# Patient Record
Sex: Male | Born: 1955 | Race: Black or African American | Hispanic: No | Smoking: Former smoker
Health system: Southern US, Community
[De-identification: ages and names within clinical notes are randomized; demographics above are authoritative.]

## PROBLEM LIST (undated history)

## (undated) DIAGNOSIS — I10 Essential (primary) hypertension: Secondary | ICD-10-CM

## (undated) DIAGNOSIS — C3490 Malignant neoplasm of unspecified part of unspecified bronchus or lung: Secondary | ICD-10-CM

## (undated) HISTORY — PX: KNEE SURGERY: SHX244

## (undated) HISTORY — PX: SHOULDER SURGERY: SHX246

## (undated) MED FILL — Acetaminophen Tab 325 MG: ORAL | Qty: 2 | Status: AC

## (undated) MED FILL — Diphenhydramine HCl Cap 25 MG: ORAL | Qty: 1 | Status: AC

## (undated) MED FILL — Dexamethasone Sodium Phosphate Inj 100 MG/10ML: INTRAMUSCULAR | Qty: 1 | Status: AC

---

## 2002-01-14 ENCOUNTER — Emergency Department (HOSPITAL_COMMUNITY): Admission: EM | Admit: 2002-01-14 | Discharge: 2002-01-14 | Payer: Self-pay | Admitting: Emergency Medicine

## 2002-01-14 ENCOUNTER — Encounter: Payer: Self-pay | Admitting: Emergency Medicine

## 2002-08-05 ENCOUNTER — Ambulatory Visit (HOSPITAL_COMMUNITY): Admission: RE | Admit: 2002-08-05 | Discharge: 2002-08-05 | Payer: Self-pay | Admitting: Gastroenterology

## 2002-08-17 ENCOUNTER — Encounter: Admission: RE | Admit: 2002-08-17 | Discharge: 2002-08-17 | Payer: Self-pay | Admitting: Family Medicine

## 2002-08-17 ENCOUNTER — Encounter: Payer: Self-pay | Admitting: Family Medicine

## 2013-06-24 ENCOUNTER — Ambulatory Visit (HOSPITAL_COMMUNITY): Payer: Self-pay | Admitting: Physical Therapy

## 2020-01-20 ENCOUNTER — Ambulatory Visit (HOSPITAL_COMMUNITY)
Admission: EM | Admit: 2020-01-20 | Discharge: 2020-01-20 | Disposition: A | Discharge status: Home / Self Care | Payer: 59 | Attending: Emergency Medicine | Admitting: Emergency Medicine

## 2020-01-20 ENCOUNTER — Other Ambulatory Visit: Payer: Self-pay

## 2020-01-20 ENCOUNTER — Encounter (HOSPITAL_COMMUNITY): Payer: Self-pay

## 2020-01-20 DIAGNOSIS — N390 Urinary tract infection, site not specified: Secondary | ICD-10-CM | POA: Insufficient documentation

## 2020-01-20 HISTORY — DX: Essential (primary) hypertension: I10

## 2020-01-20 LAB — POCT URINALYSIS DIPSTICK, ED / UC
Bilirubin Urine: NEGATIVE
Glucose, UA: NEGATIVE mg/dL
Ketones, ur: NEGATIVE mg/dL
Nitrite: POSITIVE — AB
Protein, ur: 100 mg/dL — AB
Specific Gravity, Urine: >=1.03 (ref 1.005–1.030)
Urobilinogen, UA: 1 mg/dL (ref 0.0–1.0)
pH: 5.5 (ref 5.0–8.0)

## 2020-01-20 MED ORDER — CEPHALEXIN 500 MG PO CAPS: 500.0000 mg | ORAL_CAPSULE | Freq: Three times a day (TID) | ORAL | 0 refills | Status: AC

## 2020-01-20 NOTE — Discharge Instructions (Addendum)
Your urine is consistent with UTI.  Complete course of antibiotics.  Drink plenty of water to empty bladder regularly. Avoid alcohol and caffeine as these may irritate the bladder.   Please follow up with a primary care provider as you may need recheck and further evaluation.  Return or go to the ER for any worsening of symptoms.

## 2020-01-20 NOTE — ED Provider Notes (Signed)
[facility]    CSN: [id number] Arrival date & time: 01/20/20  1155      History   Chief Complaint Chief Complaint  Patient presents with  . urinary issues  . Urinary Frequency    HPI Adam [name] is a 65 y.o. male.   Adam [name] presents with complaints of dysuria and polyuria which started 4 days ago. Has noted maybe sediment to the urine as well. No blood to the urine. No pelvic or back pain. Has feel low grade temps. No gi symptoms. Denies any previous similar. Has an appointment with his pcp in two weeks. Hasn't taken his blood pressure medication.    ROS per HPI, negative if not otherwise mentioned.      Past Medical History:  Diagnosis Date  . Hypertension     There are no problems to display for this patient.   History reviewed. No pertinent surgical history.     Home Medications    Prior to Admission medications   Medication Sig Start Date End Date Taking? Authorizing Provider  cephALEXin (KEFLEX) 500 MG capsule Take 1 capsule (500 mg total) by mouth 3 (three) times daily for 7 days. 01/20/20 01/27/20 Yes Nadalie Laughner, Lanelle Bal B, NP  amLODipine (NORVASC) 10 MG tablet Take 10 mg by mouth daily. 12/14/19   [provider]  lisinopril-hydrochlorothiazide (ZESTORETIC) 20-12.5 MG tablet Take 1 tablet by mouth daily. 12/14/19   [provider]    Family History History reviewed. No pertinent family history.  Social History Social History   Tobacco Use  . Smoking status: Never Smoker  . Smokeless tobacco: Never Used  Substance Use Topics  . Drug use: Never     Allergies   Patient has no known allergies.   Review of Systems Review of Systems   Physical Exam Triage Vital Signs ED Triage Vitals  Enc Vitals Group     BP 01/20/20 1324 (!) 192/112     Pulse Rate 01/20/20 1324 90     Resp 01/20/20 1324 19     Temp 01/20/20 1324 99.6 F (37.6 C)     Temp src --      SpO2 01/20/20 1324 100 %     Weight --      Height --       Head Circumference --      Peak Flow --      Pain Score 01/20/20 1321 0     Pain Loc --      Pain Edu? --      Excl. in [place]? --    No data found.  Updated Vital Signs BP (!) 192/112   Pulse 90   Temp 99.6 F (37.6 C)   Resp 19   SpO2 100%   Visual Acuity Right Eye Distance:   Left Eye Distance:   Bilateral Distance:    Right Eye Near:   Left Eye Near:    Bilateral Near:     Physical Exam Constitutional:      Appearance: He is well-developed.  Cardiovascular:     Rate and Rhythm: Normal rate.  Pulmonary:     Effort: Pulmonary effort is normal.  Abdominal:     Tenderness: There is no abdominal tenderness. There is no right CVA tenderness or left CVA tenderness.  Skin:    General: Skin is warm and dry.  Neurological:     Mental Status: He is alert and oriented to person, place, and time.      UC  Treatments / Results  Labs (all labs ordered are listed, but only abnormal results are displayed) Labs Reviewed  POCT URINALYSIS DIPSTICK, ED / UC - Abnormal; Notable for the following components:      Result Value   Hgb urine dipstick LARGE (*)    Protein, ur 100 (*)    Nitrite POSITIVE (*)    Leukocytes,Ua TRACE (*)    All other components within normal limits  URINE CULTURE    EKG   Radiology No results found.  Procedures Procedures (including critical care time)  Medications Ordered in UC Medications - No data to display  Initial Impression / Assessment and Plan / UC Course  I have reviewed the triage vital signs and the nursing notes.  Pertinent labs & imaging results that were available during my care of the patient were reviewed by me and considered in my medical decision making (see chart for details).     Urine with nitrite and leukocytes, keflex provided with culture obtained and pending. Return precautions provided. Patient verbalized understanding and agreeable to plan.   Final Clinical Impressions(s) / UC Diagnoses   Final  diagnoses:  Lower urinary tract infectious disease     Discharge Instructions     Your urine is consistent with UTI.  Complete course of antibiotics.  Drink plenty of water to empty bladder regularly. Avoid alcohol and caffeine as these may irritate the bladder.   Please follow up with a primary care provider as you may need recheck and further evaluation.  Return or go to the ER for any worsening of symptoms.    ED Prescriptions    Medication Sig Dispense Auth. Provider   cephALEXin (KEFLEX) 500 MG capsule Take 1 capsule (500 mg total) by mouth 3 (three) times daily for 7 days. 21 capsule Zigmund Gottron, NP     PDMP not reviewed this encounter.   Zigmund Gottron, NP 01/20/20 1351

## 2020-01-20 NOTE — ED Triage Notes (Addendum)
Pt in with c/o urinary frequency, urgency and what he thinks is "skin " coming out of his penis when he urinates. Also c/o burning sensation  Also c/o low grade fever of 99.8

## 2020-01-21 LAB — URINE CULTURE

## 2020-01-22 LAB — URINE CULTURE: Culture: >=100000 — AB

## 2020-02-06 ENCOUNTER — Ambulatory Visit (INDEPENDENT_AMBULATORY_CARE_PROVIDER_SITE_OTHER): Payer: 59

## 2020-02-06 ENCOUNTER — Emergency Department (HOSPITAL_COMMUNITY): Payer: 59

## 2020-02-06 ENCOUNTER — Inpatient Hospital Stay (HOSPITAL_COMMUNITY)
Admission: EM | Admit: 2020-02-06 | Discharge: 2020-02-08 | DRG: 181 | Disposition: A | Discharge status: Home / Self Care | Payer: 59 | Attending: Family Medicine | Admitting: Family Medicine

## 2020-02-06 ENCOUNTER — Encounter (HOSPITAL_COMMUNITY): Payer: Self-pay | Admitting: Emergency Medicine

## 2020-02-06 ENCOUNTER — Other Ambulatory Visit: Payer: Self-pay

## 2020-02-06 ENCOUNTER — Ambulatory Visit (HOSPITAL_COMMUNITY)
Admission: EM | Admit: 2020-02-06 | Discharge: 2020-02-06 | Disposition: A | Discharge status: Other Acute Inpatient Hospital | Payer: 59 | Attending: Emergency Medicine | Admitting: Emergency Medicine

## 2020-02-06 DIAGNOSIS — R042 Hemoptysis: Secondary | ICD-10-CM | POA: Diagnosis present

## 2020-02-06 DIAGNOSIS — C3411 Malignant neoplasm of upper lobe, right bronchus or lung: Principal | ICD-10-CM | POA: Diagnosis present

## 2020-02-06 DIAGNOSIS — Z79899 Other long term (current) drug therapy: Secondary | ICD-10-CM

## 2020-02-06 DIAGNOSIS — I10 Essential (primary) hypertension: Secondary | ICD-10-CM

## 2020-02-06 DIAGNOSIS — R9389 Abnormal findings on diagnostic imaging of other specified body structures: Secondary | ICD-10-CM | POA: Diagnosis not present

## 2020-02-06 DIAGNOSIS — Z8249 Family history of ischemic heart disease and other diseases of the circulatory system: Secondary | ICD-10-CM

## 2020-02-06 DIAGNOSIS — Z9889 Other specified postprocedural states: Secondary | ICD-10-CM

## 2020-02-06 DIAGNOSIS — R059 Cough, unspecified: Secondary | ICD-10-CM | POA: Diagnosis not present

## 2020-02-06 DIAGNOSIS — Z20822 Contact with and (suspected) exposure to covid-19: Secondary | ICD-10-CM | POA: Diagnosis present

## 2020-02-06 DIAGNOSIS — Z87891 Personal history of nicotine dependence: Secondary | ICD-10-CM

## 2020-02-06 DIAGNOSIS — R918 Other nonspecific abnormal finding of lung field: Secondary | ICD-10-CM

## 2020-02-06 DIAGNOSIS — R093 Abnormal sputum: Secondary | ICD-10-CM

## 2020-02-06 LAB — TROPONIN I (HIGH SENSITIVITY)
Troponin I (High Sensitivity): 3 ng/L (ref <18)
Troponin I (High Sensitivity): 4 ng/L (ref <18)

## 2020-02-06 LAB — BASIC METABOLIC PANEL
Anion gap: 11 (ref 5–15)
BUN: 12 mg/dL (ref 8–23)
CO2: 25 mmol/L (ref 22–32)
Calcium: 9.8 mg/dL (ref 8.9–10.3)
Chloride: 101 mmol/L (ref 98–111)
Creatinine, Ser: 1.01 mg/dL (ref 0.61–1.24)
GFR, Estimated: >60 mL/min (ref >60)
Glucose, Bld: 101 mg/dL — ABNORMAL HIGH (ref 70–99)
Potassium: 4 mmol/L (ref 3.5–5.1)
Sodium: 137 mmol/L (ref 135–145)

## 2020-02-06 LAB — CBC
HCT: 42.3 % (ref 39.0–52.0)
Hemoglobin: 13.8 g/dL (ref 13.0–17.0)
MCH: 26.8 pg (ref 26.0–34.0)
MCHC: 32.6 g/dL (ref 30.0–36.0)
MCV: 82.1 fL (ref 80.0–100.0)
Platelets: 354 10*3/uL (ref 150–400)
RBC: 5.15 MIL/uL (ref 4.22–5.81)
RDW: 14.2 % (ref 11.5–15.5)
WBC: 8.5 10*3/uL (ref 4.0–10.5)
nRBC: 0 % (ref 0.0–0.2)

## 2020-02-06 MED ORDER — ACETAMINOPHEN 650 MG RE SUPP: 650.0000 mg | Freq: Four times a day (QID) | RECTAL | Status: DC | PRN

## 2020-02-06 MED ORDER — POLYETHYLENE GLYCOL 3350 17 G PO PACK: 17.0000 g | PACK | Freq: Every day | ORAL | Status: DC | PRN

## 2020-02-06 MED ORDER — ACETAMINOPHEN 325 MG PO TABS: 650.0000 mg | ORAL_TABLET | Freq: Four times a day (QID) | ORAL | Status: DC | PRN

## 2020-02-06 MED ORDER — IOHEXOL 300 MG/ML  SOLN
75.0000 mL | Freq: Once | INTRAMUSCULAR | Status: AC | PRN
  Administered 2020-02-06: 75 mL via INTRAVENOUS

## 2020-02-06 MED ORDER — LISINOPRIL 20 MG PO TABS
20.0000 mg | ORAL_TABLET | Freq: Every day | ORAL | Status: DC
  Administered 2020-02-07: 20 mg via ORAL
  Filled 2020-02-06 (×2): qty 1

## 2020-02-06 MED ORDER — HYDROCHLOROTHIAZIDE 12.5 MG PO CAPS: 12.5000 mg | ORAL_CAPSULE | Freq: Every day | ORAL | Status: DC

## 2020-02-06 MED ORDER — AMLODIPINE BESYLATE 10 MG PO TABS
10.0000 mg | ORAL_TABLET | Freq: Every day | ORAL | Status: DC
  Administered 2020-02-07: 10 mg via ORAL
  Filled 2020-02-06 (×2): qty 1

## 2020-02-06 MED ORDER — LISINOPRIL-HYDROCHLOROTHIAZIDE 20-12.5 MG PO TABS: 1.0000 | ORAL_TABLET | Freq: Every day | ORAL | Status: DC

## 2020-02-06 NOTE — ED Notes (Signed)
Pt to CT scanner

## 2020-02-06 NOTE — ED Provider Notes (Signed)
[facility] EMERGENCY DEPARTMENT Provider Note   CSN: [id number] Arrival date & time: 02/06/20  1522     History Chief Complaint  Patient presents with  . Hemoptysis    Adam [name] is a 65 y.o. male.  The history is provided by the patient.  Cough Cough characteristics:  Productive Sputum characteristics:  Pink and bloody Severity:  Mild Onset quality:  Gradual Duration:  12 weeks Timing:  Intermittent Progression:  Waxing and waning Chronicity:  Recurrent Smoker: yes   Context: not sick contacts   Relieved by:  Nothing Worsened by:  Nothing Ineffective treatments:  None tried Associated symptoms: no chest pain, no chills, no ear pain, no fever, no rash, no shortness of breath and no sore throat        Past Medical History:  Diagnosis Date  . Hypertension     Patient Active Problem List   Diagnosis Date Noted  . Hemoptysis 02/06/2020  . Mass of right lung 02/06/2020  . HTN (hypertension) 02/06/2020    History reviewed. No pertinent surgical history.     Family History  Problem Relation Age of Onset  . Heart disease Mother     Social History   Tobacco Use  . Smoking status: Former Smoker    Packs/day: 1.00    Years: 20.00    Pack years: 20.00    Quit date: 06/09/2018    Years since quitting: 1.6  . Smokeless tobacco: Never Used  Substance Use Topics  . Drug use: Never    Home Medications Prior to Admission medications   Medication Sig Start Date End Date Taking? Authorizing Provider  amLODipine (NORVASC) 10 MG tablet Take 10 mg by mouth daily. 12/14/19  Yes [provider]  lisinopril-hydrochlorothiazide (ZESTORETIC) 20-12.5 MG tablet Take 1 tablet by mouth daily. 12/14/19  Yes [provider]    Allergies    Patient has no known allergies.  Review of Systems   Review of Systems  Constitutional: Negative for chills and fever.  HENT: Negative for ear pain and sore throat.   Eyes: Negative for pain and  visual disturbance.  Respiratory: Positive for cough. Negative for shortness of breath.   Cardiovascular: Negative for chest pain and palpitations.  Gastrointestinal: Negative for abdominal pain and vomiting.  Genitourinary: Negative for dysuria and hematuria.  Musculoskeletal: Negative for arthralgias and back pain.  Skin: Negative for color change and rash.  Neurological: Negative for seizures and syncope.  All other systems reviewed and are negative.   Physical Exam Updated Vital Signs BP 118/83 (BP Location: Right Arm)   Pulse 78   Temp 99 F (37.2 C) (Oral)   Resp (!) 22   SpO2 98%   Physical Exam Vitals and nursing note reviewed.  Constitutional:      Appearance: He is well-developed and well-nourished.  HENT:     Head: Normocephalic and atraumatic.  Eyes:     Conjunctiva/sclera: Conjunctivae normal.  Cardiovascular:     Rate and Rhythm: Normal rate and regular rhythm.     Heart sounds: No murmur heard.   Pulmonary:     Effort: Pulmonary effort is normal. No respiratory distress.     Breath sounds: Normal breath sounds.  Abdominal:     Palpations: Abdomen is soft.     Tenderness: There is no abdominal tenderness.  Musculoskeletal:        General: No edema.     Cervical back: Neck supple.  Skin:    General: Skin is  warm and dry.  Neurological:     Mental Status: He is alert.  Psychiatric:        Mood and Affect: Mood and affect normal.     ED Results / Procedures / Treatments   Labs (all labs ordered are listed, but only abnormal results are displayed) Labs Reviewed  BASIC METABOLIC PANEL - Abnormal; Notable for the following components:      Result Value   Glucose, Bld 101 (*)    All other components within normal limits  COMPREHENSIVE METABOLIC PANEL - Abnormal; Notable for the following components:   Glucose, Bld 112 (*)    Albumin 3.2 (*)    All other components within normal limits  SARS CORONAVIRUS 2 (TAT 6-24 HRS)  CBC  CBC  HIV ANTIBODY  (ROUTINE TESTING W REFLEX)  TROPONIN I (HIGH SENSITIVITY)  TROPONIN I (HIGH SENSITIVITY)    EKG None  Radiology DG Chest 2 View  Result Date: 02/06/2020 CLINICAL DATA:  Shortness of breath with exertion. Hemoptysis last night. Ex-smoker. EXAM: CHEST - 2 VIEW COMPARISON:  Earlier today. FINDINGS: Again demonstrated is a large suprahilar/mediastinal mass with enlargement of the adjacent superior right hilum. A small amount of patchy and linear density in the right upper lobe is unchanged. Clear left lung. Normal sized heart. Tortuous and partially calcified thoracic aorta. Thoracic spine degenerative changes. IMPRESSION: 1. Stable large right suprahilar/mediastinal mass with enlargement of the adjacent superior right hilum. This remains concerning for malignancy. A follow-up chest CT with contrast is recommended. 2. Stable mild right upper lobe scarring/bullous changes and possible pneumonia or postobstructive changes. Electronically Signed   By: Claudie Revering M.D.   On: 02/06/2020 16:16   DG Chest 2 View  Result Date: 02/06/2020 CLINICAL DATA:  65 year old male with a history of cough and bloody sputum EXAM: CHEST - 2 VIEW COMPARISON:  None available FINDINGS: Cardiac diameter within normal limits. Lobular density in the right suprahilar region, with no comparison. This corresponds to opacity in the hilar and suprahilar region on the lateral view. Estimated diameter 8.4 cm. Patchy opacity peripheral to the hilar density in the right upper lobe with thickening of the minor fissure. No pleural effusion. No pneumothorax. IMPRESSION: Right suprahilar/mediastinal mass, concerning for malignancy, or less likely vascular pathology. Contrast-enhanced chest CT is recommended Electronically Signed   By: Corrie Mckusick D.O.   On: 02/06/2020 14:57   CT Chest W Contrast  Result Date: 02/06/2020 CLINICAL DATA:  Hemoptysis with right-sided suprahilar/mediastinal mass EXAM: CT CHEST WITH CONTRAST TECHNIQUE:  Multidetector CT imaging of the chest was performed during intravenous contrast administration. CONTRAST:  74mL OMNIPAQUE IOHEXOL 300 MG/ML  SOLN COMPARISON:  Chest x-ray from earlier in the same day. FINDINGS: Cardiovascular: Thoracic aorta shows a normal branching pattern. Scattered atherosclerotic calcifications are noted. Coronary calcifications are seen. The heart is not significantly enlarged. Pulmonary artery as visualized is within normal limits. Mediastinum/Nodes: Thoracic inlet is unremarkable. There is a large centrally necrotic mass lesion arising in the right suprahilar region and extending into the mediastinum adjacent to the trachea consistent with lymphadenopathy. This measures approximately 8.9 x 7.4 cm in greatest transverse and AP dimensions. This extends for at least 7.8 cm in craniocaudad projection. Extension into the right hilum is seen. Additionally in the right upper lobe there is a associated mass lesion which measures 2.9 x 2.6 cm. No sizable left hilar adenopathy is noted. The esophagus as visualized is within normal limits. Lungs/Pleura: Lungs are well aerated bilaterally and demonstrate some  mild emphysematous changes. The left lung is clear. The right lung as previously described demonstrates a spiculated 2.9 x 2.6 cm mass lesion in the upper lobe consistent with a primary pulmonary neoplasm till proven otherwise. This radiates to the right suprahilar adenopathy mass. No sizable effusion is seen. No other parenchymal nodule is noted. Upper Abdomen: Visualized upper abdomen demonstrates a 9 mm non-obstructing left renal stone. Adrenal glands are within normal limits. Vague hypodensity is noted within the liver consistent with a small cyst. No definitive metastatic lesions are seen. Musculoskeletal: Degenerative changes of the thoracic spine are seen. IMPRESSION: Right upper lobe mass lesion with associated large nodal mass involving the right suprahilar region and mediastinum in the  paratracheal region consistent with primary pulmonary neoplasm and metastatic nodal involvement. Further workup to include tissue sampling is recommended. Nonobstructing left renal stone. Aortic Atherosclerosis (ICD10-I70.0) and Emphysema (ICD10-J43.9). Electronically Signed   By: Inez Catalina M.D.   On: 02/06/2020 20:58   MR Brain W and Wo Contrast  Result Date: 02/07/2020 CLINICAL DATA:  Initial evaluation for metastatic disease. EXAM: MRI HEAD WITHOUT AND WITH CONTRAST TECHNIQUE: Multiplanar, multiecho pulse sequences of the brain and surrounding structures were obtained without and with intravenous contrast. CONTRAST:  11mL GADAVIST GADOBUTROL 1 MMOL/ML IV SOLN COMPARISON:  None. FINDINGS: Brain: Mild age-related cerebral atrophy. Patchy and confluent T2/FLAIR hyperintensity within the periventricular and deep white matter both cerebral hemispheres, most consistent with chronic small vessel ischemic disease, mild in nature. Superimposed remote lacunar infarct present at the right basal ganglia. Associated chronic hemosiderin staining. No abnormal foci of restricted diffusion to suggest acute or subacute ischemia. Gray-white matter differentiation maintained. No encephalomalacia to suggest chronic cortical infarction elsewhere within the brain. No acute intracranial hemorrhage. Single punctate focus of susceptibility artifact noted within the central pons (series 14, image 20). Additional faint nodular enhancement seen at this level (series 18, image 20). Finding favored to reflect a small vascular structure. No other foci of susceptibility artifact to suggest chronic intracranial hemorrhage. No mass lesion, midline shift, or mass effect. No hydrocephalus or extra-axial fluid collection. Pituitary gland suprasellar region within normal limits. Midline structures intact and normal. No other abnormal enhancement or evidence for intracranial metastatic disease. Vascular: Major intracranial vascular flow voids are  maintained. Skull and upper cervical spine: Craniocervical junction within normal limits. Bone marrow signal intensity normal. No focal marrow replacing lesion. Scalp soft tissues within normal limits. Sinuses/Orbits: Globes and orbital soft tissues within normal limits. Paranasal sinuses are largely clear. No mastoid effusion. Inner ear structures grossly normal. Other: None. IMPRESSION: 1. Single punctate focus of susceptibility artifact and faint nodular enhancement within the central pons as above. Finding favored to reflect a small vascular structure. Short interval follow-up MRI in 3 months suggested to document stability. 2. No other MRI evidence for intracranial metastatic disease. 3. 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 Electronically Signed   By: Jeannine Boga M.D.   On: 02/07/2020 02:55    Procedures Procedures   Medications Ordered in ED Medications  amLODipine (NORVASC) tablet 10 mg (has no administration in time range)  acetaminophen (TYLENOL) tablet 650 mg (has no administration in time range)    Or  acetaminophen (TYLENOL) suppository 650 mg (has no administration in time range)  polyethylene glycol (MIRALAX / GLYCOLAX) packet 17 g (has no administration in time range)  lisinopril (ZESTRIL) tablet 20 mg (has no administration in time range)  iohexol (OMNIPAQUE) 300 MG/ML solution 75 mL (75 mLs Intravenous Contrast Given 02/06/20 2039)  gadobutrol (GADAVIST) 1 MMOL/ML injection 9 mL (9 mLs Intravenous Contrast Given 02/07/20 0134)    ED Course  I have reviewed the triage vital signs and the nursing notes.  Pertinent labs & imaging results that were available during my care of the patient were reviewed by me and considered in my medical decision making (see chart for details).    MDM Rules/Calculators/A&P                          This is a 65 year old male with past  medical history of hypertension and former smoker greater than 20 pack years who quit a few years ago, who presents emergency department for abnormal chest x-ray.  Patient reports about 3 months ago he had a rare episode where he coughed up a small dime sized piece of bloody/pink thick mucus, this resolved after the one time and then recurred a few weeks later, again resolved, and then recurred today.  Patient went to an urgent care, chest x-ray was obtained that showed a right apical mass concerning for malignancy and he was therefore sent to the ED.  On arrival he is afebrile, hemodynamically stable, no acute distress.  His lung sounds are actually clear bilaterally and he has no increased work of breathing.  His oxygen saturation is 100% on room air.  Further history obtained, he is not had any night sweats, but does endorse some unintentional weight loss that he thought was secondary to starting a blood pressure medication.  He has not had any fevers or other infectious type symptoms.  He has only had a total of 3 of the scant one-time episodes of dime sized bloody/pink thick sputum, none currently.  I did inform him of the concern that the abnormality on chest x-ray was likely cancer and we will be further evaluated for that.  Chest x-ray here in the emergency department again shows a large right suprahilar mediastinal mass with enlargement of the adjacent superior right hilum concerning for malignancy.  CT of the chest with contrast showed Right upper lobe mass lesion with associated large nodal mass involving the right suprahilar region and mediastinum in the paratracheal region consistent with primary pulmonary neoplasm and metastatic nodal involvement. Further workup to include tissue sampling is recommended.  I spoke wit Dr. Chryl Heck, Hematology-Oncology; although patient is stable and could likely benefit from outpatient work-up. My concern is that this episode of hemoptysis, albeit scant and rare,  could be sentinel event for disastrous pulmonary hemorrhage given the location of the mass on the trachea/R main stem bronchus. Also f/u is concerning as patient does not have a PCP and tissue biopsy/samploing could be delayed outpatient. We will plan to admit him for further work-up and malignancy evaluation.   Final Clinical Impression(s) / ED Diagnoses Final diagnoses:  Hemoptysis  Mass of right lung    Rx / DC Orders ED Discharge Orders    None       Camila Li, MD 02/07/20 [id number]    Carmin Muskrat, MD 02/07/20 1739

## 2020-02-06 NOTE — Discharge Instructions (Addendum)
Go to the Emergency Department for evaluation of your coughing up blood and abnormal chest xray.

## 2020-02-06 NOTE — ED Triage Notes (Signed)
Pt presents today with concern of coughing up a pea size of dark red mucous yesterday. He reports continuing to cough, no vomiting.

## 2020-02-06 NOTE — H&P (Signed)
History and Physical   Adam [name] QMV:[id number] DOB: June 23, 1955 DOA: 02/06/2020  PCP: Adam [name]   Patient coming from: Home  Chief Complaint: Hemoptysis  HPI: Adam [name] is a 65 y.o. male with medical history significant of hypertension and tobacco use who presents with 1 day of multiple episodes of cough with bloody sputum.  Adam [name] states that he first noticed some bloody sputum about 2 months ago or so.  He states this was 3 weeks after having some teeth pulled and he thought something had been dropped into his throat and that he coughed up.  He then had 2 more episodes the first 1 was a couple weeks after the first and then the most recent one was yesterday which caused him to present to urgent care today.  He describes the bloody sputum as being about pea-sized. He has a history of 20 pack years of smoking did quit 1-1/2 years ago. He denies fevers, chills, chest pain, shortness of breath, weight loss. He does not currently follow with PCP.  ED Course: Vital signs in the ED significant for blood pressure in the 284X to 324 systolic. Otherwise stable. Lab work-up showed normal BMP and CBC. Troponin normal x2. Respiratory panel for Covid pending. Chest x-ray showed right hilar/mediastinal mass concerning for malignancy. CT chest showed right upper lobe lesion with large nodal mass involving the right suprahilar and mediastinal area with concern for primary pulmonary neoplasm. Oncology was consulted in the ED who recommend admission for further evaluation and staging.  Review of Systems: As [name] HPI otherwise all other systems reviewed and are negative.  Past Medical History:  Diagnosis Date  . Hypertension     History reviewed. No pertinent surgical history.  Social History  reports that he quit smoking about 19 months ago. He has a 20.00 pack-year smoking history. He has never used smokeless tobacco. He reports that he does not use drugs. No history on file for alcohol  use.  No Known Allergies  Family History  Problem Relation Age of Onset  . Heart disease Mother    Reviewed on admission  Prior to Admission medications   Medication Sig Start Date End Date Taking? Authorizing Provider  amLODipine (NORVASC) 10 MG tablet Take 10 mg by mouth daily. 12/14/19  Yes [provider]  lisinopril-hydrochlorothiazide (ZESTORETIC) 20-12.5 MG tablet Take 1 tablet by mouth daily. 12/14/19  Yes [provider]    Physical Exam: Vitals:   02/06/20 2015 02/06/20 2045 02/06/20 2100 02/06/20 2115  BP: (!) 141/92 (!) 162/116 140/90 (!) 154/103  Pulse: 83 (!) 102 86 86  Resp: 18 (!) 21 15 (!) 21  Temp:      TempSrc:      SpO2: 100% 100% 99% 100%   Physical Exam Constitutional:      General: He is not in acute distress.    Appearance: Normal appearance.  HENT:     Head: Normocephalic and atraumatic.     Mouth/Throat:     Mouth: Mucous membranes are moist.     Pharynx: Oropharynx is clear.  Eyes:     Extraocular Movements: Extraocular movements intact.     Pupils: Pupils are equal, round, and reactive to light.  Cardiovascular:     Rate and Rhythm: Normal rate and regular rhythm.     Pulses: Normal pulses.     Heart sounds: Normal heart sounds.  Pulmonary:     Effort: Pulmonary effort is normal. No respiratory distress.  Breath sounds: Normal breath sounds.  Abdominal:     General: Bowel sounds are normal. There is no distension.     Palpations: Abdomen is soft.     Tenderness: There is no abdominal tenderness.  Musculoskeletal:        General: No swelling or deformity.  Skin:    General: Skin is warm and dry.  Neurological:     General: No focal deficit present.     Mental Status: Mental status is at baseline.    Labs on Admission: I have personally reviewed following labs and imaging studies  CBC: Recent Labs  Lab 02/06/20 1600  WBC 8.5  HGB 13.8  HCT 42.3  MCV 82.1  PLT 371    Basic Metabolic Panel: Recent Labs   Lab 02/06/20 1600  NA 137  K 4.0  CL 101  CO2 25  GLUCOSE 101*  BUN 12  CREATININE 1.01  CALCIUM 9.8    GFR: Estimated Creatinine Clearance: 75.8 mL/min (by C-G formula based on SCr of 1.01 mg/dL).  Liver Function Tests: No results for input(s): AST, ALT, ALKPHOS, BILITOT, PROT, ALBUMIN in the last 168 hours.  Urine analysis:    Component Value Date/Time   LABSPEC >=1.030 01/20/2020 1330   PHURINE 5.5 01/20/2020 1330   GLUCOSEU NEGATIVE 01/20/2020 1330   HGBUR LARGE (A) 01/20/2020 1330   BILIRUBINUR NEGATIVE 01/20/2020 1330   KETONESUR NEGATIVE 01/20/2020 1330   PROTEINUR 100 (A) 01/20/2020 1330   UROBILINOGEN 1.0 01/20/2020 1330   NITRITE POSITIVE (A) 01/20/2020 1330   LEUKOCYTESUR TRACE (A) 01/20/2020 1330    Radiological Exams on Admission: DG Chest 2 View  Result Date: 02/06/2020 CLINICAL DATA:  Shortness of breath with exertion. Hemoptysis last night. Ex-smoker. EXAM: CHEST - 2 VIEW COMPARISON:  Earlier today. FINDINGS: Again demonstrated is a large suprahilar/mediastinal mass with enlargement of the adjacent superior right hilum. A small amount of patchy and linear density in the right upper lobe is unchanged. Clear left lung. Normal sized heart. Tortuous and partially calcified thoracic aorta. Thoracic spine degenerative changes. IMPRESSION: 1. Stable large right suprahilar/mediastinal mass with enlargement of the adjacent superior right hilum. This remains concerning for malignancy. A follow-up chest CT with contrast is recommended. 2. Stable mild right upper lobe scarring/bullous changes and possible pneumonia or postobstructive changes. Electronically Signed   By: Adam Revering M.D.   On: 02/06/2020 16:16   DG Chest 2 View  Result Date: 02/06/2020 CLINICAL DATA:  65 year old male with a history of cough and bloody sputum EXAM: CHEST - 2 VIEW COMPARISON:  None available FINDINGS: Cardiac diameter within normal limits. Lobular density in the right suprahilar region,  with no comparison. This corresponds to opacity in the hilar and suprahilar region on the lateral view. Estimated diameter 8.4 cm. Patchy opacity peripheral to the hilar density in the right upper lobe with thickening of the minor fissure. No pleural effusion. No pneumothorax. IMPRESSION: Right suprahilar/mediastinal mass, concerning for malignancy, or less likely vascular pathology. Contrast-enhanced chest CT is recommended Electronically Signed   By: Corrie Mckusick D.O.   On: 02/06/2020 14:57   CT Chest W Contrast  Result Date: 02/06/2020 CLINICAL DATA:  Hemoptysis with right-sided suprahilar/mediastinal mass EXAM: CT CHEST WITH CONTRAST TECHNIQUE: Multidetector CT imaging of the chest was performed during intravenous contrast administration. CONTRAST:  39mL OMNIPAQUE IOHEXOL 300 MG/ML  SOLN COMPARISON:  Chest x-ray from earlier in the same day. FINDINGS: Cardiovascular: Thoracic aorta shows a normal branching pattern. Scattered atherosclerotic calcifications are noted. Coronary  calcifications are seen. The heart is not significantly enlarged. Pulmonary artery as visualized is within normal limits. Mediastinum/Nodes: Thoracic inlet is unremarkable. There is a large centrally necrotic mass lesion arising in the right suprahilar region and extending into the mediastinum adjacent to the trachea consistent with lymphadenopathy. This measures approximately 8.9 x 7.4 cm in greatest transverse and AP dimensions. This extends for at least 7.8 cm in craniocaudad projection. Extension into the right hilum is seen. Additionally in the right upper lobe there is a associated mass lesion which measures 2.9 x 2.6 cm. No sizable left hilar adenopathy is noted. The esophagus as visualized is within normal limits. Lungs/Pleura: Lungs are well aerated bilaterally and demonstrate some mild emphysematous changes. The left lung is clear. The right lung as previously described demonstrates a spiculated 2.9 x 2.6 cm mass lesion in the  upper lobe consistent with a primary pulmonary neoplasm till proven otherwise. This radiates to the right suprahilar adenopathy mass. No sizable effusion is seen. No other parenchymal nodule is noted. Upper Abdomen: Visualized upper abdomen demonstrates a 9 mm non-obstructing left renal stone. Adrenal glands are within normal limits. Vague hypodensity is noted within the liver consistent with a small cyst. No definitive metastatic lesions are seen. Musculoskeletal: Degenerative changes of the thoracic spine are seen. IMPRESSION: Right upper lobe mass lesion with associated large nodal mass involving the right suprahilar region and mediastinum in the paratracheal region consistent with primary pulmonary neoplasm and metastatic nodal involvement. Further workup to include tissue sampling is recommended. Nonobstructing left renal stone. Aortic Atherosclerosis (ICD10-I70.0) and Emphysema (ICD10-J43.9). Electronically Signed   By: Inez Catalina M.D.   On: 02/06/2020 20:58   EKG: Independently reviewed. Normal sinus rhythm at 97 bpm. Incomplete right bundle branch block with RSR prime and QRS duration of 100.  Assessment/Plan Principal Problem:   Hemoptysis Active Problems:   Mass of right lung   HTN (hypertension)  Hemoptysis Lung mass > Patient presents with 1 day of several episodes of cough with bloody sputum which were pea-sized > Chest x-ray and CT revealed right upper lobe lesion with large nodal mass involving the suprahilar and mediastinal region concerning for a primary pulmonary neoplasm > Case discussed with oncology by EDP who will consult on the patient tomorrow and have advised patient be admitted for work-up due to concern for continued bleeding and location [place] mainstem as well as patient not having a PCP for close follow-up. - Appreciate oncology's assistance with this patient - We will likely need consults for tissue sampling suspect pulmonary - Admit to MedSurg, with continuous  pulse ox - MRI brain and CT abdomen pelvis to evaluate for mets  Hypertension > BP in the 140s to 170s in the ED - Continue home amlodipine and lisinopril-hydrochlorothiazide combo  DVT prophylaxis: SCDs, concern for rebleeding into right mainstem bronchus.  Code Status:   Full Family Communication:  None on admission Disposition Plan:   Patient is from:  Home  Anticipated DC to:  Home  Anticipated DC date:  1 to 3 days  Anticipated DC barriers: None  Consults called:  Oncology consulted by ED Admission status:  Observation, MedSurg   Severity of Illness: The appropriate patient status for this patient is OBSERVATION. Observation status is judged to be reasonable and necessary in order to provide the required intensity of service to ensure the patient's safety. The patient's presenting symptoms, physical exam findings, and initial radiographic and laboratory data in the context of their medical condition is felt to  place them at decreased risk for further clinical deterioration. Furthermore, it is anticipated that the patient will be medically stable for discharge from the hospital within 2 midnights of admission. The following factors support the patient status of observation.   " The patient's presenting symptoms include hemoptysis. " The physical exam findings include stable findings. " The initial radiographic and laboratory data are concerning for right upper lobe mass suspected primary pulmonary neoplasm.   Marcelyn Bruins MD [facility]  How to contact the [facility] Attending or Consulting provider [place] or covering provider during after hours [place], for this patient?   1. Check the care team in [facility] and look for a) attending/consulting [facility] provider listed and b) the [facility] team listed 2. Log into [email or web address] and use [place]'s universal password to access. If you do not have the password, please contact the hospital operator. 3. Locate the [facility] provider you are looking  for under [facility] and page to a number that you can be directly reached. 4. If you still have difficulty reaching the provider, please page the [facility] (Director on Call) for the Hospitalists listed on amion for assistance.  02/06/2020, 10:51 PM

## 2020-02-06 NOTE — ED Triage Notes (Signed)
Pt states he coughed up a pea size amount of dark red blood yesterday.  Denies pain.  Reports SOB with exertion.

## 2020-02-06 NOTE — ED Provider Notes (Signed)
[place]    CSN: [id number] Arrival date & time: 02/06/20  1332      History   Chief Complaint Chief Complaint  Patient presents with  . Hemoptysis    HPI Adam [name] is a 65 y.o. male.   Patient presents with cough productive of bloody sputum yesterday.  He reports it was a "pea-sized" amount of blood one time yesterday.  He states this is happened 3 or 4 times in the past 4 months.  He is a former smoker; 20-year history of 1 ppd; quit 1.5 years ago.  He denies fever, chills, chest pain, shortness of breath, unexplained weight loss, or other symptoms.  His medical history includes HTN.  He reports he does not currently have a PCP.  The history is provided by the patient and medical records.    Past Medical History:  Diagnosis Date  . Hypertension     There are no problems to display for this patient.   History reviewed. No pertinent surgical history.     Home Medications    Prior to Admission medications   Medication Sig Start Date End Date Taking? Authorizing Provider  amLODipine (NORVASC) 10 MG tablet Take 10 mg by mouth daily. 12/14/19  Yes [provider]  lisinopril-hydrochlorothiazide (ZESTORETIC) 20-12.5 MG tablet Take 1 tablet by mouth daily. 12/14/19  Yes [provider]    Family History History reviewed. No pertinent family history.  Social History Social History   Tobacco Use  . Smoking status: Former Smoker    Packs/day: 1.00    Years: 20.00    Pack years: 20.00    Quit date: 06/09/2018    Years since quitting: 1.6  . Smokeless tobacco: Never Used  Substance Use Topics  . Drug use: Never     Allergies   Patient has no known allergies.   Review of Systems Review of Systems  Constitutional: Negative for chills, fever and unexpected weight change.  HENT: Negative for ear pain and sore throat.   Eyes: Negative for pain and visual disturbance.  Respiratory: Positive for cough. Negative for shortness of  breath.        Hemoptysis  Cardiovascular: Negative for chest pain and palpitations.  Gastrointestinal: Negative for abdominal pain and vomiting.  Genitourinary: Negative for dysuria and hematuria.  Musculoskeletal: Negative for arthralgias and back pain.  Skin: Negative for color change and rash.  Neurological: Negative for seizures and syncope.  All other systems reviewed and are negative.    Physical Exam Triage Vital Signs ED Triage Vitals  Enc Vitals Group     BP      Pulse      Resp      Temp      Temp src      SpO2      Weight      Height      Head Circumference      Peak Flow      Pain Score      Pain Loc      Pain Edu?      Excl. in [place]?    No data found.  Updated Vital Signs BP (!) 143/86 (BP Location: Left Arm)   Pulse (!) 101   Temp 98.2 F (36.8 C) (Oral)   Resp 20   Ht 5\' 9"  (1.753 m)   Wt 186 lb (84.4 kg)   SpO2 99%   BMI 27.47 kg/m   Visual Acuity Right Eye Distance:   Left  Eye Distance:   Bilateral Distance:    Right Eye Near:   Left Eye Near:    Bilateral Near:     Physical Exam Vitals and nursing note reviewed.  Constitutional:      General: He is not in acute distress.    Appearance: He is well-developed and well-nourished. He is not ill-appearing.  HENT:     Head: Normocephalic and atraumatic.     Right Ear: Tympanic membrane normal.     Left Ear: Tympanic membrane normal.     Nose: Nose normal.     Mouth/Throat:     Mouth: Mucous membranes are moist.     Pharynx: Oropharynx is clear.  Eyes:     Conjunctiva/sclera: Conjunctivae normal.  Cardiovascular:     Rate and Rhythm: Normal rate and regular rhythm.     Heart sounds: Normal heart sounds.  Pulmonary:     Effort: Pulmonary effort is normal. No respiratory distress.     Breath sounds: Normal breath sounds. No wheezing, rhonchi or rales.  Abdominal:     Palpations: Abdomen is soft.     Tenderness: There is no abdominal tenderness. There is no guarding or rebound.   Musculoskeletal:        General: No edema.     Cervical back: Neck supple.  Skin:    General: Skin is warm and dry.  Neurological:     General: No focal deficit present.     Mental Status: He is alert and oriented to person, place, and time.     Gait: Gait normal.  Psychiatric:        Mood and Affect: Mood and affect and mood normal.        Behavior: Behavior normal.      UC Treatments / Results  Labs (all labs ordered are listed, but only abnormal results are displayed) Labs Reviewed - No data to display  EKG   Radiology DG Chest 2 View  Result Date: 02/06/2020 CLINICAL DATA:  65 year old male with a history of cough and bloody sputum EXAM: CHEST - 2 VIEW COMPARISON:  None available FINDINGS: Cardiac diameter within normal limits. Lobular density in the right suprahilar region, with no comparison. This corresponds to opacity in the hilar and suprahilar region on the lateral view. Estimated diameter 8.4 cm. Patchy opacity peripheral to the hilar density in the right upper lobe with thickening of the minor fissure. No pleural effusion. No pneumothorax. IMPRESSION: Right suprahilar/mediastinal mass, concerning for malignancy, or less likely vascular pathology. Contrast-enhanced chest CT is recommended Electronically Signed   By: Corrie Mckusick D.O.   On: 02/06/2020 14:57    Procedures Procedures (including critical care time)  Medications Ordered in UC Medications - No data to display  Initial Impression / Assessment and Plan / UC Course  I have reviewed the triage vital signs and the nursing notes.  Pertinent labs & imaging results that were available during my care of the patient were reviewed by me and considered in my medical decision making (see chart for details).   Hemoptysis, abnormal chest x-ray.  Chest x-ray shows right lung mass concerning for malignancy; contrast-enhanced chest CT recommended.  Patient does not have a PCP.  Sending him to the ED for  evaluation.   Final Clinical Impressions(s) / UC Diagnoses   Final diagnoses:  Hemoptysis  Abnormal chest x-ray     Discharge Instructions     Go to the Emergency Department for evaluation of your coughing up blood and abnormal chest  xray.        ED Prescriptions    None     PDMP not reviewed this encounter.   Sharion Balloon, NP 02/06/20 [phone]

## 2020-02-07 ENCOUNTER — Observation Stay (HOSPITAL_COMMUNITY): Payer: 59

## 2020-02-07 ENCOUNTER — Encounter (HOSPITAL_COMMUNITY): Payer: Self-pay | Admitting: Internal Medicine

## 2020-02-07 DIAGNOSIS — Z20822 Contact with and (suspected) exposure to covid-19: Secondary | ICD-10-CM | POA: Diagnosis present

## 2020-02-07 DIAGNOSIS — Z8249 Family history of ischemic heart disease and other diseases of the circulatory system: Secondary | ICD-10-CM | POA: Diagnosis not present

## 2020-02-07 DIAGNOSIS — I10 Essential (primary) hypertension: Secondary | ICD-10-CM | POA: Diagnosis present

## 2020-02-07 DIAGNOSIS — Z87891 Personal history of nicotine dependence: Secondary | ICD-10-CM | POA: Diagnosis not present

## 2020-02-07 DIAGNOSIS — Z79899 Other long term (current) drug therapy: Secondary | ICD-10-CM | POA: Diagnosis not present

## 2020-02-07 DIAGNOSIS — R042 Hemoptysis: Secondary | ICD-10-CM | POA: Diagnosis present

## 2020-02-07 DIAGNOSIS — C3411 Malignant neoplasm of upper lobe, right bronchus or lung: Secondary | ICD-10-CM | POA: Diagnosis present

## 2020-02-07 LAB — CBC
HCT: 41.8 % (ref 39.0–52.0)
Hemoglobin: 13.1 g/dL (ref 13.0–17.0)
MCH: 26.4 pg (ref 26.0–34.0)
MCHC: 31.3 g/dL (ref 30.0–36.0)
MCV: 84.1 fL (ref 80.0–100.0)
Platelets: 346 10*3/uL (ref 150–400)
RBC: 4.97 MIL/uL (ref 4.22–5.81)
RDW: 14.3 % (ref 11.5–15.5)
WBC: 8.7 10*3/uL (ref 4.0–10.5)
nRBC: 0 % (ref 0.0–0.2)

## 2020-02-07 LAB — COMPREHENSIVE METABOLIC PANEL
ALT: 21 U/L (ref 0–44)
AST: 21 U/L (ref 15–41)
Albumin: 3.2 g/dL — ABNORMAL LOW (ref 3.5–5.0)
Alkaline Phosphatase: 62 U/L (ref 38–126)
Anion gap: 9 (ref 5–15)
BUN: 15 mg/dL (ref 8–23)
CO2: 26 mmol/L (ref 22–32)
Calcium: 9.5 mg/dL (ref 8.9–10.3)
Chloride: 102 mmol/L (ref 98–111)
Creatinine, Ser: 1.13 mg/dL (ref 0.61–1.24)
GFR, Estimated: >60 mL/min (ref >60)
Glucose, Bld: 112 mg/dL — ABNORMAL HIGH (ref 70–99)
Potassium: 4 mmol/L (ref 3.5–5.1)
Sodium: 137 mmol/L (ref 135–145)
Total Bilirubin: 0.7 mg/dL (ref 0.3–1.2)
Total Protein: 7 g/dL (ref 6.5–8.1)

## 2020-02-07 LAB — PROTIME-INR
INR: 1 (ref 0.8–1.2)
Prothrombin Time: 13.2 seconds (ref 11.4–15.2)

## 2020-02-07 LAB — HIV ANTIBODY (ROUTINE TESTING W REFLEX): HIV Screen 4th Generation wRfx: NONREACTIVE

## 2020-02-07 LAB — SARS CORONAVIRUS 2 (TAT 6-24 HRS): SARS Coronavirus 2: NEGATIVE

## 2020-02-07 MED ORDER — IOHEXOL 300 MG/ML  SOLN
100.0000 mL | Freq: Once | INTRAMUSCULAR | Status: AC | PRN
  Administered 2020-02-07: 100 mL via INTRAVENOUS

## 2020-02-07 MED ORDER — GADOBUTROL 1 MMOL/ML IV SOLN
9.0000 mL | Freq: Once | INTRAVENOUS | Status: AC | PRN
  Administered 2020-02-07: 9 mL via INTRAVENOUS

## 2020-02-07 NOTE — Progress Notes (Signed)
Patient seen, full note to follow. Agree with bronchoscopy to establish diagnosis Imaging without any clear evidence of metastatic disease Will benefit from inpatient biopsy to expedite treatment.

## 2020-02-07 NOTE — Plan of Care (Signed)

## 2020-02-07 NOTE — H&P (View-Only) (Signed)
NAME:  DAWAUN BRANCATO, MRN:  [id number], DOB:  September 21, 1955, LOS: 0 ADMISSION DATE:  02/06/2020, CONSULTATION DATE:  02/07/20 REFERRING MD:  Marzetta Board, MD CHIEF COMPLAINT:  Hemoptysis  History of Present Illness:  Hansel Devan is a 65 year old male, former smoker who presented with 2 months of intermittent hemoptysis.  CT chest has revealed a right upper lobe mass and a large right suprahilar and mediastinal nodal mass. Further imaging of the abdomen/pelvis and brain do not indicate further metastatic involvement.  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 He reports the room where he worked would be filled with smoke that it would be difficult to see. He worked in this environment for about 10 years.  No family history of lung cancer.  He denies any shortness of breath, chest pain or pleuritic pain. He denies fevers, chills or sweats. He denies weight loss. He is otherwise active and is independent.   Past Medical History:  Hypertension  Significant Hospital Events:    Consults:  Oncology PCCM  Procedures:    Significant Diagnostic Tests:  02/06/20 CT Chest w/ contrast Right upper lobe mass lesion with associated large nodal mass involving the right suprahilar region and mediastinum in the paratracheal region consistent with primary pulmonary neoplasm and metastatic nodal involvement. Further workup to include tissue sampling is recommended.  1/30 CT Abd/Pelvis 8 mm nonobstructing calculus inferior LEFT kidney. Small umbilical hernia containing fat Bubbly mixed lytic and sclerotic lesion within the RIGHT femoral head of uncertain acuity, could represent a small enchondroma, metastasis considered less likely but not entirely excluded. No other intra-abdominal or intrapelvic abnormalities.  1/30 MRI Brain 1. Single punctate  focus of susceptibility artifact and faint nodular enhancement within the central pons as above. Finding favored to reflect a small vascular structure. Short interval follow-up MRI in 3 months suggested to document stability. 2. No other MRI evidence for intracranial metastatic disease. 3. 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  Micro Data:    Antimicrobials:    Interim History / Subjective:   He has been admitted to [facility]  Objective   Blood pressure 118/83, pulse 78, temperature 99 F (37.2 C), temperature source Oral, resp. rate (!) 22, SpO2 98 %.       No intake or output data in the 24 hours ending 02/07/20 0934 There were no vitals filed for this visit.  Examination: General: no acute distress, healthy appearing male HENT: AC/NT, sclera anicteric, moist mucous membranes Lungs: clear to auscultation. No wheezing or rhonchi. Cardiovascular: RRR. No murmurs. s1s2. Abdomen: soft, non-tender, non-distended, BS+ Extremities: warm. No edema Neuro: intact, no focal deficits GU: no foley in place  Resolved Hospital Problem list     Assessment & Plan:   Hemoptysis Right Upper Lobe Lung Mass Right Suprahilar and mediastinal Mass  Shahab Polhamus is a 65 year old male, former smoker who presented with 2 months of intermittent hemoptysis.  The CT chest is concerning for primary malignancy of the lung. He has been scheduled for EBUS on 02/08/20 at 11:30am to obtain biopsy samples. I have reviewed the risks of the procedure which include bleeding, infection and pneumothorax which he understands. If there are no complications with the procedure he will be safe for discharge afterwards and  I informed him he would need someone to pick him up from the hospital. We will follow up the results and discuss with him over the phone. Oncology has also been consulted and will be following along.    Labs   CBC: Recent Labs  Lab 02/06/20 1600 02/07/20 0445  WBC 8.5 8.7  HGB 13.8 13.1  HCT 42.3 41.8  MCV 82.1 84.1  PLT 354 546    Basic Metabolic Panel: Recent Labs  Lab 02/06/20 1600 02/07/20 0445  NA 137 137  K 4.0 4.0  CL 101 102  CO2 25 26  GLUCOSE 101* 112*  BUN 12 15  CREATININE 1.01 1.13  CALCIUM 9.8 9.5   GFR: Estimated Creatinine Clearance: 67.8 mL/min (by C-G formula based on SCr of 1.13 mg/dL). Recent Labs  Lab 02/06/20 1600 02/07/20 0445  WBC 8.5 8.7    Liver Function Tests: Recent Labs  Lab 02/07/20 0445  AST 21  ALT 21  ALKPHOS 62  BILITOT 0.7  PROT 7.0  ALBUMIN 3.2*   No results for input(s): LIPASE, AMYLASE in the last 168 hours. No results for input(s): AMMONIA in the last 168 hours.  ABG No results found for: PHART, PCO2ART, PO2ART, HCO3, TCO2, ACIDBASEDEF, O2SAT   Coagulation Profile: No results for input(s): INR, PROTIME in the last 168 hours.  Cardiac Enzymes: No results for input(s): CKTOTAL, CKMB, CKMBINDEX, TROPONINI in the last 168 hours.  HbA1C: No results found for: HGBA1C  CBG: No results for input(s): GLUCAP in the last 168 hours.  Review of Systems:   A 10 point review of systems was performed and is otherwise negative unless stated above.  Past Medical History:  He,  has a past medical history of Hypertension.   Surgical History:  History reviewed. No pertinent surgical history.   Social History:   reports that he quit smoking about 20 months ago. He has a 20.00 pack-year smoking history. He has never used smokeless tobacco. He reports that he does not use drugs.   Family History:  His family history includes Heart disease in his mother.   Allergies No Known Allergies   Home Medications  Prior to Admission medications   Medication Sig Start Date End Date Taking? Authorizing Provider  amLODipine (NORVASC) 10 MG tablet Take 10 mg by mouth daily. 12/14/19  Yes [provider]   lisinopril-hydrochlorothiazide (ZESTORETIC) 20-12.5 MG tablet Take 1 tablet by mouth daily. 12/14/19  Yes [provider]     Freda Jackson, MD [facility] Office: [phone]   See Amion for Pager Details

## 2020-02-07 NOTE — Plan of Care (Signed)
  Problem: Education: Goal: Knowledge of General Education information will improve Description Including pain rating scale, medication(s)/side effects and non-pharmacologic comfort measures Outcome: Progressing   

## 2020-02-07 NOTE — ED Notes (Signed)
Pt provided with chicken salad snack and water. Urinal and call bell at bedside.

## 2020-02-07 NOTE — Progress Notes (Signed)
PROGRESS NOTE  Adam [name] JGG:[id number] DOB: [birth year] DOA: 02/06/2020 PCP: Patient, No Pcp Per   LOS: 0 days   Brief Narrative / Interim history: 65 year old male with HTN, tobacco use with a year ago came into the hospital with multiple episodes of hemoptysis.  He has had this for the past couple of months, he initially felt it was due to the fact that he had this teeth pulled out several weeks ago, however surgery has healed and he kept having bloody sputum and eventually decided to come to the hospital.  Work-up in the ED revealed a new left lung mass  Subjective / 24h Interval events: He is doing well this morning, no hemoptysis overnight  Assessment & Plan: Principal Problem Lung mass, concern for malignancy -CT scan on admission showed a right upper lobe lesion with large nodal mass involving the suprahilar and mediastinal region concerning for primary pulmonary neoplasm.  EDP consulted oncology and patient was admitted to the hospital.  He does not have a PCP.  MRI of the brain without metastatic disease.  CT scan abdomen pelvis pending.  I consulted pulmonology to evaluate for bronchoscopy/biopsy, d/w Dr Erin Fulling, bronch with biopsy tomorrow. 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 amLODipine  10 mg Oral Daily  . lisinopril  20 mg Oral Daily   Continuous Infusions: PRN Meds:.acetaminophen **OR** acetaminophen, polyethylene glycol  Diet Orders (From admission, onward)    Start     Ordered   02/07/20 0300  Diet NPO time specified  Diet effective ____        02/06/20 2211          DVT prophylaxis: SCDs Start: 02/06/20 2209     Code Status: Full Code  Family Communication: no family at bedside  Status is: Observation  The patient will require care spanning > 2 midnights and should be moved to inpatient because: Ongoing diagnostic  testing needed not appropriate for outpatient work up  Dispo: The patient is from: Home              Anticipated d/c is to: Home              Anticipated d/c date is: 1 day              Patient currently is not medically stable to d/c.   Difficult to place patient No       Level of care: Med-Surg  Consultants:  Pulmonary   Procedures:  none  Microbiology  none  Antimicrobials: none    Objective: Vitals:   02/07/20 0230 02/07/20 0300 02/07/20 0530 02/07/20 0853  BP: (!) 90/56 99/68 93/61  118/83  Pulse: 85 84 78 78  Resp: (!) 23 (!) 22 20 (!) 22  Temp:    99 F (37.2 C)  TempSrc:    Oral  SpO2: 95% 95% 97% 98%   No intake or output data in the 24 hours ending 02/07/20 0921 There were no vitals filed for this visit.  Examination:  Constitutional: NAD Eyes: no scleral icterus ENMT: Mucous membranes are moist.  Neck: normal, supple Respiratory: clear to auscultation bilaterally, no wheezing, no crackles. Normal respiratory effort. No accessory muscle use.  Cardiovascular: Regular rate and rhythm, no murmurs / rubs / gallops. No LE edema. Good peripheral pulses Abdomen: non distended, no tenderness. Bowel sounds positive.  Musculoskeletal: no clubbing / cyanosis.  Skin:  no rashes Neurologic: CN 2-12 grossly intact. Strength 5/5 in all 4.   Data Reviewed: I have independently reviewed following labs and imaging studies   CBC: Recent Labs  Lab 02/06/20 1600 02/07/20 0445  WBC 8.5 8.7  HGB 13.8 13.1  HCT 42.3 41.8  MCV 82.1 84.1  PLT 354 130   Basic Metabolic Panel: Recent Labs  Lab 02/06/20 1600 02/07/20 0445  NA 137 137  K 4.0 4.0  CL 101 102  CO2 25 26  GLUCOSE 101* 112*  BUN 12 15  CREATININE 1.01 1.13  CALCIUM 9.8 9.5   Liver Function Tests: Recent Labs  Lab 02/07/20 0445  AST 21  ALT 21  ALKPHOS 62  BILITOT 0.7  PROT 7.0  ALBUMIN 3.2*   Coagulation Profile: No results for input(s): INR, PROTIME in the last 168  hours. HbA1C: No results for input(s): HGBA1C in the last 72 hours. CBG: No results for input(s): GLUCAP in the last 168 hours.  Recent Results (from the past 240 hour(s))  SARS CORONAVIRUS 2 (TAT 6-24 HRS) Nasopharyngeal Nasopharyngeal Swab     Status: None   Collection Time: 02/06/20  9:24 PM   Specimen: Nasopharyngeal Swab  Result Value Ref Range Status   SARS Coronavirus 2 NEGATIVE NEGATIVE Final    Comment: (NOTE) SARS-CoV-2 target nucleic acids are NOT DETECTED.  The SARS-CoV-2 RNA is generally detectable in upper and lower respiratory specimens during the acute phase of infection. Negative results do not preclude SARS-CoV-2 infection, do not rule out co-infections with other pathogens, and should not be used as the sole basis for treatment or other patient management decisions. Negative results must be combined with clinical observations, patient history, and epidemiological information. The expected result is Negative.  Fact Sheet for Patients: SugarRoll.be  Fact Sheet for Healthcare Providers: https://www.woods-mathews.com/  This test is not yet approved or cleared by the [place] FDA and  has been authorized for detection and/or diagnosis of SARS-CoV-2 by FDA under an Emergency Use Authorization (EUA). This EUA will remain  in effect (meaning this test can be used) for the duration of the COVID-19 declaration under Se ction 564(b)(1) of the Act, 21 U.S.C. section 360bbb-3(b)(1), unless the authorization is terminated or revoked sooner.  Performed at [facility], [place] [address], [place], [place] [address]      Radiology Studies: DG Chest 2 View  Result Date: 02/06/2020 CLINICAL DATA:  Shortness of breath with exertion. Hemoptysis last night. Ex-smoker. EXAM: CHEST - 2 VIEW COMPARISON:  Earlier today. FINDINGS: Again demonstrated is a large suprahilar/mediastinal mass with enlargement of the adjacent superior  right hilum. A small amount of patchy and linear density in the right upper lobe is unchanged. Clear left lung. Normal sized heart. Tortuous and partially calcified thoracic aorta. Thoracic spine degenerative changes. IMPRESSION: 1. Stable large right suprahilar/mediastinal mass with enlargement of the adjacent superior right hilum. This remains concerning for malignancy. A follow-up chest CT with contrast is recommended. 2. Stable mild right upper lobe scarring/bullous changes and possible pneumonia or postobstructive changes. Electronically Signed   By: Claudie Revering M.D.   On: 02/06/2020 16:16   DG Chest 2 View  Result Date: 02/06/2020 CLINICAL DATA:  65 year old male with a history of cough and bloody sputum EXAM: CHEST - 2 VIEW COMPARISON:  None available FINDINGS: Cardiac diameter within normal limits. Lobular density in the right suprahilar region, with no comparison. This corresponds to opacity in the hilar and suprahilar region on the lateral view.  Estimated diameter 8.4 cm. Patchy opacity peripheral to the hilar density in the right upper lobe with thickening of the minor fissure. No pleural effusion. No pneumothorax. IMPRESSION: Right suprahilar/mediastinal mass, concerning for malignancy, or less likely vascular pathology. Contrast-enhanced chest CT is recommended Electronically Signed   By: Corrie Mckusick D.O.   On: 02/06/2020 14:57   CT Chest W Contrast  Result Date: 02/06/2020 CLINICAL DATA:  Hemoptysis with right-sided suprahilar/mediastinal mass EXAM: CT CHEST WITH CONTRAST TECHNIQUE: Multidetector CT imaging of the chest was performed during intravenous contrast administration. CONTRAST:  37mL OMNIPAQUE IOHEXOL 300 MG/ML  SOLN COMPARISON:  Chest x-ray from earlier in the same day. FINDINGS: Cardiovascular: Thoracic aorta shows a normal branching pattern. Scattered atherosclerotic calcifications are noted. Coronary calcifications are seen. The heart is not significantly enlarged. Pulmonary  artery as visualized is within normal limits. Mediastinum/Nodes: Thoracic inlet is unremarkable. There is a large centrally necrotic mass lesion arising in the right suprahilar region and extending into the mediastinum adjacent to the trachea consistent with lymphadenopathy. This measures approximately 8.9 x 7.4 cm in greatest transverse and AP dimensions. This extends for at least 7.8 cm in craniocaudad projection. Extension into the right hilum is seen. Additionally in the right upper lobe there is a associated mass lesion which measures 2.9 x 2.6 cm. No sizable left hilar adenopathy is noted. The esophagus as visualized is within normal limits. Lungs/Pleura: Lungs are well aerated bilaterally and demonstrate some mild emphysematous changes. The left lung is clear. The right lung as previously described demonstrates a spiculated 2.9 x 2.6 cm mass lesion in the upper lobe consistent with a primary pulmonary neoplasm till proven otherwise. This radiates to the right suprahilar adenopathy mass. No sizable effusion is seen. No other parenchymal nodule is noted. Upper Abdomen: Visualized upper abdomen demonstrates a 9 mm non-obstructing left renal stone. Adrenal glands are within normal limits. Vague hypodensity is noted within the liver consistent with a small cyst. No definitive metastatic lesions are seen. Musculoskeletal: Degenerative changes of the thoracic spine are seen. IMPRESSION: Right upper lobe mass lesion with associated large nodal mass involving the right suprahilar region and mediastinum in the paratracheal region consistent with primary pulmonary neoplasm and metastatic nodal involvement. Further workup to include tissue sampling is recommended. Nonobstructing left renal stone. Aortic Atherosclerosis (ICD10-I70.0) and Emphysema (ICD10-J43.9). Electronically Signed   By: Inez Catalina M.D.   On: 02/06/2020 20:58   MR Brain W and Wo Contrast  Result Date: 02/07/2020 CLINICAL DATA:  Initial evaluation  for metastatic disease. EXAM: MRI HEAD WITHOUT AND WITH CONTRAST TECHNIQUE: Multiplanar, multiecho pulse sequences of the brain and surrounding structures were obtained without and with intravenous contrast. CONTRAST:  79mL GADAVIST GADOBUTROL 1 MMOL/ML IV SOLN COMPARISON:  None. FINDINGS: Brain: Mild age-related cerebral atrophy. Patchy and confluent T2/FLAIR hyperintensity within the periventricular and deep white matter both cerebral hemispheres, most consistent with chronic small vessel ischemic disease, mild in nature. Superimposed remote lacunar infarct present at the right basal ganglia. Associated chronic hemosiderin staining. No abnormal foci of restricted diffusion to suggest acute or subacute ischemia. Gray-white matter differentiation maintained. No encephalomalacia to suggest chronic cortical infarction elsewhere within the brain. No acute intracranial hemorrhage. Single punctate focus of susceptibility artifact noted within the central pons (series 14, image 20). Additional faint nodular enhancement seen at this level (series 18, image 20). Finding favored to reflect a small vascular structure. No other foci of susceptibility artifact to suggest chronic intracranial hemorrhage. No mass lesion, midline shift, or  mass effect. No hydrocephalus or extra-axial fluid collection. Pituitary gland suprasellar region within normal limits. Midline structures intact and normal. No other abnormal enhancement or evidence for intracranial metastatic disease. Vascular: Major intracranial vascular flow voids are maintained. Skull and upper cervical spine: Craniocervical junction within normal limits. Bone marrow signal intensity normal. No focal marrow replacing lesion. Scalp soft tissues within normal limits. Sinuses/Orbits: Globes and orbital soft tissues within normal limits. Paranasal sinuses are largely clear. No mastoid effusion. Inner ear structures grossly normal. Other: None. IMPRESSION: 1. Single punctate  focus of susceptibility artifact and faint nodular enhancement within the central pons as above. Finding favored to reflect a small vascular structure. Short interval follow-up MRI in 3 months suggested to document stability. 2. No other MRI evidence for intracranial metastatic disease. 3. 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 Electronically Signed   By: Jeannine Boga M.D.   On: 02/07/2020 02:55    Marzetta Board, MD, PhD [facility]  Between 7 am - 7 pm I am available, please contact me via Amion or Securechat  Between 7 pm - 7 am I am not available, please contact night coverage MD/APP via Amion

## 2020-02-07 NOTE — Consult Note (Signed)
[facility] CONSULT NOTE  Patient Care Team: Patient, No Pcp Per as PCP - General (General Practice)  CHIEF COMPLAINTS/PURPOSE OF CONSULTATION:  Hemoptysis.  ASSESSMENT & PLAN:  No problem-specific Assessment & Plan notes found for this encounter.  This is a very pleasant 65 yr old male patient who presented with hemoptysis for the past 3 months. Imaging showed RUL lesion with associated large nodal mass involving right suprahilar region and mediastinum in the paratracheal region consistent with primary pulmonary neoplasm and metastatic nodal involvement. No clear evidence of metastatic disease. Once pathology is confirmed,we will discuss treatment recommendations If non small cell histology, then he can be a candidate for concurrent chemoradiation . Will benefit from inpatient rad onc referral as well if he is still admitted tomorrow. We will plan FU outpatient after biopsy unless small cell histology when immediate treatment may be needed. Thank you for consulting [place] in the care of the patient. Please do not hesitate to contact us with any additional questions or concerns.  HISTORY OF PRESENTING ILLNESS:  Adam [name] 65 y.o. male is here because of hemoptysis  This is a 64 yr old male patient with PMH significant for HTN presented with chief complaint of hemoptysis. He had a dental extraction 3 months ago and soon after noticed scant hemoptysis which he attributed to the dental extraction. He then had two more episodes of scant hemoptysis, he describes it as pea size in his sputum. He may have noticed worsening SOB in the past 3 months but not significant. He lost about 10 lbs of weight in the past several months. He denies any chest pain or worsening cough. No other complaints. No change in bowel or urinary habits. He smoked 20 PPY, quit about 2 yrs ago. He worked as a Probation officer, Theme park manager. Rest of the pertinent 10 point ROS reviewed and  negative.  REVIEW OF SYSTEMS:   Constitutional: Denies fevers, chills or abnormal night sweats Eyes: Denies blurriness of vision, double vision or watery eyes Ears, nose, mouth, throat, and face: Denies mucositis or sore throat Respiratory: Denies cough, dyspnea or wheezes Cardiovascular: Denies palpitation, chest discomfort or lower extremity swelling Gastrointestinal:  Denies nausea, heartburn or change in bowel habits Skin: Denies abnormal skin rashes Lymphatics: Denies new lymphadenopathy or easy bruising Neurological:Denies numbness, tingling or new weaknesses Behavioral/Psych: Mood is stable, no new changes  All other systems were reviewed with the patient and are negative.  MEDICAL HISTORY:  Past Medical History:  Diagnosis Date  . Hypertension     SURGICAL HISTORY: History reviewed. No pertinent surgical history.  SOCIAL HISTORY: Social History   Socioeconomic History  . Marital status: Single    Spouse name: Not on file  . Number of children: Not on file  . Years of education: Not on file  . Highest education level: Not on file  Occupational History  . Not on file  Tobacco Use  . Smoking status: Former Smoker    Packs/day: 1.00    Years: 20.00    Pack years: 20.00    Quit date: 06/09/2018    Years since quitting: 1.6  . Smokeless tobacco: Never Used  Substance and Sexual Activity  . Alcohol use: Not on file  . Drug use: Never  . Sexual activity: Not on file  Other Topics Concern  . Not on file  Social History Narrative  . Not on file   Social Determinants of Health   Financial Resource Strain: Not on file  Food Insecurity: Not on file  Transportation Needs: Not on file  Physical Activity: Not on file  Stress: Not on file  Social Connections: Not on file  Intimate Partner Violence: Not on file    FAMILY HISTORY: Family History  Problem Relation Age of Onset  . Heart disease Mother     ALLERGIES:  has No Known Allergies.  MEDICATIONS:   Current Facility-Administered Medications  Medication Dose Route Frequency Provider Last Rate Last Admin  . acetaminophen (TYLENOL) tablet 650 mg  650 mg Oral Q6H PRN Marcelyn Bruins, MD       Or  . acetaminophen (TYLENOL) suppository 650 mg  650 mg Rectal Q6H PRN Marcelyn Bruins, MD      . amLODipine (NORVASC) tablet 10 mg  10 mg Oral Daily Marcelyn Bruins, MD   10 mg at 02/07/20 1309  . lisinopril (ZESTRIL) tablet 20 mg  20 mg Oral Daily Marcelyn Bruins, MD   20 mg at 02/07/20 1309  . polyethylene glycol (MIRALAX / GLYCOLAX) packet 17 g  17 g Oral Daily PRN Marcelyn Bruins, MD         PHYSICAL EXAMINATION: ECOG PERFORMANCE STATUS: 1 - Symptomatic but completely ambulatory  Vitals:   02/07/20 1236 02/07/20 1302  BP: 126/79 128/78  Pulse: 87 85  Resp: 16 17  Temp: 99 F (37.2 C) 98.8 F (37.1 C)  SpO2: 99% 100%   Filed Weights   02/07/20 1302  Weight: 186 lb (84.4 kg)    GENERAL:alert, no distress and comfortable SKIN: skin color, texture, turgor are normal, no rashes or significant lesions EYES: normal, conjunctiva are pink and non-injected, sclera clear OROPHARYNX:no exudate, no erythema and lips, buccal mucosa, and tongue normal  NECK: supple, thyroid normal size, non-tender, without nodularity LYMPH:  no palpable lymphadenopathy in the cervical, axillary or inguinal LUNGS: clear to auscultation and percussion with normal breathing effort HEART: regular rate & rhythm and no murmurs and no lower extremity edema ABDOMEN:abdomen soft, non-tender and normal bowel sounds Musculoskeletal:no cyanosis of digits and no clubbing  PSYCH: alert & oriented x 3 with fluent speech NEURO: no focal motor/sensory deficits  LABORATORY DATA:  I have reviewed the data as listed Lab Results  Component Value Date   WBC 8.7 02/07/2020   HGB 13.1 02/07/2020   HCT 41.8 02/07/2020   MCV 84.1 02/07/2020   PLT 346 02/07/2020   @LASTCHEM @  RADIOGRAPHIC STUDIES: I  have personally reviewed the radiological images as listed and agreed with the findings in the report. DG Chest 2 View  Result Date: 02/06/2020 CLINICAL DATA:  Shortness of breath with exertion. Hemoptysis last night. Ex-smoker. EXAM: CHEST - 2 VIEW COMPARISON:  Earlier today. FINDINGS: Again demonstrated is a large suprahilar/mediastinal mass with enlargement of the adjacent superior right hilum. A small amount of patchy and linear density in the right upper lobe is unchanged. Clear left lung. Normal sized heart. Tortuous and partially calcified thoracic aorta. Thoracic spine degenerative changes. IMPRESSION: 1. Stable large right suprahilar/mediastinal mass with enlargement of the adjacent superior right hilum. This remains concerning for malignancy. A follow-up chest CT with contrast is recommended. 2. Stable mild right upper lobe scarring/bullous changes and possible pneumonia or postobstructive changes. Electronically Signed   By: Claudie Revering M.D.   On: 02/06/2020 16:16   DG Chest 2 View  Result Date: 02/06/2020 CLINICAL DATA:  65 year old male with a history of cough and bloody sputum EXAM: CHEST - 2 VIEW COMPARISON:  None available  FINDINGS: Cardiac diameter within normal limits. Lobular density in the right suprahilar region, with no comparison. This corresponds to opacity in the hilar and suprahilar region on the lateral view. Estimated diameter 8.4 cm. Patchy opacity peripheral to the hilar density in the right upper lobe with thickening of the minor fissure. No pleural effusion. No pneumothorax. IMPRESSION: Right suprahilar/mediastinal mass, concerning for malignancy, or less likely vascular pathology. Contrast-enhanced chest CT is recommended Electronically Signed   By: Corrie Mckusick D.O.   On: 02/06/2020 14:57   CT Chest W Contrast  Result Date: 02/06/2020 CLINICAL DATA:  Hemoptysis with right-sided suprahilar/mediastinal mass EXAM: CT CHEST WITH CONTRAST TECHNIQUE: Multidetector CT imaging  of the chest was performed during intravenous contrast administration. CONTRAST:  50mL OMNIPAQUE IOHEXOL 300 MG/ML  SOLN COMPARISON:  Chest x-ray from earlier in the same day. FINDINGS: Cardiovascular: Thoracic aorta shows a normal branching pattern. Scattered atherosclerotic calcifications are noted. Coronary calcifications are seen. The heart is not significantly enlarged. Pulmonary artery as visualized is within normal limits. Mediastinum/Nodes: Thoracic inlet is unremarkable. There is a large centrally necrotic mass lesion arising in the right suprahilar region and extending into the mediastinum adjacent to the trachea consistent with lymphadenopathy. This measures approximately 8.9 x 7.4 cm in greatest transverse and AP dimensions. This extends for at least 7.8 cm in craniocaudad projection. Extension into the right hilum is seen. Additionally in the right upper lobe there is a associated mass lesion which measures 2.9 x 2.6 cm. No sizable left hilar adenopathy is noted. The esophagus as visualized is within normal limits. Lungs/Pleura: Lungs are well aerated bilaterally and demonstrate some mild emphysematous changes. The left lung is clear. The right lung as previously described demonstrates a spiculated 2.9 x 2.6 cm mass lesion in the upper lobe consistent with a primary pulmonary neoplasm till proven otherwise. This radiates to the right suprahilar adenopathy mass. No sizable effusion is seen. No other parenchymal nodule is noted. Upper Abdomen: Visualized upper abdomen demonstrates a 9 mm non-obstructing left renal stone. Adrenal glands are within normal limits. Vague hypodensity is noted within the liver consistent with a small cyst. No definitive metastatic lesions are seen. Musculoskeletal: Degenerative changes of the thoracic spine are seen. IMPRESSION: Right upper lobe mass lesion with associated large nodal mass involving the right suprahilar region and mediastinum in the paratracheal region  consistent with primary pulmonary neoplasm and metastatic nodal involvement. Further workup to include tissue sampling is recommended. Nonobstructing left renal stone. Aortic Atherosclerosis (ICD10-I70.0) and Emphysema (ICD10-J43.9). Electronically Signed   By: Inez Catalina M.D.   On: 02/06/2020 20:58   MR Brain W and Wo Contrast  Result Date: 02/07/2020 CLINICAL DATA:  Initial evaluation for metastatic disease. EXAM: MRI HEAD WITHOUT AND WITH CONTRAST TECHNIQUE: Multiplanar, multiecho pulse sequences of the brain and surrounding structures were obtained without and with intravenous contrast. CONTRAST:  10mL GADAVIST GADOBUTROL 1 MMOL/ML IV SOLN COMPARISON:  None. FINDINGS: Brain: Mild age-related cerebral atrophy. Patchy and confluent T2/FLAIR hyperintensity within the periventricular and deep white matter both cerebral hemispheres, most consistent with chronic small vessel ischemic disease, mild in nature. Superimposed remote lacunar infarct present at the right basal ganglia. Associated chronic hemosiderin staining. No abnormal foci of restricted diffusion to suggest acute or subacute ischemia. Gray-white matter differentiation maintained. No encephalomalacia to suggest chronic cortical infarction elsewhere within the brain. No acute intracranial hemorrhage. Single punctate focus of susceptibility artifact noted within the central pons (series 14, image 20). Additional faint nodular enhancement seen at this  level (series 18, image 20). Finding favored to reflect a small vascular structure. No other foci of susceptibility artifact to suggest chronic intracranial hemorrhage. No mass lesion, midline shift, or mass effect. No hydrocephalus or extra-axial fluid collection. Pituitary gland suprasellar region within normal limits. Midline structures intact and normal. No other abnormal enhancement or evidence for intracranial metastatic disease. Vascular: Major intracranial vascular flow voids are maintained. Skull  and upper cervical spine: Craniocervical junction within normal limits. Bone marrow signal intensity normal. No focal marrow replacing lesion. Scalp soft tissues within normal limits. Sinuses/Orbits: Globes and orbital soft tissues within normal limits. Paranasal sinuses are largely clear. No mastoid effusion. Inner ear structures grossly normal. Other: None. IMPRESSION: 1. Single punctate focus of susceptibility artifact and faint nodular enhancement within the central pons as above. Finding favored to reflect a small vascular structure. Short interval follow-up MRI in 3 months suggested to document stability. 2. No other MRI evidence for intracranial metastatic disease. 3. 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 Electronically Signed   By: Jeannine Boga M.D.   On: 02/07/2020 02:55   CT ABDOMEN PELVIS W CONTRAST  Result Date: 02/07/2020 CLINICAL DATA:  Cancer of unknown primary question pulmonary malignancy by prior CT chest EXAM: CT ABDOMEN AND PELVIS WITH CONTRAST TECHNIQUE: Multidetector CT imaging of the abdomen and pelvis was performed using the standard protocol following bolus administration of intravenous contrast. Sagittal and coronal MPR images reconstructed from axial data set. CONTRAST:  156mL OMNIPAQUE IOHEXOL 300 MG/ML SOLN IV. No oral contrast. COMPARISON:  CT chest 02/06/2020 FINDINGS: Lower chest: Lung bases clear Hepatobiliary: Tiny cyst LEFT lobe liver 9 x 6 mm image 13. Gallbladder and liver otherwise normal appearance Pancreas: Normal appearance Spleen: Normal appearance. Two tiny splenule is noted inferior to spleen. Adrenals/Urinary Tract: 8 mm nonobstructing calculus inferior LEFT kidney image 30. Adrenal glands, kidneys, and ureters otherwise normal appearance. Bladder wall appears prominent but bladder is decompressed likely artifactual and contains excreted contrast from prior  CT. Stomach/Bowel: Normal appendix. Stomach and bowel loops normal appearance Vascular/Lymphatic: Atherosclerotic calcifications aorta and iliac arteries. Aorta normal caliber. No adenopathy. Reproductive: Unremarkable Other: No free air or free fluid. Small umbilical hernia containing fat. Musculoskeletal: Bubbly mixed lytic and sclerotic lesion within the RIGHT femoral head of uncertain acuity, could represent a small enchondroma, metastasis considered less likely but not entirely excluded. Degenerative disc disease changes of thoracolumbar spine. No additional focal bone lesions. IMPRESSION: 8 mm nonobstructing calculus inferior LEFT kidney. Small umbilical hernia containing fat. Bubbly mixed lytic and sclerotic lesion within the RIGHT femoral head of uncertain acuity, could represent a small enchondroma, metastasis considered less likely but not entirely excluded. No other intra-abdominal or intrapelvic abnormalities. Aortic Atherosclerosis (ICD10-I70.0). Electronically Signed   By: Lavonia Dana M.D.   On: 02/07/2020 10:21    All questions were answered. The patient knows to call the clinic with any problems, questions or concerns. I spent 50 minutes in the care of this patient including H and P, review of records, counseling and coordination of care.     Benay Pike, MD 02/07/2020 2:37 PM

## 2020-02-07 NOTE — ED Notes (Signed)
Pt to MRI

## 2020-02-07 NOTE — Consult Note (Signed)
NAME:  STEVENSON WINDMILLER, MRN:  [id number], DOB:  1955/02/25, LOS: 0 ADMISSION DATE:  02/06/2020, CONSULTATION DATE:  02/07/20 REFERRING MD:  Marzetta Board, MD CHIEF COMPLAINT:  Hemoptysis  History of Present Illness:  Adam [name] is a 65 year old male, former smoker who presented with 2 months of intermittent hemoptysis.  CT chest has revealed a right upper lobe mass and a large right suprahilar and mediastinal nodal mass. Further imaging of the abdomen/pelvis and brain do not indicate further metastatic involvement.  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 He reports the room where he worked would be filled with smoke that it would be difficult to see. He worked in this environment for about 10 years.  No family history of lung cancer.  He denies any shortness of breath, chest pain or pleuritic pain. He denies fevers, chills or sweats. He denies weight loss. He is otherwise active and is independent.   Past Medical History:  Hypertension  Significant Hospital Events:    Consults:  Oncology PCCM  Procedures:    Significant Diagnostic Tests:  02/06/20 CT Chest w/ contrast Right upper lobe mass lesion with associated large nodal mass involving the right suprahilar region and mediastinum in the paratracheal region consistent with primary pulmonary neoplasm and metastatic nodal involvement. Further workup to include tissue sampling is recommended.  1/30 CT Abd/Pelvis 8 mm nonobstructing calculus inferior LEFT kidney. Small umbilical hernia containing fat Bubbly mixed lytic and sclerotic lesion within the RIGHT femoral head of uncertain acuity, could represent a small enchondroma, metastasis considered less likely but not entirely excluded. No other intra-abdominal or intrapelvic abnormalities.  1/30 MRI Brain 1. Single punctate  focus of susceptibility artifact and faint nodular enhancement within the central pons as above. Finding favored to reflect a small vascular structure. Short interval follow-up MRI in 3 months suggested to document stability. 2. No other MRI evidence for intracranial metastatic disease. 3. 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  Micro Data:    Antimicrobials:    Interim History / Subjective:   He has been admitted to [facility]  Objective   Blood pressure 118/83, pulse 78, temperature 99 F (37.2 C), temperature source Oral, resp. rate (!) 22, SpO2 98 %.       No intake or output data in the 24 hours ending 02/07/20 0934 There were no vitals filed for this visit.  Examination: General: no acute distress, healthy appearing male HENT: AC/NT, sclera anicteric, moist mucous membranes Lungs: clear to auscultation. No wheezing or rhonchi. Cardiovascular: RRR. No murmurs. s1s2. Abdomen: soft, non-tender, non-distended, BS+ Extremities: warm. No edema Neuro: intact, no focal deficits GU: no foley in place  Resolved Hospital Problem list     Assessment & Plan:   Hemoptysis Right Upper Lobe Lung Mass Right Suprahilar and mediastinal Mass  Adam [name] is a 65 year old male, former smoker who presented with 2 months of intermittent hemoptysis.  The CT chest is concerning for primary malignancy of the lung. He has been scheduled for EBUS on 02/08/20 at 11:30am to obtain biopsy samples. I have reviewed the risks of the procedure which include bleeding, infection and pneumothorax which he understands. If there are no complications with the procedure he will be safe for discharge afterwards and  I informed him he would need someone to pick him up from the hospital. We will follow up the results and discuss with him over the phone. Oncology has also been consulted and will be following along.    Labs   CBC: Recent Labs  Lab 02/06/20 1600 02/07/20 0445  WBC 8.5 8.7  HGB 13.8 13.1  HCT 42.3 41.8  MCV 82.1 84.1  PLT 354 371    Basic Metabolic Panel: Recent Labs  Lab 02/06/20 1600 02/07/20 0445  NA 137 137  K 4.0 4.0  CL 101 102  CO2 25 26  GLUCOSE 101* 112*  BUN 12 15  CREATININE 1.01 1.13  CALCIUM 9.8 9.5   GFR: Estimated Creatinine Clearance: 67.8 mL/min (by C-G formula based on SCr of 1.13 mg/dL). Recent Labs  Lab 02/06/20 1600 02/07/20 0445  WBC 8.5 8.7    Liver Function Tests: Recent Labs  Lab 02/07/20 0445  AST 21  ALT 21  ALKPHOS 62  BILITOT 0.7  PROT 7.0  ALBUMIN 3.2*   No results for input(s): LIPASE, AMYLASE in the last 168 hours. No results for input(s): AMMONIA in the last 168 hours.  ABG No results found for: PHART, PCO2ART, PO2ART, HCO3, TCO2, ACIDBASEDEF, O2SAT   Coagulation Profile: No results for input(s): INR, PROTIME in the last 168 hours.  Cardiac Enzymes: No results for input(s): CKTOTAL, CKMB, CKMBINDEX, TROPONINI in the last 168 hours.  HbA1C: No results found for: HGBA1C  CBG: No results for input(s): GLUCAP in the last 168 hours.  Review of Systems:   A 10 point review of systems was performed and is otherwise negative unless stated above.  Past Medical History:  He,  has a past medical history of Hypertension.   Surgical History:  History reviewed. No pertinent surgical history.   Social History:   reports that he quit smoking about 20 months ago. He has a 20.00 pack-year smoking history. He has never used smokeless tobacco. He reports that he does not use drugs.   Family History:  His family history includes Heart disease in his mother.   Allergies No Known Allergies   Home Medications  Prior to Admission medications   Medication Sig Start Date End Date Taking? Authorizing Provider  amLODipine (NORVASC) 10 MG tablet Take 10 mg by mouth daily. 12/14/19  Yes [provider]   lisinopril-hydrochlorothiazide (ZESTORETIC) 20-12.5 MG tablet Take 1 tablet by mouth daily. 12/14/19  Yes [provider]     Freda Jackson, MD [facility] Office: [phone]   See Amion for Pager Details

## 2020-02-08 ENCOUNTER — Inpatient Hospital Stay (HOSPITAL_COMMUNITY): Payer: 59 | Admitting: Anesthesiology

## 2020-02-08 ENCOUNTER — Encounter (HOSPITAL_COMMUNITY)
Admission: EM | Disposition: A | Discharge status: Home / Self Care | Payer: Self-pay | Source: Home / Self Care | Attending: Internal Medicine

## 2020-02-08 ENCOUNTER — Encounter (HOSPITAL_COMMUNITY): Payer: Self-pay | Admitting: Internal Medicine

## 2020-02-08 ENCOUNTER — Inpatient Hospital Stay (HOSPITAL_COMMUNITY): Payer: 59

## 2020-02-08 DIAGNOSIS — R042 Hemoptysis: Secondary | ICD-10-CM | POA: Diagnosis not present

## 2020-02-08 HISTORY — PX: VIDEO BRONCHOSCOPY WITH ENDOBRONCHIAL ULTRASOUND: SHX6177

## 2020-02-08 HISTORY — PX: BRONCHIAL BRUSHINGS: SHX5108

## 2020-02-08 HISTORY — PX: BRONCHIAL NEEDLE ASPIRATION BIOPSY: SHX5106

## 2020-02-08 SURGERY — BRONCHOSCOPY, WITH EBUS
Anesthesia: General

## 2020-02-08 MED ORDER — MIDAZOLAM HCL 2 MG/2ML IJ SOLN
INTRAMUSCULAR | Status: DC | PRN
  Administered 2020-02-08: 2 mg via INTRAVENOUS

## 2020-02-08 MED ORDER — ROCURONIUM BROMIDE 10 MG/ML (PF) SYRINGE
PREFILLED_SYRINGE | INTRAVENOUS | Status: DC | PRN
  Administered 2020-02-08: 50 mg via INTRAVENOUS

## 2020-02-08 MED ORDER — FENTANYL CITRATE (PF) 250 MCG/5ML IJ SOLN
INTRAMUSCULAR | Status: DC | PRN
  Administered 2020-02-08: 50 ug via INTRAVENOUS

## 2020-02-08 MED ORDER — PHENYLEPHRINE HCL-NACL 10-0.9 MG/250ML-% IV SOLN
INTRAVENOUS | Status: DC | PRN
  Administered 2020-02-08: 50 ug/min via INTRAVENOUS

## 2020-02-08 MED ORDER — PHENYLEPHRINE 40 MCG/ML (10ML) SYRINGE FOR IV PUSH (FOR BLOOD PRESSURE SUPPORT)
PREFILLED_SYRINGE | INTRAVENOUS | Status: DC | PRN
  Administered 2020-02-08 (×3): 120 ug via INTRAVENOUS

## 2020-02-08 MED ORDER — DEXAMETHASONE SODIUM PHOSPHATE 10 MG/ML IJ SOLN
INTRAMUSCULAR | Status: DC | PRN
  Administered 2020-02-08: 5 mg via INTRAVENOUS

## 2020-02-08 MED ORDER — LIDOCAINE 2% (20 MG/ML) 5 ML SYRINGE
INTRAMUSCULAR | Status: DC | PRN
  Administered 2020-02-08: 60 mg via INTRAVENOUS

## 2020-02-08 MED ORDER — PROPOFOL 10 MG/ML IV BOLUS
INTRAVENOUS | Status: DC | PRN
  Administered 2020-02-08: 180 mg via INTRAVENOUS

## 2020-02-08 MED ORDER — SUGAMMADEX SODIUM 200 MG/2ML IV SOLN
INTRAVENOUS | Status: DC | PRN
  Administered 2020-02-08: 50 mg via INTRAVENOUS
  Administered 2020-02-08 (×3): 100 mg via INTRAVENOUS

## 2020-02-08 MED ORDER — ONDANSETRON HCL 4 MG/2ML IJ SOLN
INTRAMUSCULAR | Status: DC | PRN
  Administered 2020-02-08: 4 mg via INTRAVENOUS

## 2020-02-08 MED ORDER — LACTATED RINGERS IV SOLN: INTRAVENOUS | Status: DC | PRN

## 2020-02-08 NOTE — Progress Notes (Incomplete)
[facility]         [phone] ________________________________  Initial inpatient Consultation  Name: Adam [name] MRN: [id number]  Date: 02/06/2020  DOB: 1955/12/21  REFERRING PHYSICIAN: No ref. 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 Hemoptysis  R04.2   2. Mass of right lung  R91.8     HISTORY OF PRESENT ILLNESS::Adam [name] is a 65 y.o. male who presented with hemoptysis for the past 3 months. Imaging showed RUL lesion with associated large nodal mass involving right suprahilar region and mediastinum in the paratracheal region consistent with primary pulmonary neoplasm and metastatic nodal involvement.    No clear evidence of metastatic disease on brain MRI, CT Chest/Abd/Pelvis.  He underwent bronchoscopy with biopsy earlier today.  PREVIOUS RADIATION THERAPY: No  Past Medical History:  Diagnosis Date  . Hypertension   :  History reviewed. No pertinent surgical history.:   Current Facility-Administered Medications:  .  [MAR Hold] acetaminophen (TYLENOL) tablet 650 mg, 650 mg, Oral, Q6H PRN **OR** [MAR Hold] acetaminophen (TYLENOL) suppository 650 mg, 650 mg, Rectal, Q6H PRN, Marcelyn Bruins, MD .  Doug Sou Hold] amLODipine (NORVASC) tablet 10 mg, 10 mg, Oral, Daily, Marcelyn Bruins, MD, 10 mg at 02/07/20 1309 .  [MAR Hold] lisinopril (ZESTRIL) tablet 20 mg, 20 mg, Oral, Daily, Marcelyn Bruins, MD, 20 mg at 02/07/20 1309 .  [MAR Hold] polyethylene glycol (MIRALAX / GLYCOLAX) packet 17 g, 17 g, Oral, Daily PRN, Marcelyn Bruins, MD  Facility-Administered Medications Ordered in Other Encounters:  .  dexamethasone (DECADRON) injection, , Intravenous, Anesthesia Intra-op, Rande Brunt, CRNA, 5 mg at 02/08/20 1310 .  fentaNYL citrate (PF) (SUBLIMAZE) injection, , Intravenous, Anesthesia Intra-op, Rande Brunt, CRNA, 50 mcg at 02/08/20  1301 .  lactated ringers infusion, , Intravenous, Continuous PRN, Rande Brunt, CRNA, New Bag at 02/08/20 1256 .  lidocaine 2% (20 mg/mL) 5 mL syringe, , Intravenous, Anesthesia Intra-op, Rande Brunt, CRNA, 60 mg at 02/08/20 1301 .  midazolam (VERSED) injection, , Intravenous, Anesthesia Intra-op, Rande Brunt, CRNA, 2 mg at 02/08/20 1256 .  ondansetron (ZOFRAN) injection, , Intravenous, Anesthesia Intra-op, Rande Brunt, CRNA, 4 mg at 02/08/20 1310 .  phenylephrine (NEOSYNEPHRINE) 10-0.9 MG/250ML-% infusion, , Intravenous, Continuous PRN, Rande Brunt, CRNA, Last Rate: 150 mL/hr at 02/08/20 1335, 100 mcg/min at 02/08/20 1335 .  PHENYLephrine 40 mcg/ml in normal saline Adult IV Push Syringe (For Blood Pressure Support), , Intravenous, Anesthesia Intra-op, Rande Brunt, CRNA, 120 mcg at 02/08/20 1312 .  propofol (DIPRIVAN) 10 mg/mL bolus/IV push, , Intravenous, Anesthesia Intra-op, Rande Brunt, CRNA, 180 mg at 02/08/20 1302 .  rocuronium bromide 10 mg/mL (PF) syringe, , Intravenous, Anesthesia Intra-op, Rande Brunt, CRNA, 50 mg at 02/08/20 1302:  No Known Allergies:  Family History  Problem Relation Age of Onset  . Heart disease Mother   :  Social History   Socioeconomic History  . Marital status: Single    Spouse name: Not on file  . Number of children: Not on file  . Years of education: Not on file  . Highest education level: Not on file  Occupational History  . Not on file  Tobacco Use  . Smoking status: Former Smoker    Packs/day: 1.00    Years: 20.00    Pack years: 20.00    Quit date: 06/09/2018  Years since quitting: 1.6  . Smokeless tobacco: Never Used  Substance and Sexual Activity  . Alcohol use: Not on file  . Drug use: Never  . Sexual activity: Not on file  Other Topics Concern  . Not on file  Social History Narrative  . Not on file   Social Determinants of Health   Financial Resource Strain: Not on file  Food Insecurity: Not on file   Transportation Needs: Not on file  Physical Activity: Not on file  Stress: Not on file  Social Connections: Not on file  Intimate Partner Violence: Not on file  :  REVIEW OF SYSTEMS:  A 15 point review of systems is documented in the electronic medical record. This was obtained by the nursing staff. However, I reviewed this with the patient to discuss relevant findings and make appropriate changes.  He had a dental extraction 3 months ago and soon after noticed scant hemoptysis which he attributed to the dental extraction.   PHYSICAL EXAM:  Blood pressure 139/84, pulse 79, temperature 97.9 F (36.6 C), temperature source Axillary, resp. rate 14, height 5\' 9"  (1.753 m), weight 185 lb 13.6 oz (84.3 kg), SpO2 100 %. {physical exam:3041130}  KPS = ***  100 - Normal; no complaints; no evidence of disease. 90   - Able to carry on normal activity; minor signs or symptoms of disease. 80   - Normal activity with effort; some signs or symptoms of disease. 65   - Cares for self; unable to carry on normal activity or to do active work. 60   - Requires occasional assistance, but is able to care for most of his personal needs. 50   - Requires considerable assistance and frequent medical care. 39   - Disabled; requires special care and assistance. 50   - Severely disabled; hospital admission is indicated although death not imminent. 69   - Very sick; hospital admission necessary; active supportive treatment necessary. 10   - Moribund; fatal processes progressing rapidly. 0     - Dead  Karnofsky DA, Abelmann Huslia, Craver LS and Burchenal [name] [phone]) The use of the nitrogen mustards in the palliative treatment of carcinoma: with particular reference to bronchogenic carcinoma Cancer 1 634-56  LABORATORY DATA:  Lab Results  Component Value Date   WBC 8.7 02/07/2020   HGB 13.1 02/07/2020   HCT 41.8 02/07/2020   MCV 84.1 02/07/2020   PLT 346 02/07/2020   Lab Results  Component Value Date   NA 137  02/07/2020   K 4.0 02/07/2020   CL 102 02/07/2020   CO2 26 02/07/2020   Lab Results  Component Value Date   ALT 21 02/07/2020   AST 21 02/07/2020   ALKPHOS 62 02/07/2020   BILITOT 0.7 02/07/2020     RADIOGRAPHY: DG Chest 2 View  Result Date: 02/06/2020 CLINICAL DATA:  Shortness of breath with exertion. Hemoptysis last night. Ex-smoker. EXAM: CHEST - 2 VIEW COMPARISON:  Earlier today. FINDINGS: Again demonstrated is a large suprahilar/mediastinal mass with enlargement of the adjacent superior right hilum. A small amount of patchy and linear density in the right upper lobe is unchanged. Clear left lung. Normal sized heart. Tortuous and partially calcified thoracic aorta. Thoracic spine degenerative changes. IMPRESSION: 1. Stable large right suprahilar/mediastinal mass with enlargement of the adjacent superior right hilum. This remains concerning for malignancy. A follow-up chest CT with contrast is recommended. 2. Stable mild right upper lobe scarring/bullous changes and possible pneumonia or postobstructive changes. Electronically Signed  By: Claudie Revering M.D.   On: 02/06/2020 16:16   DG Chest 2 View  Result Date: 02/06/2020 CLINICAL DATA:  65 year old male with a history of cough and bloody sputum EXAM: CHEST - 2 VIEW COMPARISON:  None available FINDINGS: Cardiac diameter within normal limits. Lobular density in the right suprahilar region, with no comparison. This corresponds to opacity in the hilar and suprahilar region on the lateral view. Estimated diameter 8.4 cm. Patchy opacity peripheral to the hilar density in the right upper lobe with thickening of the minor fissure. No pleural effusion. No pneumothorax. IMPRESSION: Right suprahilar/mediastinal mass, concerning for malignancy, or less likely vascular pathology. Contrast-enhanced chest CT is recommended Electronically Signed   By: Corrie Mckusick D.O.   On: 02/06/2020 14:57   CT Chest W Contrast  Result Date: 02/06/2020 CLINICAL DATA:   Hemoptysis with right-sided suprahilar/mediastinal mass EXAM: CT CHEST WITH CONTRAST TECHNIQUE: Multidetector CT imaging of the chest was performed during intravenous contrast administration. CONTRAST:  80mL OMNIPAQUE IOHEXOL 300 MG/ML  SOLN COMPARISON:  Chest x-ray from earlier in the same day. FINDINGS: Cardiovascular: Thoracic aorta shows a normal branching pattern. Scattered atherosclerotic calcifications are noted. Coronary calcifications are seen. The heart is not significantly enlarged. Pulmonary artery as visualized is within normal limits. Mediastinum/Nodes: Thoracic inlet is unremarkable. There is a large centrally necrotic mass lesion arising in the right suprahilar region and extending into the mediastinum adjacent to the trachea consistent with lymphadenopathy. This measures approximately 8.9 x 7.4 cm in greatest transverse and AP dimensions. This extends for at least 7.8 cm in craniocaudad projection. Extension into the right hilum is seen. Additionally in the right upper lobe there is a associated mass lesion which measures 2.9 x 2.6 cm. No sizable left hilar adenopathy is noted. The esophagus as visualized is within normal limits. Lungs/Pleura: Lungs are well aerated bilaterally and demonstrate some mild emphysematous changes. The left lung is clear. The right lung as previously described demonstrates a spiculated 2.9 x 2.6 cm mass lesion in the upper lobe consistent with a primary pulmonary neoplasm till proven otherwise. This radiates to the right suprahilar adenopathy mass. No sizable effusion is seen. No other parenchymal nodule is noted. Upper Abdomen: Visualized upper abdomen demonstrates a 9 mm non-obstructing left renal stone. Adrenal glands are within normal limits. Vague hypodensity is noted within the liver consistent with a small cyst. No definitive metastatic lesions are seen. Musculoskeletal: Degenerative changes of the thoracic spine are seen. IMPRESSION: Right upper lobe mass lesion  with associated large nodal mass involving the right suprahilar region and mediastinum in the paratracheal region consistent with primary pulmonary neoplasm and metastatic nodal involvement. Further workup to include tissue sampling is recommended. Nonobstructing left renal stone. Aortic Atherosclerosis (ICD10-I70.0) and Emphysema (ICD10-J43.9). Electronically Signed   By: Inez Catalina M.D.   On: 02/06/2020 20:58   MR Brain W and Wo Contrast  Result Date: 02/07/2020 CLINICAL DATA:  Initial evaluation for metastatic disease. EXAM: MRI HEAD WITHOUT AND WITH CONTRAST TECHNIQUE: Multiplanar, multiecho pulse sequences of the brain and surrounding structures were obtained without and with intravenous contrast. CONTRAST:  43mL GADAVIST GADOBUTROL 1 MMOL/ML IV SOLN COMPARISON:  None. FINDINGS: Brain: Mild age-related cerebral atrophy. Patchy and confluent T2/FLAIR hyperintensity within the periventricular and deep white matter both cerebral hemispheres, most consistent with chronic small vessel ischemic disease, mild in nature. Superimposed remote lacunar infarct present at the right basal ganglia. Associated chronic hemosiderin staining. No abnormal foci of restricted diffusion to suggest acute or subacute  ischemia. Gray-white matter differentiation maintained. No encephalomalacia to suggest chronic cortical infarction elsewhere within the brain. No acute intracranial hemorrhage. Single punctate focus of susceptibility artifact noted within the central pons (series 14, image 20). Additional faint nodular enhancement seen at this level (series 18, image 20). Finding favored to reflect a small vascular structure. No other foci of susceptibility artifact to suggest chronic intracranial hemorrhage. No mass lesion, midline shift, or mass effect. No hydrocephalus or extra-axial fluid collection. Pituitary gland suprasellar region within normal limits. Midline structures intact and normal. No other abnormal enhancement or  evidence for intracranial metastatic disease. Vascular: Major intracranial vascular flow voids are maintained. Skull and upper cervical spine: Craniocervical junction within normal limits. Bone marrow signal intensity normal. No focal marrow replacing lesion. Scalp soft tissues within normal limits. Sinuses/Orbits: Globes and orbital soft tissues within normal limits. Paranasal sinuses are largely clear. No mastoid effusion. Inner ear structures grossly normal. Other: None. IMPRESSION: 1. Single punctate focus of susceptibility artifact and faint nodular enhancement within the central pons as above. Finding favored to reflect a small vascular structure. Short interval follow-up MRI in 3 months suggested to document stability. 2. No other MRI evidence for intracranial metastatic disease. 3. 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 Electronically Signed   By: Jeannine Boga M.D.   On: 02/07/2020 02:55   CT ABDOMEN PELVIS W CONTRAST  Result Date: 02/07/2020 CLINICAL DATA:  Cancer of unknown primary question pulmonary malignancy by prior CT chest EXAM: CT ABDOMEN AND PELVIS WITH CONTRAST TECHNIQUE: Multidetector CT imaging of the abdomen and pelvis was performed using the standard protocol following bolus administration of intravenous contrast. Sagittal and coronal MPR images reconstructed from axial data set. CONTRAST:  188mL OMNIPAQUE IOHEXOL 300 MG/ML SOLN IV. No oral contrast. COMPARISON:  CT chest 02/06/2020 FINDINGS: Lower chest: Lung bases clear Hepatobiliary: Tiny cyst LEFT lobe liver 9 x 6 mm image 13. Gallbladder and liver otherwise normal appearance Pancreas: Normal appearance Spleen: Normal appearance. Two tiny splenule is noted inferior to spleen. Adrenals/Urinary Tract: 8 mm nonobstructing calculus inferior LEFT kidney image 30. Adrenal glands, kidneys, and ureters otherwise normal appearance.  Bladder wall appears prominent but bladder is decompressed likely artifactual and contains excreted contrast from prior CT. Stomach/Bowel: Normal appendix. Stomach and bowel loops normal appearance Vascular/Lymphatic: Atherosclerotic calcifications aorta and iliac arteries. Aorta normal caliber. No adenopathy. Reproductive: Unremarkable Other: No free air or free fluid. Small umbilical hernia containing fat. Musculoskeletal: Bubbly mixed lytic and sclerotic lesion within the RIGHT femoral head of uncertain acuity, could represent a small enchondroma, metastasis considered less likely but not entirely excluded. Degenerative disc disease changes of thoracolumbar spine. No additional focal bone lesions. IMPRESSION: 8 mm nonobstructing calculus inferior LEFT kidney. Small umbilical hernia containing fat. Bubbly mixed lytic and sclerotic lesion within the RIGHT femoral head of uncertain acuity, could represent a small enchondroma, metastasis considered less likely but not entirely excluded. No other intra-abdominal or intrapelvic abnormalities. Aortic Atherosclerosis (ICD10-I70.0). Electronically Signed   By: Lavonia Dana M.D.   On: 02/07/2020 10:21      IMPRESSION: ***  PLAN:***  I spent {CHL ONC TIME VISIT - JOINO:[id number]} minutes face to face with the patient and more than 50% of that time was spent in counseling and/or coordination of care.   ------------------------------------------------   Tyler Pita, MD [place] Director and Director of Stereotactic Radiosurgery Direct Dial: [phone]  Fax: [phone]  [email or web address]  Skype  [email or web address]

## 2020-02-08 NOTE — Interval H&P Note (Signed)
History and Physical Interval Note:  02/08/2020 11:45 AM  Adam [name]  has presented today for surgery, with the diagnosis of lung mass.  The various methods of treatment have been discussed with the patient and family. After consideration of risks, benefits and other options for treatment, the patient has consented to  Procedure(s): [place] (N/A) as a surgical intervention.  The patient's history has been reviewed, patient examined, no change in status, stable for surgery.  I have reviewed the patient's chart and labs.  Questions were answered to the patient's satisfaction.     Freddi Starr

## 2020-02-08 NOTE — Progress Notes (Signed)
Discussed with Dr Tammi Klippel from radiation oncology for inpatient consultation given concern for locally advanced disease Cytology pending. Again as discussed if NSCLC, he will be a good candidate for chemoradiation. I will see him outpatient following discharge, but establishing with rad onc inpatient will expedite outpatient treatment. Dr Tammi Klippel has kindly agreed to see the patient .

## 2020-02-08 NOTE — Op Note (Signed)
Flexible and EBUS Bronchoscopy Procedure Note  Adam [name]  [id number]  1955/01/10  Date:02/08/20  Time:2:06 PM   Provider Performing:Caileigh Canche B Kateria Cutrona   Procedure: Flexible bronchoscopy and EBUS Bronchoscopy  Indication(s) Hilar Mass, RUL Lung Mass  Consent Risks of the procedure as well as the alternatives and risks of each were explained to the patient and/or caregiver.  Consent for the procedure was obtained.  Anesthesia General Anesthesia   Time Out Verified patient identification, verified procedure, site/side was marked, verified correct patient position, special equipment/implants available, medications/allergies/relevant history reviewed, required imaging and test results available.   Sterile Technique Usual hand hygiene, masks, gowns, and gloves were used   Procedure Description Diagnostic bronchoscope advanced through endotracheal tube and into airway.  Airways were examined down to subsegmental level with findings noted below.  The diagnostic bronchoscope was then removed and the EBUS bronchoscope was advanced into airway with stations 4R biopsied and sent for slide, cell block, and/or culture.  The EBUS bronchoscope was removed after assuring no active bleeding from biopsy site.  Findings:  - Tumor invasion of the right upper lobe entrance - Large 4R tumor involvement, 7 TBNA passes made at this location   Complications/Tolerance None; patient tolerated the procedure well. Chest X-ray is needed post procedure.   EBL Minimal   Specimen(s) TBNA of station 4R/Mass sent for cytology and cell block Brushing of RUL entrance send for cytology

## 2020-02-08 NOTE — Anesthesia Postprocedure Evaluation (Signed)
Anesthesia Post Note  Patient: Adam [name]  Procedure(s) Performed: VIDEO BRONCHOSCOPY WITH ENDOBRONCHIAL ULTRASOUND (N/A ) BRONCHIAL NEEDLE ASPIRATION BIOPSIES BRONCHIAL BRUSHINGS     Patient location during evaluation: PACU Anesthesia Type: General Level of consciousness: awake and alert Pain management: pain level controlled Vital Signs Assessment: post-procedure vital signs reviewed and stable Respiratory status: spontaneous breathing, nonlabored ventilation and respiratory function stable Cardiovascular status: blood pressure returned to baseline and stable Postop Assessment: no apparent nausea or vomiting Anesthetic complications: no   No complications documented.  Last Vitals:  Vitals:   02/08/20 1432 02/08/20 1445  BP: 118/75 110/66  Pulse: 77 78  Resp: 13 17  Temp:  36.6 C  SpO2: 100% 100%    Last Pain:  Vitals:   02/08/20 1432  TempSrc:   PainSc: 0-No pain                 Audry Pili

## 2020-02-08 NOTE — Transfer of Care (Signed)
Immediate Anesthesia Transfer of Care Note  Patient: Adam [name]  Procedure(s) Performed: VIDEO BRONCHOSCOPY WITH ENDOBRONCHIAL ULTRASOUND (N/A ) BRONCHIAL NEEDLE ASPIRATION BIOPSIES BRONCHIAL BRUSHINGS  Patient Location: Endoscopy Unit  Anesthesia Type:General  Level of Consciousness: drowsy, patient cooperative and responds to stimulation  Airway & Oxygen Therapy: Patient Spontanous Breathing  Post-op Assessment: Report given to RN, Post -op Vital signs reviewed and stable and Patient moving all extremities  Post vital signs: Reviewed and stable  Last Vitals:  Vitals Value Taken Time  BP 136/82 02/08/20 1412  Temp 36.9 C 02/08/20 1412  Pulse 81 02/08/20 1413  Resp 17 02/08/20 1413  SpO2 100 % 02/08/20 1413  Vitals shown include unvalidated device data.  Last Pain:  Vitals:   02/08/20 1412  TempSrc: Axillary  PainSc: 0-No pain         Complications: No complications documented.

## 2020-02-08 NOTE — Anesthesia Preprocedure Evaluation (Addendum)
Anesthesia Evaluation  Patient identified by MRN, date of birth, ID band Patient awake    Reviewed: Allergy & Precautions, NPO status , Patient's Chart, lab work & pertinent test results  History of Anesthesia Complications Negative for: history of anesthetic complications  Airway Mallampati: II  TM Distance: >3 FB Neck ROM: Full    Dental  (+) Dental Advisory Given, Edentulous Upper   Pulmonary former smoker,   Lung mass    Pulmonary exam normal        Cardiovascular hypertension, Pt. on medications Normal cardiovascular exam     Neuro/Psych negative neurological ROS  negative psych ROS   GI/Hepatic negative GI ROS, Neg liver ROS,   Endo/Other  negative endocrine ROS  Renal/GU negative Renal ROS     Musculoskeletal negative musculoskeletal ROS (+)   Abdominal   Peds  Hematology negative hematology ROS (+)   Anesthesia Other Findings Covid test negative   Reproductive/Obstetrics                            Anesthesia Physical Anesthesia Plan  ASA: II  Anesthesia Plan: General   Post-op Pain Management:    Induction: Intravenous  PONV Risk Score and Plan: 2 and Treatment may vary due to age or medical condition, Ondansetron, Dexamethasone and Midazolam  Airway Management Planned: Oral ETT  Additional Equipment: None  Intra-op Plan:   Post-operative Plan: Extubation in OR  Informed Consent: I have reviewed the patients History and Physical, chart, labs and discussed the procedure including the risks, benefits and alternatives for the proposed anesthesia with the patient or authorized representative who has indicated his/her understanding and acceptance.     Dental advisory given  Plan Discussed with: CRNA and Anesthesiologist  Anesthesia Plan Comments:        Anesthesia Quick Evaluation

## 2020-02-08 NOTE — Anesthesia Procedure Notes (Signed)
Procedure Name: Intubation Date/Time: 02/08/2020 1:04 PM Performed by: Rande Brunt, CRNA Pre-anesthesia Checklist: Patient identified, Emergency Drugs available, Suction available and Patient being monitored Patient Re-evaluated:Patient Re-evaluated prior to induction Oxygen Delivery Method: Circle System Utilized Preoxygenation: Pre-oxygenation with 100% oxygen Induction Type: IV induction Ventilation: Mask ventilation without difficulty Laryngoscope Size: Mac and 4 Grade View: Grade II Tube type: Oral Tube size: 8.5 mm Number of attempts: 1 Airway Equipment and Method: Stylet and Oral airway Placement Confirmation: ETT inserted through vocal cords under direct vision,  positive ETCO2 and breath sounds checked- equal and bilateral Secured at: 24 cm Tube secured with: Tape Dental Injury: Teeth and Oropharynx as per pre-operative assessment

## 2020-02-08 NOTE — Plan of Care (Signed)

## 2020-02-08 NOTE — Discharge Summary (Signed)
Physician Discharge Summary  Adam [name] [name] DOB: 1955/12/06 DOA: 02/06/2020  PCP: Patient, No Pcp Per  Admit date: 02/06/2020 Discharge date: 02/08/2020 30 Day Unplanned Readmission Risk Score   Flowsheet Row ED to Hosp-Admission (Current) from 02/06/2020 in [facility]  30 Day Unplanned Readmission Risk Score (%) 8.12 Filed at 02/08/2020 1200     This score is the patient's risk of an unplanned readmission within 30 days of being discharged (0 -100%). The score is based on dignosis, age, lab data, medications, orders, and past utilization.   Low:  0-14.9   Medium: 15-21.9   High: 22-29.9   Extreme: 30 and above         Admitted From: Home Disposition: Home  Recommendations for Outpatient Follow-up:  1. Follow up with PCP in 1-2 weeks 2. Follow-up with oncology/they will follow with you, please call oncology if you do not hear from them in the next 2 to 3 days. 3. Please obtain BMP/CBC in one week 4. Please follow up with your PCP on the following pending results: Unresulted Labs (From admission, onward)         None        Home Health: None Equipment/Devices: None  Discharge Condition: Stable CODE STATUS: Full code Diet recommendation: Cardiac  Subjective: Seen and examined earlier today.  He had no complaint.  He was excited to go home after the procedure.  Brief/Interim Summary: 65 year old male with HTN, tobacco use came into the hospital with multiple episodes of hemoptysis.  He has had this for the past couple of months, he initially felt it was due to the fact that he had this teeth pulled out several weeks ago, however surgery has healed and he kept having bloody sputum and eventually decided to come to the hospital.  Work-up in the ED revealed a new left lung mass.  He was admitted to hospital service.  He was seen by oncology who recommended biopsy of the mass.  Pulmonology was consulted and patient underwent flexible bronchoscopy  and EBUS bronchoscopy today by Dr. Erin Fulling.  Oncology has documented in their notes that they will follow-up with patient after biopsy results.  Pulmonology cleared the patient for discharge after few hours of rest today.  He will be discharged in stable condition.  No changes in medications.  Discharge Diagnoses:  Principal Problem:   Hemoptysis Active Problems:   Primary cancer of right upper lobe of lung ([place])   HTN (hypertension)    Discharge Instructions   Allergies as of 02/08/2020   No Known Allergies     Medication List    TAKE these medications   amLODipine 10 MG tablet Commonly known as: NORVASC Take 10 mg by mouth daily.   lisinopril-hydrochlorothiazide 20-12.5 MG tablet Commonly known as: ZESTORETIC Take 1 tablet by mouth daily.       No Known Allergies  Consultations: Oncology and pulmonology   Procedures/Studies: DG Chest 2 View  Result Date: 02/06/2020 CLINICAL DATA:  Shortness of breath with exertion. Hemoptysis last night. Ex-smoker. EXAM: CHEST - 2 VIEW COMPARISON:  Earlier today. FINDINGS: Again demonstrated is a large suprahilar/mediastinal mass with enlargement of the adjacent superior right hilum. A small amount of patchy and linear density in the right upper lobe is unchanged. Clear left lung. Normal sized heart. Tortuous and partially calcified thoracic aorta. Thoracic spine degenerative changes. IMPRESSION: 1. Stable large right suprahilar/mediastinal mass with enlargement of the adjacent superior right hilum. This remains concerning for malignancy. A  follow-up chest CT with contrast is recommended. 2. Stable mild right upper lobe scarring/bullous changes and possible pneumonia or postobstructive changes. Electronically Signed   By: Claudie Revering M.D.   On: 02/06/2020 16:16   DG Chest 2 View  Result Date: 02/06/2020 CLINICAL DATA:  65 year old male with a history of cough and bloody sputum EXAM: CHEST - 2 VIEW COMPARISON:  None available FINDINGS:  Cardiac diameter within normal limits. Lobular density in the right suprahilar region, with no comparison. This corresponds to opacity in the hilar and suprahilar region on the lateral view. Estimated diameter 8.4 cm. Patchy opacity peripheral to the hilar density in the right upper lobe with thickening of the minor fissure. No pleural effusion. No pneumothorax. IMPRESSION: Right suprahilar/mediastinal mass, concerning for malignancy, or less likely vascular pathology. Contrast-enhanced chest CT is recommended Electronically Signed   By: Corrie Mckusick D.O.   On: 02/06/2020 14:57   CT Chest W Contrast  Result Date: 02/06/2020 CLINICAL DATA:  Hemoptysis with right-sided suprahilar/mediastinal mass EXAM: CT CHEST WITH CONTRAST TECHNIQUE: Multidetector CT imaging of the chest was performed during intravenous contrast administration. CONTRAST:  46m OMNIPAQUE IOHEXOL 300 MG/ML  SOLN COMPARISON:  Chest x-ray from earlier in the same day. FINDINGS: Cardiovascular: Thoracic aorta shows a normal branching pattern. Scattered atherosclerotic calcifications are noted. Coronary calcifications are seen. The heart is not significantly enlarged. Pulmonary artery as visualized is within normal limits. Mediastinum/Nodes: Thoracic inlet is unremarkable. There is a large centrally necrotic mass lesion arising in the right suprahilar region and extending into the mediastinum adjacent to the trachea consistent with lymphadenopathy. This measures approximately 8.9 x 7.4 cm in greatest transverse and AP dimensions. This extends for at least 7.8 cm in craniocaudad projection. Extension into the right hilum is seen. Additionally in the right upper lobe there is a associated mass lesion which measures 2.9 x 2.6 cm. No sizable left hilar adenopathy is noted. The esophagus as visualized is within normal limits. Lungs/Pleura: Lungs are well aerated bilaterally and demonstrate some mild emphysematous changes. The left lung is clear. The right  lung as previously described demonstrates a spiculated 2.9 x 2.6 cm mass lesion in the upper lobe consistent with a primary pulmonary neoplasm till proven otherwise. This radiates to the right suprahilar adenopathy mass. No sizable effusion is seen. No other parenchymal nodule is noted. Upper Abdomen: Visualized upper abdomen demonstrates a 9 mm non-obstructing left renal stone. Adrenal glands are within normal limits. Vague hypodensity is noted within the liver consistent with a small cyst. No definitive metastatic lesions are seen. Musculoskeletal: Degenerative changes of the thoracic spine are seen. IMPRESSION: Right upper lobe mass lesion with associated large nodal mass involving the right suprahilar region and mediastinum in the paratracheal region consistent with primary pulmonary neoplasm and metastatic nodal involvement. Further workup to include tissue sampling is recommended. Nonobstructing left renal stone. Aortic Atherosclerosis (ICD10-I70.0) and Emphysema (ICD10-J43.9). Electronically Signed   By: MInez CatalinaM.D.   On: 02/06/2020 20:58   MR Brain W and Wo Contrast  Result Date: 02/07/2020 CLINICAL DATA:  Initial evaluation for metastatic disease. EXAM: MRI HEAD WITHOUT AND WITH CONTRAST TECHNIQUE: Multiplanar, multiecho pulse sequences of the brain and surrounding structures were obtained without and with intravenous contrast. CONTRAST:  982mGADAVIST GADOBUTROL 1 MMOL/ML IV SOLN COMPARISON:  None. FINDINGS: Brain: Mild age-related cerebral atrophy. Patchy and confluent T2/FLAIR hyperintensity within the periventricular and deep white matter both cerebral hemispheres, most consistent with chronic small vessel ischemic disease, mild in nature.  Superimposed remote lacunar infarct present at the right basal ganglia. Associated chronic hemosiderin staining. No abnormal foci of restricted diffusion to suggest acute or subacute ischemia. Gray-white matter differentiation maintained. No encephalomalacia  to suggest chronic cortical infarction elsewhere within the brain. No acute intracranial hemorrhage. Single punctate focus of susceptibility artifact noted within the central pons (series 14, image 20). Additional faint nodular enhancement seen at this level (series 18, image 20). Finding favored to reflect a small vascular structure. No other foci of susceptibility artifact to suggest chronic intracranial hemorrhage. No mass lesion, midline shift, or mass effect. No hydrocephalus or extra-axial fluid collection. Pituitary gland suprasellar region within normal limits. Midline structures intact and normal. No other abnormal enhancement or evidence for intracranial metastatic disease. Vascular: Major intracranial vascular flow voids are maintained. Skull and upper cervical spine: Craniocervical junction within normal limits. Bone marrow signal intensity normal. No focal marrow replacing lesion. Scalp soft tissues within normal limits. Sinuses/Orbits: Globes and orbital soft tissues within normal limits. Paranasal sinuses are largely clear. No mastoid effusion. Inner ear structures grossly normal. Other: None. IMPRESSION: 1. Single punctate focus of susceptibility artifact and faint nodular enhancement within the central pons as above. Finding favored to reflect a small vascular structure. Short interval follow-up MRI in 3 months suggested to document stability. 2. No other MRI evidence for intracranial metastatic disease. 3. 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 Electronically Signed   By: Jeannine Boga M.D.   On: 02/07/2020 02:55   CT ABDOMEN PELVIS W CONTRAST  Result Date: 02/07/2020 CLINICAL DATA:  Cancer of unknown primary question pulmonary malignancy by prior CT chest EXAM: CT ABDOMEN AND PELVIS WITH CONTRAST TECHNIQUE: Multidetector CT imaging of the abdomen and pelvis was performed using the  standard protocol following bolus administration of intravenous contrast. Sagittal and coronal MPR images reconstructed from axial data set. CONTRAST:  158m OMNIPAQUE IOHEXOL 300 MG/ML SOLN IV. No oral contrast. COMPARISON:  CT chest 02/06/2020 FINDINGS: Lower chest: Lung bases clear Hepatobiliary: Tiny cyst LEFT lobe liver 9 x 6 mm image 13. Gallbladder and liver otherwise normal appearance Pancreas: Normal appearance Spleen: Normal appearance. Two tiny splenule is noted inferior to spleen. Adrenals/Urinary Tract: 8 mm nonobstructing calculus inferior LEFT kidney image 30. Adrenal glands, kidneys, and ureters otherwise normal appearance. Bladder wall appears prominent but bladder is decompressed likely artifactual and contains excreted contrast from prior CT. Stomach/Bowel: Normal appendix. Stomach and bowel loops normal appearance Vascular/Lymphatic: Atherosclerotic calcifications aorta and iliac arteries. Aorta normal caliber. No adenopathy. Reproductive: Unremarkable Other: No free air or free fluid. Small umbilical hernia containing fat. Musculoskeletal: Bubbly mixed lytic and sclerotic lesion within the RIGHT femoral head of uncertain acuity, could represent a small enchondroma, metastasis considered less likely but not entirely excluded. Degenerative disc disease changes of thoracolumbar spine. No additional focal bone lesions. IMPRESSION: 8 mm nonobstructing calculus inferior LEFT kidney. Small umbilical hernia containing fat. Bubbly mixed lytic and sclerotic lesion within the RIGHT femoral head of uncertain acuity, could represent a small enchondroma, metastasis considered less likely but not entirely excluded. No other intra-abdominal or intrapelvic abnormalities. Aortic Atherosclerosis (ICD10-I70.0). Electronically Signed   By: MLavonia DanaM.D.   On: 02/07/2020 10:21      Discharge Exam: Vitals:   02/08/20 1200 02/08/20 1412  BP: 139/84 136/82  Pulse: 79 82  Resp: 14 19  Temp: 97.9 F (36.6  C) 98.5 F (36.9 C)  SpO2: 100% 100%  Vitals:   02/08/20 0206 02/08/20 1028 02/08/20 1200 02/08/20 1412  BP: 101/66 (!) 132/91 139/84 136/82  Pulse: 79 79 79 82  Resp: _0 Temp: 99.6 F (37.6 C) 98.6 F (37 C) 97.9 F (36.6 C) 98.5 F (36.9 C)  TempSrc: Oral Oral Axillary Axillary  SpO2: 99% 100% 100% 100%  Weight:   84.3 kg   Height:   _1  (1.753 m)     General: Pt is alert, awake, not in acute distress Cardiovascular: RRR, S1/S2 +, no rubs, no gallops Respiratory: CTA bilaterally, no wheezing, no rhonchi Abdominal: Soft, NT, ND, bowel sounds + Extremities: no edema, no cyanosis    The results of significant diagnostics from this hospitalization (including imaging, microbiology, ancillary and laboratory) are listed below for reference.     Microbiology: Recent Results (from the past 240 hour(s))  SARS CORONAVIRUS 2 (TAT 6-24 HRS) Nasopharyngeal Nasopharyngeal Swab     Status: None   Collection Time: 02/06/20  9:24 PM   Specimen: Nasopharyngeal Swab  Result Value Ref Range Status   SARS Coronavirus 2 NEGATIVE NEGATIVE Final    Comment: (NOTE) SARS-CoV-2 target nucleic acids are NOT DETECTED.  The SARS-CoV-2 RNA is generally detectable in upper and lower respiratory specimens during the acute phase of infection. Negative results do not preclude SARS-CoV-2 infection, do not rule out co-infections with other pathogens, and should not be used as the sole basis for treatment or other patient management decisions. Negative results must be combined with clinical observations, patient history, and epidemiological information. The expected result is Negative.  Fact Sheet for Patients: SugarRoll.be  Fact Sheet for Healthcare Providers: https://www.woods-mathews.com/  This test is not yet approved or cleared by the [place] FDA and  has been authorized for detection and/or diagnosis of SARS-CoV-2 by FDA under an  Emergency Use Authorization (EUA). This EUA will remain  in effect (meaning this test can be used) for the duration of the COVID-19 declaration under Se ction 564(b)(1) of the Act, 21 U.S.C. section 360bbb-3(b)(1), unless the authorization is terminated or revoked sooner.  Performed at [facility], [place] [address], [place], [place] [address]      Labs: BNP (last 3 results) No results for input(s): BNP in the last 8760 hours. Basic Metabolic Panel: Recent Labs  Lab 02/06/20 1600 02/07/20 0445  NA 137 137  K 4.0 4.0  CL 101 102  CO2 25 26  GLUCOSE 101* 112*  BUN 12 15  CREATININE 1.01 1.13  CALCIUM 9.8 9.5   Liver Function Tests: Recent Labs  Lab 02/07/20 0445  AST 21  ALT 21  ALKPHOS 62  BILITOT 0.7  PROT 7.0  ALBUMIN 3.2*   No results for input(s): LIPASE, AMYLASE in the last 168 hours. No results for input(s): AMMONIA in the last 168 hours. CBC: Recent Labs  Lab 02/06/20 1600 02/07/20 0445  WBC 8.5 8.7  HGB 13.8 13.1  HCT 42.3 41.8  MCV 82.1 84.1  PLT 354 346   Cardiac Enzymes: No results for input(s): CKTOTAL, CKMB, CKMBINDEX, TROPONINI in the last 168 hours. BNP: Invalid input(s): POCBNP CBG: No results for input(s): GLUCAP in the last 168 hours. D-Dimer No results for input(s): DDIMER in the last 72 hours. Hgb A1c No results for input(s): HGBA1C in the last 72 hours. Lipid Profile No results for input(s): CHOL, HDL, LDLCALC, TRIG, CHOLHDL, LDLDIRECT in the last 72 hours. Thyroid function studies No results for input(s): TSH, T4TOTAL, T3FREE, THYROIDAB in  the last 72 hours.  Invalid input(s): FREET3 Anemia work up No results for input(s): VITAMINB12, FOLATE, FERRITIN, TIBC, IRON, RETICCTPCT in the last 72 hours. Urinalysis    Component Value Date/Time   LABSPEC >=1.030 01/20/2020 1330   PHURINE 5.5 01/20/2020 1330   GLUCOSEU NEGATIVE 01/20/2020 1330   HGBUR LARGE (A) 01/20/2020 1330   BILIRUBINUR NEGATIVE 01/20/2020 1330    KETONESUR NEGATIVE 01/20/2020 1330   PROTEINUR 100 (A) 01/20/2020 1330   UROBILINOGEN 1.0 01/20/2020 1330   NITRITE POSITIVE (A) 01/20/2020 1330   LEUKOCYTESUR TRACE (A) 01/20/2020 1330   Sepsis Labs Invalid input(s): PROCALCITONIN,  WBC,  LACTICIDVEN Microbiology Recent Results (from the past 240 hour(s))  SARS CORONAVIRUS 2 (TAT 6-24 HRS) Nasopharyngeal Nasopharyngeal Swab     Status: None   Collection Time: 02/06/20  9:24 PM   Specimen: Nasopharyngeal Swab  Result Value Ref Range Status   SARS Coronavirus 2 NEGATIVE NEGATIVE Final    Comment: (NOTE) SARS-CoV-2 target nucleic acids are NOT DETECTED.  The SARS-CoV-2 RNA is generally detectable in upper and lower respiratory specimens during the acute phase of infection. Negative results do not preclude SARS-CoV-2 infection, do not rule out co-infections with other pathogens, and should not be used as the sole basis for treatment or other patient management decisions. Negative results must be combined with clinical observations, patient history, and epidemiological information. The expected result is Negative.  Fact Sheet for Patients: SugarRoll.be  Fact Sheet for Healthcare Providers: https://www.woods-mathews.com/  This test is not yet approved or cleared by the [place] FDA and  has been authorized for detection and/or diagnosis of SARS-CoV-2 by FDA under an Emergency Use Authorization (EUA). This EUA will remain  in effect (meaning this test can be used) for the duration of the COVID-19 declaration under Se ction 564(b)(1) of the Act, 21 U.S.C. section 360bbb-3(b)(1), unless the authorization is terminated or revoked sooner.  Performed at [facility], [place] [address] [place], [place] [address]      Time coordinating discharge: Over 30 minutes  SIGNED:   Darliss Cheney, MD  [facility] 02/08/2020, 2:18 PM  If 7PM-7AM, please contact  night-coverage [email or web address]

## 2020-02-08 NOTE — Discharge Instructions (Signed)
Lung Mass  A lung mass is a growth in the lung that is larger than 1.2 inches (3 cm). Smaller growths are called nodules. Most lung, or pulmonary, nodules are not cancer. Lung masses have a higher risk of being cancer than nodules do. A lung mass is sometimes found during a routine chest X-ray or while other imaging tests are done to check for other problems. Lung masses include:  Tumors. These may be cancerous (malignant) or noncancerous (benign).  Granulomas. These are infectious masses. They are caused by inflammation from bacterial infections, such as tuberculosis, or from fungal infections.  Noninfectious masses. Some diseases that cause lung inflammation may also cause lung masses to form.  Blood vessel malformations. What are the causes? This condition may be caused by:  A bacterial, fungal, or viral infection, such as tuberculosis. The infection is usually an old and inactive one.  Cancerous tissue, such as lung cancer or a cancer in another part of the body that has spread to the lung.  A noncancerous mass of tissue.  Inflammation from conditions such as rheumatoid arthritis.  Abnormal blood vessels in the lungs. What are the signs or symptoms? Usually, there are no symptoms of this condition. If symptoms appear, they are usually related to the underlying cause or due to pressure on surrounding tissue. For example, if the condition is caused by an infection, you may have a cough or a fever. How is this diagnosed? This condition may be diagnosed based on testing to find the cause of the mass. If a lung mass is found, you may have:  A physical exam.  Blood tests.  Imaging tests. These may include: ? Chest X-rays. ? CT scan. This test shows smaller pulmonary nodules more clearly and with more detail than an X-ray. ? Positron emission tomography (PET) scan. This test is done to check if a nodule or mass is cancerous. During the test, a small amount of a radioactive substance  is injected into the bloodstream. Then a picture is taken.  Biopsy to evaluate for cancer or confirm a diagnosis. This procedure involves removing a tissue sample from the mass by: ? Inserting a needle through the chest. ? Using a scope placed down into the lung. ? Doing open surgery. Tests and physical exams may be done once, or they may be done regularly for a period of time. Tests and exams that are done regularly will help monitor whether the mass is growing and becoming a concern. How is this treated? Treatment for a lung mass depends on the cause and whether the mass is cancerous. Treatment options for a cancerous lung mass may include:  Surgical removal.  Radiation therapy. This therapy uses high-energy X-rays to kill cancer cells.  Chemotherapy. This therapy uses medicines to slow down or stop the growth of cancer.  Targeted therapy, if the mass is cancerous. In this therapy, medicines are used that directly fight cancer cells and do not harm normal cells.  Immunotherapy, if the mass is cancerous. This therapy uses medicines that help your disease-fighting system (immune system) fight cancer cells. Noncancerous masses may require treatment specific to the cause. Follow these instructions at home: If you have had surgery or a biopsy, your health care provider will give you specific instructions for taking care of yourself at home after your procedure. Follow these instructions carefully. General home care instructions include:  Take over-the-counter and prescription medicines only as told by your health care provider.  Return to your normal activities as  told by your health care provider. Ask your health care provider what activities are safe for you.  Do not use any products that contain nicotine or tobacco. These products include cigarettes, chewing tobacco, and vaping devices, such as e-cigarettes. If you need help quitting, ask your health care provider.  Keep all follow-up  visits. This is important. Contact a health care provider if:  You have pain in your chest, back, or shoulder.  You are short of breath or have trouble breathing when you are active.  You develop a cough, or you develop hoarseness for an unexplained reason.  You feel sick or unusually tired.  You do not feel like eating, or you lose weight without trying.  You get chills or night sweats.  You need two or more pillows to sleep on at night.  You have any of these problems: ? A fever and symptoms that suddenly get worse. ? A fever or persistent symptoms for more than 2-3 days. Get help right away if:  You cannot catch your breath.  You have sudden chest pain.  You start making high-pitched whistling sounds when you breathe, most often when you breathe out (you wheeze).  You cannot stop coughing, or you cough up blood or bloody mucus from your lungs (sputum).  You become dizzy or feel like you may faint. These symptoms may represent a serious problem that is an emergency. Do not wait to see if the symptoms will go away. Get medical help right away. Call your local emergency services (911 in the [place] Do not drive yourself to the hospital. Summary  A lung mass is a growth in the lung that is larger than 1.2 inches (3 cm).  Lung masses have a higher risk of being cancer than do smaller growths.  Sometimes a lung mass is found during a routine chest X-ray or other imaging test.  Your health care provider may remove a tissue sample of the lung mass for testing (do a biopsy) to rule out or confirm cancer.  Treatment for this condition depends on the cause. This information is not intended to replace advice given to you by your health care provider. Make sure you discuss any questions you have with your health care provider. Document Revised: 07/15/2019 Document Reviewed: 07/15/2019 Elsevier Patient Education  [place].

## 2020-02-09 ENCOUNTER — Telehealth: Payer: Self-pay | Admitting: Hematology and Oncology

## 2020-02-09 ENCOUNTER — Encounter (HOSPITAL_COMMUNITY): Payer: Self-pay | Admitting: Pulmonary Disease

## 2020-02-09 NOTE — Telephone Encounter (Signed)
I received a sch msg to make an appt for Mr. Adam [name] an appt for a hospital f/u. He has been scheduled to see Dr. Chryl Heck on 2/3 at 12:30pm w/labs at 1:15pm. I cld and left the appt date and time on his vm.

## 2020-02-10 ENCOUNTER — Other Ambulatory Visit: Payer: Self-pay | Admitting: Hematology and Oncology

## 2020-02-10 ENCOUNTER — Telehealth: Payer: Self-pay | Admitting: Pulmonary Disease

## 2020-02-10 DIAGNOSIS — C3490 Malignant neoplasm of unspecified part of unspecified bronchus or lung: Secondary | ICD-10-CM

## 2020-02-10 DIAGNOSIS — C3411 Malignant neoplasm of upper lobe, right bronchus or lung: Secondary | ICD-10-CM | POA: Insufficient documentation

## 2020-02-10 LAB — CYTOLOGY - NON PAP

## 2020-02-10 MED ORDER — LORAZEPAM 0.5 MG PO TABS: 0.5000 mg | ORAL_TABLET | Freq: Four times a day (QID) | ORAL | 0 refills | Status: DC | PRN

## 2020-02-10 MED ORDER — PROCHLORPERAZINE MALEATE 10 MG PO TABS: 10.0000 mg | ORAL_TABLET | Freq: Four times a day (QID) | ORAL | 1 refills | Status: DC | PRN

## 2020-02-10 MED ORDER — ONDANSETRON HCL 8 MG PO TABS: 8.0000 mg | ORAL_TABLET | Freq: Two times a day (BID) | ORAL | 1 refills | Status: DC | PRN

## 2020-02-10 MED ORDER — DEXAMETHASONE 4 MG PO TABS: 8.0000 mg | ORAL_TABLET | Freq: Every day | ORAL | 1 refills | Status: DC

## 2020-02-10 MED ORDER — LIDOCAINE-PRILOCAINE 2.5-2.5 % EX CREA: TOPICAL_CREAM | CUTANEOUS | 3 refills | Status: DC

## 2020-02-10 NOTE — Telephone Encounter (Signed)
Called [facility] and spoke with Dr. Vic Ripper. Dr. Vic Ripper reported lymph node, malignant small cell carcinoma.  02/08/20-Cytology  CYTOLOGY - NON PAP  CASE: [id number]  PATIENT: Adam [name]  Non-Gynecological Cytology Report    Clinical History: Lung Mass   FINAL MICROSCOPIC DIAGNOSIS:   A. LYMPH NODE, 4R MASS, FINE NEEDLE ASPIRATION:  - Malignant cells consistent with small cell carcinoma, see comment   B. LUNG, RUL, BRUSHING:  - No malignant cells identified     COMMENT:   A. Dr. Saralyn Pilar reviewed the case and concurs with the diagnosis. Dr.  Erin Fulling was paged on 02/10/2020.   GROSS:  Received is/are (A) (1) 2 Slides (1 Quick Stain). (2) 2 Slides (1 Quick  Stain). Also received are 16cc's of cloudy, red saline solution from  needle rinses. (B) Received 35cc's of clear, colorless saline solution  with a brush. (MA:MW:mw)  Smears: (A) 4 (B) 0  Concentration Method (Thin Prep): (A) 1 (B) 0  Cell Block: (A) 1 Conventional (B) 1 Conventional (Requested all for  cell block)  Additional Studies: N/A   Final Diagnosis performed by Jaquita Folds, MD.  Electronically  signed 02/10/2020   Report routed to Dr. Erin Fulling

## 2020-02-10 NOTE — Progress Notes (Unsigned)
Via treatment pathway in place.

## 2020-02-10 NOTE — Progress Notes (Signed)
START ON PATHWAY REGIMEN - Small Cell Lung     A cycle is every 21 days:     Etoposide      Cisplatin   **Always confirm dose/schedule in your pharmacy ordering system**  Patient Characteristics: Newly Diagnosed, Preoperative or Nonsurgical Candidate (Clinical Staging), First Line, Limited Stage, Nonsurgical Candidate Therapeutic Status: Newly Diagnosed, Preoperative or Nonsurgical Candidate (Clinical Staging) AJCC T Category: cT1c AJCC N Category: cN2 AJCC M Category: cM0 AJCC 8 Stage Grouping: IIIA Stage Classification: Limited Surgical Candidacy: Nonsurgical Candidate Intent of Therapy: Curative Intent, Not Discussed with Patient

## 2020-02-11 ENCOUNTER — Ambulatory Visit
Admission: RE | Admit: 2020-02-11 | Discharge: 2020-02-11 | Disposition: A | Discharge status: Home / Self Care | Payer: 59 | Source: Ambulatory Visit | Attending: Radiation Oncology | Admitting: Radiation Oncology

## 2020-02-11 ENCOUNTER — Other Ambulatory Visit: Payer: Self-pay | Admitting: *Deleted

## 2020-02-11 ENCOUNTER — Encounter: Payer: Self-pay | Admitting: *Deleted

## 2020-02-11 ENCOUNTER — Encounter: Payer: Self-pay | Admitting: Hematology and Oncology

## 2020-02-11 ENCOUNTER — Other Ambulatory Visit: Payer: Self-pay

## 2020-02-11 ENCOUNTER — Inpatient Hospital Stay: Payer: 59 | Attending: Hematology and Oncology | Admitting: Hematology and Oncology

## 2020-02-11 ENCOUNTER — Ambulatory Visit: Payer: 59 | Admitting: Radiation Oncology

## 2020-02-11 ENCOUNTER — Inpatient Hospital Stay: Payer: 59

## 2020-02-11 ENCOUNTER — Encounter: Payer: Self-pay | Admitting: Radiation Oncology

## 2020-02-11 VITALS — BP 135/87 | HR 86 | Temp 97.8°F | Resp 20 | Ht 69.0 in | Wt 177.8 lb

## 2020-02-11 DIAGNOSIS — J439 Emphysema, unspecified: Secondary | ICD-10-CM | POA: Insufficient documentation

## 2020-02-11 DIAGNOSIS — Z5111 Encounter for antineoplastic chemotherapy: Secondary | ICD-10-CM | POA: Diagnosis present

## 2020-02-11 DIAGNOSIS — M47814 Spondylosis without myelopathy or radiculopathy, thoracic region: Secondary | ICD-10-CM | POA: Diagnosis not present

## 2020-02-11 DIAGNOSIS — C3411 Malignant neoplasm of upper lobe, right bronchus or lung: Secondary | ICD-10-CM

## 2020-02-11 DIAGNOSIS — Z79899 Other long term (current) drug therapy: Secondary | ICD-10-CM | POA: Insufficient documentation

## 2020-02-11 DIAGNOSIS — R634 Abnormal weight loss: Secondary | ICD-10-CM | POA: Insufficient documentation

## 2020-02-11 DIAGNOSIS — I10 Essential (primary) hypertension: Secondary | ICD-10-CM | POA: Insufficient documentation

## 2020-02-11 DIAGNOSIS — C3490 Malignant neoplasm of unspecified part of unspecified bronchus or lung: Secondary | ICD-10-CM

## 2020-02-11 DIAGNOSIS — N200 Calculus of kidney: Secondary | ICD-10-CM | POA: Diagnosis not present

## 2020-02-11 DIAGNOSIS — R0602 Shortness of breath: Secondary | ICD-10-CM | POA: Insufficient documentation

## 2020-02-11 DIAGNOSIS — I700 Atherosclerosis of aorta: Secondary | ICD-10-CM | POA: Insufficient documentation

## 2020-02-11 DIAGNOSIS — Z809 Family history of malignant neoplasm, unspecified: Secondary | ICD-10-CM | POA: Insufficient documentation

## 2020-02-11 DIAGNOSIS — Z87891 Personal history of nicotine dependence: Secondary | ICD-10-CM | POA: Insufficient documentation

## 2020-02-11 DIAGNOSIS — K429 Umbilical hernia without obstruction or gangrene: Secondary | ICD-10-CM | POA: Insufficient documentation

## 2020-02-11 DIAGNOSIS — I2510 Atherosclerotic heart disease of native coronary artery without angina pectoris: Secondary | ICD-10-CM | POA: Insufficient documentation

## 2020-02-11 HISTORY — DX: Malignant neoplasm of unspecified part of unspecified bronchus or lung: C34.90

## 2020-02-11 LAB — CBC WITH DIFFERENTIAL/PLATELET
Abs Immature Granulocytes: 0.01 10*3/uL (ref 0.00–0.07)
Basophils Absolute: 0.1 10*3/uL (ref 0.0–0.1)
Basophils Relative: 1 %
Eosinophils Absolute: 0.2 10*3/uL (ref 0.0–0.5)
Eosinophils Relative: 2 %
HCT: 38.1 % — ABNORMAL LOW (ref 39.0–52.0)
Hemoglobin: 12.3 g/dL — ABNORMAL LOW (ref 13.0–17.0)
Immature Granulocytes: 0 %
Lymphocytes Relative: 32 %
Lymphs Abs: 2.6 10*3/uL (ref 0.7–4.0)
MCH: 26.5 pg (ref 26.0–34.0)
MCHC: 32.3 g/dL (ref 30.0–36.0)
MCV: 82.1 fL (ref 80.0–100.0)
Monocytes Absolute: 0.8 10*3/uL (ref 0.1–1.0)
Monocytes Relative: 10 %
Neutro Abs: 4.4 10*3/uL (ref 1.7–7.7)
Neutrophils Relative %: 55 %
Platelets: 309 10*3/uL (ref 150–400)
RBC: 4.64 MIL/uL (ref 4.22–5.81)
RDW: 14.6 % (ref 11.5–15.5)
WBC: 8.1 10*3/uL (ref 4.0–10.5)
nRBC: 0 % (ref 0.0–0.2)

## 2020-02-11 LAB — COMPREHENSIVE METABOLIC PANEL
ALT: 28 U/L (ref 0–44)
AST: 18 U/L (ref 15–41)
Albumin: 3.4 g/dL — ABNORMAL LOW (ref 3.5–5.0)
Alkaline Phosphatase: 69 U/L (ref 38–126)
Anion gap: 4 — ABNORMAL LOW (ref 5–15)
BUN: 13 mg/dL (ref 8–23)
CO2: 31 mmol/L (ref 22–32)
Calcium: 9.8 mg/dL (ref 8.9–10.3)
Chloride: 106 mmol/L (ref 98–111)
Creatinine, Ser: 1.19 mg/dL (ref 0.61–1.24)
GFR, Estimated: >60 mL/min (ref >60)
Glucose, Bld: 113 mg/dL — ABNORMAL HIGH (ref 70–99)
Potassium: 4.4 mmol/L (ref 3.5–5.1)
Sodium: 141 mmol/L (ref 135–145)
Total Bilirubin: 0.3 mg/dL (ref 0.3–1.2)
Total Protein: 7.5 g/dL (ref 6.5–8.1)

## 2020-02-11 LAB — MAGNESIUM: Magnesium: 2 mg/dL (ref 1.7–2.4)

## 2020-02-11 MED ORDER — DEXAMETHASONE 4 MG PO TABS: 8.0000 mg | ORAL_TABLET | Freq: Every day | ORAL | 1 refills | Status: DC

## 2020-02-11 NOTE — Progress Notes (Signed)
[place]         [phone] ________________________________  Initial Outpatient Consultation  Name: Adam [name] MRN: [id number]  Date: 02/11/2020  DOB: Jul 09, 1955  REFERRING PHYSICIAN: Benay Pike, MD  DIAGNOSIS: 65 yo gentleman with hemoptysis from T1 N2 M0 small cell carcinoma, Limited Stage, Stage IIIA    ICD-10-CM   1. Primary cancer of right upper lobe of lung (HCC)  C34.11   2. Small cell lung cancer in adult [facility])  C34.90     HISTORY OF PRESENT ILLNESS::Adam [name] is a 65 y.o. male who presented with hemoptysis for the past 3 months. Imaging showed RUL lesion with associated large nodal mass involving right suprahilar region and mediastinum in the paratracheal region consistent with primary pulmonary neoplasm and metastatic nodal involvement.    No clear evidence of metastatic disease on brain MRI, CT Chest/Abd/Pelvis.  He underwent bronchoscopy with biopsy on 02/08/20 showing small cell lung cancer.  He has kindly been referred today to discuss radiation treatment options.  PREVIOUS RADIATION THERAPY: No  Past Medical History:  Diagnosis Date  . Hypertension   . Lung cancer ([place])    small cell RUL  :   Past Surgical History:  Procedure Laterality Date  . BRONCHIAL BRUSHINGS  02/08/2020   Procedure: BRONCHIAL BRUSHINGS;  Surgeon: Freddi Starr, MD;  Location: [facility];  Service: Pulmonary;;  . BRONCHIAL NEEDLE ASPIRATION BIOPSY  02/08/2020   Procedure: BRONCHIAL NEEDLE ASPIRATION BIOPSIES;  Surgeon: Freddi Starr, MD;  Location[facility] ENDOSCOPY;  Service: Pulmonary;;  . KNEE SURGERY Right   . SHOULDER SURGERY Left   . VIDEO BRONCHOSCOPY WITH ENDOBRONCHIAL ULTRASOUND N/A 02/08/2020   Procedure: VIDEO BRONCHOSCOPY WITH ENDOBRONCHIAL ULTRASOUND;  Surgeon: Freddi Starr, MD;  Location: [facility];  Service: Pulmonary;  Laterality: N/A;  Fa Current Outpatient Medications:  .  amLODipine (NORVASC) 10 MG tablet, Take 10 mg  by mouth daily., Disp: , Rfl:  .  lisinopril-hydrochlorothiazide (ZESTORETIC) 20-12.5 MG tablet, Take 1 tablet by mouth daily., Disp: , Rfl:  .  dexamethasone (DECADRON) 4 MG tablet, Take 2 tablets (8 mg total) by mouth daily. Take for 1 day starting the day after chemotherapy on day 4., Disp: 30 tablet, Rfl: 1 .  ibuprofen (ADVIL) 600 MG tablet, Take 600 mg by mouth every 6 (six) hours as needed. (Patient not taking: No sig reported), Disp: , Rfl:  .  lidocaine-prilocaine (EMLA) cream, Apply to affected area once (Patient not taking: No sig reported), Disp: 30 g, Rfl: 3 .  LORazepam (ATIVAN) 0.5 MG tablet, Take 1 tablet (0.5 mg total) by mouth every 6 (six) hours as needed (Nausea or vomiting). (Patient not taking: No sig reported), Disp: 30 tablet, Rfl: 0 .  ondansetron (ZOFRAN) 8 MG tablet, Take 1 tablet (8 mg total) by mouth 2 (two) times daily as needed. Start on the third day after cisplatin chemotherapy. (Patient not taking: No sig reported), Disp: 30 tablet, Rfl: 1 .  prochlorperazine (COMPAZINE) 10 MG tablet, Take 1 tablet (10 mg total) by mouth every 6 (six) hours as needed (Nausea or vomiting). (Patient not taking: No sig reported), Disp: 30 tablet, Rfl: 1 No current facility-administered medications for this encounter.  Facility-Administered Medications Ordered in Other Encounters:  .  CISplatin (PLATINOL) 162 mg in sodium chloride 0.9 % 500 mL chemo infusion, 80 mg/m2 (Treatment Plan Recorded), Intravenous, Once, Iruku, Praveena, MD .  etoposide (VEPESID) 200 mg in sodium chloride 0.9 % 500 mL chemo infusion,  100 mg/m2 (Treatment Plan Recorded), Intravenous, Once, Iruku, Praveena, MDmakNo Known Allergiess.   Family History  Problem Relation Age of Onset  . Heart disease Mother   . Cancer Cousin   . Breast cancer Neg Hx   . Colon cancer Neg Hx   . Pancreatic cancer Neg Hx   . Prostate cancer Neg Hx   He had a dental extraction 3 months ago and soon after noticed scant hemoptysis  which he attributed to the dental extraction.   PHYSICAL EXAM:  Blood pressure 135/87, pulse 86, temperature 97.8 F (36.6 C), resp. rate 20, height '5\' 9"'  (1.753 m), weight 177 lb 12.8 oz (80.6 kg), SpO2 100 %.  NAD, comfortably breathing and neuro intact.   KPS = 80  100 - Normal; no complaints; no evidence of disease. 90   - Able to carry on normal activity; minor signs or symptoms of disease. 80   - Normal activity with effort; some signs or symptoms of disease. 82   - Cares for self; unable to carry on normal activity or to do active work. 60   - Requires occasional assistance, but is able to care for most of his personal needs. 50   - Requires considerable assistance and frequent medical care. 43   - Disabled; requires special care and assistance. 39   - Severely disabled; hospital admission is indicated although death not imminent. 40   - Very sick; hospital admission necessary; active supportive treatment necessary. 10   - Moribund; fatal processes progressing rapidly. 0     - Dead  Karnofsky DA, Abelmann [place], Craver LS and Burchenal JH [phone]) The use of the nitrogen mustards in the palliative treatment of carcinoma: with particular reference to bronchogenic carcinoma Cancer 1 634-56  LABORATORY DATA:  Lab Results  Component Value Date   WBC 7.8 02/16/2020   HGB 12.5 (L) 02/16/2020   HCT 38.9 (L) 02/16/2020   MCV 81.4 02/16/2020   PLT 283 02/16/2020   Lab Results  Component Value Date   NA 138 02/16/2020   K 3.6 02/16/2020   CL 108 02/16/2020   CO2 23 02/16/2020   Lab Results  Component Value Date   ALT 20 02/16/2020   AST 17 02/16/2020   ALKPHOS 68 02/16/2020   BILITOT 0.4 02/16/2020     RADIOGRAPHY: DG Chest 2 View  Result Date: 02/06/2020 CLINICAL DATA:  Shortness of breath with exertion. Hemoptysis last night. Ex-smoker. EXAM: CHEST - 2 VIEW COMPARISON:  Earlier today. FINDINGS: Again demonstrated is a large suprahilar/mediastinal mass with enlargement of  the adjacent superior right hilum. A small amount of patchy and linear density in the right upper lobe is unchanged. Clear left lung. Normal sized heart. Tortuous and partially calcified thoracic aorta. Thoracic spine degenerative changes. IMPRESSION: 1. Stable large right suprahilar/mediastinal mass with enlargement of the adjacent superior right hilum. This remains concerning for malignancy. A follow-up chest CT with contrast is recommended. 2. Stable mild right upper lobe scarring/bullous changes and possible pneumonia or postobstructive changes. Electronically Signed   By: Claudie Revering M.D.   On: 02/06/2020 16:16   DG Chest 2 View  Result Date: 02/06/2020 CLINICAL DATA:  65 year old male with a history of cough and bloody sputum EXAM: CHEST - 2 VIEW COMPARISON:  None available FINDINGS: Cardiac diameter within normal limits. Lobular density in the right suprahilar region, with no comparison. This corresponds to opacity in the hilar and suprahilar region on the lateral view. Estimated diameter 8.4 cm. Patchy opacity  peripheral to the hilar density in the right upper lobe with thickening of the minor fissure. No pleural effusion. No pneumothorax. IMPRESSION: Right suprahilar/mediastinal mass, concerning for malignancy, or less likely vascular pathology. Contrast-enhanced chest CT is recommended Electronically Signed   By: Corrie Mckusick D.O.   On: 02/06/2020 14:57   CT Chest W Contrast  Result Date: 02/06/2020 CLINICAL DATA:  Hemoptysis with right-sided suprahilar/mediastinal mass EXAM: CT CHEST WITH CONTRAST TECHNIQUE: Multidetector CT imaging of the chest was performed during intravenous contrast administration. CONTRAST:  25m OMNIPAQUE IOHEXOL 300 MG/ML  SOLN COMPARISON:  Chest x-ray from earlier in the same day. FINDINGS: Cardiovascular: Thoracic aorta shows a normal branching pattern. Scattered atherosclerotic calcifications are noted. Coronary calcifications are seen. The heart is not significantly  enlarged. Pulmonary artery as visualized is within normal limits. Mediastinum/Nodes: Thoracic inlet is unremarkable. There is a large centrally necrotic mass lesion arising in the right suprahilar region and extending into the mediastinum adjacent to the trachea consistent with lymphadenopathy. This measures approximately 8.9 x 7.4 cm in greatest transverse and AP dimensions. This extends for at least 7.8 cm in craniocaudad projection. Extension into the right hilum is seen. Additionally in the right upper lobe there is a associated mass lesion which measures 2.9 x 2.6 cm. No sizable left hilar adenopathy is noted. The esophagus as visualized is within normal limits. Lungs/Pleura: Lungs are well aerated bilaterally and demonstrate some mild emphysematous changes. The left lung is clear. The right lung as previously described demonstrates a spiculated 2.9 x 2.6 cm mass lesion in the upper lobe consistent with a primary pulmonary neoplasm till proven otherwise. This radiates to the right suprahilar adenopathy mass. No sizable effusion is seen. No other parenchymal nodule is noted. Upper Abdomen: Visualized upper abdomen demonstrates a 9 mm non-obstructing left renal stone. Adrenal glands are within normal limits. Vague hypodensity is noted within the liver consistent with a small cyst. No definitive metastatic lesions are seen. Musculoskeletal: Degenerative changes of the thoracic spine are seen. IMPRESSION: Right upper lobe mass lesion with associated large nodal mass involving the right suprahilar region and mediastinum in the paratracheal region consistent with primary pulmonary neoplasm and metastatic nodal involvement. Further workup to include tissue sampling is recommended. Nonobstructing left renal stone. Aortic Atherosclerosis (ICD10-I70.0) and Emphysema (ICD10-J43.9). Electronically Signed   By: MInez CatalinaM.D.   On: 02/06/2020 20:58   MR Brain W and Wo Contrast  Result Date: 02/07/2020 CLINICAL DATA:   Initial evaluation for metastatic disease. EXAM: MRI HEAD WITHOUT AND WITH CONTRAST TECHNIQUE: Multiplanar, multiecho pulse sequences of the brain and surrounding structures were obtained without and with intravenous contrast. CONTRAST:  939mGADAVIST GADOBUTROL 1 MMOL/ML IV SOLN COMPARISON:  None. FINDINGS: Brain: Mild age-related cerebral atrophy. Patchy and confluent T2/FLAIR hyperintensity within the periventricular and deep white matter both cerebral hemispheres, most consistent with chronic small vessel ischemic disease, mild in nature. Superimposed remote lacunar infarct present at the right basal ganglia. Associated chronic hemosiderin staining. No abnormal foci of restricted diffusion to suggest acute or subacute ischemia. Gray-white matter differentiation maintained. No encephalomalacia to suggest chronic cortical infarction elsewhere within the brain. No acute intracranial hemorrhage. Single punctate focus of susceptibility artifact noted within the central pons (series 14, image 20). Additional faint nodular enhancement seen at this level (series 18, image 20). Finding favored to reflect a small vascular structure. No other foci of susceptibility artifact to suggest chronic intracranial hemorrhage. No mass lesion, midline shift, or mass effect. No hydrocephalus or extra-axial  fluid collection. Pituitary gland suprasellar region within normal limits. Midline structures intact and normal. No other abnormal enhancement or evidence for intracranial metastatic disease. Vascular: Major intracranial vascular flow voids are maintained. Skull and upper cervical spine: Craniocervical junction within normal limits. Bone marrow signal intensity normal. No focal marrow replacing lesion. Scalp soft tissues within normal limits. Sinuses/Orbits: Globes and orbital soft tissues within normal limits. Paranasal sinuses are largely clear. No mastoid effusion. Inner ear structures grossly normal. Other: None. IMPRESSION: 1.  Single punctate focus of susceptibility artifact and faint nodular enhancement within the central pons as above. Finding favored to reflect a small vascular structure. Short interval follow-up MRI in 3 months suggested to document stability. 2. No other MRI evidence for intracranial metastatic disease. 3. 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 Electronically Signed   By: Jeannine Boga M.D.   On: 02/07/2020 02:55   CT ABDOMEN PELVIS W CONTRAST  Result Date: 02/07/2020 CLINICAL DATA:  Cancer of unknown primary question pulmonary malignancy by prior CT chest EXAM: CT ABDOMEN AND PELVIS WITH CONTRAST TECHNIQUE: Multidetector CT imaging of the abdomen and pelvis was performed using the standard protocol following bolus administration of intravenous contrast. Sagittal and coronal MPR images reconstructed from axial data set. CONTRAST:  159m OMNIPAQUE IOHEXOL 300 MG/ML SOLN IV. No oral contrast. COMPARISON:  CT chest 02/06/2020 FINDINGS: Lower chest: Lung bases clear Hepatobiliary: Tiny cyst LEFT lobe liver 9 x 6 mm image 13. Gallbladder and liver otherwise normal appearance Pancreas: Normal appearance Spleen: Normal appearance. Two tiny splenule is noted inferior to spleen. Adrenals/Urinary Tract: 8 mm nonobstructing calculus inferior LEFT kidney image 30. Adrenal glands, kidneys, and ureters otherwise normal appearance. Bladder wall appears prominent but bladder is decompressed likely artifactual and contains excreted contrast from prior CT. Stomach/Bowel: Normal appendix. Stomach and bowel loops normal appearance Vascular/Lymphatic: Atherosclerotic calcifications aorta and iliac arteries. Aorta normal caliber. No adenopathy. Reproductive: Unremarkable Other: No free air or free fluid. Small umbilical hernia containing fat. Musculoskeletal: [place] mixed lytic and sclerotic lesion within the RIGHT femoral head  of uncertain acuity, could represent a small enchondroma, metastasis considered less likely but not entirely excluded. Degenerative disc disease changes of thoracolumbar spine. No additional focal bone lesions. IMPRESSION: 8 mm nonobstructing calculus inferior LEFT kidney. Small umbilical hernia containing fat. [place]bly mixed lytic and sclerotic lesion within the RIGHT femoral head of uncertain acuity, could represent a small enchondroma, metastasis considered less likely but not entirely excluded. No other intra-abdominal or intrapelvic abnormalities. Aortic Atherosclerosis (ICD10-I70.0). Electronically Signed   By: MLavonia DanaM.D.   On: 02/07/2020 10:21   DG CHEST PORT 1 VIEW  Result Date: 02/08/2020 CLINICAL DATA:  Status post bronchoscopy and biopsy EXAM: PORTABLE CHEST 1 VIEW COMPARISON:  02/06/2020 FINDINGS: Stable heart size. Large right suprahilar/mediastinal mass and irregular right apical nodule/mass are grossly unchanged. Left lung is clear. No pneumothorax. IMPRESSION: 1. No pneumothorax status post bronchoscopy. 2. Large right suprahilar/mediastinal mass and irregular right apical nodule/mass. Electronically Signed   By: NDavina PokeD.O.   On: 02/08/2020 14:43      IMPRESSION: 65yo gentleman with hemoptysis from T1 N2 M0 small cell carcinoma, Limited Stage, Stage IIIA.  He would be eligible for a combination of chemotherapy and radiation treatment to the chest, and likely prophylactic cranial irradiation (PCI) later.  He appears to be eligible for a clinical research protocol NRG-LU005 A Phase III Randomized Study of Chemoradiation  Versus Chemoradiation Plus Atezolizumab (an Anti-PD-L1 monoclonal antibody)  PLAN:Today, I talked to the patient and family about the findings and work-up thus far.  We discussed the natural history of limited stage small cell lung cancer and general treatment, highlighting the role of radiotherapy in the management.  We discussed the available radiation  techniques, and focused on the details of logistics and delivery.  We reviewed the anticipated acute and late sequelae associated with radiation in this setting.  The patient was encouraged to ask questions that I answered to the best of my ability.  The patient would like to proceed with radiation and will be scheduled for CT simulation, following consideration of enrollment in the study.  I spent 60 minutes total in this encounter.     ------------------------------------------------   Tyler Pita, MD [place] Director and Director of Stereotactic Radiosurgery Direct Dial: [phone]  Fax: [phone] [email or web address]  Skype  [email or web address]

## 2020-02-11 NOTE — Progress Notes (Signed)
I followed up on Adam [name] schedule. He needs chemo education.  I completed scheduling message to schedule.

## 2020-02-11 NOTE — Research (Signed)
Trial:  [id number] TESTING THE ADDITION OF ATEZOLIZUMAB TO THE USUAL CHEMORADIATION TREATMENT FOR LIMITED STAGE SMALL CELL LUNG CANCER  02/11/2020 1335PM  Patient Adam [name] was identified by Dr. Chryl Heck as a potential candidate for the above listed study.  This Clinical Research Nurse met with [name], [id number], on 02/11/20 along with Dr. Chryl Heck in a manner and location that ensures patient privacy to discuss participation in the above listed research study.  Patient is Unaccompanied.  A copy of the informed consent document and separate HIPAA Authorization was provided to the patient.  Patient reads, speaks, and understands [place].   Patient was provided with the business card of this Nurse and encouraged to contact the research team with any questions.  Approximately 20 minutes were spent with the patient reviewing the informed consent documents.  Patient was provided the option of taking informed consent documents home to review and was encouraged to review at their convenience with their support network, including other care providers. Patient took the consent documents home to review. Current plan is for Yoniel to review the provided consent forms and [facility] research brochure and notify me directly with any questions. I asked if I have not heard from him by next week if it is ok to call and he verbalizes it is ok to call him. Ray understands that there is eligibility criteria that must be met for participation.  [name]. Sharlett Iles, BSN, RN, CIC 02/11/2020 2:18 PM

## 2020-02-11 NOTE — Progress Notes (Signed)
I received a message from Dr. Chryl Heck.  She would like research to see if patient qualifies for study. I contacted Erline Levine with research with an update.

## 2020-02-11 NOTE — Progress Notes (Signed)
[facility] CONSULT NOTE  Patient Care Team: Patient, No Pcp Per as PCP - General (General Practice) [name], RN as Registered Nurse  CHIEF COMPLAINTS/PURPOSE OF CONSULTATION:  Limited stage small cell lung cancer.  ASSESSMENT & PLAN:  No problem-specific Assessment & Plan notes found for this encounter.  No orders of the defined types were placed in this encounter.  This is a very pleasant 65 yr old male patient who presented with hemoptysis for the past 3 months. Imaging showed RUL lesion with associated large nodal mass involving right suprahilar region and mediastinum in the paratracheal region consistent with primary pulmonary neoplasm and metastatic nodal involvement. No clear evidence of metastatic disease. Pathology from bronchoscopy showed evidence of small cell lung cancer.  He is still considered to be a limited stage given no clear evidence of distant metastatic disease.  I think he is an excellent candidate for the following study which is a randomized trial comparing chemoradiation but chemoradiation plus atezolizumab in limited stage small cell lung cancer.  LIMITED STAGE SMALL CELL LUNG CANCER (LS-SCLC): A PHASE III  RANDOMIZED STUDY OF CHEMORADIATION VERSUS CHEMORADIATION PLUS ATEZOLIZUMAB (10-June-2019)  I have gone over the consent today with the patient, discussed side effects of chemotherapy and immunotherapy.  Discussed adverse effects including but not limited to fatigue, nausea, vomiting, diarrhea, increased risk of infections, ototoxicity, nephropathy with chemotherapy.  Discussed adverse effects of chemotherapy including but not limited to fatigue, dry skin, hypo-/hyperthyroidism, nephritis, hepatitis, myocarditis, hypophysitis etc.  Discussed that immunotherapy can eventually affect any organ but most of the side effects are mild to moderate and treatable.  Unfortunately side effects from treatment can very rarely be life-threatening. He  is agreeable to reviewing the consent and will let us know about his decision next week. In the interim, we will proceed with C1 of cisplatin/etoposide which is allowed in the protocol As mentioned above, I have discussed about the side effects of the treatment. We dont need a central line for this chemotherapy, so will plan to proceed with labs prior to treatment followed by first cycle of chemotherapy on 02/16/2020 RTC in a week to 10 days for FU. We will contact him in the interim to go over his interest in the clinical trial.  Thank you for consulting [place] in the care of this patient.  Please not hesitate to contact us with any additional questions or concerns.  HISTORY OF PRESENTING ILLNESS:  Adam [name] 65 y.o. male is here because of new diagnosis of limited stage small cell lung cancer.  Adam [name] 65 y.o. male is here because of hemoptysis  This is a 65 yr old male patient with PMH significant for HTN presented with chief complaint of hemoptysis. He had a dental extraction 3 months ago and soon after noticed scant hemoptysis which he attributed to the dental extraction. He then had two more episodes of scant hemoptysis, he describes it as pea size in his sputum. He may have noticed worsening SOB in the past 3 months but not significant. He lost about 10 lbs of weight in the past several months. He denies any chest pain or worsening cough. No other complaints. No change in bowel or urinary habits. He smoked 20 PPY, quit about 2 yrs ago. He worked as a Probation officer, Theme park manager.  CT chest 02/06/2020 showed right upper lobe mass lesion with associated large nodal mass involving the right suprahilar region and mediastinum in the paratracheal region consistent with  primary pulmonary neoplasm and metastatic nodal involvement. CT abdomen pelvis from 02/07/2020 showed an 8 mm nonobstructing calculus in the left kidney, small umbilical hernia, bubbly mixed lytic and sclerotic lesion  within the right femoral head of uncertain acuity could represent a small enchondroma, metastatic is considered less likely but not entirely excluded. MRI brain from 02/07/2020 showed single polypoid focus of susceptibility artifact and faint nodular enhancement within the central pons.  Findings favored to reflect a small vascular structure short interval follow-up in 51-month suggested to document stability.  No other MRI evidence of intracranial metastatic disease. Cytology from bronchoscopy showed malignant cells consistent with small cell carcinoma in the 4R lymph node.  He is here to discuss initial treatment.  He is doing quite well since discharge, no new complaints.  No more headaches. No hemoptysis.  Rest of the pertinent 10 point ROS reviewed and negative.  REVIEW OF SYSTEMS:    Constitutional: Denies fevers, chills or abnormal night sweats Eyes: Denies blurriness of vision, double vision or watery eyes Ears, nose, mouth, throat, and face: Denies mucositis or sore throat Respiratory: Denies cough, dyspnea or wheezes Cardiovascular: Denies palpitation, chest discomfort or lower extremity swelling Gastrointestinal:  Denies nausea, heartburn or change in bowel habits Skin: Denies abnormal skin rashes Lymphatics: Denies new lymphadenopathy or easy bruising Neurological:Denies numbness, tingling or new weaknesses Behavioral/Psych: Mood is stable, no new changes  All other systems were reviewed with the patient and are negative.  MEDICAL HISTORY:  Past Medical History:  Diagnosis Date  . Hypertension   . Lung cancer ([place])    small cell RUL    SURGICAL HISTORY: Past Surgical History:  Procedure Laterality Date  . BRONCHIAL BRUSHINGS  02/08/2020   Procedure: BRONCHIAL BRUSHINGS;  Surgeon: Freddi Starr, MD;  Location: [facility];  Service: Pulmonary;;  . BRONCHIAL NEEDLE ASPIRATION BIOPSY  02/08/2020   Procedure: BRONCHIAL NEEDLE ASPIRATION BIOPSIES;  Surgeon: Freddi Starr, MD;  Location[facility] END[facility]COPY;  Service: Pulmonary;;  . KNEE SURGERY Right   . SHOULDER SURGERY Left   . VIDEO BRONCHOSCOPY WITH ENDOBRONCHIAL ULTRASOUND N/A 02/08/2020   Procedure: VIDEO BRONCHOSCOPY WITH ENDOBRONCHIAL ULTRASOUND;  Surgeon: Freddi Starr, MD;  Location: [facility];  Service: Pulmonary;  Laterality: N/A;    SOCIAL HISTORY: Social History   Socioeconomic History  . Marital status: Significant Other    Spouse name: [name]  . Number of children: Not on file  . Years of education: Not on file  . Highest education level: Not on file  Occupational History  . Not on file  Tobacco Use  . Smoking status: Former Smoker    Packs/day: 1.00    Years: 20.00    Pack years: 20.00    Types: Cigarettes    Quit date: 06/09/2018    Years since quitting: 1.6  . Smokeless tobacco: Never Used  Vaping Use  . Vaping Use: Never used  Substance and Sexual Activity  . Alcohol use: Not Currently  . Drug use: Never  . Sexual activity: Yes  Other Topics Concern  . Not on file  Social History Narrative  . Not on file   Social Determinants of Health   Financial Resource Strain: Not on file  Food Insecurity: Not on file  Transportation Needs: Not on file  Physical Activity: Not on file  Stress: Not on file  Social Connections: Not on file  Intimate Partner Violence: Not on file    FAMILY HISTORY: Family History  Problem Relation Age of Onset  .  Heart disease Mother   . Cancer Cousin   . Breast cancer Neg Hx   . Colon cancer Neg Hx   . Pancreatic cancer Neg Hx   . Prostate cancer Neg Hx     ALLERGIES:  has No Known Allergies.  MEDICATIONS:  Current Outpatient Medications  Medication Sig Dispense Refill  . amLODipine (NORVASC) 10 MG tablet Take 10 mg by mouth daily.    Marland Kitchen lisinopril-hydrochlorothiazide (ZESTORETIC) 20-12.5 MG tablet Take 1 tablet by mouth daily.    Marland Kitchen dexamethasone (DECADRON) 4 MG tablet Take 2 tablets (8 mg total) by mouth daily. Take for  1 day starting the dayafterchemotherapyon day 4. 30 tablet 1  . ibuprofen (ADVIL) 600 MG tablet Take 600 mg by mouth every 6 (six) hours as needed. (Patient not taking: No sig reported)    . lidocaine-prilocaine (EMLA) cream Apply to affected area once (Patient not taking: No sig reported) 30 g 3  . LORazepam (ATIVAN) 0.5 MG tablet Take 1 tablet (0.5 mg total) by mouth every 6 (six) hours as needed (Nausea or vomiting). (Patient not taking: No sig reported) 30 tablet 0  . ondansetron (ZOFRAN) 8 MG tablet Take 1 tablet (8 mg total) by mouth 2 (two) times daily as needed. Start on the third day after cisplatin chemotherapy. (Patient not taking: No sig reported) 30 tablet 1  . prochlorperazine (COMPAZINE) 10 MG tablet Take 1 tablet (10 mg total) by mouth every 6 (six) hours as needed (Nausea or vomiting). (Patient not taking: No sig reported) 30 tablet 1   No current facility-administered medications for this visit.    PHYSICAL EXAMINATION:  ECOG PERFORMANCE STATUS: 0 - Asymptomatic  Vitals:   02/11/20 1316  BP: (!) 147/94  Pulse: 83  Resp: 18  Temp: 97.8 F (36.6 C)  SpO2: 100%   Filed Weights   02/11/20 1316  Weight: 178 lb 12.8 oz (81.1 kg)    GENERAL:alert, no distress and comfortable SKIN: skin color, texture, turgor are normal, no rashes or significant lesions EYES: normal, conjunctiva are pink and non-injected, sclera clear OROPHARYNX:no exudate, no erythema and lips, buccal mucosa, and tongue normal  NECK: supple, thyroid normal size, non-tender, without nodularity LYMPH:  no palpable lymphadenopathy in the cervical, axillary or inguinal LUNGS: clear to auscultation and percussion with normal breathing effort HEART: regular rate & rhythm and no murmurs and no lower extremity edema ABDOMEN:abdomen soft, non-tender and normal bowel sounds Musculoskeletal:no cyanosis of digits and no clubbing  PSYCH: alert & oriented x 3 with fluent speech NEURO: no focal motor/sensory  deficits  LABORATORY DATA:  I have reviewed the data as listed Lab Results  Component Value Date   WBC 8.1 02/11/2020   HGB 12.3 (L) 02/11/2020   HCT 38.1 (L) 02/11/2020   MCV 82.1 02/11/2020   PLT 309 02/11/2020     Chemistry      Component Value Date/Time   NA 141 02/11/2020 1231   K 4.4 02/11/2020 1231   CL 106 02/11/2020 1231   CO2 31 02/11/2020 1231   BUN 13 02/11/2020 1231   CREATININE 1.19 02/11/2020 1231      Component Value Date/Time   CALCIUM 9.8 02/11/2020 1231   ALKPHOS 69 02/11/2020 1231   AST 18 02/11/2020 1231   ALT 28 02/11/2020 1231   BILITOT 0.3 02/11/2020 1231       RADIOGRAPHIC STUDIES: I have personally reviewed the radiological images as listed and agreed with the findings in the report. DG Chest 2 View  Result Date: 02/06/2020 CLINICAL DATA:  Shortness of breath with exertion. Hemoptysis last night. Ex-smoker. EXAM: CHEST - 2 VIEW COMPARISON:  Earlier today. FINDINGS: Again demonstrated is a large suprahilar/mediastinal mass with enlargement of the adjacent superior right hilum. A small amount of patchy and linear density in the right upper lobe is unchanged. Clear left lung. Normal sized heart. Tortuous and partially calcified thoracic aorta. Thoracic spine degenerative changes. IMPRESSION: 1. Stable large right suprahilar/mediastinal mass with enlargement of the adjacent superior right hilum. This remains concerning for malignancy. A follow-up chest CT with contrast is recommended. 2. Stable mild right upper lobe scarring/bullous changes and possible pneumonia or postobstructive changes. Electronically Signed   By: Claudie Revering M.D.   On: 02/06/2020 16:16   DG Chest 2 View  Result Date: 02/06/2020 CLINICAL DATA:  65 year old male with a history of cough and bloody sputum EXAM: CHEST - 2 VIEW COMPARISON:  None available FINDINGS: Cardiac diameter within normal limits. Lobular density in the right suprahilar region, with no comparison. This corresponds  to opacity in the hilar and suprahilar region on the lateral view. Estimated diameter 8.4 cm. Patchy opacity peripheral to the hilar density in the right upper lobe with thickening of the minor fissure. No pleural effusion. No pneumothorax. IMPRESSION: Right suprahilar/mediastinal mass, concerning for malignancy, or less likely vascular pathology. Contrast-enhanced chest CT is recommended Electronically Signed   By: Corrie Mckusick D.O.   On: 02/06/2020 14:57   CT Chest W Contrast  Result Date: 02/06/2020 CLINICAL DATA:  Hemoptysis with right-sided suprahilar/mediastinal mass EXAM: CT CHEST WITH CONTRAST TECHNIQUE: Multidetector CT imaging of the chest was performed during intravenous contrast administration. CONTRAST:  29mL OMNIPAQUE IOHEXOL 300 MG/ML  SOLN COMPARISON:  Chest x-ray from earlier in the same day. FINDINGS: Cardiovascular: Thoracic aorta shows a normal branching pattern. Scattered atherosclerotic calcifications are noted. Coronary calcifications are seen. The heart is not significantly enlarged. Pulmonary artery as visualized is within normal limits. Mediastinum/Nodes: Thoracic inlet is unremarkable. There is a large centrally necrotic mass lesion arising in the right suprahilar region and extending into the mediastinum adjacent to the trachea consistent with lymphadenopathy. This measures approximately 8.9 x 7.4 cm in greatest transverse and AP dimensions. This extends for at least 7.8 cm in craniocaudad projection. Extension into the right hilum is seen. Additionally in the right upper lobe there is a associated mass lesion which measures 2.9 x 2.6 cm. No sizable left hilar adenopathy is noted. The esophagus as visualized is within normal limits. Lungs/Pleura: Lungs are well aerated bilaterally and demonstrate some mild emphysematous changes. The left lung is clear. The right lung as previously described demonstrates a spiculated 2.9 x 2.6 cm mass lesion in the upper lobe consistent with a primary  pulmonary neoplasm till proven otherwise. This radiates to the right suprahilar adenopathy mass. No sizable effusion is seen. No other parenchymal nodule is noted. Upper Abdomen: Visualized upper abdomen demonstrates a 9 mm non-obstructing left renal stone. Adrenal glands are within normal limits. Vague hypodensity is noted within the liver consistent with a small cyst. No definitive metastatic lesions are seen. Musculoskeletal: Degenerative changes of the thoracic spine are seen. IMPRESSION: Right upper lobe mass lesion with associated large nodal mass involving the right suprahilar region and mediastinum in the paratracheal region consistent with primary pulmonary neoplasm and metastatic nodal involvement. Further workup to include tissue sampling is recommended. Nonobstructing left renal stone. Aortic Atherosclerosis (ICD10-I70.0) and Emphysema (ICD10-J43.9). Electronically Signed   By: Linus Mako.D.  On: 02/06/2020 20:58   MR Brain W and Wo Contrast  Result Date: 02/07/2020 CLINICAL DATA:  Initial evaluation for metastatic disease. EXAM: MRI HEAD WITHOUT AND WITH CONTRAST TECHNIQUE: Multiplanar, multiecho pulse sequences of the brain and surrounding structures were obtained without and with intravenous contrast. CONTRAST:  58mL GADAVIST GADOBUTROL 1 MMOL/ML IV SOLN COMPARISON:  None. FINDINGS: Brain: Mild age-related cerebral atrophy. Patchy and confluent T2/FLAIR hyperintensity within the periventricular and deep white matter both cerebral hemispheres, most consistent with chronic small vessel ischemic disease, mild in nature. Superimposed remote lacunar infarct present at the right basal ganglia. Associated chronic hemosiderin staining. No abnormal foci of restricted diffusion to suggest acute or subacute ischemia. Gray-white matter differentiation maintained. No encephalomalacia to suggest chronic cortical infarction elsewhere within the brain. No acute intracranial hemorrhage. Single punctate focus  of susceptibility artifact noted within the central pons (series 14, image 20). Additional faint nodular enhancement seen at this level (series 18, image 20). Finding favored to reflect a small vascular structure. No other foci of susceptibility artifact to suggest chronic intracranial hemorrhage. No mass lesion, midline shift, or mass effect. No hydrocephalus or extra-axial fluid collection. Pituitary gland suprasellar region within normal limits. Midline structures intact and normal. No other abnormal enhancement or evidence for intracranial metastatic disease. Vascular: Major intracranial vascular flow voids are maintained. Skull and upper cervical spine: Craniocervical junction within normal limits. Bone marrow signal intensity normal. No focal marrow replacing lesion. Scalp soft tissues within normal limits. Sinuses/Orbits: Globes and orbital soft tissues within normal limits. Paranasal sinuses are largely clear. No mastoid effusion. Inner ear structures grossly normal. Other: None. IMPRESSION: 1. Single punctate focus of susceptibility artifact and faint nodular enhancement within the central pons as above. Finding favored to reflect a small vascular structure. Short interval follow-up MRI in 3 months suggested to document stability. 2. No other MRI evidence for intracranial metastatic disease. 3. 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 Electronically Signed   By: Jeannine Boga M.D.   On: 02/07/2020 02:55   CT ABDOMEN PELVIS W CONTRAST  Result Date: 02/07/2020 CLINICAL DATA:  Cancer of unknown primary question pulmonary malignancy by prior CT chest EXAM: CT ABDOMEN AND PELVIS WITH CONTRAST TECHNIQUE: Multidetector CT imaging of the abdomen and pelvis was performed using the standard protocol following bolus administration of intravenous contrast. Sagittal and coronal MPR images reconstructed from  axial data set. CONTRAST:  179mL OMNIPAQUE IOHEXOL 300 MG/ML SOLN IV. No oral contrast. COMPARISON:  CT chest 02/06/2020 FINDINGS: Lower chest: Lung bases clear Hepatobiliary: Tiny cyst LEFT lobe liver 9 x 6 mm image 13. Gallbladder and liver otherwise normal appearance Pancreas: Normal appearance Spleen: Normal appearance. Two tiny splenule is noted inferior to spleen. Adrenals/Urinary Tract: 8 mm nonobstructing calculus inferior LEFT kidney image 30. Adrenal glands, kidneys, and ureters otherwise normal appearance. Bladder wall appears prominent but bladder is decompressed likely artifactual and contains excreted contrast from prior CT. Stomach/Bowel: Normal appendix. Stomach and bowel loops normal appearance Vascular/Lymphatic: Atherosclerotic calcifications aorta and iliac arteries. Aorta normal caliber. No adenopathy. Reproductive: Unremarkable Other: No free air or free fluid. Small umbilical hernia containing fat. Musculoskeletal: Bubbly mixed lytic and sclerotic lesion within the RIGHT femoral head of uncertain acuity, could represent a small enchondroma, metastasis considered less likely but not entirely excluded. Degenerative disc disease changes of thoracolumbar spine. No additional focal bone lesions. IMPRESSION: 8 mm nonobstructing calculus inferior LEFT kidney. Small umbilical hernia containing  fat. Bubbly mixed lytic and sclerotic lesion within the RIGHT femoral head of uncertain acuity, could represent a small enchondroma, metastasis considered less likely but not entirely excluded. No other intra-abdominal or intrapelvic abnormalities. Aortic Atherosclerosis (ICD10-I70.0). Electronically Signed   By: Lavonia Dana M.D.   On: 02/07/2020 10:21   DG CHEST PORT 1 VIEW  Result Date: 02/08/2020 CLINICAL DATA:  Status post bronchoscopy and biopsy EXAM: PORTABLE CHEST 1 VIEW COMPARISON:  02/06/2020 FINDINGS: Stable heart size. Large right suprahilar/mediastinal mass and irregular right apical nodule/mass  are grossly unchanged. Left lung is clear. No pneumothorax. IMPRESSION: 1. No pneumothorax status post bronchoscopy. 2. Large right suprahilar/mediastinal mass and irregular right apical nodule/mass. Electronically Signed   By: Davina Poke D.O.   On: 02/08/2020 14:43    All questions were answered. The patient knows to call the clinic with any problems, questions or concerns. I spent 45 minutes in the care of this patient including H and P, review of records, counseling and coordination of care.     Benay Pike, MD 02/11/2020 4:43 PM

## 2020-02-11 NOTE — Telephone Encounter (Signed)
Thank you for forwarding the results. He has an appointment setup today 2/3 with Oncology.   Adam [name]

## 2020-02-11 NOTE — Progress Notes (Addendum)
Thoracic Location of Tumor / Histology: small cell lung cancer RUL lung lesion  Patient presented with a 3 month history of hemoptysis. Patient delayed evaluation because hemoptysis began shortly after tooth extractions and he related it to that.  Biopsies of RUL lung lesion (if applicable) revealed:  FINAL MICROSCOPIC DIAGNOSIS:   A. LYMPH NODE, 4R MASS, FINE NEEDLE ASPIRATION:  - Malignant cells consistent with small cell carcinoma, see comment   B. LUNG, RUL, BRUSHING:  - No malignant cells identified   Tobacco/Marijuana/Snuff/ETOH use: yes, former smoker quit 2 years ago 1 ppd for 20 years  Past/Anticipated interventions by cardiothoracic surgery, if any: no  Past/Anticipated interventions by medical oncology, if any: Cycle of etoposide and cisplatin  Signs/Symptoms  Weight changes, if any: denies  Respiratory complaints, if any: Denies a persistent cough, difficulty swallowing or shortness of breath with or without exertion.  Hemoptysis, if any: Denies any hemoptysis in 1 week.  Pain issues, if any:  Denies any chest pain. Denies any new pain.  Patient denies any neurologic symptoms such as headache, dizziness, nausea, vomiting, diplopia or tinnitus.  SAFETY ISSUES:  Prior radiation? denies  Pacemaker/ICD? denies   Possible current pregnancy?no, male patient  Is the patient on methotrexate? denies  Current Complaints / other details:  65 year old male. Significant other, [name]

## 2020-02-12 ENCOUNTER — Telehealth: Payer: Self-pay | Admitting: Hematology and Oncology

## 2020-02-12 NOTE — Telephone Encounter (Signed)
Scheduled appointment per 2/3 los. Spoke to patient who is aware of appointment date and time.

## 2020-02-15 ENCOUNTER — Telehealth: Payer: Self-pay

## 2020-02-15 ENCOUNTER — Inpatient Hospital Stay: Payer: 59

## 2020-02-15 NOTE — Progress Notes (Signed)
OK to proceed with treatment with pending prior authorization.  Based on diagnosis this is NCCN approved treatment for patient.  Reviewed with prior authorization team and RN.  Henreitta Leber, Jacqulyn Bath, PharmD 02/15/2020 @ [phone]

## 2020-02-15 NOTE — Telephone Encounter (Signed)
Spoke with Museum/gallery conservator at [facility] Pathology department: she states patient has adequate tissue for enrollment available.  [name]. Sharlett Iles, BSN, RN, CIC 02/15/2020 4:49 PM

## 2020-02-16 ENCOUNTER — Inpatient Hospital Stay: Payer: 59

## 2020-02-16 ENCOUNTER — Other Ambulatory Visit: Payer: Self-pay

## 2020-02-16 VITALS — BP 158/94 | HR 74 | Temp 98.5°F | Resp 18 | Ht 69.0 in | Wt 180.5 lb

## 2020-02-16 DIAGNOSIS — C3490 Malignant neoplasm of unspecified part of unspecified bronchus or lung: Secondary | ICD-10-CM

## 2020-02-16 DIAGNOSIS — Z5111 Encounter for antineoplastic chemotherapy: Secondary | ICD-10-CM | POA: Diagnosis not present

## 2020-02-16 LAB — CBC WITH DIFFERENTIAL/PLATELET
Abs Immature Granulocytes: 0.01 10*3/uL (ref 0.00–0.07)
Basophils Absolute: 0.1 10*3/uL (ref 0.0–0.1)
Basophils Relative: 1 %
Eosinophils Absolute: 0.4 10*3/uL (ref 0.0–0.5)
Eosinophils Relative: 6 %
HCT: 38.9 % — ABNORMAL LOW (ref 39.0–52.0)
Hemoglobin: 12.5 g/dL — ABNORMAL LOW (ref 13.0–17.0)
Immature Granulocytes: 0 %
Lymphocytes Relative: 37 %
Lymphs Abs: 2.9 10*3/uL (ref 0.7–4.0)
MCH: 26.2 pg (ref 26.0–34.0)
MCHC: 32.1 g/dL (ref 30.0–36.0)
MCV: 81.4 fL (ref 80.0–100.0)
Monocytes Absolute: 0.7 10*3/uL (ref 0.1–1.0)
Monocytes Relative: 9 %
Neutro Abs: 3.7 10*3/uL (ref 1.7–7.7)
Neutrophils Relative %: 47 %
Platelets: 283 10*3/uL (ref 150–400)
RBC: 4.78 MIL/uL (ref 4.22–5.81)
RDW: 14.3 % (ref 11.5–15.5)
WBC: 7.8 10*3/uL (ref 4.0–10.5)
nRBC: 0 % (ref 0.0–0.2)

## 2020-02-16 LAB — COMPREHENSIVE METABOLIC PANEL
ALT: 20 U/L (ref 0–44)
AST: 17 U/L (ref 15–41)
Albumin: 3.3 g/dL — ABNORMAL LOW (ref 3.5–5.0)
Alkaline Phosphatase: 68 U/L (ref 38–126)
Anion gap: 7 (ref 5–15)
BUN: 10 mg/dL (ref 8–23)
CO2: 23 mmol/L (ref 22–32)
Calcium: 9.4 mg/dL (ref 8.9–10.3)
Chloride: 108 mmol/L (ref 98–111)
Creatinine, Ser: 0.99 mg/dL (ref 0.61–1.24)
GFR, Estimated: >60 mL/min (ref >60)
Glucose, Bld: 123 mg/dL — ABNORMAL HIGH (ref 70–99)
Potassium: 3.6 mmol/L (ref 3.5–5.1)
Sodium: 138 mmol/L (ref 135–145)
Total Bilirubin: 0.4 mg/dL (ref 0.3–1.2)
Total Protein: 7.5 g/dL (ref 6.5–8.1)

## 2020-02-16 LAB — MAGNESIUM: Magnesium: 2 mg/dL (ref 1.7–2.4)

## 2020-02-16 MED ORDER — SODIUM CHLORIDE 0.9 % IV SOLN
10.0000 mg | Freq: Once | INTRAVENOUS | Status: AC
  Administered 2020-02-16: 10 mg via INTRAVENOUS
  Filled 2020-02-16: qty 10

## 2020-02-16 MED ORDER — SODIUM CHLORIDE 0.9 % IV SOLN
100.0000 mg/m2 | Freq: Once | INTRAVENOUS | Status: AC
  Administered 2020-02-16: 200 mg via INTRAVENOUS
  Filled 2020-02-16: qty 10

## 2020-02-16 MED ORDER — SODIUM CHLORIDE 0.9 % IV SOLN
150.0000 mg | Freq: Once | INTRAVENOUS | Status: AC
  Administered 2020-02-16: 150 mg via INTRAVENOUS
  Filled 2020-02-16: qty 150

## 2020-02-16 MED ORDER — PALONOSETRON HCL INJECTION 0.25 MG/5ML
0.2500 mg | Freq: Once | INTRAVENOUS | Status: AC
  Administered 2020-02-16: 0.25 mg via INTRAVENOUS

## 2020-02-16 MED ORDER — SODIUM CHLORIDE 0.9 % IV SOLN
Freq: Once | INTRAVENOUS | Status: AC
  Filled 2020-02-16: qty 250

## 2020-02-16 MED ORDER — MAGNESIUM SULFATE 2 GM/50ML IV SOLN
2.0000 g | Freq: Once | INTRAVENOUS | Status: AC
  Administered 2020-02-16: 2 g via INTRAVENOUS

## 2020-02-16 MED ORDER — POTASSIUM CHLORIDE IN NACL 20-0.9 MEQ/L-% IV SOLN
Freq: Once | INTRAVENOUS | Status: AC
  Filled 2020-02-16: qty 1000

## 2020-02-16 MED ORDER — SODIUM CHLORIDE 0.9 % IV SOLN
80.0000 mg/m2 | Freq: Once | INTRAVENOUS | Status: AC
  Administered 2020-02-16: 162 mg via INTRAVENOUS
  Filled 2020-02-16: qty 162

## 2020-02-16 MED ORDER — MAGNESIUM SULFATE 2 GM/50ML IV SOLN
INTRAVENOUS | Status: AC
  Filled 2020-02-16: qty 50

## 2020-02-16 NOTE — Patient Instructions (Signed)
[facility] Discharge Instructions for Patients Receiving Chemotherapy  Today you received the following chemotherapy agents: Cisplatin and Etoposide   To help prevent nausea and vomiting after your treatment, we encourage you to take your nausea medication  as prescribed.    If you develop nausea and vomiting that is not controlled by your nausea medication, call the clinic.   BELOW ARE SYMPTOMS THAT SHOULD BE REPORTED IMMEDIATELY:  *FEVER GREATER THAN 100.5 F  *CHILLS WITH OR WITHOUT FEVER  NAUSEA AND VOMITING THAT IS NOT CONTROLLED WITH YOUR NAUSEA MEDICATION  *UNUSUAL SHORTNESS OF BREATH  *UNUSUAL BRUISING OR BLEEDING  TENDERNESS IN MOUTH AND THROAT WITH OR WITHOUT PRESENCE OF ULCERS  *URINARY PROBLEMS  *BOWEL PROBLEMS  UNUSUAL RASH Items with * indicate a potential emergency and should be followed up as soon as possible.  Feel free to call the clinic should you have any questions or concerns. The clinic phone number is [phone].  Please show the [facility] at check-in to the Emergency Department and triage nurse.  Cisplatin injection What is this medicine? CISPLATIN (SIS pla tin) is a chemotherapy drug. It targets fast dividing cells, like cancer cells, and causes these cells to die. This medicine is used to treat many types of cancer like bladder, ovarian, and testicular cancers. This medicine may be used for other purposes; ask your health care provider or pharmacist if you have questions. COMMON BRAND NAME(S): Platinol, Platinol -AQ What should I tell my health care provider before I take this medicine? They need to know if you have any of these conditions:  eye disease, vision problems  hearing problems  kidney disease  low blood counts, like white cells, platelets, or red blood cells  tingling of the fingers or toes, or other nerve disorder  an unusual or allergic reaction to cisplatin, carboplatin, oxaliplatin, other medicines,  foods, dyes, or preservatives  pregnant or trying to get pregnant  breast-feeding How should I use this medicine? This drug is given as an infusion into a vein. It is administered in a hospital or clinic by a specially trained health care professional. Talk to your pediatrician regarding the use of this medicine in children. Special care may be needed. Overdosage: If you think you have taken too much of this medicine contact a poison control center or emergency room at once. NOTE: This medicine is only for you. Do not share this medicine with others. What if I miss a dose? It is important not to miss a dose. Call your doctor or health care professional if you are unable to keep an appointment. What may interact with this medicine? This medicine may interact with the following medications:  foscarnet  certain antibiotics like amikacin, gentamicin, neomycin, polymyxin B, streptomycin, tobramycin, vancomycin This list may not describe all possible interactions. Give your health care provider a list of all the medicines, herbs, non-prescription drugs, or dietary supplements you use. Also tell them if you smoke, drink alcohol, or use illegal drugs. Some items may interact with your medicine. What should I watch for while using this medicine? Your condition will be monitored carefully while you are receiving this medicine. You will need important blood work done while you are taking this medicine. This drug may make you feel generally unwell. This is not uncommon, as chemotherapy can affect healthy cells as well as cancer cells. Report any side effects. Continue your course of treatment even though you feel ill unless your doctor tells you to stop.  This medicine may increase your risk of getting an infection. Call your healthcare professional for advice if you get a fever, chills, or sore throat, or other symptoms of a cold or flu. Do not treat yourself. Try to avoid being around people who are  sick. Avoid taking medicines that contain aspirin, acetaminophen, ibuprofen, naproxen, or ketoprofen unless instructed by your healthcare professional. These medicines may hide a fever. This medicine may increase your risk to bruise or bleed. Call your doctor or health care professional if you notice any unusual bleeding. Be careful brushing and flossing your teeth or using a toothpick because you may get an infection or bleed more easily. If you have any dental work done, tell your dentist you are receiving this medicine. Do not become pregnant while taking this medicine or for 14 months after stopping it. Women should inform their healthcare professional if they wish to become pregnant or think they might be pregnant. Men should not father a child while taking this medicine and for 11 months after stopping it. There is potential for serious side effects to an unborn child. Talk to your healthcare professional for more information. Do not breast-feed an infant while taking this medicine. This medicine has caused ovarian failure in some women. This medicine may make it more difficult to get pregnant. Talk to your healthcare professional if you are concerned about your fertility. This medicine has caused decreased sperm counts in some men. This may make it more difficult to father a child. Talk to your healthcare professional if you are concerned about your fertility. Drink fluids as directed while you are taking this medicine. This will help protect your kidneys. Call your doctor or health care professional if you get diarrhea. Do not treat yourself. What side effects may I notice from receiving this medicine? Side effects that you should report to your doctor or health care professional as soon as possible:  allergic reactions like skin rash, itching or hives, swelling of the face, lips, or tongue  blurred vision  changes in vision  decreased hearing or ringing of the ears  nausea,  vomiting  pain, redness, or irritation at site where injected  pain, tingling, numbness in the hands or feet  signs and symptoms of bleeding such as bloody or black, tarry stools; red or dark brown urine; spitting up blood or brown material that looks like coffee grounds; red spots on the skin; unusual bruising or bleeding from the eyes, gums, or nose  signs and symptoms of infection like fever; chills; cough; sore throat; pain or trouble passing urine  signs and symptoms of kidney injury like trouble passing urine or change in the amount of urine  signs and symptoms of low red blood cells or anemia such as unusually weak or tired; feeling faint or lightheaded; falls; breathing problems Side effects that usually do not require medical attention (report to your doctor or health care professional if they continue or are bothersome):  loss of appetite  mouth sores  muscle cramps This list may not describe all possible side effects. Call your doctor for medical advice about side effects. You may report side effects to FDA at 1-800-FDA-1088. Where should I keep my medicine? This drug is given in a hospital or clinic and will not be stored at home. NOTE: This sheet is a summary. It may not cover all possible information. If you have questions about this medicine, talk to your doctor, pharmacist, or health care provider.  2021 Elsevier/Gold Standard (2017-12-20  15:59:17)  Etoposide, VP-16 injection What is this medicine? ETOPOSIDE, VP-16 (e toe POE side) is a chemotherapy drug. It is used to treat testicular cancer, lung cancer, and other cancers. This medicine may be used for other purposes; ask your health care provider or pharmacist if you have questions. COMMON BRAND NAME(S): Etopophos, Toposar, VePesid What should I tell my health care provider before I take this medicine? They need to know if you have any of these conditions:  infection  kidney disease  liver disease  low  blood counts, like low white cell, platelet, or red cell counts  an unusual or allergic reaction to etoposide, other medicines, foods, dyes, or preservatives  pregnant or trying to get pregnant  breast-feeding How should I use this medicine? This medicine is for infusion into a vein. It is administered in a hospital or clinic by a specially trained health care professional. Talk to your pediatrician regarding the use of this medicine in children. Special care may be needed. Overdosage: If you think you have taken too much of this medicine contact a poison control center or emergency room at once. NOTE: This medicine is only for you. Do not share this medicine with others. What if I miss a dose? It is important not to miss your dose. Call your doctor or health care professional if you are unable to keep an appointment. What may interact with this medicine? This medicine may interact with the following medications:  warfarin This list may not describe all possible interactions. Give your health care provider a list of all the medicines, herbs, non-prescription drugs, or dietary supplements you use. Also tell them if you smoke, drink alcohol, or use illegal drugs. Some items may interact with your medicine. What should I watch for while using this medicine? Visit your doctor for checks on your progress. This drug may make you feel generally unwell. This is not uncommon, as chemotherapy can affect healthy cells as well as cancer cells. Report any side effects. Continue your course of treatment even though you feel ill unless your doctor tells you to stop. In some cases, you may be given additional medicines to help with side effects. Follow all directions for their use. Call your doctor or health care professional for advice if you get a fever, chills or sore throat, or other symptoms of a cold or flu. Do not treat yourself. This drug decreases your body's ability to fight infections. Try to avoid  being around people who are sick. This medicine may increase your risk to bruise or bleed. Call your doctor or health care professional if you notice any unusual bleeding. Talk to your doctor about your risk of cancer. You may be more at risk for certain types of cancers if you take this medicine. Do not become pregnant while taking this medicine or for at least 6 months after stopping it. Women should inform their doctor if they wish to become pregnant or think they might be pregnant. Women of child-bearing potential will need to have a negative pregnancy test before starting this medicine. There is a potential for serious side effects to an unborn child. Talk to your health care professional or pharmacist for more information. Do not breast-feed an infant while taking this medicine. Men must use a latex condom during sexual contact with a woman while taking this medicine and for at least 4 months after stopping it. A latex condom is needed even if you have had a vasectomy. Contact your doctor right away  if your partner becomes pregnant. Do not donate sperm while taking this medicine and for at least 4 months after you stop taking this medicine. Men should inform their doctors if they wish to father a child. This medicine may lower sperm counts. What side effects may I notice from receiving this medicine? Side effects that you should report to your doctor or health care professional as soon as possible:  allergic reactions like skin rash, itching or hives, swelling of the face, lips, or tongue  low blood counts - this medicine may decrease the number of white blood cells, red blood cells, and platelets. You may be at increased risk for infections and bleeding  nausea, vomiting  redness, blistering, peeling or loosening of the skin, including inside the mouth  signs and symptoms of infection like fever; chills; cough; sore throat; pain or trouble passing urine  signs and symptoms of low red blood  cells or anemia such as unusually weak or tired; feeling faint or lightheaded; falls; breathing problems  unusual bruising or bleeding Side effects that usually do not require medical attention (report to your doctor or health care professional if they continue or are bothersome):  changes in taste  diarrhea  hair loss  loss of appetite  mouth sores This list may not describe all possible side effects. Call your doctor for medical advice about side effects. You may report side effects to FDA at 1-800-FDA-1088. Where should I keep my medicine? This drug is given in a hospital or clinic and will not be stored at home. NOTE: This sheet is a summary. It may not cover all possible information. If you have questions about this medicine, talk to your doctor, pharmacist, or health care provider.  2021 Elsevier/Gold Standard (2018-02-19 16:57:15)

## 2020-02-16 NOTE — Research (Addendum)
Trial:  [id number]-LU005 TESTING THE ADDITION OF ATEZOLIZUMAB TO THE USUAL CHEMORADIATION TREATMENT FOR LIMITED STAGE SMALL CELL LUNG CANCER  02/16/2020   Spoke with patient Adam [name] while patient is at the center to receive cycle one of treatment. Patient was advised that pathology confirms the presence of an available tissue block; however, they are verifying whether the block is lymph node tissue or primary tumor tissue which will be a deciding factor for eligibility: Adam [name] verbalizes understanding. Adam [name] has reviewed the provided consent(s) from our initial visit and has questions: all questions were answered to his satisfaction. Adam [name] verbalizes continued interest in participating and understands primary tumor tissue is necessary for enrollment.   Current plan is to follow-up with pathology today (already called, pending call back) and if primary block is available, follow-up with Adam [name] during his next provided visit (scheduled for Tuesday, 02/23/20) to sign consent. Adam [name] agrees with this plan and verbalizes appreciation for the information. Adam [name] is thanked for his time and interest in participating in the NRG-LU--5 study. Will update Drs. Iruku and Manning once I receive confirmation from pathology.  [name]. Sharlett Iles, BSN, RN, CIC 02/16/2020 11:00 AM    02/16/2020     11:45AM  2ND ELIGIBILITY: This Coordinator has reviewed this patient's inclusion and exclusion criteria as a second review and confirms Adam [name] is eligible for study participation.  Patient may continue with enrollment.  Carol Ada, RT(R)(T) Clinical Research Coordinator   02/16/2020 16:00PM  PATHOLOGY CALL: Outgoing call to pathology: state the original request was discarded once a block was confirmed, unsure if block is tumor tissue or lymph node. Request original pathology request be resent. Original request refaxed as requested (transmission verified). Will follow up tomorrow for results.  [name]. Sharlett Iles, BSN,  RN, CIC 02/16/2020 4:10 PM

## 2020-02-16 NOTE — Progress Notes (Unsigned)
Per Teldrin RPh - cisplatin and etoposide order are interchangeable. Okay to proceed with cisplatin then etoposide.

## 2020-02-17 ENCOUNTER — Encounter: Payer: Self-pay | Admitting: Hematology and Oncology

## 2020-02-17 ENCOUNTER — Inpatient Hospital Stay: Payer: 59

## 2020-02-17 ENCOUNTER — Other Ambulatory Visit: Payer: Self-pay | Admitting: Hematology and Oncology

## 2020-02-17 ENCOUNTER — Other Ambulatory Visit: Payer: Self-pay

## 2020-02-17 VITALS — BP 162/99 | HR 87 | Temp 98.2°F | Resp 18

## 2020-02-17 DIAGNOSIS — Z5111 Encounter for antineoplastic chemotherapy: Secondary | ICD-10-CM | POA: Diagnosis not present

## 2020-02-17 DIAGNOSIS — C3490 Malignant neoplasm of unspecified part of unspecified bronchus or lung: Secondary | ICD-10-CM

## 2020-02-17 MED ORDER — SODIUM CHLORIDE 0.9 % IV SOLN
Freq: Once | INTRAVENOUS | Status: AC
  Filled 2020-02-17: qty 250

## 2020-02-17 MED ORDER — SODIUM CHLORIDE 0.9 % IV SOLN
100.0000 mg/m2 | Freq: Once | INTRAVENOUS | Status: AC
  Administered 2020-02-17: 200 mg via INTRAVENOUS
  Filled 2020-02-17: qty 10

## 2020-02-17 MED ORDER — SODIUM CHLORIDE 0.9 % IV SOLN
10.0000 mg | Freq: Once | INTRAVENOUS | Status: AC
  Administered 2020-02-17: 10 mg via INTRAVENOUS
  Filled 2020-02-17: qty 10

## 2020-02-17 NOTE — Patient Instructions (Signed)
[name] Discharge Instructions for Patients Receiving Chemotherapy  Today you received the following chemotherapy agents:  Etoposide   To help prevent nausea and vomiting after your treatment, we encourage you to take your nausea medication  as prescribed.    If you develop nausea and vomiting that is not controlled by your nausea medication, call the clinic.   BELOW ARE SYMPTOMS THAT SHOULD BE REPORTED IMMEDIATELY:  *FEVER GREATER THAN 100.5 F  *CHILLS WITH OR WITHOUT FEVER  NAUSEA AND VOMITING THAT IS NOT CONTROLLED WITH YOUR NAUSEA MEDICATION  *UNUSUAL SHORTNESS OF BREATH  *UNUSUAL BRUISING OR BLEEDING  TENDERNESS IN MOUTH AND THROAT WITH OR WITHOUT PRESENCE OF ULCERS  *URINARY PROBLEMS  *BOWEL PROBLEMS  UNUSUAL RASH Items with * indicate a potential emergency and should be followed up as soon as possible.  Feel free to call the clinic should you have any questions or concerns. The clinic phone number is [phone].  Please show the [facility] at check-in to the Emergency Department and triage nurse.

## 2020-02-17 NOTE — Progress Notes (Signed)
Called patient and explained the push back from insurance about etoposide despite the diagnosis of small cell lung cancer. I also emailed the PI for the study about some questions regarding the pathology. Awaiting to hear back from them. Patient is willing to proceed with treatment, he understands that we will continue to try and discuss with insurance or will engage [employer] for some funding assistance. He is thankful for this.

## 2020-02-17 NOTE — Progress Notes (Signed)
I called the insurance number provided to me for Peer to Peer and I was told that there is no way for them to contact an MD and they need to schedule peer to peer. I expressed that this is not out of standard of care and small cell lung cancer histology needs to be treated in a timely manner and their doctor can call me anytime today and ASAP. He took the message and mentioned that he will do his best.

## 2020-02-17 NOTE — Research (Signed)
Trial:  NRG-LU005 TESTING THE ADDITION OF ATEZOLIZUMAB TO THE USUAL CHEMORADIATION TREATMENT FOR LIMITED STAGE SMALL CELL LUNG CANCER  02/17/2020 13:23PM  PATHOLOGY: Incoming call from Dr. Vicente Males in Pathology stating the available block was made from the fine needle aspirate and is not primary tumor tissue. Per [facility], section 3.1.3 of the protocol requires submission of a slide from the primary tumor. Information shared with Dr. Chryl Heck who is reaching out directly to the the principal investigator for clarification. I will update Adam [name] when he returns this afternoon. Current plan is to await a response from the principal investigator so enrollment can proceed (if applicable).   [name]. Sharlett Iles, BSN, RN, CIC 02/17/2020 2:03 PM  PATIENT UPDATED: Adam [name] arrives unaccompanied this afternoon for his second treatment and I have an opportunity to meet with him privately to advise him of where we currently stand with enrollment. He verbalizes understanding of eligibility criteria and verbalizes appreciation for the efforts of myself and Dr. Chryl Heck. I advise him that I will continue to keep him updated as information becomes available. All questions are answered to his satisfaction. Adam [name] is thanked for his time and continued interest in the [facility] study.  [name]. Sharlett Iles, BSN, RN, CIC 02/17/2020 4:10 PM

## 2020-02-17 NOTE — Progress Notes (Signed)
Called pt to introduce myself as his Arboriculturist.  Unfortunately there aren't any foundations offering copay assistance for the type of ins he has.  I offered the [employer] and went over what it covers.  Pt declined the grant at this time so I will request the registration staff give him my card in case he changes his mind and for any questions or concerns he may have in the future.

## 2020-02-18 ENCOUNTER — Other Ambulatory Visit: Payer: Self-pay | Admitting: *Deleted

## 2020-02-18 ENCOUNTER — Other Ambulatory Visit: Payer: Self-pay

## 2020-02-18 ENCOUNTER — Encounter: Payer: Self-pay | Admitting: *Deleted

## 2020-02-18 ENCOUNTER — Inpatient Hospital Stay: Payer: 59

## 2020-02-18 VITALS — BP 141/88 | HR 87 | Temp 98.0°F | Resp 18 | Wt 183.0 lb

## 2020-02-18 DIAGNOSIS — Z5111 Encounter for antineoplastic chemotherapy: Secondary | ICD-10-CM | POA: Diagnosis not present

## 2020-02-18 DIAGNOSIS — C3411 Malignant neoplasm of upper lobe, right bronchus or lung: Secondary | ICD-10-CM

## 2020-02-18 MED ORDER — DEXAMETHASONE SODIUM PHOSPHATE 100 MG/10ML IJ SOLN
10.0000 mg | Freq: Once | INTRAMUSCULAR | Status: AC
  Administered 2020-02-18: 10 mg via INTRAVENOUS
  Filled 2020-02-18: qty 10

## 2020-02-18 MED ORDER — SODIUM CHLORIDE 0.9 % IV SOLN
100.0000 mg/m2 | Freq: Once | INTRAVENOUS | Status: AC
  Administered 2020-02-18: 200 mg via INTRAVENOUS
  Filled 2020-02-18: qty 10

## 2020-02-18 MED ORDER — SODIUM CHLORIDE 0.9% FLUSH
10.0000 mL | INTRAVENOUS | Status: DC | PRN
  Filled 2020-02-18: qty 10

## 2020-02-18 MED ORDER — SODIUM CHLORIDE 0.9 % IV SOLN
Freq: Once | INTRAVENOUS | Status: AC
  Filled 2020-02-18: qty 250

## 2020-02-18 NOTE — Progress Notes (Signed)
I called insurance to update on change in ICD but was unable to reach.  I will call back.

## 2020-02-18 NOTE — Patient Instructions (Signed)
[facility] Discharge Instructions for Patients Receiving Chemotherapy  Today you received the following chemotherapy agents Etoposide  To help prevent nausea and vomiting after your treatment, we encourage you to take your nausea medication as directed.   If you develop nausea and vomiting that is not controlled by your nausea medication, call the clinic.   BELOW ARE SYMPTOMS THAT SHOULD BE REPORTED IMMEDIATELY:  *FEVER GREATER THAN 100.5 F  *CHILLS WITH OR WITHOUT FEVER  NAUSEA AND VOMITING THAT IS NOT CONTROLLED WITH YOUR NAUSEA MEDICATION  *UNUSUAL SHORTNESS OF BREATH  *UNUSUAL BRUISING OR BLEEDING  TENDERNESS IN MOUTH AND THROAT WITH OR WITHOUT PRESENCE OF ULCERS  *URINARY PROBLEMS  *BOWEL PROBLEMS  UNUSUAL RASH Items with * indicate a potential emergency and should be followed up as soon as possible.  Feel free to call the clinic should you have any questions or concerns. The clinic phone number is [phone].  Please show the [facility] at check-in to the Emergency Department and triage nurse.

## 2020-02-18 NOTE — Progress Notes (Signed)
Per Dr. Chryl Heck, updated problem list for ICD code.

## 2020-02-18 NOTE — Progress Notes (Signed)
The proposed treatment discussed in cancer conference 02/18/20 is for discussion purpose only and is not a binding recommendation.  The patient was physically examined nor present for their treatment options. Therefore, final treatment plan cannot be decided.

## 2020-02-19 ENCOUNTER — Other Ambulatory Visit: Payer: Self-pay

## 2020-02-19 ENCOUNTER — Other Ambulatory Visit: Payer: Self-pay | Admitting: Hematology and Oncology

## 2020-02-19 ENCOUNTER — Telehealth: Payer: Self-pay | Admitting: *Deleted

## 2020-02-19 ENCOUNTER — Telehealth: Payer: Self-pay

## 2020-02-19 DIAGNOSIS — C3411 Malignant neoplasm of upper lobe, right bronchus or lung: Secondary | ICD-10-CM

## 2020-02-19 NOTE — Telephone Encounter (Signed)
  Trial:  NRG-LU005 TESTING THE ADDITION OF ATEZOLIZUMAB TO THE USUAL CHEMORADIATION TREATMENT FOR LIMITED STAGE SMALL CELL LUNG CANCER  02/19/2020 1255PM  INCOMING CALL: Incoming call received from Dr. Chryl Heck stating she was able to speak directly with NRG-LU005 Study PI, Dr. Erasmo Downer A. Frutoso Schatz, who verbalizes that Adam [name] is eligible for enrollment with the currently available block (pathology). Dr. Chryl Heck asks that I notify the patient and proceed with consenting the patient during his next visit (scheduled for Tuesday, 02/23/20). Dr. Chryl Heck also asked Dr. Frutoso Schatz to respond in writing to verify eligibility: awaiting email from Dr. Frutoso Schatz.  OUTGOING CALL: Outgoing call to Adam [name]: I verified that I was speaking with the correct patient. Concepcion is notified of study eligibility and that study enrollment will proceed if he desires to voluntarily participate and he states, "That is good news!" Current plan is to see Adam [name] during his follow-up with Dr. Chryl Heck visit on 02/23/20 and Adam [name] agrees with this plan.  [name]. Sharlett Iles, BSN, RN, CIC 02/19/2020 1:15 PM

## 2020-02-23 ENCOUNTER — Inpatient Hospital Stay: Payer: 59

## 2020-02-23 ENCOUNTER — Other Ambulatory Visit: Payer: Self-pay | Admitting: Pharmacist

## 2020-02-23 ENCOUNTER — Other Ambulatory Visit: Payer: Self-pay | Admitting: Hematology and Oncology

## 2020-02-23 ENCOUNTER — Inpatient Hospital Stay (HOSPITAL_BASED_OUTPATIENT_CLINIC_OR_DEPARTMENT_OTHER): Payer: 59 | Admitting: Hematology and Oncology

## 2020-02-23 ENCOUNTER — Other Ambulatory Visit: Payer: Self-pay

## 2020-02-23 ENCOUNTER — Encounter: Payer: Self-pay | Admitting: Hematology and Oncology

## 2020-02-23 VITALS — BP 140/86 | HR 88 | Temp 97.7°F | Resp 18 | Ht 69.0 in | Wt 179.0 lb

## 2020-02-23 DIAGNOSIS — Z5111 Encounter for antineoplastic chemotherapy: Secondary | ICD-10-CM | POA: Diagnosis not present

## 2020-02-23 DIAGNOSIS — C3411 Malignant neoplasm of upper lobe, right bronchus or lung: Secondary | ICD-10-CM | POA: Diagnosis not present

## 2020-02-23 DIAGNOSIS — C3490 Malignant neoplasm of unspecified part of unspecified bronchus or lung: Secondary | ICD-10-CM

## 2020-02-23 LAB — COMPREHENSIVE METABOLIC PANEL
ALT: 23 U/L (ref 0–44)
AST: 14 U/L — ABNORMAL LOW (ref 15–41)
Albumin: 3.5 g/dL (ref 3.5–5.0)
Alkaline Phosphatase: 76 U/L (ref 38–126)
Anion gap: 11 (ref 5–15)
BUN: 12 mg/dL (ref 8–23)
CO2: 30 mmol/L (ref 22–32)
Calcium: 9.7 mg/dL (ref 8.9–10.3)
Chloride: 100 mmol/L (ref 98–111)
Creatinine, Ser: 1.11 mg/dL (ref 0.61–1.24)
GFR, Estimated: >60 mL/min (ref >60)
Glucose, Bld: 95 mg/dL (ref 70–99)
Potassium: 3.5 mmol/L (ref 3.5–5.1)
Sodium: 141 mmol/L (ref 135–145)
Total Bilirubin: 0.4 mg/dL (ref 0.3–1.2)
Total Protein: 7.7 g/dL (ref 6.5–8.1)

## 2020-02-23 LAB — CBC WITH DIFFERENTIAL/PLATELET
Abs Immature Granulocytes: 0.07 10*3/uL (ref 0.00–0.07)
Basophils Absolute: 0 10*3/uL (ref 0.0–0.1)
Basophils Relative: 1 %
Eosinophils Absolute: 0.2 10*3/uL (ref 0.0–0.5)
Eosinophils Relative: 3 %
HCT: 39.5 % (ref 39.0–52.0)
Hemoglobin: 12.6 g/dL — ABNORMAL LOW (ref 13.0–17.0)
Immature Granulocytes: 1 %
Lymphocytes Relative: 32 %
Lymphs Abs: 1.9 10*3/uL (ref 0.7–4.0)
MCH: 26.2 pg (ref 26.0–34.0)
MCHC: 31.9 g/dL (ref 30.0–36.0)
MCV: 82.1 fL (ref 80.0–100.0)
Monocytes Absolute: 0.1 10*3/uL (ref 0.1–1.0)
Monocytes Relative: 1 %
Neutro Abs: 3.8 10*3/uL (ref 1.7–7.7)
Neutrophils Relative %: 62 %
Platelets: 237 10*3/uL (ref 150–400)
RBC: 4.81 MIL/uL (ref 4.22–5.81)
RDW: 14.1 % (ref 11.5–15.5)
WBC: 6.1 10*3/uL (ref 4.0–10.5)
nRBC: 0 % (ref 0.0–0.2)

## 2020-02-23 LAB — MAGNESIUM: Magnesium: 2.1 mg/dL (ref 1.7–2.4)

## 2020-02-23 NOTE — Progress Notes (Signed)
Oncology Clinical Pharmacist Note   Adam [name] is a 65 y.o. male with a diagnosis of small cell lung cancer currently on cisplatin/etoposide under the care of Dr. Benay Pike.  Patient received his first cycle which ended on 02/18/20. Pegfilgrastim Ellen Henri) had been ordered for administration today.  Insurance was still pending as of this morning.  Clinical pharmacy reviewed patient's history and treatment parameters for current treatment plan.  It was recommended since [facility] considers this treatment regimen as intermediate risk for febrile neutropenia (10%-20%) to check a CBC with diff today and consider adding filgrastim if patient was neutropenic.  Patient's labs were reviewed today with Dr. Chryl Heck.  Since patient is not neutropenic ([place] ~3800), Dr. Chryl Heck gave orders to discontinue Udenyca. This has been completed.  Adam [name] will continue to monitor for adverse effects and contact clinic if needed prior to their next scheduled infusion on 03/08/20.   Clinical pharmacy will continue to support Adam [name] and Dr. Chryl Heck as needed.  Raina Mina, [place],  02/23/2020  10:31 AM

## 2020-02-23 NOTE — Progress Notes (Signed)
[facility] CONSULT NOTE  Patient Care Team: Patient, No Pcp Per as PCP - General (General Practice) [name], RN as Registered Nurse  CHIEF COMPLAINTS/PURPOSE OF CONSULTATION:  Limited stage small cell lung cancer.  ASSESSMENT & PLAN:  No problem-specific Assessment & Plan notes found for this encounter.  Orders Placed This Encounter  Procedures  . CMP ([facility] only)    Standing Status:   Future    Standing Expiration Date:   02/22/2021   1. Limited stage small cell lung cancer Excellent candidate for the following study.  LIMITED STAGE SMALL CELL LUNG CANCER (LS-SCLC): A PHASE III  RANDOMIZED STUDY OF CHEMORADIATION VERSUS CHEMORADIATION PLUS ATEZOLIZUMAB (10-June-2019)  I have gone over the consent with the patient, discussed side effects of chemotherapy and immunotherapy.  Discussed adverse effects including but not limited to fatigue, nausea, vomiting, diarrhea, increased risk of infections, ototoxicity, nephropathy with chemotherapy.  Discussed adverse effects of chemotherapy including but not limited to fatigue, dry skin, hypo-/hyperthyroidism, nephritis, hepatitis, myocarditis, hypophysitis etc.  Discussed that immunotherapy can eventually affect any organ but most of the side effects are mild to moderate and treatable.  Given the histology and per trial protocol, since we were allowed to use chemotherapy for C1, we proceeded with cisplatin and etoposide. He completed this on 2.10.22. He is here for FU, complains of fatigue secondary to chemotherapy. Rest of the ROS reviewed, no more hemoptysis. PE no concerns, unremarkable. Anticipate consent for the trial and enrolling prior to C2. Concomitant radiation to start from C2.  2. No evidence of chemotherapy induced neutropenia Not a candidate for udencya at this time.  He is willing to proceed with trial as recommended.  Thank you for consulting [place] in the care of this patient.  Please not  hesitate to contact us with any additional questions or concerns.  HISTORY OF PRESENTING ILLNESS:  JENNY OMDAHL 65 y.o. male is here because of new diagnosis of limited stage small cell lung cancer.  [name] 65 y.o. male is here because of hemoptysis  This is a 65 yr old male patient with PMH significant for HTN presented with chief complaint of hemoptysis. He had a dental extraction 3 months ago and soon after noticed scant hemoptysis which he attributed to the dental extraction. He then had two more episodes of scant hemoptysis, he describes it as pea size in his sputum. He may have noticed worsening SOB in the past 3 months but not significant. He lost about 10 lbs of weight in the past several months. He denies any chest pain or worsening cough. No other complaints. No change in bowel or urinary habits. He smoked 20 PPY, quit about 2 yrs ago. He worked as a Probation officer, Theme park manager.  CT chest 02/06/2020 showed right upper lobe mass lesion with associated large nodal mass involving the right suprahilar region and mediastinum in the paratracheal region consistent with primary pulmonary neoplasm and metastatic nodal involvement. CT abdomen pelvis from 02/07/2020 showed an 8 mm nonobstructing calculus in the left kidney, small umbilical hernia, bubbly mixed lytic and sclerotic lesion within the right femoral head of uncertain acuity could represent a small enchondroma, metastatic is considered less likely but not entirely excluded. MRI brain from 02/07/2020 showed single polypoid focus of susceptibility artifact and faint nodular enhancement within the central pons.  Findings favored to reflect a small vascular structure short interval follow-up in 17-month suggested to document stability.  No other MRI evidence of  intracranial metastatic disease. Cytology from bronchoscopy showed malignant cells consistent with small cell carcinoma in the 4R lymph node.  He is an excellent candidate  for the following trial.  LIMITED STAGE SMALL CELL LUNG CANCER (LS-SCLC): A PHASE III  RANDOMIZED STUDY OF CHEMORADIATION VERSUS CHEMORADIATION PLUS ATEZOLIZUMAB (10-June-2019)  He is here for follow up after first cycle of cis/etoposide. C1 D1 02/16/2020-02/18/2020 He is doing really well after the chemo except for fatigue.  No nausea or vomiting today.  No neuropathy or hearing changes.  He had one episode of tinnitus which has resolved spontaneously. He has not had any more episodes of hemoptysis since his hospital visit.  No worsening cough, shortness of breath with exertion. No change in bowel habits. No change in urinary habits. No new neurological complaints.  Rest of the pertinent 10 point ROS reviewed and negative.  REVIEW OF SYSTEMS:    Constitutional: Denies fevers, chills or abnormal night sweats Eyes: Denies blurriness of vision, double vision or watery eyes Ears, nose, mouth, throat, and face: Denies mucositis or sore throat Respiratory: Denies cough, dyspnea or wheezes Cardiovascular: Denies palpitation, chest discomfort or lower extremity swelling Gastrointestinal:  Denies nausea, heartburn or change in bowel habits Skin: Denies abnormal skin rashes Lymphatics: Denies new lymphadenopathy or easy bruising Neurological:Denies numbness, tingling or new weaknesses Behavioral/Psych: Mood is stable, no new changes  All other systems were reviewed with the patient and are negative.  MEDICAL HISTORY:  Past Medical History:  Diagnosis Date  . Hypertension   . Lung cancer ([place])    small cell RUL    SURGICAL HISTORY: Past Surgical History:  Procedure Laterality Date  . BRONCHIAL BRUSHINGS  02/08/2020   Procedure: BRONCHIAL BRUSHINGS;  Surgeon: Freddi Starr, MD;  Location: [facility];  Service: Pulmonary;;  . BRONCHIAL NEEDLE ASPIRATION BIOPSY  02/08/2020   Procedure: BRONCHIAL NEEDLE ASPIRATION BIOPSIES;  Surgeon: Freddi Starr, MD;  Location[facility] ENDOSCOPY;   Service: Pulmonary;;  . KNEE SURGERY Right   . SHOULDER SURGERY Left   . VIDEO BRONCHOSCOPY WITH ENDOBRONCHIAL ULTRASOUND N/A 02/08/2020   Procedure: VIDEO BRONCHOSCOPY WITH ENDOBRONCHIAL ULTRASOUND;  Surgeon: Freddi Starr, MD;  Location: [facility];  Service: Pulmonary;  Laterality: N/A;    SOCIAL HISTORY: Social History   Socioeconomic History  . Marital status: Single    Spouse name: [name]  . Number of children: Not on file  . Years of education: Not on file  . Highest education level: Not on file  Occupational History  . Not on file  Tobacco Use  . Smoking status: Former Smoker    Packs/day: 1.00    Years: 20.00    Pack years: 20.00    Types: Cigarettes    Quit date: 06/09/2018    Years since quitting: 1.7  . Smokeless tobacco: Never Used  Vaping Use  . Vaping Use: Never used  Substance and Sexual Activity  . Alcohol use: Not Currently  . Drug use: Never  . Sexual activity: Yes  Other Topics Concern  . Not on file  Social History Narrative  . Not on file   Social Determinants of Health   Financial Resource Strain: Not on file  Food Insecurity: Not on file  Transportation Needs: Not on file  Physical Activity: Not on file  Stress: Not on file  Social Connections: Not on file  Intimate Partner Violence: Not on file    FAMILY HISTORY: Family History  Problem Relation Age of Onset  . Heart disease Mother   .  Cancer Cousin   . Breast cancer Neg Hx   . Colon cancer Neg Hx   . Pancreatic cancer Neg Hx   . Prostate cancer Neg Hx     ALLERGIES:  has No Known Allergies.  MEDICATIONS:  Current Outpatient Medications  Medication Sig Dispense Refill  . amLODipine (NORVASC) 10 MG tablet Take 10 mg by mouth daily.    Marland Kitchen dexamethasone (DECADRON) 4 MG tablet Take 2 tablets (8 mg total) by mouth daily. Take for 1 day starting the day after chemotherapy on day 4. 30 tablet 1  . ibuprofen (ADVIL) 600 MG tablet Take 600 mg by mouth every 6 (six) hours as  needed. (Patient not taking: No sig reported)    . lidocaine-prilocaine (EMLA) cream Apply to affected area once (Patient not taking: No sig reported) 30 g 3  . lisinopril-hydrochlorothiazide (ZESTORETIC) 20-12.5 MG tablet Take 1 tablet by mouth daily.    Marland Kitchen LORazepam (ATIVAN) 0.5 MG tablet Take 1 tablet (0.5 mg total) by mouth every 6 (six) hours as needed (Nausea or vomiting). (Patient not taking: No sig reported) 30 tablet 0  . ondansetron (ZOFRAN) 8 MG tablet Take 1 tablet (8 mg total) by mouth 2 (two) times daily as needed. Start on the third day after cisplatin chemotherapy. (Patient not taking: No sig reported) 30 tablet 1  . prochlorperazine (COMPAZINE) 10 MG tablet Take 1 tablet (10 mg total) by mouth every 6 (six) hours as needed (Nausea or vomiting). (Patient not taking: No sig reported) 30 tablet 1   No current facility-administered medications for this visit.    PHYSICAL EXAMINATION:  ECOG PERFORMANCE STATUS: 0 - Asymptomatic  Vitals:   02/23/20 0923  BP: 140/86  Pulse: 88  Resp: 18  Temp: 97.7 F (36.5 C)  SpO2: 100%   Filed Weights   02/23/20 0923  Weight: 179 lb (81.2 kg)    GENERAL:alert, no distress and comfortable SKIN: skin color, texture, turgor are normal, no rashes or significant lesions EYES: normal, conjunctiva are pink and non-injected, sclera clear OROPHARYNX:no exudate, no erythema and lips, buccal mucosa, and tongue normal  NECK: supple, thyroid normal size, non-tender, without nodularity LYMPH:  no palpable lymphadenopathy in the cervical, axillary or inguinal LUNGS: clear to auscultation and percussion with normal breathing effort HEART: regular rate & rhythm and no murmurs and no lower extremity edema ABDOMEN:abdomen soft, non-tender and normal bowel sounds Musculoskeletal:no cyanosis of digits and no clubbing  PSYCH: alert & oriented x 3 with fluent speech NEURO: no focal motor/sensory deficits  LABORATORY DATA:  I have reviewed the data as  listed Lab Results  Component Value Date   WBC 6.1 02/23/2020   HGB 12.6 (L) 02/23/2020   HCT 39.5 02/23/2020   MCV 82.1 02/23/2020   PLT 237 02/23/2020     Chemistry      Component Value Date/Time   NA 141 02/23/2020 0936   K 3.5 02/23/2020 0936   CL 100 02/23/2020 0936   CO2 30 02/23/2020 0936   BUN 12 02/23/2020 0936   CREATININE 1.11 02/23/2020 0936      Component Value Date/Time   CALCIUM 9.7 02/23/2020 0936   ALKPHOS 76 02/23/2020 0936   AST 14 (L) 02/23/2020 0936   ALT 23 02/23/2020 0936   BILITOT 0.4 02/23/2020 0936       RADIOGRAPHIC STUDIES: I have personally reviewed the radiological images as listed and agreed with the findings in the report. DG Chest 2 View  Result Date: 02/06/2020 CLINICAL DATA:  Shortness of breath with exertion. Hemoptysis last night. Ex-smoker. EXAM: CHEST - 2 VIEW COMPARISON:  Earlier today. FINDINGS: Again demonstrated is a large suprahilar/mediastinal mass with enlargement of the adjacent superior right hilum. A small amount of patchy and linear density in the right upper lobe is unchanged. Clear left lung. Normal sized heart. Tortuous and partially calcified thoracic aorta. Thoracic spine degenerative changes. IMPRESSION: 1. Stable large right suprahilar/mediastinal mass with enlargement of the adjacent superior right hilum. This remains concerning for malignancy. A follow-up chest CT with contrast is recommended. 2. Stable mild right upper lobe scarring/bullous changes and possible pneumonia or postobstructive changes. Electronically Signed   By: Claudie Revering M.D.   On: 02/06/2020 16:16   DG Chest 2 View  Result Date: 02/06/2020 CLINICAL DATA:  65 year old male with a history of cough and bloody sputum EXAM: CHEST - 2 VIEW COMPARISON:  None available FINDINGS: Cardiac diameter within normal limits. Lobular density in the right suprahilar region, with no comparison. This corresponds to opacity in the hilar and suprahilar region on the lateral  view. Estimated diameter 8.4 cm. Patchy opacity peripheral to the hilar density in the right upper lobe with thickening of the minor fissure. No pleural effusion. No pneumothorax. IMPRESSION: Right suprahilar/mediastinal mass, concerning for malignancy, or less likely vascular pathology. Contrast-enhanced chest CT is recommended Electronically Signed   By: Corrie Mckusick D.O.   On: 02/06/2020 14:57   CT Chest W Contrast  Result Date: 02/06/2020 CLINICAL DATA:  Hemoptysis with right-sided suprahilar/mediastinal mass EXAM: CT CHEST WITH CONTRAST TECHNIQUE: Multidetector CT imaging of the chest was performed during intravenous contrast administration. CONTRAST:  56mL OMNIPAQUE IOHEXOL 300 MG/ML  SOLN COMPARISON:  Chest x-ray from earlier in the same day. FINDINGS: Cardiovascular: Thoracic aorta shows a normal branching pattern. Scattered atherosclerotic calcifications are noted. Coronary calcifications are seen. The heart is not significantly enlarged. Pulmonary artery as visualized is within normal limits. Mediastinum/Nodes: Thoracic inlet is unremarkable. There is a large centrally necrotic mass lesion arising in the right suprahilar region and extending into the mediastinum adjacent to the trachea consistent with lymphadenopathy. This measures approximately 8.9 x 7.4 cm in greatest transverse and AP dimensions. This extends for at least 7.8 cm in craniocaudad projection. Extension into the right hilum is seen. Additionally in the right upper lobe there is a associated mass lesion which measures 2.9 x 2.6 cm. No sizable left hilar adenopathy is noted. The esophagus as visualized is within normal limits. Lungs/Pleura: Lungs are well aerated bilaterally and demonstrate some mild emphysematous changes. The left lung is clear. The right lung as previously described demonstrates a spiculated 2.9 x 2.6 cm mass lesion in the upper lobe consistent with a primary pulmonary neoplasm till proven otherwise. This radiates to  the right suprahilar adenopathy mass. No sizable effusion is seen. No other parenchymal nodule is noted. Upper Abdomen: Visualized upper abdomen demonstrates a 9 mm non-obstructing left renal stone. Adrenal glands are within normal limits. Vague hypodensity is noted within the liver consistent with a small cyst. No definitive metastatic lesions are seen. Musculoskeletal: Degenerative changes of the thoracic spine are seen. IMPRESSION: Right upper lobe mass lesion with associated large nodal mass involving the right suprahilar region and mediastinum in the paratracheal region consistent with primary pulmonary neoplasm and metastatic nodal involvement. Further workup to include tissue sampling is recommended. Nonobstructing left renal stone. Aortic Atherosclerosis (ICD10-I70.0) and Emphysema (ICD10-J43.9). Electronically Signed   By: Inez Catalina M.D.   On: 02/06/2020 20:58   MR  Brain W and Wo Contrast  Result Date: 02/07/2020 CLINICAL DATA:  Initial evaluation for metastatic disease. EXAM: MRI HEAD WITHOUT AND WITH CONTRAST TECHNIQUE: Multiplanar, multiecho pulse sequences of the brain and surrounding structures were obtained without and with intravenous contrast. CONTRAST:  33mL GADAVIST GADOBUTROL 1 MMOL/ML IV SOLN COMPARISON:  None. FINDINGS: Brain: Mild age-related cerebral atrophy. Patchy and confluent T2/FLAIR hyperintensity within the periventricular and deep white matter both cerebral hemispheres, most consistent with chronic small vessel ischemic disease, mild in nature. Superimposed remote lacunar infarct present at the right basal ganglia. Associated chronic hemosiderin staining. No abnormal foci of restricted diffusion to suggest acute or subacute ischemia. Gray-white matter differentiation maintained. No encephalomalacia to suggest chronic cortical infarction elsewhere within the brain. No acute intracranial hemorrhage. Single punctate focus of susceptibility artifact noted within the central pons  (series 14, image 20). Additional faint nodular enhancement seen at this level (series 18, image 20). Finding favored to reflect a small vascular structure. No other foci of susceptibility artifact to suggest chronic intracranial hemorrhage. No mass lesion, midline shift, or mass effect. No hydrocephalus or extra-axial fluid collection. Pituitary gland suprasellar region within normal limits. Midline structures intact and normal. No other abnormal enhancement or evidence for intracranial metastatic disease. Vascular: Major intracranial vascular flow voids are maintained. Skull and upper cervical spine: Craniocervical junction within normal limits. Bone marrow signal intensity normal. No focal marrow replacing lesion. Scalp soft tissues within normal limits. Sinuses/Orbits: Globes and orbital soft tissues within normal limits. Paranasal sinuses are largely clear. No mastoid effusion. Inner ear structures grossly normal. Other: None. IMPRESSION: 1. Single punctate focus of susceptibility artifact and faint nodular enhancement within the central pons as above. Finding favored to reflect a small vascular structure. Short interval follow-up MRI in 3 months suggested to document stability. 2. No other MRI evidence for intracranial metastatic disease. 3. 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 Electronically Signed   By: Jeannine Boga M.D.   On: 02/07/2020 02:55   CT ABDOMEN PELVIS W CONTRAST  Result Date: 02/07/2020 CLINICAL DATA:  Cancer of unknown primary question pulmonary malignancy by prior CT chest EXAM: CT ABDOMEN AND PELVIS WITH CONTRAST TECHNIQUE: Multidetector CT imaging of the abdomen and pelvis was performed using the standard protocol following bolus administration of intravenous contrast. Sagittal and coronal MPR images reconstructed from axial data set. CONTRAST:  143mL OMNIPAQUE IOHEXOL 300 MG/ML  SOLN IV. No oral contrast. COMPARISON:  CT chest 02/06/2020 FINDINGS: Lower chest: Lung bases clear Hepatobiliary: Tiny cyst LEFT lobe liver 9 x 6 mm image 13. Gallbladder and liver otherwise normal appearance Pancreas: Normal appearance Spleen: Normal appearance. Two tiny splenule is noted inferior to spleen. Adrenals/Urinary Tract: 8 mm nonobstructing calculus inferior LEFT kidney image 30. Adrenal glands, kidneys, and ureters otherwise normal appearance. Bladder wall appears prominent but bladder is decompressed likely artifactual and contains excreted contrast from prior CT. Stomach/Bowel: Normal appendix. Stomach and bowel loops normal appearance Vascular/Lymphatic: Atherosclerotic calcifications aorta and iliac arteries. Aorta normal caliber. No adenopathy. Reproductive: Unremarkable Other: No free air or free fluid. Small umbilical hernia containing fat. Musculoskeletal: Bubbly mixed lytic and sclerotic lesion within the RIGHT femoral head of uncertain acuity, could represent a small enchondroma, metastasis considered less likely but not entirely excluded. Degenerative disc disease changes of thoracolumbar spine. No additional focal bone lesions. IMPRESSION: 8 mm nonobstructing calculus inferior LEFT kidney. Small umbilical hernia containing fat. Bubbly mixed lytic and sclerotic  lesion within the RIGHT femoral head of uncertain acuity, could represent a small enchondroma, metastasis considered less likely but not entirely excluded. No other intra-abdominal or intrapelvic abnormalities. Aortic Atherosclerosis (ICD10-I70.0). Electronically Signed   By: Lavonia Dana M.D.   On: 02/07/2020 10:21   DG CHEST PORT 1 VIEW  Result Date: 02/08/2020 CLINICAL DATA:  Status post bronchoscopy and biopsy EXAM: PORTABLE CHEST 1 VIEW COMPARISON:  02/06/2020 FINDINGS: Stable heart size. Large right suprahilar/mediastinal mass and irregular right apical nodule/mass are grossly unchanged. Left lung is clear. No pneumothorax.  IMPRESSION: 1. No pneumothorax status post bronchoscopy. 2. Large right suprahilar/mediastinal mass and irregular right apical nodule/mass. Electronically Signed   By: Davina Poke D.O.   On: 02/08/2020 14:43    All questions were answered. The patient knows to call the clinic with any problems, questions or concerns. I spent 40 minutes in the care of this patient including H and P, review of records, counseling and coordination of care. We have spent a lot of time coordinating for the trial for this patient including discussion with trial team, discussing with our research team here to confirm eligibility, pathology department to make sure slides are available per protocol Coordinated with Dr Tammi Klippel for concurrent radiation.     Benay Pike, MD 02/23/2020 10:58 AM

## 2020-02-23 NOTE — Progress Notes (Signed)
BC 

## 2020-02-25 ENCOUNTER — Telehealth: Payer: Self-pay | Admitting: Radiation Oncology

## 2020-02-25 NOTE — Telephone Encounter (Signed)
Confirmed w/patient appt for tomorrow to arrive at 9am for CT [facility] w/IV start.

## 2020-02-25 NOTE — Progress Notes (Signed)
  Radiation Oncology         [phone] ________________________________  Name: Adam [name] MRN: [id number]  Date: 02/26/2020  DOB: 1955-12-28  SIMULATION AND TREATMENT PLANNING NOTE    ICD-10-CM   1. Primary cancer of right upper lobe of lung (HCC)  C34.11    DIAGNOSIS: 65 yo gentleman with hemoptysis from T1 N2 M0 small cell carcinoma of the right upper lung, Limited Stage, Stage IIIA  NARRATIVE:  The patient was brought to the [facility].  Identity was confirmed.  All relevant records and images related to the planned course of therapy were reviewed.  The patient freely provided informed written consent to proceed with treatment after reviewing the details related to the planned course of therapy. The consent form was witnessed and verified by the simulation staff.  Then, the patient was set-up in a stable reproducible  supine position for radiation therapy.  CT images were obtained.  4D CT imaging was obtained to account for respiratory excursion.  CT slice thickness was < 3 mm.  Surface markings were placed.  The CT images were loaded into the planning software.  Then the target and avoidance structures were contoured.  Treatment planning then occurred.  The radiation prescription was entered and confirmed.  Then, I designed and supervised the construction of a total of 6 medically necessary complex treatment devices, including a BodyFix immobilization mold custom fitted to the patient along with 5 multileaf collimators conformally shaped radiation around the treatment target while shielding critical structures such as the heart and spinal cord maximally.  I have requested : 3D Simulation  I have requested a DVH of the following structures: Left lung, right lung, spinal cord, heart, esophagus, and target.  I have ordered:Nutrition Consult  SPECIAL TREATMENT PROCEDURE:  The planned course of therapy using radiation constitutes a special treatment procedure. Special care is  required in the management of this patient for the following reasons.  The patient will be receiving concurrent chemotherapy requiring careful monitoring for increased toxicities of treatment including periodic laboratory values.  The special nature of the planned course of radiotherapy will require increased physician supervision and oversight to ensure patient's safety with optimal treatment outcomes.  PLAN:  The patient will receive 66 Gy in 33 fractions.  ________________________________  Sheral Apley Tammi Klippel, M.D.

## 2020-02-26 ENCOUNTER — Other Ambulatory Visit: Payer: Self-pay

## 2020-02-26 ENCOUNTER — Ambulatory Visit
Admission: RE | Admit: 2020-02-26 | Discharge: 2020-02-26 | Disposition: A | Discharge status: Home / Self Care | Payer: 59 | Source: Ambulatory Visit | Attending: Radiation Oncology | Admitting: Radiation Oncology

## 2020-02-26 VITALS — BP 153/84 | HR 88 | Temp 97.4°F | Resp 18 | Ht 69.0 in | Wt 179.4 lb

## 2020-02-26 DIAGNOSIS — Z510 Encounter for antineoplastic radiation therapy: Secondary | ICD-10-CM | POA: Diagnosis not present

## 2020-02-26 DIAGNOSIS — C3411 Malignant neoplasm of upper lobe, right bronchus or lung: Secondary | ICD-10-CM

## 2020-02-26 MED ORDER — SODIUM CHLORIDE 0.9% FLUSH
10.0000 mL | Freq: Once | INTRAVENOUS | Status: AC
  Administered 2020-02-26: 10 mL via INTRAVENOUS

## 2020-02-26 NOTE — Progress Notes (Signed)
Has armband been applied?  Yes.    Does patient have an allergy to IV contrast dye?: No.   Has patient ever received premedication for IV contrast dye?: No.   Does patient take metformin?: No.  If patient does take metformin when was the last dose: n/a  Date of lab work: 02/23/2020 BUN: 12 CR: 1.11  IV site: antecubital right, condition patent and no redness  Has IV site been added to flowsheet?  Yes.    BP (!) 153/84   Pulse 88   Temp (!) 97.4 F (36.3 C)   Resp 18   Ht 5\' 9"  (1.753 m)   Wt 179 lb 6.4 oz (81.4 kg)   SpO2 100%   BMI 26.49 kg/m

## 2020-03-01 ENCOUNTER — Inpatient Hospital Stay (HOSPITAL_BASED_OUTPATIENT_CLINIC_OR_DEPARTMENT_OTHER): Payer: 59 | Admitting: Medical

## 2020-03-01 ENCOUNTER — Other Ambulatory Visit: Payer: Self-pay

## 2020-03-01 VITALS — BP 136/85 | HR 84 | Temp 98.4°F | Resp 17 | Ht 69.0 in | Wt 181.0 lb

## 2020-03-01 DIAGNOSIS — Z5111 Encounter for antineoplastic chemotherapy: Secondary | ICD-10-CM | POA: Diagnosis not present

## 2020-03-01 DIAGNOSIS — C3411 Malignant neoplasm of upper lobe, right bronchus or lung: Secondary | ICD-10-CM

## 2020-03-01 DIAGNOSIS — Z510 Encounter for antineoplastic radiation therapy: Secondary | ICD-10-CM | POA: Diagnosis not present

## 2020-03-01 NOTE — Research (Signed)
Trial:  [id number] TESTING THE ADDITION OF ATEZOLIZUMAB TO THE USUAL CHEMORADIATION TREATMENT FOR LIMITED STAGE SMALL CELL LUNG CANCER  03/01/2020 10:15AM  PATIENT UPDATED: Adam [name] arrives unaccompanied this morning for a scheduled visit. I have an opportunity to meet with him privately to update him that we are awaiting SCOR approval for Dr. Chryl Heck so enrollment can proceed: he verbalizes understanding and verbalizes appreciation for the efforts to get him enrolled. I advise him that I will continue to keep him updated as information becomes available. All questions are answered to his satisfaction. Rahm is thanked for his time and continued interest in the [place] study.  [name]. Sharlett Iles, BSN, RN, CIC 03/01/2020 10:29 AM

## 2020-03-02 ENCOUNTER — Other Ambulatory Visit: Payer: Self-pay | Admitting: Hematology and Oncology

## 2020-03-02 ENCOUNTER — Other Ambulatory Visit: Payer: Self-pay

## 2020-03-02 DIAGNOSIS — C3411 Malignant neoplasm of upper lobe, right bronchus or lung: Secondary | ICD-10-CM

## 2020-03-02 NOTE — Progress Notes (Signed)
Hepatitis panel and PFT ordered for protocol

## 2020-03-03 ENCOUNTER — Other Ambulatory Visit: Payer: Self-pay

## 2020-03-03 ENCOUNTER — Inpatient Hospital Stay: Payer: 59

## 2020-03-03 DIAGNOSIS — Z5111 Encounter for antineoplastic chemotherapy: Secondary | ICD-10-CM | POA: Diagnosis not present

## 2020-03-03 DIAGNOSIS — C3411 Malignant neoplasm of upper lobe, right bronchus or lung: Secondary | ICD-10-CM

## 2020-03-03 LAB — HEPATITIS C ANTIBODY: HCV Ab: NONREACTIVE

## 2020-03-03 LAB — HEPATITIS B SURFACE ANTIGEN: Hepatitis B Surface Ag: NONREACTIVE

## 2020-03-03 NOTE — Research (Addendum)
[id number] TESTING THE ADDITION OF ATEZOLIZUMAB TO THE USUAL CHEMORADIATION TREATMENT FOR LIMITED STAGE SMALL CELL LUNG CANCER  03/03/2020 0830AM  CONSENT: Patient GIAN YBARRA was identified by Dr. Chryl Heck as a potential candidate for the above listed study.  This Clinical Research Nurse met with [name], [id number] on 03/03/20 in a manner and location that ensures patient privacy to discuss participation in the above listed research study.  Patient is Unaccompanied.  Patient was previously provided with informed consent documents.  Patient confirmed they have read the informed consent documents. To ensure understanding, each page of the consent form was reviewed again and all questions addressed.  As outlined in the informed consent form, this Nurse and [name] discussed the purpose of the research study, the investigational nature of the study, study procedures and requirements for study participation, potential risks and benefits of study participation, as well as alternatives to participation.  The patient understands participation is voluntary and they may withdraw from study participation at any time.  Each study arm was reviewed, and randomization discussed.  Potential side effects were reviewed with patient as outlined in the consent form, and patient made aware there may be side effects not yet known. This study does not involve a placebo. Patient confirms that he is currently not sexually active and is aware that an effective means of contraception is required if necessary: he verbalizes understanding. Eligibility for enrollment has been confirmed by myself and clinical research coordinator [employer].   Confidentiality and how the patient's information will be used as part of study participation were discussed.  Patient was informed there is not reimbursement provided for their time and effort spent on trial participation.  The patient is encouraged to discuss research study  participation with their insurance provider to determine what costs they may incur as part of study participation, including research related injury.    All questions were answered to patient's satisfaction. The informed consent and separate HIPAA Authorization was reviewed page by page.  The patient's mental and emotional status is appropriate to provide informed consent, and the patient verbalizes an understanding of study participation.  Patient has agreed to participate in the above listed research study and has voluntarily signed the informed consent version 11/03/19 ([place] active date 12/15/19) and separate HIPAA Authorization, version 06/05/17 on 03/03/20 at 0853 AM.  The patient was provided with a copy of the signed informed consent form and separate HIPAA Authorization for their reference.  No study specific procedures were obtained prior to the signing of the informed consent document.  Approximately 30 minutes were spent with the patient reviewing the informed consent documents.  After obtaining informed consent patient, voluntarily signed the optional Release of Information form for use throughout trial participation. I also verified the patient has not received prior treatment through the [place] (information needed for enrollment).   LAB: Following informed consent, Larin was escorted to the lab for hepatitis B/C testing since he has never been tested. Once hepatitis status is verified, Nycere will be registered and enrolled to the [place] study.  PFTs: While Kayvon is here, clinical research nurse Doristine Johns calls respiratory therapy to confirm receipt of PFT orders (required prior to protocol treatment). Per Judeen Hammans in respiratory, the PFTs are scheduled for Wednesday, 03/09/20, at [place] (first available appointment). She states Dhairya must have a mandatory Covid screening prior to PFTs and schedules an appointment for Covid testing on Monday, 03/07/20, following his provider appointment. Lucca is advised  of the  appointment times and is given a printed copy of his schedule. He is aware there may be a delay in treatment pending arm assignment (determined at registration).   PATHOLOGY REQUEST: Request for tissue block was faxed to [name] at [phone].  Current plan is to update the care team of the above activities, enroll once hepatitis status is determined and verify treatment assignment for planning.  [name]. Sharlett Iles, BSN, RN, CIC 03/03/2020 1:22 PM

## 2020-03-04 ENCOUNTER — Telehealth: Payer: Self-pay

## 2020-03-04 DIAGNOSIS — C3411 Malignant neoplasm of upper lobe, right bronchus or lung: Secondary | ICD-10-CM

## 2020-03-04 NOTE — Telephone Encounter (Signed)
[id number] TESTING THE ADDITION OF ATEZOLIZUMAB TO THE USUAL CHEMORADIATION TREATMENT FOR LIMITED STAGE SMALL CELL LUNG CANCER  03/04/2020 1300PM  OUTGOING CALL: Outgoing call to Jeneen Rinks: I verified that I was speaking with the correct patient. Adam [name] is notified of study enrollment and randomization assignment. He is advised that his appointment with Dr. Chryl Heck on Monday, 03/07/20 will be rescheduled for the following Monday, 03/14/20 and that his next treatment will occur on Tuesday, 03/15/20; Adam [name] verbalizes understanding and is in agreement with the current plan.   Adam [name] is reminded to present to the Covid testing site on Monday, 03/14/20 at 10:45am: he has the address for the testing site. He is also reminded to present to [facility] on Wednesday, 03/09/20 at 9:00am for PFTs and he verbalizes understanding.   Adam [name] is thanked for his time and participation in the [place] study. He is encouraged to call me directly with any questions or concerns and my direct call back number is provided.  Current plan is to proceed with study participation in the standard treatment group with required PFTs being completed on Wednesday, 03/09/20. All RT plans have been submitted for review by Dr. Iran Planas and block has been requested. Will plan next treatment for Tuesday, 03/15/20.  [name]. Sharlett Iles, BSN, RN, CIC 03/04/2020 4:02 PM

## 2020-03-04 NOTE — Research (Signed)
[id number] TESTING THE ADDITION OF ATEZOLIZUMAB TO THE USUAL CHEMORADIATION TREATMENT FOR LIMITED STAGE SMALL CELL LUNG CANCER  03/04/2020 1100AM  Patient Adam [name], was registered to the above listed study, assigned study# [id number]. Randomization is required for this study. Randomization information to follow.   Patient Adam [name] is randomized to Arm 1: chemotherapy + radiation (usual approach). Stratification criteria were confirmed by this clinical research Nurse and clinical research Nurse Doristine Johns verified the stratification factors used for randomization.   As part of the assigned treatment arm, patient Adam [name] will receive the usual treatment. MD will be notified of patient assignment; treatment will begin 03/15/20.  Per study protocol, patient Adam [name] will be notified of the treatment assignment. Patient Adam [name] is successfully enrolled in the above study.  [name]. Sharlett Iles, BSN, RN, CIC 03/04/2020 4:01 PM

## 2020-03-04 NOTE — Progress Notes (Signed)
[facility] Progress Note   [name] [id number] [birth year] 65 y.o.  [name] is managed by Dr. 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 Sprinkle presents to the clinic today status post cycle one, day one of cisplatin and etoposide which was dosed on 02/16/2020. He is pending start of radiation therapy. He is scheduled to return for chemotherapy only on 03/08/2020 and will be seen in follow-up on 03/15/2020.  Please see After Visit Summary for patient specific instructions.  Future Appointments  Date Time Provider [place]  03/07/2020  8:00 AM [facility] LAB [facility] None  03/07/2020  8:40 AM Iruku, Arletha Pili, MD [facility] None  03/07/2020 10:45 AM [facility]-SCREENING [facility]-SDSC None  03/08/2020  8:30 AM Tyler Pita, MD [facility] None  03/08/2020  9:00 AM [facility] INFUSION [facility] None  03/09/2020  8:45 AM [facility] INFUSION [facility] None  03/09/2020  9:00 AM [facility]-RESPTX TECH [facility]-RESPTX None  03/09/2020  1:00 PM [facility] [id number] [facility] None  03/10/2020  8:45 AM [facility] INFUSION [facility] None  03/10/2020  1:15 PM [facility] LINAC 4 [facility] None  03/11/2020  9:15 AM [facility] LINAC 4 [facility] None  03/14/2020 11:15 AM [facility] LINAC 4 [facility] None  03/15/2020  9:15 AM [facility] LINAC 4 [facility] None  03/15/2020  9:40 AM Iruku, Praveena, MD [facility] None  03/16/2020  9:15 AM [facility] LINAC 4 [facility] None  03/17/2020  9:15 AM [facility] LINAC 4 [facility] None  03/18/2020  9:15 AM [facility] LINAC 4 [facility] None   03/21/2020  8:45 AM [facility] LINAC 4 [facility] None  03/22/2020  8:45 AM [facility] LINAC 4 [facility] None  03/22/2020  9:40 AM Iruku, Arletha Pili, MD [facility] None  03/23/2020  8:45 AM [facility] LINAC 4 [facility] None  03/24/2020  8:45 AM [facility] LINAC 4 [facility] None  03/25/2020  8:45 AM [facility] LINAC 4 [facility] None  03/28/2020  8:45 AM [facility] LINAC 4 [facility] None  03/29/2020  7:45 AM [facility] [id number] [facility] None  03/29/2020  8:15 AM [facility] LAB [facility] None  03/29/2020  8:40 AM Iruku, Praveena, MD [facility] None  03/29/2020  9:00 AM [facility] INFUSION [facility] None  03/30/2020  8:45 AM [facility] LINAC 4 [facility] None  03/30/2020  9:30 AM [facility] INFUSION [facility] None  03/31/2020  8:45 AM [facility] LINAC 4 [facility] None  03/31/2020  9:30 AM [facility] INFUSION [facility] None  04/01/2020  8:45 AM [facility] LINAC 4 [facility] None  04/04/2020  8:30 AM [facility] LINAC 4 [facility] None  04/04/2020 10:45 AM [facility]-SCREENING [facility]-SDSC None  04/05/2020  8:00 AM [facility] LINAC 4 [facility] None  04/06/2020  8:00 AM [facility] LINAC 4 [facility] None  04/07/2020  8:00 AM [facility] LINAC 4 [facility] None  04/08/2020  8:00 AM [facility] LINAC 4 [facility] None  04/11/2020  8:00 AM [facility] LINAC 4 [facility] None  04/12/2020  8:05 AM [facility] LINAC 4 [facility] None  04/13/2020  8:00 AM [facility] LINAC 4 [facility] None  04/14/2020  8:00 AM [facility] LINAC 4 [facility] None  04/15/2020  8:00 AM [facility] LINAC 4 [facility] None  04/18/2020  8:00 AM [facility] LINAC 4 [facility] None  04/19/2020  8:00 AM [facility] LINAC 4 [facility]  None  04/20/2020  8:00 AM [facility] LINAC 4 [facility] None  04/21/2020  8:00 AM [facility] LINAC 4 [facility] None    No orders of the defined types were placed in this encounter.      Subjective:   Patient ID:  BRENN [name] is a 65 y.o. (DOB 1955-03-16) male.  Chief  Complaint: No chief complaint on file.   HPI Adam [name]  is a 65 y.o. male with a diagnosis of a limited stage small cell carcinoma of the right lung. He presents to the clinic today status post cycle one, day one of cisplatin and etoposide which was dosed on 02/16/2020. He is pending start of radiation therapy. He is scheduled to return for chemotherapy only on 03/08/2020 and will be seen in follow-up by Dr. Chryl Heck on 03/15/2020. He denies any issues of concern today. He denies fevers, chills, sweats, nausea, vomiting, constipation, or diarrhea. He has no pain.    Medications: I have reviewed the patient's current medications.  Allergies: No Known Allergies  Past Medical History:  Diagnosis Date  . Hypertension   . Lung cancer ([place])    small cell RUL    Past Surgical History:  Procedure Laterality Date  . BRONCHIAL BRUSHINGS  02/08/2020   Procedure: BRONCHIAL BRUSHINGS;  Surgeon: Freddi Starr, MD;  Location: [facility];  Service: Pulmonary;;  . BRONCHIAL NEEDLE ASPIRATION BIOPSY  02/08/2020   Procedure: BRONCHIAL NEEDLE ASPIRATION BIOPSIES;  Surgeon: Freddi Starr, MD;  Location: [facility] ENDOSCOPY;  Service: Pulmonary;;  . KNEE SURGERY Right   . SHOULDER SURGERY Left   . VIDEO BRONCHOSCOPY WITH ENDOBRONCHIAL ULTRASOUND N/A 02/08/2020   Procedure: VIDEO BRONCHOSCOPY WITH ENDOBRONCHIAL ULTRASOUND;  Surgeon: Freddi Starr, MD;  Location: [facility];  Service: Pulmonary;  Laterality: N/A;    Family History  Problem Relation Age of Onset  . Heart disease Mother   . Cancer Cousin   . Breast cancer Neg Hx   . Colon cancer Neg Hx   . Pancreatic cancer Neg Hx   . Prostate cancer Neg Hx     Social History   Socioeconomic History  . Marital status: Single    Spouse name: [name]  . Number of children: Not on file  . Years of education: Not on file  . Highest education level: Not on file  Occupational History  . Not on file  Tobacco Use  . Smoking status: Former  Smoker    Packs/day: 1.00    Years: 20.00    Pack years: 20.00    Types: Cigarettes    Quit date: 06/09/2018    Years since quitting: 1.7  . Smokeless tobacco: Never Used  Vaping Use  . Vaping Use: Never used  Substance and Sexual Activity  . Alcohol use: Not Currently  . Drug use: Never  . Sexual activity: Yes  Other Topics Concern  . Not on file  Social History Narrative  . Not on file   Social Determinants of Health   Financial Resource Strain: Not on file  Food Insecurity: Not on file  Transportation Needs: Not on file  Physical Activity: Not on file  Stress: Not on file  Social Connections: Not on file  Intimate Partner Violence: Not on file    Past Medical History, Surgical history, Social history, and Family history were reviewed and updated as appropriate.   Please see review of systems for further details on the patient's review from today.   Review of Systems:  Review of Systems  Constitutional: Negative for chills, diaphoresis and fever.  HENT: Negative for trouble swallowing and voice change.   Respiratory: Negative for cough, chest tightness, shortness of breath and wheezing.   Cardiovascular: Negative for chest pain and palpitations.  Gastrointestinal: Negative for abdominal pain, constipation, diarrhea, nausea and vomiting.  Musculoskeletal: Negative for back pain and myalgias.  Neurological: Negative for dizziness, light-headedness and headaches.    Objective:   Physical Exam:  BP 136/85 (BP Location: Left Arm, Patient Position: Sitting)   Pulse 84   Temp 98.4 F (36.9 C) (Tympanic)   Resp 17   Ht 5\' 9"  (1.753 m)   Wt 181 lb (82.1 kg)   SpO2 100%   BMI 26.73 kg/m  ECOG: 0  Physical Exam Constitutional:      General: He is not in acute distress.    Appearance: He is not diaphoretic.  HENT:     Head: Normocephalic and atraumatic.  Eyes:     General: No scleral icterus.       Right eye: No discharge.        Left eye: No discharge.      Conjunctiva/sclera: Conjunctivae normal.  Cardiovascular:     Rate and Rhythm: Normal rate and regular rhythm.     Heart sounds: Normal heart sounds. No murmur heard. No friction rub. No gallop.   Pulmonary:     Effort: Pulmonary effort is normal. No respiratory distress.     Breath sounds: Normal breath sounds. No wheezing or rales.  Skin:    General: Skin is warm and dry.     Findings: No erythema or rash.  Neurological:     Mental Status: He is alert.     Coordination: Coordination normal.     Gait: Gait normal.  Psychiatric:        Mood and Affect: Mood normal.        Behavior: Behavior normal.        Thought Content: Thought content normal.        Judgment: Judgment normal.     Lab Review:     Component Value Date/Time   NA 141 02/23/2020 0936   K 3.5 02/23/2020 0936   CL 100 02/23/2020 0936   CO2 30 02/23/2020 0936   GLUCOSE 95 02/23/2020 0936   BUN 12 02/23/2020 0936   CREATININE 1.11 02/23/2020 0936   CALCIUM 9.7 02/23/2020 0936   PROT 7.7 02/23/2020 0936   ALBUMIN 3.5 02/23/2020 0936   AST 14 (L) 02/23/2020 0936   ALT 23 02/23/2020 0936   ALKPHOS 76 02/23/2020 0936   BILITOT 0.4 02/23/2020 0936   GFRNONAA >60 02/23/2020 0936       Component Value Date/Time   WBC 6.1 02/23/2020 0936   RBC 4.81 02/23/2020 0936   HGB 12.6 (L) 02/23/2020 0936   HCT 39.5 02/23/2020 0936   PLT 237 02/23/2020 0936   MCV 82.1 02/23/2020 0936   MCH 26.2 02/23/2020 0936   MCHC 31.9 02/23/2020 0936   RDW 14.1 02/23/2020 0936   LYMPHSABS 1.9 02/23/2020 0936   MONOABS 0.1 02/23/2020 0936   EOSABS 0.2 02/23/2020 0936   BASOSABS 0.0 02/23/2020 0936   -------------------------------  Imaging from last 24 hours (if applicable):  Radiology interpretation: DG Chest 2 View  Result Date: 02/06/2020 CLINICAL DATA:  Shortness of breath with exertion. Hemoptysis last night. Ex-smoker. EXAM: CHEST - 2 VIEW COMPARISON:  Earlier today. FINDINGS: Again demonstrated is a large  suprahilar/mediastinal mass with enlargement of the adjacent superior right hilum.  A small amount of patchy and linear density in the right upper lobe is unchanged. Clear left lung. Normal sized heart. Tortuous and partially calcified thoracic aorta. Thoracic spine degenerative changes. IMPRESSION: 1. Stable large right suprahilar/mediastinal mass with enlargement of the adjacent superior right hilum. This remains concerning for malignancy. A follow-up chest CT with contrast is recommended. 2. Stable mild right upper lobe scarring/bullous changes and possible pneumonia or postobstructive changes. Electronically Signed   By: Claudie Revering M.D.   On: 02/06/2020 16:16   DG Chest 2 View  Result Date: 02/06/2020 CLINICAL DATA:  65 year old male with a history of cough and bloody sputum EXAM: CHEST - 2 VIEW COMPARISON:  None available FINDINGS: Cardiac diameter within normal limits. Lobular density in the right suprahilar region, with no comparison. This corresponds to opacity in the hilar and suprahilar region on the lateral view. Estimated diameter 8.4 cm. Patchy opacity peripheral to the hilar density in the right upper lobe with thickening of the minor fissure. No pleural effusion. No pneumothorax. IMPRESSION: Right suprahilar/mediastinal mass, concerning for malignancy, or less likely vascular pathology. Contrast-enhanced chest CT is recommended Electronically Signed   By: Corrie Mckusick D.O.   On: 02/06/2020 14:57   CT Chest W Contrast  Result Date: 02/06/2020 CLINICAL DATA:  Hemoptysis with right-sided suprahilar/mediastinal mass EXAM: CT CHEST WITH CONTRAST TECHNIQUE: Multidetector CT imaging of the chest was performed during intravenous contrast administration. CONTRAST:  66mL OMNIPAQUE IOHEXOL 300 MG/ML  SOLN COMPARISON:  Chest x-ray from earlier in the same day. FINDINGS: Cardiovascular: Thoracic aorta shows a normal branching pattern. Scattered atherosclerotic calcifications are noted. Coronary  calcifications are seen. The heart is not significantly enlarged. Pulmonary artery as visualized is within normal limits. Mediastinum/Nodes: Thoracic inlet is unremarkable. There is a large centrally necrotic mass lesion arising in the right suprahilar region and extending into the mediastinum adjacent to the trachea consistent with lymphadenopathy. This measures approximately 8.9 x 7.4 cm in greatest transverse and AP dimensions. This extends for at least 7.8 cm in craniocaudad projection. Extension into the right hilum is seen. Additionally in the right upper lobe there is a associated mass lesion which measures 2.9 x 2.6 cm. No sizable left hilar adenopathy is noted. The esophagus as visualized is within normal limits. Lungs/Pleura: Lungs are well aerated bilaterally and demonstrate some mild emphysematous changes. The left lung is clear. The right lung as previously described demonstrates a spiculated 2.9 x 2.6 cm mass lesion in the upper lobe consistent with a primary pulmonary neoplasm till proven otherwise. This radiates to the right suprahilar adenopathy mass. No sizable effusion is seen. No other parenchymal nodule is noted. Upper Abdomen: Visualized upper abdomen demonstrates a 9 mm non-obstructing left renal stone. Adrenal glands are within normal limits. Vague hypodensity is noted within the liver consistent with a small cyst. No definitive metastatic lesions are seen. Musculoskeletal: Degenerative changes of the thoracic spine are seen. IMPRESSION: Right upper lobe mass lesion with associated large nodal mass involving the right suprahilar region and mediastinum in the paratracheal region consistent with primary pulmonary neoplasm and metastatic nodal involvement. Further workup to include tissue sampling is recommended. Nonobstructing left renal stone. Aortic Atherosclerosis (ICD10-I70.0) and Emphysema (ICD10-J43.9). Electronically Signed   By: Inez Catalina M.D.   On: 02/06/2020 20:58   MR Brain W  and Wo Contrast  Result Date: 02/07/2020 CLINICAL DATA:  Initial evaluation for metastatic disease. EXAM: MRI HEAD WITHOUT AND WITH CONTRAST TECHNIQUE: Multiplanar, multiecho pulse sequences of the brain and surrounding  structures were obtained without and with intravenous contrast. CONTRAST:  43mL GADAVIST GADOBUTROL 1 MMOL/ML IV SOLN COMPARISON:  None. FINDINGS: Brain: Mild age-related cerebral atrophy. Patchy and confluent T2/FLAIR hyperintensity within the periventricular and deep white matter both cerebral hemispheres, most consistent with chronic small vessel ischemic disease, mild in nature. Superimposed remote lacunar infarct present at the right basal ganglia. Associated chronic hemosiderin staining. No abnormal foci of restricted diffusion to suggest acute or subacute ischemia. Gray-white matter differentiation maintained. No encephalomalacia to suggest chronic cortical infarction elsewhere within the brain. No acute intracranial hemorrhage. Single punctate focus of susceptibility artifact noted within the central pons (series 14, image 20). Additional faint nodular enhancement seen at this level (series 18, image 20). Finding favored to reflect a small vascular structure. No other foci of susceptibility artifact to suggest chronic intracranial hemorrhage. No mass lesion, midline shift, or mass effect. No hydrocephalus or extra-axial fluid collection. Pituitary gland suprasellar region within normal limits. Midline structures intact and normal. No other abnormal enhancement or evidence for intracranial metastatic disease. Vascular: Major intracranial vascular flow voids are maintained. Skull and upper cervical spine: Craniocervical junction within normal limits. Bone marrow signal intensity normal. No focal marrow replacing lesion. Scalp soft tissues within normal limits. Sinuses/Orbits: Globes and orbital soft tissues within normal limits. Paranasal sinuses are largely clear. No mastoid effusion. Inner  ear structures grossly normal. Other: None. IMPRESSION: 1. Single punctate focus of susceptibility artifact and faint nodular enhancement within the central pons as above. Finding favored to reflect a small vascular structure. Short interval follow-up MRI in 3 months suggested to document stability. 2. No other MRI evidence for intracranial metastatic disease. 3. 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 Electronically Signed   By: Jeannine Boga M.D.   On: 02/07/2020 02:55   CT ABDOMEN PELVIS W CONTRAST  Result Date: 02/07/2020 CLINICAL DATA:  Cancer of unknown primary question pulmonary malignancy by prior CT chest EXAM: CT ABDOMEN AND PELVIS WITH CONTRAST TECHNIQUE: Multidetector CT imaging of the abdomen and pelvis was performed using the standard protocol following bolus administration of intravenous contrast. Sagittal and coronal MPR images reconstructed from axial data set. CONTRAST:  158mL OMNIPAQUE IOHEXOL 300 MG/ML SOLN IV. No oral contrast. COMPARISON:  CT chest 02/06/2020 FINDINGS: Lower chest: Lung bases clear Hepatobiliary: Tiny cyst LEFT lobe liver 9 x 6 mm image 13. Gallbladder and liver otherwise normal appearance Pancreas: Normal appearance Spleen: Normal appearance. Two tiny splenule is noted inferior to spleen. Adrenals/Urinary Tract: 8 mm nonobstructing calculus inferior LEFT kidney image 30. Adrenal glands, kidneys, and ureters otherwise normal appearance. Bladder wall appears prominent but bladder is decompressed likely artifactual and contains excreted contrast from prior CT. Stomach/Bowel: Normal appendix. Stomach and bowel loops normal appearance Vascular/Lymphatic: Atherosclerotic calcifications aorta and iliac arteries. Aorta normal caliber. No adenopathy. Reproductive: Unremarkable Other: No free air or free fluid. Small umbilical hernia containing fat. Musculoskeletal: Bubbly  mixed lytic and sclerotic lesion within the RIGHT femoral head of uncertain acuity, could represent a small enchondroma, metastasis considered less likely but not entirely excluded. Degenerative disc disease changes of thoracolumbar spine. No additional focal bone lesions. IMPRESSION: 8 mm nonobstructing calculus inferior LEFT kidney. Small umbilical hernia containing fat. Bubbly mixed lytic and sclerotic lesion within the RIGHT femoral head of uncertain acuity, could represent a small enchondroma, metastasis considered less likely but not entirely excluded. No other intra-abdominal or intrapelvic abnormalities. Aortic Atherosclerosis (ICD10-I70.0). Electronically Signed  By: Lavonia Dana M.D.   On: 02/07/2020 10:21   DG CHEST PORT 1 VIEW  Result Date: 02/08/2020 CLINICAL DATA:  Status post bronchoscopy and biopsy EXAM: PORTABLE CHEST 1 VIEW COMPARISON:  02/06/2020 FINDINGS: Stable heart size. Large right suprahilar/mediastinal mass and irregular right apical nodule/mass are grossly unchanged. Left lung is clear. No pneumothorax. IMPRESSION: 1. No pneumothorax status post bronchoscopy. 2. Large right suprahilar/mediastinal mass and irregular right apical nodule/mass. Electronically Signed   By: Davina Poke D.O.   On: 02/08/2020 14:43

## 2020-03-07 ENCOUNTER — Other Ambulatory Visit (HOSPITAL_COMMUNITY)
Admission: RE | Admit: 2020-03-07 | Discharge: 2020-03-07 | Disposition: A | Discharge status: Home / Self Care | Payer: 59 | Source: Ambulatory Visit | Attending: Pulmonary Disease | Admitting: Pulmonary Disease

## 2020-03-07 ENCOUNTER — Ambulatory Visit: Payer: 59 | Admitting: Hematology and Oncology

## 2020-03-07 ENCOUNTER — Other Ambulatory Visit: Payer: 59

## 2020-03-07 DIAGNOSIS — Z20822 Contact with and (suspected) exposure to covid-19: Secondary | ICD-10-CM | POA: Diagnosis not present

## 2020-03-07 DIAGNOSIS — Z01812 Encounter for preprocedural laboratory examination: Secondary | ICD-10-CM | POA: Diagnosis present

## 2020-03-08 ENCOUNTER — Other Ambulatory Visit: Payer: Self-pay | Admitting: Hematology and Oncology

## 2020-03-08 ENCOUNTER — Other Ambulatory Visit: Payer: 59

## 2020-03-08 ENCOUNTER — Ambulatory Visit: Payer: 59 | Admitting: Radiation Oncology

## 2020-03-08 ENCOUNTER — Ambulatory Visit: Payer: 59

## 2020-03-08 ENCOUNTER — Telehealth: Payer: Self-pay

## 2020-03-08 DIAGNOSIS — C3411 Malignant neoplasm of upper lobe, right bronchus or lung: Secondary | ICD-10-CM

## 2020-03-08 LAB — SARS CORONAVIRUS 2 (TAT 6-24 HRS): SARS Coronavirus 2: NEGATIVE

## 2020-03-08 NOTE — Telephone Encounter (Signed)
[id number] TESTING THE ADDITION OF ATEZOLIZUMAB TO THE USUAL CHEMORADIATION TREATMENT FOR LIMITED STAGE SMALL CELL LUNG CANCER  03/08/2020 1030AM  OUTGOING CALL: Outgoing call to Jeneen Rinks: I verified that I was speaking with the correct patient. [place] is advised to arrive to the main [place] tomorrow (03/09/20) by 9am for PFTs. He is to present to the main entrance (entrance A) off of [address]. Where the [place] parking is located. Mikle states he knows exactly where that entrance is and verbalizes understanding. I remind him of his appointment with Dr. Chryl Heck on Monday (and labs) and he is aware of those appointments as well.  Jesua is thanked for his time and participation in the [place] study. He is encouraged to call me directly with any questions or concerns and my direct call back number is provided.  [name]. Sharlett Iles, BSN, RN, CIC 03/08/2020 10:37 AM

## 2020-03-09 ENCOUNTER — Ambulatory Visit (HOSPITAL_COMMUNITY)
Admission: RE | Admit: 2020-03-09 | Discharge: 2020-03-09 | Disposition: A | Discharge status: Home / Self Care | Payer: 59 | Source: Ambulatory Visit | Attending: Hematology and Oncology | Admitting: Hematology and Oncology

## 2020-03-09 ENCOUNTER — Ambulatory Visit: Payer: 59

## 2020-03-09 ENCOUNTER — Other Ambulatory Visit: Payer: Self-pay

## 2020-03-09 DIAGNOSIS — C3411 Malignant neoplasm of upper lobe, right bronchus or lung: Secondary | ICD-10-CM | POA: Diagnosis not present

## 2020-03-09 LAB — PULMONARY FUNCTION TEST
DL/VA % pred: 84 %
DL/VA: 3.55 ml/min/mmHg/L
DLCO cor % pred: 60 %
DLCO cor: 15.96 ml/min/mmHg
DLCO unc % pred: 56 %
DLCO unc: 14.98 ml/min/mmHg
FEF 25-75 Post: 2.29 L/sec
FEF 25-75 Pre: 2.54 L/sec
FEF2575-%Change-Post: -10 %
FEF2575-%Pred-Post: 84 %
FEF2575-%Pred-Pre: 94 %
FEV1-%Change-Post: -1 %
FEV1-%Pred-Post: 90 %
FEV1-%Pred-Pre: 91 %
FEV1-Post: 2.65 L
FEV1-Pre: 2.69 L
FEV1FVC-%Change-Post: 0 %
FEV1FVC-%Pred-Pre: 99 %
FEV6-%Change-Post: 0 %
FEV6-%Pred-Post: 92 %
FEV6-%Pred-Pre: 93 %
FEV6-Post: 3.39 L
FEV6-Pre: 3.42 L
FEV6FVC-%Change-Post: 0 %
FEV6FVC-%Pred-Post: 103 %
FEV6FVC-%Pred-Pre: 104 %
FVC-%Change-Post: -2 %
FVC-%Pred-Post: 89 %
FVC-%Pred-Pre: 91 %
FVC-Post: 3.4 L
FVC-Pre: 3.47 L
Post FEV1/FVC ratio: 78 %
Post FEV6/FVC ratio: 100 %
Pre FEV1/FVC ratio: 78 %
Pre FEV6/FVC Ratio: 100 %
RV % pred: 109 %
RV: 2.47 L
TLC % pred: 87 %
TLC: 5.98 L

## 2020-03-09 MED ORDER — ALBUTEROL SULFATE (2.5 MG/3ML) 0.083% IN NEBU
2.5000 mg | INHALATION_SOLUTION | Freq: Once | RESPIRATORY_TRACT | Status: AC
  Administered 2020-03-09: 2.5 mg via RESPIRATORY_TRACT

## 2020-03-10 ENCOUNTER — Other Ambulatory Visit: Payer: Self-pay

## 2020-03-10 ENCOUNTER — Ambulatory Visit: Payer: 59

## 2020-03-10 DIAGNOSIS — C3411 Malignant neoplasm of upper lobe, right bronchus or lung: Secondary | ICD-10-CM

## 2020-03-11 ENCOUNTER — Telehealth: Payer: Self-pay | Admitting: Hematology and Oncology

## 2020-03-11 ENCOUNTER — Ambulatory Visit: Payer: 59

## 2020-03-11 NOTE — Telephone Encounter (Signed)
Scheduled appointment per 03/03 schedule message. Contacted patient, patient is aware.

## 2020-03-14 ENCOUNTER — Ambulatory Visit: Payer: 59

## 2020-03-14 ENCOUNTER — Inpatient Hospital Stay: Payer: 59

## 2020-03-14 ENCOUNTER — Encounter: Payer: Self-pay | Admitting: Hematology and Oncology

## 2020-03-14 ENCOUNTER — Other Ambulatory Visit: Payer: Self-pay

## 2020-03-14 ENCOUNTER — Inpatient Hospital Stay (HOSPITAL_BASED_OUTPATIENT_CLINIC_OR_DEPARTMENT_OTHER): Payer: 59 | Admitting: Hematology and Oncology

## 2020-03-14 VITALS — BP 132/87 | HR 80 | Temp 97.9°F | Resp 17 | Ht 69.0 in | Wt 183.2 lb

## 2020-03-14 DIAGNOSIS — Z510 Encounter for antineoplastic radiation therapy: Secondary | ICD-10-CM | POA: Diagnosis present

## 2020-03-14 DIAGNOSIS — Z5111 Encounter for antineoplastic chemotherapy: Secondary | ICD-10-CM | POA: Insufficient documentation

## 2020-03-14 DIAGNOSIS — C3411 Malignant neoplasm of upper lobe, right bronchus or lung: Secondary | ICD-10-CM

## 2020-03-14 DIAGNOSIS — Z006 Encounter for examination for normal comparison and control in clinical research program: Secondary | ICD-10-CM | POA: Insufficient documentation

## 2020-03-14 LAB — CMP (CANCER CENTER ONLY)
ALT: 13 U/L (ref 0–44)
AST: 16 U/L (ref 15–41)
Albumin: 3.3 g/dL — ABNORMAL LOW (ref 3.5–5.0)
Alkaline Phosphatase: 64 U/L (ref 38–126)
Anion gap: 6 (ref 5–15)
BUN: 11 mg/dL (ref 8–23)
CO2: 27 mmol/L (ref 22–32)
Calcium: 9.9 mg/dL (ref 8.9–10.3)
Chloride: 106 mmol/L (ref 98–111)
Creatinine: 1.09 mg/dL (ref 0.61–1.24)
GFR, Estimated: >60 mL/min (ref >60)
Glucose, Bld: 102 mg/dL — ABNORMAL HIGH (ref 70–99)
Potassium: 4.7 mmol/L (ref 3.5–5.1)
Sodium: 139 mmol/L (ref 135–145)
Total Bilirubin: 0.3 mg/dL (ref 0.3–1.2)
Total Protein: 7.4 g/dL (ref 6.5–8.1)

## 2020-03-14 LAB — CBC WITH DIFFERENTIAL/PLATELET
Abs Immature Granulocytes: 0.14 10*3/uL — ABNORMAL HIGH (ref 0.00–0.07)
Basophils Absolute: 0.1 10*3/uL (ref 0.0–0.1)
Basophils Relative: 1 %
Eosinophils Absolute: 0.1 10*3/uL (ref 0.0–0.5)
Eosinophils Relative: 1 %
HCT: 37 % — ABNORMAL LOW (ref 39.0–52.0)
Hemoglobin: 11.9 g/dL — ABNORMAL LOW (ref 13.0–17.0)
Immature Granulocytes: 1 %
Lymphocytes Relative: 30 %
Lymphs Abs: 3.4 10*3/uL (ref 0.7–4.0)
MCH: 26.6 pg (ref 26.0–34.0)
MCHC: 32.2 g/dL (ref 30.0–36.0)
MCV: 82.8 fL (ref 80.0–100.0)
Monocytes Absolute: 1.4 10*3/uL — ABNORMAL HIGH (ref 0.1–1.0)
Monocytes Relative: 13 %
Neutro Abs: 6 10*3/uL (ref 1.7–7.7)
Neutrophils Relative %: 54 %
Platelets: 415 10*3/uL — ABNORMAL HIGH (ref 150–400)
RBC: 4.47 MIL/uL (ref 4.22–5.81)
RDW: 15.9 % — ABNORMAL HIGH (ref 11.5–15.5)
WBC: 11 10*3/uL — ABNORMAL HIGH (ref 4.0–10.5)
nRBC: 0 % (ref 0.0–0.2)

## 2020-03-14 LAB — RESEARCH LABS

## 2020-03-14 NOTE — Research (Addendum)
[place] TESTING THE ADDITION OF ATEZOLIZUMAB TO THE USUAL CHEMORADIATION TREATMENT FOR LIMITED STAGE SMALL CELL LUNG CANCER  Ritchard arrives [place] for pretreatment labs and MD visit today.   LABS: Mandatory and optional labs are collected per consent and study protocol: Patient Adam [name] tolerates well without complaint.  VITAL SIGNS: Vital signs are collected per protocol.   MEDICATION REVIEW: Newel reviews the current med list and verifies the list as correct.  MD VISIT: Jams sees Dr. Chryl Heck today: he denies any complaints other than a minor cough since his first cycle (pre-registration cycle).   ADVERSE EVENTS: Adam [name] has a baseline history of hypertension, anemia and a minor cough, all present prior to the pre-registration cycle of treatment and none of which have increased in grade (all grade 1, CTCAE 5.0). Today's lab results reveal grade 1 hyperglycemia and grade 1 hypoalbuminemia, neither of which will interrupt planned treatment or are considered clinically significant. Per MD, treatment will continue as planned.  PROs: Following the provider visit, [name] is escorted to the research audit room to complete the required PROs. Questionnaires are reviewed for completeness and all questions have been answered.  SCHEDULE: Adam [name] is provided a copy of his current schedule and is thanked for his time and voluntary participation in this study.   Current plan is for Adam [name] to return to the cancer center tomorrow morning for on-study radation and cycle 1 infusion.  Adam [name]. Sharlett Iles, BSN, RN, CIC 03/14/2020 11:16 AM

## 2020-03-14 NOTE — Research (Incomplete Revision)
[place] TESTING THE ADDITION OF ATEZOLIZUMAB TO THE USUAL CHEMORADIATION TREATMENT FOR LIMITED STAGE SMALL CELL LUNG CANCER  Adam [name] arrives [name] for pretreatment labs and MD visit today.   LABS: Mandatory and optional labs are collected per consent and study protocol: Patient Adam [name] tolerates well without complaint.  VITAL SIGNS: Vital signs are collected per protocol.   MEDICATION REVIEW: Hilery reviews the current med list and verifies the list as correct.  MD VISIT: Adam [name] sees Dr. Chryl Heck today: he denies any complaints other than a minor cough since his first cycle (pre-registration cycle).   ADVERSE EVENT LOG:   Event Grade Onset Date End Date Status Comments Attribution to Cisplatin Attribution to Etoposide  Hypertension 1 Baseline   Ongoing Hx of HTN o Unrelated o Unlikely o Possible o Probable o Definite  o Unrelated o Unlikely o Possible o Probable o Definite   Anemia 1 Baseline   Ongoing Present prior to C1 - No intervention required o Unrelated o Unlikely o Possible o Probable o Definite  o Unrelated o Unlikely o Possible o Probable o Definite   Hyperglycemia 1 03/14/2020  Ongoing No intervention required o Unrelated o Unlikely o Possible o Probable o Definite  o Unrelated o Unlikely o Possible o Probable o Definite   Hypoalbuminemia 1 03/14/2020  Ongoing No intervention required o Unrelated o Unlikely o Possible o Probable o Definite  o Unrelated o Unlikely o Possible o Probable o Definite   Cough 1 Baseline  Ongoing No intervention required o Unrelated o Unlikely o Possible o Probable o Definite o  o Unrelated o Unlikely o Possible o Probable o Definite o     PROs: Following the provider visit, Adam [name] is escorted to the research audit room to complete the required PROs. Questionnaires are reviewed for completeness and all questions have been answered.  SCHEDULE: Adam [name] is provided a copy of his current schedule and is  thanked for his time and voluntary participation in this study.   Current plan is for Adam [name] to return to the cancer center tomorrow morning for on-study radation and cycle 1 infusion.  Adam [name]. Adam [name], BSN, RN, CIC 03/14/2020 11:16 AM

## 2020-03-14 NOTE — Progress Notes (Signed)
Adam [name] CONSULT NOTE  Patient Care Team: Patient, No Pcp Per as PCP - General (General Practice) [name], RN as Registered Nurse  CHIEF COMPLAINTS/PURPOSE OF CONSULTATION:  Limited stage small cell lung cancer.  ASSESSMENT & PLAN:  No problem-specific Assessment & Plan notes found for this encounter.  No orders of the defined types were placed in this encounter.  1. Limited stage small cell lung cancer Enrolled to the following study.  LIMITED STAGE SMALL CELL LUNG CANCER (LS-SCLC): A PHASE III  RANDOMIZED STUDY OF CHEMORADIATION VERSUS CHEMORADIATION PLUS ATEZOLIZUMAB (10-June-2019)  I have gone over the consent with the patient, discussed side effects of chemotherapy and immunotherapy.  Discussed adverse effects including but not limited to fatigue, nausea, vomiting, diarrhea, increased risk of infections, ototoxicity, nephropathy with chemotherapy.  Discussed adverse effects of chemotherapy including but not limited to fatigue, dry skin, hypo-/hyperthyroidism, nephritis, hepatitis, myocarditis, hypophysitis etc.  Discussed that immunotherapy can eventually affect any organ but most of the side effects are mild to moderate and treatable.  Given the histology and per trial protocol, since we were allowed to use chemotherapy for C1, we proceeded with cisplatin and etoposide. He completed this on 2.10.22. C2D1 to start tomorrow as planned per protocol.  He is here for FU, to start concurrent chemo radiation per protocol. PFT's are without any major concerns. He feels well, no hemoptysis. No chest pain, SOB Cough minimal. Appetite is good, no weight loss No change in bowel habits, urinary habits or neurological complaints Rest of the pertinent 10 point ROS reviewed and negative. PE unremarkable, no concerns Labs reviewed and ok to proceed.  Thank you for consulting [place] in the care of this patient.  Please not hesitate to contact us with any additional  questions or concerns.  HISTORY OF PRESENTING ILLNESS:  Adam [name] 65 y.o. male is here because of new diagnosis of limited stage small cell lung cancer.  Adam [name] 65 y.o. male is here because of hemoptysis  This is a 65 yr old male patient with PMH significant for HTN presented with chief complaint of hemoptysis. He had a dental extraction 3 months ago and soon after noticed scant hemoptysis which he attributed to the dental extraction. He then had two more episodes of scant hemoptysis, he describes it as pea size in his sputum. He may have noticed worsening SOB in the past 3 months but not significant. He lost about 10 lbs of weight in the past several months. He denies any chest pain or worsening cough. No other complaints. No change in bowel or urinary habits. He smoked 20 PPY, quit about 2 yrs ago. He worked as a Probation officer, [employer] manager.  CT chest 02/06/2020 showed right upper lobe mass lesion with associated large nodal mass involving the right suprahilar region and mediastinum in the paratracheal region consistent with primary pulmonary neoplasm and metastatic nodal involvement. CT abdomen pelvis from 02/07/2020 showed an 8 mm nonobstructing calculus in the left kidney, small umbilical hernia, bubbly mixed lytic and sclerotic lesion within the right femoral head of uncertain acuity could represent a small enchondroma, metastatic is considered less likely but not entirely excluded. MRI brain from 02/07/2020 showed single polypoid focus of susceptibility artifact and faint nodular enhancement within the central pons.  Findings favored to reflect a small vascular structure short interval follow-up in 25-month suggested to document stability.  No other MRI evidence of intracranial metastatic disease. Cytology from bronchoscopy showed malignant cells consistent with  small cell carcinoma in the 4R lymph node.  He is an excellent candidate for the following trial.  LIMITED STAGE  SMALL CELL LUNG CANCER (LS-SCLC): A PHASE III  RANDOMIZED STUDY OF CHEMORADIATION VERSUS CHEMORADIATION PLUS ATEZOLIZUMAB (10-June-2019)  C1 D1 02/16/2020-02/18/2020 He has consented for the trial and is enrolled to the arm without immunotherapy.  He is doing really well and is here for pre chemo evaluation. He has not had any more episodes of hemoptysis since his hospital visit.   No worsening cough, shortness of breath with exertion. No change in bowel habits. No change in urinary habits. No new neurological complaints.  Rest of the pertinent 10 point ROS reviewed and negative.  REVIEW OF SYSTEMS:    Constitutional: Denies fevers, chills or abnormal night sweats Eyes: Denies blurriness of vision, double vision or watery eyes Ears, nose, mouth, throat, and face: Denies mucositis or sore throat Respiratory: Denies cough, dyspnea or wheezes Cardiovascular: Denies palpitation, chest discomfort or lower extremity swelling Gastrointestinal:  Denies nausea, heartburn or change in bowel habits Skin: Denies abnormal skin rashes Lymphatics: Denies new lymphadenopathy or easy bruising Neurological:Denies numbness, tingling or new weaknesses Behavioral/Psych: Mood is stable, no new changes  All other systems were reviewed with the patient and are negative.  MEDICAL HISTORY:  Past Medical History:  Diagnosis Date  . Hypertension   . Lung cancer ([place])    small cell RUL    SURGICAL HISTORY: Past Surgical History:  Procedure Laterality Date  . BRONCHIAL BRUSHINGS  02/08/2020   Procedure: BRONCHIAL BRUSHINGS;  Surgeon: Freddi Starr, MD;  Location: [facility];  Service: Pulmonary;;  . BRONCHIAL NEEDLE ASPIRATION BIOPSY  02/08/2020   Procedure: BRONCHIAL NEEDLE ASPIRATION BIOPSIES;  Surgeon: Freddi Starr, MD;  Location: [facility];  Service: Pulmonary;;  . KNEE SURGERY Right   . SHOULDER SURGERY Left   . VIDEO BRONCHOSCOPY WITH ENDOBRONCHIAL ULTRASOUND N/A 02/08/2020    Procedure: VIDEO BRONCHOSCOPY WITH ENDOBRONCHIAL ULTRASOUND;  Surgeon: Freddi Starr, MD;  Location: [facility];  Service: Pulmonary;  Laterality: N/A;    SOCIAL HISTORY: Social History   Socioeconomic History  . Marital status: Single    Spouse name: [name]  . Number of children: Not on file  . Years of education: Not on file  . Highest education level: Not on file  Occupational History  . Not on file  Tobacco Use  . Smoking status: Former Smoker    Packs/day: 1.00    Years: 20.00    Pack years: 20.00    Types: Cigarettes    Quit date: 06/09/2018    Years since quitting: 1.7  . Smokeless tobacco: Never Used  Vaping Use  . Vaping Use: Never used  Substance and Sexual Activity  . Alcohol use: Not Currently  . Drug use: Never  . Sexual activity: Yes  Other Topics Concern  . Not on file  Social History Narrative  . Not on file   Social Determinants of Health   Financial Resource Strain: Not on file  Food Insecurity: Not on file  Transportation Needs: Not on file  Physical Activity: Not on file  Stress: Not on file  Social Connections: Not on file  Intimate Partner Violence: Not on file    FAMILY HISTORY: Family History  Problem Relation Age of Onset  . Heart disease Mother   . Cancer Cousin   . Breast cancer Neg Hx   . Colon cancer Neg Hx   . Pancreatic cancer Neg Hx   .  Prostate cancer Neg Hx     ALLERGIES:  has No Known Allergies.  MEDICATIONS:  Current Outpatient Medications  Medication Sig Dispense Refill  . amLODipine (NORVASC) 10 MG tablet Take 10 mg by mouth daily.    Marland Kitchen ibuprofen (ADVIL) 600 MG tablet Take 600 mg by mouth every 6 (six) hours as needed. (Patient not taking: No sig reported)    . lisinopril-hydrochlorothiazide (ZESTORETIC) 20-12.5 MG tablet Take 1 tablet by mouth daily.     No current facility-administered medications for this visit.    PHYSICAL EXAMINATION:  ECOG PERFORMANCE STATUS: 0 - Asymptomatic  Vitals:   03/14/20  0954  BP: 132/87  Pulse: 80  Resp: 17  Temp: 97.9 F (36.6 C)  SpO2: 100%   Filed Weights   03/14/20 0954  Weight: 183 lb 3.2 oz (83.1 kg)    GENERAL:alert, no distress and comfortable SKIN: skin color, texture, turgor are normal, no rashes or significant lesions EYES: normal, conjunctiva are pink and non-injected, sclera clear OROPHARYNX:no exudate, no erythema and lips, buccal mucosa, and tongue normal  NECK: supple, thyroid normal size, non-tender, without nodularity LYMPH:  no palpable lymphadenopathy in the cervical, axillary or inguinal LUNGS: clear to auscultation and percussion with normal breathing effort HEART: regular rate & rhythm and no murmurs and no lower extremity edema ABDOMEN:abdomen soft, non-tender and normal bowel sounds Musculoskeletal:no cyanosis of digits and no clubbing  PSYCH: alert & oriented x 3 with fluent speech NEURO: no focal motor/sensory deficits  LABORATORY DATA:  I have reviewed the data as listed Lab Results  Component Value Date   WBC 11.0 (H) 03/14/2020   HGB 11.9 (L) 03/14/2020   HCT 37.0 (L) 03/14/2020   MCV 82.8 03/14/2020   PLT 415 (H) 03/14/2020     Chemistry      Component Value Date/Time   NA 139 03/14/2020 0923   K 4.7 03/14/2020 0923   CL 106 03/14/2020 0923   CO2 27 03/14/2020 0923   BUN 11 03/14/2020 0923   CREATININE 1.09 03/14/2020 0923      Component Value Date/Time   CALCIUM 9.9 03/14/2020 0923   ALKPHOS 64 03/14/2020 0923   AST 16 03/14/2020 0923   ALT 13 03/14/2020 0923   BILITOT 0.3 03/14/2020 0923       RADIOGRAPHIC STUDIES: I have personally reviewed the radiological images as listed and agreed with the findings in the report. No results found.  All questions were answered. The patient knows to call the clinic with any problems, questions or concerns. I spent 30 minutes in the care of this patient including H and P, review of records, counseling and coordination of care.     Benay Pike,  MD 03/14/2020 10:24 AM

## 2020-03-14 NOTE — Research (Signed)
DCP-001 USE OF A CLINICAL TRIAL SCREENING TOOL TO ADDRESS CANCER HEALTH DISPARITIES IN THE [employer] COMMUNITY ONCOLOGY RESEARCH PROGRAM (NCORP)   Following completion of [id number]-LU005 requirements, voluntary participation in DCP-001 is presented.  Patient Adam [name] was identified as a potential candidate for the DCP-001 study since he is eligible for participation in [place].  This Clinical Research Nurse along with clinical research nurse Ruben Im met with Adam [name], [id number], on 03/14/20 in a manner and location that ensures patient privacy to discuss participation in the above listed research study.  Patient is Unaccompanied.  A copy of the informed consent document and separate HIPAA Authorization was provided to the patient.  Patient reads, speaks, and understands [place].    Patient was provided with the business card of this Nurse and encouraged to contact the research team with any questions.  Patient was provided the option of taking informed consent documents home to review and was encouraged to review at their convenience with their support network, including other care providers. Patient is comfortable with making a decision regarding study participation today.  As outlined in the informed consent form, this Nurse and Adam [name] discussed the purpose of the research study, the investigational nature of the study, study procedures and requirements for study participation, potential risks and benefits of study participation, as well as alternatives to participation. This study is not blinded or double-blinded. The patient understands participation is voluntary and they may withdraw from study participation at any time.  This study does not involve randomization.  This study does not involve an investigational drug or device. This study does not involve a placebo. Patient is eligible for participation since he is eligible for participation in [place]. Adam [name] verbalizes understanding that  this is a one-time data collection study.   Confidentiality and how the patient's information will be used as part of study participation were discussed.  Patient was informed there is not reimbursement provided for their time and effort spent on trial participation.  The patient is encouraged to discuss research study participation with their insurance provider to determine what costs they may incur as part of study participation, including research related injury.    All questions were answered to patient's satisfaction.  The informed consent and separate HIPAA Authorization was reviewed page by page.  The patient's mental and emotional status is appropriate to provide informed consent, and the patient verbalizes an understanding of study participation.  Patient has agreed to participate in the above listed research study and has voluntarily signed the informed consent version and separate HIPAA Authorization, version 02/01/2020 on 03/14/20 at 10:47 AM.  The patient was provided with a copy of the signed informed consent form and separate HIPAA Authorization for their reference.  No study specific procedures were obtained prior to the signing of the informed consent document.  Approximately 30 minutes were spent with the patient reviewing the informed consent documents.  Patient was not requested to complete a Release of Information form.  DCP-001 WORKSHEET: After informed consent was obtained, the DCP-001 Worksheet was completed to collect ethnicity, language, literacy, education, employment, income and smoking history not available in the electronic medical record.     The patient was thanked again for his time and willingness to participate in these studyies. A business card with my direct contact information was provided should any questions or concerns arise. Current plan is to see Adam [name] tomorrow for radiation and infusion.  [name]. Sharlett Iles, BSN, RN, CIC 03/14/2020 11:20 AM

## 2020-03-15 ENCOUNTER — Ambulatory Visit: Payer: 59 | Admitting: Hematology and Oncology

## 2020-03-15 ENCOUNTER — Ambulatory Visit
Admission: RE | Admit: 2020-03-15 | Discharge: 2020-03-15 | Disposition: A | Discharge status: Home / Self Care | Payer: 59 | Source: Ambulatory Visit | Attending: Radiation Oncology | Admitting: Radiation Oncology

## 2020-03-15 ENCOUNTER — Other Ambulatory Visit: Payer: Self-pay

## 2020-03-15 ENCOUNTER — Inpatient Hospital Stay: Payer: 59

## 2020-03-15 DIAGNOSIS — Z510 Encounter for antineoplastic radiation therapy: Secondary | ICD-10-CM | POA: Diagnosis not present

## 2020-03-15 DIAGNOSIS — C3411 Malignant neoplasm of upper lobe, right bronchus or lung: Secondary | ICD-10-CM

## 2020-03-15 DIAGNOSIS — Z5111 Encounter for antineoplastic chemotherapy: Secondary | ICD-10-CM | POA: Insufficient documentation

## 2020-03-15 MED ORDER — SODIUM CHLORIDE 0.9 % IV SOLN
60.0000 mg/m2 | Freq: Once | INTRAVENOUS | Status: AC
  Administered 2020-03-15: 120 mg via INTRAVENOUS
  Filled 2020-03-15: qty 120

## 2020-03-15 MED ORDER — HEPARIN SOD (PORK) LOCK FLUSH 100 UNIT/ML IV SOLN
500.0000 [IU] | Freq: Once | INTRAVENOUS | Status: DC | PRN
  Filled 2020-03-15: qty 5

## 2020-03-15 MED ORDER — MAGNESIUM SULFATE 2 GM/50ML IV SOLN
2.0000 g | Freq: Once | INTRAVENOUS | Status: AC
  Administered 2020-03-15: 2 g via INTRAVENOUS

## 2020-03-15 MED ORDER — SODIUM CHLORIDE 0.9 % IV SOLN
Freq: Once | INTRAVENOUS | Status: AC
  Filled 2020-03-15: qty 250

## 2020-03-15 MED ORDER — MAGNESIUM SULFATE 2 GM/50ML IV SOLN
INTRAVENOUS | Status: AC
  Filled 2020-03-15: qty 50

## 2020-03-15 MED ORDER — SODIUM CHLORIDE 0.9 % IV SOLN
Freq: Once | INTRAVENOUS | Status: DC
  Filled 2020-03-15: qty 10

## 2020-03-15 MED ORDER — SODIUM CHLORIDE 0.9% FLUSH
3.0000 mL | INTRAVENOUS | Status: DC | PRN
  Filled 2020-03-15: qty 10

## 2020-03-15 MED ORDER — HOT PACK MISC ONCOLOGY
1.0000 | Freq: Once | Status: AC | PRN
  Filled 2020-03-15: qty 1

## 2020-03-15 MED ORDER — PALONOSETRON HCL INJECTION 0.25 MG/5ML
0.2500 mg | Freq: Once | INTRAVENOUS | Status: AC
  Administered 2020-03-15: 0.25 mg via INTRAVENOUS

## 2020-03-15 MED ORDER — ALTEPLASE 2 MG IJ SOLR
2.0000 mg | Freq: Once | INTRAMUSCULAR | Status: DC | PRN
  Filled 2020-03-15: qty 2

## 2020-03-15 MED ORDER — SODIUM CHLORIDE 0.9 % IV SOLN
150.0000 mg | Freq: Once | INTRAVENOUS | Status: AC
  Administered 2020-03-15: 150 mg via INTRAVENOUS
  Filled 2020-03-15: qty 150

## 2020-03-15 MED ORDER — HEPARIN SOD (PORK) LOCK FLUSH 100 UNIT/ML IV SOLN
250.0000 [IU] | Freq: Once | INTRAVENOUS | Status: DC | PRN
Start: 2020-03-15 — End: 2020-03-16
  Filled 2020-03-15: qty 5

## 2020-03-15 MED ORDER — PALONOSETRON HCL INJECTION 0.25 MG/5ML
INTRAVENOUS | Status: AC
  Filled 2020-03-15: qty 5

## 2020-03-15 MED ORDER — SODIUM CHLORIDE 0.9 % IV SOLN
10.0000 mg | Freq: Once | INTRAVENOUS | Status: AC
  Administered 2020-03-15: 10 mg via INTRAVENOUS
  Filled 2020-03-15: qty 10

## 2020-03-15 MED ORDER — SODIUM CHLORIDE 0.9 % IV SOLN
120.0000 mg/m2 | Freq: Once | INTRAVENOUS | Status: AC
  Administered 2020-03-15: 240 mg via INTRAVENOUS
  Filled 2020-03-15: qty 12

## 2020-03-15 MED ORDER — SODIUM CHLORIDE 0.9% FLUSH
10.0000 mL | INTRAVENOUS | Status: DC | PRN
  Filled 2020-03-15: qty 10

## 2020-03-15 MED ORDER — POTASSIUM CHLORIDE IN NACL 20-0.9 MEQ/L-% IV SOLN
Freq: Once | INTRAVENOUS | Status: AC
  Filled 2020-03-15: qty 1000

## 2020-03-15 NOTE — Patient Instructions (Signed)
[facility] Discharge Instructions for Patients Receiving Chemotherapy  Today you received the following chemotherapy agents Etoposide, Cisplatin.  To help prevent nausea and vomiting after your treatment, we encourage you to take your nausea medication as directed.   If you develop nausea and vomiting that is not controlled by your nausea medication, call the clinic.   BELOW ARE SYMPTOMS THAT SHOULD BE REPORTED IMMEDIATELY:  *FEVER GREATER THAN 100.5 F  *CHILLS WITH OR WITHOUT FEVER  NAUSEA AND VOMITING THAT IS NOT CONTROLLED WITH YOUR NAUSEA MEDICATION  *UNUSUAL SHORTNESS OF BREATH  *UNUSUAL BRUISING OR BLEEDING  TENDERNESS IN MOUTH AND THROAT WITH OR WITHOUT PRESENCE OF ULCERS  *URINARY PROBLEMS  *BOWEL PROBLEMS  UNUSUAL RASH Items with * indicate a potential emergency and should be followed up as soon as possible.  Feel free to call the clinic should you have any questions or concerns. The clinic phone number is [phone].  Please show the [place] at check-in to the Emergency Department and triage nurse.

## 2020-03-16 ENCOUNTER — Inpatient Hospital Stay (HOSPITAL_BASED_OUTPATIENT_CLINIC_OR_DEPARTMENT_OTHER): Payer: 59

## 2020-03-16 ENCOUNTER — Telehealth: Payer: Self-pay | Admitting: Hematology and Oncology

## 2020-03-16 ENCOUNTER — Other Ambulatory Visit: Payer: Self-pay

## 2020-03-16 ENCOUNTER — Ambulatory Visit
Admission: RE | Admit: 2020-03-16 | Discharge: 2020-03-16 | Disposition: A | Discharge status: Home / Self Care | Payer: 59 | Source: Ambulatory Visit | Attending: Radiation Oncology | Admitting: Radiation Oncology

## 2020-03-16 VITALS — BP 142/81 | HR 92 | Temp 98.0°F | Resp 18

## 2020-03-16 DIAGNOSIS — C3411 Malignant neoplasm of upper lobe, right bronchus or lung: Secondary | ICD-10-CM

## 2020-03-16 DIAGNOSIS — Z510 Encounter for antineoplastic radiation therapy: Secondary | ICD-10-CM | POA: Diagnosis not present

## 2020-03-16 MED ORDER — SODIUM CHLORIDE 0.9 % IV SOLN
10.0000 mg | Freq: Once | INTRAVENOUS | Status: DC
  Filled 2020-03-16: qty 1

## 2020-03-16 MED ORDER — SODIUM CHLORIDE 0.9% FLUSH
3.0000 mL | INTRAVENOUS | Status: DC | PRN
  Filled 2020-03-16: qty 10

## 2020-03-16 MED ORDER — HOT PACK MISC ONCOLOGY
1.0000 | Freq: Once | Status: AC | PRN
  Filled 2020-03-16: qty 1

## 2020-03-16 MED ORDER — ALTEPLASE 2 MG IJ SOLR
2.0000 mg | Freq: Once | INTRAMUSCULAR | Status: DC | PRN
  Filled 2020-03-16: qty 2

## 2020-03-16 MED ORDER — HEPARIN SOD (PORK) LOCK FLUSH 100 UNIT/ML IV SOLN
500.0000 [IU] | Freq: Once | INTRAVENOUS | Status: DC | PRN
  Filled 2020-03-16: qty 5

## 2020-03-16 MED ORDER — SODIUM CHLORIDE 0.9 % IV SOLN
Freq: Once | INTRAVENOUS | Status: DC
  Filled 2020-03-16: qty 250

## 2020-03-16 MED ORDER — SODIUM CHLORIDE 0.9 % IV SOLN
Freq: Once | INTRAVENOUS | Status: AC
  Filled 2020-03-16: qty 250

## 2020-03-16 MED ORDER — SODIUM CHLORIDE 0.9% FLUSH
10.0000 mL | INTRAVENOUS | Status: DC | PRN
  Filled 2020-03-16: qty 10

## 2020-03-16 MED ORDER — HEPARIN SOD (PORK) LOCK FLUSH 100 UNIT/ML IV SOLN
250.0000 [IU] | Freq: Once | INTRAVENOUS | Status: DC | PRN
  Filled 2020-03-16: qty 5

## 2020-03-16 MED ORDER — SODIUM CHLORIDE 0.9% FLUSH
10.0000 mL | INTRAVENOUS | Status: DC | PRN
Start: 2020-03-16 — End: 2020-03-16
  Filled 2020-03-16: qty 10

## 2020-03-16 MED ORDER — SODIUM CHLORIDE 0.9 % IV SOLN
10.0000 mg | Freq: Once | INTRAVENOUS | Status: AC
  Administered 2020-03-16: 10 mg via INTRAVENOUS
  Filled 2020-03-16: qty 10

## 2020-03-16 MED ORDER — SODIUM CHLORIDE 0.9 % IV SOLN
120.0000 mg/m2 | Freq: Once | INTRAVENOUS | Status: AC
  Administered 2020-03-16: 240 mg via INTRAVENOUS
  Filled 2020-03-16: qty 12

## 2020-03-16 MED ORDER — SODIUM CHLORIDE 0.9 % IV SOLN
120.0000 mg/m2 | Freq: Once | INTRAVENOUS | Status: DC
  Filled 2020-03-16 (×2): qty 12

## 2020-03-16 NOTE — Patient Instructions (Signed)
[facility] Discharge Instructions for Patients Receiving Chemotherapy  Today you received the following chemotherapy agents: etoposide  To help prevent nausea and vomiting after your treatment, we encourage you to take your nausea medication as directed.   If you develop nausea and vomiting that is not controlled by your nausea medication, call the clinic.   BELOW ARE SYMPTOMS THAT SHOULD BE REPORTED IMMEDIATELY:  *FEVER GREATER THAN 100.5 F  *CHILLS WITH OR WITHOUT FEVER  NAUSEA AND VOMITING THAT IS NOT CONTROLLED WITH YOUR NAUSEA MEDICATION  *UNUSUAL SHORTNESS OF BREATH  *UNUSUAL BRUISING OR BLEEDING  TENDERNESS IN MOUTH AND THROAT WITH OR WITHOUT PRESENCE OF ULCERS  *URINARY PROBLEMS  *BOWEL PROBLEMS  UNUSUAL RASH Items with * indicate a potential emergency and should be followed up as soon as possible.  Feel free to call the clinic should you have any questions or concerns. The clinic phone number is [phone].  Please show the CHEMO ALERT CARD at check-in to the Emergency Department and triage nurse.   

## 2020-03-16 NOTE — Telephone Encounter (Signed)
Scheduled appointments per 03/09 schedule message. Gave patient updated calender.

## 2020-03-17 ENCOUNTER — Inpatient Hospital Stay: Payer: 59

## 2020-03-17 ENCOUNTER — Ambulatory Visit
Admission: RE | Admit: 2020-03-17 | Discharge: 2020-03-17 | Disposition: A | Discharge status: Home / Self Care | Payer: 59 | Source: Ambulatory Visit | Attending: Radiation Oncology | Admitting: Radiation Oncology

## 2020-03-17 ENCOUNTER — Telehealth: Payer: Self-pay | Admitting: Hematology and Oncology

## 2020-03-17 ENCOUNTER — Other Ambulatory Visit: Payer: Self-pay

## 2020-03-17 VITALS — BP 143/86 | HR 91 | Temp 97.8°F | Resp 20 | Wt 182.5 lb

## 2020-03-17 DIAGNOSIS — Z510 Encounter for antineoplastic radiation therapy: Secondary | ICD-10-CM | POA: Diagnosis not present

## 2020-03-17 DIAGNOSIS — C3411 Malignant neoplasm of upper lobe, right bronchus or lung: Secondary | ICD-10-CM

## 2020-03-17 MED ORDER — HEPARIN SOD (PORK) LOCK FLUSH 100 UNIT/ML IV SOLN
500.0000 [IU] | Freq: Once | INTRAVENOUS | Status: AC | PRN
  Administered 2020-03-17: 500 [IU]
  Filled 2020-03-17: qty 5

## 2020-03-17 MED ORDER — ETOPOSIDE CHEMO INJECTION 1 GM/50ML
120.0000 mg/m2 | Freq: Once | INTRAVENOUS | Status: AC
  Administered 2020-03-17: 240 mg via INTRAVENOUS
  Filled 2020-03-17: qty 12

## 2020-03-17 MED ORDER — ALTEPLASE 2 MG IJ SOLR
2.0000 mg | Freq: Once | INTRAMUSCULAR | Status: DC | PRN
  Filled 2020-03-17: qty 2

## 2020-03-17 MED ORDER — HEPARIN SOD (PORK) LOCK FLUSH 100 UNIT/ML IV SOLN
250.0000 [IU] | Freq: Once | INTRAVENOUS | Status: DC | PRN
  Filled 2020-03-17: qty 5

## 2020-03-17 MED ORDER — SODIUM CHLORIDE 0.9 % IV SOLN
10.0000 mg | Freq: Once | INTRAVENOUS | Status: AC
  Administered 2020-03-17: 10 mg via INTRAVENOUS
  Filled 2020-03-17: qty 10

## 2020-03-17 MED ORDER — SODIUM CHLORIDE 0.9 % IV SOLN
Freq: Once | INTRAVENOUS | Status: AC
  Filled 2020-03-17: qty 250

## 2020-03-17 MED ORDER — HOT PACK MISC ONCOLOGY
1.0000 | Freq: Once | Status: DC | PRN
  Filled 2020-03-17: qty 1

## 2020-03-17 MED ORDER — SODIUM CHLORIDE 0.9% FLUSH
3.0000 mL | INTRAVENOUS | Status: DC | PRN
  Filled 2020-03-17: qty 10

## 2020-03-17 MED ORDER — SODIUM CHLORIDE 0.9% FLUSH
10.0000 mL | INTRAVENOUS | Status: DC | PRN
  Administered 2020-03-17: 10 mL
  Filled 2020-03-17: qty 10

## 2020-03-17 NOTE — Telephone Encounter (Signed)
Scheduled appt per 3/10 sch msg. Pt aware.

## 2020-03-17 NOTE — Patient Instructions (Signed)
[facility] Discharge Instructions for Patients Receiving Chemotherapy  Today you received the following chemotherapy agent: Etoposide  To help prevent nausea and vomiting after your treatment, we encourage you to take your nausea medication as directed by your MD.   If you develop nausea and vomiting that is not controlled by your nausea medication, call the clinic.   BELOW ARE SYMPTOMS THAT SHOULD BE REPORTED IMMEDIATELY:  *FEVER GREATER THAN 100.5 F  *CHILLS WITH OR WITHOUT FEVER  NAUSEA AND VOMITING THAT IS NOT CONTROLLED WITH YOUR NAUSEA MEDICATION  *UNUSUAL SHORTNESS OF BREATH  *UNUSUAL BRUISING OR BLEEDING  TENDERNESS IN MOUTH AND THROAT WITH OR WITHOUT PRESENCE OF ULCERS  *URINARY PROBLEMS  *BOWEL PROBLEMS  UNUSUAL RASH Items with * indicate a potential emergency and should be followed up as soon as possible.  Feel free to call the clinic should you have any questions or concerns. The clinic phone number is [phone].  Please show the CHEMO ALERT CARD at check-in to the Emergency Department and triage nurse.   

## 2020-03-18 ENCOUNTER — Ambulatory Visit
Admission: RE | Admit: 2020-03-18 | Discharge: 2020-03-18 | Disposition: A | Discharge status: Home / Self Care | Payer: 59 | Source: Ambulatory Visit | Attending: Radiation Oncology | Admitting: Radiation Oncology

## 2020-03-18 ENCOUNTER — Other Ambulatory Visit: Payer: Self-pay

## 2020-03-18 DIAGNOSIS — C3411 Malignant neoplasm of upper lobe, right bronchus or lung: Secondary | ICD-10-CM

## 2020-03-18 DIAGNOSIS — Z510 Encounter for antineoplastic radiation therapy: Secondary | ICD-10-CM | POA: Diagnosis not present

## 2020-03-18 MED ORDER — SONAFINE EX EMUL
1.0000 "application " | Freq: Two times a day (BID) | CUTANEOUS | Status: DC
  Administered 2020-03-18: 1 via TOPICAL

## 2020-03-18 NOTE — Progress Notes (Signed)
Pt here for patient teaching.  Pt given Radiation and You booklet, skin care instructions and Sonafine.  Reviewed areas of pertinence such as fatigue, hair loss, skin changes, throat changes, cough and shortness of breath . Pt able to give teach back of to pat skin and use unscented/gentle soap,apply Sonafine bid, avoid applying anything to skin within 4 hours of treatment and to use an electric razor if they must shave. Pt demonstrated understanding of information given and will contact nursing with any questions or concerns.     [email or web address]

## 2020-03-21 ENCOUNTER — Other Ambulatory Visit: Payer: Self-pay

## 2020-03-21 ENCOUNTER — Ambulatory Visit
Admission: RE | Admit: 2020-03-21 | Discharge: 2020-03-21 | Disposition: A | Discharge status: Home / Self Care | Payer: 59 | Source: Ambulatory Visit | Attending: Radiation Oncology | Admitting: Radiation Oncology

## 2020-03-21 DIAGNOSIS — Z510 Encounter for antineoplastic radiation therapy: Secondary | ICD-10-CM | POA: Diagnosis not present

## 2020-03-22 ENCOUNTER — Inpatient Hospital Stay (HOSPITAL_BASED_OUTPATIENT_CLINIC_OR_DEPARTMENT_OTHER): Payer: 59 | Admitting: Hematology and Oncology

## 2020-03-22 ENCOUNTER — Other Ambulatory Visit: Payer: Self-pay

## 2020-03-22 ENCOUNTER — Encounter: Payer: Self-pay | Admitting: Hematology and Oncology

## 2020-03-22 ENCOUNTER — Inpatient Hospital Stay: Payer: 59

## 2020-03-22 ENCOUNTER — Ambulatory Visit
Admission: RE | Admit: 2020-03-22 | Discharge: 2020-03-22 | Disposition: A | Discharge status: Home / Self Care | Payer: 59 | Source: Ambulatory Visit | Attending: Radiation Oncology | Admitting: Radiation Oncology

## 2020-03-22 VITALS — BP 135/84 | HR 87 | Temp 97.6°F | Resp 14 | Ht 69.0 in | Wt 181.6 lb

## 2020-03-22 DIAGNOSIS — Z510 Encounter for antineoplastic radiation therapy: Secondary | ICD-10-CM | POA: Diagnosis not present

## 2020-03-22 DIAGNOSIS — Z006 Encounter for examination for normal comparison and control in clinical research program: Secondary | ICD-10-CM

## 2020-03-22 DIAGNOSIS — C3411 Malignant neoplasm of upper lobe, right bronchus or lung: Secondary | ICD-10-CM

## 2020-03-22 LAB — CBC WITH DIFFERENTIAL/PLATELET
Abs Immature Granulocytes: 0.06 10*3/uL (ref 0.00–0.07)
Basophils Absolute: 0.1 10*3/uL (ref 0.0–0.1)
Basophils Relative: 1 %
Eosinophils Absolute: 0.2 10*3/uL (ref 0.0–0.5)
Eosinophils Relative: 3 %
HCT: 33.8 % — ABNORMAL LOW (ref 39.0–52.0)
Hemoglobin: 11 g/dL — ABNORMAL LOW (ref 13.0–17.0)
Immature Granulocytes: 1 %
Lymphocytes Relative: 21 %
Lymphs Abs: 1 10*3/uL (ref 0.7–4.0)
MCH: 26.6 pg (ref 26.0–34.0)
MCHC: 32.5 g/dL (ref 30.0–36.0)
MCV: 81.8 fL (ref 80.0–100.0)
Monocytes Absolute: 0.1 10*3/uL (ref 0.1–1.0)
Monocytes Relative: 1 %
Neutro Abs: 3.6 10*3/uL (ref 1.7–7.7)
Neutrophils Relative %: 73 %
Platelets: 246 10*3/uL (ref 150–400)
RBC: 4.13 MIL/uL — ABNORMAL LOW (ref 4.22–5.81)
RDW: 15.7 % — ABNORMAL HIGH (ref 11.5–15.5)
WBC: 5 10*3/uL (ref 4.0–10.5)
nRBC: 0 % (ref 0.0–0.2)

## 2020-03-22 LAB — CMP (CANCER CENTER ONLY)
ALT: 13 U/L (ref 0–44)
AST: 11 U/L — ABNORMAL LOW (ref 15–41)
Albumin: 3.1 g/dL — ABNORMAL LOW (ref 3.5–5.0)
Alkaline Phosphatase: 65 U/L (ref 38–126)
Anion gap: 8 (ref 5–15)
BUN: 14 mg/dL (ref 8–23)
CO2: 28 mmol/L (ref 22–32)
Calcium: 9.2 mg/dL (ref 8.9–10.3)
Chloride: 102 mmol/L (ref 98–111)
Creatinine: 0.83 mg/dL (ref 0.61–1.24)
GFR, Estimated: >60 mL/min (ref >60)
Glucose, Bld: 96 mg/dL (ref 70–99)
Potassium: 3.5 mmol/L (ref 3.5–5.1)
Sodium: 138 mmol/L (ref 135–145)
Total Bilirubin: 0.7 mg/dL (ref 0.3–1.2)
Total Protein: 7.3 g/dL (ref 6.5–8.1)

## 2020-03-22 NOTE — Progress Notes (Signed)
[place] CONSULT NOTE  Patient Care Team: Patient, No Pcp Per as PCP - General (General Practice) Adam Kaufman, RN as Registered Nurse  CHIEF COMPLAINTS/PURPOSE OF CONSULTATION:  Limited stage small cell lung cancer.  ASSESSMENT & PLAN:  No problem-specific Assessment & Plan notes found for this encounter.  No orders of the defined types were placed in this encounter.  1. Limited stage small cell lung cancer Enrolled to the following study.  LIMITED STAGE SMALL CELL LUNG CANCER (LS-SCLC): A PHASE III  RANDOMIZED STUDY OF CHEMORADIATION VERSUS CHEMORADIATION PLUS ATEZOLIZUMAB (10-June-2019)  C1D1 cis/etoposide completed on [place] 2.10.22. C2D1 completed on 03/15/2020 through 03/17/2020  He is here for follow-up per research protocol.  Since his last visit, he may have been feeling slightly tired otherwise denies any complaints.  No changes in breathing, no more episodes of hemoptysis. No change in bowel habits or diarrhea. He denies any worsening shortness of breath, chest pain, chest pressure, or difficulty swallowing.  He is able to eat and drink well. Physical examination today unremarkable, no concerns. His labs have been reviewed, concerns. At this time we will continue to follow him weekly per clinical trial protocol.  He does not appear to have any toxicity from the concurrent chemoradiation at this time. No concern for pneumonitis, pericarditis, esophagitis, encephalitis.  Thank you for consulting [place] in the care of this patient.  Please not hesitate to contact us with any additional questions or concerns.  HISTORY OF PRESENTING ILLNESS:  Adam [name] 65 y.o. male is here because of new diagnosis of limited stage small cell lung cancer.  Adam [name] 65 y.o. male is here because of hemoptysis  This is a 65 yr old male patient with PMH significant for HTN presented with chief complaint of hemoptysis. He had a dental extraction 3 months ago and soon  after noticed scant hemoptysis which he attributed to the dental extraction. He then had two more episodes of scant hemoptysis, he describes it as pea size in his sputum. He may have noticed worsening SOB in the past 3 months but not significant. He lost about 10 lbs of weight in the past several months. He denies any chest pain or worsening cough. No other complaints. No change in bowel or urinary habits. He smoked 20 PPY, quit about 2 yrs ago. He worked as a Probation officer, Theme park manager.  CT chest 02/06/2020 showed right upper lobe mass lesion with associated large nodal mass involving the right suprahilar region and mediastinum in the paratracheal region consistent with primary pulmonary neoplasm and metastatic nodal involvement. CT abdomen pelvis from 02/07/2020 showed an 8 mm nonobstructing calculus in the left kidney, small umbilical hernia, bubbly mixed lytic and sclerotic lesion within the right femoral head of uncertain acuity could represent a small enchondroma, metastatic is considered less likely but not entirely excluded. MRI brain from 02/07/2020 showed single polypoid focus of susceptibility artifact and faint nodular enhancement within the central pons.  Findings favored to reflect a small vascular structure short interval follow-up in 57-month suggested to document stability.  No other MRI evidence of intracranial metastatic disease. Cytology from bronchoscopy showed malignant cells consistent with small cell carcinoma in the 4R lymph node.  He is an excellent candidate for the following trial.  LIMITED STAGE SMALL CELL LUNG CANCER (LS-SCLC): A PHASE III  RANDOMIZED STUDY OF CHEMORADIATION VERSUS CHEMORADIATION PLUS ATEZOLIZUMAB (10-June-2019)  C1 D1 02/16/2020-02/18/2020 He has consented for the trial and is enrolled to the  arm without immunotherapy.  Interim History  [name] is here for follow-up himself. Since his last visit, he may have felt slightly tired but no major  concerns.  No cough, worsening shortness of breath, hemoptysis, chest pain or chest pressure. No nausea, vomiting, diarrhea. No new headaches, change in balance, strength, sensation. He is able to swallow food just fine and has been maintaining his weight. No tingling or numbness from cisplatin.  He may have had a couple episodes of tinnitus which resolved spontaneously but no hearing deficit.  Urine color has been okay, pale and straw-colored.  He continues to work drinking adequate amount of water every day.  Rest of the pertinent 10 point ROS reviewed and negative.  REVIEW OF SYSTEMS:    Constitutional: Denies fevers, chills or abnormal night sweats Eyes: Denies blurriness of vision, double vision or watery eyes Ears, nose, mouth, throat, and face: Denies mucositis or sore throat Respiratory: Denies cough, dyspnea or wheezes Cardiovascular: Denies palpitation, chest discomfort or lower extremity swelling Gastrointestinal:  Denies nausea, heartburn or change in bowel habits Skin: Denies abnormal skin rashes Lymphatics: Denies new lymphadenopathy or easy bruising Neurological:Denies numbness, tingling or new weaknesses Behavioral/Psych: Mood is stable, no new changes  All other systems were reviewed with the patient and are negative.  MEDICAL HISTORY:  Past Medical History:  Diagnosis Date  . Hypertension   . Lung cancer ([place])    small cell RUL    SURGICAL HISTORY: Past Surgical History:  Procedure Laterality Date  . BRONCHIAL BRUSHINGS  02/08/2020   Procedure: BRONCHIAL BRUSHINGS;  Surgeon: Freddi Starr, MD;  Location: [employer] [facility];  Service: Pulmonary;;  . BRONCHIAL NEEDLE ASPIRATION BIOPSY  02/08/2020   Procedure: BRONCHIAL NEEDLE ASPIRATION BIOPSIES;  Surgeon: Freddi Starr, MD;  Location: [facility] ENDOSCOPY;  Service: Pulmonary;;  . KNEE SURGERY Right   . SHOULDER SURGERY Left   . VIDEO BRONCHOSCOPY WITH ENDOBRONCHIAL ULTRASOUND N/A 02/08/2020   Procedure: VIDEO  BRONCHOSCOPY WITH ENDOBRONCHIAL ULTRASOUND;  Surgeon: Freddi Starr, MD;  Location: [facility];  Service: Pulmonary;  Laterality: N/A;    SOCIAL HISTORY: Social History   Socioeconomic History  . Marital status: Single    Spouse name: [name]  . Number of children: Not on file  . Years of education: Not on file  . Highest education level: Not on file  Occupational History  . Not on file  Tobacco Use  . Smoking status: Former Smoker    Packs/day: 1.00    Years: 20.00    Pack years: 20.00    Types: Cigarettes    Quit date: 06/09/2018    Years since quitting: 1.7  . Smokeless tobacco: Never Used  Vaping Use  . Vaping Use: Never used  Substance and Sexual Activity  . Alcohol use: Not Currently  . Drug use: Never  . Sexual activity: Yes  Other Topics Concern  . Not on file  Social History Narrative  . Not on file   Social Determinants of Health   Financial Resource Strain: Not on file  Food Insecurity: Not on file  Transportation Needs: Not on file  Physical Activity: Not on file  Stress: Not on file  Social Connections: Not on file  Intimate Partner Violence: Not on file    FAMILY HISTORY: Family History  Problem Relation Age of Onset  . Heart disease Mother   . Cancer Cousin   . Breast cancer Neg Hx   . Colon cancer Neg Hx   . Pancreatic cancer Neg  Hx   . Prostate cancer Neg Hx     ALLERGIES:  has No Known Allergies.  MEDICATIONS:  Current Outpatient Medications  Medication Sig Dispense Refill  . amLODipine (NORVASC) 10 MG tablet Take 10 mg by mouth daily.    Marland Kitchen ibuprofen (ADVIL) 600 MG tablet Take 600 mg by mouth every 6 (six) hours as needed. (Patient not taking: No sig reported)    . lisinopril-hydrochlorothiazide (ZESTORETIC) 20-12.5 MG tablet Take 1 tablet by mouth daily.     No current facility-administered medications for this visit.   Facility-Administered Medications Ordered in Other Visits  Medication Dose Route Frequency Provider Last  Rate Last Admin  . alteplase (CATHFLO ACTIVASE) injection 2 mg  2 mg Intracatheter Once PRN Iruku, Praveena, MD      . sodium chloride flush (NS) 0.9 % injection 10 mL  10 mL Intracatheter PRN Iruku, Praveena, MD      . sodium chloride flush (NS) 0.9 % injection 3 mL  3 mL Intracatheter PRN Iruku, Praveena, MD      . sodium chloride flush (NS) 0.9 % injection 3 mL  3 mL Intracatheter PRN Iruku, Praveena, MD        PHYSICAL EXAMINATION:  ECOG PERFORMANCE STATUS: 0 - Asymptomatic  Vitals:   03/22/20 1021  BP: 135/84  Pulse: 87  Resp: 14  Temp: 97.6 F (36.4 C)  SpO2: 100%   Filed Weights   03/22/20 1021  Weight: 181 lb 9.6 oz (82.4 kg)    GENERAL:alert, no distress and comfortable SKIN: skin color, texture, turgor are normal, no rashes or significant lesions EYES: normal, conjunctiva are pink and non-injected, sclera clear OROPHARYNX:no exudate, no erythema and lips, buccal mucosa, and tongue normal  NECK: supple, thyroid normal size, non-tender, without nodularity LYMPH:  no palpable lymphadenopathy in the cervical, axillary or inguinal LUNGS: clear to auscultation and percussion with normal breathing effort HEART: regular rate & rhythm and no murmurs and no lower extremity edema ABDOMEN:abdomen soft, non-tender and normal bowel sounds Musculoskeletal:no cyanosis of digits and no clubbing  PSYCH: alert & oriented x 3 with fluent speech NEURO: no focal motor/sensory deficits  LABORATORY DATA:  I have reviewed the data as listed Lab Results  Component Value Date   WBC 5.0 03/22/2020   HGB 11.0 (L) 03/22/2020   HCT 33.8 (L) 03/22/2020   MCV 81.8 03/22/2020   PLT 246 03/22/2020     Chemistry      Component Value Date/Time   NA 138 03/22/2020 0927   K 3.5 03/22/2020 0927   CL 102 03/22/2020 0927   CO2 28 03/22/2020 0927   BUN 14 03/22/2020 0927   CREATININE 0.83 03/22/2020 0927      Component Value Date/Time   CALCIUM 9.2 03/22/2020 0927   ALKPHOS 65 03/22/2020  0927   AST 11 (L) 03/22/2020 0927   ALT 13 03/22/2020 0927   BILITOT 0.7 03/22/2020 0927     Labs reviewed, no major concerns.  RADIOGRAPHIC STUDIES: I have personally reviewed the radiological images as listed and agreed with the findings in the report. No results found.  All questions were answered. The patient knows to call the clinic with any problems, questions or concerns. I spent 30 minutes in the care of this patient including H and P, review of records, counseling and coordination of care.     Benay Pike, MD 03/22/2020 10:57 AM

## 2020-03-22 NOTE — Research (Signed)
[id number] TESTING THE ADDITION OF ATEZOLIZUMAB TO THE USUAL CHEMORADIATION TREATMENT FOR LIMITED STAGE SMALL CELL LUNG CANCER  Adam [name] arrives [place] today for on-study RT, C1D8 labs and MD visit.  PROs: Required PRO-CTCAE is provided for completion upon arrival. Questionnaire is reviewed for completeness and all questions have been answered.  RT: Adam [name] completes his daily RT without complaint.   LABS: Mandatory study labs are collected per study protocol and Adam [name] tolerates the procedure well without complaint.  VITAL SIGNS: Vital signs are collected per protocol.   MEDICATION REVIEW: Adam [name] reviews the current med list and verifies the list as correct.  MD VISIT: Adam [name] sees Dr. Chryl [name] today: he denies any complaints other than the same minor cough (has not worsened or changed since baseline) and mild fatigue.  ADVERSE EVENTS: Adam [name] has a baseline history of hypertension, anemia and a minor cough, all present prior to the pre-registration cycle of treatment and none of which have increased in grade (all grade 1, CTCAE 5.0). Today's lab results reveal grade 1 hypoalbuminemia and grade 1 anemia, neither of which are considered clinically significant.   Per PRO-CTCAE questionnaire completed by Adam [name], he reports mild anorexia (grade 1) with a weight loss just under one pound since his last visit (182lb 8oz (03/17/20) to 181lb 9.6oz (03/22/20)). He reports the same mild cough (noted above, present at baseline), mild insomnia (grade 1) and mild fatigue relieved by rest (grade 1).   Event Grade Onset Date End Date Status Comments Attribution to Cisplatin Attribution to Etoposide  Cough  1 Baseline  Ongoing No intervention required X   Unrelated o Unlikely o Possible o Probable o Definite  X   Unrelated o Unlikely o Possible o Probable o Definite   Anorexia 1 03/22/2020   Ongoing Weight loss <1lb X   Unrelated o Unlikely o Possible o Probable o Definite  X    Unrelated o Unlikely o Possible o Probable o Definite   Palpitations 1 03/22/2020  Ongoing No intervention required X   Unrelated o Unlikely o Possible o Probable o Definite  X   Unrelated o Unlikely o Possible o Probable o Definite   Insomnia 1 03/22/2020  Ongoing No intervention required X   Unrelated o Unlikely o Possible o Probable o Definite  X   Unrelated o Unlikely o Possible o Probable o Definite   Fatigue 1 03/22/2020  Ongoing Relieved by rest X   Unrelated o Unlikely o Possible o Probable o Definite  X   Unrelated o Unlikely o Possible o Probable o Definite    SCHEDULE: Adam [name] has a copy of his current schedule and is thanked for his time and voluntary participation in this study.   Current plan is for Adam [name] to return to the cancer center tomorrow to continue daily RT. Next MD/lab visit is scheduled for 03/29/2020.   [name]. Sharlett Iles, BSN, RN, CIC 03/22/2020 12:37 PM

## 2020-03-23 ENCOUNTER — Ambulatory Visit
Admission: RE | Admit: 2020-03-23 | Discharge: 2020-03-23 | Disposition: A | Discharge status: Home / Self Care | Payer: 59 | Source: Ambulatory Visit | Attending: Radiation Oncology | Admitting: Radiation Oncology

## 2020-03-23 DIAGNOSIS — Z510 Encounter for antineoplastic radiation therapy: Secondary | ICD-10-CM | POA: Diagnosis not present

## 2020-03-24 ENCOUNTER — Ambulatory Visit
Admission: RE | Admit: 2020-03-24 | Discharge: 2020-03-24 | Disposition: A | Discharge status: Home / Self Care | Payer: 59 | Source: Ambulatory Visit | Attending: Radiation Oncology | Admitting: Radiation Oncology

## 2020-03-24 ENCOUNTER — Other Ambulatory Visit: Payer: Self-pay

## 2020-03-24 DIAGNOSIS — Z510 Encounter for antineoplastic radiation therapy: Secondary | ICD-10-CM | POA: Diagnosis not present

## 2020-03-25 ENCOUNTER — Ambulatory Visit
Admission: RE | Admit: 2020-03-25 | Discharge: 2020-03-25 | Disposition: A | Discharge status: Home / Self Care | Payer: 59 | Source: Ambulatory Visit | Attending: Radiation Oncology | Admitting: Radiation Oncology

## 2020-03-25 DIAGNOSIS — Z510 Encounter for antineoplastic radiation therapy: Secondary | ICD-10-CM | POA: Diagnosis not present

## 2020-03-28 ENCOUNTER — Other Ambulatory Visit: Payer: Self-pay

## 2020-03-28 ENCOUNTER — Ambulatory Visit
Admission: RE | Admit: 2020-03-28 | Discharge: 2020-03-28 | Disposition: A | Discharge status: Home / Self Care | Payer: 59 | Source: Ambulatory Visit | Attending: Radiation Oncology | Admitting: Radiation Oncology

## 2020-03-28 DIAGNOSIS — Z510 Encounter for antineoplastic radiation therapy: Secondary | ICD-10-CM | POA: Diagnosis not present

## 2020-03-28 NOTE — Progress Notes (Unsigned)
[place] CONSULT NOTE  Patient Care Team: Patient, No Pcp Per as PCP - General (General Practice) Adam Kaufman, RN as Registered Nurse  CHIEF COMPLAINTS/PURPOSE OF CONSULTATION:  Limited stage small cell lung cancer.  ASSESSMENT & PLAN:  Small cell lung cancer, 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HCC) Enrolled to the following study.  LIMITED STAGE SMALL CELL LUNG CANCER (LS-SCLC): A PHASE III  RANDOMIZED STUDY OF CHEMORADIATION VERSUS CHEMORADIATION PLUS ATEZOLIZUMAB (10-June-2019)  C1D1 cis/etoposide completed on [place] 2.10.22. C2D1 completed on 03/15/2020 through 03/17/2020  He is here for follow-up per research protocol. He is doing remarkably well. No complaints at all. PE today unremarkable, no concern for adverse effects He doesn't appear to have any toxicity from concurrent chemoradiation at this time.  No concern for pneumonitis, pericarditis, esophagitis, encephalitis. Anticipate repeat labs next week and if satisfactory, we will proceed with C3 chemotherapy as planned.  Chemotherapy induced neutropenia (HCC) Chemotherapy induced neutropenia, ANC of 0.4 Advised neutropenic precautions.  No orders of the defined types were placed in this encounter.  Thank you for consulting [place] in the care of this patient.  Please not hesitate to contact us with any additional questions or concerns.  HISTORY OF PRESENTING ILLNESS:  Adam [name] 65 y.o. male is here because of new diagnosis of limited stage small cell lung cancer.  Adam [name] 65 y.o. male is here because of hemoptysis   This is a 65 yr old male patient with PMH significant for HTN presented with chief complaint of hemoptysis. He had a dental extraction 3 months ago and soon after noticed scant hemoptysis which he attributed to the dental extraction. He then had two more episodes of scant hemoptysis, he describes it as pea size in his sputum. He may have noticed worsening SOB in the past 3 months but not  significant. He lost about 10 lbs of weight in the past several months. He denies any chest pain or worsening cough. No other complaints. No change in bowel or urinary habits. He smoked 20 PPY, quit about 2 yrs ago. He worked as a Probation officer, Theme park manager.  CT chest 02/06/2020 showed right upper lobe mass lesion with associated large nodal mass involving the right suprahilar region and mediastinum in the paratracheal region consistent with primary pulmonary neoplasm and metastatic nodal involvement. CT abdomen pelvis from 02/07/2020 showed an 8 mm nonobstructing calculus in the left kidney, small umbilical hernia, bubbly mixed lytic and sclerotic lesion within the right femoral head of uncertain acuity could represent a small enchondroma, metastatic is considered less likely but not entirely excluded. MRI brain from 02/07/2020 showed single polypoid focus of susceptibility artifact and faint nodular enhancement within the central pons.  Findings favored to reflect a small vascular structure short interval follow-up in 60-month suggested to document stability.  No other MRI evidence of intracranial metastatic disease. Cytology from bronchoscopy showed malignant cells consistent with small cell carcinoma in the 4R lymph node.  He is an excellent candidate for the following trial.  LIMITED STAGE SMALL CELL LUNG CANCER (LS-SCLC): A PHASE III  RANDOMIZED STUDY OF CHEMORADIATION VERSUS CHEMORADIATION PLUS ATEZOLIZUMAB (10-June-2019)  C1 D1 02/16/2020-02/18/2020 He has consented for the trial and is enrolled to the arm without immunotherapy. C2 D1-D3 03/15/2020-03/17/2020  Interim History  He is here for a FU. He is doing well overall. No concerns. No fatigue. He had couple episodes of back pain when he took deep breaths, this has resolved No chest pain, chest pressure, hemoptysis  No diarrhea, change in urinary habits. No new neurological complaints. Overall everything is going very  well.  Rest of the pertinent 10 point ROS reviewed and negative.  REVIEW OF SYSTEMS:    Constitutional: Denies fevers, chills or abnormal night sweats Eyes: Denies blurriness of vision, double vision or watery eyes Ears, nose, mouth, throat, and face: Denies mucositis or sore throat Respiratory: Denies cough, dyspnea or wheezes Cardiovascular: Denies palpitation, chest discomfort or lower extremity swelling Gastrointestinal:  Denies nausea, heartburn or change in bowel habits Skin: Denies abnormal skin rashes Lymphatics: Denies new lymphadenopathy or easy bruising Neurological:Denies numbness, tingling or new weaknesses Behavioral/Psych: Mood is stable, no new changes  All other systems were reviewed with the patient and are negative.  MEDICAL HISTORY:  Past Medical History:  Diagnosis Date  . Hypertension   . Lung cancer ([place])    small cell RUL    SURGICAL HISTORY: Past Surgical History:  Procedure Laterality Date  . BRONCHIAL BRUSHINGS  02/08/2020   Procedure: BRONCHIAL BRUSHINGS;  Surgeon: Freddi Starr, MD;  Location: [place];  Service: Pulmonary;;  . BRONCHIAL NEEDLE ASPIRATION BIOPSY  02/08/2020   Procedure: BRONCHIAL NEEDLE ASPIRATION BIOPSIES;  Surgeon: Freddi Starr, MD;  Location: [facility] ENDOSCOPY;  Service: Pulmonary;;  . KNEE SURGERY Right   . SHOULDER SURGERY Left   . VIDEO BRONCHOSCOPY WITH ENDOBRONCHIAL ULTRASOUND N/A 02/08/2020   Procedure: VIDEO BRONCHOSCOPY WITH ENDOBRONCHIAL ULTRASOUND;  Surgeon: Freddi Starr, MD;  Location: [place];  Service: Pulmonary;  Laterality: N/A;    SOCIAL HISTORY: Social History   Socioeconomic History  . Marital status: Single    Spouse name: [name]  . Number of children: Not on file  . Years of education: Not on file  . Highest education level: Not on file  Occupational History  . Not on file  Tobacco Use  . Smoking status: Former Smoker    Packs/day: 1.00    Years: 20.00    Pack years: 20.00     Types: Cigarettes    Quit date: 06/09/2018    Years since quitting: 1.8  . Smokeless tobacco: Never Used  Vaping Use  . Vaping Use: Never used  Substance and Sexual Activity  . Alcohol use: Not Currently  . Drug use: Never  . Sexual activity: Yes  Other Topics Concern  . Not on file  Social History Narrative  . Not on file   Social Determinants of Health   Financial Resource Strain: Not on file  Food Insecurity: Not on file  Transportation Needs: Not on file  Physical Activity: Not on file  Stress: Not on file  Social Connections: Not on file  Intimate Partner Violence: Not on file    FAMILY HISTORY: Family History  Problem Relation Age of Onset  . Heart disease Mother   . Cancer Cousin   . Breast cancer Neg Hx   . Colon cancer Neg Hx   . Pancreatic cancer Neg Hx   . Prostate cancer Neg Hx     ALLERGIES:  has No Known Allergies.  MEDICATIONS:  Current Outpatient Medications  Medication Sig Dispense Refill  . amLODipine (NORVASC) 10 MG tablet Take 10 mg by mouth daily.    Marland Kitchen ibuprofen (ADVIL) 600 MG tablet Take 600 mg by mouth every 6 (six) hours as needed. (Patient not taking: No sig reported)    . lisinopril-hydrochlorothiazide (ZESTORETIC) 20-12.5 MG tablet Take 1 tablet by mouth daily.     No current facility-administered medications for this visit.  PHYSICAL EXAMINATION:  ECOG PERFORMANCE STATUS: 0 - Asymptomatic  Vitals:   03/29/20 0858  BP: (!) 138/95  Pulse: 97  Resp: 18  Temp: (!) 91 F (32.8 C)  SpO2: 100%   Filed Weights   03/29/20 0858  Weight: 181 lb 11.2 oz (82.4 kg)    GENERAL:alert, no distress and comfortable SKIN: skin color, texture, turgor are normal, no rashes or significant lesions EYES: normal, conjunctiva are pink and non-injected, sclera clear OROPHARYNX:no exudate, no erythema and lips, buccal mucosa, and tongue normal  NECK: supple, thyroid normal size, non-tender, without nodularity LYMPH:  no palpable lymphadenopathy  in the cervical, axillary or inguinal LUNGS: clear to auscultation and percussion with normal breathing effort HEART: regular rate & rhythm and no murmurs and no lower extremity edema ABDOMEN:abdomen soft, non-tender and normal bowel sounds Musculoskeletal:no cyanosis of digits and no clubbing  PSYCH: alert & oriented x 3 with fluent speech NEURO: no focal motor/sensory deficits  LABORATORY DATA:  I have reviewed the data as listed Lab Results  Component Value Date   WBC 1.8 (L) 03/29/2020   HGB 11.4 (L) 03/29/2020   HCT 35.1 (L) 03/29/2020   MCV 81.3 03/29/2020   PLT 163 03/29/2020     Chemistry      Component Value Date/Time   NA 138 03/22/2020 0927   K 3.5 03/22/2020 0927   CL 102 03/22/2020 0927   CO2 28 03/22/2020 0927   BUN 14 03/22/2020 0927   CREATININE 0.83 03/22/2020 0927      Component Value Date/Time   CALCIUM 9.2 03/22/2020 0927   ALKPHOS 65 03/22/2020 0927   AST 11 (L) 03/22/2020 0927   ALT 13 03/22/2020 0927   BILITOT 0.7 03/22/2020 0927     Labs reviewed, no major concerns.  RADIOGRAPHIC STUDIES: I have personally reviewed the radiological images as listed and agreed with the findings in the report. No results found.  All questions were answered. The patient knows to call the clinic with any problems, questions or concerns. I spent 30 minutes in the care of this patient including H and P, review of records, counseling and coordination of care.     Benay Pike, MD 03/29/2020 9:20 AM

## 2020-03-28 NOTE — Assessment & Plan Note (Signed)
Enrolled to the following study.  LIMITED STAGE SMALL CELL LUNG CANCER (LS-SCLC): A PHASE III  RANDOMIZED STUDY OF CHEMORADIATION VERSUS CHEMORADIATION PLUS ATEZOLIZUMAB (10-June-2019)  C1D1 cis/etoposide completed on [place] 2.10.22. C2D1 completed on 03/15/2020 through 03/17/2020  He is here for follow-up per research protocol. He is doing remarkably well. No complaints at all. PE today unremarkable, no concern for adverse effects He doesn't appear to have any toxicity from concurrent chemoradiation at this time.  No concern for pneumonitis, pericarditis, esophagitis, encephalitis. Anticipate repeat labs next week and if satisfactory, we will proceed with C3 chemotherapy as planned.

## 2020-03-29 ENCOUNTER — Other Ambulatory Visit: Payer: 59

## 2020-03-29 ENCOUNTER — Ambulatory Visit
Admission: RE | Admit: 2020-03-29 | Discharge: 2020-03-29 | Disposition: A | Discharge status: Home / Self Care | Payer: 59 | Source: Ambulatory Visit | Attending: Radiation Oncology | Admitting: Radiation Oncology

## 2020-03-29 ENCOUNTER — Telehealth: Payer: Self-pay

## 2020-03-29 ENCOUNTER — Encounter: Payer: Self-pay | Admitting: Hematology and Oncology

## 2020-03-29 ENCOUNTER — Ambulatory Visit: Payer: 59

## 2020-03-29 ENCOUNTER — Inpatient Hospital Stay (HOSPITAL_BASED_OUTPATIENT_CLINIC_OR_DEPARTMENT_OTHER): Payer: 59 | Admitting: Hematology and Oncology

## 2020-03-29 ENCOUNTER — Ambulatory Visit: Payer: 59 | Admitting: Hematology and Oncology

## 2020-03-29 ENCOUNTER — Inpatient Hospital Stay: Payer: 59

## 2020-03-29 DIAGNOSIS — T451X5A Adverse effect of antineoplastic and immunosuppressive drugs, initial encounter: Secondary | ICD-10-CM | POA: Diagnosis not present

## 2020-03-29 DIAGNOSIS — D701 Agranulocytosis secondary to cancer chemotherapy: Secondary | ICD-10-CM

## 2020-03-29 DIAGNOSIS — C3411 Malignant neoplasm of upper lobe, right bronchus or lung: Secondary | ICD-10-CM

## 2020-03-29 DIAGNOSIS — Z510 Encounter for antineoplastic radiation therapy: Secondary | ICD-10-CM | POA: Diagnosis not present

## 2020-03-29 LAB — CBC WITH DIFFERENTIAL (CANCER CENTER ONLY)
Abs Immature Granulocytes: 0 10*3/uL (ref 0.00–0.07)
Basophils Absolute: 0 10*3/uL (ref 0.0–0.1)
Basophils Relative: 1 %
Eosinophils Absolute: 0.2 10*3/uL (ref 0.0–0.5)
Eosinophils Relative: 9 %
HCT: 35.1 % — ABNORMAL LOW (ref 39.0–52.0)
Hemoglobin: 11.4 g/dL — ABNORMAL LOW (ref 13.0–17.0)
Immature Granulocytes: 0 %
Lymphocytes Relative: 52 %
Lymphs Abs: 0.9 10*3/uL (ref 0.7–4.0)
MCH: 26.4 pg (ref 26.0–34.0)
MCHC: 32.5 g/dL (ref 30.0–36.0)
MCV: 81.3 fL (ref 80.0–100.0)
Monocytes Absolute: 0.3 10*3/uL (ref 0.1–1.0)
Monocytes Relative: 16 %
Neutro Abs: 0.4 10*3/uL — CL (ref 1.7–7.7)
Neutrophils Relative %: 22 %
Platelet Count: 163 10*3/uL (ref 150–400)
RBC: 4.32 MIL/uL (ref 4.22–5.81)
RDW: 15.9 % — ABNORMAL HIGH (ref 11.5–15.5)
WBC Count: 1.8 10*3/uL — ABNORMAL LOW (ref 4.0–10.5)
nRBC: 0 % (ref 0.0–0.2)

## 2020-03-29 LAB — CMP (CANCER CENTER ONLY)
ALT: 14 U/L (ref 0–44)
AST: 15 U/L (ref 15–41)
Albumin: 3.4 g/dL — ABNORMAL LOW (ref 3.5–5.0)
Alkaline Phosphatase: 71 U/L (ref 38–126)
Anion gap: 10 (ref 5–15)
BUN: 15 mg/dL (ref 8–23)
CO2: 27 mmol/L (ref 22–32)
Calcium: 9.7 mg/dL (ref 8.9–10.3)
Chloride: 100 mmol/L (ref 98–111)
Creatinine: 1 mg/dL (ref 0.61–1.24)
GFR, Estimated: >60 mL/min (ref >60)
Glucose, Bld: 91 mg/dL (ref 70–99)
Potassium: 4.3 mmol/L (ref 3.5–5.1)
Sodium: 137 mmol/L (ref 135–145)
Total Bilirubin: 0.3 mg/dL (ref 0.3–1.2)
Total Protein: 7.6 g/dL (ref 6.5–8.1)

## 2020-03-29 NOTE — Telephone Encounter (Signed)
CRITICAL VALUE STICKER  CRITICAL VALUE: ANC = 0.4  RECEIVER (on-site recipient of call): Yetta Glassman, [place] NOTIFIED: 03/29/20 at [place] (representative from lab): Pam  MD NOTIFIED: Dr. Chryl Heck  TIME OF NOTIFICATION: 03/29/20 at 9:02am  RESPONSE: Notification given directly to Dr. Chryl Heck for follow-up with pt.

## 2020-03-29 NOTE — Assessment & Plan Note (Signed)
Chemotherapy induced neutropenia, ANC of 0.4 Advised neutropenic precautions.

## 2020-03-30 ENCOUNTER — Ambulatory Visit
Admission: RE | Admit: 2020-03-30 | Discharge: 2020-03-30 | Disposition: A | Discharge status: Home / Self Care | Payer: 59 | Source: Ambulatory Visit | Attending: Radiation Oncology | Admitting: Radiation Oncology

## 2020-03-30 ENCOUNTER — Inpatient Hospital Stay (HOSPITAL_BASED_OUTPATIENT_CLINIC_OR_DEPARTMENT_OTHER): Payer: 59 | Admitting: Medical

## 2020-03-30 ENCOUNTER — Ambulatory Visit: Payer: 59

## 2020-03-30 ENCOUNTER — Other Ambulatory Visit: Payer: Self-pay

## 2020-03-30 VITALS — BP 133/98 | HR 104 | Temp 98.5°F | Resp 18 | Wt 181.3 lb

## 2020-03-30 DIAGNOSIS — R601 Generalized edema: Secondary | ICD-10-CM

## 2020-03-30 DIAGNOSIS — C3411 Malignant neoplasm of upper lobe, right bronchus or lung: Secondary | ICD-10-CM

## 2020-03-30 DIAGNOSIS — R600 Localized edema: Secondary | ICD-10-CM

## 2020-03-30 DIAGNOSIS — Z510 Encounter for antineoplastic radiation therapy: Secondary | ICD-10-CM | POA: Diagnosis not present

## 2020-03-30 MED ORDER — PREDNISONE 5 MG PO TABS
ORAL_TABLET | ORAL | 0 refills | Status: DC
Start: 2020-03-30 — End: 2020-05-03

## 2020-03-30 MED ORDER — SODIUM CHLORIDE 0.9 % IV SOLN
10.0000 mg | Freq: Once | INTRAVENOUS | Status: AC
  Administered 2020-03-30: 10 mg via INTRAVENOUS
  Filled 2020-03-30: qty 10

## 2020-03-30 MED ORDER — SODIUM CHLORIDE 0.9 % IV SOLN
INTRAVENOUS | Status: DC
  Filled 2020-03-30: qty 250

## 2020-03-30 NOTE — Research (Cosign Needed)
[id number] TESTING THE ADDITION OF ATEZOLIZUMAB TO THE USUAL CHEMORADIATION TREATMENT FOR LIMITED STAGE SMALL CELL LUNG CANCER  Quinn arrives [facility] today for on-study RT, C1D15 labs and MD visit.  PROs: Required PRO-CTCAE is provided for completion upon arrival. Questionnaire is reviewed for completeness and all questions have been answered.  RT: Keymarion completes his daily RT without complaint.   LABS: Mandatory study labs are collected per study protocol and Antawan tolerates the procedure well without complaint.  VITAL SIGNS: Vital signs are collected per protocol.   MEDICATION REVIEW: Navon reviews the current med list and verifies the list as correct.  MD VISIT: Ariyan sees Dr. Chryl Heck today: he denies any complaints. He states his appetite is good and he no longer has a cough. He is sleeping better.  ADVERSE EVENTS: Andrik has a baseline history of hypertension, anemia and a minor cough, all present prior to the pre-registration cycle of treatment and none of which have increased in grade (all grade 1, CTCAE 5.0). In fact, his cough has resolved.   Today's lab results reveal grade 1 hypoalbuminemia and grade 1 anemia, neither of which are considered clinically significant. Bodee also has a grade 3 decreased WBC count and a grade 4 decreased neutrophil count, neither which require intervention at this time. Labs will be checked again prior to his next treatment to determine treatment modifications (if necessary).  Per PRO-CTCAE questionnaire completed by Jeneen Rinks, he reports mild ringing in his ears (tinnitus grade 1) this past week but states he has had that on-and-off for years.  He continues to experience mild fatigue relieved by rest (grade 1).   Event Grade Onset Date End Date Status Comments Attribution to Cisplatin Attribution to Etoposide  Cough  1 Baseline 03/29/2020 Resolved No intervention required X   Unrelated o Unlikely o Possible o Probable o Definite  X    Unrelated o Unlikely o Possible o Probable o Definite   Anorexia 1 03/22/2020 03/29/2020 Resolved Weight loss <1lb X   Unrelated o Unlikely o Possible o Probable o Definite  X   Unrelated o Unlikely o Possible o Probable o Definite   Palpitations 1 03/22/2020 03/29/2020 Resolved No intervention required X   Unrelated o Unlikely o Possible o Probable o Definite  X   Unrelated o Unlikely o Possible o Probable o Definite   Insomnia 1 03/22/2020 03/29/2020 Resolved No intervention required X   Unrelated o Unlikely o Possible o Probable o Definite  X   Unrelated o Unlikely o Possible o Probable o Definite   Fatigue 1 03/22/2020  Ongoing Relieved by rest X   Unrelated o Unlikely o Possible o Probable o Definite  X   Unrelated o Unlikely o Possible o Probable o Definite   Tinnitus 1 03/29/2020  Ongoing No intervention required X   Unrelated o Unlikely o Possible o Probable o Definite  X   Unrelated o Unlikely o Possible o Probable o Definite   Decreased white blood cell (WBC) count 3 03/29/2020  Ongoing No intervention at this time; will recheck prior to next treatment o Unrelated o Unlikely o Possible o Probable X   Definite  o Unrelated o Unlikely o Possible o Probable X   Definite   Decreased neutrophil count 4 03/29/2020  Ongoing No intervention at this time; will recheck prior to next treatment o Unrelated o Unlikely o Possible o Probable X   Definite  o Unrelated o Unlikely o Possible o Probable X   Definite  SCHEDULE: Izaya has a copy of his current schedule and is thanked for his time and voluntary participation in this study.   Current plan is for Kshawn to return to the cancer center tomorrow to continue daily RT. Next MD/lab visit is scheduled for 04/04/2020.   [name]. Sharlett Iles, BSN, RN, CIC 03/30/2020 10:58 AM

## 2020-03-30 NOTE — Patient Instructions (Signed)
Dexamethasone injection What is this medicine? DEXAMETHASONE (dex a METH a sone) is a corticosteroid. It is used to treat inflammation of the skin, joints, lungs, and other organs. Common conditions treated include asthma, allergies, and arthritis. It is also used for other conditions, like blood disorders and diseases of the adrenal glands. This medicine may be used for other purposes; ask your health care provider or pharmacist if you have questions. COMMON BRAND NAME(S): Decadron, DoubleDex, ReadySharp Dexamethasone, Simplist Dexamethasone, Solurex What should I tell my health care provider before I take this medicine? They need to know if you have any of these conditions:  Cushing's syndrome  diabetes  glaucoma  heart disease  high blood pressure  infection like herpes, measles, tuberculosis, or chickenpox  kidney disease  liver disease  mental illness  myasthenia gravis  osteoporosis  previous heart attack  seizures  stomach or intestine problems  thyroid disease  an unusual or allergic reaction to dexamethasone, corticosteroids, other medicines, lactose, foods, dyes, or preservatives  pregnant or trying to get pregnant  breast-feeding How should I use this medicine? This medicine is for injection into a muscle, joint, lesion, soft tissue, or vein. It is given by a health care professional in a hospital or clinic setting. Talk to your pediatrician regarding the use of this medicine in children. Special care may be needed. Overdosage: If you think you have taken too much of this medicine contact a poison control center or emergency room at once. NOTE: This medicine is only for you. Do not share this medicine with others. What if I miss a dose? This may not apply. If you are having a series of injections over a prolonged period, try not to miss an appointment. Call your doctor or health care professional to reschedule if you are unable to keep an appointment. What  may interact with this medicine? Do not take this medicine with any of the following medications:  live virus vaccines This medicine may also interact with the following medications:  aminoglutethimide  amphotericin B  aspirin and aspirin-like medicines  certain antibiotics like erythromycin, clarithromycin, and troleandomycin  certain antivirals for HIV or hepatitis  certain medicines for seizures like carbamazepine, phenobarbital, phenytoin  certain medicines to treat myasthenia gravis  cholestyramine  cyclosporine  digoxin  diuretics  ephedrine  male hormones, like estrogen or progestins and birth control pills  insulin or other medicines for diabetes  isoniazid  ketoconazole  medicines that relax muscles for surgery  mifepristone  NSAIDs, medicines for pain and inflammation, like ibuprofen or naproxen  rifampin  skin tests for allergies  thalidomide  vaccines  warfarin This list may not describe all possible interactions. Give your health care provider a list of all the medicines, herbs, non-prescription drugs, or dietary supplements you use. Also tell them if you smoke, drink alcohol, or use illegal drugs. Some items may interact with your medicine. What should I watch for while using this medicine? Visit your health care professional for regular checks on your progress. Tell your health care professional if your symptoms do not start to get better or if they get worse. Your condition will be monitored carefully while you are receiving this medicine. Wear a medical ID bracelet or chain. Carry a card that describes your disease and details of your medicine and dosage times. This medicine may increase your risk of getting an infection. Call your health care professional for advice if you get a fever, chills, or sore throat, or other symptoms of a  cold or flu. Do not treat yourself. Try to avoid being around people who are sick. Call your health care  professional if you are around anyone with measles, chickenpox, or if you develop sores or blisters that do not heal properly. If you are going to need surgery or other procedures, tell your doctor or health care professional that you have taken this medicine within the last 12 months. Ask your doctor or health care professional about your diet. You may need to lower the amount of salt you eat. This medicine may increase blood sugar. Ask your healthcare provider if changes in diet or medicines are needed if you have diabetes. What side effects may I notice from receiving this medicine? Side effects that you should report to your doctor or health care professional as soon as possible:  allergic reactions like skin rash, itching or hives, swelling of the face, lips, or tongue  bloody or black, tarry stools  changes in emotions or moods  changes in vision  confusion, excitement, restlessness  depressed mood  eye pain  hallucinations  muscle weakness  severe or sudden stomach or belly pain  signs and symptoms of high blood sugar such as being more thirsty or hungry or having to urinate more than normal. You may also feel very tired or have blurry vision.  signs and symptoms of infection like fever; chills; cough; sore throat; pain or trouble passing urine  swelling of ankles, feet  unusual bruising or bleeding  wounds that do not heal Side effects that usually do not require medical attention (report to your doctor or health care professional if they continue or are bothersome):  increased appetite  increased growth of face or body hair  headache  nausea, vomiting  pain, redness, or irritation at site where injected  skin problems, acne, thin and shiny skin  trouble sleeping  weight gain This list may not describe all possible side effects. Call your doctor for medical advice about side effects. You may report side effects to FDA at 1-800-FDA-1088. Where should I  keep my medicine? This medicine is given in a hospital or clinic and will not be stored at home. NOTE: This sheet is a summary. It may not cover all possible information. If you have questions about this medicine, talk to your doctor, pharmacist, or health care provider.  2021 Elsevier/Gold Standard (2018-07-08 13:51:58)

## 2020-03-31 ENCOUNTER — Ambulatory Visit
Admission: RE | Admit: 2020-03-31 | Discharge: 2020-03-31 | Disposition: A | Discharge status: Home / Self Care | Payer: 59 | Source: Ambulatory Visit | Attending: Radiation Oncology | Admitting: Radiation Oncology

## 2020-03-31 ENCOUNTER — Ambulatory Visit: Payer: 59

## 2020-03-31 DIAGNOSIS — Z510 Encounter for antineoplastic radiation therapy: Secondary | ICD-10-CM | POA: Diagnosis not present

## 2020-03-31 NOTE — Progress Notes (Signed)
[facility] Progress Note   Adam [name] [id number] October 12, 1955 65 y.o.  Adam [name] is managed by Dr. Benay Pike  Actively treated with chemotherapy/immunotherapy/hormonal therapy: yes  Current therapy: Cisplatin and etoposide with concurrent radiation therapy  Last treated: 03/17/2020 (cycle 1, day 3)  Next scheduled appointment with provider: 04/04/2020  Assessment: Plan:    Localized edema - Plan: dexamethasone (DECADRON) 10 mg in sodium chloride 0.9 % 50 mL IVPB, predniSONE (DELTASONE) 5 MG tablet, 0.9 %  sodium chloride infusion  Small cell lung cancer, 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HCC)   Localized edema of the lower lip: The patient was given Decadron 10 mg IV x1 and was given a prescription for a prednisone taper that he will begin tomorrow.  He was told to call 911 should he have progression of facial swelling, difficulty swallowing, and/or difficulty breathing.  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 Chryl Heck and is status post cycle 1, day 3 of cisplatin and etoposide given with concurrent radiation with chemotherapy dosed on 03/17/2020.  He continues on daily radiation therapy.  Please see After Visit Summary for patient specific instructions.  Future Appointments  Date Time Provider [place]  04/01/2020  8:45 AM [facility] LINAC 4 [facility] None  04/04/2020  8:00 AM [facility] LAB [facility] None  04/04/2020  8:30 AM [facility] LINAC 4 [facility] None  04/04/2020  9:00 AM [place], Arletha Pili, MD [facility] None  04/04/2020 10:45 AM [facility] None  04/05/2020  8:00 AM [facility] LINAC 4 [facility] None  04/05/2020  8:30 AM [facility] INFUSION [facility] None  04/06/2020  8:00 AM [facility] LINAC 4 [facility] None  04/06/2020  8:45 AM [facility] INFUSION [facility] None  04/07/2020  8:00 AM [facility] LINAC 4 [facility] None  04/07/2020  8:45 AM [facility] INFUSION [facility]  None  04/08/2020  8:00 AM [facility] LINAC 4 [facility] None  04/11/2020  8:00 AM [facility] LINAC 4 [facility] None  04/12/2020  8:00 AM [facility] LINAC 4 [facility] None  04/12/2020  8:45 AM [facility] LAB [facility] None  04/12/2020  9:00 AM Iruku, Praveena, MD [facility] None  04/13/2020  8:00 AM [facility] LINAC 4 [facility] None  04/14/2020  8:00 AM [facility] LINAC 4 [facility] None  04/15/2020  8:00 AM [facility] LINAC 4 [facility] None  04/18/2020  8:00 AM [facility] LINAC 4 [facility] None  04/19/2020  8:00 AM [facility] LINAC 4 [facility] None  04/19/2020  8:15 AM [facility] LAB [facility] None  04/19/2020  8:40 AM Iruku, Praveena, MD [facility] None  04/20/2020  8:00 AM [facility] LINAC 4 [facility] None  04/21/2020  8:00 AM [facility] LINAC 4 [facility] None  04/21/2020  1:00 PM [facility] LAB [facility] None  04/21/2020  1:40 PM Iruku, Praveena, MD [facility] None  04/22/2020  8:00 AM [facility] LINAC 4 [facility] None  04/25/2020  8:00 AM [facility] LINAC 4 [facility] None  04/26/2020  8:00 AM [facility] LINAC 4 [facility] None  04/26/2020  9:00 AM [facility] INFUSION [facility] None  04/27/2020  8:00 AM [facility] LINAC 4 [facility] None  04/27/2020  8:45 AM [facility] INFUSION [facility] None  04/28/2020  8:00 AM [facility] LINAC 4 [facility] None  04/28/2020  8:45 AM [facility] INFUSION [facility] None  05/03/2020  9:00 AM [facility] LAB [facility] None  05/03/2020  9:20 AM Iruku, Praveena, MD [facility] None  05/10/2020  9:00 AM [facility] LAB [facility] None  05/10/2020  9:20 AM Iruku, Praveena, MD [facility] None    No orders of the defined types  were placed in this encounter.      Subjective:   Patient ID:  Adam [name] is a 65 y.o. (DOB 06/08/1955) male.  Chief Complaint: No chief complaint on file.   HPI Adam [name]  is a 65 y.o. male with a diagnosis of a limited stage small cell carcinoma of the right lung.  He is followed by Dr.  Chryl Heck and is status post cycle 1, day 3 of cisplatin and etoposide given with concurrent radiation with chemotherapy dosed on 03/17/2020.  He continues on daily radiation therapy.  Adam [name] presents to the clinic today with swelling of his lower lip and face.  He noted an area of swelling and his left lateral lower lip yesterday.  It is progressed since yesterday and is now encompasses his entire lower lip face and chin.  He had Campho-Phenique put on the area after it began swelling yesterday.  He denies any change in personal hygiene products, drugs or foods.   Adam [name] is a participant in the clinical trial [place] TO THE USUAL CHEMORADIATION TREATMENT FOR LIMITED STAGE SMALL CELL LUNG CANCER.  He was seen today with Wilber Bihari, RN   Medications: I have reviewed the patient's current medications.  Allergies: No Known Allergies  Past Medical History:  Diagnosis Date  . Hypertension   . Lung cancer ([facility])    small cell RUL    Past Surgical History:  Procedure Laterality Date  . BRONCHIAL BRUSHINGS  02/08/2020   Procedure: BRONCHIAL BRUSHINGS;  Surgeon: Freddi Starr, MD;  Location: [facility];  Service: Pulmonary;;  . BRONCHIAL NEEDLE ASPIRATION BIOPSY  02/08/2020   Procedure: BRONCHIAL NEEDLE ASPIRATION BIOPSIES;  Surgeon: Freddi Starr, MD;  Location: [facility] ENDOSCOPY;  Service: Pulmonary;;  . KNEE SURGERY Right   . SHOULDER SURGERY Left   . VIDEO BRONCHOSCOPY WITH ENDOBRONCHIAL ULTRASOUND N/A 02/08/2020   Procedure: VIDEO BRONCHOSCOPY WITH ENDOBRONCHIAL ULTRASOUND;  Surgeon: Freddi Starr, MD;  Location: [place];  Service: Pulmonary;  Laterality: N/A;    Family History  Problem Relation Age of Onset  . Heart disease Mother   . Cancer Cousin   . Breast cancer Neg Hx   . Colon cancer Neg Hx   . Pancreatic cancer Neg Hx   . Prostate cancer Neg Hx     Social History   Socioeconomic History  . Marital status: Single     Spouse name: [name]  . Number of children: Not on file  . Years of education: Not on file  . Highest education level: Not on file  Occupational History  . Not on file  Tobacco Use  . Smoking status: Former Smoker    Packs/day: 1.00    Years: 20.00    Pack years: 20.00    Types: Cigarettes    Quit date: 06/09/2018    Years since quitting: 1.8  . Smokeless tobacco: Never Used  Vaping Use  . Vaping Use: Never used  Substance and Sexual Activity  . Alcohol use: Not Currently  . Drug use: Never  . Sexual activity: Yes  Other Topics Concern  . Not on file  Social History Narrative  . Not on file   Social Determinants of Health   Financial Resource Strain: Not on file  Food Insecurity: Not on file  Transportation Needs: Not on file  Physical Activity: Not on file  Stress: Not on file  Social Connections: Not on file  Intimate Partner Violence: Not on file  Past Medical History, Surgical history, Social history, and Family history were reviewed and updated as appropriate.   Please see review of systems for further details on the patient's review from today.   Review of Systems:  Review of Systems  Constitutional: Negative for chills, diaphoresis and fever.  HENT: Negative for trouble swallowing and voice change.        Edema of the lower lip, lower face, and chin.  Respiratory: Negative for cough, chest tightness, shortness of breath and wheezing.   Cardiovascular: Negative for chest pain and palpitations.  Gastrointestinal: Negative for abdominal pain, constipation, diarrhea, nausea and vomiting.  Musculoskeletal: Negative for back pain and myalgias.  Neurological: Negative for dizziness, light-headedness and headaches.    Objective:   Physical Exam:  BP (!) 133/98 (BP Location: Right Arm, Patient Position: Sitting)   Pulse (!) 104   Temp 98.5 F (36.9 C) (Oral)   Resp 18   Wt 181 lb 5 oz (82.2 kg)   SpO2 100%   BMI 26.78 kg/m  ECOG: 0  Physical  Exam Constitutional:      General: He is not in acute distress.    Appearance: He is not diaphoretic.  HENT:     Head: Normocephalic and atraumatic.     Right Ear: Tympanic membrane, ear canal and external ear normal.     Left Ear: Tympanic membrane, ear canal and external ear normal.     Mouth/Throat:     Mouth: Mucous membranes are moist.     Pharynx: Oropharynx is clear. No oropharyngeal exudate or posterior oropharyngeal erythema.     Comments: The patient's lower lip was edematous.    He is edentulous in his upper jaw.  He has 4 teeth remaining in his lower jaw.  These appear to be in disrepair with receding of the patient's gum.  No erythema, exudate, or lesions noted.  No masses or lesions were noted in the patient's lower face, jaw, or lip. Eyes:     General: No scleral icterus.       Right eye: No discharge.        Left eye: No discharge.     Conjunctiva/sclera: Conjunctivae normal.  Cardiovascular:     Rate and Rhythm: Normal rate and regular rhythm.     Heart sounds: Normal heart sounds. No murmur heard. No friction rub. No gallop.   Pulmonary:     Effort: Pulmonary effort is normal. No respiratory distress.     Breath sounds: Normal breath sounds. No wheezing or rales.  Musculoskeletal:     Cervical back: Normal range of motion and neck supple. No rigidity or tenderness.  Lymphadenopathy:     Cervical: No cervical adenopathy.  Skin:    General: Skin is warm and dry.     Findings: No erythema or rash.  Neurological:     Mental Status: He is alert.       Lab Review:     Component Value Date/Time   NA 137 03/29/2020 0837   K 4.3 03/29/2020 0837   CL 100 03/29/2020 0837   CO2 27 03/29/2020 0837   GLUCOSE 91 03/29/2020 0837   BUN 15 03/29/2020 0837   CREATININE 1.00 03/29/2020 0837   CALCIUM 9.7 03/29/2020 0837   PROT 7.6 03/29/2020 0837   ALBUMIN 3.4 (L) 03/29/2020 0837   AST 15 03/29/2020 0837   ALT 14 03/29/2020 0837   ALKPHOS 71 03/29/2020 0837    BILITOT 0.3 03/29/2020 0837   GFRNONAA >60 03/29/2020 1610  Component Value Date/Time   WBC 1.8 (L) 03/29/2020 0837   WBC 5.0 03/22/2020 0927   RBC 4.32 03/29/2020 0837   HGB 11.4 (L) 03/29/2020 0837   HCT 35.1 (L) 03/29/2020 0837   PLT 163 03/29/2020 0837   MCV 81.3 03/29/2020 0837   MCH 26.4 03/29/2020 0837   MCHC 32.5 03/29/2020 0837   RDW 15.9 (H) 03/29/2020 0837   LYMPHSABS 0.9 03/29/2020 0837   MONOABS 0.3 03/29/2020 0837   EOSABS 0.2 03/29/2020 0837   BASOSABS 0.0 03/29/2020 0837   -------------------------------  Imaging from last 24 hours (if applicable):  Radiology interpretation: No results found.

## 2020-04-01 ENCOUNTER — Ambulatory Visit
Admission: RE | Admit: 2020-04-01 | Discharge: 2020-04-01 | Disposition: A | Discharge status: Home / Self Care | Payer: 59 | Source: Ambulatory Visit | Attending: Radiation Oncology | Admitting: Radiation Oncology

## 2020-04-01 DIAGNOSIS — Z510 Encounter for antineoplastic radiation therapy: Secondary | ICD-10-CM | POA: Diagnosis not present

## 2020-04-04 ENCOUNTER — Other Ambulatory Visit: Payer: Self-pay

## 2020-04-04 ENCOUNTER — Encounter: Payer: Self-pay | Admitting: Hematology and Oncology

## 2020-04-04 ENCOUNTER — Ambulatory Visit
Admission: RE | Admit: 2020-04-04 | Discharge: 2020-04-04 | Disposition: A | Discharge status: Home / Self Care | Payer: 59 | Source: Ambulatory Visit | Attending: Radiation Oncology | Admitting: Radiation Oncology

## 2020-04-04 ENCOUNTER — Telehealth: Payer: Self-pay | Admitting: Hematology and Oncology

## 2020-04-04 ENCOUNTER — Inpatient Hospital Stay: Payer: 59

## 2020-04-04 ENCOUNTER — Other Ambulatory Visit (HOSPITAL_COMMUNITY): Payer: 59

## 2020-04-04 ENCOUNTER — Inpatient Hospital Stay (HOSPITAL_BASED_OUTPATIENT_CLINIC_OR_DEPARTMENT_OTHER): Payer: 59 | Admitting: Hematology and Oncology

## 2020-04-04 VITALS — BP 132/89 | HR 104 | Temp 97.1°F | Resp 18 | Ht 69.0 in | Wt 182.9 lb

## 2020-04-04 DIAGNOSIS — Z510 Encounter for antineoplastic radiation therapy: Secondary | ICD-10-CM | POA: Diagnosis not present

## 2020-04-04 DIAGNOSIS — C3411 Malignant neoplasm of upper lobe, right bronchus or lung: Secondary | ICD-10-CM

## 2020-04-04 DIAGNOSIS — Z006 Encounter for examination for normal comparison and control in clinical research program: Secondary | ICD-10-CM | POA: Diagnosis not present

## 2020-04-04 LAB — CMP (CANCER CENTER ONLY)
ALT: 20 U/L (ref 0–44)
AST: 12 U/L — ABNORMAL LOW (ref 15–41)
Albumin: 3.2 g/dL — ABNORMAL LOW (ref 3.5–5.0)
Alkaline Phosphatase: 66 U/L (ref 38–126)
Anion gap: 11 (ref 5–15)
BUN: 13 mg/dL (ref 8–23)
CO2: 28 mmol/L (ref 22–32)
Calcium: 9.2 mg/dL (ref 8.9–10.3)
Chloride: 100 mmol/L (ref 98–111)
Creatinine: 1.13 mg/dL (ref 0.61–1.24)
GFR, Estimated: >60 mL/min (ref >60)
Glucose, Bld: 108 mg/dL — ABNORMAL HIGH (ref 70–99)
Potassium: 4.1 mmol/L (ref 3.5–5.1)
Sodium: 139 mmol/L (ref 135–145)
Total Bilirubin: 0.3 mg/dL (ref 0.3–1.2)
Total Protein: 7.1 g/dL (ref 6.5–8.1)

## 2020-04-04 LAB — CBC WITH DIFFERENTIAL/PLATELET
Abs Immature Granulocytes: 0.3 10*3/uL — ABNORMAL HIGH (ref 0.00–0.07)
Basophils Absolute: 0.1 10*3/uL (ref 0.0–0.1)
Basophils Relative: 2 %
Eosinophils Absolute: 0.1 10*3/uL (ref 0.0–0.5)
Eosinophils Relative: 1 %
HCT: 37.7 % — ABNORMAL LOW (ref 39.0–52.0)
Hemoglobin: 12 g/dL — ABNORMAL LOW (ref 13.0–17.0)
Immature Granulocytes: 5 %
Lymphocytes Relative: 19 %
Lymphs Abs: 1.1 10*3/uL (ref 0.7–4.0)
MCH: 26.4 pg (ref 26.0–34.0)
MCHC: 31.8 g/dL (ref 30.0–36.0)
MCV: 83 fL (ref 80.0–100.0)
Monocytes Absolute: 2.3 10*3/uL — ABNORMAL HIGH (ref 0.1–1.0)
Monocytes Relative: 42 %
Neutro Abs: 1.7 10*3/uL (ref 1.7–7.7)
Neutrophils Relative %: 31 %
Platelets: 394 10*3/uL (ref 150–400)
RBC: 4.54 MIL/uL (ref 4.22–5.81)
RDW: 17.6 % — ABNORMAL HIGH (ref 11.5–15.5)
WBC: 5.5 10*3/uL (ref 4.0–10.5)
nRBC: 0 % (ref 0.0–0.2)

## 2020-04-04 NOTE — Progress Notes (Signed)
[place] CONSULT NOTE  Patient Care Team: Patient, No Pcp Per as PCP - General (General Practice) Adam Kaufman, RN as Registered Nurse  CHIEF COMPLAINTS/PURPOSE OF CONSULTATION:  Limited stage small cell lung cancer.  ASSESSMENT & PLAN:  Small cell lung cancer, 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HCC) Small cell lung cancer, limited stage  LIMITED STAGE SMALL CELL LUNG CANCER (LS-SCLC): A PHASE III  RANDOMIZED STUDY OF CHEMORADIATION VERSUS CHEMORADIATION PLUS ATEZOLIZUMAB (10-June-2019)  C1D1 cis/etoposide completed on [place] 2.10.22. C2D1 completed on 03/15/2020 through 03/17/2020  He is here for follow-up per research protocol. He is doing very well except for some lip swelling, treated with short course of prednisone, not thought to be related to treatment. This has now resolved. PE, sinus tachycardia, mild otherwise unremarkable, Will add TSH, no other possible etiologies for sinus tachycardia. No concern for pneumonitis, pericarditis, esophagitis, encephalitis. Labs from today, satisfactory to proceed. Anticipate C3 of chemotherapy D1 tomorrow.  Adam [name]   Orders Placed This Encounter  Procedures  . TSH    Standing Status:   Standing    Number of Occurrences:   22    Standing Expiration Date:   04/04/2021   Thank you for consulting [place] in the care of this patient.  Please not hesitate to contact us with any additional questions or concerns.  HISTORY OF PRESENTING ILLNESS:   Adam [name] 65 y.o. male is here because of new diagnosis of limited stage small cell lung cancer.  Adam [name] 65 y.o. male is here because of hemoptysis  This is a 65 yr old male patient with PMH significant for HTN presented with chief complaint of hemoptysis. He had a dental extraction 3 months ago and soon after noticed scant hemoptysis which he attributed to the dental extraction. He then had two more episodes of scant hemoptysis, he describes it as pea size in his  sputum. He may have noticed worsening SOB in the past 3 months but not significant. He lost about 10 lbs of weight in the past several months. He denies any chest pain or worsening cough. No other complaints. No change in bowel or urinary habits. He smoked 20 PPY, quit about 2 yrs ago. He worked as a Probation officer, Theme park manager.  CT chest 02/06/2020 showed right upper lobe mass lesion with associated large nodal mass involving the right suprahilar region and mediastinum in the paratracheal region consistent with primary pulmonary neoplasm and metastatic nodal involvement. CT abdomen pelvis from 02/07/2020 showed an 8 mm nonobstructing calculus in the left kidney, small umbilical hernia, bubbly mixed lytic and sclerotic lesion within the right femoral head of uncertain acuity could represent a small enchondroma, metastatic is considered less likely but not entirely excluded. MRI brain from 02/07/2020 showed single polypoid focus of susceptibility artifact and faint nodular enhancement within the central pons.  Findings favored to reflect a small vascular structure short interval follow-up in 67-month suggested to document stability.  No other MRI evidence of intracranial metastatic disease. Cytology from bronchoscopy showed malignant cells consistent with small cell carcinoma in the 4R lymph node.  He is an excellent candidate for the following trial.  LIMITED STAGE SMALL CELL LUNG CANCER (LS-SCLC): A PHASE III  RANDOMIZED STUDY OF CHEMORADIATION VERSUS CHEMORADIATION PLUS ATEZOLIZUMAB (10-June-2019)  C1 D1 02/16/2020-02/18/2020 He has consented for the trial and is enrolled to the arm without immunotherapy. C2 D1-D3 03/15/2020-03/17/2020  Interim History  He is here for a FU.   He is very  well except for the swollen lip that he had last week.  The swollen lip has resolved with a short course of prednisone.  He did not try any new foods, no new medication, he does have some allergies to  environmental factors but he cannot think of any particular thing that would have provoked this. He denies any change in breathing.  No chest pain, chest pressure, cough, hemoptysis. No headaches, difficulty swallowing, change in balance. No toxicity reported with chemotherapy and radiation. He has been eating well and has gained a little bit of weight. He is able to walk decent distances, does not do anything strenuous but cannot do all his activities of daily living. No diarrhea No change in urinary habits except for polyuria which he attributes to his hypertensive medication.  Rest of the pertinent 10 point ROS reviewed and negative.  REVIEW OF SYSTEMS:    Constitutional: Denies fevers, chills or abnormal night sweats Eyes: Denies blurriness of vision, double vision or watery eyes Ears, nose, mouth, throat, and face: Denies mucositis or sore throat Respiratory: Denies cough, dyspnea or wheezes Cardiovascular: Denies palpitation, chest discomfort or lower extremity swelling Gastrointestinal:  Denies nausea, heartburn or change in bowel habits Skin: Denies abnormal skin rashes Lymphatics: Denies new lymphadenopathy or easy bruising Neurological:Denies numbness, tingling or new weaknesses Behavioral/Psych: Mood is stable, no new changes  All other systems were reviewed with the patient and are negative.  MEDICAL HISTORY:  Past Medical History:  Diagnosis Date  . Hypertension   . Lung cancer ([place])    small cell RUL    SURGICAL HISTORY: Past Surgical History:  Procedure Laterality Date  . BRONCHIAL BRUSHINGS  02/08/2020   Procedure: BRONCHIAL BRUSHINGS;  Surgeon: Freddi Starr, [name];  Location: [facility];  Service: Pulmonary;;  . BRONCHIAL NEEDLE ASPIRATION BIOPSY  02/08/2020   Procedure: BRONCHIAL NEEDLE ASPIRATION BIOPSIES;  Surgeon: Freddi Starr, [name];  Location: [facility] ENDOSCOPY;  Service: Pulmonary;;  . KNEE SURGERY Right   . SHOULDER SURGERY Left   . VIDEO  BRONCHOSCOPY WITH ENDOBRONCHIAL ULTRASOUND N/A 02/08/2020   Procedure: VIDEO BRONCHOSCOPY WITH ENDOBRONCHIAL ULTRASOUND;  Surgeon: Freddi Starr, [name];  Location: [facility];  Service: Pulmonary;  Laterality: N/A;    SOCIAL HISTORY: Social History   Socioeconomic History  . Marital status: Single    Spouse name: [name]  . Number of children: Not on file  . Years of education: Not on file  . Highest education level: Not on file  Occupational History  . Not on file  Tobacco Use  . Smoking status: Former Smoker    Packs/day: 1.00    Years: 20.00    Pack years: 20.00    Types: Cigarettes    Quit date: 06/09/2018    Years since quitting: 1.8  . Smokeless tobacco: Never Used  Vaping Use  . Vaping Use: Never used  Substance and Sexual Activity  . Alcohol use: Not Currently  . Drug use: Never  . Sexual activity: Yes  Other Topics Concern  . Not on file  Social History Narrative  . Not on file   Social Determinants of Health   Financial Resource Strain: Not on file  Food Insecurity: Not on file  Transportation Needs: Not on file  Physical Activity: Not on file  Stress: Not on file  Social Connections: Not on file  Intimate Partner Violence: Not on file    FAMILY HISTORY: Family History  Problem Relation Age of Onset  . Heart disease Mother   .  Cancer Cousin   . Breast cancer Neg Hx   . Colon cancer Neg Hx   . Pancreatic cancer Neg Hx   . Prostate cancer Neg Hx     ALLERGIES:  has No Known Allergies.  MEDICATIONS:  Current Outpatient Medications  Medication Sig Dispense Refill  . amLODipine (NORVASC) 10 MG tablet Take 10 mg by mouth daily.    Marland Kitchen lisinopril-hydrochlorothiazide (ZESTORETIC) 20-12.5 MG tablet Take 1 tablet by mouth daily.    . predniSONE (DELTASONE) 5 MG tablet 6 tab x 2 days, 5 tab x 2 days, 4 tab x 2 days, 3 tab x 2 days, 2 tab x 2 days, 1 tab x 2 days, stop 42 tablet 0  . ibuprofen (ADVIL) 600 MG tablet Take 600 mg by mouth every 6 (six) hours  as needed. (Patient not taking: No sig reported)     No current facility-administered medications for this visit.    PHYSICAL EXAMINATION:  ECOG PERFORMANCE STATUS: 0 - Asymptomatic  Vitals:   04/04/20 0919  BP: 132/89  Pulse: (!) 104  Resp: 18  Temp: (!) 97.1 F (36.2 C)  SpO2: 98%   Filed Weights   04/04/20 0919  Weight: 182 lb 14.4 oz (83 kg)    GENERAL:alert, no distress and comfortable, swelling of lower lip has resolved.   SKIN: skin color, texture, turgor are normal, no rashes or significant lesions EYES: normal, conjunctiva are pink and non-injected, sclera clear OROPHARYNX:no exudate, no erythema and lips, buccal mucosa, and tongue normal  NECK: supple, thyroid normal size, non-tender, without nodularity LYMPH:  no palpable lymphadenopathy in the cervical, axillary or inguinal LUNGS: clear to auscultation and percussion with normal breathing effort HEART: Sinus tachycardia, no murmurs, no lower extremity edema.   ABDOMEN:abdomen soft, non-tender and normal bowel sounds Musculoskeletal:no cyanosis of digits and no clubbing  PSYCH: alert & oriented x 3 with fluent speech NEURO: no focal motor/sensory deficits . LABORATORY DATA:  I have reviewed the data as listed Lab Results  Component Value Date   WBC 5.5 04/04/2020   HGB 12.0 (L) 04/04/2020   HCT 37.7 (L) 04/04/2020   MCV 83.0 04/04/2020   PLT 394 04/04/2020     Chemistry      Component Value Date/Time   NA 139 04/04/2020 0802   K 4.1 04/04/2020 0802   CL 100 04/04/2020 0802   CO2 28 04/04/2020 0802   BUN 13 04/04/2020 0802   CREATININE 1.13 04/04/2020 0802      Component Value Date/Time   CALCIUM 9.2 04/04/2020 0802   ALKPHOS 66 04/04/2020 0802   AST 12 (L) 04/04/2020 0802   ALT 20 04/04/2020 0802   BILITOT 0.3 04/04/2020 0802     Labs reviewed, no major concerns.  Neutropenia has resolved. RADIOGRAPHIC STUDIES: I have personally reviewed the radiological images as listed and agreed with  the findings in the report.  No results found.  All questions were answered. The patient knows to call the clinic with any problems, questions or concerns. I spent 30 minutes in the care of this patient including H and P, review of records, counseling and coordination of care.     Adam Pike, [name] 04/04/2020 9:42 AM

## 2020-04-04 NOTE — Research (Signed)
[id number] TESTING THE ADDITION OF ATEZOLIZUMAB TO THE USUAL CHEMORADIATION TREATMENT FOR LIMITED STAGE SMALL CELL LUNG CANCER  Adam [name] arrives [place] today for on-study RT, Cycle 2 labs and MD visit.  PROs: No PROs questionnaires are required today.  RT: Adam [name] completes his daily RT without complaint.   LABS: Mandatory study labs are collected per study protocol and Adam [name] tolerates the procedure well without complaint.  VITAL SIGNS: Vital signs are collected per protocol. Pulse rate is elevated: see adverse events.   MEDICATION REVIEW: Adam [name] reviews the current med list and verifies the list as correct. He has completed his prednisone taper stating he took the prednisone for two days following the symptom management visit. Adam [name] is strongly encouraged to take any prescribed medications exactly as directed and not to discontinue any meds without consulting a physician and he verbalizes understanding.  MD VISIT: Adam [name] sees Dr. Chryl Heck today: he denies any complaints. Adam [name] was examined and treated last week (03/30/2020) for acute edema of his lower lip and was treated with IV Decadron and discharged home on PO prednisone taper. Adam [name] reports his lip was significantly less swollen by that afternoon following IV Decadron. His lip and jaw are "back to normal" now.  ADVERSE EVENTS: Adam [name] has a baseline history of hypertension, anemia and a minor cough, all present prior to the pre-registration cycle of treatment and none of which have increased in grade (all grade 1, CTCAE 5.0). His cough remains resolved.   Adam [name] was seen in the Symptom Management clinic on 03/30/2020 for localized edema of his lower lip. He was treated with IV Decadron and discharged home on a PO prednisone taper (see the 03/30/20 visit for details).  Today's lab results reveal grade 1 hyperglycemia, grade 1 hypoalbuminemia and grade 1 anemia: none are considered clinically significant and per Dr. Chryl Heck, treatment will proceed as  planned tomorrow.   Adam [name] is slightly tachycardic today with a pulse of 104 (grade 1) but denies symptoms. Adam [name] states he has not had anymore ringing in his ears and denies fatigue this week. The allergic reaction requiring IV intervention is considered a grade 3 event.  Event Grade Onset Date End Date Status Comments Attribution to Cisplatin Attribution to Etoposide  Fatigue 1 03/22/2020 04/04/2020 Resolved Relieved by rest X   Unrelated o Unlikely o Possible o Probable o Definite  X   Unrelated o Unlikely o Possible o Probable o Definite   Tinnitus 1 03/29/2020 04/04/2020 Resolved No intervention required X   Unrelated o Unlikely o Possible o Probable o Definite  X   Unrelated o Unlikely o Possible o Probable o Definite   Decreased white blood cell (WBC) count 3 03/29/2020 04/04/2020 Resolved No intervention required o Unrelated o Unlikely o Possible o Probable X   Definite  o Unrelated o Unlikely o Possible o Probable X   Definite   Decreased neutrophil count 4 03/29/2020 04/04/2020 Resolved No intervention required o Unrelated o Unlikely o Possible o Probable X   Definite  o Unrelated o Unlikely o Possible o Probable X   Definite   Allergic reaction 3 03/30/2020 04/04/2020 Resolved IV Decadron & PO prednisone taper X   Unrelated o Unlikely o Possible o Probable o Definite  X   Unrelated o Unlikely o Possible o Probable o Definite   Sinus tachycardia 1 04/04/2020  Ongoing No intervention required o Unrelated X   Unlikely o Possible o Probable o Definite  o Unrelated X   Unlikely o Possible o Probable o Definite  Current plan is for Adam [name] to return to the cancer center tomorrow to continue daily RT and initiate on-study cycle 2 treatment. Next MD/lab visit is scheduled for 04/12/2020.   Adam [name], BSN, RN, CIC 04/04/2020 10:03 AM

## 2020-04-04 NOTE — Telephone Encounter (Signed)
Scheduled appt per 3/28 sch msg. Pt aware.  

## 2020-04-04 NOTE — Assessment & Plan Note (Signed)
Small cell lung cancer, limited stage  LIMITED STAGE SMALL CELL LUNG CANCER (LS-SCLC): A PHASE III  RANDOMIZED STUDY OF CHEMORADIATION VERSUS CHEMORADIATION PLUS ATEZOLIZUMAB (10-June-2019)  C1D1 cis/etoposide completed on oh 2.10.22. C2D1 completed on 03/15/2020 through 03/17/2020  He is here for follow-up per research protocol. He is doing very well except for some lip swelling, treated with short course of prednisone, not thought to be related to treatment. This has now resolved. PE, sinus tachycardia, mild otherwise unremarkable, Will add TSH, no other possible etiologies for sinus tachycardia. No concern for pneumonitis, pericarditis, esophagitis, encephalitis. Labs from today, satisfactory to proceed. Anticipate C3 of chemotherapy D1 tomorrow.  Benay Pike MD

## 2020-04-05 ENCOUNTER — Other Ambulatory Visit: Payer: Self-pay | Admitting: Hematology and Oncology

## 2020-04-05 ENCOUNTER — Ambulatory Visit
Admission: RE | Admit: 2020-04-05 | Discharge: 2020-04-05 | Disposition: A | Discharge status: Home / Self Care | Payer: 59 | Source: Ambulatory Visit | Attending: Radiation Oncology | Admitting: Radiation Oncology

## 2020-04-05 ENCOUNTER — Inpatient Hospital Stay: Payer: 59

## 2020-04-05 VITALS — BP 121/82 | HR 93 | Temp 98.1°F | Resp 16

## 2020-04-05 DIAGNOSIS — C3411 Malignant neoplasm of upper lobe, right bronchus or lung: Secondary | ICD-10-CM

## 2020-04-05 DIAGNOSIS — Z510 Encounter for antineoplastic radiation therapy: Secondary | ICD-10-CM | POA: Diagnosis not present

## 2020-04-05 LAB — CMP (CANCER CENTER ONLY)
ALT: 17 U/L (ref 0–44)
AST: 11 U/L — ABNORMAL LOW (ref 15–41)
Albumin: 3.1 g/dL — ABNORMAL LOW (ref 3.5–5.0)
Alkaline Phosphatase: 63 U/L (ref 38–126)
Anion gap: 12 (ref 5–15)
BUN: 14 mg/dL (ref 8–23)
CO2: 26 mmol/L (ref 22–32)
Calcium: 9.1 mg/dL (ref 8.9–10.3)
Chloride: 102 mmol/L (ref 98–111)
Creatinine: 0.97 mg/dL (ref 0.61–1.24)
GFR, Estimated: >60 mL/min (ref >60)
Glucose, Bld: 100 mg/dL — ABNORMAL HIGH (ref 70–99)
Potassium: 3.7 mmol/L (ref 3.5–5.1)
Sodium: 140 mmol/L (ref 135–145)
Total Bilirubin: 0.3 mg/dL (ref 0.3–1.2)
Total Protein: 7 g/dL (ref 6.5–8.1)

## 2020-04-05 LAB — CBC WITH DIFFERENTIAL (CANCER CENTER ONLY)
Abs Immature Granulocytes: 0.44 10*3/uL — ABNORMAL HIGH (ref 0.00–0.07)
Basophils Absolute: 0.1 10*3/uL (ref 0.0–0.1)
Basophils Relative: 1 %
Eosinophils Absolute: 0.1 10*3/uL (ref 0.0–0.5)
Eosinophils Relative: 1 %
HCT: 35.2 % — ABNORMAL LOW (ref 39.0–52.0)
Hemoglobin: 11.2 g/dL — ABNORMAL LOW (ref 13.0–17.0)
Immature Granulocytes: 7 %
Lymphocytes Relative: 19 %
Lymphs Abs: 1.2 10*3/uL (ref 0.7–4.0)
MCH: 26.5 pg (ref 26.0–34.0)
MCHC: 31.8 g/dL (ref 30.0–36.0)
MCV: 83.4 fL (ref 80.0–100.0)
Monocytes Absolute: 2.3 10*3/uL — ABNORMAL HIGH (ref 0.1–1.0)
Monocytes Relative: 37 %
Neutro Abs: 2.2 10*3/uL (ref 1.7–7.7)
Neutrophils Relative %: 35 %
Platelet Count: 381 10*3/uL (ref 150–400)
RBC: 4.22 MIL/uL (ref 4.22–5.81)
RDW: 17.6 % — ABNORMAL HIGH (ref 11.5–15.5)
WBC Count: 6.3 10*3/uL (ref 4.0–10.5)
nRBC: 0 % (ref 0.0–0.2)

## 2020-04-05 LAB — TSH: TSH: 1.154 u[IU]/mL (ref 0.320–4.118)

## 2020-04-05 MED ORDER — SODIUM CHLORIDE 0.9 % IV SOLN
60.0000 mg/m2 | Freq: Once | INTRAVENOUS | Status: AC
  Administered 2020-04-05: 120 mg via INTRAVENOUS
  Filled 2020-04-05: qty 120

## 2020-04-05 MED ORDER — MAGNESIUM SULFATE 2 GM/50ML IV SOLN
INTRAVENOUS | Status: AC
  Filled 2020-04-05: qty 50

## 2020-04-05 MED ORDER — MAGNESIUM SULFATE 2 GM/50ML IV SOLN
2.0000 g | Freq: Once | INTRAVENOUS | Status: AC
  Administered 2020-04-05: 2 g via INTRAVENOUS

## 2020-04-05 MED ORDER — SODIUM CHLORIDE 0.9 % IV SOLN
Freq: Once | INTRAVENOUS | Status: AC
  Filled 2020-04-05: qty 250

## 2020-04-05 MED ORDER — SODIUM CHLORIDE 0.9 % IV SOLN
150.0000 mg | Freq: Once | INTRAVENOUS | Status: AC
  Administered 2020-04-05: 150 mg via INTRAVENOUS
  Filled 2020-04-05: qty 5

## 2020-04-05 MED ORDER — PALONOSETRON HCL INJECTION 0.25 MG/5ML
0.2500 mg | Freq: Once | INTRAVENOUS | Status: AC
  Administered 2020-04-05: 0.25 mg via INTRAVENOUS

## 2020-04-05 MED ORDER — PALONOSETRON HCL INJECTION 0.25 MG/5ML
INTRAVENOUS | Status: AC
  Filled 2020-04-05: qty 5

## 2020-04-05 MED ORDER — SODIUM CHLORIDE 0.9 % IV SOLN
120.0000 mg/m2 | Freq: Once | INTRAVENOUS | Status: AC
  Administered 2020-04-05: 240 mg via INTRAVENOUS
  Filled 2020-04-05: qty 12

## 2020-04-05 MED ORDER — POTASSIUM CHLORIDE IN NACL 20-0.9 MEQ/L-% IV SOLN
Freq: Once | INTRAVENOUS | Status: AC
  Filled 2020-04-05: qty 1000

## 2020-04-05 MED ORDER — SODIUM CHLORIDE 0.9 % IV SOLN
10.0000 mg | Freq: Once | INTRAVENOUS | Status: AC
  Administered 2020-04-05: 10 mg via INTRAVENOUS
  Filled 2020-04-05 (×2): qty 1

## 2020-04-05 NOTE — Patient Instructions (Signed)
[facility] [facility] Discharge Instructions for Patients Receiving Chemotherapy  Today you received the following chemotherapy agents: Cisplatin and Etoposide.  To help prevent nausea and vomiting after your treatment, we encourage you to take your nausea medication as directed by your MD.   If you develop nausea and vomiting that is not controlled by your nausea medication, call the clinic.   BELOW ARE SYMPTOMS THAT SHOULD BE REPORTED IMMEDIATELY:  *FEVER GREATER THAN 100.5 F  *CHILLS WITH OR WITHOUT FEVER  NAUSEA AND VOMITING THAT IS NOT CONTROLLED WITH YOUR NAUSEA MEDICATION  *UNUSUAL SHORTNESS OF BREATH  *UNUSUAL BRUISING OR BLEEDING  TENDERNESS IN MOUTH AND THROAT WITH OR WITHOUT PRESENCE OF ULCERS  *URINARY PROBLEMS  *BOWEL PROBLEMS  UNUSUAL RASH Items with * indicate a potential emergency and should be followed up as soon as possible.  Feel free to call the clinic should you have any questions or concerns. The clinic phone number is [phone].  Please show the [place] at check-in to the Emergency Department and triage nurse.

## 2020-04-06 ENCOUNTER — Ambulatory Visit
Admission: RE | Admit: 2020-04-06 | Discharge: 2020-04-06 | Disposition: A | Discharge status: Home / Self Care | Payer: 59 | Source: Ambulatory Visit | Attending: Radiation Oncology | Admitting: Radiation Oncology

## 2020-04-06 ENCOUNTER — Inpatient Hospital Stay: Payer: 59

## 2020-04-06 ENCOUNTER — Other Ambulatory Visit: Payer: Self-pay

## 2020-04-06 VITALS — BP 154/87 | HR 93 | Temp 97.8°F | Resp 16

## 2020-04-06 DIAGNOSIS — C3411 Malignant neoplasm of upper lobe, right bronchus or lung: Secondary | ICD-10-CM

## 2020-04-06 DIAGNOSIS — Z510 Encounter for antineoplastic radiation therapy: Secondary | ICD-10-CM | POA: Diagnosis not present

## 2020-04-06 MED ORDER — SODIUM CHLORIDE 0.9 % IV SOLN
120.0000 mg/m2 | Freq: Once | INTRAVENOUS | Status: AC
  Administered 2020-04-06: 240 mg via INTRAVENOUS
  Filled 2020-04-06: qty 12

## 2020-04-06 MED ORDER — SODIUM CHLORIDE 0.9 % IV SOLN
10.0000 mg | Freq: Once | INTRAVENOUS | Status: AC
  Administered 2020-04-06: 10 mg via INTRAVENOUS
  Filled 2020-04-06: qty 10

## 2020-04-06 MED ORDER — SODIUM CHLORIDE 0.9 % IV SOLN
Freq: Once | INTRAVENOUS | Status: AC
  Filled 2020-04-06: qty 250

## 2020-04-06 NOTE — Patient Instructions (Signed)
[facility] Discharge Instructions for Patients Receiving Chemotherapy  Today you received the following chemotherapy agent: Etoposide  To help prevent nausea and vomiting after your treatment, we encourage you to take your nausea medication as directed by your MD.   If you develop nausea and vomiting that is not controlled by your nausea medication, call the clinic.   BELOW ARE SYMPTOMS THAT SHOULD BE REPORTED IMMEDIATELY:  *FEVER GREATER THAN 100.5 F  *CHILLS WITH OR WITHOUT FEVER  NAUSEA AND VOMITING THAT IS NOT CONTROLLED WITH YOUR NAUSEA MEDICATION  *UNUSUAL SHORTNESS OF BREATH  *UNUSUAL BRUISING OR BLEEDING  TENDERNESS IN MOUTH AND THROAT WITH OR WITHOUT PRESENCE OF ULCERS  *URINARY PROBLEMS  *BOWEL PROBLEMS  UNUSUAL RASH Items with * indicate a potential emergency and should be followed up as soon as possible.  Feel free to call the clinic should you have any questions or concerns. The clinic phone number is [phone].  Please show the CHEMO ALERT CARD at check-in to the Emergency Department and triage nurse.   

## 2020-04-07 ENCOUNTER — Ambulatory Visit
Admission: RE | Admit: 2020-04-07 | Discharge: 2020-04-07 | Disposition: A | Discharge status: Home / Self Care | Payer: 59 | Source: Ambulatory Visit | Attending: Radiation Oncology | Admitting: Radiation Oncology

## 2020-04-07 ENCOUNTER — Inpatient Hospital Stay: Payer: 59

## 2020-04-07 VITALS — BP 138/92 | HR 93 | Temp 97.5°F | Resp 16

## 2020-04-07 DIAGNOSIS — Z510 Encounter for antineoplastic radiation therapy: Secondary | ICD-10-CM | POA: Diagnosis not present

## 2020-04-07 DIAGNOSIS — C3411 Malignant neoplasm of upper lobe, right bronchus or lung: Secondary | ICD-10-CM

## 2020-04-07 MED ORDER — SODIUM CHLORIDE 0.9 % IV SOLN
Freq: Once | INTRAVENOUS | Status: AC
  Filled 2020-04-07: qty 250

## 2020-04-07 MED ORDER — SODIUM CHLORIDE 0.9 % IV SOLN
120.0000 mg/m2 | Freq: Once | INTRAVENOUS | Status: AC
  Administered 2020-04-07: 240 mg via INTRAVENOUS
  Filled 2020-04-07: qty 12

## 2020-04-07 MED ORDER — SODIUM CHLORIDE 0.9 % IV SOLN
10.0000 mg | Freq: Once | INTRAVENOUS | Status: AC
  Administered 2020-04-07: 10 mg via INTRAVENOUS
  Filled 2020-04-07: qty 10

## 2020-04-07 NOTE — Patient Instructions (Signed)
[facility] Discharge Instructions for Patients Receiving Chemotherapy  Today you received the following chemotherapy agent: Etoposide  To help prevent nausea and vomiting after your treatment, we encourage you to take your nausea medication as directed by your MD.   If you develop nausea and vomiting that is not controlled by your nausea medication, call the clinic.   BELOW ARE SYMPTOMS THAT SHOULD BE REPORTED IMMEDIATELY:  *FEVER GREATER THAN 100.5 F  *CHILLS WITH OR WITHOUT FEVER  NAUSEA AND VOMITING THAT IS NOT CONTROLLED WITH YOUR NAUSEA MEDICATION  *UNUSUAL SHORTNESS OF BREATH  *UNUSUAL BRUISING OR BLEEDING  TENDERNESS IN MOUTH AND THROAT WITH OR WITHOUT PRESENCE OF ULCERS  *URINARY PROBLEMS  *BOWEL PROBLEMS  UNUSUAL RASH Items with * indicate a potential emergency and should be followed up as soon as possible.  Feel free to call the clinic should you have any questions or concerns. The clinic phone number is [phone].  Please show the CHEMO ALERT CARD at check-in to the Emergency Department and triage nurse.   

## 2020-04-08 ENCOUNTER — Other Ambulatory Visit: Payer: Self-pay

## 2020-04-08 ENCOUNTER — Ambulatory Visit
Admission: RE | Admit: 2020-04-08 | Discharge: 2020-04-08 | Disposition: A | Discharge status: Home / Self Care | Payer: 59 | Source: Ambulatory Visit | Attending: Radiation Oncology | Admitting: Radiation Oncology

## 2020-04-08 DIAGNOSIS — Z5111 Encounter for antineoplastic chemotherapy: Secondary | ICD-10-CM | POA: Diagnosis not present

## 2020-04-08 DIAGNOSIS — Z510 Encounter for antineoplastic radiation therapy: Secondary | ICD-10-CM | POA: Insufficient documentation

## 2020-04-08 DIAGNOSIS — C3411 Malignant neoplasm of upper lobe, right bronchus or lung: Secondary | ICD-10-CM | POA: Insufficient documentation

## 2020-04-08 DIAGNOSIS — Z006 Encounter for examination for normal comparison and control in clinical research program: Secondary | ICD-10-CM | POA: Diagnosis present

## 2020-04-11 ENCOUNTER — Ambulatory Visit
Admission: RE | Admit: 2020-04-11 | Discharge: 2020-04-11 | Disposition: A | Discharge status: Home / Self Care | Payer: 59 | Source: Ambulatory Visit | Attending: Radiation Oncology | Admitting: Radiation Oncology

## 2020-04-11 DIAGNOSIS — Z5111 Encounter for antineoplastic chemotherapy: Secondary | ICD-10-CM | POA: Diagnosis not present

## 2020-04-12 ENCOUNTER — Inpatient Hospital Stay: Payer: 59 | Attending: Hematology and Oncology

## 2020-04-12 ENCOUNTER — Encounter: Payer: Self-pay | Admitting: Hematology and Oncology

## 2020-04-12 ENCOUNTER — Ambulatory Visit
Admission: RE | Admit: 2020-04-12 | Discharge: 2020-04-12 | Disposition: A | Discharge status: Home / Self Care | Payer: 59 | Source: Ambulatory Visit | Attending: Radiation Oncology | Admitting: Radiation Oncology

## 2020-04-12 ENCOUNTER — Inpatient Hospital Stay (HOSPITAL_BASED_OUTPATIENT_CLINIC_OR_DEPARTMENT_OTHER): Payer: 59 | Admitting: Hematology and Oncology

## 2020-04-12 ENCOUNTER — Other Ambulatory Visit: Payer: Self-pay

## 2020-04-12 DIAGNOSIS — Z5111 Encounter for antineoplastic chemotherapy: Secondary | ICD-10-CM | POA: Diagnosis not present

## 2020-04-12 DIAGNOSIS — Z510 Encounter for antineoplastic radiation therapy: Secondary | ICD-10-CM | POA: Insufficient documentation

## 2020-04-12 DIAGNOSIS — Z006 Encounter for examination for normal comparison and control in clinical research program: Secondary | ICD-10-CM | POA: Diagnosis not present

## 2020-04-12 DIAGNOSIS — C3411 Malignant neoplasm of upper lobe, right bronchus or lung: Secondary | ICD-10-CM

## 2020-04-12 LAB — CMP (CANCER CENTER ONLY)
ALT: 17 U/L (ref 0–44)
AST: 13 U/L — ABNORMAL LOW (ref 15–41)
Albumin: 3.4 g/dL — ABNORMAL LOW (ref 3.5–5.0)
Alkaline Phosphatase: 73 U/L (ref 38–126)
Anion gap: 12 (ref 5–15)
BUN: 19 mg/dL (ref 8–23)
CO2: 28 mmol/L (ref 22–32)
Calcium: 9.7 mg/dL (ref 8.9–10.3)
Chloride: 101 mmol/L (ref 98–111)
Creatinine: 1.09 mg/dL (ref 0.61–1.24)
GFR, Estimated: >60 mL/min (ref >60)
Glucose, Bld: 113 mg/dL — ABNORMAL HIGH (ref 70–99)
Potassium: 3.8 mmol/L (ref 3.5–5.1)
Sodium: 141 mmol/L (ref 135–145)
Total Bilirubin: 0.3 mg/dL (ref 0.3–1.2)
Total Protein: 7.3 g/dL (ref 6.5–8.1)

## 2020-04-12 LAB — CBC WITH DIFFERENTIAL (CANCER CENTER ONLY)
Abs Immature Granulocytes: 0.1 10*3/uL — ABNORMAL HIGH (ref 0.00–0.07)
Basophils Absolute: 0.1 10*3/uL (ref 0.0–0.1)
Basophils Relative: 2 %
Eosinophils Absolute: 0 10*3/uL (ref 0.0–0.5)
Eosinophils Relative: 1 %
HCT: 36.6 % — ABNORMAL LOW (ref 39.0–52.0)
Hemoglobin: 11.8 g/dL — ABNORMAL LOW (ref 13.0–17.0)
Immature Granulocytes: 4 %
Lymphocytes Relative: 18 %
Lymphs Abs: 0.5 10*3/uL — ABNORMAL LOW (ref 0.7–4.0)
MCH: 26.3 pg (ref 26.0–34.0)
MCHC: 32.2 g/dL (ref 30.0–36.0)
MCV: 81.7 fL (ref 80.0–100.0)
Monocytes Absolute: 0.1 10*3/uL (ref 0.1–1.0)
Monocytes Relative: 2 %
Neutro Abs: 2.1 10*3/uL (ref 1.7–7.7)
Neutrophils Relative %: 73 %
Platelet Count: 238 10*3/uL (ref 150–400)
RBC: 4.48 MIL/uL (ref 4.22–5.81)
RDW: 17 % — ABNORMAL HIGH (ref 11.5–15.5)
WBC Count: 2.8 10*3/uL — ABNORMAL LOW (ref 4.0–10.5)
nRBC: 0 % (ref 0.0–0.2)

## 2020-04-12 LAB — TSH: TSH: 0.868 u[IU]/mL (ref 0.320–4.118)

## 2020-04-12 NOTE — Research (Signed)
[id number] TESTING THE ADDITION OF ATEZOLIZUMAB TO THE USUAL CHEMORADIATION TREATMENT FOR LIMITED STAGE SMALL CELL LUNG CANCER  Talton arrives [place] today for on-study RT, on-study Cycle 2/Day 8 labs and MD visit.  PROs: PRO questionnaires are completed upon arrival. Forms are checked for completeness. Bear does report one brief episode of ringing in his ears on 04/10/20 that lasted approximately ten seconds. He also reports mild fatigue again this week that resolves with rest (started after his last treatment was complete).  RT: Rohin completes his daily RT without complaint.   LABS: Mandatory study labs are collected per study protocol and Maki tolerates the procedure well without complaint.  VITAL SIGNS: Vital signs are collected per protocol. Pulse rate is below 100bpm today.    MEDICATION REVIEW: Daeshaun reviews the current med list and verifies the list as correct.   MD VISIT: Sumit sees Dr. Chryl Heck today and he denies any complaints other than one episode of hiccups following his last treatment (04/08/20) that resolved after taking an Alka-Seltzer at home. Dr. Chryl Heck will discontinue the IV dexamethasone since Ottis is not having any nausea or vomiting and may be contributing to the hiccups.  ADVERSE EVENTS: Ryun has a baseline history of hypertension and anemia, both remain grade 1 per CTCAE 5.0. His cough remains resolved.   Today's lab results reveal grade 1 hyperglycemia, grade 1 hypoalbuminemia and grade 1 anemia: none are considered clinically significant. Matei also has grade 2 reduction in WBCs (these will be reported for the next visit).  Event Grade Onset Date End Date Status Comments Attribution to Cisplatin Attribution to Etoposide  Fatigue 1 03/22/2020 04/04/2020 Resolved Relieved by rest X   Unrelated o Unlikely o Possible o Probable o Definite  X   Unrelated o Unlikely o Possible o Probable o Definite   Tinnitus 1 03/29/2020 04/04/2020 Resolved No intervention  required X   Unrelated o Unlikely o Possible o Probable o Definite  X   Unrelated o Unlikely o Possible o Probable o Definite   Decreased white blood cell (WBC) count 3 03/29/2020 04/04/2020 Resolved No intervention required o Unrelated o Unlikely o Possible o Probable X   Definite  o Unrelated o Unlikely o Possible o Probable X   Definite   Decreased neutrophil count 4 03/29/2020 04/04/2020 Resolved No intervention required o Unrelated o Unlikely o Possible o Probable X   Definite  o Unrelated o Unlikely o Possible o Probable X   Definite   Allergic reaction 3 03/30/2020 04/04/2020 Resolved IV Decadron & PO prednisone taper X   Unrelated o Unlikely o Possible o Probable o Definite  X   Unrelated o Unlikely o Possible o Probable o Definite   Sinus tachycardia 1 04/04/2020 04/05/2020 Resolved No intervention required o Unrelated X   Unlikely o Possible o Probable o Definite  o Unrelated X   Unlikely o Possible o Probable o Definite   Hiccups 1 04/08/2020 04/08/2020 Resolved  Relieved by Alka-Seltzer X   Unrelated o Unlikely o Possible o Probable o Definite  X   Unrelated o Unlikely o Possible o Probable o Definite     Current plan is for Adam [name] to return to the cancer center tomorrow to continue daily RT. Next MD/lab visit is scheduled for 04/21/2020.   [name]. Sharlett Iles, BSN, RN, CIC 04/12/2020 10:38 AM

## 2020-04-12 NOTE — Assessment & Plan Note (Signed)
Small cell lung cancer, limited stage  LIMITED STAGE SMALL CELL LUNG CANCER (LS-SCLC): A PHASE III  RANDOMIZED STUDY OF CHEMORADIATION VERSUS CHEMORADIATION PLUS ATEZOLIZUMAB (10-June-2019)  C1D1 cis/etoposide completed on [place] 2.10.22. C2D1 completed on 03/15/2020 through 03/17/2020  He is here for follow-up per research protocol. He is doing very well except for some chest pain described as gas, hiccups and some reflux. PE, sinus tachycardia, mild otherwise unremarkable, No concern for pneumonitis, pericarditis, esophagitis, encephalitis. His hiccups, chest pain relieved by alka seltzer and reflex are likely from dexamethasone pre medication. Will eliminate it for next cycle. He was instructed to give [place] a call if he has any new symptoms He will also try prilosec for these symptoms. Benay Pike MD

## 2020-04-12 NOTE — Progress Notes (Signed)
[facility] CONSULT NOTE  Patient Care Team: Patient, No Pcp Per (Inactive) as PCP - General ([facility]) Adam Kaufman, RN as Registered Nurse  CHIEF COMPLAINTS/PURPOSE OF CONSULTATION:  Limited stage small cell lung cancer.  ASSESSMENT & PLAN:  Small cell lung cancer, 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HCC) Small cell lung cancer, limited stage  LIMITED STAGE SMALL CELL LUNG CANCER (LS-SCLC): A PHASE III  RANDOMIZED STUDY OF CHEMORADIATION VERSUS CHEMORADIATION PLUS ATEZOLIZUMAB (10-June-2019)  C1D1 cis/etoposide completed on [place] 2.10.22. C2D1 completed on 03/15/2020 through 03/17/2020  He is here for follow-up per research protocol. He is doing very well except for some chest pain described as gas, hiccups and some reflux. PE, sinus tachycardia, mild otherwise unremarkable, No concern for pneumonitis, pericarditis, esophagitis, encephalitis. His hiccups, chest pain relieved by alka seltzer and reflex are likely from dexamethasone pre medication. Will eliminate it for next cycle. He was instructed to give [place] a call if he has any new symptoms He will also try prilosec for these symptoms. Benay Pike MD   No orders of the defined types were placed in this encounter.  Thank you for consulting [place] in the care of this patient.  Please not hesitate to contact us with any additional questions or concerns.  HISTORY OF PRESENTING ILLNESS:   Adam [name] 65 y.o. male is here because of new diagnosis of limited stage small cell lung cancer.  Adam [name] 65 y.o. male is here because of hemoptysis  This is a 65 yr old male patient with PMH significant for HTN presented with chief complaint of hemoptysis. He had a dental extraction 3 months ago and soon after noticed scant hemoptysis which he attributed to the dental extraction. He then had two more episodes of scant hemoptysis, he describes it as pea size in his sputum. He may have noticed worsening SOB in the past 3  months but not significant. He lost about 10 lbs of weight in the past several months. He denies any chest pain or worsening cough. No other complaints. No change in bowel or urinary habits. He smoked 20 PPY, quit about 2 yrs ago. He worked as a Probation officer, [employer] park manager.  CT chest 02/06/2020 showed right upper lobe mass lesion with associated large nodal mass involving the right suprahilar region and mediastinum in the paratracheal region consistent with primary pulmonary neoplasm and metastatic nodal involvement. CT abdomen pelvis from 02/07/2020 showed an 8 mm nonobstructing calculus in the left kidney, small umbilical hernia, bubbly mixed lytic and sclerotic lesion within the right femoral head of uncertain acuity could represent a small enchondroma, metastatic is considered less likely but not entirely excluded. MRI brain from 02/07/2020 showed single polypoid focus of susceptibility artifact and faint nodular enhancement within the central pons.  Findings favored to reflect a small vascular structure short interval follow-up in 32-month suggested to document stability.  No other MRI evidence of intracranial metastatic disease. Cytology from bronchoscopy showed malignant cells consistent with small cell carcinoma in the 4R lymph node.  He is an excellent candidate for the following trial.  LIMITED STAGE SMALL CELL LUNG CANCER (LS-SCLC): A PHASE III  RANDOMIZED STUDY OF CHEMORADIATION VERSUS CHEMORADIATION PLUS ATEZOLIZUMAB (10-June-2019)  C1 D1 02/16/2020-02/18/2020 He has consented for the trial and is enrolled to the arm without immunotherapy. C2 D1-D3 03/15/2020-03/17/2020  Interim History  He is here for weekly follow up Last week he had a couple episodes of chest pain. He describes this as gas and alka  seltzer will take care of it. He also had some hiccups after his treatment. He felt like he needed to prop up his head to feel more comfortable since he may have had some reflux. No  other complaints. Cough has eased up. NO SOB. He walks about 2 miles a day. No change in bowel habits No change in urinary habits or neurological complaints. No headaches, difficulty swallowing. Rest of the pertinent 10 point ROS reviewed and negative.  REVIEW OF SYSTEMS:    Constitutional: Denies fevers, chills or abnormal night sweats Eyes: Denies blurriness of vision, double vision or watery eyes Ears, nose, mouth, throat, and face: Denies mucositis or sore throat Respiratory: Denies cough, dyspnea or wheezes Cardiovascular: Denies palpitation, chest discomfort or lower extremity swelling Gastrointestinal:  Denies nausea, heartburn or change in bowel habits Skin: Denies abnormal skin rashes Lymphatics: Denies new lymphadenopathy or easy bruising Neurological:Denies numbness, tingling or new weaknesses Behavioral/Psych: Mood is stable, no new changes  All other systems were reviewed with the patient and are negative.  MEDICAL HISTORY:  Past Medical History:  Diagnosis Date  . Hypertension   . Lung cancer ([place])    small cell RUL    SURGICAL HISTORY: Past Surgical History:  Procedure Laterality Date  . BRONCHIAL BRUSHINGS  02/08/2020   Procedure: BRONCHIAL BRUSHINGS;  Surgeon: Freddi Starr, MD;  Location: [facility];  Service: Pulmonary;;  . BRONCHIAL NEEDLE ASPIRATION BIOPSY  02/08/2020   Procedure: BRONCHIAL NEEDLE ASPIRATION BIOPSIES;  Surgeon: Freddi Starr, MD;  Location: [facility];  Service: Pulmonary;;  . KNEE SURGERY Right   . SHOULDER SURGERY Left   . VIDEO BRONCHOSCOPY WITH ENDOBRONCHIAL ULTRASOUND N/A 02/08/2020   Procedure: VIDEO BRONCHOSCOPY WITH ENDOBRONCHIAL ULTRASOUND;  Surgeon: Freddi Starr, MD;  Location: [facility];  Service: Pulmonary;  Laterality: N/A;    SOCIAL HISTORY: Social History   Socioeconomic History  . Marital status: Single    Spouse name: [name]  . Number of children: Not on file  . Years of education: Not on file   . Highest education level: Not on file  Occupational History  . Not on file  Tobacco Use  . Smoking status: Former Smoker    Packs/day: 1.00    Years: 20.00    Pack years: 20.00    Types: Cigarettes    Quit date: 06/09/2018    Years since quitting: 1.8  . Smokeless tobacco: Never Used  Vaping Use  . Vaping Use: Never used  Substance and Sexual Activity  . Alcohol use: Not Currently  . Drug use: Never  . Sexual activity: Yes  Other Topics Concern  . Not on file  Social History Narrative  . Not on file   Social Determinants of Health   Financial Resource Strain: Not on file  Food Insecurity: Not on file  Transportation Needs: Not on file  Physical Activity: Not on file  Stress: Not on file  Social Connections: Not on file  Intimate Partner Violence: Not on file    FAMILY HISTORY: Family History  Problem Relation Age of Onset  . Heart disease Mother   . Cancer Cousin   . Breast cancer Neg Hx   . Colon cancer Neg Hx   . Pancreatic cancer Neg Hx   . Prostate cancer Neg Hx     ALLERGIES:  has No Known Allergies.  MEDICATIONS:  Current Outpatient Medications  Medication Sig Dispense Refill  . amLODipine (NORVASC) 10 MG tablet Take 10 mg by mouth daily.    Marland Kitchen  ibuprofen (ADVIL) 600 MG tablet Take 600 mg by mouth every 6 (six) hours as needed. (Patient not taking: No sig reported)    . lisinopril-hydrochlorothiazide (ZESTORETIC) 20-12.5 MG tablet Take 1 tablet by mouth daily.    . predniSONE (DELTASONE) 5 MG tablet 6 tab x 2 days, 5 tab x 2 days, 4 tab x 2 days, 3 tab x 2 days, 2 tab x 2 days, 1 tab x 2 days, stop 42 tablet 0   No current facility-administered medications for this visit.    PHYSICAL EXAMINATION:  ECOG PERFORMANCE STATUS: 0 - Asymptomatic  Vitals:   04/12/20 0845  BP: (!) 141/88  Pulse: 95  Resp: 20  Temp: (!) 97 F (36.1 C)  SpO2: 100%   Filed Weights   04/12/20 0845  Weight: 185 lb (83.9 kg)    GENERAL:alert, no distress and  comfortable,  SKIN: skin color, texture, turgor are normal, no rashes or significant lesions EYES: normal, conjunctiva are pink and non-injected, sclera clear OROPHARYNX:no exudate, no erythema and lips, buccal mucosa, and tongue normal  NECK: supple, thyroid normal size, non-tender, without nodularity LYMPH:  no palpable lymphadenopathy in the cervical, axillary or inguinal LUNGS: clear to auscultation and percussion with normal breathing effort HEART: Sinus tachycardia, no murmurs, no lower extremity edema.   ABDOMEN:abdomen soft, non-tender and normal bowel sounds Musculoskeletal:no cyanosis of digits and no clubbing  PSYCH: alert & oriented x 3 with fluent speech NEURO: no focal motor/sensory deficits . LABORATORY DATA:  I have reviewed the data as listed Lab Results  Component Value Date   WBC 2.8 (L) 04/12/2020   HGB 11.8 (L) 04/12/2020   HCT 36.6 (L) 04/12/2020   MCV 81.7 04/12/2020   PLT 238 04/12/2020     Chemistry      Component Value Date/Time   NA 141 04/12/2020 0828   K 3.8 04/12/2020 0828   CL 101 04/12/2020 0828   CO2 28 04/12/2020 0828   BUN 19 04/12/2020 0828   CREATININE 1.09 04/12/2020 0828      Component Value Date/Time   CALCIUM 9.7 04/12/2020 0828   ALKPHOS 73 04/12/2020 0828   AST 13 (L) 04/12/2020 0828   ALT 17 04/12/2020 0828   BILITOT 0.3 04/12/2020 0828      RADIOGRAPHIC STUDIES: I have personally reviewed the radiological images as listed and agreed with the findings in the report.  No results found.  All questions were answered. The patient knows to call the clinic with any problems, questions or concerns. I spent 30 minutes in the care of this patient including H and P, review of records, counseling and coordination of care.     Benay Pike, MD 04/12/2020 9:37 AM

## 2020-04-13 ENCOUNTER — Ambulatory Visit
Admission: RE | Admit: 2020-04-13 | Discharge: 2020-04-13 | Disposition: A | Discharge status: Home / Self Care | Payer: 59 | Source: Ambulatory Visit | Attending: Radiation Oncology | Admitting: Radiation Oncology

## 2020-04-13 DIAGNOSIS — Z5111 Encounter for antineoplastic chemotherapy: Secondary | ICD-10-CM | POA: Diagnosis not present

## 2020-04-14 ENCOUNTER — Ambulatory Visit
Admission: RE | Admit: 2020-04-14 | Discharge: 2020-04-14 | Disposition: A | Discharge status: Home / Self Care | Payer: 59 | Source: Ambulatory Visit | Attending: Radiation Oncology | Admitting: Radiation Oncology

## 2020-04-14 DIAGNOSIS — Z5111 Encounter for antineoplastic chemotherapy: Secondary | ICD-10-CM | POA: Diagnosis not present

## 2020-04-15 ENCOUNTER — Other Ambulatory Visit: Payer: Self-pay

## 2020-04-15 ENCOUNTER — Ambulatory Visit
Admission: RE | Admit: 2020-04-15 | Discharge: 2020-04-15 | Disposition: A | Discharge status: Home / Self Care | Payer: 59 | Source: Ambulatory Visit | Attending: Radiation Oncology | Admitting: Radiation Oncology

## 2020-04-15 DIAGNOSIS — Z5111 Encounter for antineoplastic chemotherapy: Secondary | ICD-10-CM | POA: Diagnosis not present

## 2020-04-18 ENCOUNTER — Other Ambulatory Visit: Payer: Self-pay

## 2020-04-18 ENCOUNTER — Ambulatory Visit
Admission: RE | Admit: 2020-04-18 | Discharge: 2020-04-18 | Disposition: A | Discharge status: Home / Self Care | Payer: 59 | Source: Ambulatory Visit | Attending: Radiation Oncology | Admitting: Radiation Oncology

## 2020-04-18 DIAGNOSIS — Z5111 Encounter for antineoplastic chemotherapy: Secondary | ICD-10-CM | POA: Diagnosis not present

## 2020-04-19 ENCOUNTER — Other Ambulatory Visit: Payer: 59

## 2020-04-19 ENCOUNTER — Ambulatory Visit: Payer: 59 | Admitting: Hematology and Oncology

## 2020-04-19 ENCOUNTER — Ambulatory Visit
Admission: RE | Admit: 2020-04-19 | Discharge: 2020-04-19 | Disposition: A | Discharge status: Home / Self Care | Payer: 59 | Source: Ambulatory Visit | Attending: Radiation Oncology | Admitting: Radiation Oncology

## 2020-04-19 DIAGNOSIS — Z5111 Encounter for antineoplastic chemotherapy: Secondary | ICD-10-CM | POA: Diagnosis not present

## 2020-04-20 ENCOUNTER — Ambulatory Visit
Admission: RE | Admit: 2020-04-20 | Discharge: 2020-04-20 | Disposition: A | Discharge status: Home / Self Care | Payer: 59 | Source: Ambulatory Visit | Attending: Radiation Oncology | Admitting: Radiation Oncology

## 2020-04-20 ENCOUNTER — Other Ambulatory Visit: Payer: Self-pay

## 2020-04-20 DIAGNOSIS — Z5111 Encounter for antineoplastic chemotherapy: Secondary | ICD-10-CM | POA: Diagnosis not present

## 2020-04-21 ENCOUNTER — Other Ambulatory Visit: Payer: Self-pay

## 2020-04-21 ENCOUNTER — Inpatient Hospital Stay (HOSPITAL_BASED_OUTPATIENT_CLINIC_OR_DEPARTMENT_OTHER): Payer: 59 | Admitting: Hematology and Oncology

## 2020-04-21 ENCOUNTER — Inpatient Hospital Stay: Payer: 59

## 2020-04-21 ENCOUNTER — Ambulatory Visit: Payer: 59

## 2020-04-21 ENCOUNTER — Encounter: Payer: Self-pay | Admitting: Hematology and Oncology

## 2020-04-21 ENCOUNTER — Ambulatory Visit
Admission: RE | Admit: 2020-04-21 | Discharge: 2020-04-21 | Disposition: A | Discharge status: Home / Self Care | Payer: 59 | Source: Ambulatory Visit | Attending: Radiation Oncology | Admitting: Radiation Oncology

## 2020-04-21 ENCOUNTER — Encounter: Payer: Self-pay | Admitting: Medical Oncology

## 2020-04-21 DIAGNOSIS — Z5111 Encounter for antineoplastic chemotherapy: Secondary | ICD-10-CM | POA: Diagnosis not present

## 2020-04-21 DIAGNOSIS — Z006 Encounter for examination for normal comparison and control in clinical research program: Secondary | ICD-10-CM

## 2020-04-21 DIAGNOSIS — C3411 Malignant neoplasm of upper lobe, right bronchus or lung: Secondary | ICD-10-CM | POA: Diagnosis not present

## 2020-04-21 DIAGNOSIS — D701 Agranulocytosis secondary to cancer chemotherapy: Secondary | ICD-10-CM

## 2020-04-21 DIAGNOSIS — T451X5A Adverse effect of antineoplastic and immunosuppressive drugs, initial encounter: Secondary | ICD-10-CM | POA: Diagnosis not present

## 2020-04-21 LAB — CBC WITH DIFFERENTIAL (CANCER CENTER ONLY)
Abs Immature Granulocytes: 0 10*3/uL (ref 0.00–0.07)
Basophils Absolute: 0 10*3/uL (ref 0.0–0.1)
Basophils Relative: 1 %
Eosinophils Absolute: 0.2 10*3/uL (ref 0.0–0.5)
Eosinophils Relative: 17 %
HCT: 34.6 % — ABNORMAL LOW (ref 39.0–52.0)
Hemoglobin: 11.1 g/dL — ABNORMAL LOW (ref 13.0–17.0)
Immature Granulocytes: 0 %
Lymphocytes Relative: 32 %
Lymphs Abs: 0.4 10*3/uL — ABNORMAL LOW (ref 0.7–4.0)
MCH: 26.7 pg (ref 26.0–34.0)
MCHC: 32.1 g/dL (ref 30.0–36.0)
MCV: 83.2 fL (ref 80.0–100.0)
Monocytes Absolute: 0.4 10*3/uL (ref 0.1–1.0)
Monocytes Relative: 28 %
Neutro Abs: 0.3 10*3/uL — CL (ref 1.7–7.7)
Neutrophils Relative %: 22 %
Platelet Count: 140 10*3/uL — ABNORMAL LOW (ref 150–400)
RBC: 4.16 MIL/uL — ABNORMAL LOW (ref 4.22–5.81)
RDW: 18.1 % — ABNORMAL HIGH (ref 11.5–15.5)
WBC Count: 1.3 10*3/uL — ABNORMAL LOW (ref 4.0–10.5)
nRBC: 0 % (ref 0.0–0.2)

## 2020-04-21 LAB — CMP (CANCER CENTER ONLY)
ALT: 12 U/L (ref 0–44)
AST: 16 U/L (ref 15–41)
Albumin: 3.6 g/dL (ref 3.5–5.0)
Alkaline Phosphatase: 78 U/L (ref 38–126)
Anion gap: 10 (ref 5–15)
BUN: 13 mg/dL (ref 8–23)
CO2: 27 mmol/L (ref 22–32)
Calcium: 9.7 mg/dL (ref 8.9–10.3)
Chloride: 103 mmol/L (ref 98–111)
Creatinine: 0.97 mg/dL (ref 0.61–1.24)
GFR, Estimated: >60 mL/min (ref >60)
Glucose, Bld: 86 mg/dL (ref 70–99)
Potassium: 4 mmol/L (ref 3.5–5.1)
Sodium: 140 mmol/L (ref 135–145)
Total Bilirubin: 0.3 mg/dL (ref 0.3–1.2)
Total Protein: 7.2 g/dL (ref 6.5–8.1)

## 2020-04-21 LAB — TSH: TSH: 0.951 u[IU]/mL (ref 0.320–4.118)

## 2020-04-21 NOTE — Assessment & Plan Note (Signed)
Chemotherapy induced neutropenia, ANC of 0.3 We will repeat labs.  He understands neutropenic precautions.

## 2020-04-21 NOTE — Assessment & Plan Note (Signed)
Small cell lung cancer, limited stage  LIMITED STAGE SMALL CELL LUNG CANCER (LS-SCLC): A PHASE III  RANDOMIZED STUDY OF CHEMORADIATION VERSUS CHEMORADIATION PLUS ATEZOLIZUMAB (10-June-2019)  C1D1 cis/etoposide completed on [place] 2.10.22. C2D1 completed on 03/15/2020 through 03/17/2020 Cycle 3-day 1 completed on 04/05/2020 through 04/07/2020  He is here for follow-up per research protocol. He is doing very well except for one episode of constipation.  No other concerning review of systems.  Physical examination heart rate at the upper limit of normal, otherwise no concerns. No concern for pneumonitis, pericarditis, esophagitis, encephalitis. His labs from today show severe leukopenia and neutropenia.  He will repeat labs on Monday prior to his appointment with Dr. Julien Nordmann to see if he can proceed with his planned cycle 4 of chemotherapy. He understands there could be delay with chemotherapy if his leukopenia does not improve over the weekend. He will follow up with me week of April 25.   Benay Pike MD

## 2020-04-21 NOTE — Progress Notes (Unsigned)
[id number] TESTING THE ADDITION OF ATEZOLIZUMAB TO THE USUAL CHEMORADIATION TREATMENT FOR LIMITED STAGE SMALL CELL LUNG CANCER  Zakar arrives [facility] today for on-study RT, on-study Cycle 2/Day 15 labs and MD visit.  PROs: PRO questionnaires are completed upon arrival. Forms are checked for completeness. Elman does report brief  ringing in his ears, started 04/10/20,  that lasts approximately ten seconds. Patient states this has been occurring weekly  RT: Ehsan completes his daily RT without complaint.  LABS:Mandatory study labsarecollected perstudy protocol and [place] tolerates the procedure well without complaint.  VITAL SIGNS: Vital signs are collected per protocol. Pulse rate is below 100bpm today.   MEDICATION REVIEW:Dartanyan reviewrf thecurrent med list and states that he has completed the prednisone treatment.   MD VISIT: Sohil sees Dr. Chryl Heck today and he denies any complaints and reports to be doing well. Patient states he had an episode of constipation on 04/15/20 and states he took one dose of Ex Lax with positive results.   ADVERSE EVENTS: Seung has a baseline history of hypertension and anemia, both remain grade 1 per CTCAE 5.0. His cough remains resolved.   Today's lab results reveal grade 1 hyperglycemia, grade 1 hypoalbuminemia and grade 1 anemia: none are considered clinically significant. Quade also has grade 2 reduction in WBCs (these will be reported for the next visit).  Event Grade Onset Date End Date Status Comments Attribution to Cisplatin Attribution to Etoposide  Fatigue 1 03/22/2020 04/04/2020 Resolved Relieved by rest X   Unrelated  Unlikely  Possible  Probable  Definite  X   Unrelated  Unlikely  Possible  Probable  Definite   Tinnitus 1 03/29/2020 04/04/2020 Resolved No intervention required X   Unrelated  Unlikely  Possible  Probable  Definite  X   Unrelated  Unlikely  Possible  Probable  Definite   Decreased white  blood cell (WBC) count 3 03/29/2020 04/04/2020 Resolved No intervention required  Unrelated  Unlikely  Possible  Probable X   Definite   Unrelated  Unlikely  Possible  Probable X   Definite   Decreased neutrophil count 4 03/29/2020 04/04/2020 Resolved No intervention required  Unrelated  Unlikely  Possible  Probable X   Definite   Unrelated  Unlikely  Possible  Probable X   Definite   Allergic reaction 3 03/30/2020 04/04/2020 Resolved IV Decadron & PO prednisone taper X   Unrelated  Unlikely  Possible  Probable  Definite  X   Unrelated  Unlikely  Possible  Probable  Definite   Sinus tachycardia 1 04/04/2020 04/05/2020 Resolved No intervention required  Unrelated X   Unlikely  Possible  Probable  Definite   Unrelated X   Unlikely  Possible  Probable  Definite   Hiccups 1 04/08/2020 04/08/2020 Resolved  Relieved by Alka-Seltzer X   Unrelated  Unlikely  Possible  Probable  Definite  X   Unrelated  Unlikely  Possible  Probable  Definite   Tinnitus 1 03/29/20 ongoing  Per patient, occurs ~ once a week for less than 10 seconds X    Unrelated  Unlikely  Possible  Probable  Definite X   Unrelated  Unlikely  Possible  Probable  Definitee  Constipation  1 04/15/20 04/15/20 Resolved 1 dose of exlax   Unrelated  Unlikely  Possible  Probable  Definite  Unrelated  Unlikely  Possible  Probable  Definite  Decreased WBC 3 04/21/20  ongoing   Unrelated  Unlikely  Possible  Probable X   Definite  Unrelated  Unlikely  Possible  Probable X   Definite  Anemia 1 baseline 04/21/20  baseline    N/A N/A  Platelets: decreased 1 04/21/20  ongoing     Neutrophil count decreased 4 04/21/20  ongoing                           Current plan is for Daegan to return to the cancer center tomorrow to continue daily RT. Next MD/lab visit is scheduled for 04/25/2020.   Maxwell Marion, RN, BSN,  [facility] Clinical Research 04/21/2020 2:24 PM

## 2020-04-21 NOTE — Progress Notes (Signed)
Adam [name] CONSULT NOTE  Patient Care Team: Patient, No Pcp Per (Inactive) as PCP - General ([place]) [name], RN as Registered Nurse  CHIEF COMPLAINTS/PURPOSE OF CONSULTATION:  Limited stage small cell lung cancer.  ASSESSMENT & PLAN:  Small cell lung cancer, 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HCC) Small cell lung cancer, limited stage  LIMITED STAGE SMALL CELL LUNG CANCER (LS-SCLC): A PHASE III  RANDOMIZED STUDY OF CHEMORADIATION VERSUS CHEMORADIATION PLUS ATEZOLIZUMAB (10-June-2019)  C1D1 cis/etoposide completed on oh 2.10.22. C2D1 completed on 03/15/2020 through 03/17/2020 Cycle 3-day 1 completed on 04/05/2020 through 04/07/2020  He is here for follow-up per research protocol. He is doing very well except for one episode of constipation.  No other concerning review of systems.  Physical examination heart rate at the upper limit of normal, otherwise no concerns. No concern for pneumonitis, pericarditis, esophagitis, encephalitis. His labs from today show severe leukopenia and neutropenia.  He will repeat labs on Monday prior to his appointment with Dr. Julien Nordmann to see if he can proceed with his planned cycle 4 of chemotherapy. He understands there could be delay with chemotherapy if his leukopenia does not improve over the weekend. He will follow up with me week of April 25.   Benay Pike MD   Chemotherapy induced neutropenia ([place]) Chemotherapy induced neutropenia, ANC of 0.3 We will repeat labs.  He understands neutropenic precautions.  No orders of the defined types were placed in this encounter.  Thank you for consulting [place] in the care of this patient.  Please not hesitate to contact us with any additional questions or concerns.  HISTORY OF PRESENTING ILLNESS:   Adam [name] 65 y.o. male is here because of  Follow up of limited stage small cell lung cancer.  This is a 65 yr old male patient with PMH significant for HTN presented with chief  complaint of hemoptysis. He had a dental extraction 3 months ago and soon after noticed scant hemoptysis which he attributed to the dental extraction. He then had two more episodes of scant hemoptysis, he describes it as pea size in his sputum. He may have noticed worsening SOB in the past 3 months but not significant. He lost about 10 lbs of weight in the past several months. He denies any chest pain or worsening cough. No other complaints. No change in bowel or urinary habits. He smoked 20 PPY, quit about 2 yrs ago. He worked as a Probation officer, Theme park manager.  CT chest 02/06/2020 showed right upper lobe mass lesion with associated large nodal mass involving the right suprahilar region and mediastinum in the paratracheal region consistent with primary pulmonary neoplasm and metastatic nodal involvement. CT abdomen pelvis from 02/07/2020 showed an 8 mm nonobstructing calculus in the left kidney, small umbilical hernia, bubbly mixed lytic and sclerotic lesion within the right femoral head of uncertain acuity could represent a small enchondroma, metastatic is considered less likely but not entirely excluded. MRI brain from 02/07/2020 showed single polypoid focus of susceptibility artifact and faint nodular enhancement within the central pons.  Findings favored to reflect a small vascular structure short interval follow-up in 60-month suggested to document stability.  No other MRI evidence of intracranial metastatic disease. Cytology from bronchoscopy showed malignant cells consistent with small cell carcinoma in the 4R lymph node.  He is an excellent candidate for the following trial.  LIMITED STAGE SMALL CELL LUNG CANCER (LS-SCLC): A PHASE III  RANDOMIZED STUDY OF CHEMORADIATION VERSUS CHEMORADIATION PLUS ATEZOLIZUMAB (10-June-2019)  C1  D1 02/16/2020-02/18/2020 He has consented for the trial and is enrolled to the arm without immunotherapy. C2 D1-D3 03/15/2020-03/17/2020 C3D1 completed 04/05/2020  through 04/07/2020   Interim History  Mr. Adam [name] is here for follow-up prior to cycle 4 of chemotherapy He is doing quite well, one episode of constipation reported great which has improved. He did denies any changes in his breathing, no more hemoptysis, no cough or chest pain. He has been going through some seasonal allergies.  No difficulty swallowing, diarrhea, hematochezia or melena. No change in urinary habits. No headaches or other new neurological complaints.  He is doing quite well overall and continues to stay active. Rest of the pertinent 10 point ROS reviewed and negative.  REVIEW OF SYSTEMS:    Constitutional: Denies fevers, chills or abnormal night sweats Eyes: Denies blurriness of vision, double vision or watery eyes Ears, nose, mouth, throat, and face: Denies mucositis or sore throat Respiratory: Denies cough, dyspnea or wheezes Cardiovascular: Denies palpitation, chest discomfort or lower extremity swelling Gastrointestinal:  Denies nausea, heartburn or change in bowel habits Skin: Denies abnormal skin rashes Lymphatics: Denies new lymphadenopathy or easy bruising Neurological:Denies numbness, tingling or new weaknesses Behavioral/Psych: Mood is stable, no new changes  All other systems were reviewed with the patient and are negative.  MEDICAL HISTORY:  Past Medical History:  Diagnosis Date  . Hypertension   . Lung cancer ([place])    small cell RUL    SURGICAL HISTORY: Past Surgical History:  Procedure Laterality Date  . BRONCHIAL BRUSHINGS  02/08/2020   Procedure: BRONCHIAL BRUSHINGS;  Surgeon: Freddi Starr, MD;  Location: [facility];  Service: Pulmonary;;  . BRONCHIAL NEEDLE ASPIRATION BIOPSY  02/08/2020   Procedure: BRONCHIAL NEEDLE ASPIRATION BIOPSIES;  Surgeon: Freddi Starr, MD;  Location: [facility] ENDOSCOPY;  Service: Pulmonary;;  . KNEE SURGERY Right   . SHOULDER SURGERY Left   . VIDEO BRONCHOSCOPY WITH ENDOBRONCHIAL ULTRASOUND N/A 02/08/2020    Procedure: VIDEO BRONCHOSCOPY WITH ENDOBRONCHIAL ULTRASOUND;  Surgeon: Freddi Starr, MD;  Location: [facility];  Service: Pulmonary;  Laterality: N/A;    SOCIAL HISTORY: Social History   Socioeconomic History  . Marital status: Single    Spouse name: [name]  . Number of children: Not on file  . Years of education: Not on file  . Highest education level: Not on file  Occupational History  . Not on file  Tobacco Use  . Smoking status: Former Smoker    Packs/day: 1.00    Years: 20.00    Pack years: 20.00    Types: Cigarettes    Quit date: 06/09/2018    Years since quitting: 1.8  . Smokeless tobacco: Never Used  Vaping Use  . Vaping Use: Never used  Substance and Sexual Activity  . Alcohol use: Not Currently  . Drug use: Never  . Sexual activity: Yes  Other Topics Concern  . Not on file  Social History Narrative  . Not on file   Social Determinants of Health   Financial Resource Strain: Not on file  Food Insecurity: Not on file  Transportation Needs: Not on file  Physical Activity: Not on file  Stress: Not on file  Social Connections: Not on file  Intimate Partner Violence: Not on file    FAMILY HISTORY: Family History  Problem Relation Age of Onset  . Heart disease Mother   . Cancer Cousin   . Breast cancer Neg Hx   . Colon cancer Neg Hx   . Pancreatic cancer Neg Hx   .  Prostate cancer Neg Hx     ALLERGIES:  has No Known Allergies.  MEDICATIONS:  Current Outpatient Medications  Medication Sig Dispense Refill  . amLODipine (NORVASC) 10 MG tablet Take 10 mg by mouth daily.    Marland Kitchen ibuprofen (ADVIL) 600 MG tablet Take 600 mg by mouth every 6 (six) hours as needed. (Patient not taking: No sig reported)    . lisinopril-hydrochlorothiazide (ZESTORETIC) 20-12.5 MG tablet Take 1 tablet by mouth daily.    . predniSONE (DELTASONE) 5 MG tablet 6 tab x 2 days, 5 tab x 2 days, 4 tab x 2 days, 3 tab x 2 days, 2 tab x 2 days, 1 tab x 2 days, stop 42 tablet 0   No  current facility-administered medications for this visit.    PHYSICAL EXAMINATION:  ECOG PERFORMANCE STATUS: 0 - Asymptomatic  Vitals:   04/21/20 1320  BP: 128/85  Pulse: 97  Resp: 18  Temp: 97.9 F (36.6 C)  SpO2: 99%   Filed Weights   04/21/20 1320  Weight: 184 lb 14.4 oz (83.9 kg)    GENERAL:alert, no distress and comfortable,  SKIN: skin color, texture, turgor are normal, no rashes or significant lesions EYES: normal, conjunctiva are pink and non-injected, sclera clear OROPHARYNX:no exudate, no erythema and lips, buccal mucosa, and tongue normal  NECK: supple, thyroid normal size, non-tender, without nodularity LYMPH:  no palpable lymphadenopathy in the cervical, axillary or inguinal LUNGS: clear to auscultation and percussion with normal breathing effort HEART: Sinus tachycardia, no murmurs, no lower extremity edema.   ABDOMEN:abdomen soft, non-tender and normal bowel sounds Musculoskeletal:no cyanosis of digits and no clubbing  PSYCH: alert & oriented x 3 with fluent speech NEURO: no focal motor/sensory deficits . LABORATORY DATA:  I have reviewed the data as listed Lab Results  Component Value Date   WBC 1.3 (L) 04/21/2020   HGB 11.1 (L) 04/21/2020   HCT 34.6 (L) 04/21/2020   MCV 83.2 04/21/2020   PLT 140 (L) 04/21/2020     Chemistry      Component Value Date/Time   NA 140 04/21/2020 1302   K 4.0 04/21/2020 1302   CL 103 04/21/2020 1302   CO2 27 04/21/2020 1302   BUN 13 04/21/2020 1302   CREATININE 0.97 04/21/2020 1302      Component Value Date/Time   CALCIUM 9.7 04/21/2020 1302   ALKPHOS 78 04/21/2020 1302   AST 16 04/21/2020 1302   ALT 12 04/21/2020 1302   BILITOT 0.3 04/21/2020 1302     I reviewed his labs from today.  Leukopenia and severe neutropenia.  We will repeat labs on Monday to see if he can proceed with his last cycle of chemotherapy.  RADIOGRAPHIC STUDIES: I have personally reviewed the radiological images as listed and agreed with  the findings in the report.  No results found.  All questions were answered. The patient knows to call the clinic with any problems, questions or concerns. I spent 30 minutes in the care of this patient including H and P, review of records, counseling and coordination of care.     Benay Pike, MD 04/21/2020 2:43 PM

## 2020-04-22 ENCOUNTER — Ambulatory Visit
Admission: RE | Admit: 2020-04-22 | Discharge: 2020-04-22 | Disposition: A | Discharge status: Home / Self Care | Payer: 59 | Source: Ambulatory Visit | Attending: Radiation Oncology | Admitting: Radiation Oncology

## 2020-04-22 DIAGNOSIS — Z5111 Encounter for antineoplastic chemotherapy: Secondary | ICD-10-CM | POA: Diagnosis not present

## 2020-04-22 NOTE — Progress Notes (Signed)
Complete post sim education with patient today. Oriented patient to staff and routine of the clinic. Provided patient with RADIATION THERAPY AND YOU handbook then, reviewed pertinent information. Educated patient reference potential side effects and management such as fatigue, diarrhea and urinary/bladder changes. Answered all patient questions to the best of my ability. Provided patient with my business card and encouraged him to call with future needs. Patient verbalized understanding of all reviewed.

## 2020-04-25 ENCOUNTER — Ambulatory Visit
Admission: RE | Admit: 2020-04-25 | Discharge: 2020-04-25 | Disposition: A | Discharge status: Home / Self Care | Payer: 59 | Source: Ambulatory Visit | Attending: Radiation Oncology | Admitting: Radiation Oncology

## 2020-04-25 ENCOUNTER — Ambulatory Visit: Payer: 59 | Admitting: Hematology and Oncology

## 2020-04-25 ENCOUNTER — Other Ambulatory Visit: Payer: Self-pay

## 2020-04-25 ENCOUNTER — Inpatient Hospital Stay: Payer: 59

## 2020-04-25 ENCOUNTER — Inpatient Hospital Stay (HOSPITAL_BASED_OUTPATIENT_CLINIC_OR_DEPARTMENT_OTHER): Payer: 59 | Admitting: Internal Medicine

## 2020-04-25 ENCOUNTER — Other Ambulatory Visit: Payer: 59

## 2020-04-25 ENCOUNTER — Inpatient Hospital Stay: Payer: 59 | Admitting: Emergency Medicine

## 2020-04-25 VITALS — BP 126/90 | HR 104 | Temp 97.6°F | Resp 20 | Ht 69.0 in | Wt 186.5 lb

## 2020-04-25 DIAGNOSIS — T451X5A Adverse effect of antineoplastic and immunosuppressive drugs, initial encounter: Secondary | ICD-10-CM | POA: Diagnosis not present

## 2020-04-25 DIAGNOSIS — C3411 Malignant neoplasm of upper lobe, right bronchus or lung: Secondary | ICD-10-CM

## 2020-04-25 DIAGNOSIS — D701 Agranulocytosis secondary to cancer chemotherapy: Secondary | ICD-10-CM

## 2020-04-25 DIAGNOSIS — Z5111 Encounter for antineoplastic chemotherapy: Secondary | ICD-10-CM | POA: Diagnosis not present

## 2020-04-25 DIAGNOSIS — Z006 Encounter for examination for normal comparison and control in clinical research program: Secondary | ICD-10-CM

## 2020-04-25 LAB — CMP (CANCER CENTER ONLY)
ALT: 9 U/L (ref 0–44)
AST: 15 U/L (ref 15–41)
Albumin: 3.6 g/dL (ref 3.5–5.0)
Alkaline Phosphatase: 67 U/L (ref 38–126)
Anion gap: 12 (ref 5–15)
BUN: 15 mg/dL (ref 8–23)
CO2: 26 mmol/L (ref 22–32)
Calcium: 9.5 mg/dL (ref 8.9–10.3)
Chloride: 103 mmol/L (ref 98–111)
Creatinine: 1.14 mg/dL (ref 0.61–1.24)
GFR, Estimated: >60 mL/min (ref >60)
Glucose, Bld: 99 mg/dL (ref 70–99)
Potassium: 4.1 mmol/L (ref 3.5–5.1)
Sodium: 141 mmol/L (ref 135–145)
Total Bilirubin: 0.3 mg/dL (ref 0.3–1.2)
Total Protein: 7.2 g/dL (ref 6.5–8.1)

## 2020-04-25 LAB — CBC WITH DIFFERENTIAL (CANCER CENTER ONLY)
Abs Immature Granulocytes: 0.04 10*3/uL (ref 0.00–0.07)
Basophils Absolute: 0 10*3/uL (ref 0.0–0.1)
Basophils Relative: 1 %
Eosinophils Absolute: 0.2 10*3/uL (ref 0.0–0.5)
Eosinophils Relative: 9 %
HCT: 36.9 % — ABNORMAL LOW (ref 39.0–52.0)
Hemoglobin: 11.6 g/dL — ABNORMAL LOW (ref 13.0–17.0)
Immature Granulocytes: 2 %
Lymphocytes Relative: 25 %
Lymphs Abs: 0.5 10*3/uL — ABNORMAL LOW (ref 0.7–4.0)
MCH: 26.6 pg (ref 26.0–34.0)
MCHC: 31.4 g/dL (ref 30.0–36.0)
MCV: 84.6 fL (ref 80.0–100.0)
Monocytes Absolute: 0.8 10*3/uL (ref 0.1–1.0)
Monocytes Relative: 36 %
Neutro Abs: 0.6 10*3/uL — ABNORMAL LOW (ref 1.7–7.7)
Neutrophils Relative %: 27 %
Platelet Count: 250 10*3/uL (ref 150–400)
RBC: 4.36 MIL/uL (ref 4.22–5.81)
RDW: 18.6 % — ABNORMAL HIGH (ref 11.5–15.5)
WBC Count: 2.2 10*3/uL — ABNORMAL LOW (ref 4.0–10.5)
nRBC: 0 % (ref 0.0–0.2)

## 2020-04-25 LAB — TSH: TSH: 1.789 u[IU]/mL (ref 0.320–4.118)

## 2020-04-25 NOTE — Research (Signed)
[id number] TESTING THE ADDITION OF ATEZOLIZUMAB TO THE USUAL CHEMORADIATION TREATMENT FOR LIMITED STAGE SMALL CELL LUNG CANCER  Erwin arrives unaccompanied today for on-study RT, on-study Cycle 3/Day 1 labs and MD visit.  RT: Damian completes his daily RT without complaint.  LABS:Mandatory study labsarecollected perstudy protocol and Amiri tolerates the procedure well without complaint.  VITAL SIGNS: Vital signs are collected per protocol. See Adverse Events.  MEDICATION REVIEW: Pt confirmed no changes in medication list.   MD VISIT: Abbott sees Dr. Julien Nordmann today and he denies any complaints other than intermittent pain along R side of chest when the patient coughs that started 04/24/20.  Per CTCAE v 5.0 qualifies as Grade 1 non-cardiac chest pain.  Pt denies ongoing coughing.  Pt reports that pain is relieved when he stretches his R arm.  MD Mohamed aware and determined to be unrelated to treatment.  ADVERSE EVENTS: Anik has a baseline history of hypertension and anemia, both remain grade 1 per CTCAE 5.0. His cough remains resolved. Today's heart rate is 104 bpm, per CTCAE v 5.0 qualifies as Grade 1.  Pt has history of baseline sinus tachycardia Grade 1 in the past that was resolved at last visit.  Today's BP is 126/90, per CTCAE v 5.0 qualifies as Grade 1.  Pt has ongoing baseline history of hypertension. Pt has a baseline history of anemia which remains a Grade 1. HCT and RDW are below range but have not be identified in CTCAE v 5.0 and are not clinically significant.   Event Grade Onset Date End Date Status Comments Attribution to Cisplatin Attribution to Etoposide  Fatigue 1 03/22/2020 04/04/2020 Resolved Relieved by rest X   Unrelated  Unlikely  Possible  Probable  Definite  X   Unrelated  Unlikely  Possible  Probable  Definite   Tinnitus 1 03/29/2020 04/04/2020 Resolved No intervention required X   Unrelated  Unlikely  Possible  Probable  Definite  X    Unrelated  Unlikely  Possible  Probable  Definite   Decreased white blood cell (WBC) count 3 03/29/2020 04/04/2020 Resolved No intervention required  Unrelated  Unlikely  Possible  Probable X   Definite   Unrelated  Unlikely  Possible  Probable X   Definite   Decreased neutrophil count 4 03/29/2020 04/04/2020 Resolved No intervention required  Unrelated  Unlikely  Possible  Probable X   Definite   Unrelated  Unlikely  Possible  Probable X   Definite   Allergic reaction 3 03/30/2020 04/04/2020 Resolved IV Decadron & PO prednisone taper X   Unrelated  Unlikely  Possible  Probable  Definite  X   Unrelated  Unlikely  Possible  Probable  Definite   Sinus tachycardia 1 04/04/2020 04/05/2020 Resolved No intervention required  Unrelated X   Unlikely  Possible  Probable  Definite   Unrelated X   Unlikely  Possible  Probable  Definite   Hiccups 1 04/08/2020 04/08/2020 Resolved  Relieved by Alka-Seltzer X   Unrelated  Unlikely  Possible  Probable  Definite  X   Unrelated  Unlikely  Possible  Probable  Definite    Tinnitus 1 03/29/20 ongoing  Per patient, occurs ~ once a week for less than 10 seconds X    Unrelated  Unlikely  Possible  Probable  Definite X   Unrelated  Unlikely  Possible  Probable  Definitee  Constipation  1 04/15/20 04/15/20 Resolved 1 dose of exlax   Unrelated  Unlikely  Possible  Probable  Definite  Unrelated  Unlikely  Possible  Probable  Definite  Decreased WBC 3 04/21/20  ongoing   Unrelated  Unlikely  Possible  Probable X Definite  Unrelated  Unlikely  Possible  Probable X Definite  Platelets: decreased 1 04/21/20 04/25/2020 Resolved  See attribution from date of onset See attribution from date of onset  Neutrophil count decreased 4 04/21/20 04/25/20 Resolved   Unrelated  Unlikely  Possible  Probable X Definite   Unrelated  Unlikely  Possible  Probable X Definite  Neutrophil count decreased 3 04/25/20  Ongoing   Unrelated  Unlikely  Possible  Probable X Definite  Unrelated  Unlikely  Possible  Probable X Definite  Non-cardiac chest pain 1 04/25/20  Ongoing  X    Unrelated  Unlikely  Possible  Probable  Definite X    Unrelated  Unlikely  Possible  Probable  Definite  Sinus tachycardia 1 04/25/20  ongoing   Unrelated X   Unlikely  Possible  Probable Definite  Unrelated X   Unlikely  Possible  Probable Definite   Current plan is to hold patient's treatment originally scheduled for 04/26/2020 per MD Mohamed due to pt's low WBC count.  Per protocol pt cannot have pegfilgrastim/other WBC growth stimulating injections while on Radiotherapy.  MD [facility] aware, states he will recheck pt's lab next week once he is finished with radiation to see if he should get a shot then.  Pt continuing with radiation tomorrow and remained of week as planned.  Pt aware of plan to hold off on treatment.  Wells Guiles '[place], RN, BSN Clinical Research Nurse 04/25/2020, 12:48 pm

## 2020-04-25 NOTE — Research (Signed)
[id number] TESTING THE ADDITION OF ATEZOLIZUMAB TO THE USUAL CHEMORADIATION TREATMENT FOR LIMITED STAGE SMALL CELL LUNG CANCER  Mattix arrives unaccompanied today for on-study RT, on-study Cycle 3/Day 1 labs and MD visit.  RT: Sigmund completes his daily RT without complaint.  LABS:Mandatory study labsarecollected perstudy protocol and Riyan tolerates the procedure well without complaint.  VITAL SIGNS: Vital signs are collected per protocol. See Adverse Events.  MEDICATION REVIEW: Pt confirmed no changes in medication list.   MD VISIT: Lennyn sees Dr. Julien Nordmann today and he denies any complaints other than intermittent pain along R side of chest when the patient coughs that started 04/24/20.  Per CTCAE v 5.0 qualifies as Grade 1 non-cardiac chest pain.  Pt denies ongoing coughing.  Pt reports that pain is relieved when he stretches his R arm.  MD Mohamed aware and determined to be unrelated to treatment.  ADVERSE EVENTS: Geo has a baseline history of hypertension and anemia, both remain grade 1 per CTCAE 5.0. His cough remains resolved. Today's heart rate is 104 bpm, per CTCAE v 5.0 qualifies as Grade 1.  Pt has history of baseline sinus tachycardia Grade 1 in the past that was resolved at last visit.  Today's BP is 126/90, per CTCAE v 5.0 qualifies as Grade 1.  Pt has ongoing baseline history of hypertension. Pt has a baseline history of anemia which remains a Grade 1. HCT and RDW are below range but have not be identified in CTCAE v 5.0 and are not clinically significant.   Event Grade Onset Date End Date Status Comments Attribution to Cisplatin Attribution to Etoposide  Fatigue 1 03/22/2020 04/04/2020 Resolved Relieved by rest X   Unrelated  Unlikely  Possible  Probable  Definite  X   Unrelated  Unlikely  Possible  Probable  Definite   Tinnitus 1 03/29/2020 04/04/2020 Resolved No intervention required X   Unrelated  Unlikely  Possible  Probable  Definite  X    Unrelated  Unlikely  Possible  Probable  Definite   Decreased white blood cell (WBC) count 3 03/29/2020 04/04/2020 Resolved No intervention required  Unrelated  Unlikely  Possible  Probable X   Definite   Unrelated  Unlikely  Possible  Probable X   Definite   Decreased neutrophil count 4 03/29/2020 04/04/2020 Resolved No intervention required  Unrelated  Unlikely  Possible  Probable X   Definite   Unrelated  Unlikely  Possible  Probable X   Definite   Allergic reaction 3 03/30/2020 04/04/2020 Resolved IV Decadron & PO prednisone taper X   Unrelated  Unlikely  Possible  Probable  Definite  X   Unrelated  Unlikely  Possible  Probable  Definite   Sinus tachycardia 1 04/04/2020 04/05/2020 Resolved No intervention required  Unrelated X   Unlikely  Possible  Probable  Definite   Unrelated X   Unlikely  Possible  Probable  Definite   Hiccups 1 04/08/2020 04/08/2020 Resolved  Relieved by Alka-Seltzer X   Unrelated  Unlikely  Possible  Probable  Definite  X   Unrelated  Unlikely  Possible  Probable  Definite    Tinnitus 1 03/29/20 ongoing  Per patient, occurs ~ once a week for less than 10 seconds X    Unrelated  Unlikely  Possible  Probable  Definite X   Unrelated  Unlikely  Possible  Probable  Definitee  Constipation  1 04/15/20 04/15/20 Resolved 1 dose of exlax   Unrelated  Unlikely  Possible  Probable  Definite  Unrelated  Unlikely  Possible  Probable  Definite  Decreased WBC 3 04/21/20  ongoing   Unrelated  Unlikely  Possible  Probable X Definite  Unrelated  Unlikely  Possible  Probable X Definite  Platelets: decreased 1 04/21/20 04/25/2020 Resolved  See attribution from date of onset See attribution from date of onset  Neutrophil count decreased 4 04/21/20 04/25/20 Resolved   Unrelated  Unlikely  Possible  Probable X Definite   Unrelated  Unlikely  Possible  Probable X Definite  Neutrophil count decreased 3 04/25/20  Ongoing   Unrelated  Unlikely  Possible  Probable X Definite  Unrelated  Unlikely  Possible  Probable X Definite  Non-cardiac chest pain 1 04/25/20  Ongoing  X    Unrelated  Unlikely  Possible  Probable  Definite X    Unrelated  Unlikely  Possible  Probable  Definite  Sinus tachycardia 1 04/25/20  ongoing   Unrelated X   Unlikely  Possible  Probable Definite  Unrelated X   Unlikely  Possible  Probable Definite   Current plan is to hold patient's treatment originally scheduled for 04/26/2020 per MD Mohamed due to pt's low WBC count.  Per protocol pt cannot have pegfilgrastim/other WBC growth stimulating injections while on Radiotherapy.  MD [facility] aware, states he will recheck pt's lab next week once he is finished with radiation to see if he should get a shot then.  Pt continuing with radiation tomorrow and remained of week as planned.  Pt aware of plan to hold off on treatment.  Wells Guiles '[place], RN, BSN Clinical Research Nurse 04/25/2020, 12:48 pm

## 2020-04-25 NOTE — Progress Notes (Signed)
[facility] [phone]   [phone]  OFFICE PROGRESS NOTE  Patient, No Pcp Per (Inactive) No address on file  DIAGNOSIS: Limited stage (T1c, N2, M0) small cell lung cancer diagnosed in February 2022.  PRIOR THERAPY: None  CURRENT THERAPY: Systemic chemotherapy according to phase 3 randomized clinical trial of chemoradiation versus chemoradiation plus atezolizumab status post 2 cycles.  INTERVAL HISTORY: Adam [name] 65 y.o. male returns to the clinic today for follow-up visit.  The patient is feeling fine today with no concerning complaints except for occasional pain on the right side of the chest with cough.  He denied having any shortness of breath or hemoptysis.  He denied having any current nausea, vomiting, diarrhea or constipation.  He has no fever or chills.  He has no headache or visual changes.  He is tolerating his current treatment with systemic chemotherapy and radiation fairly well.  The patient is here today for evaluation before starting cycle #3.  MEDICAL HISTORY: Past Medical History:  Diagnosis Date  . Hypertension   . Lung cancer ([place])    small cell RUL    ALLERGIES:  has No Known Allergies.  MEDICATIONS:  Current Outpatient Medications  Medication Sig Dispense Refill  . amLODipine (NORVASC) 10 MG tablet Take 10 mg by mouth daily.    Marland Kitchen ibuprofen (ADVIL) 600 MG tablet Take 600 mg by mouth every 6 (six) hours as needed. (Patient not taking: No sig reported)    . lisinopril-hydrochlorothiazide (ZESTORETIC) 20-12.5 MG tablet Take 1 tablet by mouth daily.    . predniSONE (DELTASONE) 5 MG tablet 6 tab x 2 days, 5 tab x 2 days, 4 tab x 2 days, 3 tab x 2 days, 2 tab x 2 days, 1 tab x 2 days, stop 42 tablet 0   No current facility-administered medications for this visit.    SURGICAL HISTORY:  Past Surgical History:  Procedure Laterality Date  . BRONCHIAL BRUSHINGS  02/08/2020   Procedure: BRONCHIAL BRUSHINGS;  Surgeon:  Freddi Starr, MD;  Location: [facility];  Service: Pulmonary;;  . BRONCHIAL NEEDLE ASPIRATION BIOPSY  02/08/2020   Procedure: BRONCHIAL NEEDLE ASPIRATION BIOPSIES;  Surgeon: Freddi Starr, MD;  Location: [facility] ENDOSCOPY;  Service: Pulmonary;;  . KNEE SURGERY Right   . SHOULDER SURGERY Left   . VIDEO BRONCHOSCOPY WITH ENDOBRONCHIAL ULTRASOUND N/A 02/08/2020   Procedure: VIDEO BRONCHOSCOPY WITH ENDOBRONCHIAL ULTRASOUND;  Surgeon: Freddi Starr, MD;  Location: [facility];  Service: Pulmonary;  Laterality: N/A;    REVIEW OF SYSTEMS:  Constitutional: negative Eyes: negative Ears, nose, mouth, throat, and face: negative Respiratory: positive for pleurisy/chest pain Cardiovascular: negative Gastrointestinal: negative Genitourinary:negative Integument/breast: negative Hematologic/lymphatic: negative Musculoskeletal:negative Neurological: negative Behavioral/Psych: negative Endocrine: negative Allergic/Immunologic: negative   PHYSICAL EXAMINATION: General appearance: alert, cooperative and no distress Head: Normocephalic, without obvious abnormality, atraumatic Neck: no adenopathy, no JVD, supple, symmetrical, trachea midline and thyroid not enlarged, symmetric, no tenderness/mass/nodules Lymph nodes: Cervical, supraclavicular, and axillary nodes normal. Resp: clear to auscultation bilaterally Back: symmetric, no curvature. ROM normal. No CVA tenderness. Cardio: regular rate and rhythm, S1, S2 normal, no murmur, click, rub or gallop GI: soft, non-tender; bowel sounds normal; no masses,  no organomegaly Extremities: extremities normal, atraumatic, no cyanosis or edema Neurologic: Alert and oriented X 3, normal strength and tone. Normal symmetric reflexes. Normal coordination and gait  ECOG PERFORMANCE STATUS: 1 - Symptomatic but completely ambulatory  Blood pressure 126/90, pulse (!) 104, temperature 97.6 F (36.4  C), temperature source Tympanic, resp. rate 20, height 5\' 9"   (1.753 m), weight 186 lb 8 oz (84.6 kg), SpO2 100 %.  LABORATORY DATA: Lab Results  Component Value Date   WBC 2.2 (L) 04/25/2020   HGB 11.6 (L) 04/25/2020   HCT 36.9 (L) 04/25/2020   MCV 84.6 04/25/2020   PLT 250 04/25/2020      Chemistry      Component Value Date/Time   NA 141 04/25/2020 0805   K 4.1 04/25/2020 0805   CL 103 04/25/2020 0805   CO2 26 04/25/2020 0805   BUN 15 04/25/2020 0805   CREATININE 1.14 04/25/2020 0805      Component Value Date/Time   CALCIUM 9.5 04/25/2020 0805   ALKPHOS 67 04/25/2020 0805   AST 15 04/25/2020 0805   ALT 9 04/25/2020 0805   BILITOT 0.3 04/25/2020 0805       RADIOGRAPHIC STUDIES: No results found.  ASSESSMENT AND PLAN: This is a very pleasant 65 years old African-American male recently diagnosed with limited stage small cell lung cancer in February 2022.  The patient is currently undergoing treatment according to the [place] clinical trial where the patient would be randomized to treatment with concurrent chemoradiation with cisplatin and/or etoposide versus concurrent chemoradiation with the same regimen in addition to atezolizumab. He is status post 2 cycles of the treatment and has been tolerating his treatment fairly well with no concerning adverse effects. He was supposed to start cycle #3 today but unfortunately his total white blood count is low at 2.2 and absolute neutrophil count 0.6. I recommended for the patient to delay his treatment by 1 week until improvement of his blood count before resuming the treatment. We will also check with the study protocol to see if the patient can receive G-CSF in the next few days to improve his count before resuming his treatment next week. The patient will come back for follow-up visit in 1 week for evaluation by Dr. Chryl Heck before starting the next cycle of his treatment. The patient was advised to call immediately if he has any other concerning symptoms in the interval. The patient voices  understanding of current disease status and treatment options and is in agreement with the current care plan.  All questions were answered. The patient knows to call the clinic with any problems, questions or concerns. We can certainly see the patient much sooner if necessary.  The total time spent in the appointment was 30 minutes.  Disclaimer: This note was dictated with voice recognition software. Similar sounding words can inadvertently be transcribed and may not be corrected upon review.

## 2020-04-26 ENCOUNTER — Telehealth: Payer: Self-pay

## 2020-04-26 ENCOUNTER — Ambulatory Visit
Admission: RE | Admit: 2020-04-26 | Discharge: 2020-04-26 | Disposition: A | Discharge status: Home / Self Care | Payer: 59 | Source: Ambulatory Visit | Attending: Radiation Oncology | Admitting: Radiation Oncology

## 2020-04-26 ENCOUNTER — Inpatient Hospital Stay: Payer: 59

## 2020-04-26 DIAGNOSIS — Z5111 Encounter for antineoplastic chemotherapy: Secondary | ICD-10-CM | POA: Diagnosis not present

## 2020-04-26 NOTE — Telephone Encounter (Signed)
Patient scheduled for infusion appointment today at [facility] but has not arrived. Patient went to radiation appointment at [facility], but did not recheck in. Called patient cell phone and left voicemail regarding today's appt. Called for patient in both waiting rooms with no response. Charge RN and Dr. Rob Hickman RN made aware.

## 2020-04-27 ENCOUNTER — Other Ambulatory Visit: Payer: Self-pay

## 2020-04-27 ENCOUNTER — Inpatient Hospital Stay: Payer: 59

## 2020-04-27 ENCOUNTER — Telehealth: Payer: Self-pay | Admitting: Hematology and Oncology

## 2020-04-27 ENCOUNTER — Ambulatory Visit
Admission: RE | Admit: 2020-04-27 | Discharge: 2020-04-27 | Disposition: A | Discharge status: Home / Self Care | Payer: 59 | Source: Ambulatory Visit | Attending: Radiation Oncology | Admitting: Radiation Oncology

## 2020-04-27 ENCOUNTER — Ambulatory Visit: Payer: 59

## 2020-04-27 DIAGNOSIS — C3411 Malignant neoplasm of upper lobe, right bronchus or lung: Secondary | ICD-10-CM

## 2020-04-27 DIAGNOSIS — Z5111 Encounter for antineoplastic chemotherapy: Secondary | ICD-10-CM | POA: Diagnosis not present

## 2020-04-27 NOTE — Telephone Encounter (Signed)
R/s appts per 4/19 sch msg. Called pt, no answer. Left msg with appts dates and times.

## 2020-04-27 NOTE — Telephone Encounter (Signed)
Scheduled appts per 4/19 sch msg. Pt aware.

## 2020-04-28 ENCOUNTER — Ambulatory Visit: Payer: 59

## 2020-04-28 ENCOUNTER — Encounter: Payer: Self-pay | Admitting: Urology

## 2020-04-28 ENCOUNTER — Ambulatory Visit
Admission: RE | Admit: 2020-04-28 | Discharge: 2020-04-28 | Disposition: A | Discharge status: Home / Self Care | Payer: 59 | Source: Ambulatory Visit | Attending: Radiation Oncology | Admitting: Radiation Oncology

## 2020-04-28 DIAGNOSIS — Z5111 Encounter for antineoplastic chemotherapy: Secondary | ICD-10-CM | POA: Diagnosis not present

## 2020-04-29 ENCOUNTER — Ambulatory Visit: Payer: 59

## 2020-04-30 ENCOUNTER — Ambulatory Visit: Payer: 59

## 2020-05-02 ENCOUNTER — Ambulatory Visit: Payer: 59

## 2020-05-02 NOTE — Progress Notes (Signed)
[facility] CONSULT NOTE  Patient Care Team: Patient, No Pcp Per (Inactive) as PCP - General ([facility]) [name], RN as Registered Nurse  CHIEF COMPLAINTS/PURPOSE OF CONSULTATION:  Limited stage small cell lung cancer.  ASSESSMENT & PLAN:  Small cell lung cancer, 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HCC) Small cell lung cancer, limited stage  LIMITED STAGE SMALL CELL LUNG CANCER (LS-SCLC): A PHASE III  RANDOMIZED STUDY OF CHEMORADIATION VERSUS CHEMORADIATION PLUS ATEZOLIZUMAB (10-June-2019)  C1D1 cis/etoposide completed on oh 2.10.22. C2D1 completed on 03/15/2020 through 03/17/2020 Cycle 3-day 1 completed on 04/05/2020 through 04/07/2020 C4 D1 delayed by a week because of neutropenia, D1 05/03/2020  He is here for follow-up per research protocol. He is doing very well.  No concerning review of systems.  Physical examination heart rate at the upper limit of normal, otherwise no concerns. No concern for pneumonitis, pericarditis, esophagitis, encephalitis. His labs from today show resolution of leukopenia and neutropenia, hence will go ahead and proceed with C4D1 today. Will RTC in one week per trial protocol Repeat imaging ordered per protocol.   Benay Pike MD   Chemotherapy induced neutropenia (HCC) Resolved. ANC normal today. Ok to resume chemotherapy  Encounter for antineoplastic chemotherapy No Adverse effects reported from chemotherapy. Ok to proceed with chemo as planned.  No orders of the defined types were placed in this encounter.  Thank you for consulting [place] in the care of this patient.  Please not hesitate to contact us with any additional questions or concerns.  HISTORY OF PRESENTING ILLNESS:   Adam [name] 65 y.o. male is here because of  Follow up of limited stage small cell lung cancer.  He is currently on the following trial. Please refer to my original note for complete history.  LIMITED STAGE SMALL CELL LUNG CANCER (LS-SCLC): A  PHASE III  RANDOMIZED STUDY OF CHEMORADIATION VERSUS CHEMORADIATION PLUS ATEZOLIZUMAB (10-June-2019)  C1 D1 02/16/2020-02/18/2020 He has consented for the trial and is enrolled to the arm without immunotherapy. C2 D1-D3 03/15/2020-03/17/2020 C3D1 completed 04/05/2020 through 04/07/2020 C4D1 delayed by a week because of neutropenia.  Interim History  Adam [name] is here for follow-up prior to cycle 4 of chemotherapy He didn't get his last treatment because of ongoing neutropenia. He feels better compared to last week. More energy, he does his daily walks. No cough, chest pain, SOB, change in breathing or bowel habits. No new neurological complaints. Rest of the pertinent 10 point ROS reviewed and negative.  REVIEW OF SYSTEMS:    Constitutional: Denies fevers, chills or abnormal night sweats Eyes: Denies blurriness of vision, double vision or watery eyes Ears, nose, mouth, throat, and face: Denies mucositis or sore throat Respiratory: Denies cough, dyspnea or wheezes Cardiovascular: Denies palpitation, chest discomfort or lower extremity swelling Gastrointestinal:  Denies nausea, heartburn or change in bowel habits Skin: Denies abnormal skin rashes Lymphatics: Denies new lymphadenopathy or easy bruising Neurological:Denies numbness, tingling or new weaknesses Behavioral/Psych: Mood is stable, no new changes  All other systems were reviewed with the patient and are negative.  MEDICAL HISTORY:  Past Medical History:  Diagnosis Date  . Hypertension   . Lung cancer ([place])    small cell RUL    SURGICAL HISTORY: Past Surgical History:  Procedure Laterality Date  . BRONCHIAL BRUSHINGS  02/08/2020   Procedure: BRONCHIAL BRUSHINGS;  Surgeon: Freddi Starr, MD;  Location: [facility];  Service: Pulmonary;;  . BRONCHIAL NEEDLE ASPIRATION BIOPSY  02/08/2020   Procedure: BRONCHIAL NEEDLE ASPIRATION BIOPSIES;  Surgeon:  Freddi Starr, MD;  Location: [facility] ENDOSCOPY;  Service: Pulmonary;;  .  KNEE SURGERY Right   . SHOULDER SURGERY Left   . VIDEO BRONCHOSCOPY WITH ENDOBRONCHIAL ULTRASOUND N/A 02/08/2020   Procedure: VIDEO BRONCHOSCOPY WITH ENDOBRONCHIAL ULTRASOUND;  Surgeon: Freddi Starr, MD;  Location: [facility];  Service: Pulmonary;  Laterality: N/A;    SOCIAL HISTORY: Social History   Socioeconomic History  . Marital status: Single    Spouse name: [name]  . Number of children: Not on file  . Years of education: Not on file  . Highest education level: Not on file  Occupational History  . Not on file  Tobacco Use  . Smoking status: Former Smoker    Packs/day: 1.00    Years: 20.00    Pack years: 20.00    Types: Cigarettes    Quit date: 06/09/2018    Years since quitting: 1.9  . Smokeless tobacco: Never Used  Vaping Use  . Vaping Use: Never used  Substance and Sexual Activity  . Alcohol use: Not Currently  . Drug use: Never  . Sexual activity: Yes  Other Topics Concern  . Not on file  Social History Narrative  . Not on file   Social Determinants of Health   Financial Resource Strain: Not on file  Food Insecurity: Not on file  Transportation Needs: Not on file  Physical Activity: Not on file  Stress: Not on file  Social Connections: Not on file  Intimate Partner Violence: Not on file    FAMILY HISTORY: Family History  Problem Relation Age of Onset  . Heart disease Mother   . Cancer Cousin   . Breast cancer Neg Hx   . Colon cancer Neg Hx   . Pancreatic cancer Neg Hx   . Prostate cancer Neg Hx     ALLERGIES:  has No Known Allergies.  MEDICATIONS:  Current Outpatient Medications  Medication Sig Dispense Refill  . amLODipine (NORVASC) 10 MG tablet Take 10 mg by mouth daily.    Marland Kitchen lisinopril-hydrochlorothiazide (ZESTORETIC) 20-12.5 MG tablet Take 1 tablet by mouth daily.    Marland Kitchen ibuprofen (ADVIL) 600 MG tablet Take 600 mg by mouth every 6 (six) hours as needed. (Patient not taking: No sig reported)     No current facility-administered  medications for this visit.   Facility-Administered Medications Ordered in Other Visits  Medication Dose Route Frequency Provider Last Rate Last Admin  . CISplatin (PLATINOL) 120 mg in sodium chloride 0.9 % 500 mL chemo infusion  60 mg/m2 (Treatment Plan Recorded) Intravenous Once Jena Tegeler, Arletha Pili, MD      . etoposide (VEPESID) 240 mg in sodium chloride 0.9 % 1,000 mL chemo infusion  120 mg/m2 (Treatment Plan Recorded) Intravenous Once Nickia Boesen, Arletha Pili, MD      . fosaprepitant (EMEND) 150 mg in sodium chloride 0.9 % 145 mL IVPB  150 mg Intravenous Once Jaxsen Bernhart, MD      . heparin lock flush 100 unit/mL  500 Units Intracatheter Once PRN Baleigh Rennaker, MD      . magnesium sulfate IVPB 2 g 50 mL  2 g Intravenous Once Benay Pike, MD 50 mL/hr at 05/03/20 1127 2 g at 05/03/20 1127  . palonosetron (ALOXI) injection 0.25 mg  0.25 mg Intravenous Once Denise Washburn, MD      . sodium chloride flush (NS) 0.9 % injection 10 mL  10 mL Intracatheter PRN Josean Lycan, Arletha Pili, MD        PHYSICAL EXAMINATION:  ECOG PERFORMANCE STATUS: 0 -  Asymptomatic  Vitals:   05/03/20 0940  BP: (!) 144/95  Pulse: 94  Resp: 17  Temp: 97.8 F (36.6 C)  SpO2: 100%   Filed Weights   05/03/20 0940  Weight: 189 lb 11.2 oz (86 kg)   GENERAL:alert, no distress and comfortable,  SKIN: skin color, texture, turgor are normal, no rashes or significant lesions EYES: normal, conjunctiva are pink and non-injected, sclera clear OROPHARYNX:no exudate, no erythema and lips, buccal mucosa, and tongue normal  NECK: supple, thyroid normal size, non-tender, without nodularity LYMPH:  no palpable lymphadenopathy in the cervical, axillary or inguinal LUNGS: clear to auscultation and percussion with normal breathing effort HEART: Sinus tachycardia, no murmurs, no lower extremity edema.   ABDOMEN:abdomen soft, non-tender and normal bowel sounds Musculoskeletal:no cyanosis of digits and no clubbing  PSYCH: alert & oriented x  3 with fluent speech NEURO: no focal motor/sensory deficits . LABORATORY DATA:  I have reviewed the data as listed Lab Results  Component Value Date   WBC 3.8 (L) 05/03/2020   HGB 11.3 (L) 05/03/2020   HCT 35.7 (L) 05/03/2020   MCV 85.4 05/03/2020   PLT 278 05/03/2020     Chemistry      Component Value Date/Time   NA 142 05/03/2020 0906   K 4.2 05/03/2020 0906   CL 104 05/03/2020 0906   CO2 29 05/03/2020 0906   BUN 12 05/03/2020 0906   CREATININE 0.99 05/03/2020 0906      Component Value Date/Time   CALCIUM 9.6 05/03/2020 0906   ALKPHOS 65 05/03/2020 0906   AST 19 05/03/2020 0906   ALT 15 05/03/2020 0906   BILITOT 0.3 05/03/2020 0906     I reviewed his labs from today.  CBC improved, no more neutropenia. CMP reviewed and normal. TSH normal.  RADIOGRAPHIC STUDIES: I have personally reviewed the radiological images as listed and agreed with the findings in the report.  No results found.  All questions were answered. The patient knows to call the clinic with any problems, questions or concerns. I spent 20 minutes in the care of this patient including H and P, review of records, counseling and coordination of care.     Benay Pike, MD 05/03/2020 11:31 AM

## 2020-05-03 ENCOUNTER — Encounter: Payer: Self-pay | Admitting: Hematology and Oncology

## 2020-05-03 ENCOUNTER — Other Ambulatory Visit: Payer: Self-pay

## 2020-05-03 ENCOUNTER — Inpatient Hospital Stay: Payer: 59

## 2020-05-03 ENCOUNTER — Inpatient Hospital Stay (HOSPITAL_BASED_OUTPATIENT_CLINIC_OR_DEPARTMENT_OTHER): Payer: 59 | Admitting: Hematology and Oncology

## 2020-05-03 ENCOUNTER — Telehealth: Payer: Self-pay

## 2020-05-03 DIAGNOSIS — C3411 Malignant neoplasm of upper lobe, right bronchus or lung: Secondary | ICD-10-CM

## 2020-05-03 DIAGNOSIS — T451X5A Adverse effect of antineoplastic and immunosuppressive drugs, initial encounter: Secondary | ICD-10-CM

## 2020-05-03 DIAGNOSIS — D701 Agranulocytosis secondary to cancer chemotherapy: Secondary | ICD-10-CM | POA: Diagnosis not present

## 2020-05-03 DIAGNOSIS — Z5111 Encounter for antineoplastic chemotherapy: Secondary | ICD-10-CM | POA: Diagnosis not present

## 2020-05-03 LAB — CMP (CANCER CENTER ONLY)
ALT: 15 U/L (ref 0–44)
AST: 19 U/L (ref 15–41)
Albumin: 3.5 g/dL (ref 3.5–5.0)
Alkaline Phosphatase: 65 U/L (ref 38–126)
Anion gap: 9 (ref 5–15)
BUN: 12 mg/dL (ref 8–23)
CO2: 29 mmol/L (ref 22–32)
Calcium: 9.6 mg/dL (ref 8.9–10.3)
Chloride: 104 mmol/L (ref 98–111)
Creatinine: 0.99 mg/dL (ref 0.61–1.24)
GFR, Estimated: >60 mL/min (ref >60)
Glucose, Bld: 93 mg/dL (ref 70–99)
Potassium: 4.2 mmol/L (ref 3.5–5.1)
Sodium: 142 mmol/L (ref 135–145)
Total Bilirubin: 0.3 mg/dL (ref 0.3–1.2)
Total Protein: 7.1 g/dL (ref 6.5–8.1)

## 2020-05-03 LAB — MAGNESIUM: Magnesium: 1.9 mg/dL (ref 1.7–2.4)

## 2020-05-03 LAB — CBC WITH DIFFERENTIAL (CANCER CENTER ONLY)
Abs Immature Granulocytes: 0.05 10*3/uL (ref 0.00–0.07)
Basophils Absolute: 0 10*3/uL (ref 0.0–0.1)
Basophils Relative: 1 %
Eosinophils Absolute: 0.1 10*3/uL (ref 0.0–0.5)
Eosinophils Relative: 2 %
HCT: 35.7 % — ABNORMAL LOW (ref 39.0–52.0)
Hemoglobin: 11.3 g/dL — ABNORMAL LOW (ref 13.0–17.0)
Immature Granulocytes: 1 %
Lymphocytes Relative: 15 %
Lymphs Abs: 0.6 10*3/uL — ABNORMAL LOW (ref 0.7–4.0)
MCH: 27 pg (ref 26.0–34.0)
MCHC: 31.7 g/dL (ref 30.0–36.0)
MCV: 85.4 fL (ref 80.0–100.0)
Monocytes Absolute: 0.8 10*3/uL (ref 0.1–1.0)
Monocytes Relative: 20 %
Neutro Abs: 2.3 10*3/uL (ref 1.7–7.7)
Neutrophils Relative %: 61 %
Platelet Count: 278 10*3/uL (ref 150–400)
RBC: 4.18 MIL/uL — ABNORMAL LOW (ref 4.22–5.81)
RDW: 19.1 % — ABNORMAL HIGH (ref 11.5–15.5)
WBC Count: 3.8 10*3/uL — ABNORMAL LOW (ref 4.0–10.5)
nRBC: 0 % (ref 0.0–0.2)

## 2020-05-03 LAB — TSH: TSH: 1.246 u[IU]/mL (ref 0.320–4.118)

## 2020-05-03 MED ORDER — PALONOSETRON HCL INJECTION 0.25 MG/5ML
INTRAVENOUS | Status: AC
  Filled 2020-05-03: qty 5

## 2020-05-03 MED ORDER — MAGNESIUM SULFATE 2 GM/50ML IV SOLN
2.0000 g | Freq: Once | INTRAVENOUS | Status: AC
  Administered 2020-05-03: 2 g via INTRAVENOUS

## 2020-05-03 MED ORDER — SODIUM CHLORIDE 0.9% FLUSH
10.0000 mL | INTRAVENOUS | Status: DC | PRN
  Filled 2020-05-03: qty 10

## 2020-05-03 MED ORDER — POTASSIUM CHLORIDE IN NACL 20-0.9 MEQ/L-% IV SOLN
Freq: Once | INTRAVENOUS | Status: AC
  Filled 2020-05-03: qty 1000

## 2020-05-03 MED ORDER — HEPARIN SOD (PORK) LOCK FLUSH 100 UNIT/ML IV SOLN
500.0000 [IU] | Freq: Once | INTRAVENOUS | Status: DC | PRN
  Filled 2020-05-03: qty 5

## 2020-05-03 MED ORDER — SODIUM CHLORIDE 0.9 % IV SOLN
150.0000 mg | Freq: Once | INTRAVENOUS | Status: DC
  Filled 2020-05-03: qty 5

## 2020-05-03 MED ORDER — SODIUM CHLORIDE 0.9 % IV SOLN
60.0000 mg/m2 | Freq: Once | INTRAVENOUS | Status: AC
  Administered 2020-05-03: 120 mg via INTRAVENOUS
  Filled 2020-05-03: qty 120

## 2020-05-03 MED ORDER — SODIUM CHLORIDE 0.9 % IV SOLN
150.0000 mg | Freq: Once | INTRAVENOUS | Status: AC
  Administered 2020-05-03: 150 mg via INTRAVENOUS
  Filled 2020-05-03: qty 150

## 2020-05-03 MED ORDER — SODIUM CHLORIDE 0.9 % IV SOLN
120.0000 mg/m2 | Freq: Once | INTRAVENOUS | Status: AC
  Administered 2020-05-03: 240 mg via INTRAVENOUS
  Filled 2020-05-03: qty 12

## 2020-05-03 MED ORDER — SODIUM CHLORIDE 0.9 % IV SOLN
Freq: Once | INTRAVENOUS | Status: AC
  Filled 2020-05-03: qty 250

## 2020-05-03 MED ORDER — MAGNESIUM SULFATE 2 GM/50ML IV SOLN
INTRAVENOUS | Status: AC
  Filled 2020-05-03: qty 50

## 2020-05-03 MED ORDER — PALONOSETRON HCL INJECTION 0.25 MG/5ML
0.2500 mg | Freq: Once | INTRAVENOUS | Status: AC
  Administered 2020-05-03: 0.25 mg via INTRAVENOUS

## 2020-05-03 NOTE — Patient Instructions (Addendum)
[facility] ONCOLOGY  Discharge Instructions: Thank you for choosing [facility] to provide your oncology and hematology care.   If you have a lab appointment with the [facility], please go directly to the [facility] and check in at the registration area.   Wear comfortable clothing and clothing appropriate for easy access to any Portacath or PICC line.   We strive to give you quality time with your provider. You may need to reschedule your appointment if you arrive late (15 or more minutes).  Arriving late affects you and other patients whose appointments are after yours.  Also, if you miss three or more appointments without notifying the office, you may be dismissed from the clinic at the provider's discretion.      For prescription refill requests, have your pharmacy contact our office and allow 72 hours for refills to be completed.    Today you received the following chemotherapy and/or immunotherapy agent: Cisplatin and Etoposide   To help prevent nausea and vomiting after your treatment, we encourage you to take your nausea medication as directed.  BELOW ARE SYMPTOMS THAT SHOULD BE REPORTED IMMEDIATELY: . *FEVER GREATER THAN 100.4 F (38 C) OR HIGHER . *CHILLS OR SWEATING . *NAUSEA AND VOMITING THAT IS NOT CONTROLLED WITH YOUR NAUSEA MEDICATION . *UNUSUAL SHORTNESS OF BREATH . *UNUSUAL BRUISING OR BLEEDING . *URINARY PROBLEMS (pain or burning when urinating, or frequent urination) . *BOWEL PROBLEMS (unusual diarrhea, constipation, pain near the anus) . TENDERNESS IN MOUTH AND THROAT WITH OR WITHOUT PRESENCE OF ULCERS (sore throat, sores in mouth, or a toothache) . UNUSUAL RASH, SWELLING OR PAIN  . UNUSUAL VAGINAL DISCHARGE OR ITCHING   Items with * indicate a potential emergency and should be followed up as soon as possible or go to the Emergency Department if any problems should occur.  Please show the CHEMOTHERAPY ALERT CARD or  IMMUNOTHERAPY ALERT CARD at check-in to the Emergency Department and triage nurse.  Should you have questions after your visit or need to cancel or reschedule your appointment, please contact [facility]  Dept: [phone]  and follow the prompts.  Office hours are 8:00 a.m. to 4:30 p.m. Monday - Friday. Please note that voicemails left after 4:00 p.m. may not be returned until the following business day.  We are closed weekends and major holidays. You have access to a nurse at all times for urgent questions. Please call the main number to the clinic Dept: [phone] and follow the prompts.   For any non-urgent questions, you may also contact your provider using MyChart. We now offer e-Visits for anyone 83 and older to request care online for non-urgent symptoms. For details visit [email or web address].   Also download the MyChart app! Go to the app store, search "MyChart", open the app, select [facility], and log in with your MyChart username and password.  Due to Covid, a mask is required upon entering the hospital/clinic. If you do not have a mask, one will be given to you upon arrival. For doctor visits, patients may have 1 support person aged 39 or older with them. For treatment visits, patients cannot have anyone with them due to current Covid guidelines and our immunocompromised population.

## 2020-05-03 NOTE — Assessment & Plan Note (Signed)
Resolved. ANC normal today. Ok to resume chemotherapy

## 2020-05-03 NOTE — Assessment & Plan Note (Signed)
Small cell lung cancer, limited stage  LIMITED STAGE SMALL CELL LUNG CANCER (LS-SCLC): A PHASE III  RANDOMIZED STUDY OF CHEMORADIATION VERSUS CHEMORADIATION PLUS ATEZOLIZUMAB (10-June-2019)  C1D1 cis/etoposide completed on [place] 2.10.22. C2D1 completed on 03/15/2020 through 03/17/2020 Cycle 3-day 1 completed on 04/05/2020 through 04/07/2020 C4 D1 delayed by a week because of neutropenia, D1 05/03/2020  He is here for follow-up per research protocol. He is doing very well.  No concerning review of systems.  Physical examination heart rate at the upper limit of normal, otherwise no concerns. No concern for pneumonitis, pericarditis, esophagitis, encephalitis. His labs from today show resolution of leukopenia and neutropenia, hence will go ahead and proceed with C4D1 today. Will RTC in one week per trial protocol Repeat imaging ordered per protocol.   Benay Pike MD

## 2020-05-03 NOTE — Progress Notes (Signed)
Per Dr. Chryl Heck, ok to infuse post hydration fluids with Cisplatin. Verified with pharmacy and ok to infuse.

## 2020-05-03 NOTE — Assessment & Plan Note (Signed)
No Adverse effects reported from chemotherapy. Ok to proceed with chemo as planned.

## 2020-05-03 NOTE — Progress Notes (Signed)
Per Dr. Chryl Heck, Anita to treat without recent magnesium level. Gardiner Rhyme, RN

## 2020-05-03 NOTE — Research (Addendum)
[id number] TESTING THE ADDITION OF ATEZOLIZUMAB TO THE USUAL CHEMORADIATION TREATMENT FOR LIMITED STAGE SMALL CELL LUNG CANCER  Adam [name] arrives [facility] today for labs, MD visit and start cycle 3.  PROs: PROs are completed upon arrival, prior to any procedures. Questionnaire is reviewed and all questions are answered. Adam [name] reports continued intermittent tinnitus and mild fatigue.  LABS: Mandatory study labs are collected per study protocol and Adam [name] tolerates the procedure well without complaint.  VITAL SIGNS: Vital signs are collected per protocol.    MEDICATION REVIEW: Adam [name] reviews the current med list and verifies the list as correct.   MD VISIT: Adam [name] sees Dr. Chryl Heck today: he denies any complaints. Labs reveal resolution of prior neutropenia and Adam [name] is cleared to initiate cycle 3 today.  ADVERSE EVENTS: Adam [name] has a baseline history of hypertension, anemia and a minor cough, all present prior to the pre-registration cycle of treatment and none of which have increased in grade (all grade 1, CTCAE 5.0). His cough has resolved.   Today's lab results reveal grade 1 white blood cell count decreased and grade 1 anemia: neither are considered clinically significant and per Dr. Chryl Heck, treatment will proceed as planned today.   Adam [name] reports mild intermittent ringing in his ears and mild fatigue that resolves with rest.   Event Grade Onset Date End Date Status Comments Attribution to Cisplatin Attribution to Etoposide  Tinnitus 1 03/29/2020  Ongoing No intervention required X   Unrelated o Unlikely o Possible o Probable o Definite  X   Unrelated o Unlikely o Possible o Probable o Definite   Decreased white blood cell (WBC) count 2 04/21/2020 05/03/2020 Resolved No intervention required o Unrelated o Unlikely o Possible o Probable X   Definite  o Unrelated o Unlikely o Possible o Probable X   Definite   Decreased white blood cell (WBC) count 1 05/03/2020  Ongoing No intervention  required o Unrelated o Unlikely o Possible o Probable X   Definite  o Unrelated o Unlikely o Possible o Probable X   Definite   Decreased neutrophil count 3 04/25/2020 05/03/2020 Resolved No intervention required o Unrelated o Unlikely o Possible o Probable X   Definite  o Unrelated o Unlikely o Possible o Probable X   Definite   Non-cardiac chest pain 1 04/25/2020 05/03/2020 Resolved No intervention required X   Unrelated o Unlikely o Possible o Probable o Definite  X   Unrelated o Unlikely o Possible o Probable o Definite   Sinus tachycardia 1 04/25/2020 05/03/2020 Resolved No intervention required o Unrelated X   Unlikely o Possible o Probable o Definite  o Unrelated X   Unlikely o Possible o Probable o Definite   Fatigue 1 03/22/2020  Ongoing Relieved by rest o Unrelated o Unlikely X    Possible o Probable o Definite  o Unrelated o Unlikely X    Possible o Probable o Definite     PLAN: Current plan is for Adam [name] to start cycle 4 today ( he got his C1 before enrolling in the trial) and return to the cancer center tomorrow for day 2 etoposide. Next MD/lab visit is scheduled for 05/10/2020.   Adam [name], BSN, RN, CIC 05/03/2020 11:51 AM   05/05/2020 Fatigue attribution has been changed from prior "unrelated" to "possible" per Dr. Chryl Heck. Change is reflected on AE table.  Adam [name], BSN, RN, CIC 05/09/2020 11:24 AM

## 2020-05-03 NOTE — Telephone Encounter (Signed)
Per Dr. Chryl Heck - okay to proceed without magnesium level today. Also, okay to run post-hydration fluids with cisplatin infusion.

## 2020-05-04 ENCOUNTER — Inpatient Hospital Stay: Payer: 59

## 2020-05-04 ENCOUNTER — Telehealth: Payer: Self-pay | Admitting: Hematology and Oncology

## 2020-05-04 VITALS — BP 130/96 | HR 96 | Temp 98.4°F | Resp 17

## 2020-05-04 DIAGNOSIS — Z5111 Encounter for antineoplastic chemotherapy: Secondary | ICD-10-CM | POA: Diagnosis not present

## 2020-05-04 DIAGNOSIS — C3411 Malignant neoplasm of upper lobe, right bronchus or lung: Secondary | ICD-10-CM

## 2020-05-04 MED ORDER — SODIUM CHLORIDE 0.9 % IV SOLN
120.0000 mg/m2 | Freq: Once | INTRAVENOUS | Status: AC
  Administered 2020-05-04: 240 mg via INTRAVENOUS
  Filled 2020-05-04: qty 12

## 2020-05-04 MED ORDER — SODIUM CHLORIDE 0.9 % IV SOLN
Freq: Once | INTRAVENOUS | Status: AC
  Filled 2020-05-04: qty 250

## 2020-05-04 NOTE — Telephone Encounter (Signed)
Scheduled appts per 4/26 los. Called pt, no answer. Left msg with appts date and times.

## 2020-05-04 NOTE — Patient Instructions (Signed)
[facility] ONCOLOG[facility]  Discharge Instructions: Thank you for choosing [facility] to provide your oncology and hematology care.   If you have a lab appointment with the [facility], please go directly to the [facility] and check in at the registration area.   Wear comfortable clothing and clothing appropriate for easy access to any Portacath or PICC line.   We strive to give you quality time with your provider. You may need to reschedule your appointment if you arrive late (15 or more minutes).  Arriving late affects you and other patients whose appointments are after yours.  Also, if you miss three or more appointments without notifying the office, you may be dismissed from the clinic at the provider's discretion.      For prescription refill requests, have your pharmacy contact our office and allow 72 hours for refills to be completed.    Today you received the following chemotherapy and/or immunotherapy agents Etoposide     To help prevent nausea and vomiting after your treatment, we encourage you to take your nausea medication as directed.  BELOW ARE SYMPTOMS THAT SHOULD BE REPORTED IMMEDIATELY: . *FEVER GREATER THAN 100.4 F (38 C) OR HIGHER . *CHILLS OR SWEATING . *NAUSEA AND VOMITING THAT IS NOT CONTROLLED WITH YOUR NAUSEA MEDICATION . *UNUSUAL SHORTNESS OF BREATH . *UNUSUAL BRUISING OR BLEEDING . *URINARY PROBLEMS (pain or burning when urinating, or frequent urination) . *BOWEL PROBLEMS (unusual diarrhea, constipation, pain near the anus) . TENDERNESS IN MOUTH AND THROAT WITH OR WITHOUT PRESENCE OF ULCERS (sore throat, sores in mouth, or a toothache) . UNUSUAL RASH, SWELLING OR PAIN  . UNUSUAL VAGINAL DISCHARGE OR ITCHING   Items with * indicate a potential emergency and should be followed up as soon as possible or go to the Emergency Department if any problems should occur.  Please show the CHEMOTHERAPY ALERT CARD or IMMUNOTHERAPY ALERT  CARD at check-in to the Emergency Department and triage nurse.  Should you have questions after your visit or need to cancel or reschedule your appointment, please contact [facility]  Dept: [phone]  and follow the prompts.  Office hours are 8:00 a.m. to 4:30 p.m. Monday - Friday. Please note that voicemails left after 4:00 p.m. may not be returned until the following business day.  We are closed weekends and major holidays. You have access to a nurse at all times for urgent questions. Please call the main number to the clinic Dept: [phone] and follow the prompts.   For any non-urgent questions, you may also contact your provider using MyChart. We now offer e-Visits for anyone 91 and older to request care online for non-urgent symptoms. For details visit [email or web address].   Also download the MyChart app! Go to the app store, search "MyChart", open the app, select [facility], and log in with your MyChart username and password.  Due to Covid, a mask is required upon entering the hospital/clinic. If you do not have a mask, one will be given to you upon arrival. For doctor visits, patients may have 1 support person aged 47 or older with them. For treatment visits, patients cannot have anyone with them due to current Covid guidelines and our immunocompromised population.

## 2020-05-05 ENCOUNTER — Other Ambulatory Visit: Payer: Self-pay

## 2020-05-05 ENCOUNTER — Inpatient Hospital Stay: Payer: 59

## 2020-05-05 VITALS — BP 128/87 | HR 92 | Temp 98.2°F | Resp 17

## 2020-05-05 DIAGNOSIS — C3411 Malignant neoplasm of upper lobe, right bronchus or lung: Secondary | ICD-10-CM

## 2020-05-05 DIAGNOSIS — Z5111 Encounter for antineoplastic chemotherapy: Secondary | ICD-10-CM | POA: Diagnosis not present

## 2020-05-05 MED ORDER — SODIUM CHLORIDE 0.9 % IV SOLN
Freq: Once | INTRAVENOUS | Status: AC
  Filled 2020-05-05: qty 250

## 2020-05-05 MED ORDER — SODIUM CHLORIDE 0.9 % IV SOLN
120.0000 mg/m2 | Freq: Once | INTRAVENOUS | Status: AC
  Administered 2020-05-05: 240 mg via INTRAVENOUS
  Filled 2020-05-05: qty 12

## 2020-05-05 NOTE — Patient Instructions (Signed)
[facility] ONCOLOG[facility]  Discharge Instructions: Thank you for choosing [facility] to provide your oncology and hematology care.   If you have a lab appointment with the [facility], please go directly to the [facility] and check in at the registration area.   Wear comfortable clothing and clothing appropriate for easy access to any Portacath or PICC line.   We strive to give you quality time with your provider. You may need to reschedule your appointment if you arrive late (15 or more minutes).  Arriving late affects you and other patients whose appointments are after yours.  Also, if you miss three or more appointments without notifying the office, you may be dismissed from the clinic at the provider's discretion.      For prescription refill requests, have your pharmacy contact our office and allow 72 hours for refills to be completed.    Today you received the following chemotherapy and/or immunotherapy agent: Etoposide   To help prevent nausea and vomiting after your treatment, we encourage you to take your nausea medication as directed.  BELOW ARE SYMPTOMS THAT SHOULD BE REPORTED IMMEDIATELY: . *FEVER GREATER THAN 100.4 F (38 C) OR HIGHER . *CHILLS OR SWEATING . *NAUSEA AND VOMITING THAT IS NOT CONTROLLED WITH YOUR NAUSEA MEDICATION . *UNUSUAL SHORTNESS OF BREATH . *UNUSUAL BRUISING OR BLEEDING . *URINARY PROBLEMS (pain or burning when urinating, or frequent urination) . *BOWEL PROBLEMS (unusual diarrhea, constipation, pain near the anus) . TENDERNESS IN MOUTH AND THROAT WITH OR WITHOUT PRESENCE OF ULCERS (sore throat, sores in mouth, or a toothache) . UNUSUAL RASH, SWELLING OR PAIN  . UNUSUAL VAGINAL DISCHARGE OR ITCHING   Items with * indicate a potential emergency and should be followed up as soon as possible or go to the Emergency Department if any problems should occur.  Please show the CHEMOTHERAPY ALERT CARD or IMMUNOTHERAPY ALERT CARD  at check-in to the Emergency Department and triage nurse.  Should you have questions after your visit or need to cancel or reschedule your appointment, please contact [facility]  Dept: [phone]  and follow the prompts.  Office hours are 8:00 a.m. to 4:30 p.m. Monday - Friday. Please note that voicemails left after 4:00 p.m. may not be returned until the following business day.  We are closed weekends and major holidays. You have access to a nurse at all times for urgent questions. Please call the main number to the clinic Dept: [phone] and follow the prompts.   For any non-urgent questions, you may also contact your provider using MyChart. We now offer e-Visits for anyone 53 and older to request care online for non-urgent symptoms. For details visit [email or web address].   Also download the MyChart app! Go to the app store, search "MyChart", open the app, select [facility], and log in with your MyChart username and password.  Due to Covid, a mask is required upon entering the hospital/clinic. If you do not have a mask, one will be given to you upon arrival. For doctor visits, patients may have 1 support person aged 56 or older with them. For treatment visits, patients cannot have anyone with them due to current Covid guidelines and our immunocompromised population.

## 2020-05-10 ENCOUNTER — Encounter: Payer: Self-pay | Admitting: Hematology and Oncology

## 2020-05-10 ENCOUNTER — Inpatient Hospital Stay: Payer: 59 | Attending: Hematology and Oncology

## 2020-05-10 ENCOUNTER — Inpatient Hospital Stay (HOSPITAL_BASED_OUTPATIENT_CLINIC_OR_DEPARTMENT_OTHER): Payer: 59 | Admitting: Hematology and Oncology

## 2020-05-10 ENCOUNTER — Other Ambulatory Visit: Payer: Self-pay

## 2020-05-10 ENCOUNTER — Ambulatory Visit: Payer: 59

## 2020-05-10 DIAGNOSIS — Z79899 Other long term (current) drug therapy: Secondary | ICD-10-CM | POA: Diagnosis not present

## 2020-05-10 DIAGNOSIS — R5383 Other fatigue: Secondary | ICD-10-CM | POA: Insufficient documentation

## 2020-05-10 DIAGNOSIS — T451X5A Adverse effect of antineoplastic and immunosuppressive drugs, initial encounter: Secondary | ICD-10-CM | POA: Insufficient documentation

## 2020-05-10 DIAGNOSIS — C3411 Malignant neoplasm of upper lobe, right bronchus or lung: Secondary | ICD-10-CM | POA: Diagnosis present

## 2020-05-10 DIAGNOSIS — Z87891 Personal history of nicotine dependence: Secondary | ICD-10-CM | POA: Diagnosis not present

## 2020-05-10 DIAGNOSIS — I10 Essential (primary) hypertension: Secondary | ICD-10-CM | POA: Insufficient documentation

## 2020-05-10 DIAGNOSIS — Z809 Family history of malignant neoplasm, unspecified: Secondary | ICD-10-CM | POA: Insufficient documentation

## 2020-05-10 DIAGNOSIS — D701 Agranulocytosis secondary to cancer chemotherapy: Secondary | ICD-10-CM

## 2020-05-10 DIAGNOSIS — D649 Anemia, unspecified: Secondary | ICD-10-CM | POA: Diagnosis not present

## 2020-05-10 DIAGNOSIS — Z006 Encounter for examination for normal comparison and control in clinical research program: Secondary | ICD-10-CM | POA: Diagnosis present

## 2020-05-10 DIAGNOSIS — D72819 Decreased white blood cell count, unspecified: Secondary | ICD-10-CM | POA: Insufficient documentation

## 2020-05-10 DIAGNOSIS — D702 Other drug-induced agranulocytosis: Secondary | ICD-10-CM | POA: Insufficient documentation

## 2020-05-10 LAB — CMP (CANCER CENTER ONLY)
ALT: 15 U/L (ref 0–44)
AST: 18 U/L (ref 15–41)
Albumin: 3.5 g/dL (ref 3.5–5.0)
Alkaline Phosphatase: 63 U/L (ref 38–126)
Anion gap: 7 (ref 5–15)
BUN: 15 mg/dL (ref 8–23)
CO2: 27 mmol/L (ref 22–32)
Calcium: 9.4 mg/dL (ref 8.9–10.3)
Chloride: 106 mmol/L (ref 98–111)
Creatinine: 0.88 mg/dL (ref 0.61–1.24)
GFR, Estimated: >60 mL/min (ref >60)
Glucose, Bld: 98 mg/dL (ref 70–99)
Potassium: 3.9 mmol/L (ref 3.5–5.1)
Sodium: 140 mmol/L (ref 135–145)
Total Bilirubin: 0.4 mg/dL (ref 0.3–1.2)
Total Protein: 7 g/dL (ref 6.5–8.1)

## 2020-05-10 LAB — CBC WITH DIFFERENTIAL (CANCER CENTER ONLY)
Abs Immature Granulocytes: 0 10*3/uL (ref 0.00–0.07)
Basophils Absolute: 0 10*3/uL (ref 0.0–0.1)
Basophils Relative: 1 %
Eosinophils Absolute: 0.1 10*3/uL (ref 0.0–0.5)
Eosinophils Relative: 6 %
HCT: 31.3 % — ABNORMAL LOW (ref 39.0–52.0)
Hemoglobin: 10.2 g/dL — ABNORMAL LOW (ref 13.0–17.0)
Immature Granulocytes: 0 %
Lymphocytes Relative: 19 %
Lymphs Abs: 0.3 10*3/uL — ABNORMAL LOW (ref 0.7–4.0)
MCH: 27.1 pg (ref 26.0–34.0)
MCHC: 32.6 g/dL (ref 30.0–36.0)
MCV: 83 fL (ref 80.0–100.0)
Monocytes Absolute: 0.1 10*3/uL (ref 0.1–1.0)
Monocytes Relative: 5 %
Neutro Abs: 1.1 10*3/uL — ABNORMAL LOW (ref 1.7–7.7)
Neutrophils Relative %: 69 %
Platelet Count: 163 10*3/uL (ref 150–400)
RBC: 3.77 MIL/uL — ABNORMAL LOW (ref 4.22–5.81)
RDW: 18.1 % — ABNORMAL HIGH (ref 11.5–15.5)
WBC Count: 1.6 10*3/uL — ABNORMAL LOW (ref 4.0–10.5)
nRBC: 0 % (ref 0.0–0.2)

## 2020-05-10 LAB — TSH: TSH: 1.207 u[IU]/mL (ref 0.350–4.500)

## 2020-05-10 NOTE — Research (Signed)
[id number] TESTING THE ADDITION OF ATEZOLIZUMAB TO THE USUAL CHEMORADIATION TREATMENT FOR LIMITED STAGE SMALL CELL LUNG CANCER  Adam [name] arrives [facility] today for labs and on-study C3D8 visit.  PROs: PROs are completed upon arrival, prior to any procedures. Questionnaire is reviewed and all questions are answered. Adam [name] reports continued intermittent tinnitus and mild fatigue this week.  LABS: Mandatory study labs are collected per study protocol and Jessie tolerates the procedure well without complaint.  VITAL SIGNS: Vital signs are collected per protocol. Adam [name] has a history of hypertension and takes his blood pressure medications in the waiting area just prior to having vital signs collected. His blood pressure is 168/101 prior to the MD visit. At the end of the visit, his blood pressure is checked again and is 153/104. He states this is not unusual and it will come down since he has taken his medication.    MEDICATION REVIEW: Adam [name] reviews the current med list and verifies the list as correct. He does report taking one prochlorperazine tablet on day 1 of his last cycle (05/03/20) for mild nausea. He has not required any additional medications. This medication was previously discontinued so it has been reactivated on his med list.  MD VISIT: Conley sees Dr. Chryl Heck today: he denies any complaints.   ADVERSE EVENTS: Adam [name] has a baseline history of hypertension, anemia and a minor cough, all present prior to the pre-registration cycle of treatment and none of which have increased in grade (all grade 1, CTCAE 5.0). His cough has resolved.   Today's lab results reveal a grade 3 white blood cell count decreased and grade 2 decreased neutrophil count. He continues to have a grade 1 baseline anemia. None of these values are considered clinically significant or require intervention.    Adam [name] reports the same mild intermittent ringing in his ears and mild fatigue that resolves with rest.   AE TABLE:  Non-reportable in italics.  Event Grade Onset Date End Date Status Comments Attribution to Cisplatin Attribution to Etoposide  Tinnitus 1 03/29/2020  Ongoing No intervention required X   Unrelated o Unlikely o Possible o Probable o Definite  X   Unrelated o Unlikely o Possible o Probable o Definite   Decreased white blood cell (WBC) count 3 05/10/2020  Ongoing No intervention required o Unrelated o Unlikely o Possible o Probable X   Definite  o Unrelated o Unlikely o Possible o Probable X   Definite   Decreased white blood cell (WBC) count 1 05/03/2020 05/10/2020 Ongoing No intervention required o Unrelated o Unlikely o Possible o Probable X   Definite  o Unrelated o Unlikely o Possible o Probable X   Definite   Decreased neutrophil count 2 05/09/2020  Ongoing No intervention required o Unrelated o Unlikely o Possible o Probable X   Definite  o Unrelated o Unlikely o Possible o Probable X   Definite   Fatigue 1 03/22/2020  Ongoing Relieved by rest o Unrelated o Unlikely X    Possible o Probable o Definite  o Unrelated o Unlikely X    Possible o Probable o Definite   Nausea 1 05/03/2020 05/03/2020 Resolved Treated with prochlorperazine 10mg  x1  o Unrelated o Unlikely X    Possible o Probable o Definite  o Unrelated o Unlikely X    Possible o Probable o Definite     PLAN: Current plan is for Stetson to return for his on-study C3D15 visit next Tuesday, 05/17/20. Labs and AEs will be assessed at that visit  per study protocol.  [name]. Sharlett Iles, BSN, RN, CIC 05/11/2020 1:48 PM

## 2020-05-10 NOTE — Progress Notes (Signed)
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 C2D1 completed on 03/15/2020 through 03/17/2020 Cycle 3-day 1 completed on 04/05/2020 through 04/07/2020 C4 D1 completed 4/26-4/28  He is here for follow-up per research protocol. He is doing very well, some fatigue, no other complaints PE unremarkable, VS show HTN, patient just took his anti hypertensive. Will RTC in one week per trial protocol Repeat imaging ordered per protocol, scheduled for 5/17    Chemotherapy induced neutropenia ([facility]) CBC and labs reviewed today Ongoing leukopenia and neutropenia likely from chemotherapy last week He will RTC in one week, we will monitor his labs.  No orders of the defined types were placed in this encounter.  Thank you for consulting [place] in the care of this patient.  Please not hesitate to contact us with any additional questions or concerns.  HISTORY OF PRESENTING ILLNESS:   Adam [name] 65 y.o. male is here because of  Follow up of limited stage small cell lung cancer.  He is currently on the following trial. Please refer to my original note for complete history.  LIMITED STAGE SMALL CELL LUNG CANCER (LS-SCLC): A PHASE III  RANDOMIZED STUDY OF CHEMORADIATION VERSUS CHEMORADIATION PLUS ATEZOLIZUMAB (10-June-2019)  C1 D1 02/16/2020-02/18/2020 He has consented for the trial and is enrolled to the arm without immunotherapy. C2 D1-D3  03/15/2020-03/17/2020 C3D1 completed 04/05/2020 through 04/07/2020 C4D1 delayed by a week because of neutropenia. C4D1 completed 4/26-22-05/05/2020  Interim History  Mr. Struckman is here for follow-up after completion of chemotherapy He just feels tired because he had a busy week, his brother from [place] was here. He denies any cough, chest pain, SOB, hemoptysis He is eating well, weight improved, gained 4.5 lbs. No change in bowel or urinary habits. No new neurological complaints. No concern for pneumonitis, pericarditis, esophagitis, encephalitis. Rest of the pertinent 10 point ROS reviewed and negative.  REVIEW OF SYSTEMS:    Constitutional: Denies fevers, chills or abnormal night sweats Eyes: Denies blurriness of vision, double vision or watery eyes Ears, nose, mouth, throat, and face: Denies mucositis or sore throat Respiratory: Denies cough, dyspnea or wheezes Cardiovascular: Denies palpitation, chest discomfort or lower extremity swelling Gastrointestinal:  Denies nausea, heartburn or change in bowel habits Skin: Denies abnormal skin rashes Lymphatics: Denies new lymphadenopathy or easy bruising Neurological:Denies numbness, tingling or new weaknesses Behavioral/Psych: Mood is stable, no new changes  All other systems were reviewed with the patient and are negative.  MEDICAL HISTORY:  Past Medical History:  Diagnosis Date  . Hypertension   . Lung cancer ([facility])    small cell RUL    SURGICAL HISTORY: Past Surgical History:  Procedure Laterality Date  . BRONCHIAL BRUSHINGS  02/08/2020   Procedure: BRONCHIAL BRUSHINGS;  Surgeon: Freddi Starr, MD;  Location: [facility];  Service: Pulmonary;;  . BRONCHIAL NEEDLE ASPIRATION BIOPSY  02/08/2020   Procedure: BRONCHIAL NEEDLE ASPIRATION BIOPSIES;  Surgeon: Freddi Starr, MD;  Location: [facility] ENDOSCOPY;  Service: Pulmonary;;  . KNEE SURGERY Right   . SHOULDER  SURGERY Left   . VIDEO BRONCHOSCOPY WITH ENDOBRONCHIAL ULTRASOUND  N/A 02/08/2020   Procedure: VIDEO BRONCHOSCOPY WITH ENDOBRONCHIAL ULTRASOUND;  Surgeon: Freddi Starr, MD;  Location: [facility];  Service: Pulmonary;  Laterality: N/A;    SOCIAL HISTORY: Social History   Socioeconomic History  . Marital status: Single    Spouse name: [name]  . Number of children: Not on file  . Years of education: Not on file  . Highest education level: Not on file  Occupational History  . Not on file  Tobacco Use  . Smoking status: Former Smoker    Packs/day: 1.00    Years: 20.00    Pack years: 20.00    Types: Cigarettes    Quit date: 06/09/2018    Years since quitting: 1.9  . Smokeless tobacco: Never Used  Vaping Use  . Vaping Use: Never used  Substance and Sexual Activity  . Alcohol use: Not Currently  . Drug use: Never  . Sexual activity: Yes  Other Topics Concern  . Not on file  Social History Narrative  . Not on file   Social Determinants of Health   Financial Resource Strain: Not on file  Food Insecurity: Not on file  Transportation Needs: Not on file  Physical Activity: Not on file  Stress: Not on file  Social Connections: Not on file  Intimate Partner Violence: Not on file    FAMILY HISTORY: Family History  Problem Relation Age of Onset  . Heart disease Mother   . Cancer Cousin   . Breast cancer Neg Hx   . Colon cancer Neg Hx   . Pancreatic cancer Neg Hx   . Prostate cancer Neg Hx     ALLERGIES:  has No Known Allergies.  MEDICATIONS:  Current Outpatient Medications  Medication Sig Dispense Refill  . amLODipine (NORVASC) 10 MG tablet Take 10 mg by mouth daily.    Marland Kitchen ibuprofen (ADVIL) 600 MG tablet Take 600 mg by mouth every 6 (six) hours as needed.    Marland Kitchen lisinopril-hydrochlorothiazide (ZESTORETIC) 20-12.5 MG tablet Take 1 tablet by mouth daily.    . prochlorperazine (COMPAZINE) 10 MG tablet Take 10 mg by mouth every 6 (six) hours as needed for nausea or vomiting.     No current facility-administered medications for this  visit.    PHYSICAL EXAMINATION:  ECOG PERFORMANCE STATUS: 0 - Asymptomatic  Vitals:   05/10/20 0922  BP: (!) 168/101  Pulse: 89  Resp: 18  Temp: 97.8 F (36.6 C)  SpO2: 99%   Filed Weights   05/10/20 0922  Weight: 194 lb 8 oz (88.2 kg)   GENERAL:alert, no distress and comfortable,  SKIN: skin color, texture, turgor are normal, no rashes or significant lesions EYES: normal, conjunctiva are pink and non-injected, sclera clear OROPHARYNX:no exudate, no erythema and lips, buccal mucosa, and tongue normal  NECK: supple, thyroid normal size, non-tender, without nodularity LYMPH:  no palpable lymphadenopathy in the cervical, axillary or inguinal LUNGS: clear to auscultation and percussion with normal breathing effort HEART: Sinus tachycardia, no murmurs, no lower extremity edema.   ABDOMEN:abdomen soft, non-tender and normal bowel sounds Musculoskeletal:no cyanosis of digits and no clubbing  PSYCH: alert & oriented x 3 with fluent speech NEURO: no focal motor/sensory deficits . LABORATORY DATA:  I have reviewed the data as listed Lab Results  Component Value Date   WBC 1.6 (L) 05/10/2020   HGB 10.2 (L) 05/10/2020   HCT 31.3 (L) 05/10/2020   MCV 83.0 05/10/2020   PLT  163 05/10/2020     Chemistry      Component Value Date/Time   NA 140 05/10/2020 0854   K 3.9 05/10/2020 0854   CL 106 05/10/2020 0854   CO2 27 05/10/2020 0854   BUN 15 05/10/2020 0854   CREATININE 0.88 05/10/2020 0854      Component Value Date/Time   CALCIUM 9.4 05/10/2020 0854   ALKPHOS 63 05/10/2020 0854   AST 18 05/10/2020 0854   ALT 15 05/10/2020 0854   BILITOT 0.4 05/10/2020 0854     I reviewed his labs from today.  Ongoing leukopenia and neutropenia. Normocytic normochromic anemia. Platelet count is normal.  RADIOGRAPHIC STUDIES: I have personally reviewed the radiological images as listed and agreed with the findings in the report.  No results found.  All questions were answered. The  patient knows to call the clinic with any problems, questions or concerns. I spent 20 minutes in the care of this patient including H and P, review of records, counseling and coordination of care.     Benay Pike, MD 05/10/2020 1:17 PM

## 2020-05-10 NOTE — Assessment & Plan Note (Addendum)
Small cell lung cancer, limited stage  LIMITED STAGE SMALL CELL LUNG CANCER (LS-SCLC): A PHASE III  RANDOMIZED STUDY OF CHEMORADIATION VERSUS CHEMORADIATION PLUS ATEZOLIZUMAB (10-June-2019)  C1D1 cis/etoposide completed on oh 2.10.22. C2D1 completed on 03/15/2020 through 03/17/2020 Cycle 3-day 1 completed on 04/05/2020 through 04/07/2020 C4 D1 completed 4/26-4/28  He is here for follow-up per research protocol. He is doing very well, some fatigue, no other complaints PE unremarkable, VS show HTN, patient just took his anti hypertensive. Will RTC in one week per trial protocol Repeat imaging ordered per protocol, scheduled for 5/17

## 2020-05-10 NOTE — Assessment & Plan Note (Signed)
CBC and labs reviewed today Ongoing leukopenia and neutropenia likely from chemotherapy last week He will RTC in one week, we will monitor his labs.

## 2020-05-11 ENCOUNTER — Ambulatory Visit: Payer: 59

## 2020-05-12 ENCOUNTER — Ambulatory Visit: Payer: 59

## 2020-05-17 ENCOUNTER — Encounter: Payer: Self-pay | Admitting: Hematology and Oncology

## 2020-05-17 ENCOUNTER — Encounter: Payer: Self-pay | Admitting: Emergency Medicine

## 2020-05-17 ENCOUNTER — Encounter: Payer: 59 | Admitting: Emergency Medicine

## 2020-05-17 ENCOUNTER — Inpatient Hospital Stay: Payer: 59

## 2020-05-17 ENCOUNTER — Inpatient Hospital Stay (HOSPITAL_BASED_OUTPATIENT_CLINIC_OR_DEPARTMENT_OTHER): Payer: 59 | Admitting: Hematology and Oncology

## 2020-05-17 ENCOUNTER — Other Ambulatory Visit: Payer: Self-pay

## 2020-05-17 DIAGNOSIS — Z006 Encounter for examination for normal comparison and control in clinical research program: Secondary | ICD-10-CM | POA: Diagnosis not present

## 2020-05-17 DIAGNOSIS — C3411 Malignant neoplasm of upper lobe, right bronchus or lung: Secondary | ICD-10-CM

## 2020-05-17 DIAGNOSIS — T451X5A Adverse effect of antineoplastic and immunosuppressive drugs, initial encounter: Secondary | ICD-10-CM | POA: Diagnosis not present

## 2020-05-17 DIAGNOSIS — D701 Agranulocytosis secondary to cancer chemotherapy: Secondary | ICD-10-CM | POA: Diagnosis not present

## 2020-05-17 LAB — CMP (CANCER CENTER ONLY)
ALT: 11 U/L (ref 0–44)
AST: 17 U/L (ref 15–41)
Albumin: 3.7 g/dL (ref 3.5–5.0)
Alkaline Phosphatase: 65 U/L (ref 38–126)
Anion gap: 7 (ref 5–15)
BUN: 14 mg/dL (ref 8–23)
CO2: 29 mmol/L (ref 22–32)
Calcium: 9.6 mg/dL (ref 8.9–10.3)
Chloride: 105 mmol/L (ref 98–111)
Creatinine: 1.09 mg/dL (ref 0.61–1.24)
GFR, Estimated: >60 mL/min (ref >60)
Glucose, Bld: 80 mg/dL (ref 70–99)
Potassium: 4 mmol/L (ref 3.5–5.1)
Sodium: 141 mmol/L (ref 135–145)
Total Bilirubin: 0.3 mg/dL (ref 0.3–1.2)
Total Protein: 7.1 g/dL (ref 6.5–8.1)

## 2020-05-17 LAB — CBC WITH DIFFERENTIAL (CANCER CENTER ONLY)
Abs Immature Granulocytes: 0.01 10*3/uL (ref 0.00–0.07)
Basophils Absolute: 0 10*3/uL (ref 0.0–0.1)
Basophils Relative: 1 %
Eosinophils Absolute: 0.1 10*3/uL (ref 0.0–0.5)
Eosinophils Relative: 6 %
HCT: 32.1 % — ABNORMAL LOW (ref 39.0–52.0)
Hemoglobin: 10.2 g/dL — ABNORMAL LOW (ref 13.0–17.0)
Immature Granulocytes: 1 %
Lymphocytes Relative: 18 %
Lymphs Abs: 0.4 10*3/uL — ABNORMAL LOW (ref 0.7–4.0)
MCH: 27.3 pg (ref 26.0–34.0)
MCHC: 31.8 g/dL (ref 30.0–36.0)
MCV: 86.1 fL (ref 80.0–100.0)
Monocytes Absolute: 0.2 10*3/uL (ref 0.1–1.0)
Monocytes Relative: 12 %
Neutro Abs: 1.3 10*3/uL — ABNORMAL LOW (ref 1.7–7.7)
Neutrophils Relative %: 62 %
Platelet Count: 87 10*3/uL — ABNORMAL LOW (ref 150–400)
RBC: 3.73 MIL/uL — ABNORMAL LOW (ref 4.22–5.81)
RDW: 18.6 % — ABNORMAL HIGH (ref 11.5–15.5)
WBC Count: 2.1 10*3/uL — ABNORMAL LOW (ref 4.0–10.5)
nRBC: 0 % (ref 0.0–0.2)

## 2020-05-17 LAB — TSH: TSH: 0.899 u[IU]/mL (ref 0.320–4.118)

## 2020-05-17 NOTE — Progress Notes (Signed)
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 C2D1 completed on 03/15/2020 through 03/17/2020 Cycle 3-day 1 completed on 04/05/2020 through 04/07/2020 C4 D1 completed 4/26-4/28  He is following up weekly per protocol. He is doing well, no concerning ROS, itching episode is likely from folliculitis, unrelated to treatment. PE unremarkable. No concerns. Labs next week, Imaging and FU next week. If no concern on imaging, then we will plan to FU every 3 months.  Chemotherapy induced neutropenia (HCC) Improving, thrombocytopenia is also likely from treatment No indication for intevention Will repeat labs next week and FU  No orders of the defined types were placed in this encounter.  Thank you for consulting [place] in the care of this patient.  Please not hesitate to contact us with any additional questions or concerns.  HISTORY OF PRESENTING ILLNESS:   KDYN [name] 65 y.o. male is here because of  Follow up of limited stage small cell lung cancer.  Oncology History Overview Note  He is currently on the following trial. Please refer to my original note for complete history.  LIMITED STAGE SMALL CELL LUNG CANCER (LS-SCLC): A PHASE III  RANDOMIZED STUDY OF CHEMORADIATION VERSUS CHEMORADIATION PLUS ATEZOLIZUMAB (10-June-2019)  C1 D1 02/16/2020-02/18/2020 He has consented for the trial and is enrolled to the arm without immunotherapy. C2 D1-D3  03/15/2020-03/17/2020 C3D1 completed 04/05/2020 through 04/07/2020 C4D1 delayed by a week because of neutropenia. C4D1 completed 4/26-22-05/05/2020   Primary cancer of right upper lobe of lung ([place])  02/06/2020 Initial Diagnosis   Primary cancer of right upper lobe of lung ([place])   02/10/2020 Cancer Staging   Staging form: Lung, AJCC 8th Edition - Pathologic stage from 02/10/2020: No Stage Recommended (cT1c, cN2, cM0) - Signed by Benay Pike, MD on 02/10/2020 Histopathologic type: Small cell carcinoma, NOS Stage prefix: Initial diagnosis Histologic grade (G): GX Histologic grading system: 4 grade system   Small cell lung cancer, 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[place])  02/10/2020 Initial Diagnosis   Small cell lung cancer in adult [facility])   02/16/2020 - 02/18/2020 Chemotherapy         03/15/2020 -  Chemotherapy    Patient is on Treatment Plan: LUNG SMALL CELL - LIMITED STAGE RESEARCH [id number] CISPLATIN D1 / ETOPOSIDE D1-3 ARM 1        Interim History  Adam [name] is here for follow-up after completion of chemotherapy. Fatigue is manageable, he took it easy this week because he had a busy week when his brother was around last week. No changes with breathing, no cough, shortness of breath or hemoptysis.  No change in bowel habits.  No difficulty swallowing.  Urinating well.  He has noted a couple episodes of itching on the right side of his chest, has been applying some emollient prescribed to him by radiation oncology.  No overt skin rash.  Rest of the pertinent 10 point ROS reviewed and negative.  REVIEW OF SYSTEMS:    Constitutional: Denies fevers, chills or abnormal night sweats Eyes: Denies blurriness of vision,  double vision or watery eyes Ears, nose, mouth, throat, and face: Denies mucositis or sore throat Respiratory: Denies cough, dyspnea or wheezes Cardiovascular: Denies palpitation, chest discomfort or lower extremity swelling Gastrointestinal:  Denies nausea, heartburn or change in bowel  habits Skin: Denies abnormal skin rashes Lymphatics: Denies new lymphadenopathy or easy bruising Neurological:Denies numbness, tingling or new weaknesses Behavioral/Psych: Mood is stable, no new changes  All other systems were reviewed with the patient and are negative.  MEDICAL HISTORY:  Past Medical History:  Diagnosis Date  . Hypertension   . Lung cancer ([place])    small cell RUL    SURGICAL HISTORY: Past Surgical History:  Procedure Laterality Date  . BRONCHIAL BRUSHINGS  02/08/2020   Procedure: BRONCHIAL BRUSHINGS;  Surgeon: Freddi Starr, MD;  Location: [facility];  Service: Pulmonary;;  . BRONCHIAL NEEDLE ASPIRATION BIOPSY  02/08/2020   Procedure: BRONCHIAL NEEDLE ASPIRATION BIOPSIES;  Surgeon: Freddi Starr, MD;  Location: [facility] ENDOSCOPY;  Service: Pulmonary;;  . KNEE SURGERY Right   . SHOULDER SURGERY Left   . VIDEO BRONCHOSCOPY WITH ENDOBRONCHIAL ULTRASOUND N/A 02/08/2020   Procedure: VIDEO BRONCHOSCOPY WITH ENDOBRONCHIAL ULTRASOUND;  Surgeon: Freddi Starr, MD;  Location: [facility];  Service: Pulmonary;  Laterality: N/A;    SOCIAL HISTORY: Social History   Socioeconomic History  . Marital status: Single    Spouse name: [name]  . Number of children: Not on file  . Years of education: Not on file  . Highest education level: Not on file  Occupational History  . Not on file  Tobacco Use  . Smoking status: Former Smoker    Packs/day: 1.00    Years: 20.00    Pack years: 20.00    Types: Cigarettes    Quit date: 06/09/2018    Years since quitting: 1.9  . Smokeless tobacco: Never Used  Vaping Use  . Vaping Use: Never used  Substance and Sexual Activity  . Alcohol use: Not Currently  . Drug use: Never  . Sexual activity: Yes  Other Topics Concern  . Not on file  Social History Narrative  . Not on file   Social Determinants of Health   Financial Resource Strain: Not on file  Food Insecurity: Not on file  Transportation Needs: Not on file   Physical Activity: Not on file  Stress: Not on file  Social Connections: Not on file  Intimate Partner Violence: Not on file    FAMILY HISTORY: Family History  Problem Relation Age of Onset  . Heart disease Mother   . Cancer Cousin   . Breast cancer Neg Hx   . Colon cancer Neg Hx   . Pancreatic cancer Neg Hx   . Prostate cancer Neg Hx     ALLERGIES:  has No Known Allergies.  MEDICATIONS:  Current Outpatient Medications  Medication Sig Dispense Refill  . amLODipine (NORVASC) 10 MG tablet Take 10 mg by mouth daily.    Marland Kitchen ibuprofen (ADVIL) 600 MG tablet Take 600 mg by mouth every 6 (six) hours as needed.    Marland Kitchen lisinopril-hydrochlorothiazide (ZESTORETIC) 20-12.5 MG tablet Take 1 tablet by mouth daily.    . prochlorperazine (COMPAZINE) 10 MG tablet Take 10 mg by mouth every 6 (six) hours as needed for nausea or vomiting.     No current facility-administered medications for this visit.    PHYSICAL EXAMINATION:  ECOG PERFORMANCE STATUS: 0 - Asymptomatic  Vitals:   05/17/20 1211  BP: (!) 153/95  Pulse: 98  Resp: 17  Temp:  97.9 F (36.6 C)  SpO2: 100%   Filed Weights   05/17/20 1211  Weight: 193 lb (87.5 kg)   GENERAL:alert, no distress and comfortable,  SKIN: skin color, texture, turgor are normal, no rashes or significant lesions.  Just a small area of folliculitis noted on the right chest which is likely related to the hair on the chest.  I do not believe this is in the area of radiation.  This is unlikely secondary to the treatment. EYES: normal, conjunctiva are pink and non-injected, sclera clear OROPHARYNX:no exudate, no erythema and lips, buccal mucosa, and tongue normal  NECK: supple, thyroid normal size, non-tender, without nodularity LYMPH:  no palpable lymphadenopathy in the cervical, axillary or inguinal LUNGS: clear to auscultation and percussion with normal breathing effort HEART: Sinus tachycardia, no murmurs, no lower extremity edema.   ABDOMEN:abdomen  soft, non-tender and normal bowel sounds Musculoskeletal:no cyanosis of digits and no clubbing  PSYCH: alert & oriented x 3 with fluent speech NEURO: no focal motor/sensory deficits . LABORATORY DATA:  I have reviewed the data as listed Lab Results  Component Value Date   WBC 2.1 (L) 05/17/2020   HGB 10.2 (L) 05/17/2020   HCT 32.1 (L) 05/17/2020   MCV 86.1 05/17/2020   PLT 87 (L) 05/17/2020     Chemistry      Component Value Date/Time   NA 141 05/17/2020 1147   K 4.0 05/17/2020 1147   CL 105 05/17/2020 1147   CO2 29 05/17/2020 1147   BUN 14 05/17/2020 1147   CREATININE 1.09 05/17/2020 1147      Component Value Date/Time   CALCIUM 9.6 05/17/2020 1147   ALKPHOS 65 05/17/2020 1147   AST 17 05/17/2020 1147   ALT 11 05/17/2020 1147   BILITOT 0.3 05/17/2020 1147     I reviewed his labs from today.  Leukopenia improved compared to last week, thrombocytopenia.  RADIOGRAPHIC STUDIES: I have personally reviewed the radiological images as listed and agreed with the findings in the report.  No results found.  All questions were answered. The patient knows to call the clinic with any problems, questions or concerns. I spent 20 minutes in the care of this patient including H and P, review of records, counseling and coordination of care.     Benay Pike, MD 05/17/2020 1:14 PM

## 2020-05-17 NOTE — Assessment & Plan Note (Signed)
Improving, thrombocytopenia is also likely from treatment No indication for intevention Will repeat labs next week and FU

## 2020-05-17 NOTE — Research (Signed)
[id number] TESTING THE ADDITION OF ATEZOLIZUMAB TO THE USUAL CHEMORADIATION TREATMENT FOR LIMITED STAGE SMALL CELL LUNG CANCER  Adam [name] arrives [place] today for labs and on-study C3D15 visit.  PROs: PROs are completed upon arrival, prior to any procedures. Questionnaire is reviewed and all questions are answered. Adam [name] reports resolution of tinnitus for 1 week, as well as resolution of mild fatigue since last visit.  Pt reports mild itching on R upper side of chest without rash starting 2 nights ago.  LABS:Mandatory study labsarecollected perstudy protocol and Adam [name] tolerates the procedure well without complaint.  VITAL SIGNS: Vital signs are collected per protocol. Adam [name] has a history of hypertension and states he took his blood pressure medications earlier this morning. His blood pressure is 153/95 prior to the MD visit.  Heart rate 99 bpm.  Current weight 193 lb (pt states he is eating and drinking regularly).  MEDICATION REVIEW:Adam [name] reviews thecurrent med list and verifies the list as correct. He has not required any additional medications, including compazine for nausea, since his last visit.  MD VISIT: Adam [name] sees Dr. Chryl Heck today: he denies any complaints.   ADVERSE EVENTS: Adam [name] has a baseline history of hypertension (grade 2), anemia (grade 1), and a minor cough (grade 1), all present prior to the pre-registration cycle of treatment (CTCAE 5.0). His cough has resolved. He reports itching on R upper chest without rash starting on 05/15/20, grade 1 (CTCAE 5.0).  Pt states he will use silvadene cream given to him by Rad-Onc for radiation management on the area to assist with pruritis, as well as hydrocortisone OTC cream as needed if itching continues after using radiation cream per MD Iruku's advisement during visit.  Pt denies any further episodes of nausea since last visit.  Today's lab results reveal a grade 2 white blood cell count decreased and grade 2 decreased neutrophil  count. He continues to have a grade 1 baseline anemia.  Lab results also show decreased platelet count grade 1.  Reviewed by MD Iruku who will monitor platelets at next visit (05/24/20) and determine then if any intervention is required at that time.  None of these values are considered clinically significant or require intervention.   AE TABLE: Non-reportable in italics.  Event Grade Onset Date End Date Status Comments Attribution to Cisplatin Attribution to Etoposide  Platelet count decreased 1 05/17/2020  Ongoing No intervention required  Unrelated  Unlikely  Possible  Probable X   Definite  Unrelated  Unlikely  Possible  Probable X   Definite  Pruritis  1 05/17/2020  Ongoing No intervention required  X  Unrelated  Unlikely  Possible  Probable  Definite  X  Unrelated  Unlikely  Possible  Probable  Definite  Decreased white blood cell (WBC) count  2 05/17/2020  Ongoing No intervention required X   Unrelated  Unlikely  Possible  Probable  Definite X   Unrelated  Unlikely  Possible  Probable  Definite  Tinnitus 1 03/29/2020 05/17/2020 Resolved No intervention required X   Unrelated  Unlikely  Possible  Probable  Definite  X   Unrelated  Unlikely  Possible  Probable  Definite   Decreased white blood cell (WBC) count 3 05/10/2020 05/17/20 Resolved No intervention required  Unrelated  Unlikely  Possible  Probable X   Definite   Unrelated  Unlikely  Possible  Probable X   Definite   Decreased white blood cell (WBC) count 1 05/03/2020 05/10/2020 Ongoing No intervention required  Unrelated  Unlikely  Possible  Probable X   Definite   Unrelated  Unlikely  Possible  Probable X   Definite   Decreased neutrophil count 2 05/09/2020  Ongoing No intervention required  Unrelated  Unlikely  Possible  Probable X   Definite   Unrelated  Unlikely  Possible  Probable X   Definite   Fatigue 1  03/22/2020 05/17/2020 Resolved Relieved by rest  Unrelated  Unlikely X    Possible  Probable  Definite   Unrelated  Unlikely X    Possible  Probable  Definite   Nausea 1 05/03/2020 05/03/2020 Resolved Treated with prochlorperazine 10mg  x1   Unrelated  Unlikely X    Possible  Probable  Definite   Unrelated  Unlikely X    Possible  Probable  Definite     PLAN: Current plan is for Adam [name] to return for his on-study C3D21 visit next Tuesday, 05/24/20.  Appointment for CT Chest with contrast scheduled for same day before seeing MD Iruku.  Pt provided with appointment information and how to prep for CT scan.  Labs and AEs will be assessed at 05/24/20 visit per study protocol.  Adam [name] '[place], RN, BSN Clinical Research Nurse I 05/17/20  2:27 PM

## 2020-05-17 NOTE — Assessment & Plan Note (Signed)
Small cell lung cancer, limited stage  LIMITED STAGE SMALL CELL LUNG CANCER (LS-SCLC): A PHASE III  RANDOMIZED STUDY OF CHEMORADIATION VERSUS CHEMORADIATION PLUS ATEZOLIZUMAB (10-June-2019)  C1D1 cis/etoposide completed on oh 2.10.22. C2D1 completed on 03/15/2020 through 03/17/2020 Cycle 3-day 1 completed on 04/05/2020 through 04/07/2020 C4 D1 completed 4/26-4/28  He is following up weekly per protocol. He is doing well, no concerning ROS, itching episode is likely from folliculitis, unrelated to treatment. PE unremarkable. No concerns. Labs next week, Imaging and FU next week. If no concern on imaging, then we will plan to FU every 3 months.

## 2020-05-24 ENCOUNTER — Other Ambulatory Visit: Payer: Self-pay

## 2020-05-24 ENCOUNTER — Ambulatory Visit (HOSPITAL_COMMUNITY)
Admission: RE | Admit: 2020-05-24 | Discharge: 2020-05-24 | Disposition: A | Discharge status: Home / Self Care | Payer: 59 | Source: Ambulatory Visit | Attending: Hematology and Oncology | Admitting: Hematology and Oncology

## 2020-05-24 ENCOUNTER — Inpatient Hospital Stay (HOSPITAL_BASED_OUTPATIENT_CLINIC_OR_DEPARTMENT_OTHER): Payer: 59 | Admitting: Hematology and Oncology

## 2020-05-24 ENCOUNTER — Inpatient Hospital Stay: Payer: 59 | Admitting: Emergency Medicine

## 2020-05-24 ENCOUNTER — Encounter: Payer: Self-pay | Admitting: Hematology and Oncology

## 2020-05-24 ENCOUNTER — Encounter (HOSPITAL_COMMUNITY): Payer: Self-pay

## 2020-05-24 ENCOUNTER — Inpatient Hospital Stay: Payer: 59

## 2020-05-24 ENCOUNTER — Telehealth: Payer: Self-pay | Admitting: Hematology and Oncology

## 2020-05-24 VITALS — BP 160/95 | HR 90 | Temp 97.7°F | Resp 18 | Ht 69.0 in | Wt 194.5 lb

## 2020-05-24 DIAGNOSIS — C3411 Malignant neoplasm of upper lobe, right bronchus or lung: Secondary | ICD-10-CM

## 2020-05-24 DIAGNOSIS — Z006 Encounter for examination for normal comparison and control in clinical research program: Secondary | ICD-10-CM

## 2020-05-24 MED ORDER — SODIUM CHLORIDE (PF) 0.9 % IJ SOLN
INTRAMUSCULAR | Status: AC
  Filled 2020-05-24: qty 50

## 2020-05-24 MED ORDER — IOHEXOL 300 MG/ML  SOLN
75.0000 mL | Freq: Once | INTRAMUSCULAR | Status: AC | PRN
  Administered 2020-05-24: 75 mL via INTRAVENOUS

## 2020-05-24 NOTE — Telephone Encounter (Signed)
Scheduled appt per 5/17 sch msg. Pt aware.

## 2020-05-24 NOTE — Research (Addendum)
TRIAL: [id number]-Limited Stage Small Cell Lung Cancer (LS-SCLC): A Phase II/III Randomized Study of Chemoradiation Versus Chemoradiation Plus Atezolizumab    Patient arrives Unaccompanied for the Cycle 3(4) Day 21 visit visit.    PROs: Per study protocol, all PROs required for this visit were completed prior to other study activities and completeness has been verified.     LABS: Mandatory and optional labs were not able to be collected per study protocol.  Pt had minimal oral hydration beforehand as he was requested to limit intake for CT Chest scan earlier same day.  Pt reported imaging team having trouble getting IV access before scan.  Pt was stuck 4 times by two phlebotomists in [place] lab, unable to draw any of scheduled labs.  MD Iruku made aware, requests that pt return in 1 week (05/31/20) to have all labs drawn, after which she will contact the patient with any abnormal results.  Pt verbalized understanding and agreement to plan.  Lab appt made and orders in place.   MEDICATION REVIEW: Patient reviews and verifies the current medication list is correct.  Requested that compazine be removed from list as patient states he only had to take it 1-2 times during treatment and no longer experiences any nausea.  Medication discontinued.  Reported changes were recorded on the medication list and marked as reviewed. All other listed medications remain the same.  VITAL SIGNS: Vital signs are collected.  MD/PROVIDER VISIT: Patient sees MD Iruku for today's visit to discuss results of CT Chest scan done earlier today and to f/u on how he is doing.   ADVERSE EVENTS: Patient Adam [name] reports no new or worsening symptoms.  Pt reports resolution of pruritis on R chest today.  Due to his labwork not being drawn today his AE's based on labs cannot be assessed/changed.  Will assess these AE's once labs are drawn in 1 week (05/31/20).  Adam [name] has a baseline history of hypertension that is managed with hypertension  medication (has been both grade 1 and 2 throughout treatment, currently grade 2 per CTCAE 5.0), states he took medication this morning after his CT scan.  No change from baseline status.   ADVERSE EVENT LOG:    Event Grade Onset Date End Date Status Comments Attribution to Cisplatin Attribution to Etoposide  Platelet count decreased 1 05/17/2020  Ongoing No intervention required  Unrelated  Unlikely  Possible  Probable X Definite  Unrelated  Unlikely  Possible  Probable X Definite  Pruritis  1 05/17/2020 05/24/20 Resolved No intervention required  XUnrelated  Unlikely  Possible  Probable  Definite  XUnrelated  Unlikely  Possible  Probable  Definite  Decreased neutrophil count 2 05/09/2020  Ongoing No intervention required  Unrelated  Unlikely  Possible  Probable X Definite  Unrelated  Unlikely  Possible  Probable X Definite  Decreased white blood cell (WBC) count  2 05/17/2020  Ongoing No intervention required    Unrelated  Unlikely  Possible  Probable X Definite    Unrelated  Unlikely  Possible  Probable X Definite    DISPOSITION: Upon completion off all study requirements, patient was escorted to exit with belongings.   The patient was thanked for their time and continued voluntary participation in this study. Patient Adam [name] has been provided direct contact information and is encouraged to contact this Nurse for any needs or questions.  Wells Guiles 'Marcus [place], RN, BSN Clinical Research Nurse I 05/25/20 10:54 AM

## 2020-05-24 NOTE — Progress Notes (Signed)
[place] FOLLOW UP NOTE  Patient Care Team: Patient, No Pcp Per (Inactive) as PCP - General (General Practice) [name], RN as Registered Nurse  CHIEF COMPLAINTS/PURPOSE OF CONSULTATION:  Limited stage small cell lung cancer, follow-up per research protocol.  ASSESSMENT & PLAN:  Small cell lung cancer, 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HCC) Small cell lung cancer, limited stage  LIMITED STAGE SMALL CELL LUNG CANCER (LS-SCLC): A PHASE III  RANDOMIZED STUDY OF CHEMORADIATION VERSUS CHEMORADIATION PLUS ATEZOLIZUMAB (10-June-2019)  C1D1 cis/etoposide completed on oh 2.10.22. C2D1 completed on 03/15/2020 through 03/17/2020 Cycle 3-day 1 completed on 04/05/2020 through 04/07/2020 C4 D1 completed 4/26-4/28  He is here for follow-up per research protocol. Since last visit, no concerning complaints.  Physical examination unremarkable.,  We were not able to draw labs today, he was stuck multiple times were likely dehydrated, he will return to clinic in about 7 days for repeat try. He did have his CT imaging today, I reviewed the imaging as well as the report and I agree with the radiologist report that there is a significant response in both the lung nodule and the mediastinal lymphadenopathy. He will repeat CT in 3 months according to the protocol. He was encouraged to contact us with any new questions or concerns in the interim.  He will otherwise return to clinic in 3 months. No concern for pneumonitis, esophagitis, hypophysitis or other side effects.  Orders Placed This Encounter  Procedures  . CBC with Differential/Platelet    Standing Status:   Standing    Number of Occurrences:   22    Standing Expiration Date:   05/24/2021  . CMP ([place] only)    Standing Status:   Future    Standing Expiration Date:   05/24/2021     HISTORY OF PRESENTING ILLNESS:  Adam [name] 65 y.o. male is here because of limited stage small cell lung cancer.  Oncology History Overview Note   He is currently on the following trial. Please refer to my original note for complete history.  LIMITED STAGE SMALL CELL LUNG CANCER (LS-SCLC): A PHASE III  RANDOMIZED STUDY OF CHEMORADIATION VERSUS CHEMORADIATION PLUS ATEZOLIZUMAB (10-June-2019)  C1 D1 02/16/2020-02/18/2020 He has consented for the trial and is enrolled to the arm without immunotherapy. C2 D1-D3 03/15/2020-03/17/2020 C3D1 completed 04/05/2020 through 04/07/2020 C4D1 delayed by a week because of neutropenia. C4D1 completed 4/26-22-05/05/2020   Primary cancer of right upper lobe of lung ([place])  02/06/2020 Initial Diagnosis   Primary cancer of right upper lobe of lung ([address])   02/10/2020 Cancer Staging   Staging form: Lung, AJCC 8th Edition - Pathologic stage from 02/10/2020: No Stage Recommended (cT1c, cN2, cM0) - Signed by Benay Pike, MD on 02/10/2020 Histopathologic type: Small cell carcinoma, NOS Stage prefix: Initial diagnosis Histologic grade (G): GX Histologic grading system: 4 grade system   Small cell lung cancer, 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[place])  02/10/2020 Initial Diagnosis   Small cell lung cancer in adult [facility])   02/16/2020 - 02/18/2020 Chemotherapy         03/15/2020 -  Chemotherapy    Patient is on Treatment Plan: LUNG SMALL CELL - LIMITED STAGE RESEARCH [id number] CISPLATIN D1 / ETOPOSIDE D1-3 ARM 1       INTERIM HISTORY  Adam [name] is here for follow-up by himself.  He has been feeling really well.  No complaints.  Energy is lower than before but it slowly getting better.  He has been spending a lot of time fishing.  No cough, chest pain, shortness of breath or hemoptysis.  No difficulty swallowing, change in bowel habits or urinary habits.  No headaches, change in balance, double vision or other neurological complaints.  They were unable to draw blood from him today, likely secondary to some dehydration since he was n.p.o. for his CT chest.  Rest of the pertinent review of systems reviewed and negative.  REVIEW OF  SYSTEMS:   Constitutional: Denies fevers, chills or abnormal night sweats Eyes: Denies blurriness of vision, double vision or watery eyes Ears, nose, mouth, throat, and face: Denies mucositis or sore throat Respiratory: Denies cough, dyspnea or wheezes Cardiovascular: Denies palpitation, chest discomfort or lower extremity swelling Gastrointestinal:  Denies nausea, heartburn or change in bowel habits Skin: Denies abnormal skin rashes Lymphatics: Denies new lymphadenopathy or easy bruising Neurological:Denies numbness, tingling or new weaknesses Behavioral/Psych: Mood is stable, no new changes  All other systems were reviewed with the patient and are negative.   MEDICAL HISTORY:  Past Medical History:  Diagnosis Date  . Hypertension   . Lung cancer ([place])    small cell RUL    SURGICAL HISTORY: Past Surgical History:  Procedure Laterality Date  . BRONCHIAL BRUSHINGS  02/08/2020   Procedure: BRONCHIAL BRUSHINGS;  Surgeon: Freddi Starr, MD;  Location: [facility];  Service: Pulmonary;;  . BRONCHIAL NEEDLE ASPIRATION BIOPSY  02/08/2020   Procedure: BRONCHIAL NEEDLE ASPIRATION BIOPSIES;  Surgeon: Freddi Starr, MD;  Location: [facility] ENDOSCOPY;  Service: Pulmonary;;  . KNEE SURGERY Right   . SHOULDER SURGERY Left   . VIDEO BRONCHOSCOPY WITH ENDOBRONCHIAL ULTRASOUND N/A 02/08/2020   Procedure: VIDEO BRONCHOSCOPY WITH ENDOBRONCHIAL ULTRASOUND;  Surgeon: Freddi Starr, MD;  Location: [facility];  Service: Pulmonary;  Laterality: N/A;    SOCIAL HISTORY: Social History   Socioeconomic History  . Marital status: Divorced    Spouse name: [name]  . Number of children: Not on file  . Years of education: Not on file  . Highest education level: Not on file  Occupational History  . Not on file  Tobacco Use  . Smoking status: Former Smoker    Packs/day: 1.00    Years: 20.00    Pack years: 20.00    Types: Cigarettes    Quit date: 06/09/2018    Years since quitting: 1.9  .  Smokeless tobacco: Never Used  Vaping Use  . Vaping Use: Never used  Substance and Sexual Activity  . Alcohol use: Not Currently  . Drug use: Never  . Sexual activity: Yes  Other Topics Concern  . Not on file  Social History Narrative  . Not on file   Social Determinants of Health   Financial Resource Strain: Not on file  Food Insecurity: Not on file  Transportation Needs: Not on file  Physical Activity: Not on file  Stress: Not on file  Social Connections: Not on file  Intimate Partner Violence: Not on file    FAMILY HISTORY: Family History  Problem Relation Age of Onset  . Heart disease Mother   . Cancer Cousin   . Breast cancer Neg Hx   . Colon cancer Neg Hx   . Pancreatic cancer Neg Hx   . Prostate cancer Neg Hx     ALLERGIES:  has No Known Allergies.  MEDICATIONS:  Current Outpatient Medications  Medication Sig Dispense Refill  . amLODipine (NORVASC) 10 MG tablet Take 10 mg by mouth daily.    Marland Kitchen ibuprofen (ADVIL) 600 MG tablet Take 600 mg by mouth  every 6 (six) hours as needed.    Marland Kitchen lisinopril-hydrochlorothiazide (ZESTORETIC) 20-12.5 MG tablet Take 1 tablet by mouth daily.     No current facility-administered medications for this visit.   Facility-Administered Medications Ordered in Other Visits  Medication Dose Route Frequency Provider Last Rate Last Admin  . sodium chloride (PF) 0.9 % injection             PHYSICAL EXAMINATION: ECOG PERFORMANCE STATUS: 0 - Asymptomatic  Vitals:   05/24/20 1243  BP: (!) 160/95  Pulse: 90  Resp: 18  Temp: 97.7 F (36.5 C)  SpO2: 100%   Filed Weights   05/24/20 1243  Weight: 194 lb 8 oz (88.2 kg)    GENERAL:alert, no distress and comfortable SKIN: skin color, texture, turgor are normal, no rashes or significant lesions EYES: normal, conjunctiva are pink and non-injected, sclera clear OROPHARYNX:no exudate, no erythema and lips, buccal mucosa, and tongue normal  NECK: supple, thyroid normal size, non-tender,  without nodularity LYMPH:  no palpable lymphadenopathy in the cervical, axillary or inguinal LUNGS: clear to auscultation and percussion with normal breathing effort HEART: regular rate & rhythm and no murmurs and no lower extremity edema ABDOMEN:abdomen soft, non-tender and normal bowel sounds Musculoskeletal:no cyanosis of digits and no clubbing  PSYCH: alert & oriented x 3 with fluent speech NEURO: no focal motor/sensory deficits  LABORATORY DATA:  I have reviewed the data as listed Lab Results  Component Value Date   WBC 2.1 (L) 05/17/2020   HGB 10.2 (L) 05/17/2020   HCT 32.1 (L) 05/17/2020   MCV 86.1 05/17/2020   PLT 87 (L) 05/17/2020     Chemistry      Component Value Date/Time   NA 141 05/17/2020 1147   K 4.0 05/17/2020 1147   CL 105 05/17/2020 1147   CO2 29 05/17/2020 1147   BUN 14 05/17/2020 1147   CREATININE 1.09 05/17/2020 1147      Component Value Date/Time   CALCIUM 9.6 05/17/2020 1147   ALKPHOS 65 05/17/2020 1147   AST 17 05/17/2020 1147   ALT 11 05/17/2020 1147   BILITOT 0.3 05/17/2020 1147       RADIOGRAPHIC STUDIES: I have personally reviewed the radiological images as listed and agreed with the findings in the report. CT Chest W Contrast  Result Date: 05/24/2020 CLINICAL DATA:  Small-cell lung cancer EXAM: CT CHEST WITH CONTRAST TECHNIQUE: Multidetector CT imaging of the chest was performed during intravenous contrast administration. CONTRAST:  28mL OMNIPAQUE IOHEXOL 300 MG/ML  SOLN COMPARISON:  02/06/2020 FINDINGS: Cardiovascular: Aortic atherosclerosis. Normal heart size. Three-vessel coronary artery calcification. No pericardial effusion. Mediastinum/Nodes: Significant interval reduction in size of a bulky pretracheal and right hilar soft tissue and or lymph node conglomerate, greatest axial dimension of pretracheal nodes and soft tissue approximately 1.6 x 1.4 cm, previously 3.7 x 3.5 cm when measured similarly (series 2, image 52). Overall dimension  including right hilar soft tissue mass approximately 5.4 x 2.5 cm, previously 8.9 x 7.4 cm when measured similarly (series 2, image 56). Thyroid gland, trachea, and esophagus demonstrate no significant findings. Lungs/Pleura: Moderate centrilobular and paraseptal emphysema. Interval reduction in size of a spiculated right apical nodule measuring 1.7 x 1.6 cm, previously 2.9 x 2.6 cm (series 7, image 22). No pleural effusion or pneumothorax. Upper Abdomen: No acute abnormality.  Normal adrenal glands. Musculoskeletal: No chest wall mass or suspicious bone lesions identified. IMPRESSION: 1. Significant interval reduction in size of a bulky pretracheal and right hilar soft tissue and  or lymph node conglomerate. 2. Interval reduction in size of a spiculated right apical nodule. 3. Findings are consistent with treatment response. 4. Normal adrenal glands per ordering request. 5. Emphysema. 6. Coronary artery disease. Aortic Atherosclerosis (ICD10-I70.0) and Emphysema (ICD10-J43.9). Electronically Signed   By: Eddie Candle M.D.   On: 05/24/2020 12:06   I reviewed his images personally and I do agree that he had significant response.  He will have another CT in 3 months per research protocol.  All his questions were answered to the best of my knowledge.    Benay Pike, MD 05/24/2020 2:02 PM

## 2020-05-24 NOTE — Assessment & Plan Note (Signed)
Small cell lung cancer, limited stage  LIMITED STAGE SMALL CELL LUNG CANCER (LS-SCLC): A PHASE III  RANDOMIZED STUDY OF CHEMORADIATION VERSUS CHEMORADIATION PLUS ATEZOLIZUMAB (10-June-2019)  C1D1 cis/etoposide completed on oh 2.10.22. C2D1 completed on 03/15/2020 through 03/17/2020 Cycle 3-day 1 completed on 04/05/2020 through 04/07/2020 C4 D1 completed 4/26-4/28  He is here for follow-up per research protocol. Since last visit, no concerning complaints.  Physical examination unremarkable.,  We were not able to draw labs today, he was stuck multiple times were likely dehydrated, he will return to clinic in about 7 days for repeat try. He did have his CT imaging today, I reviewed the imaging as well as the report and I agree with the radiologist report that there is a significant response in both the lung nodule and the mediastinal lymphadenopathy. He will repeat CT in 3 months according to the protocol. He was encouraged to contact us with any new questions or concerns in the interim.  He will otherwise return to clinic in 3 months. No concern for pneumonitis, esophagitis, hypophysitis or other side effects.

## 2020-05-25 ENCOUNTER — Ambulatory Visit: Payer: 59 | Admitting: Internal Medicine

## 2020-05-26 NOTE — Progress Notes (Addendum)
Patient is aware that this is a phone visit . Patients meaningful use is  complete. Patient denies any sob,difficulty with swallowing, pain, cough or nausea  vomiting.Patient reports a good appitie and mild fatigue.

## 2020-05-31 ENCOUNTER — Other Ambulatory Visit: Payer: 59

## 2020-05-31 ENCOUNTER — Encounter: Payer: Self-pay | Admitting: Emergency Medicine

## 2020-05-31 ENCOUNTER — Inpatient Hospital Stay: Payer: 59

## 2020-05-31 ENCOUNTER — Ambulatory Visit: Payer: 59 | Admitting: Hematology and Oncology

## 2020-05-31 ENCOUNTER — Telehealth: Payer: Self-pay | Admitting: Emergency Medicine

## 2020-05-31 ENCOUNTER — Other Ambulatory Visit: Payer: Self-pay

## 2020-05-31 DIAGNOSIS — C3411 Malignant neoplasm of upper lobe, right bronchus or lung: Secondary | ICD-10-CM

## 2020-05-31 LAB — TSH: TSH: 1.64 u[IU]/mL (ref 0.320–4.118)

## 2020-05-31 LAB — CBC WITH DIFFERENTIAL (CANCER CENTER ONLY)
Abs Immature Granulocytes: 0.07 10*3/uL (ref 0.00–0.07)
Basophils Absolute: 0.1 10*3/uL (ref 0.0–0.1)
Basophils Relative: 1 %
Eosinophils Absolute: 0.1 10*3/uL (ref 0.0–0.5)
Eosinophils Relative: 1 %
HCT: 35.5 % — ABNORMAL LOW (ref 39.0–52.0)
Hemoglobin: 11.2 g/dL — ABNORMAL LOW (ref 13.0–17.0)
Immature Granulocytes: 1 %
Lymphocytes Relative: 12 %
Lymphs Abs: 0.8 10*3/uL (ref 0.7–4.0)
MCH: 27.4 pg (ref 26.0–34.0)
MCHC: 31.5 g/dL (ref 30.0–36.0)
MCV: 86.8 fL (ref 80.0–100.0)
Monocytes Absolute: 1.6 10*3/uL — ABNORMAL HIGH (ref 0.1–1.0)
Monocytes Relative: 25 %
Neutro Abs: 3.8 10*3/uL (ref 1.7–7.7)
Neutrophils Relative %: 60 %
Platelet Count: 385 10*3/uL (ref 150–400)
RBC: 4.09 MIL/uL — ABNORMAL LOW (ref 4.22–5.81)
RDW: 18.9 % — ABNORMAL HIGH (ref 11.5–15.5)
WBC Count: 6.3 10*3/uL (ref 4.0–10.5)
nRBC: 0 % (ref 0.0–0.2)

## 2020-05-31 LAB — CMP (CANCER CENTER ONLY)
ALT: <6 U/L (ref 0–44)
AST: 10 U/L — ABNORMAL LOW (ref 15–41)
Albumin: 3.5 g/dL (ref 3.5–5.0)
Alkaline Phosphatase: 66 U/L (ref 38–126)
Anion gap: 11 (ref 5–15)
BUN: 12 mg/dL (ref 8–23)
CO2: 27 mmol/L (ref 22–32)
Calcium: 10.2 mg/dL (ref 8.9–10.3)
Chloride: 104 mmol/L (ref 98–111)
Creatinine: 1 mg/dL (ref 0.61–1.24)
GFR, Estimated: >60 mL/min (ref >60)
Glucose, Bld: 104 mg/dL — ABNORMAL HIGH (ref 70–99)
Potassium: 4.4 mmol/L (ref 3.5–5.1)
Sodium: 142 mmol/L (ref 135–145)
Total Bilirubin: 0.4 mg/dL (ref 0.3–1.2)
Total Protein: 7.5 g/dL (ref 6.5–8.1)

## 2020-05-31 NOTE — Telephone Encounter (Signed)
Title: NRG-LU005-Limited Stage Small Cell Lung Cancer (LS-SCLC): A Phase II/III Randomized Study of Chemoradiation Versus Chemoradiation Plus Atezolizumab  Contacted patient regarding lab results today.  Ok per MD Iruku to let pt know that results are stable and that there are no issues or new recommendations at this time.  Mildly elevated glucose level today, pt reports eating shortly before lab draw.  Pt denies any questions or concerns at this time.  Wells Guiles '[place], RN, BSN Clinical Research Nurse I 05/31/20 1:19 PM

## 2020-05-31 NOTE — Research (Signed)
Trial: [id number]-LU005-Limited Stage Small Cell Lung Cancer (LS-SCLC): A Phase II/III Randomized Study of Chemoradiation Versus Chemoradiation Plus Atezolizumab  Lab Re-Draw (05/31/2020)    LABS: Mandatory and optional labs are collected per consent and study protocol.  Patient had labs drawn today (05/31/2020) that were originally intended to be drawn for END OF CRT (05/24/2020) but labs could not be drawn d/t peripheral access issues.  Pt was asked to return today to have labs redrawn.  ADVERSE EVENT LOG: Patient has ongoing pre-treatment baseline of grade 1 anemia and grade 1 hypertension (CTCAE v. 5.0).  VS not assessed today.  Grade 1 anemia ongoing and unchanged from baseline.  Pt had grade 1 hyperglycemia today (glucose 104) which MR Iruku has ruled as unrelated as pt reports eating shortly before lab draw this morning (see telephone note).  Patient's ANC, platelets, and WBC counts have all improved and are no longer outside of normal range.  MD Iruku reviewed all labs today.  No intervention required at this time.   Event Grade Onset Date End Date Status Comments Attribution to Cisplatin Attribution to Etoposide  Platelet count decreased 1 05/17/2020  05/31/2020 Resolved No intervention required  Unrelated  Unlikely  Possible  Probable X   Definite  Unrelated  Unlikely  Possible  Probable X   Definite  Decreased neutrophil count 2 05/09/2020  05/31/2020 Resolved No intervention required  Unrelated  Unlikely  Possible  Probable X   Definite  Unrelated  Unlikely  Possible  Probable X   Definite  Decreased white blood cell (WBC) count   2 05/17/2020  05/31/2020 Resolved No intervention required . Unrelated  Unlikely  Possible  Probable X Definite . Unrelated  Unlikely  Possible  Probable X Definite  Hyperglycemia 1 05/31/2020  Ongoing No intervention required X Unrelated . Unlikely . Possible . Probably . Definite X  Unrelated . Unlikely . Possible . Probably . Definite    During phone call providing lab results to patient he was thanked for their time and continued voluntary participation in this study. Patient Adam [name] has been provided direct contact information and is encouraged to contact this Nurse for any needs or questions.  Wells Guiles '[place], RN, BSN Clinical Research Nurse I 05/31/20 1:34 PM

## 2020-06-01 ENCOUNTER — Ambulatory Visit
Admission: RE | Admit: 2020-06-01 | Discharge: 2020-06-01 | Disposition: A | Discharge status: Home / Self Care | Payer: 59 | Source: Ambulatory Visit | Attending: Urology | Admitting: Urology

## 2020-06-01 ENCOUNTER — Encounter: Payer: Self-pay | Admitting: Hematology and Oncology

## 2020-06-01 DIAGNOSIS — C3411 Malignant neoplasm of upper lobe, right bronchus or lung: Secondary | ICD-10-CM

## 2020-06-01 DIAGNOSIS — C3490 Malignant neoplasm of unspecified part of unspecified bronchus or lung: Secondary | ICD-10-CM

## 2020-06-01 NOTE — Progress Notes (Signed)
  Radiation Oncology         [phone] ________________________________  Name: Adam [name] MRN: [id number]  Date: 04/28/2020  DOB: 1955/02/06   End of Treatment Note  Diagnosis:   65 yo gentleman with hemoptysis from T1 N2 M0 small cell carcinoma of the right upper lung, Limited Stage, Stage IIIA     Indication for treatment:  Curative, Chemo-Radiotherapy       Radiation treatment dates:   03/15/20 - 04/28/20  Site/dose:   The primary tumor and involved mediastinal adenopathy were treated to 66 Gy in 33 fractions of 2 Gy.  Beams/energy:   A five field 3D conformal treatment arrangement was used delivering 6 and 10 MV photons.  Daily image-guidance CT was used to align the treatment with the targeted volume  Narrative: The patient tolerated radiation treatment relatively well with only mild fatigue. He denied any productive cough, hemoptysis, esophagitis or dysphagia.  Plan: The patient has completed radiation treatment. The patient will return to radiation oncology clinic for routine followup in one month. I advised him to call or return sooner if he has any questions or concerns related to his recovery or treatment.  ________________________________  Sheral Apley. Tammi Klippel, M.D.

## 2020-06-01 NOTE — Progress Notes (Signed)
Radiation Oncology         [phone]) [phone] ________________________________  Name: Adam [name] MRN: [id number]  Date: 06/01/2020  DOB: 1955-01-12  Post Treatment Note  CC: Patient, No Pcp Per (Inactive)  No ref. provider found  Diagnosis:   65 yo gentleman with hemoptysis from T1 N2 M0 small cell carcinoma of the right upper lung, Limited Stage, Stage IIIA  Interval Since Last Radiation:  5 weeks  03/15/20 - 04/28/20:  The primary tumor and involved mediastinal adenopathy were treated to 66 Gy in 33 fractions of 2 Gy.  Narrative:  I spoke with the patient to conduct his routine scheduled 1 month follow up visit via telephone to spare the patient unnecessary potential exposure in the healthcare setting during the current COVID-19 pandemic.  The patient was notified in advance and gave permission to proceed with this visit format.  He tolerated radiation treatment relatively well with only mild fatigue. He denied any productive cough, hemoptysis, esophagitis or dysphagia.                              On review of systems, the patient states that he has continued to do very well in general.  He remains without complaints and specifically denies dysphagia, productive cough, shortness of breath, chest pain, hemoptysis, fever, chills or night sweats.  He reports a healthy appetite and is maintaining his weight.  He has developed some mild dysgeusia but reports that this has not impacted his nutrition.  He denies any headaches, nausea, vomiting, changes in visual or auditory acuity, difficulty with speech, focal weakness or tremors.  He reports residual generalized weakness and fatigue that is gradually improving and overall, he is quite pleased with his progress to date.  He completed the planned 4 cycles of systemic therapy on 05/05/2020 and had a recent follow-up CT chest on 05/24/2020 which showed an excellent treatment response with decreased size of the lung nodule and mediastinal lymphadenopathy.  As  per research protocol, he will have a repeat CT chest in approximately 3 months and will follow-up with Dr. Chryl [name] shortly thereafter to review results and recommendations.  ALLERGIES:  has No Known Allergies.  Meds: Current Outpatient Medications  Medication Sig Dispense Refill  . amLODipine (NORVASC) 10 MG tablet Take 10 mg by mouth daily.    Marland Kitchen ibuprofen (ADVIL) 600 MG tablet Take 600 mg by mouth every 6 (six) hours as needed.    Marland Kitchen lisinopril-hydrochlorothiazide (ZESTORETIC) 20-12.5 MG tablet Take 1 tablet by mouth daily.     No current facility-administered medications for this encounter.    Physical Findings:  vitals were not taken for this visit.   /Unable to assess due to telephone follow-up visit format.   Lab Findings: Lab Results  Component Value Date   WBC 6.3 05/31/2020   HGB 11.2 (L) 05/31/2020   HCT 35.5 (L) 05/31/2020   MCV 86.8 05/31/2020   PLT 385 05/31/2020     Radiographic Findings: CT Chest W Contrast  Result Date: 05/24/2020 CLINICAL DATA:  Small-cell lung cancer EXAM: CT CHEST WITH CONTRAST TECHNIQUE: Multidetector CT imaging of the chest was performed during intravenous contrast administration. CONTRAST:  105mL OMNIPAQUE IOHEXOL 300 MG/ML  SOLN COMPARISON:  02/06/2020 FINDINGS: Cardiovascular: Aortic atherosclerosis. Normal heart size. Three-vessel coronary artery calcification. No pericardial effusion. Mediastinum/Nodes: Significant interval reduction in size of a bulky pretracheal and right hilar soft tissue and or lymph node conglomerate, greatest axial dimension of  pretracheal nodes and soft tissue approximately 1.6 x 1.4 cm, previously 3.7 x 3.5 cm when measured similarly (series 2, image 52). Overall dimension including right hilar soft tissue mass approximately 5.4 x 2.5 cm, previously 8.9 x 7.4 cm when measured similarly (series 2, image 56). Thyroid gland, trachea, and esophagus demonstrate no significant findings. Lungs/Pleura: Moderate centrilobular and  paraseptal emphysema. Interval reduction in size of a spiculated right apical nodule measuring 1.7 x 1.6 cm, previously 2.9 x 2.6 cm (series 7, image 22). No pleural effusion or pneumothorax. Upper Abdomen: No acute abnormality.  Normal adrenal glands. Musculoskeletal: No chest wall mass or suspicious bone lesions identified. IMPRESSION: 1. Significant interval reduction in size of a bulky pretracheal and right hilar soft tissue and or lymph node conglomerate. 2. Interval reduction in size of a spiculated right apical nodule. 3. Findings are consistent with treatment response. 4. Normal adrenal glands per ordering request. 5. Emphysema. 6. Coronary artery disease. Aortic Atherosclerosis (ICD10-I70.0) and Emphysema (ICD10-J43.9). Electronically Signed   By: Eddie Candle M.D.   On: 05/24/2020 12:06    Impression/Plan: 1. 65 yo gentleman with hemoptysis from T1 N2 M0 small cell carcinoma of the right upper lung, Limited Stage, Stage IIIA. He appears to have recovered well from the effects of his recent chest radiation concurrent with chemotherapy and is currently without complaints.  His most recent chest CT indicates an excellent response to treatment and he continues on research protocol with plans for a repeat CT chest in approximately 3 months.  He will continue in routine follow-up under the care and direction of Dr. Chryl [name] for continued management of his systemic disease.  2. Prophylactic cranial irradiation- We discussed the increased risk of potential brain metastasis in the setting of small cell lung cancer and the recommendation for PCI to help reduce the risk from approximately 60% down to around 15% as well as an improvement in overall survival.  We reviewed the anticipated 2-week course of daily treatment as well as the acute and late side effects to be expected.  Prior to proceeding with treatment, we would need to obtain a baseline MRI brain scan and pending this scan is negative, we would proceed  with scheduling CT simulation/treatment planning in anticipation of beginning a 10 fraction course of daily radiotherapy for total dose of 25 Gy.  If there are any lesions present on the scan, we would still proceed with CT simulation/treatment planning in anticipation of beginning a 10 fraction course of daily radiotherapy for a total dose of 30 Gy.  The patient was encouraged to ask questions that were answered to his stated satisfaction and he is in agreement to proceed. He prefers to wait an additional 2-3 weeks prior to proceeding with PCI to allow some additional time to recover from his recent chemoradiation. We will make arrangements for MRI brain in 2 weeks in anticipation of proceeding with PCI shortly thereafter. He appears to have a good understanding of his disease and is comfortable and in agreement with the stated plan.     Nicholos Johns, PA-C

## 2020-06-03 ENCOUNTER — Telehealth: Payer: Self-pay | Admitting: *Deleted

## 2020-06-03 ENCOUNTER — Other Ambulatory Visit: Payer: Self-pay | Admitting: Hematology and Oncology

## 2020-06-03 DIAGNOSIS — C3411 Malignant neoplasm of upper lobe, right bronchus or lung: Secondary | ICD-10-CM

## 2020-06-03 NOTE — Telephone Encounter (Signed)
CALLED PATIENT TO INFORM OF MRI FOR 06-15-20- ARRIVAL TIME- 9:30 AM @ [facility], NO RESTRICTIONS TO TEST, AND SIM APPT. FOR 06-17-20 - ARRIVAL TIME- 2:45 PM @ [place], SPOKE WITH PATIENT AND HE IS AWARE OF THESE APPTS.

## 2020-06-09 ENCOUNTER — Other Ambulatory Visit: Payer: Self-pay | Admitting: Hematology and Oncology

## 2020-06-09 DIAGNOSIS — M899 Disorder of bone, unspecified: Secondary | ICD-10-CM

## 2020-06-15 ENCOUNTER — Other Ambulatory Visit: Payer: Self-pay

## 2020-06-15 ENCOUNTER — Ambulatory Visit (HOSPITAL_COMMUNITY)
Admission: RE | Admit: 2020-06-15 | Discharge: 2020-06-15 | Disposition: A | Discharge status: Home / Self Care | Payer: 59 | Source: Ambulatory Visit | Attending: Urology | Admitting: Urology

## 2020-06-15 DIAGNOSIS — C3490 Malignant neoplasm of unspecified part of unspecified bronchus or lung: Secondary | ICD-10-CM | POA: Diagnosis present

## 2020-06-15 MED ORDER — GADOBUTROL 1 MMOL/ML IV SOLN
9.0000 mL | Freq: Once | INTRAVENOUS | Status: AC | PRN
  Administered 2020-06-15: 9 mL via INTRAVENOUS

## 2020-06-16 ENCOUNTER — Telehealth: Payer: Self-pay | Admitting: *Deleted

## 2020-06-16 ENCOUNTER — Telehealth: Payer: Self-pay | Admitting: Radiation Oncology

## 2020-06-16 NOTE — Telephone Encounter (Signed)
I called him and told him that his MRI of brain yesterday shows no brain metastases.  He was relieved to hear.  We will proceed with CT simulation tomorrow for prophylactic cranial irradiation, as planned with curative intent for small cell lung cancer.

## 2020-06-16 NOTE — Telephone Encounter (Signed)
CALLED PATIENT TO REMIND OF SIM APPT. FOR 06-17-20- ARRIVAL TIME- 2:45 PM @ [facility], LVM FOR A RETURN CALL

## 2020-06-17 ENCOUNTER — Other Ambulatory Visit: Payer: Self-pay

## 2020-06-17 ENCOUNTER — Ambulatory Visit
Admission: RE | Admit: 2020-06-17 | Discharge: 2020-06-17 | Disposition: A | Discharge status: Home / Self Care | Payer: 59 | Source: Ambulatory Visit | Attending: Radiation Oncology | Admitting: Radiation Oncology

## 2020-06-17 DIAGNOSIS — Z298 Encounter for other specified prophylactic measures: Secondary | ICD-10-CM | POA: Insufficient documentation

## 2020-06-17 DIAGNOSIS — Z510 Encounter for antineoplastic radiation therapy: Secondary | ICD-10-CM | POA: Diagnosis present

## 2020-06-17 DIAGNOSIS — C3411 Malignant neoplasm of upper lobe, right bronchus or lung: Secondary | ICD-10-CM | POA: Diagnosis not present

## 2020-06-17 NOTE — Progress Notes (Signed)
  Radiation Oncology         [phone] ________________________________  Name: Adam [name] MRN: [id number]  Date: 06/17/2020  DOB: [birth year]  SIMULATION AND TREATMENT PLANNING NOTE    ICD-10-CM   1. 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  Identity was confirmed.  All relevant records and images related to the planned course of therapy were reviewed.  The patient freely provided informed written consent to proceed with treatment after reviewing the details related to the planned course of therapy. The consent form was witnessed and verified by the simulation staff.  Then, the patient was set-up in a stable reproducible  supine position for radiation therapy.  CT images were obtained.  Surface markings were placed.  The CT images were loaded into the planning software.  Then the target and avoidance structures were contoured.  Treatment planning then occurred.  The radiation prescription was entered and confirmed.  Then, I designed and supervised the construction of a total of 3 medically necessary complex treatment device including a custom made thermoplastic mask used for immobilization, and two MLC collimator apertures for radiotherapy from the right and left side, with independent collimation for each to account for beam divergence.  I have requested : Isodose Plan.    PLAN:  The whole brain will be treated to 25 Gy in 10 fractions prophylactic cranial irradiation (PCI).  ________________________________  Sheral Apley Tammi Klippel, M.D.

## 2020-06-23 DIAGNOSIS — Z510 Encounter for antineoplastic radiation therapy: Secondary | ICD-10-CM | POA: Diagnosis not present

## 2020-06-27 ENCOUNTER — Other Ambulatory Visit: Payer: Self-pay

## 2020-06-27 ENCOUNTER — Ambulatory Visit
Admission: RE | Admit: 2020-06-27 | Discharge: 2020-06-27 | Disposition: A | Discharge status: Home / Self Care | Payer: 59 | Source: Ambulatory Visit | Attending: Radiation Oncology | Admitting: Radiation Oncology

## 2020-06-27 DIAGNOSIS — Z510 Encounter for antineoplastic radiation therapy: Secondary | ICD-10-CM | POA: Diagnosis not present

## 2020-06-28 ENCOUNTER — Ambulatory Visit
Admission: RE | Admit: 2020-06-28 | Discharge: 2020-06-28 | Disposition: A | Discharge status: Home / Self Care | Payer: 59 | Source: Ambulatory Visit | Attending: Radiation Oncology | Admitting: Radiation Oncology

## 2020-06-28 DIAGNOSIS — Z510 Encounter for antineoplastic radiation therapy: Secondary | ICD-10-CM | POA: Diagnosis not present

## 2020-06-29 ENCOUNTER — Ambulatory Visit
Admission: RE | Admit: 2020-06-29 | Discharge: 2020-06-29 | Disposition: A | Discharge status: Home / Self Care | Payer: 59 | Source: Ambulatory Visit | Attending: Radiation Oncology | Admitting: Radiation Oncology

## 2020-06-29 ENCOUNTER — Other Ambulatory Visit: Payer: Self-pay

## 2020-06-29 DIAGNOSIS — Z510 Encounter for antineoplastic radiation therapy: Secondary | ICD-10-CM | POA: Diagnosis not present

## 2020-06-30 ENCOUNTER — Ambulatory Visit
Admission: RE | Admit: 2020-06-30 | Discharge: 2020-06-30 | Disposition: A | Discharge status: Home / Self Care | Payer: 59 | Source: Ambulatory Visit | Attending: Radiation Oncology | Admitting: Radiation Oncology

## 2020-06-30 DIAGNOSIS — Z510 Encounter for antineoplastic radiation therapy: Secondary | ICD-10-CM | POA: Diagnosis not present

## 2020-07-01 ENCOUNTER — Other Ambulatory Visit: Payer: Self-pay

## 2020-07-01 ENCOUNTER — Ambulatory Visit
Admission: RE | Admit: 2020-07-01 | Discharge: 2020-07-01 | Disposition: A | Discharge status: Home / Self Care | Payer: 59 | Source: Ambulatory Visit | Attending: Radiation Oncology | Admitting: Radiation Oncology

## 2020-07-01 DIAGNOSIS — Z510 Encounter for antineoplastic radiation therapy: Secondary | ICD-10-CM | POA: Diagnosis not present

## 2020-07-04 ENCOUNTER — Ambulatory Visit
Admission: RE | Admit: 2020-07-04 | Discharge: 2020-07-04 | Disposition: A | Discharge status: Home / Self Care | Payer: 59 | Source: Ambulatory Visit | Attending: Radiation Oncology | Admitting: Radiation Oncology

## 2020-07-04 ENCOUNTER — Other Ambulatory Visit: Payer: Self-pay

## 2020-07-04 DIAGNOSIS — Z510 Encounter for antineoplastic radiation therapy: Secondary | ICD-10-CM | POA: Diagnosis not present

## 2020-07-05 ENCOUNTER — Ambulatory Visit
Admission: RE | Admit: 2020-07-05 | Discharge: 2020-07-05 | Disposition: A | Discharge status: Home / Self Care | Payer: 59 | Source: Ambulatory Visit | Attending: Radiation Oncology | Admitting: Radiation Oncology

## 2020-07-05 ENCOUNTER — Other Ambulatory Visit: Payer: Self-pay

## 2020-07-05 DIAGNOSIS — Z510 Encounter for antineoplastic radiation therapy: Secondary | ICD-10-CM | POA: Diagnosis not present

## 2020-07-06 ENCOUNTER — Ambulatory Visit
Admission: RE | Admit: 2020-07-06 | Discharge: 2020-07-06 | Disposition: A | Discharge status: Home / Self Care | Payer: 59 | Source: Ambulatory Visit | Attending: Radiation Oncology | Admitting: Radiation Oncology

## 2020-07-06 DIAGNOSIS — Z510 Encounter for antineoplastic radiation therapy: Secondary | ICD-10-CM | POA: Diagnosis not present

## 2020-07-07 ENCOUNTER — Ambulatory Visit
Admission: RE | Admit: 2020-07-07 | Discharge: 2020-07-07 | Disposition: A | Discharge status: Home / Self Care | Payer: 59 | Source: Ambulatory Visit | Attending: Radiation Oncology | Admitting: Radiation Oncology

## 2020-07-07 ENCOUNTER — Other Ambulatory Visit: Payer: Self-pay

## 2020-07-07 ENCOUNTER — Encounter: Payer: Self-pay | Admitting: Hematology and Oncology

## 2020-07-07 DIAGNOSIS — Z510 Encounter for antineoplastic radiation therapy: Secondary | ICD-10-CM | POA: Diagnosis not present

## 2020-07-07 NOTE — Progress Notes (Signed)
Patient called regarding applying for [employer] grant previously discussed with him before.  Advised what is needed to apply and gave him contact information for  Ailene Ravel as he is currently in daily Radiation as well.  Staff message sent to Meredith/Adrienne.

## 2020-07-08 ENCOUNTER — Ambulatory Visit
Admission: RE | Admit: 2020-07-08 | Discharge: 2020-07-08 | Disposition: A | Discharge status: Home / Self Care | Payer: 59 | Source: Ambulatory Visit | Attending: Radiation Oncology | Admitting: Radiation Oncology

## 2020-07-08 ENCOUNTER — Encounter: Payer: Self-pay | Admitting: Urology

## 2020-07-08 DIAGNOSIS — C3411 Malignant neoplasm of upper lobe, right bronchus or lung: Secondary | ICD-10-CM

## 2020-07-08 DIAGNOSIS — Z298 Encounter for other specified prophylactic measures: Secondary | ICD-10-CM | POA: Insufficient documentation

## 2020-07-08 DIAGNOSIS — Z510 Encounter for antineoplastic radiation therapy: Secondary | ICD-10-CM | POA: Insufficient documentation

## 2020-07-18 ENCOUNTER — Telehealth: Payer: Self-pay

## 2020-07-18 NOTE — Telephone Encounter (Signed)
Called and spoke with patient about upcoming MRI and CT exams. Patient verbalized understanding of appts and instructions.

## 2020-07-21 ENCOUNTER — Telehealth: Payer: Self-pay | Admitting: Emergency Medicine

## 2020-07-21 NOTE — Telephone Encounter (Signed)
NRG-LU005-Limited Stage Small Cell Lung Cancer (LS-SCLC): A Phase II/IIIRandomized Study of Chemoradiation Versus Chemoradiation Plus Atezolizumab  Pt called requesting further information regarding MRI Femur scheduled for 07/22/20.  Pt states he was contacted today and asked to pay $500 upfront for scan, wanted to know if the study would be paying for it.  The MRI was ordered by MD Iruku for f/u on an area of concern found on R femur during CT Abd/Pelv that was re-read from 02/07/20 and addended on 06/02/20.  This scan is not part of protocol and would not be funded by the study.  MD Iruku states she spoke with radiologist regarding his updated findings, both state they are not very concerned that this (R femur spot) is malignant and/or related to the pt's cancer.  Patient states if the study will not pay for his imaging, and that if Dr. Chryl Heck feels that he is safe to wait and f/u on this scan finding after he sees her in August for CT Chest/follow up appt, then he would like to cancel this scan.  Pt denies any new pain or symptoms in that area.  MD Iruku is ok with canceling scan for now.  Pt states he will f/u on this after his August visit with MD Iruku.  Contacted MRI to cancel scan/appt, pt aware.  Pt aware to contact Research/MD Iruku as needed with any questions/concerns before August scan/appt.  Wells Guiles '[place], RN, BSN Clinical Research Nurse I 07/21/20 12:43 PM

## 2020-07-22 ENCOUNTER — Ambulatory Visit (HOSPITAL_COMMUNITY): Payer: 59

## 2020-07-25 ENCOUNTER — Other Ambulatory Visit: Payer: Self-pay | Admitting: Hematology and Oncology

## 2020-07-25 DIAGNOSIS — C3411 Malignant neoplasm of upper lobe, right bronchus or lung: Secondary | ICD-10-CM

## 2020-07-25 DIAGNOSIS — M899 Disorder of bone, unspecified: Secondary | ICD-10-CM

## 2020-08-09 ENCOUNTER — Other Ambulatory Visit: Payer: Self-pay

## 2020-08-09 ENCOUNTER — Ambulatory Visit (HOSPITAL_COMMUNITY)
Admission: RE | Admit: 2020-08-09 | Discharge: 2020-08-09 | Disposition: A | Discharge status: Home / Self Care | Payer: 59 | Source: Ambulatory Visit | Attending: Hematology and Oncology | Admitting: Hematology and Oncology

## 2020-08-09 ENCOUNTER — Encounter (HOSPITAL_COMMUNITY): Payer: Self-pay

## 2020-08-09 DIAGNOSIS — C3411 Malignant neoplasm of upper lobe, right bronchus or lung: Secondary | ICD-10-CM | POA: Insufficient documentation

## 2020-08-09 LAB — POCT I-STAT CREATININE: Creatinine, Ser: 1 mg/dL (ref 0.61–1.24)

## 2020-08-09 MED ORDER — IOHEXOL 350 MG/ML SOLN
75.0000 mL | Freq: Once | INTRAVENOUS | Status: AC | PRN
  Administered 2020-08-09: 75 mL via INTRAVENOUS

## 2020-08-16 ENCOUNTER — Telehealth: Payer: Self-pay

## 2020-08-16 NOTE — Telephone Encounter (Signed)
Called and LVM for patient regarding upcoming brain MRI. Dates and times given, along with number for Central Scheduling should the appt need to be adjusted. Will also send message via MyChart.

## 2020-08-17 ENCOUNTER — Telehealth: Payer: Self-pay | Admitting: Hematology and Oncology

## 2020-08-17 ENCOUNTER — Other Ambulatory Visit: Payer: Self-pay | Admitting: Emergency Medicine

## 2020-08-17 DIAGNOSIS — C3411 Malignant neoplasm of upper lobe, right bronchus or lung: Secondary | ICD-10-CM

## 2020-08-17 NOTE — Telephone Encounter (Signed)
Scheduled appt per 8/10 sch msg. Pt aware.

## 2020-08-22 ENCOUNTER — Encounter: Payer: Self-pay | Admitting: Urology

## 2020-08-22 NOTE — Progress Notes (Signed)
Patient reports doing well. Denies pain, cough, shortness of breath, no painful swallowing, choking, or weight loss. Patient states good appetite, with mild fatigue.  Meaningful use questions complete. Patient notified of his 8:30am appointment on 08/25/20 and expressed an understanding of it.

## 2020-08-23 ENCOUNTER — Encounter: Payer: Self-pay | Admitting: Hematology and Oncology

## 2020-08-23 ENCOUNTER — Inpatient Hospital Stay: Payer: 59 | Admitting: Emergency Medicine

## 2020-08-23 ENCOUNTER — Inpatient Hospital Stay: Payer: 59

## 2020-08-23 ENCOUNTER — Other Ambulatory Visit: Payer: Self-pay

## 2020-08-23 ENCOUNTER — Inpatient Hospital Stay: Payer: 59 | Attending: Hematology and Oncology | Admitting: Hematology and Oncology

## 2020-08-23 DIAGNOSIS — Z006 Encounter for examination for normal comparison and control in clinical research program: Secondary | ICD-10-CM | POA: Diagnosis present

## 2020-08-23 DIAGNOSIS — C3411 Malignant neoplasm of upper lobe, right bronchus or lung: Secondary | ICD-10-CM

## 2020-08-23 LAB — CBC WITH DIFFERENTIAL/PLATELET
Abs Immature Granulocytes: 0.01 10*3/uL (ref 0.00–0.07)
Basophils Absolute: 0 10*3/uL (ref 0.0–0.1)
Basophils Relative: 1 %
Eosinophils Absolute: 0.2 10*3/uL (ref 0.0–0.5)
Eosinophils Relative: 5 %
HCT: 41.7 % (ref 39.0–52.0)
Hemoglobin: 13.2 g/dL (ref 13.0–17.0)
Immature Granulocytes: 0 %
Lymphocytes Relative: 19 %
Lymphs Abs: 0.8 10*3/uL (ref 0.7–4.0)
MCH: 26.3 pg (ref 26.0–34.0)
MCHC: 31.7 g/dL (ref 30.0–36.0)
MCV: 83.1 fL (ref 80.0–100.0)
Monocytes Absolute: 0.6 10*3/uL (ref 0.1–1.0)
Monocytes Relative: 14 %
Neutro Abs: 2.7 10*3/uL (ref 1.7–7.7)
Neutrophils Relative %: 61 %
Platelets: 209 10*3/uL (ref 150–400)
RBC: 5.02 MIL/uL (ref 4.22–5.81)
RDW: 16.5 % — ABNORMAL HIGH (ref 11.5–15.5)
WBC: 4.5 10*3/uL (ref 4.0–10.5)
nRBC: 0 % (ref 0.0–0.2)

## 2020-08-23 LAB — CMP (CANCER CENTER ONLY)
ALT: 16 U/L (ref 0–44)
AST: 17 U/L (ref 15–41)
Albumin: 3.9 g/dL (ref 3.5–5.0)
Alkaline Phosphatase: 67 U/L (ref 38–126)
Anion gap: 8 (ref 5–15)
BUN: 19 mg/dL (ref 8–23)
CO2: 29 mmol/L (ref 22–32)
Calcium: 10.4 mg/dL — ABNORMAL HIGH (ref 8.9–10.3)
Chloride: 101 mmol/L (ref 98–111)
Creatinine: 1.13 mg/dL (ref 0.61–1.24)
GFR, Estimated: >60 mL/min (ref >60)
Glucose, Bld: 100 mg/dL — ABNORMAL HIGH (ref 70–99)
Potassium: 4.3 mmol/L (ref 3.5–5.1)
Sodium: 138 mmol/L (ref 135–145)
Total Bilirubin: 0.5 mg/dL (ref 0.3–1.2)
Total Protein: 8 g/dL (ref 6.5–8.1)

## 2020-08-23 LAB — TSH: TSH: 0.967 u[IU]/mL (ref 0.320–4.118)

## 2020-08-23 LAB — RESEARCH LABS

## 2020-08-23 NOTE — Progress Notes (Signed)
[facility] FOLLOW UP NOTE  Patient Care Team: Patient, No Pcp Per (Inactive) as PCP - General (General Practice) [name], RN (Inactive) as Registered Nurse  CHIEF COMPLAINTS/PURPOSE OF CONSULTATION:  Limited stage small cell lung cancer, follow-up per research protocol.  ASSESSMENT & PLAN:  Small cell lung cancer, 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HCC) Small cell lung cancer, limited stage  LIMITED STAGE SMALL CELL LUNG CANCER (LS-SCLC): A PHASE III  RANDOMIZED STUDY OF CHEMORADIATION VERSUS CHEMORADIATION PLUS ATEZOLIZUMAB (10-June-2019)   C1D1 cis/etoposide completed on oh 2.10.22. C2D1 completed on 03/15/2020 through 03/17/2020 Cycle 3-day 1 completed on 04/05/2020 through 04/07/2020 C4 D1 completed 4/26-4/28  He is here for follow-up per research protocol. Since last visit, no concerning complaints.  His most recent imaging is consistent with ongoing response.  No concerning review of systems or physical examination findings for progression.  He will also proceed with MRI scheduled in September. He will return to clinic in 3 months per research protocol.  He was encouraged to contact us with any new questions or concerns. Labs reviewed, CBC is completely normal.  No orders of the defined types were placed in this encounter.  He declined MRI femur which was previously recommended for a sclerotic lesion in the right femoral head likely an enchondroma but cannot rule out other abnormalities. No concerning adverse effects from treatment.  HISTORY OF PRESENTING ILLNESS:  Adam [name] 65 y.o. male is here because of limited stage small cell lung cancer.  Oncology History Overview Note  He is currently on the following trial. Please refer to my original note for complete history.  LIMITED STAGE SMALL CELL LUNG CANCER (LS-SCLC): A PHASE III  RANDOMIZED STUDY OF CHEMORADIATION VERSUS CHEMORADIATION PLUS ATEZOLIZUMAB (10-June-2019)  C1 D1 02/16/2020-02/18/2020 He has  consented for the trial and is enrolled to the arm without immunotherapy. C2 D1-D3 03/15/2020-03/17/2020 C3D1 completed 04/05/2020 through 04/07/2020 C4D1 delayed by a week because of neutropenia. C4D1 completed 4/26-22-05/05/2020   Primary cancer of right upper lobe of lung ([place])  02/06/2020 Initial Diagnosis   Primary cancer of right upper lobe of lung ([place])   02/10/2020 Cancer Staging   Staging form: Lung, AJCC 8th Edition - Pathologic stage from 02/10/2020: No Stage Recommended (cT1c, cN2, cM0) - Signed by Benay Pike, MD on 02/10/2020 Histopathologic type: Small cell carcinoma, NOS Stage prefix: Initial diagnosis Histologic grade (G): GX Histologic grading system: 4 grade system   Small cell lung cancer, 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[place])  02/10/2020 Initial Diagnosis   Small cell lung cancer in adult [facility])   02/16/2020 - 02/18/2020 Chemotherapy          03/15/2020 -  Chemotherapy    Patient is on Treatment Plan: LUNG SMALL CELL - LIMITED STAGE RESEARCH [id number] CISPLATIN D1 / ETOPOSIDE D1-3 ARM 1        INTERIM HISTORY  Mr. Adam [name] is here for follow-up by himself.  He denies any new health complaints.  He gained some weight since his last visit.  He denies any cough, shortness of breath or hemoptysis.  No difficulty swallowing.  No shortness of breath on exertion.  No change in bowel habits or urinary habits.  No headaches, change in vision, balance issues or falls.  He is still tries to stay active, using his exercise bike.  Rest of the pertinent 10 point ROS reviewed and negative.   REVIEW OF SYSTEMS:   Constitutional: Denies fevers, chills or abnormal night sweats Eyes: Denies blurriness of vision, double vision or  watery eyes Ears, nose, mouth, throat, and face: Denies mucositis or sore throat Respiratory: Denies cough, dyspnea or wheezes Cardiovascular: Denies palpitation, chest discomfort or lower extremity swelling Gastrointestinal:  Denies nausea, heartburn or change in bowel  habits Skin: Denies abnormal skin rashes Lymphatics: Denies new lymphadenopathy or easy bruising Neurological:Denies numbness, tingling or new weaknesses Behavioral/Psych: Mood is stable, no new changes  All other systems were reviewed with the patient and are negative.   MEDICAL HISTORY:  Past Medical History:  Diagnosis Date   Hypertension    Lung cancer ([place])    small cell RUL    SURGICAL HISTORY: Past Surgical History:  Procedure Laterality Date   BRONCHIAL BRUSHINGS  02/08/2020   Procedure: BRONCHIAL BRUSHINGS;  Surgeon: Freddi Starr, MD;  Location[facility] ENDOSCOPY;  Service: Pulmonary;;   BRONCHIAL NEEDLE ASPIRATION BIOPSY  02/08/2020   Procedure: BRONCHIAL NEEDLE ASPIRATION BIOPSIES;  Surgeon: Freddi Starr, MD;  Location: [facility] ENDOSCOPY;  Service: Pulmonary;;   KNEE SURGERY Right    SHOULDER SURGERY Left    VIDEO BRONCHOSCOPY WITH ENDOBRONCHIAL ULTRASOUND N/A 02/08/2020   Procedure: VIDEO BRONCHOSCOPY WITH ENDOBRONCHIAL ULTRASOUND;  Surgeon: Freddi Starr, MD;  Location: [facility];  Service: Pulmonary;  Laterality: N/A;    SOCIAL HISTORY: Social History   Socioeconomic History   Marital status: Divorced    Spouse name: Development worker, community   Number of children: Not on file   Years of education: Not on file   Highest education level: Not on file  Occupational History   Not on file  Tobacco Use   Smoking status: Former    Packs/day: 1.00    Years: 20.00    Pack years: 20.00    Types: Cigarettes    Quit date: 06/09/2018    Years since quitting: 2.2   Smokeless tobacco: Never  Vaping Use   Vaping Use: Never used  Substance and Sexual Activity   Alcohol use: Not Currently   Drug use: Never   Sexual activity: Yes  Other Topics Concern   Not on file  Social History Narrative   Not on file   Social Determinants of Health   Financial Resource Strain: Not on file  Food Insecurity: Not on file  Transportation Needs: Not on file  Physical Activity: Not on file   Stress: Not on file  Social Connections: Not on file  Intimate Partner Violence: Not on file    FAMILY HISTORY: Family History  Problem Relation Age of Onset   Heart disease Mother    Cancer Cousin    Breast cancer Neg Hx    Colon cancer Neg Hx    Pancreatic cancer Neg Hx    Prostate cancer Neg Hx     ALLERGIES:  has No Known Allergies.  MEDICATIONS:  Current Outpatient Medications  Medication Sig Dispense Refill   amLODipine (NORVASC) 10 MG tablet Take 10 mg by mouth daily.     ibuprofen (ADVIL) 600 MG tablet Take 600 mg by mouth every 6 (six) hours as needed.     lisinopril-hydrochlorothiazide (ZESTORETIC) 20-12.5 MG tablet Take 1 tablet by mouth daily.     No current facility-administered medications for this visit.    PHYSICAL EXAMINATION: ECOG PERFORMANCE STATUS: 0 - Asymptomatic  Vitals:   08/23/20 1252  BP: 119/86  Pulse: 92  Resp: 19  Temp: 97.8 F (36.6 C)  SpO2: 100%   Filed Weights   08/23/20 1252  Weight: 193 lb 11.2 oz (87.9 kg)    GENERAL:alert, no distress and  comfortable SKIN: skin color, texture, turgor are normal, no rashes or significant lesions EYES: normal, conjunctiva are pink and non-injected, sclera clear OROPHARYNX:no exudate, no erythema and lips, buccal mucosa, and tongue normal  NECK: supple, thyroid normal size, non-tender, without nodularity LYMPH:  no palpable lymphadenopathy in the cervical, axillary or inguinal LUNGS: clear to auscultation and percussion with normal breathing effort HEART: regular rate & rhythm and no murmurs and no lower extremity edema ABDOMEN:abdomen soft, non-tender and normal bowel sounds Musculoskeletal:no cyanosis of digits and no clubbing  PSYCH: alert & oriented x 3 with fluent speech NEURO: no focal motor/sensory deficits  LABORATORY DATA:  I have reviewed the data as listed Lab Results  Component Value Date   WBC 4.5 08/23/2020   HGB 13.2 08/23/2020   HCT 41.7 08/23/2020   MCV 83.1  08/23/2020   PLT 209 08/23/2020     Chemistry      Component Value Date/Time   NA 142 05/31/2020 1013   K 4.4 05/31/2020 1013   CL 104 05/31/2020 1013   CO2 27 05/31/2020 1013   BUN 12 05/31/2020 1013   CREATININE 1.00 08/09/2020 1243   CREATININE 1.00 05/31/2020 1013      Component Value Date/Time   CALCIUM 10.2 05/31/2020 1013   ALKPHOS 66 05/31/2020 1013   AST 10 (L) 05/31/2020 1013   ALT <6 05/31/2020 1013   BILITOT 0.4 05/31/2020 1013       RADIOGRAPHIC STUDIES: I have personally reviewed the radiological images as listed and agreed with the findings in the report. CT CHEST ABDOMEN PELVIS W CONTRAST  Result Date: 08/10/2020 CLINICAL DATA:  Limited stage small cell lung cancer surveillance EXAM: CT CHEST, ABDOMEN, AND PELVIS WITH CONTRAST TECHNIQUE: Multidetector CT imaging of the chest, abdomen and pelvis was performed following the standard protocol during bolus administration of intravenous contrast. CONTRAST:  67mL OMNIPAQUE IOHEXOL 350 MG/ML SOLN COMPARISON:  05/24/2020 FINDINGS: CT CHEST FINDINGS Cardiovascular: Scattered aortic atherosclerosis. Normal heart size. Three-vessel coronary artery calcifications. No pericardial effusion. Mediastinum/Nodes: Continued interval decrease in size of a pretracheal and right hilar soft tissue mass or lymph node conglomerate, now measuring approximately 4.8 x 2.8 cm in greatest axial dimension, previously 5.4 x 2.5 cm when measured similarly (series 2, image 25). Previously noted discrete pretracheal lymph nodes are resolved (series 2, image 20). Thyroid gland, trachea, and esophagus demonstrate no significant findings. Lungs/Pleura: Moderate centrilobular and paraseptal emphysema. There is new ground-glass and heterogeneous airspace opacity of the right upper lobe with developing fibrosis and volume loss. A previously noted right apical pulmonary nodule is difficult to appreciate against this background, but is not significantly changed,  measuring approximately 1.7 x 1.5 cm (series 6, image 33). No pleural effusion or pneumothorax. Musculoskeletal: No chest wall mass or suspicious bone lesions identified. CT ABDOMEN PELVIS FINDINGS Hepatobiliary: No solid liver abnormality is seen. Unchanged benign subcentimeter cyst of the left lobe of the liver (series 2, image 53). No gallstones, gallbladder wall thickening, or biliary dilatation. Pancreas: Unremarkable. No pancreatic ductal dilatation or surrounding inflammatory changes. Spleen: Normal in size without significant abnormality. Adrenals/Urinary Tract: Adrenal glands are unremarkable. Nonobstructive calculus of the lateral midportion of the left kidney. No ureteral calculi or hydronephrosis. Bladder is unremarkable. Stomach/Bowel: Stomach is within normal limits. Appendix appears normal. No evidence of bowel wall thickening, distention, or inflammatory changes. Descending and sigmoid diverticulosis. Vascular/Lymphatic: Aortic atherosclerosis. No enlarged abdominal or pelvic lymph nodes. Reproductive: No mass or other abnormality. Other: No abdominal wall hernia or abnormality.  No abdominopelvic ascites. Musculoskeletal: No acute or significant osseous findings. Unchanged sclerotic lesion of the right femoral head (series 2, image 111). IMPRESSION: 1. Continued interval decrease in size of a pretracheal and right hilar soft tissue mass or lymph node conglomerate, now measuring approximately 4.8 x 2.8 cm in greatest axial dimension, previously 5.4 x 2.5 cm when measured similarly. 2. Previously noted discrete pretracheal lymph nodes are resolved. 3. A previously noted right apical pulmonary nodule is difficult to appreciate against a background radiation pneumonitis and developing fibrosis, but is not significantly changed, measuring approximately 1.7 x 1.5 cm. 4. No evidence of metastatic disease in the abdomen or pelvis. 5. Unchanged sclerotic lesion of the right femoral head, likely an enchondroma  given appearance and location, osseous metastasis not favored. 6. Emphysema. 7. Coronary artery disease. 8. Nonobstructive left nephrolithiasis. RECIST data: Target lesions of the lung: 1. A spiculated pulmonary nodule of the right apex is not significantly changed in size, largest dimension 1.7 cm. Target lesions of the mediastinum: 1. Pretracheal and right hilar soft tissue mass and or lymph node conglomerate, not significantly changed in size by RECIST criteria, largest dimension 4.8 cm, previously 5.4 cm. 2. Pretracheal lymph nodes, resolved and not discretely measurable, previously 1.6 cm. Interval change in target lesions is consistent with partial treatment response. No new lesions. Aortic Atherosclerosis (ICD10-I70.0) and Emphysema (ICD10-J43.9). Electronically Signed   By: Eddie Candle M.D.   On: 08/10/2020 11:36    I reviewed his images personally and I do agree that he had significant response.  He will have another CT in 3 months per research protocol.  All his questions were answered to the best of my knowledge.    Benay Pike, MD 08/23/2020 1:12 PM

## 2020-08-23 NOTE — Research (Signed)
TRIAL: [id number]-LU005-Limited Stage Small Cell Lung Cancer (LS-SCLC): A Phase II/III Randomized Study of Chemoradiation Versus Chemoradiation Plus Atezolizumab  3 MONTH POST CRT VISIT   Patient arrives Unaccompanied for 3 month post CRT visit.    PROs: Per study protocol, all PROs required for this visit were completed prior to other study activities and completeness has been verified.     LABS: Mandatory and optional labs were collected per study protocol, pt tolerated well without complaint.   MEDICATION REVIEW: Patient reviews and verifies the current medication list is correct.  No allergies per pt.   VITAL SIGNS: Vital signs are collected, no abnormal values.   MD/PROVIDER VISIT & TUMOR MEASUREMENTS: Patient sees MD Iruku for today's visit to discuss results of CT Chest/Abd/Pelvis done on 08/09/2020 and to f/u on how he is doing.  Partial response determined per radiologist and MD Iruku, RECIST 1.1 criteria used.   ADVERSE EVENTS:  All assessed using CTCAE version 5.0 Patient Adam [name] reports no new or worsening symptoms since last visit.  Irvin has a baseline history of hypertension that is managed with hypertension medication, currently no hypertension with today's BP of 119/86 (NO Grade).  Pt also has a baseline history of anemia, currently no anemia with today's hemoglobin of 13.2 (NO Grade). Pt has Grade 1 hyperglycemia today (glucose 100) which MD Iruku has ruled as unrelated to study.  Pt also has Grade 1 hypercalcemia today (10.4) which MD Iruku has also ruled as unrelated to study.  No reporting or intervention required at this time for any AE's.   ADVERSE EVENT LOG:   3 month post End of CRT  Adam [name] Apr 17, 1955 [id number]     Event Grade Onset Date End Date Status Comments Attribution to Cisplatin Attribution to Etoposide  Hyperglycemia 1 05/31/2020  Ongoing No intervention required Unrelated Unrelated  Hypercalcemia 1 08/23/2020  Ongoing No intervention required  Unrelated Unrelated      DISPOSITION: Upon completion off all study requirements, patient was escorted to exit with belongings.  Patient aware that he will have Brain MRI on 09/15/20, will f/u with him closer to study date to remind him.  Brain MRI last performed on 06/15/20 per Rad-Onc protocol before beginning PCI.   The patient was thanked for their time and continued voluntary participation in this study. Patient Adam [name] has been provided direct contact information and is encouraged to contact this Nurse for any needs or questions.  Wells Guiles '[place], RN, BSN Clinical Research Nurse I 08/23/20 3:18 PM

## 2020-08-23 NOTE — Assessment & Plan Note (Signed)
Small cell lung cancer, limited stage  LIMITED STAGE SMALL CELL LUNG CANCER (LS-SCLC): A PHASE III  RANDOMIZED STUDY OF CHEMORADIATION VERSUS CHEMORADIATION PLUS ATEZOLIZUMAB (10-June-2019)  C1D1 cis/etoposide completed on oh 2.10.22. C2D1 completed on 03/15/2020 through 03/17/2020 Cycle 3-day 1 completed on 04/05/2020 through 04/07/2020 C4 D1 completed 4/26-4/28  He is here for follow-up per research protocol. Since last visit, no concerning complaints.  His most recent imaging is consistent with ongoing response.  No concerning review of systems or physical examination findings for progression.  He will also proceed with MRI scheduled in September. He will return to clinic in 3 months per research protocol.  He was encouraged to contact us with any new questions or concerns. Labs reviewed, CBC is completely normal.

## 2020-08-25 ENCOUNTER — Ambulatory Visit
Admission: RE | Admit: 2020-08-25 | Discharge: 2020-08-25 | Disposition: A | Discharge status: Home / Self Care | Payer: 59 | Source: Ambulatory Visit | Attending: Urology | Admitting: Urology

## 2020-08-25 DIAGNOSIS — C3411 Malignant neoplasm of upper lobe, right bronchus or lung: Secondary | ICD-10-CM

## 2020-08-25 NOTE — Progress Notes (Signed)
  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  The patient was immobilized with a thermoplastic mask and isocenter was verified with weekly port films.  Narrative: The patient tolerated radiation treatment relatively well with only mild fatigue and some generalized weakness.  Plan: The patient has completed radiation treatment. The patient will return to radiation oncology clinic for routine followup in one month. I advised them to call or return sooner if they have any questions or concerns related to their recovery or treatment. ________________________________  Sheral Apley. Tammi Klippel, M.D.

## 2020-08-25 NOTE — Progress Notes (Signed)
Radiation Oncology         [phone] ________________________________  Name: Adam [name] MRN: [id number]  Date: 08/25/2020  DOB: 1955-11-25  Post Treatment Note  CC: Patient, No Pcp Per (Inactive)  No ref. 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   Narrative:  I spoke with the patient to conduct his routine scheduled 1 month follow up visit via telephone to spare the patient unnecessary potential exposure in the healthcare setting during the current COVID-19 pandemic.  The patient was notified in advance and gave permission to proceed with this visit format.  He tolerated radiation treatment relatively well with only mild fatigue and some generalized weakness.                              On review of systems, the patient states that he is doing very well in general and is currently without complaints aside from mild residual fatigue which he feels is gradually improving.  He specifically denies headaches, nausea, vomiting, dizziness, changes in visual or auditory acuity, difficulty with speech, imbalance or seizure activity.  Overall, he is pleased with his progress to date.  ALLERGIES:  has No Known Allergies.  Meds: Current Outpatient Medications  Medication Sig Dispense Refill   amLODipine (NORVASC) 10 MG tablet Take 10 mg by mouth daily.     ibuprofen (ADVIL) 600 MG tablet Take 600 mg by mouth every 6 (six) hours as needed.     lisinopril-hydrochlorothiazide (ZESTORETIC) 20-12.5 MG tablet Take 1 tablet by mouth daily.     No current  facility-administered medications for this encounter.    Physical Findings:  vitals were not taken for this visit.  Pain Assessment Pain Score: 0-No pain/10 Unable to assess due to telephone follow-up visit format.  Lab Findings: Lab Results  Component Value Date   WBC 4.5 08/23/2020   HGB 13.2 08/23/2020   HCT 41.7 08/23/2020   MCV 83.1 08/23/2020   PLT 209 08/23/2020     Radiographic Findings: CT CHEST ABDOMEN PELVIS W CONTRAST  Result Date: 08/10/2020 CLINICAL DATA:  Limited stage small cell lung cancer surveillance EXAM: CT CHEST, ABDOMEN, AND PELVIS WITH CONTRAST TECHNIQUE: Multidetector CT imaging of the chest, abdomen and pelvis was performed following the standard protocol during bolus administration of intravenous contrast. CONTRAST:  84mL OMNIPAQUE IOHEXOL 350 MG/ML SOLN COMPARISON:  05/24/2020 FINDINGS: CT CHEST FINDINGS Cardiovascular: Scattered aortic atherosclerosis. Normal heart size. Three-vessel coronary artery calcifications. No pericardial effusion. Mediastinum/Nodes: Continued interval decrease in size of a pretracheal and right hilar soft tissue mass or lymph node conglomerate, now measuring approximately 4.8 x 2.8 cm in greatest axial dimension, previously 5.4 x 2.5 cm when measured similarly (series 2, image 25). Previously noted discrete pretracheal lymph nodes are resolved (series 2, image 20). Thyroid gland, trachea, and esophagus demonstrate no significant findings. Lungs/Pleura: Moderate centrilobular and paraseptal emphysema. There is new ground-glass and heterogeneous airspace opacity of the right upper lobe with developing fibrosis and volume loss.  A previously noted right apical pulmonary nodule is difficult to appreciate against this background, but is not significantly changed, measuring approximately 1.7 x 1.5 cm (series 6, image 33). No pleural effusion or pneumothorax. Musculoskeletal: No chest wall mass or suspicious bone lesions identified. CT ABDOMEN  PELVIS FINDINGS Hepatobiliary: No solid liver abnormality is seen. Unchanged benign subcentimeter cyst of the left lobe of the liver (series 2, image 53). No gallstones, gallbladder wall thickening, or biliary dilatation. Pancreas: Unremarkable. No pancreatic ductal dilatation or surrounding inflammatory changes. Spleen: Normal in size without significant abnormality. Adrenals/Urinary Tract: Adrenal glands are unremarkable. Nonobstructive calculus of the lateral midportion of the left kidney. No ureteral calculi or hydronephrosis. Bladder is unremarkable. Stomach/Bowel: Stomach is within normal limits. Appendix appears normal. No evidence of bowel wall thickening, distention, or inflammatory changes. Descending and sigmoid diverticulosis. Vascular/Lymphatic: Aortic atherosclerosis. No enlarged abdominal or pelvic lymph nodes. Reproductive: No mass or other abnormality. Other: No abdominal wall hernia or abnormality. No abdominopelvic ascites. Musculoskeletal: No acute or significant osseous findings. Unchanged sclerotic lesion of the right femoral head (series 2, image 111). IMPRESSION: 1. Continued interval decrease in size of a pretracheal and right hilar soft tissue mass or lymph node conglomerate, now measuring approximately 4.8 x 2.8 cm in greatest axial dimension, previously 5.4 x 2.5 cm when measured similarly. 2. Previously noted discrete pretracheal lymph nodes are resolved. 3. A previously noted right apical pulmonary nodule is difficult to appreciate against a background radiation pneumonitis and developing fibrosis, but is not significantly changed, measuring approximately 1.7 x 1.5 cm. 4. No evidence of metastatic disease in the abdomen or pelvis. 5. 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 6. Emphysema. 7. Coronary artery disease. 8. Nonobstructive left nephrolithiasis. RECIST data: Target lesions of the  lung: 1. A spiculated pulmonary nodule of the right apex is not significantly changed in size, largest dimension 1.7 cm. Target lesions of the mediastinum: 1. Pretracheal and right hilar soft tissue mass and or lymph node conglomerate, not significantly changed in size by RECIST criteria, largest dimension 4.8 cm, previously 5.4 cm. 2. Pretracheal lymph nodes, resolved and not discretely measurable, previously 1.6 cm. Interval change in target lesions is consistent with partial treatment response. No new lesions. Aortic Atherosclerosis (ICD10-I70.0) and Emphysema (ICD10-J43.9). Electronically Signed   By: Eddie Candle M.D.   On: 08/10/2020 11:36    Impression/Plan: 1. 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 He appears to have recovered well from the effects of his recent radiotherapy and is currently without complaints.  He continues in observation on his current research trial and has had recent restaging systemic imaging on 08/09/2020 which shows a good response to treatment with no new disease.  He is scheduled for a posttreatment MRI brain on 09/15/2020.  We discussed that while we are happy to continue to participate in his care if clinically indicated, at this point, we will plan to see him back on an as-needed basis.  He will continue in routine follow-up under the care and direction of Dr. Chryl Heck to review the results of his upcoming MRI brain scan and continued management of his systemic disease.  He knows that he is welcome to call at anytime with any questions or concerns related to his previous radiotherapy.  We enjoyed taking care of him  and look forward to continuing to follow his progress via correspondence.    Nicholos Johns, PA-C

## 2020-09-08 ENCOUNTER — Telehealth: Payer: Self-pay | Admitting: Emergency Medicine

## 2020-09-08 NOTE — Telephone Encounter (Signed)
TRIAL: [id number]-LU005-Limited Stage Small Cell Lung Cancer (LS-SCLC): A Phase II/III Randomized Study of Chemoradiation Versus Chemoradiation Plus Atezolizumab  Called pt to remind him of upcoming MRI on 09/15/20, pt aware.  Pt also informed he will be seeing Dr. Lindi Adie for his November appts as MD Iruku will be out on maternity leave at that time.  Will call pt closer to November to update him on scheduled scans and MD appt.  Pt denies any further questions or concerns at this time.  Wells Guiles 'Learta CoddingNeysa Bonito, RN, BSN Clinical Research Nurse I 09/08/20 9:48 AM

## 2020-09-15 ENCOUNTER — Other Ambulatory Visit: Payer: Self-pay

## 2020-09-15 ENCOUNTER — Ambulatory Visit (HOSPITAL_COMMUNITY)
Admission: RE | Admit: 2020-09-15 | Discharge: 2020-09-15 | Disposition: A | Discharge status: Home / Self Care | Payer: 59 | Source: Ambulatory Visit | Attending: Hematology and Oncology | Admitting: Hematology and Oncology

## 2020-09-15 DIAGNOSIS — C3411 Malignant neoplasm of upper lobe, right bronchus or lung: Secondary | ICD-10-CM | POA: Insufficient documentation

## 2020-09-15 MED ORDER — GADOBUTROL 1 MMOL/ML IV SOLN
9.0000 mL | Freq: Once | INTRAVENOUS | Status: AC | PRN
  Administered 2020-09-15: 9 mL via INTRAVENOUS

## 2020-09-20 ENCOUNTER — Telehealth: Payer: Self-pay | Admitting: Emergency Medicine

## 2020-09-20 NOTE — Telephone Encounter (Signed)
TRIAL: [id number]-Limited Stage Small Cell Lung Cancer (LS-SCLC): A Phase II/III Randomized Study of Chemoradiation Versus Chemoradiation Plus Atezolizumab  Called pt to f/u on Brain MRI results.  Ok per MD Iruku to let patient know that MRI was negative for any metastasis/cancer findings.  Pt verbalized understanding.  Pt bringing in dental clearance paperwork to [facility] tomorrow for Iruku to complete for his dentist.  Either this nurse or MD Iruku's nurse will call patient once paperwork has been completed.  Pt denies any further questions/concerns at this time.  Wells Guiles 'Adam CoddingNeysa Bonito, RN, BSN Clinical Research Nurse I 09/20/20 10:23 AM

## 2020-10-11 ENCOUNTER — Other Ambulatory Visit: Payer: Self-pay | Admitting: Hematology and Oncology

## 2020-10-11 DIAGNOSIS — C3411 Malignant neoplasm of upper lobe, right bronchus or lung: Secondary | ICD-10-CM

## 2020-10-11 NOTE — Progress Notes (Signed)
Ordered scans per research protocol  Adam [name]

## 2020-10-21 ENCOUNTER — Telehealth: Payer: Self-pay | Admitting: Hematology and Oncology

## 2020-10-21 NOTE — Telephone Encounter (Signed)
Scheduled per 10/14 in basket, pt has been called and confirmed appt

## 2020-11-14 ENCOUNTER — Ambulatory Visit (INDEPENDENT_AMBULATORY_CARE_PROVIDER_SITE_OTHER): Payer: 59 | Admitting: Family Medicine

## 2020-11-14 ENCOUNTER — Other Ambulatory Visit: Payer: Self-pay

## 2020-11-14 ENCOUNTER — Encounter (HOSPITAL_BASED_OUTPATIENT_CLINIC_OR_DEPARTMENT_OTHER): Payer: Self-pay | Admitting: Family Medicine

## 2020-11-14 VITALS — BP 124/90 | HR 69 | Ht 69.0 in | Wt 189.0 lb

## 2020-11-14 DIAGNOSIS — D1723 Benign lipomatous neoplasm of skin and subcutaneous tissue of right leg: Secondary | ICD-10-CM | POA: Diagnosis not present

## 2020-11-14 DIAGNOSIS — Z1211 Encounter for screening for malignant neoplasm of colon: Secondary | ICD-10-CM

## 2020-11-14 DIAGNOSIS — K4090 Unilateral inguinal hernia, without obstruction or gangrene, not specified as recurrent: Secondary | ICD-10-CM

## 2020-11-14 DIAGNOSIS — I10 Essential (primary) hypertension: Secondary | ICD-10-CM

## 2020-11-14 NOTE — Patient Instructions (Signed)
  Medication Instructions:  Your physician recommends that you continue on your current medications as directed. Please refer to the Current Medication list given to you today. --If you need a refill on any your medications before your next appointment, please call your pharmacy first. If no refills are authorized on file call the office.--  Referrals/Procedures/Imaging: A referral has been placed for you to [facility] for a Colonoscopy. Someone from the scheduling department will be in contact with you in regards to coordinating your procedure. If you do not hear from any of the schedulers within 7-10 business days please give their office a call at [phone]. Their endoscopy office hours are Monday - Friday 7:30 am - 6:00 pm  [facility] [address] [place], [place] [address] Phone: [phone]  A referral has been placed for you to [facility] for evaluation and treatment. Someone from the scheduling department will be in contact with you in regards to coordinating your consultation. If you do not hear from any of the schedulers within 7-10 business days please give their office a call.  [name] [address] [place]  [address] [phone]  Follow-Up: Your next appointment:   Your physician recommends that you schedule a follow-up appointment in: 3-4 MONTHS with Dr. de Guam  You will receive a text message or e-mail with a link to a survey about your care and experience with [place] today! We would greatly appreciate your feedback!   Thanks for letting us be apart of your health journey!!  Primary Care and Sports Medicine   Dr. Arlina Robes [place]   We encourage you to activate your patient portal called "MyChart".  Sign up information is provided on this After Visit Summary.  MyChart is used to connect with patients for Virtual Visits (Telemedicine).  Patients are able to view lab/test results,  encounter notes, upcoming appointments, etc.  Non-urgent messages can be sent to your provider as well. To learn more about what you can do with MyChart, please visit --  [email or web address].

## 2020-11-15 ENCOUNTER — Encounter (HOSPITAL_COMMUNITY): Payer: Self-pay

## 2020-11-15 ENCOUNTER — Ambulatory Visit (HOSPITAL_COMMUNITY)
Admission: RE | Admit: 2020-11-15 | Discharge: 2020-11-15 | Disposition: A | Discharge status: Home / Self Care | Payer: 59 | Source: Ambulatory Visit | Attending: Hematology and Oncology | Admitting: Hematology and Oncology

## 2020-11-15 DIAGNOSIS — Z1211 Encounter for screening for malignant neoplasm of colon: Secondary | ICD-10-CM | POA: Insufficient documentation

## 2020-11-15 DIAGNOSIS — K4090 Unilateral inguinal hernia, without obstruction or gangrene, not specified as recurrent: Secondary | ICD-10-CM | POA: Insufficient documentation

## 2020-11-15 DIAGNOSIS — D179 Benign lipomatous neoplasm, unspecified: Secondary | ICD-10-CM | POA: Insufficient documentation

## 2020-11-15 DIAGNOSIS — C3411 Malignant neoplasm of upper lobe, right bronchus or lung: Secondary | ICD-10-CM | POA: Diagnosis present

## 2020-11-15 LAB — POCT I-STAT CREATININE: Creatinine, Ser: 0.9 mg/dL (ref 0.61–1.24)

## 2020-11-15 MED ORDER — LISINOPRIL-HYDROCHLOROTHIAZIDE 20-12.5 MG PO TABS: 1.0000 | ORAL_TABLET | Freq: Every day | ORAL | 1 refills | Status: DC

## 2020-11-15 MED ORDER — IOHEXOL 350 MG/ML SOLN
80.0000 mL | Freq: Once | INTRAVENOUS | Status: AC | PRN
  Administered 2020-11-15: 80 mL via INTRAVENOUS

## 2020-11-15 NOTE — Assessment & Plan Note (Signed)
Last underwent colonoscopy for colon cancer screening more than 10 years ago.  He reports recommendation at that time was for repeat colonoscopy in 10 years Referral to GI to arrange for colonoscopy for colon cancer screening

## 2020-11-15 NOTE — Assessment & Plan Note (Signed)
Blood pressure borderline controlled in office today Continue with current regimen, will refill lisinopril-hydrochlorothiazide Recommend intermittent monitoring of blood pressure at home Recommend DASH diet, regular physical activity

## 2020-11-15 NOTE — Assessment & Plan Note (Signed)
Concern for possible inguinal hernia of left side based on history and exam Will refer to general surgery for further evaluation

## 2020-11-15 NOTE — Assessment & Plan Note (Signed)
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

## 2020-11-15 NOTE — Progress Notes (Signed)
New Patient Office Visit  Subjective:  Patient ID: Adam [name], male    DOB: Jan 02, 1956  Age: 65 y.o. MRN: [id number]  CC:  Chief Complaint  Patient presents with   Establish Care    No prior PCP in the last 3 years. Patient has been seeing oncology for lung cancer and Dental works for gum disease   Groin Pain    Patient is complaining of left hip/inguinal pain. He states he noticed about a year ago that he may have an inguinal hernia and wants it to be assesed   Lipoma    Patient states he has a lipoma on his right hip that he would like assessed by dermatology. He is requesting a referral   Health Screenings    Patient states he is about 1 year overdue for a colonoscopy and would like a referral    HPI Adam [name] is a 65 year old male presenting to establish in clinic.  He has current concerns as outlined above.  Past medical history significant for hypertension, lung cancer.  Hypertension: Current medications include amlodipine and lisinopril-hydrochlorothiazide.  Indicates that he has been on his medications for about 4 years.  Denies any current issues with chest pain or headaches.  He is requesting refill on lisinopril-hydrochlorothiazide.  Right hip mass: Reports that he has had a mass on his right hip for at least 8 to 9 years.  Has been gradually increasing in size, no rapid change noted by patient.  Area is not painful.  Has not had this evaluated before.  Left anterior hip pain: Reports this is been going on for about a year.  Feels that he has mild discomfort in the area, indicates having some swelling that seems to be reducible.  Denies any changes in bowel habits, no change in urination or presence of dysuria.  Past Medical History:  Diagnosis Date   Hypertension    Lung cancer ([place])    small cell RUL    Past Surgical History:  Procedure Laterality Date   BRONCHIAL BRUSHINGS  02/08/2020   Procedure: BRONCHIAL BRUSHINGS;  Surgeon: Freddi Starr, MD;   Location: [facility] ENDOSCOPY;  Service: Pulmonary;;   BRONCHIAL NEEDLE ASPIRATION BIOPSY  02/08/2020   Procedure: BRONCHIAL NEEDLE ASPIRATION BIOPSIES;  Surgeon: Freddi Starr, MD;  Location: [facility] ENDOSCOPY;  Service: Pulmonary;;   KNEE SURGERY Right    SHOULDER SURGERY Left    VIDEO BRONCHOSCOPY WITH ENDOBRONCHIAL ULTRASOUND N/A 02/08/2020   Procedure: VIDEO BRONCHOSCOPY WITH ENDOBRONCHIAL ULTRASOUND;  Surgeon: Freddi Starr, MD;  Location: [place];  Service: Pulmonary;  Laterality: N/A;    Family History  Problem Relation Age of Onset   Heart disease Mother    Cancer Cousin    Breast cancer Neg Hx    Colon cancer Neg Hx    Pancreatic cancer Neg Hx    Prostate cancer Neg Hx     Social History   Socioeconomic History   Marital status: Divorced    Spouse name: Development worker, community   Number of children: Not on file   Years of education: Not on file   Highest education level: Not on file  Occupational History   Not on file  Tobacco Use   Smoking status: Former    Packs/day: 1.00    Years: 20.00    Pack years: 20.00    Types: Cigarettes    Quit date: 06/09/2018    Years since quitting: 2.4   Smokeless tobacco: Never  Vaping Use  Vaping Use: Never used  Substance and Sexual Activity   Alcohol use: Not Currently   Drug use: Never   Sexual activity: Yes  Other Topics Concern   Not on file  Social History Narrative   Not on file   Social Determinants of Health   Financial Resource Strain: Not on file  Food Insecurity: Not on file  Transportation Needs: Not on file  Physical Activity: Not on file  Stress: Not on file  Social Connections: Not on file  Intimate Partner Violence: Not on file    Objective:   Today's Vitals: BP 124/90   Pulse 69   Ht 5\' 9"  (1.753 m)   Wt 189 lb (85.7 kg)   SpO2 96%   BMI 27.91 kg/m   Physical Exam  65 year old male in no acute distress Cardiovascular exam with regular rate and rhythm, no murmurs appreciated Lungs clear to  auscultation bilaterally Over right hip laterally, patient has fairly sizable mass which is soft, nontender, not significantly mobile, no fluctuance or overlying erythema. In left inguinal area, patient with some increased prominence along area of inguinal canal, no significant tenderness with exam for inguinal hernia, no overlying erythema, difficult to appreciate if reducible masses present  Assessment & Plan:   Problem List Items Addressed This Visit       Cardiovascular and Mediastinum   HTN (hypertension) - Primary    Blood pressure borderline controlled in office today Continue with current regimen, will refill lisinopril-hydrochlorothiazide Recommend intermittent monitoring of blood pressure at home Recommend DASH diet, regular physical activity        Other   Inguinal hernia    Concern for possible inguinal hernia of left side based on history and exam Will refer to general surgery for further evaluation      Lipoma    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  He reports recommendation at that time was for repeat colonoscopy in 10 years Referral to GI to arrange for colonoscopy for colon cancer screening       Outpatient Encounter Medications as of 11/14/2020  Medication Sig   amLODipine (NORVASC) 10 MG tablet Take 10 mg by mouth daily.   ibuprofen (ADVIL) 600 MG tablet Take 600 mg by mouth every 6 (six) hours as needed.   lisinopril-hydrochlorothiazide (ZESTORETIC) 20-12.5 MG tablet Take 1 tablet by mouth daily.   No facility-administered encounter medications on file as of 11/14/2020.    Follow-up: No follow-ups on file.  Plan for  follow-up in about 3 to 4 months or sooner as needed  Diondre Pulis J De [place], MD

## 2020-11-17 ENCOUNTER — Telehealth: Payer: Self-pay

## 2020-11-17 NOTE — Telephone Encounter (Signed)
Pt LM for Dr. Chryl Heck wanting to review his CT scan results.  I have called the pt back and advised that we will review his scan results in detail with him at his Tuesday appt. Pt expressed understanding of this information.

## 2020-11-18 ENCOUNTER — Ambulatory Visit (HOSPITAL_COMMUNITY)
Admission: RE | Admit: 2020-11-18 | Discharge: 2020-11-18 | Disposition: A | Discharge status: Home / Self Care | Payer: 59 | Source: Ambulatory Visit | Attending: Hematology and Oncology | Admitting: Hematology and Oncology

## 2020-11-18 ENCOUNTER — Other Ambulatory Visit: Payer: Self-pay

## 2020-11-18 DIAGNOSIS — C3411 Malignant neoplasm of upper lobe, right bronchus or lung: Secondary | ICD-10-CM | POA: Diagnosis present

## 2020-11-18 MED ORDER — GADOBUTROL 1 MMOL/ML IV SOLN
8.0000 mL | Freq: Once | INTRAVENOUS | Status: AC | PRN
  Administered 2020-11-18: 8 mL via INTRAVENOUS

## 2020-11-21 ENCOUNTER — Encounter: Payer: 59 | Admitting: Emergency Medicine

## 2020-11-21 ENCOUNTER — Ambulatory Visit: Payer: 59 | Admitting: Hematology and Oncology

## 2020-11-21 ENCOUNTER — Other Ambulatory Visit: Payer: 59

## 2020-11-22 ENCOUNTER — Encounter: Payer: Self-pay | Admitting: Internal Medicine

## 2020-11-22 ENCOUNTER — Other Ambulatory Visit: Payer: Self-pay

## 2020-11-22 ENCOUNTER — Inpatient Hospital Stay (HOSPITAL_BASED_OUTPATIENT_CLINIC_OR_DEPARTMENT_OTHER): Payer: 59 | Admitting: Internal Medicine

## 2020-11-22 ENCOUNTER — Inpatient Hospital Stay: Payer: 59 | Admitting: Emergency Medicine

## 2020-11-22 ENCOUNTER — Inpatient Hospital Stay: Payer: 59 | Attending: Hematology and Oncology

## 2020-11-22 VITALS — BP 123/90 | HR 96 | Temp 97.7°F | Resp 18

## 2020-11-22 DIAGNOSIS — J90 Pleural effusion, not elsewhere classified: Secondary | ICD-10-CM | POA: Diagnosis not present

## 2020-11-22 DIAGNOSIS — N200 Calculus of kidney: Secondary | ICD-10-CM | POA: Diagnosis not present

## 2020-11-22 DIAGNOSIS — I2510 Atherosclerotic heart disease of native coronary artery without angina pectoris: Secondary | ICD-10-CM | POA: Insufficient documentation

## 2020-11-22 DIAGNOSIS — R222 Localized swelling, mass and lump, trunk: Secondary | ICD-10-CM | POA: Insufficient documentation

## 2020-11-22 DIAGNOSIS — I10 Essential (primary) hypertension: Secondary | ICD-10-CM | POA: Diagnosis not present

## 2020-11-22 DIAGNOSIS — Z923 Personal history of irradiation: Secondary | ICD-10-CM | POA: Insufficient documentation

## 2020-11-22 DIAGNOSIS — I700 Atherosclerosis of aorta: Secondary | ICD-10-CM | POA: Diagnosis not present

## 2020-11-22 DIAGNOSIS — Z006 Encounter for examination for normal comparison and control in clinical research program: Secondary | ICD-10-CM

## 2020-11-22 DIAGNOSIS — Z9221 Personal history of antineoplastic chemotherapy: Secondary | ICD-10-CM | POA: Insufficient documentation

## 2020-11-22 DIAGNOSIS — C3411 Malignant neoplasm of upper lobe, right bronchus or lung: Secondary | ICD-10-CM | POA: Insufficient documentation

## 2020-11-22 DIAGNOSIS — J432 Centrilobular emphysema: Secondary | ICD-10-CM | POA: Diagnosis not present

## 2020-11-22 DIAGNOSIS — R590 Localized enlarged lymph nodes: Secondary | ICD-10-CM | POA: Insufficient documentation

## 2020-11-22 DIAGNOSIS — C3490 Malignant neoplasm of unspecified part of unspecified bronchus or lung: Secondary | ICD-10-CM | POA: Diagnosis not present

## 2020-11-22 DIAGNOSIS — Z79899 Other long term (current) drug therapy: Secondary | ICD-10-CM | POA: Diagnosis not present

## 2020-11-22 DIAGNOSIS — R5383 Other fatigue: Secondary | ICD-10-CM | POA: Diagnosis not present

## 2020-11-22 DIAGNOSIS — M898X9 Other specified disorders of bone, unspecified site: Secondary | ICD-10-CM | POA: Insufficient documentation

## 2020-11-22 DIAGNOSIS — M25552 Pain in left hip: Secondary | ICD-10-CM | POA: Diagnosis not present

## 2020-11-22 LAB — CBC WITH DIFFERENTIAL (CANCER CENTER ONLY)
Abs Immature Granulocytes: 0.01 10*3/uL (ref 0.00–0.07)
Basophils Absolute: 0 10*3/uL (ref 0.0–0.1)
Basophils Relative: 1 %
Eosinophils Absolute: 0.1 10*3/uL (ref 0.0–0.5)
Eosinophils Relative: 3 %
HCT: 43.1 % (ref 39.0–52.0)
Hemoglobin: 13.9 g/dL (ref 13.0–17.0)
Immature Granulocytes: 0 %
Lymphocytes Relative: 27 %
Lymphs Abs: 0.8 10*3/uL (ref 0.7–4.0)
MCH: 26.2 pg (ref 26.0–34.0)
MCHC: 32.3 g/dL (ref 30.0–36.0)
MCV: 81.3 fL (ref 80.0–100.0)
Monocytes Absolute: 0.6 10*3/uL (ref 0.1–1.0)
Monocytes Relative: 21 %
Neutro Abs: 1.4 10*3/uL — ABNORMAL LOW (ref 1.7–7.7)
Neutrophils Relative %: 48 %
Platelet Count: 215 10*3/uL (ref 150–400)
RBC: 5.3 MIL/uL (ref 4.22–5.81)
RDW: 15.1 % (ref 11.5–15.5)
WBC Count: 2.9 10*3/uL — ABNORMAL LOW (ref 4.0–10.5)
nRBC: 0 % (ref 0.0–0.2)

## 2020-11-22 LAB — CMP (CANCER CENTER ONLY)
ALT: 22 U/L (ref 0–44)
AST: 24 U/L (ref 15–41)
Albumin: 4 g/dL (ref 3.5–5.0)
Alkaline Phosphatase: 76 U/L (ref 38–126)
Anion gap: 10 (ref 5–15)
BUN: 17 mg/dL (ref 8–23)
CO2: 27 mmol/L (ref 22–32)
Calcium: 10.2 mg/dL (ref 8.9–10.3)
Chloride: 101 mmol/L (ref 98–111)
Creatinine: 1.18 mg/dL (ref 0.61–1.24)
GFR, Estimated: >60 mL/min (ref >60)
Glucose, Bld: 84 mg/dL (ref 70–99)
Potassium: 3.7 mmol/L (ref 3.5–5.1)
Sodium: 138 mmol/L (ref 135–145)
Total Bilirubin: 0.4 mg/dL (ref 0.3–1.2)
Total Protein: 8 g/dL (ref 6.5–8.1)

## 2020-11-22 LAB — TSH: TSH: 1.537 u[IU]/mL (ref 0.320–4.118)

## 2020-11-22 NOTE — Research (Addendum)
TRIAL: [id number]-LU005-Limited Stage Small Cell Lung Cancer (LS-SCLC): A Phase II/III Randomized Study of Chemoradiation Versus Chemoradiation Plus Atezolizumab   Patient arrives today Unaccompanied for the 6 month post End of CRT visit.    PROs: Per study protocol, all PROs required for this visit were completed prior to other study activities and completeness has been verified.     LABS: Mandatory labs are collected per consent and study protocol: Patient Adam [name] tolerated well without complaint.  CBC and CMP collected.   MEDICATION REVIEW: Patient reviews and verifies the current medication list is correct.  VITAL SIGNS: Vital signs are collected per study protocol.  Height 5'9".  Weight 189 lb 3 oz.  See MD Julien Nordmann visit note for remaining VS.  MD/PROVIDER VISIT: Patient sees MD [facility] for today's visit to discuss results of CT Chest/Abd/Pelvis done on 11/15/20 and Brain MRI done on 11/18/20 and to f/u on how he is doing.  Progression of disease determined per radiologist and MD Mohamed, RECIST 1.1 criteria used.  MD Julien Nordmann wants to give chemo/immuno therapy to patient and have him re-evaluated for radiation by Dr. Tammi Klippel.  MD ordered PET scan to be done in next 7-10 days and to restart chemo after Thanksgiving. Patient has a baseline raised lump on his R hip that is painless that he states has been present for several years before cancer diagnosis, but that he has recently decided to have it evaluated by his PCP.  He was referred by PCP for this lipoma to general surgery.  MD Julien Nordmann also has been made aware of this lump and states that this will also be evaluated on his PET scan.  Patient also had declined an MRI of femur during last visit with MD Iruku for lesion on right femoral head found on CT scan done on 02/07/20 (re-read 06/02/20), stated he did not want to f/u at that time.  MD Iruku did not feel it was concerning at that time.   ADVERSE EVENTS: All assessed using CTCAE version  5.0 Patient [name] has a baseline history of hypertension managed with medication, Grade 2 today with BP of 123/90.  Incorrectly attributed Grade 0 to hypertension last Research RN note (08/23/20), pt was Grade 1. Patient also has baseline history of anemia. CBC today revealed WBC of 2.9, which is a Grade 1 white blood cell decrease, and ANC of 1.4, which is a Grade 1 neutrophil count decrease.  Hypercalcemia and hyperglycemia have both resolved since last visit. Patient reports Grade 2 left hip pain (arthralgia) that started 1 week ago upon awakening, denies injury.  Pt reports pain with ambulation but none at rest, and needing to use cane. Patient does not report any other AEs at this time. Reviewed all above AE's with MD Julien Nordmann who ruled that all are unrelated to study, no action or reporting necessary at this time.  ADVERSE EVENT LOGDAULTON HARBAUGH [id number]  11/22/2020  Adverse Event Log  Study/Protocol: NRG LU005 Cycle: 6 months post End of CRT  Event Grade Onset Date Resolved Date Drug Name Attribution Treatment Comments  Hyperglycemia Grade 1 05/31/20 11/22/20 None Unrelated None No intervention required  Hypercalcemia Grade 1 08/23/20 11/22/20 None Unrelated None No intervention required  White blood cell decrease Grade 1 11/22/20 Ongoing None Unrelated None No intervention required    Neutrophil count decrease Grade 1 11/22/20 Ongoing None Unrelated None No intervention required    Arthralgia Grade 2 11/15/20 Ongoing None Unrelated None No intervention required  DISPOSITION: Upon completion off all study requirements, patient was escorted to exit with belongings.   The patient was thanked for their time and continued voluntary participation in this study. Patient [name] has been provided direct contact information and is encouraged to contact this Nurse for any needs or questions.  Wells Guiles 'Learta CoddingNeysa Bonito, RN, BSN Clinical Research Nurse I 11/24/20 11:39  AM

## 2020-11-22 NOTE — Progress Notes (Signed)
[place] [phone]   [phone]  OFFICE PROGRESS NOTE  de [place], [name], MD [address] [place] [id number]  DIAGNOSIS: Limited stage (T1c, N2, M0) small cell lung cancer diagnosed in February 2022.  PRIOR THERAPY: Systemic chemotherapy according to phase 3 randomized clinical trial of chemoradiation versus chemoradiation plus atezolizumab status post 3 cycles.  This treatment was discontinued secondary to intolerance.  CURRENT THERAPY: Observation.  INTERVAL HISTORY: Adam [name] 65 y.o. male is a patient of Dr. Chryl Heck who is here today for evaluation and discussion of his recent scan results and treatment options in her maternity absence.  The patient is feeling fine today with no concerning complaints except for pain on the left hip area.  He has no current chest pain, shortness of breath, cough or hemoptysis.  He denied having any fever or chills.  He has no nausea, vomiting, diarrhea or constipation.  He denied having any headache or visual changes.  He had repeat CT scan of the chest, abdomen pelvis performed recently and he is here for evaluation and discussion of his scan results.  MEDICAL HISTORY: Past Medical History:  Diagnosis Date   Hypertension    Lung cancer ([facility])    small cell RUL    ALLERGIES:  has No Known Allergies.  MEDICATIONS:  Current Outpatient Medications  Medication Sig Dispense Refill   amLODipine (NORVASC) 10 MG tablet Take 10 mg by mouth daily.     ibuprofen (ADVIL) 600 MG tablet Take 600 mg by mouth every 6 (six) hours as needed.     lisinopril-hydrochlorothiazide (ZESTORETIC) 20-12.5 MG tablet Take 1 tablet by mouth daily. 90 tablet 1   No current facility-administered medications for this visit.    SURGICAL HISTORY:  Past Surgical History:  Procedure Laterality Date   BRONCHIAL BRUSHINGS  02/08/2020   Procedure: BRONCHIAL BRUSHINGS;  Surgeon: Freddi Starr, MD;  Location: [facility];  Service: Pulmonary;;   BRONCHIAL NEEDLE ASPIRATION BIOPSY  02/08/2020   Procedure: BRONCHIAL NEEDLE ASPIRATION BIOPSIES;  Surgeon: Freddi Starr, MD;  Location: [facility];  Service: Pulmonary;;   KNEE SURGERY Right    SHOULDER SURGERY Left    VIDEO BRONCHOSCOPY WITH ENDOBRONCHIAL ULTRASOUND N/A 02/08/2020   Procedure: VIDEO BRONCHOSCOPY WITH ENDOBRONCHIAL ULTRASOUND;  Surgeon: Freddi Starr, MD;  Location: [facility];  Service: Pulmonary;  Laterality: N/A;    REVIEW OF SYSTEMS:  Constitutional: positive for fatigue Eyes: negative Ears, nose, mouth, throat, and face: negative Respiratory: negative Cardiovascular: negative Gastrointestinal: negative Genitourinary:negative Integument/breast: negative Hematologic/lymphatic: negative Musculoskeletal:positive for bone pain Neurological: negative Behavioral/Psych: negative Endocrine: negative Allergic/Immunologic: negative   PHYSICAL EXAMINATION: General appearance: alert, cooperative, and no distress Head: Normocephalic, without obvious abnormality, atraumatic Neck: no adenopathy, no JVD, supple, symmetrical, trachea midline, and thyroid not enlarged, symmetric, no tenderness/mass/nodules Lymph nodes: Cervical, supraclavicular, and axillary nodes normal. Resp: clear to auscultation bilaterally Back: symmetric, no curvature. ROM normal. No CVA tenderness. Cardio: regular rate and rhythm, S1, S2 normal, no murmur, click, rub or gallop GI: soft, non-tender; bowel sounds normal; no masses,  no organomegaly Extremities: extremities normal, atraumatic, no cyanosis or edema Neurologic: Alert and oriented X 3, normal strength and tone. Normal symmetric reflexes. Normal coordination and gait  ECOG PERFORMANCE STATUS: 1 - Symptomatic but completely ambulatory  Blood pressure 123/90, pulse 96, temperature 97.7 F (36.5 C), temperature source Tympanic, resp. rate 18, SpO2 100 %.  LABORATORY DATA: Lab Results   Component Value Date  WBC 2.9 (L) 11/22/2020   HGB 13.9 11/22/2020   HCT 43.1 11/22/2020   MCV 81.3 11/22/2020   PLT 215 11/22/2020      Chemistry      Component Value Date/Time   NA 138 11/22/2020 1129   K 3.7 11/22/2020 1129   CL 101 11/22/2020 1129   CO2 27 11/22/2020 1129   BUN 17 11/22/2020 1129   CREATININE 1.18 11/22/2020 1129      Component Value Date/Time   CALCIUM 10.2 11/22/2020 1129   ALKPHOS 76 11/22/2020 1129   AST 24 11/22/2020 1129   ALT 22 11/22/2020 1129   BILITOT 0.4 11/22/2020 1129       RADIOGRAPHIC STUDIES: MR Brain W Wo Contrast  Result Date: 11/18/2020 CLINICAL DATA:  Small cell lung cancer surveillance EXAM: MRI HEAD WITHOUT AND WITH CONTRAST TECHNIQUE: Multiplanar, multiecho pulse sequences of the brain and surrounding structures were obtained without and with intravenous contrast. CONTRAST:  81mL GADAVIST GADOBUTROL 1 MMOL/ML IV SOLN COMPARISON:  MR head 09/15/2020. FINDINGS: Brain: There is no acute infarction or intracranial hemorrhage. There is no intracranial mass, mass effect, or edema. There is no hydrocephalus or extra-axial fluid collection. 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 Additional patchy and confluent T2 hyperintensity in the supratentorial white matter is nonspecific but may reflect mild to moderate chronic microvascular ischemic changes. 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 No new abnormal enhancement. Vascular: Major vessel flow voids at the skull base are preserved. Skull and upper cervical spine: Normal marrow signal is preserved. Sinuses/Orbits: Paranasal sinuses are aerated. Orbits are unremarkable. Other: Sella is unremarkable.  Mastoid air cells are clear. IMPRESSION: No evidence of intracranial metastatic disease. Electronically Signed    By: Macy Mis M.D.   On: 11/18/2020 14:05   CT CHEST ABDOMEN PELVIS W CONTRAST  Result Date: 11/16/2020 CLINICAL DATA:  65 year old male with history of small cell lung cancer in the right upper lobe status post chemotherapy and radiation therapy (radiation therapy to the lung completed in April 2022). Follow-up study. EXAM: CT CHEST, ABDOMEN, AND PELVIS WITH CONTRAST TECHNIQUE: Multidetector CT imaging of the chest, abdomen and pelvis was performed following the standard protocol during bolus administration of intravenous contrast. CONTRAST:  17mL OMNIPAQUE IOHEXOL 350 MG/ML SOLN COMPARISON:  CT the chest, abdomen and pelvis 08/09/2020. FINDINGS: RECIST 1.1 Target Lesions: 1. Nodular area of architectural distortion in the apex of the 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axial image 34 of series 6), stable measuring 1.7 x 1.4 cm (previously 1.8 x 1.5 cm). 2. Amorphous soft tissue mass in the low right paratracheal nodal station extending into the right hilar region (axial image 23 of series 2) measuring 4.3 x 2.5 cm, slightly decreased compared to the prior study, previously 4.8 x 2.8 cm. 3. New target lesion in the central aspect of the right lung associated with the major fissure (axial image 59 of series 6) measuring 3.0 x 2.1 cm. 4. New target lesion in the middle mediastinum (axial image 30 of series 2) measuring 3.3 x 2.5 cm, presumably an enlarged lymph node, although the possibility of a primary lesion associated with the mid esophagus is not excluded. 5. New pleural based nodule in the periphery of the right hemithorax (axial image 25 of series 2) measuring 2.8 x 1.9 cm. CT CHEST FINDINGS Cardiovascular: Heart size is normal. There is no  significant pericardial fluid, thickening or pericardial calcification. There is aortic atherosclerosis, as well as atherosclerosis of the great vessels of the mediastinum and the coronary arteries, including calcified atherosclerotic plaque in the left main, left anterior  descending, left circumflex and right coronary arteries. Mediastinum/Nodes: Amorphous soft tissue mass in the low right paratracheal nodal station extending into the right hilar region (axial image 23 of series 2) measuring 4.3 x 2.5 cm, slightly decreased compared to the prior study, previously 4.8 x 2.8 cm. In addition, there is a new lesion in the middle mediastinum (axial image 30 of series 2) measuring 3.3 x 2.5 cm, presumably an enlarged lymph node, although the possibility of a primary lesion associated with the mid esophagus is not excluded. No axillary lymphadenopathy. Lungs/Pleura: Nodular area of architectural distortion in the apex of the 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axial image 34 of series 6), stable measuring 1.7 x 1.4 cm (previously 1.8 x 1.5 cm). 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 In the superior aspect of the right major fissure there is a new lesion lesion in the central aspect of the right lung (axial image 59 of series 6) measuring 3.0 x 2.1 cm. Several surrounding pulmonary nodules are also noted associated with adjacent portions of the right major and minor fissures. Several pleural based nodules are noted in the right hemithorax, the largest of which is in the periphery of the right hemithorax (axial image 25 of series 2) measuring 2.8 x 1.9 cm. Enlarging trace right pleural effusion, likely malignant. Diffuse bronchial wall thickening with moderate centrilobular and paraseptal emphysema. Musculoskeletal: There are no aggressive appearing lytic or blastic lesions noted in the visualized portions of the skeleton. CT ABDOMEN PELVIS FINDINGS Hepatobiliary: 8 mm low-attenuation lesion in segment 2 of the liver, too small to characterize, but stable compared to prior studies and statistically likely to represent a cyst. 3 mm lesion in segment 4  B also stable, also likely a tiny cyst but too small to characterize. No new suspicious appearing hepatic lesions. No intra or extrahepatic biliary ductal dilatation. Gallbladder is normal in appearance. 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 No pancreatic ductal dilatation. No peripancreatic fluid collections or inflammatory changes. Spleen: Unremarkable.  Small splenules inferior to the spleen. Adrenals/Urinary Tract: 8 mm nonobstructive calculus in the lower pole collecting system of left kidney. Subcentimeter low-attenuation lesion in the posterior aspect of the interpolar region of the left kidney too small to characterize, but statistically likely to represent a tiny cyst. Right kidney and bilateral adrenal glands are normal in appearance. No hydroureteronephrosis. Urinary bladder is nearly decompressed, but otherwise unremarkable in appearance. Stomach/Bowel: The appearance of the stomach is normal. No pathologic dilatation of small bowel or colon. A few scattered colonic diverticulae are noted, without surrounding inflammatory changes to suggest an acute diverticulitis at this time. Normal appendix. Vascular/Lymphatic: Aortic atherosclerosis, without evidence of aneurysm or dissection in the abdominal or pelvic vasculature. No lymphadenopathy noted in the abdomen or pelvis. Reproductive: Prostate gland and seminal vesicles are unremarkable in appearance. Other: No significant volume of ascites.  No pneumoperitoneum. Musculoskeletal: New lytic lesion in the parasymphyseal region  of the left pubic bone (axial image 114 of series 2), likely metastatic. IMPRESSION: 1. Definitive progression of disease as evidenced by new mass centered in the superior aspect of the right  major fissure, widespread nodularity in the adjacent portion of the upper right lung, multiple new pleural based lesions in the right hemithorax, new middle mediastinal lymphadenopathy, new hypovascular lesion in the proximal body of the pancreas, and new lytic lesion in the parasymphyseal region of the left pubic bone, as detailed above. 2. Diffuse bronchial wall thickening with moderate centrilobular and paraseptal emphysema; imaging findings suggestive of underlying COPD. 3. Aortic atherosclerosis, in addition to left main and 3 vessel coronary artery disease. Please note that although the presence of coronary artery calcium documents the presence of coronary artery disease, the severity of this disease and any potential stenosis cannot be assessed on this non-gated CT examination. Assessment for potential risk factor modification, dietary therapy or pharmacologic therapy may be warranted, if clinically indicated. 4. Additional incidental findings, as above. These results will be called to the ordering clinician or representative by the Radiologist Assistant, and communication documented in the PACS or [employer]. Electronically Signed   By: Vinnie Langton M.D.   On: 11/16/2020 10:45    ASSESSMENT AND PLAN: This is a very pleasant 65 years old African-American male recently diagnosed with limited stage small cell lung cancer in February 2022.  The patient is currently undergoing treatment according to the [place] clinical trial where the patient would be randomized to treatment with concurrent chemoradiation with cisplatin and/or etoposide versus concurrent chemoradiation with the same regimen in addition to atezolizumab. He is status post 3 cycles of the treatment.  His treatment was discontinued secondary to intolerance and the patient has been in observation since April 2022. He had MRI of the brain performed recently that showed no evidence of metastatic disease to the brain. Repeat CT scan of  the chest, abdomen and pelvis unfortunately showed evidence for disease progression involving new mass centered in the superior aspect of the right major fissure, widespread nodularity in the adjacent portion of the upper right lung, multiple new pleural-based lesions in the right hemithorax, new middle mediastinal lymphadenopathy, new hypervascular lesion in the proximal body of the pancreas and new lytic lesions in the parasymphyseal region of the left to pubic bone. I personally and independently reviewed the scan images and discussed the results with the patient today. I recommended for the patient to have repeat PET scan for further evaluation of his disease.  In the meantime I will refer the patient to Dr. Tammi Klippel for consideration of palliative radiotherapy to the left hip area that is causing the patient a lot of pain at this point. I will see the patient back for follow-up visit in around 2 weeks for evaluation and detailed discussion of his treatment options based on the PET scan findings.  He is likely to benefit from systemic chemotherapy with carboplatin, etoposide and Imfinzi with Cosela support for Myeloprotection. The patient was advised to call immediately if he has any other concerning symptoms in the interval. The patient voices understanding of current disease status and treatment options and is in agreement with the current care plan.  All questions were answered. The patient knows to call the clinic with any problems, questions or concerns. We can certainly see the patient much sooner if necessary.  The total time spent in the appointment was 35 minutes.  Disclaimer: This note was dictated with voice recognition software. Similar  sounding words can inadvertently be transcribed and may not be corrected upon review.

## 2020-11-24 ENCOUNTER — Encounter: Payer: Self-pay | Admitting: Hematology and Oncology

## 2020-11-24 NOTE — Research (Addendum)
TRIAL: [id number] Stage Small Cell Lung Cancer (LS-SCLC): A Phase II/III Randomized Study of Chemoradiation Versus Chemoradiation Plus Atezolizumab  LATE ENTRY TO 11/22/20 RESEARCH RN NOTE  Upon further review of PRO-CTCAE filled out by patient on 11/22/20 it was found that pt reported mild heart palpitations.  Per section 7.8 of protocol this is to be graded using CTCAE (version 5.0 used).  Grade 1 palpitations that started about a week before 11/15 appt.  Per MD Mohamed unrelated to study, no intervention or reporting necessary at this time.  Adverse Event Log  Event Grade Onset Date Resolved Date Drug Name Attribution Treatment Comments  Heart palpitations Grade 1 11/15/20 Ongoing None Unrelated None Not action or reporting necessary    Wells Guiles 'Learta CoddingNeysa Bonito, RN, BSN Clinical Research Nurse I 11/24/20 3:38 PM

## 2020-11-29 ENCOUNTER — Ambulatory Visit (HOSPITAL_COMMUNITY): Payer: 59

## 2020-11-29 ENCOUNTER — Telehealth: Payer: Self-pay | Admitting: Medical Oncology

## 2020-11-29 NOTE — Telephone Encounter (Signed)
I told pt to keep appt next Wednesday and he does not need to get the PET scan before.

## 2020-12-07 ENCOUNTER — Encounter: Payer: Self-pay | Admitting: Internal Medicine

## 2020-12-07 ENCOUNTER — Other Ambulatory Visit: Payer: Self-pay

## 2020-12-07 ENCOUNTER — Inpatient Hospital Stay: Payer: 59

## 2020-12-07 ENCOUNTER — Inpatient Hospital Stay (HOSPITAL_BASED_OUTPATIENT_CLINIC_OR_DEPARTMENT_OTHER): Payer: 59 | Admitting: Internal Medicine

## 2020-12-07 VITALS — BP 129/92 | HR 94 | Temp 96.9°F | Resp 19 | Ht 69.0 in | Wt 187.3 lb

## 2020-12-07 DIAGNOSIS — C3411 Malignant neoplasm of upper lobe, right bronchus or lung: Secondary | ICD-10-CM

## 2020-12-07 DIAGNOSIS — C3490 Malignant neoplasm of unspecified part of unspecified bronchus or lung: Secondary | ICD-10-CM

## 2020-12-07 LAB — CBC WITH DIFFERENTIAL (CANCER CENTER ONLY)
Abs Immature Granulocytes: 0.01 10*3/uL (ref 0.00–0.07)
Basophils Absolute: 0 10*3/uL (ref 0.0–0.1)
Basophils Relative: 1 %
Eosinophils Absolute: 0.2 10*3/uL (ref 0.0–0.5)
Eosinophils Relative: 3 %
HCT: 40.3 % (ref 39.0–52.0)
Hemoglobin: 13.1 g/dL (ref 13.0–17.0)
Immature Granulocytes: 0 %
Lymphocytes Relative: 17 %
Lymphs Abs: 0.9 10*3/uL (ref 0.7–4.0)
MCH: 26.4 pg (ref 26.0–34.0)
MCHC: 32.5 g/dL (ref 30.0–36.0)
MCV: 81.3 fL (ref 80.0–100.0)
Monocytes Absolute: 0.5 10*3/uL (ref 0.1–1.0)
Monocytes Relative: 10 %
Neutro Abs: 3.8 10*3/uL (ref 1.7–7.7)
Neutrophils Relative %: 69 %
Platelet Count: 210 10*3/uL (ref 150–400)
RBC: 4.96 MIL/uL (ref 4.22–5.81)
RDW: 14.6 % (ref 11.5–15.5)
WBC Count: 5.5 10*3/uL (ref 4.0–10.5)
nRBC: 0 % (ref 0.0–0.2)

## 2020-12-07 LAB — CMP (CANCER CENTER ONLY)
ALT: 23 U/L (ref 0–44)
AST: 24 U/L (ref 15–41)
Albumin: 4.3 g/dL (ref 3.5–5.0)
Alkaline Phosphatase: 79 U/L (ref 38–126)
Anion gap: 8 (ref 5–15)
BUN: 18 mg/dL (ref 8–23)
CO2: 27 mmol/L (ref 22–32)
Calcium: 10.3 mg/dL (ref 8.9–10.3)
Chloride: 100 mmol/L (ref 98–111)
Creatinine: 1.02 mg/dL (ref 0.61–1.24)
GFR, Estimated: >60 mL/min (ref >60)
Glucose, Bld: 99 mg/dL (ref 70–99)
Potassium: 3.4 mmol/L — ABNORMAL LOW (ref 3.5–5.1)
Sodium: 135 mmol/L (ref 135–145)
Total Bilirubin: 0.9 mg/dL (ref 0.3–1.2)
Total Protein: 7.9 g/dL (ref 6.5–8.1)

## 2020-12-07 LAB — TSH: TSH: 1.472 u[IU]/mL (ref 0.350–4.500)

## 2020-12-07 NOTE — Progress Notes (Signed)
DISCONTINUE ON PATHWAY REGIMEN - Small Cell Lung     A cycle is every 21 days:     Etoposide      Cisplatin   **Always confirm dose/schedule in your pharmacy ordering system**  REASON: Disease Progression PRIOR TREATMENT: LOS15: Cisplatin 75 mg/m2 D1 + Etoposide 100 mg/m2 IV D1-3 q21 Days x 4 Cycles with Concurrent Radiation TREATMENT RESPONSE: Partial Response (PR)  START ON PATHWAY REGIMEN - Small Cell Lung     Cycles 1 through 4: A cycle is every 21 days:     Durvalumab      Carboplatin      Etoposide    Cycles 5 and beyond: A cycle is every 28 days:     Durvalumab   **Always confirm dose/schedule in your pharmacy ordering system**  Patient Characteristics: Relapsed or Progressive Disease, Second Line, Relapse > 6 Months Therapeutic Status: Relapsed or Progressive Disease Line of Therapy: Second Line Time to Relapse: Relapse > 6 Months Intent of Therapy: Non-Curative / Palliative Intent, Discussed with Patient

## 2020-12-07 NOTE — Progress Notes (Signed)
[place] [phone]   [phone]  OFFICE PROGRESS NOTE  de [place], [name], MD [address] [place] [address]  DIAGNOSIS: Limited stage (T1c, N2, M0) small cell lung cancer diagnosed in February 2022.  PRIOR THERAPY: Systemic chemotherapy according to phase 3 randomized clinical trial of chemoradiation versus chemoradiation plus atezolizumab status post 3 cycles.  This treatment was discontinued secondary to intolerance.  CURRENT THERAPY: Second line systemic chemotherapy with carboplatin for AUC of 5 on day 1, etoposide 100 Mg/M2 on days 1, 2 and 3 as well as Imfinzi 1500 Mg IV on day 1 and Cosela 240 Mg/M2 before chemotherapy on days 1, 2 and 3 every 3 weeks.  First dose December 13, 2020.  INTERVAL HISTORY: Adam [name] 65 y.o. male returns to the clinic today for follow-up visit.  The patient is feeling fine today with no concerning complaints except for the pain in the left hip area.  He was referred to see Dr. Tammi Klippel for consideration of palliative radiotherapy but he has not received the appointment yet.  He denied having any current chest pain, shortness of breath, cough or hemoptysis.  He denied having any fever or chills.  He has no nausea, vomiting, diarrhea or constipation.  He has no headache or visual changes.  He was supposed to have a PET scan before this visit but unfortunately his insurance declined the coverage of the PET scan.  He is here today for evaluation and discussion of his treatment options.  MEDICAL HISTORY: Past Medical History:  Diagnosis Date   Hypertension    Lung cancer ([place])    small cell RUL    ALLERGIES:  has No Known Allergies.  MEDICATIONS:  Current Outpatient Medications  Medication Sig Dispense Refill   amLODipine (NORVASC) 10 MG tablet Take 10 mg by mouth daily.     ibuprofen (ADVIL) 600 MG tablet Take 600 mg by mouth every 6 (six) hours as needed.     lisinopril-hydrochlorothiazide  (ZESTORETIC) 20-12.5 MG tablet Take 1 tablet by mouth daily. 90 tablet 1   No current facility-administered medications for this visit.    SURGICAL HISTORY:  Past Surgical History:  Procedure Laterality Date   BRONCHIAL BRUSHINGS  02/08/2020   Procedure: BRONCHIAL BRUSHINGS;  Surgeon: Freddi Starr, MD;  Location: [facility];  Service: Pulmonary;;   BRONCHIAL NEEDLE ASPIRATION BIOPSY  02/08/2020   Procedure: BRONCHIAL NEEDLE ASPIRATION BIOPSIES;  Surgeon: Freddi Starr, MD;  Location: [facility];  Service: Pulmonary;;   KNEE SURGERY Right    SHOULDER SURGERY Left    VIDEO BRONCHOSCOPY WITH ENDOBRONCHIAL ULTRASOUND N/A 02/08/2020   Procedure: VIDEO BRONCHOSCOPY WITH ENDOBRONCHIAL ULTRASOUND;  Surgeon: Freddi Starr, MD;  Location: [facility];  Service: Pulmonary;  Laterality: N/A;    REVIEW OF SYSTEMS:  Constitutional: positive for fatigue Eyes: negative Ears, nose, mouth, throat, and face: negative Respiratory: negative Cardiovascular: negative Gastrointestinal: negative Genitourinary:negative Integument/breast: negative Hematologic/lymphatic: negative Musculoskeletal:positive for bone pain Neurological: negative Behavioral/Psych: negative Endocrine: negative Allergic/Immunologic: negative   PHYSICAL EXAMINATION: General appearance: alert, cooperative, fatigued, and no distress Head: Normocephalic, without obvious abnormality, atraumatic Neck: no adenopathy, no JVD, supple, symmetrical, trachea midline, and thyroid not enlarged, symmetric, no tenderness/mass/nodules Lymph nodes: Cervical, supraclavicular, and axillary nodes normal. Resp: clear to auscultation bilaterally Back: symmetric, no curvature. ROM normal. No CVA tenderness. Cardio: regular rate and rhythm, S1, S2 normal, no murmur, click, rub or gallop GI: soft, non-tender; bowel sounds normal; no masses,  no organomegaly Extremities: extremities normal, atraumatic, no cyanosis or edema Neurologic:  Alert and oriented X 3, normal strength and tone. Normal symmetric reflexes. Normal coordination and gait  ECOG PERFORMANCE STATUS: 1 - Symptomatic but completely ambulatory  Blood pressure (!) 129/92, pulse 94, temperature (!) 96.9 F (36.1 C), temperature source Tympanic, resp. rate 19, height 5\' 9"  (1.753 m), weight 187 lb 4.8 oz (85 kg), SpO2 100 %.  LABORATORY DATA: Lab Results  Component Value Date   WBC 5.5 12/07/2020   HGB 13.1 12/07/2020   HCT 40.3 12/07/2020   MCV 81.3 12/07/2020   PLT 210 12/07/2020      Chemistry      Component Value Date/Time   NA 138 11/22/2020 1129   K 3.7 11/22/2020 1129   CL 101 11/22/2020 1129   CO2 27 11/22/2020 1129   BUN 17 11/22/2020 1129   CREATININE 1.18 11/22/2020 1129      Component Value Date/Time   CALCIUM 10.2 11/22/2020 1129   ALKPHOS 76 11/22/2020 1129   AST 24 11/22/2020 1129   ALT 22 11/22/2020 1129   BILITOT 0.4 11/22/2020 1129       RADIOGRAPHIC STUDIES: MR Brain W Wo Contrast  Result Date: 11/18/2020 CLINICAL DATA:  Small cell lung cancer surveillance EXAM: MRI HEAD WITHOUT AND WITH CONTRAST TECHNIQUE: Multiplanar, multiecho pulse sequences of the brain and surrounding structures were obtained without and with intravenous contrast. CONTRAST:  10mL GADAVIST GADOBUTROL 1 MMOL/ML IV SOLN COMPARISON:  MR head 09/15/2020. FINDINGS: Brain: There is no acute infarction or intracranial hemorrhage. There is no intracranial mass, mass effect, or edema. There is no hydrocephalus or extra-axial fluid collection. 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 Additional patchy and confluent T2 hyperintensity in the supratentorial white matter is nonspecific but may reflect mild to moderate chronic microvascular ischemic changes. 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 No new abnormal enhancement. Vascular: Major vessel flow voids at the skull base are preserved. Skull and upper cervical spine: Normal marrow signal is preserved. Sinuses/Orbits: Paranasal sinuses are aerated. Orbits are unremarkable. Other: Sella is unremarkable.  Mastoid air cells are clear. IMPRESSION: No evidence of intracranial metastatic disease. Electronically Signed   By: Macy Mis M.D.   On: 11/18/2020 14:05   CT CHEST ABDOMEN PELVIS W CONTRAST  Result Date: 11/16/2020 CLINICAL DATA:  65 year old male with history of small cell lung cancer in the right upper lobe status post chemotherapy and radiation therapy (radiation therapy to the lung completed in April 2022). Follow-up study. EXAM: CT CHEST, ABDOMEN, AND PELVIS WITH CONTRAST TECHNIQUE: Multidetector CT imaging of the chest, abdomen and pelvis was performed following the standard protocol during bolus administration of intravenous contrast. CONTRAST:  26mL OMNIPAQUE IOHEXOL 350 MG/ML SOLN COMPARISON:  CT the chest, abdomen and pelvis 08/09/2020. FINDINGS: RECIST 1.1 Target Lesions: 1. Nodular area of architectural distortion in the apex of the 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axial image 34 of series 6), stable measuring 1.7 x 1.4 cm (previously 1.8 x 1.5 cm). 2. Amorphous soft tissue mass in the low right paratracheal nodal station extending into the right hilar region (axial image 23 of series 2) measuring 4.3 x 2.5 cm, slightly decreased compared to the prior study, previously 4.8 x 2.8 cm. 3. New target lesion in the central aspect of the right lung associated with the major fissure (axial image  59 of series 6) measuring 3.0 x 2.1 cm. 4. New target lesion in the middle mediastinum (axial image 30 of series 2) measuring 3.3 x 2.5 cm, presumably an enlarged lymph node, although the possibility of a primary lesion associated with the mid esophagus is not excluded. 5. New pleural based nodule in the periphery of the right hemithorax (axial  image 25 of series 2) measuring 2.8 x 1.9 cm. CT CHEST FINDINGS Cardiovascular: Heart size is normal. There is no significant pericardial fluid, thickening or pericardial calcification. There is aortic atherosclerosis, as well as atherosclerosis of the great vessels of the mediastinum and the coronary arteries, including calcified atherosclerotic plaque in the left main, left anterior descending, left circumflex and right coronary arteries. Mediastinum/Nodes: Amorphous soft tissue mass in the low right paratracheal nodal station extending into the right hilar region (axial image 23 of series 2) measuring 4.3 x 2.5 cm, slightly decreased compared to the prior study, previously 4.8 x 2.8 cm. In addition, there is a new lesion in the middle mediastinum (axial image 30 of series 2) measuring 3.3 x 2.5 cm, presumably an enlarged lymph node, although the possibility of a primary lesion associated with the mid esophagus is not excluded. No axillary lymphadenopathy. Lungs/Pleura: Nodular area of architectural distortion in the apex of the 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axial image 34 of series 6), stable measuring 1.7 x 1.4 cm (previously 1.8 x 1.5 cm). 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 In the superior aspect of the right major fissure there is a new lesion lesion in the central aspect of the right lung (axial image 59 of series 6) measuring 3.0 x 2.1 cm. Several surrounding pulmonary nodules are also noted associated with adjacent portions of the right major and minor fissures. Several pleural based nodules are noted in the right hemithorax, the largest of which is in the periphery of the right hemithorax (axial image 25 of series 2) measuring 2.8 x 1.9 cm. Enlarging trace right pleural effusion, likely malignant. Diffuse bronchial wall thickening with moderate  centrilobular and paraseptal emphysema. Musculoskeletal: There are no aggressive appearing lytic or blastic lesions noted in the visualized portions of the skeleton. CT ABDOMEN PELVIS FINDINGS Hepatobiliary: 8 mm low-attenuation lesion in segment 2 of the liver, too small to characterize, but stable compared to prior studies and statistically likely to represent a cyst. 3 mm lesion in segment 4 B also stable, also likely a tiny cyst but too small to characterize. No new suspicious appearing hepatic lesions. No intra or extrahepatic biliary ductal dilatation. Gallbladder is normal in appearance. 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 No pancreatic ductal dilatation. No peripancreatic fluid collections or inflammatory changes. Spleen: Unremarkable.  Small splenules inferior to the spleen. Adrenals/Urinary Tract: 8 mm nonobstructive calculus in the lower pole collecting system of left kidney. Subcentimeter low-attenuation lesion in the posterior aspect of the interpolar region of the left kidney too small to characterize, but statistically likely to represent a tiny cyst. Right kidney and bilateral adrenal glands are normal in appearance. No hydroureteronephrosis. Urinary bladder is nearly decompressed, but otherwise unremarkable in appearance. Stomach/Bowel: The appearance of the  stomach is normal. No pathologic dilatation of small bowel or colon. A few scattered colonic diverticulae are noted, without surrounding inflammatory changes to suggest an acute diverticulitis at this time. Normal appendix. Vascular/Lymphatic: Aortic atherosclerosis, without evidence of aneurysm or dissection in the abdominal or pelvic vasculature. No lymphadenopathy noted in the abdomen or pelvis.  Reproductive: Prostate gland and seminal vesicles are unremarkable in appearance. Other: No significant volume of ascites.  No pneumoperitoneum. Musculoskeletal: New lytic lesion in the parasymphyseal region of the left pubic bone (axial image 114 of series 2), likely metastatic. IMPRESSION: 1. Definitive progression of disease as evidenced by new mass centered in the superior aspect of the right major fissure, widespread nodularity in the adjacent portion of the upper right lung, multiple new pleural based lesions in the right hemithorax, new middle mediastinal lymphadenopathy, new hypovascular lesion in the proximal body of the pancreas, and new lytic lesion in the parasymphyseal region of the left pubic bone, as detailed above. 2. Diffuse bronchial wall thickening with moderate centrilobular and paraseptal emphysema; imaging findings suggestive of underlying COPD. 3. Aortic atherosclerosis, in addition to left main and 3 vessel coronary artery disease. Please note that although the presence of coronary artery calcium documents the presence of coronary artery disease, the severity of this disease and any potential stenosis cannot be assessed on this non-gated CT examination. Assessment for potential risk factor modification, dietary therapy or pharmacologic therapy may be warranted, if clinically indicated. 4. Additional incidental findings, as above. These results will be called to the ordering clinician or representative by the Radiologist Assistant, and communication documented in the PACS or [employer]. Electronically Signed   By: Vinnie Langton M.D.   On: 11/16/2020 10:45    ASSESSMENT AND PLAN: This is a very pleasant 65 years old African-American male recently diagnosed with limited stage small cell lung cancer in February 2022.  The patient is currently undergoing treatment according to the [place] clinical trial where the patient would be randomized to treatment with concurrent chemoradiation  with cisplatin and/or etoposide versus concurrent chemoradiation with the same regimen in addition to atezolizumab. He is status post 3 cycles of the treatment.  His treatment was discontinued secondary to intolerance and the patient has been in observation since April 2022. He had MRI of the brain performed recently that showed no evidence of metastatic disease to the brain. Repeat CT scan of the chest, abdomen and pelvis unfortunately showed evidence for disease progression involving new mass centered in the superior aspect of the right major fissure, widespread nodularity in the adjacent portion of the upper right lung, multiple new pleural-based lesions in the right hemithorax, new middle mediastinal lymphadenopathy, new hypervascular lesion in the proximal body of the pancreas and new lytic lesions in the parasymphyseal region of the left to pubic bone. His insurance declined a PET scan. I had a lengthy discussion with the patient today about his current condition and treatment options. I gave the patient the option of palliative care versus palliative systemic chemotherapy with carboplatin for AUC of 5 on day 1, etoposide 100 Mg/M2 on days 1, 2 and 3 with Cosela 4 Myeloprotection before chemotherapy and Imfinzi 1500 Mg IV every 3 weeks with the induction phase followed by maintenance Imfinzi every 4 weeks if the patient has no evidence for disease progression. The patient is interested in treatment. I discussed with him the adverse effect of this treatment including but not limited to alopecia, myelosuppression, nausea and vomiting, peripheral neuropathy, liver or renal  dysfunction as well as immunotherapy adverse effects. He is expected to start the first cycle of this treatment next week. Regarding the hip pain, we will refer the patient to Dr. Tammi Klippel again for evaluation of his condition and consideration of palliative radiotherapy to this area. The patient will come back for follow-up visit in  2 weeks for evaluation and management of any adverse effect of his treatment. He was advised to call immediately if he has any other concerning symptoms in the interval. The patient voices understanding of current disease status and treatment options and is in agreement with the current care plan.  All questions were answered. The patient knows to call the clinic with any problems, questions or concerns. We can certainly see the patient much sooner if necessary.  The total time spent in the appointment was 30 minutes.  Disclaimer: This note was dictated with voice recognition software. Similar sounding words can inadvertently be transcribed and may not be corrected upon review.

## 2020-12-08 ENCOUNTER — Ambulatory Visit
Admission: RE | Admit: 2020-12-08 | Discharge: 2020-12-08 | Disposition: A | Discharge status: Home / Self Care | Payer: 59 | Source: Ambulatory Visit | Attending: Radiation Oncology | Admitting: Radiation Oncology

## 2020-12-08 ENCOUNTER — Encounter: Payer: Self-pay | Admitting: Urology

## 2020-12-08 ENCOUNTER — Ambulatory Visit
Admission: RE | Admit: 2020-12-08 | Discharge: 2020-12-08 | Disposition: A | Discharge status: Home / Self Care | Payer: 59 | Source: Ambulatory Visit | Attending: Urology | Admitting: Urology

## 2020-12-08 VITALS — BP 102/80 | HR 103 | Temp 96.9°F | Resp 18 | Ht 69.0 in | Wt 185.2 lb

## 2020-12-08 DIAGNOSIS — I10 Essential (primary) hypertension: Secondary | ICD-10-CM | POA: Insufficient documentation

## 2020-12-08 DIAGNOSIS — C3491 Malignant neoplasm of unspecified part of right bronchus or lung: Secondary | ICD-10-CM | POA: Insufficient documentation

## 2020-12-08 DIAGNOSIS — F17210 Nicotine dependence, cigarettes, uncomplicated: Secondary | ICD-10-CM | POA: Insufficient documentation

## 2020-12-08 DIAGNOSIS — Z510 Encounter for antineoplastic radiation therapy: Secondary | ICD-10-CM | POA: Insufficient documentation

## 2020-12-08 DIAGNOSIS — C7951 Secondary malignant neoplasm of bone: Secondary | ICD-10-CM

## 2020-12-08 DIAGNOSIS — Z79899 Other long term (current) drug therapy: Secondary | ICD-10-CM | POA: Insufficient documentation

## 2020-12-08 DIAGNOSIS — C3411 Malignant neoplasm of upper lobe, right bronchus or lung: Secondary | ICD-10-CM | POA: Insufficient documentation

## 2020-12-08 DIAGNOSIS — C3490 Malignant neoplasm of unspecified part of unspecified bronchus or lung: Secondary | ICD-10-CM

## 2020-12-08 NOTE — Progress Notes (Signed)
Radiation Oncology         [phone] ________________________________  Outpatient Re-Consultation  Name: Adam [name] MRN: [id number]  Date of Service: 12/08/2020 DOB: June 24, 1955  CC:de [place], Blondell Reveal, MD  Curt Bears, MD   REFERRING PHYSICIAN: Curt Bears, MD  DIAGNOSIS: 65 y/o male with a painful bony lesion in the left pubic bone secondary to metastatic small cell lung cancer.    ICD-10-CM   1. Metastatic lung cancer (metastasis from lung to other site), right [facility])  C34.91       HISTORY OF PRESENT ILLNESS: Adam [name] is a 65 y.o. male seen at the request of Dr. Julien Nordmann. He is well known to our service having previously completed chemoradiation to the limited stage small cell lung cancer in 04/2020 followed by prophylactic cranial irradiation (PCI) in June 2022.  His initial disease restaging scans in May 2022 and August 2022 showed an excellent response to treatment and the patient had remained on observation only since April 2022.  Unfortunately, on his most recent restaging CT C/A/P performed on 11/15/2020, he was noted to have significant disease progression in the chest as well as a new lytic lesion in the left pubic bone.  And Dr. Rob Hickman absence, he was seen in follow-up with Dr. Earlie Server and reported feeling well in general aside from pain in the left hip area.  He is planning to proceed with second line systemic therapy on 12/13/2020 but has been referred to [place] today for evaluation and discussion of the potential for palliative radiotherapy to the painful lesion in the left pubic bone.  He reports the pain to be just lateral to the groin on the left anterior upper thigh and denies any radiation into the left lower extremity or paresthesias.  The pain is exacerbated with weightbearing and particularly when trying to jog/run for exercise.  The pain does not wake him from sleep at night it does improve with rest.  He has not had any recent trauma or injury to his knowledge.   He has been using 1 Advil in the morning and this seems to help control his pain.  PREVIOUS RADIATION THERAPY: Yes  06/27/20 - 07/08/20//PCI: The whole brain was treated to 25 Gy in 10 fractions of 2.5 Gy   03/15/20 - 04/28/20:  The primary tumor and involved mediastinal adenopathy were treated to 66 Gy in 33 fractions of 2 Gy; concurrent with chemotherapy.  PAST MEDICAL HISTORY:  Past Medical History:  Diagnosis Date   Hypertension    Lung cancer ([place])    small cell RUL      PAST SURGICAL HISTORY: Past Surgical History:  Procedure Laterality Date   BRONCHIAL BRUSHINGS  02/08/2020   Procedure: BRONCHIAL BRUSHINGS;  Surgeon: Freddi Starr, MD;  Location: [place];  Service: Pulmonary;;   BRONCHIAL NEEDLE ASPIRATION BIOPSY  02/08/2020   Procedure: BRONCHIAL NEEDLE ASPIRATION BIOPSIES;  Surgeon: Freddi Starr, MD;  Location: [facility];  Service: Pulmonary;;   KNEE SURGERY Right    SHOULDER SURGERY Left    VIDEO BRONCHOSCOPY WITH ENDOBRONCHIAL ULTRASOUND N/A 02/08/2020   Procedure: VIDEO BRONCHOSCOPY WITH ENDOBRONCHIAL ULTRASOUND;  Surgeon: Freddi Starr, MD;  Location: [facility];  Service: Pulmonary;  Laterality: N/A;    FAMILY HISTORY:  Family History  Problem Relation Age of Onset   Heart disease Mother    Cancer Cousin    Breast cancer Neg Hx    Colon cancer Neg Hx    Pancreatic cancer Neg Hx  Prostate cancer Neg Hx     SOCIAL HISTORY:  Social History   Socioeconomic History   Marital status: Divorced    Spouse name: Development worker, community   Number of children: Not on file   Years of education: Not on file   Highest education level: Not on file  Occupational History   Not on file  Tobacco Use   Smoking status: Former    Packs/day: 1.00    Years: 20.00    Pack years: 20.00    Types: Cigarettes    Quit date: 06/09/2018    Years since quitting: 2.5   Smokeless tobacco: Never  Vaping Use   Vaping Use: Never used  Substance and Sexual Activity   Alcohol use:  Not Currently   Drug use: Never   Sexual activity: Yes  Other Topics Concern   Not on file  Social History Narrative   Not on file   Social Determinants of Health   Financial Resource Strain: Not on file  Food Insecurity: Not on file  Transportation Needs: Not on file  Physical Activity: Not on file  Stress: Not on file  Social Connections: Not on file  Intimate Partner Violence: Not on file    ALLERGIES: Patient has no known allergies.  MEDICATIONS:  Current Outpatient Medications  Medication Sig Dispense Refill   amLODipine (NORVASC) 10 MG tablet Take 10 mg by mouth daily.     bisacodyl (DULCOLAX) 5 MG EC tablet Take 5 mg by mouth daily as needed for moderate constipation.     ibuprofen (ADVIL) 600 MG tablet Take 600 mg by mouth every 6 (six) hours as needed.     lisinopril-hydrochlorothiazide (ZESTORETIC) 20-12.5 MG tablet Take 1 tablet by mouth daily. 90 tablet 1   No current facility-administered medications for this encounter.    REVIEW OF SYSTEMS:  On review of systems, the patient reports that he is doing well overall.  He denies any chest pain, shortness of breath, cough, fevers, chills, night sweats, unintended weight changes.  He denies any bowel or bladder disturbances, and denies abdominal pain, nausea or vomiting. He denies any new musculoskeletal or joint aches or pains aside from that noted in the left hip area. A complete review of systems is obtained and is otherwise negative.    PHYSICAL EXAM:  Wt Readings from Last 3 Encounters:  12/08/20 185 lb 4 oz (84 kg)  12/07/20 187 lb 4.8 oz (85 kg)  11/14/20 189 lb (85.7 kg)   Temp Readings from Last 3 Encounters:  12/08/20 (!) 96.9 F (36.1 C) (Temporal)  12/07/20 (!) 96.9 F (36.1 C) (Tympanic)  11/22/20 97.7 F (36.5 C) (Tympanic)   BP Readings from Last 3 Encounters:  12/08/20 102/80  12/07/20 (!) 129/92  11/22/20 123/90   Pulse Readings from Last 3 Encounters:  12/08/20 (!) 103  12/07/20 94   11/22/20 96    /10  In general this is a well appearing African American male in no acute distress. He is alert and oriented x4 and appropriate throughout the examination. HEENT reveals that the patient is normocephalic, atraumatic. EOMs are intact. PERRLA. Skin is intact without any evidence of gross lesions. Cardiopulmonary assessment is negative for acute distress and he exhibits normal effort. The abdomen is soft, non tender, non distended. Lower extremities are negative for pretibial pitting edema, deep calf tenderness, cyanosis or clubbing.   KPS = 90  100 - Normal; no complaints; no evidence of disease. 90   - Able to carry on normal  activity; minor signs or symptoms of disease. 80   - Normal activity with effort; some signs or symptoms of disease. 37   - Cares for self; unable to carry on normal activity or to do active work. 60   - Requires occasional assistance, but is able to care for most of his personal needs. 50   - Requires considerable assistance and frequent medical care. 51   - Disabled; requires special care and assistance. 34   - Severely disabled; hospital admission is indicated although death not imminent. 47   - Very sick; hospital admission necessary; active supportive treatment necessary. 10   - Moribund; fatal processes progressing rapidly. 0     - Dead  Karnofsky DA, Abelmann [place], Craver LS and Burchenal [facility] [phone]) The use of the nitrogen mustards in the palliative treatment of carcinoma: with particular reference to bronchogenic carcinoma Cancer 1 634-56  LABORATORY DATA:  Lab Results  Component Value Date   WBC 5.5 12/07/2020   HGB 13.1 12/07/2020   HCT 40.3 12/07/2020   MCV 81.3 12/07/2020   PLT 210 12/07/2020   Lab Results  Component Value Date   NA 135 12/07/2020   K 3.4 (L) 12/07/2020   CL 100 12/07/2020   CO2 27 12/07/2020   Lab Results  Component Value Date   ALT 23 12/07/2020   AST 24 12/07/2020   ALKPHOS 79 12/07/2020   BILITOT 0.9  12/07/2020     RADIOGRAPHY: MR Brain W Wo Contrast  Result Date: 11/18/2020 CLINICAL DATA:  Small cell lung cancer surveillance EXAM: MRI HEAD WITHOUT AND WITH CONTRAST TECHNIQUE: Multiplanar, multiecho pulse sequences of the brain and surrounding structures were obtained without and with intravenous contrast. CONTRAST:  61mL GADAVIST GADOBUTROL 1 MMOL/ML IV SOLN COMPARISON:  MR head 09/15/2020. FINDINGS: Brain: There is no acute infarction or intracranial hemorrhage. There is no intracranial mass, mass effect, or edema. There is no hydrocephalus or extra-axial fluid collection. 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 Additional patchy and confluent T2 hyperintensity in the supratentorial white matter is nonspecific but may reflect mild to moderate chronic microvascular ischemic changes. 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 No new abnormal enhancement. Vascular: Major vessel flow voids at the skull base are preserved. Skull and upper cervical spine: Normal marrow signal is preserved. Sinuses/Orbits: Paranasal sinuses are aerated. Orbits are unremarkable. Other: Sella is unremarkable.  Mastoid air cells are clear. IMPRESSION: No evidence of intracranial metastatic disease. Electronically Signed   By: Macy Mis M.D.   On: 11/18/2020 14:05   CT CHEST ABDOMEN PELVIS W CONTRAST  Result Date: 11/16/2020 CLINICAL DATA:  65 year old male with history of small cell lung cancer in the right upper lobe status post chemotherapy and radiation therapy (radiation therapy to the lung completed in April 2022). Follow-up study. EXAM: CT CHEST, ABDOMEN, AND PELVIS WITH CONTRAST TECHNIQUE: Multidetector CT imaging of the chest, abdomen and pelvis was performed following the standard protocol during bolus administration of  intravenous contrast. CONTRAST:  24mL OMNIPAQUE IOHEXOL 350 MG/ML SOLN COMPARISON:  CT the chest, abdomen and pelvis 08/09/2020. FINDINGS: RECIST 1.1 Target Lesions: 1. Nodular area of architectural distortion in the apex of the 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axial image 34 of series 6), stable measuring 1.7 x 1.4 cm (previously 1.8 x 1.5 cm). 2. Amorphous soft tissue mass in the low right paratracheal nodal  station extending into the right hilar region (axial image 23 of series 2) measuring 4.3 x 2.5 cm, slightly decreased compared to the prior study, previously 4.8 x 2.8 cm. 3. New target lesion in the central aspect of the right lung associated with the major fissure (axial image 59 of series 6) measuring 3.0 x 2.1 cm. 4. New target lesion in the middle mediastinum (axial image 30 of series 2) measuring 3.3 x 2.5 cm, presumably an enlarged lymph node, although the possibility of a primary lesion associated with the mid esophagus is not excluded. 5. New pleural based nodule in the periphery of the right hemithorax (axial image 25 of series 2) measuring 2.8 x 1.9 cm. CT CHEST FINDINGS Cardiovascular: Heart size is normal. There is no significant pericardial fluid, thickening or pericardial calcification. There is aortic atherosclerosis, as well as atherosclerosis of the great vessels of the mediastinum and the coronary arteries, including calcified atherosclerotic plaque in the left main, left anterior descending, left circumflex and right coronary arteries. Mediastinum/Nodes: Amorphous soft tissue mass in the low right paratracheal nodal station extending into the right hilar region (axial image 23 of series 2) measuring 4.3 x 2.5 cm, slightly decreased compared to the prior study, previously 4.8 x 2.8 cm. In addition, there is a new lesion in the middle mediastinum (axial image 30 of series 2) measuring 3.3 x 2.5 cm, presumably an enlarged lymph node, although the possibility of a primary lesion associated with the mid  esophagus is not excluded. No axillary lymphadenopathy. Lungs/Pleura: Nodular area of architectural distortion in the apex of the 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axial image 34 of series 6), stable measuring 1.7 x 1.4 cm (previously 1.8 x 1.5 cm). 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 In the superior aspect of the right major fissure there is a new lesion lesion in the central aspect of the right lung (axial image 59 of series 6) measuring 3.0 x 2.1 cm. Several surrounding pulmonary nodules are also noted associated with adjacent portions of the right major and minor fissures. Several pleural based nodules are noted in the right hemithorax, the largest of which is in the periphery of the right hemithorax (axial image 25 of series 2) measuring 2.8 x 1.9 cm. Enlarging trace right pleural effusion, likely malignant. Diffuse bronchial wall thickening with moderate centrilobular and paraseptal emphysema. Musculoskeletal: There are no aggressive appearing lytic or blastic lesions noted in the visualized portions of the skeleton. CT ABDOMEN PELVIS FINDINGS Hepatobiliary: 8 mm low-attenuation lesion in segment 2 of the liver, too small to characterize, but stable compared to prior studies and statistically likely to represent a cyst. 3 mm lesion in segment 4 B also stable, also likely a tiny cyst but too small to characterize. No new suspicious appearing hepatic lesions. No intra or extrahepatic biliary ductal dilatation. Gallbladder is normal in appearance. 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 No pancreatic ductal dilatation. No  peripancreatic fluid collections or inflammatory changes. Spleen: Unremarkable.  Small splenules inferior to the spleen. Adrenals/Urinary Tract: 8 mm nonobstructive calculus in the lower pole collecting system of left kidney. Subcentimeter low-attenuation lesion in the  posterior aspect of the interpolar region of the left kidney too small to characterize, but statistically likely to represent a tiny cyst. Right kidney and bilateral adrenal glands are normal in appearance. No hydroureteronephrosis. Urinary bladder is nearly decompressed, but otherwise unremarkable in appearance. Stomach/Bowel: The appearance of the stomach is normal. No pathologic dilatation of small bowel or colon. A few scattered colonic diverticulae are noted, without surrounding inflammatory changes to suggest an acute diverticulitis at this time. Normal appendix. Vascular/Lymphatic: Aortic atherosclerosis, without evidence of aneurysm or dissection in the abdominal or pelvic vasculature. No lymphadenopathy noted in the abdomen or pelvis. Reproductive: Prostate gland and seminal vesicles are unremarkable in appearance. Other: No significant volume of ascites.  No pneumoperitoneum. Musculoskeletal: New lytic lesion in the parasymphyseal region of the left pubic bone (axial image 114 of series 2), likely metastatic. IMPRESSION: 1. Definitive progression of disease as evidenced by new mass centered in the superior aspect of the right major fissure, widespread nodularity in the adjacent portion of the upper right lung, multiple new pleural based lesions in the right hemithorax, new middle mediastinal lymphadenopathy, new hypovascular lesion in the proximal body of the pancreas, and new lytic lesion in the parasymphyseal region of the left pubic bone, as detailed above. 2. Diffuse bronchial wall thickening with moderate centrilobular and paraseptal emphysema; imaging findings suggestive of underlying COPD. 3. Aortic atherosclerosis, in addition to  left main and 3 vessel coronary artery disease. Please note that although the presence of coronary artery calcium documents the presence of coronary artery disease, the severity of this disease and any potential stenosis cannot be assessed on this non-gated CT examination. Assessment for potential risk factor modification, dietary therapy or pharmacologic therapy may be warranted, if clinically indicated. 4. Additional incidental findings, as above. These results will be called to the ordering clinician or representative by the Radiologist Assistant, and communication documented in the PACS or [employer]. Electronically Signed   By: Vinnie Langton M.D.   On: 11/16/2020 10:45      IMPRESSION/PLAN: 1. 65 y.o.  male with a painful bony lesion in the left pubic bone secondary to metastatic small cell lung cancer. Today, we talked to the patient about the findings and workup thus far. We discussed the natural history of extensive stage small cell lung cancer and general treatment, highlighting the role of palliative radiotherapy in the management of painful bony lesions. We discussed the available radiation techniques, and focused on the details and logistics of delivery.  The recommendation is for a 3-week course of daily, palliative radiotherapy to the painful lesion in the left pubic bone.  We reviewed the anticipated acute and late sequelae associated with radiation in this setting. The patient was encouraged to ask questions that were answered to his stated satisfaction.   At the conclusion of our conversation, the patient elects to proceed with the recommended 3-week course of palliative radiotherapy to the left pubic lesion.  He has freely signed written consent to proceed today in the office and a copy of this document will be placed in his medical record.  He will proceed with CT simulation following our visit today in anticipation of beginning his treatments on Monday, 12/12/2020.  He appears to  have a good understanding of his disease and our recommendations and is comfortable and in agreement with the stated plan.  Therefore, we will share this discussion with Dr. Earlie Server and move forward with treatment planning accordingly.   We personally spent 45 minutes in this encounter  including chart review, reviewing radiological studies, meeting face-to-face with the patient, entering orders and completing documentation.    Nicholos Johns, PA-C    Tyler Pita, MD  [facility] Direct Dial: [phone]  Fax: [phone] [email or web address]  Skype  [email or web address]

## 2020-12-08 NOTE — Addendum Note (Signed)
Encounter addended by: Tyler Pita, MD on: 12/08/2020 4:20 PM  Actions taken: Medication List reviewed, Problem List reviewed, Allergies reviewed, Problem List modified, Clinical Note Signed, Visit diagnoses modified

## 2020-12-08 NOTE — Progress Notes (Signed)
Patient reports LT hip pain 6/10, w/o walking difficulty. No other symptoms reported at this time.  Meaningful use complete.  BP 102/80 (BP Location: Left Arm, Patient Position: Sitting, Cuff Size: Normal)   Pulse (!) 103   Temp (!) 96.9 F (36.1 C) (Temporal)   Resp 18   Ht 5\' 9"  (1.753 m)   Wt 185 lb 4 oz (84 kg)   SpO2 100%   BMI 27.36 kg/m

## 2020-12-08 NOTE — Progress Notes (Signed)
  Radiation Oncology         [phone] ________________________________  Name: Adam [name] MRN: [id number]  Date: 12/08/2020  DOB: July 24, 1955  SIMULATION AND TREATMENT PLANNING NOTE    ICD-10-CM   1. Bone metastasis ([place])  C79.51     2. Metastatic lung cancer (metastasis from lung to other site), right [facility])  C34.91       DIAGNOSIS:  65 yo man with a painful bony lesion in the left pubic bone secondary to metastatic small cell lung cancer of the right upper lung,  NARRATIVE:  The patient was brought to the [place].  Identity was confirmed.  All relevant records and images related to the planned course of therapy were reviewed.  The patient freely provided informed written consent to proceed with treatment after reviewing the details related to the planned course of therapy. The consent form was witnessed and verified by the simulation staff.  Then, the patient was set-up in a stable reproducible  supine position for radiation therapy.  CT images were obtained.  Surface markings were placed.  The CT images were loaded into the planning software.  Then the target and avoidance structures were contoured.  Treatment planning then occurred.  The radiation prescription was entered and confirmed.  Then, I designed and supervised the construction of a total of 3 medically necessary complex treatment devices consisting of leg positioner and MLC apertures to cover the treated hip area.  I have requested : 3D Simulation  I have requested a DVH of the following structures: Rectum, Bladder, femoral heads and target.  SPECIAL TREATMENT PROCEDURE:  The planned course of therapy using radiation constitutes a special treatment procedure. Special care is required in the management of this patient for the following reasons. This treatment constitutes a Special Treatment Procedure for the following reason: [ Concurrent chemotherapy requiring careful monitoring for increased toxicities of  treatment including weekly laboratory values..  The special nature of the planned course of radiotherapy will require increased physician supervision and oversight to ensure patient's safety with optimal treatment outcomes.  This will require extended time and effort from me.  PLAN:  The patient will receive 35 Gy in 14 fractions.  ________________________________  Sheral Apley Tammi Klippel, M.D.

## 2020-12-09 ENCOUNTER — Telehealth: Payer: Self-pay | Admitting: Internal Medicine

## 2020-12-09 DIAGNOSIS — R5383 Other fatigue: Secondary | ICD-10-CM | POA: Diagnosis not present

## 2020-12-09 DIAGNOSIS — Z510 Encounter for antineoplastic radiation therapy: Secondary | ICD-10-CM | POA: Diagnosis present

## 2020-12-09 DIAGNOSIS — D72819 Decreased white blood cell count, unspecified: Secondary | ICD-10-CM | POA: Diagnosis not present

## 2020-12-09 DIAGNOSIS — C7951 Secondary malignant neoplasm of bone: Secondary | ICD-10-CM | POA: Diagnosis not present

## 2020-12-09 DIAGNOSIS — I10 Essential (primary) hypertension: Secondary | ICD-10-CM | POA: Diagnosis not present

## 2020-12-09 DIAGNOSIS — Z87891 Personal history of nicotine dependence: Secondary | ICD-10-CM | POA: Diagnosis not present

## 2020-12-09 DIAGNOSIS — Z79899 Other long term (current) drug therapy: Secondary | ICD-10-CM | POA: Diagnosis not present

## 2020-12-09 DIAGNOSIS — C3411 Malignant neoplasm of upper lobe, right bronchus or lung: Secondary | ICD-10-CM | POA: Diagnosis not present

## 2020-12-09 NOTE — Telephone Encounter (Signed)
Sch per 12/1 los, left msg

## 2020-12-12 ENCOUNTER — Ambulatory Visit
Admission: RE | Admit: 2020-12-12 | Discharge: 2020-12-12 | Disposition: A | Discharge status: Home / Self Care | Payer: 59 | Source: Ambulatory Visit | Attending: Radiation Oncology | Admitting: Radiation Oncology

## 2020-12-12 ENCOUNTER — Other Ambulatory Visit: Payer: Self-pay

## 2020-12-12 DIAGNOSIS — Z510 Encounter for antineoplastic radiation therapy: Secondary | ICD-10-CM | POA: Diagnosis not present

## 2020-12-12 MED FILL — Fosaprepitant Dimeglumine For IV Infusion 150 MG (Base Eq): INTRAVENOUS | Qty: 5 | Status: AC

## 2020-12-12 MED FILL — Dexamethasone Sodium Phosphate Inj 100 MG/10ML: INTRAMUSCULAR | Qty: 1 | Status: AC

## 2020-12-13 ENCOUNTER — Ambulatory Visit
Admission: RE | Admit: 2020-12-13 | Discharge: 2020-12-13 | Disposition: A | Discharge status: Home / Self Care | Payer: 59 | Source: Ambulatory Visit | Attending: Radiation Oncology | Admitting: Radiation Oncology

## 2020-12-13 ENCOUNTER — Inpatient Hospital Stay: Payer: 59 | Attending: Hematology and Oncology

## 2020-12-13 DIAGNOSIS — Z79899 Other long term (current) drug therapy: Secondary | ICD-10-CM | POA: Insufficient documentation

## 2020-12-13 DIAGNOSIS — Z510 Encounter for antineoplastic radiation therapy: Secondary | ICD-10-CM | POA: Diagnosis not present

## 2020-12-13 DIAGNOSIS — I10 Essential (primary) hypertension: Secondary | ICD-10-CM | POA: Insufficient documentation

## 2020-12-13 DIAGNOSIS — R5383 Other fatigue: Secondary | ICD-10-CM | POA: Insufficient documentation

## 2020-12-13 DIAGNOSIS — C7951 Secondary malignant neoplasm of bone: Secondary | ICD-10-CM | POA: Insufficient documentation

## 2020-12-13 DIAGNOSIS — D72819 Decreased white blood cell count, unspecified: Secondary | ICD-10-CM | POA: Insufficient documentation

## 2020-12-13 DIAGNOSIS — Z87891 Personal history of nicotine dependence: Secondary | ICD-10-CM | POA: Insufficient documentation

## 2020-12-13 DIAGNOSIS — C3411 Malignant neoplasm of upper lobe, right bronchus or lung: Secondary | ICD-10-CM | POA: Insufficient documentation

## 2020-12-13 LAB — CMP (CANCER CENTER ONLY)
ALT: 19 U/L (ref 0–44)
AST: 20 U/L (ref 15–41)
Albumin: 3.7 g/dL (ref 3.5–5.0)
Alkaline Phosphatase: 85 U/L (ref 38–126)
Anion gap: 12 (ref 5–15)
BUN: 20 mg/dL (ref 8–23)
CO2: 26 mmol/L (ref 22–32)
Calcium: 10.1 mg/dL (ref 8.9–10.3)
Chloride: 102 mmol/L (ref 98–111)
Creatinine: 1.18 mg/dL (ref 0.61–1.24)
GFR, Estimated: >60 mL/min (ref >60)
Glucose, Bld: 91 mg/dL (ref 70–99)
Potassium: 3.4 mmol/L — ABNORMAL LOW (ref 3.5–5.1)
Sodium: 140 mmol/L (ref 135–145)
Total Bilirubin: 0.9 mg/dL (ref 0.3–1.2)
Total Protein: 7.7 g/dL (ref 6.5–8.1)

## 2020-12-13 LAB — CBC WITH DIFFERENTIAL/PLATELET
Abs Immature Granulocytes: 0.02 10*3/uL (ref 0.00–0.07)
Basophils Absolute: 0 10*3/uL (ref 0.0–0.1)
Basophils Relative: 1 %
Eosinophils Absolute: 0.2 10*3/uL (ref 0.0–0.5)
Eosinophils Relative: 3 %
HCT: 39.5 % (ref 39.0–52.0)
Hemoglobin: 12.9 g/dL — ABNORMAL LOW (ref 13.0–17.0)
Immature Granulocytes: 0 %
Lymphocytes Relative: 16 %
Lymphs Abs: 0.8 10*3/uL (ref 0.7–4.0)
MCH: 26.4 pg (ref 26.0–34.0)
MCHC: 32.7 g/dL (ref 30.0–36.0)
MCV: 80.8 fL (ref 80.0–100.0)
Monocytes Absolute: 0.6 10*3/uL (ref 0.1–1.0)
Monocytes Relative: 11 %
Neutro Abs: 3.8 10*3/uL (ref 1.7–7.7)
Neutrophils Relative %: 69 %
Platelets: 237 10*3/uL (ref 150–400)
RBC: 4.89 MIL/uL (ref 4.22–5.81)
RDW: 14.4 % (ref 11.5–15.5)
WBC: 5.4 10*3/uL (ref 4.0–10.5)
nRBC: 0 % (ref 0.0–0.2)

## 2020-12-13 MED FILL — Dexamethasone Sodium Phosphate Inj 100 MG/10ML: INTRAMUSCULAR | Qty: 1 | Status: AC

## 2020-12-14 ENCOUNTER — Ambulatory Visit
Admission: RE | Admit: 2020-12-14 | Discharge: 2020-12-14 | Disposition: A | Discharge status: Home / Self Care | Payer: 59 | Source: Ambulatory Visit | Attending: Radiation Oncology | Admitting: Radiation Oncology

## 2020-12-14 ENCOUNTER — Inpatient Hospital Stay: Payer: 59

## 2020-12-14 ENCOUNTER — Other Ambulatory Visit: Payer: Self-pay

## 2020-12-14 VITALS — BP 118/76 | HR 109 | Temp 97.8°F | Resp 17 | Wt 186.4 lb

## 2020-12-14 DIAGNOSIS — Z510 Encounter for antineoplastic radiation therapy: Secondary | ICD-10-CM | POA: Diagnosis not present

## 2020-12-14 DIAGNOSIS — C3411 Malignant neoplasm of upper lobe, right bronchus or lung: Secondary | ICD-10-CM

## 2020-12-14 LAB — TSH: TSH: 1.536 u[IU]/mL (ref 0.320–4.118)

## 2020-12-14 MED ORDER — SODIUM CHLORIDE 0.9 % IV SOLN
10.0000 mg | Freq: Once | INTRAVENOUS | Status: AC
  Administered 2020-12-14: 10 mg via INTRAVENOUS
  Filled 2020-12-14: qty 10

## 2020-12-14 MED ORDER — SODIUM CHLORIDE 0.9 % IV SOLN: Freq: Once | INTRAVENOUS | Status: AC

## 2020-12-14 MED ORDER — SODIUM CHLORIDE 0.9 % IV SOLN
1500.0000 mg | Freq: Once | INTRAVENOUS | Status: AC
  Administered 2020-12-14: 1500 mg via INTRAVENOUS
  Filled 2020-12-14: qty 30

## 2020-12-14 MED ORDER — SODIUM CHLORIDE 0.9 % IV SOLN
150.0000 mg | Freq: Once | INTRAVENOUS | Status: AC
  Administered 2020-12-14: 150 mg via INTRAVENOUS
  Filled 2020-12-14: qty 150

## 2020-12-14 MED ORDER — SODIUM CHLORIDE 0.9 % IV SOLN
510.0000 mg | Freq: Once | INTRAVENOUS | Status: AC
  Administered 2020-12-14: 510 mg via INTRAVENOUS
  Filled 2020-12-14: qty 51

## 2020-12-14 MED ORDER — PALONOSETRON HCL INJECTION 0.25 MG/5ML
0.2500 mg | Freq: Once | INTRAVENOUS | Status: AC
  Administered 2020-12-14: 0.25 mg via INTRAVENOUS
  Filled 2020-12-14: qty 5

## 2020-12-14 MED ORDER — SODIUM CHLORIDE 0.9 % IV SOLN
100.0000 mg/m2 | Freq: Once | INTRAVENOUS | Status: AC
  Administered 2020-12-14: 200 mg via INTRAVENOUS
  Filled 2020-12-14: qty 10

## 2020-12-14 MED ORDER — TRILACICLIB DIHYDROCHLORIDE INJECTION 300 MG
240.0000 mg/m2 | Freq: Once | INTRAVENOUS | Status: AC
  Administered 2020-12-14: 480 mg via INTRAVENOUS
  Filled 2020-12-14: qty 32

## 2020-12-14 NOTE — Patient Instructions (Signed)
[facility]  Discharge Instructions: Thank you for choosing [facility] to provide your oncology and hematology care.   If you have a lab appointment with the [facility], please go directly to the [facility] and check in at the registration area.   Wear comfortable clothing and clothing appropriate for easy access to any Portacath or PICC line.   We strive to give you quality time with your provider. You may need to reschedule your appointment if you arrive late (15 or more minutes).  Arriving late affects you and other patients whose appointments are after yours.  Also, if you miss three or more appointments without notifying the office, you may be dismissed from the clinic at the provider's discretion.      For prescription refill requests, have your pharmacy contact our office and allow 72 hours for refills to be completed.    Today you received the following chemotherapy and/or immunotherapy agents : Imfinzi, Carboplatin, etoposide      To help prevent nausea and vomiting after your treatment, we encourage you to take your nausea medication as directed.  BELOW ARE SYMPTOMS THAT SHOULD BE REPORTED IMMEDIATELY: *FEVER GREATER THAN 100.4 F (38 C) OR HIGHER *CHILLS OR SWEATING *NAUSEA AND VOMITING THAT IS NOT CONTROLLED WITH YOUR NAUSEA MEDICATION *UNUSUAL SHORTNESS OF BREATH *UNUSUAL BRUISING OR BLEEDING *URINARY PROBLEMS (pain or burning when urinating, or frequent urination) *BOWEL PROBLEMS (unusual diarrhea, constipation, pain near the anus) TENDERNESS IN MOUTH AND THROAT WITH OR WITHOUT PRESENCE OF ULCERS (sore throat, sores in mouth, or a toothache) UNUSUAL RASH, SWELLING OR PAIN  UNUSUAL VAGINAL DISCHARGE OR ITCHING   Items with * indicate a potential emergency and should be followed up as soon as possible or go to the Emergency Department if any problems should occur.  Please show the CHEMOTHERAPY ALERT CARD or IMMUNOTHERAPY  ALERT CARD at check-in to the Emergency Department and triage nurse.  Should you have questions after your visit or need to cancel or reschedule your appointment, please contact [facility]  Dept: [phone]  and follow the prompts.  Office hours are 8:00 a.m. to 4:30 p.m. Monday - Friday. Please note that voicemails left after 4:00 p.m. may not be returned until the following business day.  We are closed weekends and major holidays. You have access to a nurse at all times for urgent questions. Please call the main number to the clinic Dept: [phone] and follow the prompts.   For any non-urgent questions, you may also contact your provider using MyChart. We now offer e-Visits for anyone 86 and older to request care online for non-urgent symptoms. For details visit [email or web address].   Also download the MyChart app! Go to the app store, search "MyChart", open the app, select [facility], and log in with your MyChart username and password.  Due to Covid, a mask is required upon entering the hospital/clinic. If you do not have a mask, one will be given to you upon arrival. For doctor visits, patients may have 1 support person aged 91 or older with them. For treatment visits, patients cannot have anyone with them due to current Covid guidelines and our immunocompromised population.   Durvalumab injection What is this medication? DURVALUMAB (dur VAL ue mab) is a monoclonal antibody. It is used to treat lung cancer. This medicine may be used for other purposes; ask your health care provider or pharmacist if you have questions. COMMON BRAND NAME(S): IMFINZI What  should I tell my care team before I take this medication? They need to know if you have any of these conditions: autoimmune diseases like Crohn's disease, ulcerative colitis, or lupus have had or planning to have an allogeneic stem cell transplant (uses someone else's stem cells) history of organ  transplant history of radiation to the chest nervous system problems like myasthenia gravis or Guillain-Barre syndrome an unusual or allergic reaction to durvalumab, other medicines, foods, dyes, or preservatives pregnant or trying to get pregnant breast-feeding How should I use this medication? This medicine is for infusion into a vein. It is given by a health care professional in a hospital or clinic setting. A special MedGuide will be given to you before each treatment. Be sure to read this information carefully each time. Talk to your pediatrician regarding the use of this medicine in children. Special care may be needed. Overdosage: If you think you have taken too much of this medicine contact a poison control center or emergency room at once. NOTE: This medicine is only for you. Do not share this medicine with others. What if I miss a dose? It is important not to miss your dose. Call your doctor or health care professional if you are unable to keep an appointment. What may interact with this medication? Interactions have not been studied. This list may not describe all possible interactions. Give your health care provider a list of all the medicines, herbs, non-prescription drugs, or dietary supplements you use. Also tell them if you smoke, drink alcohol, or use illegal drugs. Some items may interact with your medicine. What should I watch for while using this medication? This medication may make you feel generally unwell. Continue your course of treatment even though you feel ill unless your care team tells you to stop. You may need blood work done while you are taking this medication. Do not become pregnant while taking this medication or for 3 months after stopping it. Women should inform their care team if they wish to become pregnant or think they might be pregnant. There is a potential for serious side effects to an unborn child. Talk to your care team or pharmacist for more  information. Do not breast-feed an infant while taking this medication or for 3 months after stopping it. What side effects may I notice from receiving this medication? Side effects that you should report to your care team as soon as possible: Allergic reactions--skin rash, itching, hives, swelling of the face, lips, tongue, or throat Bloody or watery diarrhea Dizziness, loss of balance or coordination, confusion or trouble speaking Dry cough, shortness of breath or trouble breathing Flushing, mostly over the face, neck, and chest, during injection High blood sugar (hyperglycemia)--increased thirst or amount of urine, unusual weakness or fatigue, blurry vision High thyroid levels (hyperthyroidism)--fast or irregular heartbeat, weight loss, excessive sweating or sensitivity to heat, tremors or shaking, anxiety, nervousness, irregular menstrual cycle or spotting Infection--fever, chills, cough, or sore throat Liver injury--right upper belly pain, loss of appetite, nausea, light-colored stool, dark yellow or brown urine, yellowing skin or eyes, unusual weakness or fatigue Low adrenal gland function--nausea, vomiting, loss of appetite, unusual weakness or fatigue, dizziness, low blood pressure Low thyroid levels (hypothyroidism)--unusual weakness or fatigue, increased sensitivity to cold, constipation, hair loss, dry skin, weight gain, feelings of depression Pancreatitis--severe stomach pain that spreads to your back or gets worse after eating or when touched, fever, nausea, vomiting Rash, fever, and swollen lymph nodes Redness, blistering, peeling or loosening of  the skin, including inside the mouth Wheezing--trouble breathing with loud or whistling sounds Side effects that usually do not require medical attention (report these to your care team if they continue or are bothersome): Fatigue Hair loss This list may not describe all possible side effects. Call your doctor for medical advice about side  effects. You may report side effects to FDA at 1-800-FDA-1088. Where should I keep my medication? This medication is given in a hospital or clinic. It will not be stored at home. NOTE: This sheet is a summary. It may not cover all possible information. If you have questions about this medicine, talk to your doctor, pharmacist, or health care provider.  2022 Elsevier/Gold Standard (2020-09-13 00:00:00)  Carboplatin injection What is this medication? CARBOPLATIN (KAR boe pla tin) is a chemotherapy drug. It targets fast dividing cells, like cancer cells, and causes these cells to die. This medicine is used to treat ovarian cancer and many other cancers. This medicine may be used for other purposes; ask your health care provider or pharmacist if you have questions. COMMON BRAND NAME(S): Paraplatin What should I tell my care team before I take this medication? They need to know if you have any of these conditions: blood disorders hearing problems kidney disease recent or ongoing radiation therapy an unusual or allergic reaction to carboplatin, cisplatin, other chemotherapy, other medicines, foods, dyes, or preservatives pregnant or trying to get pregnant breast-feeding How should I use this medication? This drug is usually given as an infusion into a vein. It is administered in a hospital or clinic by a specially trained health care professional. Talk to your pediatrician regarding the use of this medicine in children. Special care may be needed. Overdosage: If you think you have taken too much of this medicine contact a poison control center or emergency room at once. NOTE: This medicine is only for you. Do not share this medicine with others. What if I miss a dose? It is important not to miss a dose. Call your doctor or health care professional if you are unable to keep an appointment. What may interact with this medication? medicines for seizures medicines to increase blood counts like  filgrastim, pegfilgrastim, sargramostim some antibiotics like amikacin, gentamicin, neomycin, streptomycin, tobramycin vaccines Talk to your doctor or health care professional before taking any of these medicines: acetaminophen aspirin ibuprofen ketoprofen naproxen This list may not describe all possible interactions. Give your health care provider a list of all the medicines, herbs, non-prescription drugs, or dietary supplements you use. Also tell them if you smoke, drink alcohol, or use illegal drugs. Some items may interact with your medicine. What should I watch for while using this medication? Your condition will be monitored carefully while you are receiving this medicine. You will need important blood work done while you are taking this medicine. This drug may make you feel generally unwell. This is not uncommon, as chemotherapy can affect healthy cells as well as cancer cells. Report any side effects. Continue your course of treatment even though you feel ill unless your doctor tells you to stop. In some cases, you may be given additional medicines to help with side effects. Follow all directions for their use. Call your doctor or health care professional for advice if you get a fever, chills or sore throat, or other symptoms of a cold or flu. Do not treat yourself. This drug decreases your body's ability to fight infections. Try to avoid being around people who are sick. This medicine may  increase your risk to bruise or bleed. Call your doctor or health care professional if you notice any unusual bleeding. Be careful brushing and flossing your teeth or using a toothpick because you may get an infection or bleed more easily. If you have any dental work done, tell your dentist you are receiving this medicine. Avoid taking products that contain aspirin, acetaminophen, ibuprofen, naproxen, or ketoprofen unless instructed by your doctor. These medicines may hide a fever. Do not become pregnant  while taking this medicine. Women should inform their doctor if they wish to become pregnant or think they might be pregnant. There is a potential for serious side effects to an unborn child. Talk to your health care professional or pharmacist for more information. Do not breast-feed an infant while taking this medicine. What side effects may I notice from receiving this medication? Side effects that you should report to your doctor or health care professional as soon as possible: allergic reactions like skin rash, itching or hives, swelling of the face, lips, or tongue signs of infection - fever or chills, cough, sore throat, pain or difficulty passing urine signs of decreased platelets or bleeding - bruising, pinpoint red spots on the skin, black, tarry stools, nosebleeds signs of decreased red blood cells - unusually weak or tired, fainting spells, lightheadedness breathing problems changes in hearing changes in vision chest pain high blood pressure low blood counts - This drug may decrease the number of white blood cells, red blood cells and platelets. You may be at increased risk for infections and bleeding. nausea and vomiting pain, swelling, redness or irritation at the injection site pain, tingling, numbness in the hands or feet problems with balance, talking, walking trouble passing urine or change in the amount of urine Side effects that usually do not require medical attention (report to your doctor or health care professional if they continue or are bothersome): hair loss loss of appetite metallic taste in the mouth or changes in taste This list may not describe all possible side effects. Call your doctor for medical advice about side effects. You may report side effects to FDA at 1-800-FDA-1088. Where should I keep my medication? This drug is given in a hospital or clinic and will not be stored at home. NOTE: This sheet is a summary. It may not cover all possible information. If  you have questions about this medicine, talk to your doctor, pharmacist, or health care provider.  2022 Elsevier/Gold Standard (2007-06-04 00:00:00)  Etoposide, VP-16 capsules What is this medication? ETOPOSIDE, VP-16 (e toe POE side) is a chemotherapy drug. It is used to treat small cell lung cancer and other cancers. This medicine may be used for other purposes; ask your health care provider or pharmacist if you have questions. COMMON BRAND NAME(S): VePesid What should I tell my care team before I take this medication? They need to know if you have any of these conditions: infection kidney disease liver disease low blood counts, like low white cell, platelet, or red cell counts an unusual or allergic reaction to etoposide, other medicines, foods, dyes, or preservatives pregnant or trying to get pregnant breast-feeding How should I use this medication? Take this medicine by mouth with a glass of water. Follow the directions on the prescription label. Do not open, crush, or chew the capsules. It is advisable to wear gloves when handling this medicine. Take your medicine at regular intervals. Do not take it more often than directed. Do not stop taking except on your doctor's  advice. Talk to your pediatrician regarding the use of this medicine in children. Special care may be needed. Overdosage: If you think you have taken too much of this medicine contact a poison control center or emergency room at once. NOTE: This medicine is only for you. Do not share this medicine with others. What if I miss a dose? If you miss a dose, take it as soon as you can. If it is almost time for your next dose, take only that dose. Do not take double or extra doses. What may interact with this medication? This medicine may interact with the following medications: cyclosporine warfarin This list may not describe all possible interactions. Give your health care provider a list of all the medicines, herbs,  non-prescription drugs, or dietary supplements you use. Also tell them if you smoke, drink alcohol, or use illegal drugs. Some items may interact with your medicine. What should I watch for while using this medication? Visit your doctor for checks on your progress. This drug may make you feel generally unwell. This is not uncommon, as chemotherapy can affect healthy cells as well as cancer cells. Report any side effects. Continue your course of treatment even though you feel ill unless your doctor tells you to stop. In some cases, you may be given additional medicines to help with side effects. Follow all directions for their use. Call your doctor or health care professional for advice if you get a fever, chills or sore throat, or other symptoms of a cold or flu. Do not treat yourself. This drug decreases your body's ability to fight infections. Try to avoid being around people who are sick. This medicine may increase your risk to bruise or bleed. Call your doctor or health care professional if you notice any unusual bleeding. Talk to your doctor about your risk of cancer. You may be more at risk for certain types of cancers if you take this medicine. Do not become pregnant while taking this medicine or for at least 6 months after stopping it. Women should inform their doctor if they wish to become pregnant or think they might be pregnant. Women of child-bearing potential will need to have a negative pregnancy test before starting this medicine. There is a potential for serious side effects to an unborn child. Talk to your health care professional or pharmacist for more information. Do not breast-feed an infant while taking this medicine. Men must use a latex condom during sexual contact with a woman while taking this medicine and for at least 4 months after stopping it. A latex condom is needed even if you have had a vasectomy. Contact your doctor right away if your partner becomes pregnant. Do not donate  sperm while taking this medicine and for 4 months after you stop taking this medicine. Men should inform their doctors if they wish to father a child. This medicine may lower sperm counts. What side effects may I notice from receiving this medication? Side effects that you should report to your doctor or health care professional as soon as possible: allergic reactions like skin rash, itching or hives, swelling of the face, lips, or tongue low blood counts - this medicine may decrease the number of white blood cells, red blood cells, and platelets. You may be at increased risk for infections and bleeding nausea, vomiting redness, blistering, peeling or loosening of the skin, including inside the mouth signs and symptoms of infection like fever; chills; cough; sore throat; pain or trouble passing urine signs  and symptoms of low red blood cells or anemia such as unusually weak or tired; feeling faint or lightheaded; falls; breathing problems unusual bruising or bleeding Side effects that usually do not require medical attention (report to your doctor or health care professional if they continue or are bothersome): changes in taste diarrhea hair loss loss of appetite mouth sores This list may not describe all possible side effects. Call your doctor for medical advice about side effects. You may report side effects to FDA at 1-800-FDA-1088. Where should I keep my medication? Keep out of the reach of children. Store in a refrigerator between 2 and 8 degrees C (36 and 46 degrees F). Do not freeze. Throw away any unused medicine after the expiration date. NOTE: This sheet is a summary. It may not cover all possible information. If you have questions about this medicine, talk to your doctor, pharmacist, or health care provider.  2022 Elsevier/Gold Standard (2020-09-13 00:00:00)

## 2020-12-14 NOTE — Progress Notes (Signed)
Okay to treat with VS per Cassie PA. Also received verbal order for IVF.

## 2020-12-15 ENCOUNTER — Other Ambulatory Visit: Payer: Self-pay

## 2020-12-15 ENCOUNTER — Ambulatory Visit
Admission: RE | Admit: 2020-12-15 | Discharge: 2020-12-15 | Disposition: A | Discharge status: Home / Self Care | Payer: 59 | Source: Ambulatory Visit | Attending: Radiation Oncology | Admitting: Radiation Oncology

## 2020-12-15 ENCOUNTER — Inpatient Hospital Stay: Payer: 59

## 2020-12-15 VITALS — BP 111/80 | HR 100 | Temp 98.2°F | Resp 18

## 2020-12-15 DIAGNOSIS — Z510 Encounter for antineoplastic radiation therapy: Secondary | ICD-10-CM | POA: Diagnosis not present

## 2020-12-15 DIAGNOSIS — C3411 Malignant neoplasm of upper lobe, right bronchus or lung: Secondary | ICD-10-CM

## 2020-12-15 MED ORDER — TRILACICLIB DIHYDROCHLORIDE INJECTION 300 MG
240.0000 mg/m2 | Freq: Once | INTRAVENOUS | Status: AC
  Administered 2020-12-15: 480 mg via INTRAVENOUS
  Filled 2020-12-15: qty 32

## 2020-12-15 MED ORDER — SODIUM CHLORIDE 0.9 % IV SOLN
100.0000 mg/m2 | Freq: Once | INTRAVENOUS | Status: AC
  Administered 2020-12-15: 200 mg via INTRAVENOUS
  Filled 2020-12-15: qty 10

## 2020-12-15 MED ORDER — SODIUM CHLORIDE 0.9 % IV SOLN
10.0000 mg | Freq: Once | INTRAVENOUS | Status: AC
  Administered 2020-12-15: 10 mg via INTRAVENOUS
  Filled 2020-12-15: qty 10

## 2020-12-15 MED ORDER — SODIUM CHLORIDE 0.9 % IV SOLN: Freq: Once | INTRAVENOUS | Status: AC

## 2020-12-15 MED FILL — Dexamethasone Sodium Phosphate Inj 100 MG/10ML: INTRAMUSCULAR | Qty: 1 | Status: AC

## 2020-12-15 NOTE — Patient Instructions (Signed)
[facility]  Discharge Instructions: Thank you for choosing [facility] to provide your oncology and hematology care.   If you have a lab appointment with the Cancer Center, please go directly to the Cancer Center and check in at the registration area.   Wear comfortable clothing and clothing appropriate for easy access to any Portacath or PICC line.   We strive to give you quality time with your provider. You may need to reschedule your appointment if you arrive late (15 or more minutes).  Arriving late affects you and other patients whose appointments are after yours.  Also, if you miss three or more appointments without notifying the office, you may be dismissed from the clinic at the provider's discretion.      For prescription refill requests, have your pharmacy contact our office and allow 72 hours for refills to be completed.    Today you received the following chemotherapy and/or immunotherapy agents: etoposide.     To help prevent nausea and vomiting after your treatment, we encourage you to take your nausea medication as directed.  BELOW ARE SYMPTOMS THAT SHOULD BE REPORTED IMMEDIATELY: . *FEVER GREATER THAN 100.4 F (38 C) OR HIGHER . *CHILLS OR SWEATING . *NAUSEA AND VOMITING THAT IS NOT CONTROLLED WITH YOUR NAUSEA MEDICATION . *UNUSUAL SHORTNESS OF BREATH . *UNUSUAL BRUISING OR BLEEDING . *URINARY PROBLEMS (pain or burning when urinating, or frequent urination) . *BOWEL PROBLEMS (unusual diarrhea, constipation, pain near the anus) . TENDERNESS IN MOUTH AND THROAT WITH OR WITHOUT PRESENCE OF ULCERS (sore throat, sores in mouth, or a toothache) . UNUSUAL RASH, SWELLING OR PAIN  . UNUSUAL VAGINAL DISCHARGE OR ITCHING   Items with * indicate a potential emergency and should be followed up as soon as possible or go to the Emergency Department if any problems should occur.  Please show the CHEMOTHERAPY ALERT CARD or IMMUNOTHERAPY ALERT  CARD at check-in to the Emergency Department and triage nurse.  Should you have questions after your visit or need to cancel or reschedule your appointment, please contact [facility]  Dept: [phone]  and follow the prompts.  Office hours are 8:00 a.m. to 4:30 p.m. Monday - Friday. Please note that voicemails left after 4:00 p.m. may not be returned until the following business day.  We are closed weekends and major holidays. You have access to a nurse at all times for urgent questions. Please call the main number to the clinic Dept: [phone] and follow the prompts.   For any non-urgent questions, you may also contact your provider using MyChart. We now offer e-Visits for anyone 18 and older to request care online for non-urgent symptoms. For details visit [email or web address].   Also download the MyChart app! Go to the app store, search "MyChart", open the app, select , and log in with your MyChart username and password.  Due to Covid, a mask is required upon entering the hospital/clinic. If you do not have a mask, one will be given to you upon arrival. For doctor visits, patients may have 1 support person aged 18 or older with them. For treatment visits, patients cannot have anyone with them due to current Covid guidelines and our immunocompromised population.   

## 2020-12-16 ENCOUNTER — Inpatient Hospital Stay: Payer: 59

## 2020-12-16 ENCOUNTER — Other Ambulatory Visit: Payer: Self-pay

## 2020-12-16 ENCOUNTER — Ambulatory Visit
Admission: RE | Admit: 2020-12-16 | Discharge: 2020-12-16 | Disposition: A | Discharge status: Home / Self Care | Payer: 59 | Source: Ambulatory Visit | Attending: Radiation Oncology | Admitting: Radiation Oncology

## 2020-12-16 VITALS — BP 140/85 | HR 85 | Temp 98.0°F | Resp 16

## 2020-12-16 DIAGNOSIS — C3411 Malignant neoplasm of upper lobe, right bronchus or lung: Secondary | ICD-10-CM

## 2020-12-16 DIAGNOSIS — Z510 Encounter for antineoplastic radiation therapy: Secondary | ICD-10-CM | POA: Diagnosis not present

## 2020-12-16 MED ORDER — SODIUM CHLORIDE 0.9 % IV SOLN
10.0000 mg | Freq: Once | INTRAVENOUS | Status: AC
  Administered 2020-12-16: 10 mg via INTRAVENOUS
  Filled 2020-12-16: qty 1
  Filled 2020-12-16: qty 10

## 2020-12-16 MED ORDER — TRILACICLIB DIHYDROCHLORIDE INJECTION 300 MG
240.0000 mg/m2 | Freq: Once | INTRAVENOUS | Status: AC
  Administered 2020-12-16: 480 mg via INTRAVENOUS
  Filled 2020-12-16: qty 32

## 2020-12-16 MED ORDER — SODIUM CHLORIDE 0.9 % IV SOLN
100.0000 mg/m2 | Freq: Once | INTRAVENOUS | Status: AC
  Administered 2020-12-16: 200 mg via INTRAVENOUS
  Filled 2020-12-16: qty 10

## 2020-12-16 MED ORDER — SODIUM CHLORIDE 0.9 % IV SOLN: Freq: Once | INTRAVENOUS | Status: AC

## 2020-12-16 NOTE — Patient Instructions (Signed)
[facility]  Discharge Instructions: Thank you for choosing [facility] to provide your oncology and hematology care.   If you have a lab appointment with the [facility], please go directly to the [facility] and check in at the registration area.   Wear comfortable clothing and clothing appropriate for easy access to any Portacath or PICC line.   We strive to give you quality time with your provider. You may need to reschedule your appointment if you arrive late (15 or more minutes).  Arriving late affects you and other patients whose appointments are after yours.  Also, if you miss three or more appointments without notifying the office, you may be dismissed from the clinic at the provider's discretion.      For prescription refill requests, have your pharmacy contact our office and allow 72 hours for refills to be completed.    Today you received the following chemotherapy and/or immunotherapy agents Etoposide      To help prevent nausea and vomiting after your treatment, we encourage you to take your nausea medication as directed.  BELOW ARE SYMPTOMS THAT SHOULD BE REPORTED IMMEDIATELY: *FEVER GREATER THAN 100.4 F (38 C) OR HIGHER *CHILLS OR SWEATING *NAUSEA AND VOMITING THAT IS NOT CONTROLLED WITH YOUR NAUSEA MEDICATION *UNUSUAL SHORTNESS OF BREATH *UNUSUAL BRUISING OR BLEEDING *URINARY PROBLEMS (pain or burning when urinating, or frequent urination) *BOWEL PROBLEMS (unusual diarrhea, constipation, pain near the anus) TENDERNESS IN MOUTH AND THROAT WITH OR WITHOUT PRESENCE OF ULCERS (sore throat, sores in mouth, or a toothache) UNUSUAL RASH, SWELLING OR PAIN  UNUSUAL VAGINAL DISCHARGE OR ITCHING   Items with * indicate a potential emergency and should be followed up as soon as possible or go to the Emergency Department if any problems should occur.  Please show the CHEMOTHERAPY ALERT CARD or IMMUNOTHERAPY ALERT CARD at check-in to  the Emergency Department and triage nurse.  Should you have questions after your visit or need to cancel or reschedule your appointment, please contact [facility]  Dept: [phone]  and follow the prompts.  Office hours are 8:00 a.m. to 4:30 p.m. Monday - Friday. Please note that voicemails left after 4:00 p.m. may not be returned until the following business day.  We are closed weekends and major holidays. You have access to a nurse at all times for urgent questions. Please call the main number to the clinic Dept: [phone] and follow the prompts.   For any non-urgent questions, you may also contact your provider using MyChart. We now offer e-Visits for anyone 8 and older to request care online for non-urgent symptoms. For details visit [email or web address].   Also download the MyChart app! Go to the app store, search "MyChart", open the app, select [facility], and log in with your MyChart username and password.  Due to Covid, a mask is required upon entering the hospital/clinic. If you do not have a mask, one will be given to you upon arrival. For doctor visits, patients may have 1 support person aged 63 or older with them. For treatment visits, patients cannot have anyone with them due to current Covid guidelines and our immunocompromised population.

## 2020-12-19 ENCOUNTER — Other Ambulatory Visit: Payer: Self-pay

## 2020-12-19 ENCOUNTER — Ambulatory Visit
Admission: RE | Admit: 2020-12-19 | Discharge: 2020-12-19 | Disposition: A | Discharge status: Home / Self Care | Payer: 59 | Source: Ambulatory Visit | Attending: Radiation Oncology | Admitting: Radiation Oncology

## 2020-12-19 DIAGNOSIS — Z510 Encounter for antineoplastic radiation therapy: Secondary | ICD-10-CM | POA: Diagnosis not present

## 2020-12-20 ENCOUNTER — Ambulatory Visit
Admission: RE | Admit: 2020-12-20 | Discharge: 2020-12-20 | Disposition: A | Discharge status: Home / Self Care | Payer: 59 | Source: Ambulatory Visit | Attending: Radiation Oncology | Admitting: Radiation Oncology

## 2020-12-20 DIAGNOSIS — Z510 Encounter for antineoplastic radiation therapy: Secondary | ICD-10-CM | POA: Diagnosis not present

## 2020-12-21 ENCOUNTER — Ambulatory Visit
Admission: RE | Admit: 2020-12-21 | Discharge: 2020-12-21 | Disposition: A | Discharge status: Home / Self Care | Payer: 59 | Source: Ambulatory Visit | Attending: Radiation Oncology | Admitting: Radiation Oncology

## 2020-12-21 DIAGNOSIS — Z510 Encounter for antineoplastic radiation therapy: Secondary | ICD-10-CM | POA: Diagnosis not present

## 2020-12-22 ENCOUNTER — Inpatient Hospital Stay (HOSPITAL_BASED_OUTPATIENT_CLINIC_OR_DEPARTMENT_OTHER): Payer: 59 | Admitting: Internal Medicine

## 2020-12-22 ENCOUNTER — Inpatient Hospital Stay: Payer: 59

## 2020-12-22 ENCOUNTER — Ambulatory Visit: Admission: RE | Admit: 2020-12-22 | Payer: 59 | Source: Ambulatory Visit

## 2020-12-22 ENCOUNTER — Other Ambulatory Visit: Payer: Self-pay

## 2020-12-22 ENCOUNTER — Ambulatory Visit: Payer: 59

## 2020-12-22 VITALS — BP 153/96 | HR 85 | Temp 96.9°F | Resp 18 | Ht 69.0 in | Wt 191.4 lb

## 2020-12-22 DIAGNOSIS — D701 Agranulocytosis secondary to cancer chemotherapy: Secondary | ICD-10-CM

## 2020-12-22 DIAGNOSIS — C3411 Malignant neoplasm of upper lobe, right bronchus or lung: Secondary | ICD-10-CM

## 2020-12-22 DIAGNOSIS — Z5111 Encounter for antineoplastic chemotherapy: Secondary | ICD-10-CM

## 2020-12-22 DIAGNOSIS — T451X5A Adverse effect of antineoplastic and immunosuppressive drugs, initial encounter: Secondary | ICD-10-CM | POA: Diagnosis not present

## 2020-12-22 DIAGNOSIS — Z510 Encounter for antineoplastic radiation therapy: Secondary | ICD-10-CM | POA: Diagnosis not present

## 2020-12-22 DIAGNOSIS — C3491 Malignant neoplasm of unspecified part of right bronchus or lung: Secondary | ICD-10-CM

## 2020-12-22 LAB — CMP (CANCER CENTER ONLY)
ALT: 34 U/L (ref 0–44)
AST: 25 U/L (ref 15–41)
Albumin: 3.3 g/dL — ABNORMAL LOW (ref 3.5–5.0)
Alkaline Phosphatase: 71 U/L (ref 38–126)
Anion gap: 9 (ref 5–15)
BUN: 8 mg/dL (ref 8–23)
CO2: 26 mmol/L (ref 22–32)
Calcium: 9.3 mg/dL (ref 8.9–10.3)
Chloride: 103 mmol/L (ref 98–111)
Creatinine: 0.83 mg/dL (ref 0.61–1.24)
GFR, Estimated: >60 mL/min (ref >60)
Glucose, Bld: 89 mg/dL (ref 70–99)
Potassium: 3.5 mmol/L (ref 3.5–5.1)
Sodium: 138 mmol/L (ref 135–145)
Total Bilirubin: 0.5 mg/dL (ref 0.3–1.2)
Total Protein: 6.7 g/dL (ref 6.5–8.1)

## 2020-12-22 LAB — CBC WITH DIFFERENTIAL (CANCER CENTER ONLY)
Abs Immature Granulocytes: 0.05 10*3/uL (ref 0.00–0.07)
Basophils Absolute: 0 10*3/uL (ref 0.0–0.1)
Basophils Relative: 2 %
Eosinophils Absolute: 0.1 10*3/uL (ref 0.0–0.5)
Eosinophils Relative: 5 %
HCT: 32.7 % — ABNORMAL LOW (ref 39.0–52.0)
Hemoglobin: 10.4 g/dL — ABNORMAL LOW (ref 13.0–17.0)
Immature Granulocytes: 3 %
Lymphocytes Relative: 27 %
Lymphs Abs: 0.5 10*3/uL — ABNORMAL LOW (ref 0.7–4.0)
MCH: 25.8 pg — ABNORMAL LOW (ref 26.0–34.0)
MCHC: 31.8 g/dL (ref 30.0–36.0)
MCV: 81.1 fL (ref 80.0–100.0)
Monocytes Absolute: 0.1 10*3/uL (ref 0.1–1.0)
Monocytes Relative: 5 %
Neutro Abs: 1 10*3/uL — ABNORMAL LOW (ref 1.7–7.7)
Neutrophils Relative %: 58 %
Platelet Count: 159 10*3/uL (ref 150–400)
RBC: 4.03 MIL/uL — ABNORMAL LOW (ref 4.22–5.81)
RDW: 13.9 % (ref 11.5–15.5)
WBC Count: 1.7 10*3/uL — ABNORMAL LOW (ref 4.0–10.5)
nRBC: 0 % (ref 0.0–0.2)

## 2020-12-22 LAB — TSH: TSH: 1.405 u[IU]/mL (ref 0.320–4.118)

## 2020-12-22 NOTE — Progress Notes (Signed)
[facility] Telephone:([phone]   [phone]  OFFICE PROGRESS NOTE  de [place], [name], MD [address] [place] [address]  DIAGNOSIS: Metastatic small cell lung cancer initially diagnosed as Limited stage (T1c, N2, M0) small cell lung cancer diagnosed in February 2022.  PRIOR THERAPY: Systemic chemotherapy according to phase 3 randomized clinical trial of chemoradiation versus chemoradiation plus atezolizumab status post 3 cycles.  This treatment was discontinued secondary to intolerance.  CURRENT THERAPY: Second line systemic chemotherapy with carboplatin for AUC of 5 on day 1, etoposide 100 Mg/M2 on days 1, 2 and 3 as well as Imfinzi 1500 Mg IV on day 1 and Cosela 240 Mg/M2 before chemotherapy on days 1, 2 and 3 every 3 weeks.  First dose December 13, 2020.  Status post 1 cycle  INTERVAL HISTORY: Adam [name] 65 y.o. male returns to the clinic today for follow-up visit.  The patient is feeling fine today with no concerning complaints except for mild fatigue.  He tolerated the first week of his treatment fairly well except for the pain on the left hip area which is getting better.  He was seen by Dr. Tammi Klippel and starting palliative radiotherapy.  He also takes some Aleve at least once a day and it does help but I advised him against using Aleve at regular basis.  The patient denied having any current chest pain, shortness of breath, cough or hemoptysis.  He denied having any fever or chills.  He has no nausea, vomiting, diarrhea or constipation.  He has no headache or visual changes.  He is here today for evaluation and repeat blood work.   MEDICAL HISTORY: Past Medical History:  Diagnosis Date   Hypertension    Lung cancer ([facility])    small cell RUL    ALLERGIES:  has No Known Allergies.  MEDICATIONS:  Current Outpatient Medications  Medication Sig Dispense Refill   amLODipine (NORVASC) 10 MG tablet Take 10 mg by mouth daily.     bisacodyl  (DULCOLAX) 5 MG EC tablet Take 5 mg by mouth daily as needed for moderate constipation.     ibuprofen (ADVIL) 600 MG tablet Take 600 mg by mouth every 6 (six) hours as needed.     lisinopril-hydrochlorothiazide (ZESTORETIC) 20-12.5 MG tablet Take 1 tablet by mouth daily. 90 tablet 1   No current facility-administered medications for this visit.    SURGICAL HISTORY:  Past Surgical History:  Procedure Laterality Date   BRONCHIAL BRUSHINGS  02/08/2020   Procedure: BRONCHIAL BRUSHINGS;  Surgeon: Freddi Starr, MD;  Location: [facility];  Service: Pulmonary;;   BRONCHIAL NEEDLE ASPIRATION BIOPSY  02/08/2020   Procedure: BRONCHIAL NEEDLE ASPIRATION BIOPSIES;  Surgeon: Freddi Starr, MD;  Location: [facility];  Service: Pulmonary;;   KNEE SURGERY Right    SHOULDER SURGERY Left    VIDEO BRONCHOSCOPY WITH ENDOBRONCHIAL ULTRASOUND N/A 02/08/2020   Procedure: VIDEO BRONCHOSCOPY WITH ENDOBRONCHIAL ULTRASOUND;  Surgeon: Freddi Starr, MD;  Location: [facility];  Service: Pulmonary;  Laterality: N/A;    REVIEW OF SYSTEMS:  A comprehensive review of systems was negative except for: Constitutional: positive for fatigue Musculoskeletal: positive for bone pain   PHYSICAL EXAMINATION: General appearance: alert, cooperative, fatigued, and no distress Head: Normocephalic, without obvious abnormality, atraumatic Neck: no adenopathy, no JVD, supple, symmetrical, trachea midline, and thyroid not enlarged, symmetric, no tenderness/mass/nodules Lymph nodes: Cervical, supraclavicular, and axillary nodes normal. Resp: clear to auscultation bilaterally Back: symmetric, no curvature.  ROM normal. No CVA tenderness. Cardio: regular rate and rhythm, S1, S2 normal, no murmur, click, rub or gallop GI: soft, non-tender; bowel sounds normal; no masses,  no organomegaly Extremities: extremities normal, atraumatic, no cyanosis or edema  ECOG PERFORMANCE STATUS: 1 - Symptomatic but completely  ambulatory  Blood pressure (!) 153/96, pulse 85, temperature (!) 96.9 F (36.1 C), temperature source Tympanic, resp. rate 18, height 5\' 9"  (1.753 m), weight 191 lb 6.4 oz (86.8 kg), SpO2 100 %.  LABORATORY DATA: Lab Results  Component Value Date   WBC 1.7 (L) 12/22/2020   HGB 10.4 (L) 12/22/2020   HCT 32.7 (L) 12/22/2020   MCV 81.1 12/22/2020   PLT 159 12/22/2020      Chemistry      Component Value Date/Time   NA 140 12/13/2020 1532   K 3.4 (L) 12/13/2020 1532   CL 102 12/13/2020 1532   CO2 26 12/13/2020 1532   BUN 20 12/13/2020 1532   CREATININE 1.18 12/13/2020 1532      Component Value Date/Time   CALCIUM 10.1 12/13/2020 1532   ALKPHOS 85 12/13/2020 1532   AST 20 12/13/2020 1532   ALT 19 12/13/2020 1532   BILITOT 0.9 12/13/2020 1532       RADIOGRAPHIC STUDIES: No results found.  ASSESSMENT AND PLAN: This is a very pleasant 65 years old African-American male recently diagnosed with limited stage small cell lung cancer in February 2022.  The patient is currently undergoing treatment according to the [facility] clinical trial where the patient would be randomized to treatment with concurrent chemoradiation with cisplatin and/or etoposide versus concurrent chemoradiation with the same regimen in addition to atezolizumab. He is status post 3 cycles of the treatment.  His treatment was discontinued secondary to intolerance and the patient has been in observation since April 2022. He had MRI of the brain performed recently that showed no evidence of metastatic disease to the brain. Repeat CT scan of the chest, abdomen and pelvis unfortunately showed evidence for disease progression involving new mass centered in the superior aspect of the right major fissure, widespread nodularity in the adjacent portion of the upper right lung, multiple new pleural-based lesions in the right hemithorax, new middle mediastinal lymphadenopathy, new hypervascular lesion in the proximal body of the  pancreas and new lytic lesions in the parasymphyseal region of the left to pubic bone. His insurance declined a PET scan. The patient is started palliative systemic chemotherapy with carboplatin for AUC of 5 on day 1, etoposide 100 Mg/M2 on days 1, 2 and 3 with Cosela 4 Myeloprotection before chemotherapy and Imfinzi 1500 Mg IV every 3 weeks with the induction phase followed by maintenance Imfinzi every 4 weeks if the patient has no evidence for disease progression.  He is status post 1 cycle.  He tolerated the first week of his treatment fairly well with no concerning adverse effects. CBC today showed leukocytopenia and neutropenia.  I will arrange for the patient to receive G-CSF injection 480 mcg subcutaneously daily for 2 days to improve his count before the next cycle of his treatment. I will see him back for follow-up visit in 2 weeks for evaluation before starting cycle #2 of his treatment. Regarding the hip pain, he is currently undergoing palliative radiotherapy under the care of Dr. Tammi Klippel. The patient was advised to call immediately if he has any other concerning symptoms in the interval. The patient voices understanding of current disease status and treatment options and is in agreement with the  current care plan.  All questions were answered. The patient knows to call the clinic with any problems, questions or concerns. We can certainly see the patient much sooner if necessary.  The total time spent in the appointment was 30 minutes.  Disclaimer: This note was dictated with voice recognition software. Similar sounding words can inadvertently be transcribed and may not be corrected upon review.

## 2020-12-23 ENCOUNTER — Ambulatory Visit: Payer: 59 | Admitting: Urology

## 2020-12-23 ENCOUNTER — Ambulatory Visit: Payer: 59

## 2020-12-23 ENCOUNTER — Ambulatory Visit
Admission: RE | Admit: 2020-12-23 | Discharge: 2020-12-23 | Disposition: A | Discharge status: Home / Self Care | Payer: 59 | Source: Ambulatory Visit | Attending: Radiation Oncology | Admitting: Radiation Oncology

## 2020-12-23 DIAGNOSIS — Z510 Encounter for antineoplastic radiation therapy: Secondary | ICD-10-CM | POA: Diagnosis not present

## 2020-12-26 ENCOUNTER — Ambulatory Visit
Admission: RE | Admit: 2020-12-26 | Discharge: 2020-12-26 | Disposition: A | Discharge status: Home / Self Care | Payer: 59 | Source: Ambulatory Visit | Attending: Radiation Oncology | Admitting: Radiation Oncology

## 2020-12-26 DIAGNOSIS — Z510 Encounter for antineoplastic radiation therapy: Secondary | ICD-10-CM | POA: Diagnosis not present

## 2020-12-27 ENCOUNTER — Other Ambulatory Visit: Payer: Self-pay

## 2020-12-27 ENCOUNTER — Ambulatory Visit
Admission: RE | Admit: 2020-12-27 | Discharge: 2020-12-27 | Disposition: A | Discharge status: Home / Self Care | Payer: 59 | Source: Ambulatory Visit | Attending: Radiation Oncology | Admitting: Radiation Oncology

## 2020-12-27 DIAGNOSIS — Z510 Encounter for antineoplastic radiation therapy: Secondary | ICD-10-CM | POA: Diagnosis not present

## 2020-12-28 ENCOUNTER — Ambulatory Visit
Admission: RE | Admit: 2020-12-28 | Discharge: 2020-12-28 | Disposition: A | Discharge status: Home / Self Care | Payer: 59 | Source: Ambulatory Visit | Attending: Radiation Oncology | Admitting: Radiation Oncology

## 2020-12-28 ENCOUNTER — Inpatient Hospital Stay: Payer: 59

## 2020-12-28 ENCOUNTER — Other Ambulatory Visit: Payer: Self-pay | Admitting: Student

## 2020-12-28 DIAGNOSIS — Z510 Encounter for antineoplastic radiation therapy: Secondary | ICD-10-CM | POA: Diagnosis not present

## 2020-12-28 DIAGNOSIS — C3411 Malignant neoplasm of upper lobe, right bronchus or lung: Secondary | ICD-10-CM

## 2020-12-28 LAB — CMP (CANCER CENTER ONLY)
ALT: 18 U/L (ref 0–44)
AST: 15 U/L (ref 15–41)
Albumin: 3.8 g/dL (ref 3.5–5.0)
Alkaline Phosphatase: 78 U/L (ref 38–126)
Anion gap: 10 (ref 5–15)
BUN: 13 mg/dL (ref 8–23)
CO2: 24 mmol/L (ref 22–32)
Calcium: 9.4 mg/dL (ref 8.9–10.3)
Chloride: 100 mmol/L (ref 98–111)
Creatinine: 1.16 mg/dL (ref 0.61–1.24)
GFR, Estimated: >60 mL/min (ref >60)
Glucose, Bld: 94 mg/dL (ref 70–99)
Potassium: 3.6 mmol/L (ref 3.5–5.1)
Sodium: 134 mmol/L — ABNORMAL LOW (ref 135–145)
Total Bilirubin: 0.9 mg/dL (ref 0.3–1.2)
Total Protein: 7.5 g/dL (ref 6.5–8.1)

## 2020-12-28 LAB — CBC WITH DIFFERENTIAL (CANCER CENTER ONLY)
Abs Immature Granulocytes: 0.01 10*3/uL (ref 0.00–0.07)
Basophils Absolute: 0 10*3/uL (ref 0.0–0.1)
Basophils Relative: 1 %
Eosinophils Absolute: 0 10*3/uL (ref 0.0–0.5)
Eosinophils Relative: 1 %
HCT: 35.3 % — ABNORMAL LOW (ref 39.0–52.0)
Hemoglobin: 11.6 g/dL — ABNORMAL LOW (ref 13.0–17.0)
Immature Granulocytes: 0 %
Lymphocytes Relative: 21 %
Lymphs Abs: 0.6 10*3/uL — ABNORMAL LOW (ref 0.7–4.0)
MCH: 26.4 pg (ref 26.0–34.0)
MCHC: 32.9 g/dL (ref 30.0–36.0)
MCV: 80.4 fL (ref 80.0–100.0)
Monocytes Absolute: 0.6 10*3/uL (ref 0.1–1.0)
Monocytes Relative: 22 %
Neutro Abs: 1.5 10*3/uL — ABNORMAL LOW (ref 1.7–7.7)
Neutrophils Relative %: 55 %
Platelet Count: 123 10*3/uL — ABNORMAL LOW (ref 150–400)
RBC: 4.39 MIL/uL (ref 4.22–5.81)
RDW: 14.3 % (ref 11.5–15.5)
WBC Count: 2.7 10*3/uL — ABNORMAL LOW (ref 4.0–10.5)
nRBC: 0 % (ref 0.0–0.2)

## 2020-12-28 LAB — TSH: TSH: 0.648 u[IU]/mL (ref 0.320–4.118)

## 2020-12-29 ENCOUNTER — Ambulatory Visit: Payer: 59

## 2020-12-29 ENCOUNTER — Other Ambulatory Visit: Payer: Self-pay

## 2020-12-29 ENCOUNTER — Ambulatory Visit
Admission: RE | Admit: 2020-12-29 | Discharge: 2020-12-29 | Disposition: A | Discharge status: Home / Self Care | Payer: 59 | Source: Ambulatory Visit | Attending: Radiation Oncology | Admitting: Radiation Oncology

## 2020-12-29 DIAGNOSIS — Z510 Encounter for antineoplastic radiation therapy: Secondary | ICD-10-CM | POA: Diagnosis not present

## 2020-12-30 ENCOUNTER — Ambulatory Visit (HOSPITAL_COMMUNITY)
Admission: RE | Admit: 2020-12-30 | Discharge: 2020-12-30 | Disposition: A | Discharge status: Home / Self Care | Payer: 59 | Source: Ambulatory Visit | Attending: Internal Medicine | Admitting: Internal Medicine

## 2020-12-30 ENCOUNTER — Encounter (HOSPITAL_COMMUNITY): Payer: Self-pay

## 2020-12-30 ENCOUNTER — Ambulatory Visit: Payer: 59

## 2020-12-30 DIAGNOSIS — I10 Essential (primary) hypertension: Secondary | ICD-10-CM | POA: Diagnosis not present

## 2020-12-30 DIAGNOSIS — Z87891 Personal history of nicotine dependence: Secondary | ICD-10-CM | POA: Insufficient documentation

## 2020-12-30 DIAGNOSIS — C3490 Malignant neoplasm of unspecified part of unspecified bronchus or lung: Secondary | ICD-10-CM | POA: Insufficient documentation

## 2020-12-30 DIAGNOSIS — C3411 Malignant neoplasm of upper lobe, right bronchus or lung: Secondary | ICD-10-CM

## 2020-12-30 HISTORY — PX: IR IMAGING GUIDED PORT INSERTION: IMG5740

## 2020-12-30 MED ORDER — LIDOCAINE-EPINEPHRINE (PF) 2 %-1:200000 IJ SOLN
INTRAMUSCULAR | Status: AC
  Filled 2020-12-30: qty 20

## 2020-12-30 MED ORDER — MIDAZOLAM HCL 2 MG/2ML IJ SOLN
INTRAMUSCULAR | Status: AC | PRN
  Administered 2020-12-30: .5 mg via INTRAVENOUS

## 2020-12-30 MED ORDER — MIDAZOLAM HCL 2 MG/2ML IJ SOLN
INTRAMUSCULAR | Status: AC | PRN
  Administered 2020-12-30: 1 mg via INTRAVENOUS

## 2020-12-30 MED ORDER — HEPARIN SOD (PORK) LOCK FLUSH 100 UNIT/ML IV SOLN
INTRAVENOUS | Status: AC
  Filled 2020-12-30: qty 5

## 2020-12-30 MED ORDER — FENTANYL CITRATE (PF) 100 MCG/2ML IJ SOLN
INTRAMUSCULAR | Status: AC
  Filled 2020-12-30: qty 2

## 2020-12-30 MED ORDER — FENTANYL CITRATE (PF) 100 MCG/2ML IJ SOLN
INTRAMUSCULAR | Status: AC | PRN
  Administered 2020-12-30: 25 ug via INTRAVENOUS

## 2020-12-30 MED ORDER — LIDOCAINE-EPINEPHRINE (PF) 2 %-1:200000 IJ SOLN
INTRAMUSCULAR | Status: AC | PRN
  Administered 2020-12-30: 10 mL via INTRADERMAL

## 2020-12-30 MED ORDER — MIDAZOLAM HCL 2 MG/2ML IJ SOLN
INTRAMUSCULAR | Status: AC
  Filled 2020-12-30: qty 4

## 2020-12-30 MED ORDER — SODIUM CHLORIDE 0.9 % IV SOLN: INTRAVENOUS | Status: DC

## 2020-12-30 MED ORDER — FENTANYL CITRATE (PF) 100 MCG/2ML IJ SOLN
INTRAMUSCULAR | Status: AC | PRN
  Administered 2020-12-30: 50 ug via INTRAVENOUS

## 2020-12-30 MED ORDER — HEPARIN SOD (PORK) LOCK FLUSH 100 UNIT/ML IV SOLN
INTRAVENOUS | Status: AC | PRN
  Administered 2020-12-30: 500 [IU] via INTRAVENOUS

## 2020-12-30 MED FILL — Dexamethasone Sodium Phosphate Inj 100 MG/10ML: INTRAMUSCULAR | Qty: 1 | Status: AC

## 2020-12-30 MED FILL — Fosaprepitant Dimeglumine For IV Infusion 150 MG (Base Eq): INTRAVENOUS | Qty: 5 | Status: AC

## 2020-12-30 NOTE — Discharge Instructions (Signed)
For questions /concerns may call Interventional Radiology at [phone]  You may remove your dressing and shower tomorrow afternoon  DO NOT use EMLA cream for 2 weeks after port placement as the cream will remove surgical glue on your incision.      Implanted Port Insertion, Care After This sheet gives you information about how to care for yourself after your procedure. Your health care provider may also give you more specific instructions. If you have problems or questions, contact your health careprovider. What can I expect after the procedure? After the procedure, it is common to have: Discomfort at the port insertion site. Bruising on the skin over the port. This should improve over 3-4 days. Follow these instructions at home: [facility] care After your port is placed, you will get a manufacturer's information card. The card has information about your port. Keep this card with you at all times. Take care of the port as told by your health care provider. Ask your health care provider if you or a family member can get training for taking care of the port at home. A home health care nurse may also take care of the port. Make sure to remember what type of port you have. Incision care Follow instructions from your health care provider about how to take care of your port insertion site. Make sure you: Wash your hands with soap and water before and after you change your bandage (dressing). If soap and water are not available, use hand sanitizer. Change your dressing as told by your health care provider. Leave skin glue, or adhesive strips in place. These skin closures may need to stay in place for 2 weeks or longer.  Check your port insertion site every day for signs of infection. Check for:      - Redness, swelling, or pain.                     - Fluid or blood.      - Warmth.      - Pus or a bad smell. Activity Return to your normal activities as told by your health care provider. Ask your  health care provider what activities are safe for you. Do not lift anything that is heavier than 10 lb (4.5 kg), or the limit that you are told, until your health care provider says that it is safe. General instructions Take over-the-counter and prescription medicines only as told by your health care provider. Do not take baths, swim, or use a hot tub until your health care provider approves. Ask your health care provider if you may take showers. You may only be allowed to take sponge baths. Do not drive for 24 hours if you were given a sedative during your procedure. Wear a medical alert bracelet in case of an emergency. This will tell any health care providers that you have a port. Keep all follow-up visits as told by your health care provider. This is important. Contact a health care provider if: You cannot flush your port with saline as directed, or you cannot draw blood from the port. You have a fever or chills. You have redness, swelling, or pain around your port insertion site. You have fluid or blood coming from your port insertion site. Your port insertion site feels warm to the touch. You have pus or a bad smell coming from the port insertion site. Get help right away if: You have chest pain or shortness of breath. You have bleeding from  your port that you cannot control. Summary Take care of the port as told by your health care provider. Keep the manufacturer's information card with you at all times. Change your dressing as told by your health care provider. Contact a health care provider if you have a fever or chills or if you have redness, swelling, or pain around your port insertion site. Keep all follow-up visits as told by your health care provider. This information is not intended to replace advice given to you by your health care provider. Make sure you discuss any questions you have with your healthcare provider.  Moderate Conscious Sedation, Adult, Care After This sheet  gives you information about how to care for yourself after your procedure. Your health care provider may also give you more specific instructions. If you have problems or questions, contact your health careprovider. What can I expect after the procedure? After the procedure, it is common to have: Sleepiness for several hours. Impaired judgment for several hours. Difficulty with balance. Vomiting if you eat too soon. Follow these instructions at home: For the time period you were told by your health care provider: Rest. Do not participate in activities where you could fall or become injured. Do not drive or use machinery. Do not drink alcohol. Do not take sleeping pills or medicines that cause drowsiness. Do not make important decisions or sign legal documents. Do not take care of children on your own. Eating and drinking  Follow the diet recommended by your health care provider. Drink enough fluid to keep your urine pale yellow. If you vomit: Drink water, juice, or soup when you can drink without vomiting. Make sure you have little or no nausea before eating solid foods.  General instructions Take over-the-counter and prescription medicines only as told by your health care provider. Have a responsible adult stay with you for the time you are told. It is important to have someone help care for you until you are awake and alert. Do not smoke. Keep all follow-up visits as told by your health care provider. This is important. Contact a health care provider if: You are still sleepy or having trouble with balance after 24 hours. You feel light-headed. You keep feeling nauseous or you keep vomiting. You develop a rash. You have a fever. You have redness or swelling around the IV site. Get help right away if: You have trouble breathing. You have new-onset confusion at home. Summary After the procedure, it is common to feel sleepy, have impaired judgment, or feel nauseous if you eat  too soon. Rest after you get home. Know the things you should not do after the procedure. Follow the diet recommended by your health care provider and drink enough fluid to keep your urine pale yellow. Get help right away if you have trouble breathing or new-onset confusion at home. This information is not intended to replace advice given to you by your health care provider. Make sure you discuss any questions you have with your healthcare provider. Document Revised: 04/24/2019 Document Reviewed: 11/20/2018 Elsevier Patient Education  2022 [employer].

## 2020-12-30 NOTE — Consult Note (Signed)
Chief Complaint: Patient was seen in consultation today for Port-A-Cath placement  Referring Physician(s): Mohamed,Mohamed  Supervising Physician: Michaelle Birks  Patient Status: [name] - Out-pt  History of Present Illness: Adam [name] is a 65 y.o. male former smoker with history of hypertension and metastatic small cell right lung cancer initially diagnosed in February of this year, status post chemoradiation. Recent follow-up imaging has revealed:   1. Definitive progression of disease as evidenced by new mass centered in the superior aspect of the right major fissure, widespread nodularity in the adjacent portion of the upper right lung, multiple new pleural based lesions in the right hemithorax, new middle mediastinal lymphadenopathy, new hypovascular lesion in the proximal body of the pancreas, and new lytic lesion in the parasymphyseal region of the left pubic bone, as detailed above. 2. Diffuse bronchial wall thickening with moderate centrilobular and paraseptal emphysema; imaging findings suggestive of underlying COPD. 3. Aortic atherosclerosis, in addition to left main and 3 vessel coronary artery disease. Please note that although the presence of coronary artery calcium documents the presence of coronary artery disease, the severity of this disease and any potential stenosis cannot be assessed on this non-gated CT examination  He presents today for Port-A-Cath placement to assist with additional treatment.  Past Medical History:  Diagnosis Date   Hypertension    Lung cancer ([place])    small cell RUL    Past Surgical History:  Procedure Laterality Date   BRONCHIAL BRUSHINGS  02/08/2020   Procedure: BRONCHIAL BRUSHINGS;  Surgeon: Freddi Starr, MD;  Location: [facility] ENDOSCOPY;  Service: Pulmonary;;   BRONCHIAL NEEDLE ASPIRATION BIOPSY  02/08/2020   Procedure: BRONCHIAL NEEDLE ASPIRATION BIOPSIES;  Surgeon: Freddi Starr, MD;  Location: [place] ENDOSCOPY;  Service:  Pulmonary;;   KNEE SURGERY Right    SHOULDER SURGERY Left    VIDEO BRONCHOSCOPY WITH ENDOBRONCHIAL ULTRASOUND N/A 02/08/2020   Procedure: VIDEO BRONCHOSCOPY WITH ENDOBRONCHIAL ULTRASOUND;  Surgeon: Freddi Starr, MD;  Location: [place];  Service: Pulmonary;  Laterality: N/A;    Allergies: Patient has no known allergies.  Medications: Prior to Admission medications   Medication Sig Start Date End Date Taking? Authorizing Provider  acetaminophen (TYLENOL) 325 MG tablet Take 650 mg by mouth every 6 (six) hours as needed.   Yes [provider]  amLODipine (NORVASC) 10 MG tablet Take 10 mg by mouth daily. 12/14/19  Yes [provider]  bisacodyl (DULCOLAX) 5 MG EC tablet Take 5 mg by mouth daily as needed for moderate constipation.   Yes [provider]  ibuprofen (ADVIL) 600 MG tablet Take 600 mg by mouth every 6 (six) hours as needed. 10/06/19  Yes [provider]  lisinopril-hydrochlorothiazide (ZESTORETIC) 20-12.5 MG tablet Take 1 tablet by mouth daily. 11/15/20  Yes de [place], [name], MD     Family History  Problem Relation Age of Onset   Heart disease Mother    Cancer Cousin    Breast cancer Neg Hx    Colon cancer Neg Hx    Pancreatic cancer Neg Hx    Prostate cancer Neg Hx     Social History   Socioeconomic History   Marital status: Divorced    Spouse name: [name]   Number of children: Not on file   Years of education: Not on file   Highest education level: Not on file  Occupational History   Not on file  Tobacco Use   Smoking status: Former    Packs/day: 1.00  Years: 20.00    Pack years: 20.00    Types: Cigarettes    Quit date: 06/09/2018    Years since quitting: 2.5   Smokeless tobacco: Never  Vaping Use   Vaping Use: Never used  Substance and Sexual Activity   Alcohol use: Not Currently   Drug use: Never   Sexual activity: Yes  Other Topics Concern   Not on file  Social History Narrative   Not on file    Social Determinants of Health   Financial Resource Strain: Not on file  Food Insecurity: Not on file  Transportation Needs: Not on file  Physical Activity: Not on file  Stress: Not on file  Social Connections: Not on file      Review of Systems denies fever, headache, chest pain, worsening dyspnea, cough, abdominal/back pain, nausea, vomiting or bleeding  Vital Signs: BP 115/82    Pulse (!) 110    Temp 98.4 F (36.9 C) (Oral)    Resp 18    Ht 5\' 9"  (1.753 m)    Wt 189 lb (85.7 kg)    SpO2 99%    BMI 27.91 kg/m   Physical Exam awake, alert.  Chest clear to auscultation bilaterally.  Heart with slightly tachycardic but regular rhythm.  Abdomen soft, positive bowel sounds, nontender.  No lower extremity edema  Imaging: No results found.  Labs:  CBC: Recent Labs    12/07/20 0904 12/13/20 1532 12/22/20 0833 12/28/20 1427  WBC 5.5 5.4 1.7* 2.7*  HGB 13.1 12.9* 10.4* 11.6*  HCT 40.3 39.5 32.7* 35.3*  PLT 210 237 159 123*    COAGS: Recent Labs    02/07/20 1104  INR 1.0    BMP: Recent Labs    12/07/20 0904 12/13/20 1532 12/22/20 0833 12/28/20 1427  NA 135 140 138 134*  K 3.4* 3.4* 3.5 3.6  CL 100 102 103 100  CO2 27 26 26 24   GLUCOSE 99 91 89 94  BUN 18 20 8 13   CALCIUM 10.3 10.1 9.3 9.4  CREATININE 1.02 1.18 0.83 1.16  GFRNONAA >60 >60 >60 >60    LIVER FUNCTION TESTS: Recent Labs    12/07/20 0904 12/13/20 1532 12/22/20 0833 12/28/20 1427  BILITOT 0.9 0.9 0.5 0.9  AST 24 20 25 15   ALT 23 19 34 18  ALKPHOS 79 85 71 78  PROT 7.9 7.7 6.7 7.5  ALBUMIN 4.3 3.7 3.3* 3.8    TUMOR MARKERS: No results for input(s): AFPTM, CEA, CA199, CHROMGRNA in the last 8760 hours.  Assessment and Plan: 65 y.o. male former smoker with history of hypertension and metastatic small cell right lung cancer initially diagnosed in February of this year, status post chemoradiation. Recent follow-up imaging has revealed:   1. Definitive progression of disease as  evidenced by new mass centered in the superior aspect of the right major fissure, widespread nodularity in the adjacent portion of the upper right lung, multiple new pleural based lesions in the right hemithorax, new middle mediastinal lymphadenopathy, new hypovascular lesion in the proximal body of the pancreas, and new lytic lesion in the parasymphyseal region of the left pubic bone, as detailed above. 2. Diffuse bronchial wall thickening with moderate centrilobular and paraseptal emphysema; imaging findings suggestive of underlying COPD. 3. Aortic atherosclerosis, in addition to left main and 3 vessel coronary artery disease. Please note that although the presence of coronary artery calcium documents the presence of coronary artery disease, the severity of this disease and any potential stenosis cannot be  assessed on this non-gated CT examination  He presents today for Port-A-Cath placement to assist with additional treatment.Risks and benefits of image guided port-a-catheter placement was discussed with the patient including, but not limited to bleeding, infection, pneumothorax, or fibrin sheath development and need for additional procedures.  All of the patient's questions were answered, patient is agreeable to proceed. Consent signed and in chart.      Thank you for this interesting consult.  I greatly enjoyed meeting PINCHAS REITHER and look forward to participating in their care.  A copy of this report was sent to the requesting provider on this date.  Electronically Signed: D. Rowe Robert, PA-C 12/30/2020, 11:56 AM   I spent a total of  25 minutes   in face to face in clinical consultation, greater than 50% of which was counseling/coordinating care for Port-A-Cath placement

## 2020-12-30 NOTE — Procedures (Signed)
Vascular and Interventional Radiology Procedure Note  Patient: Adam [name] DOB: [birth year] Medical Record Number: [id number] Note Date/Time: 12/30/20 4:53 PM   Performing Physician: Michaelle Birks, MD Assistant(s): None  Diagnosis: Lung cancer  Procedure: PORT PLACEMENT  Anesthesia: Conscious Sedation Complications: None Estimated Blood Loss:  3 mL  Findings:  Successful right-sided SL port placement, with the tip of the catheter in the proximal right atrium.  Plan: Catheter ready for use.  See detailed procedure note with images in PACS. The patient tolerated the procedure well without incident or complication and was returned to Recovery in stable condition.    Michaelle Birks, MD Vascular and Interventional Radiology Specialists [facility] Radiology   Pager. [place]

## 2021-01-03 ENCOUNTER — Ambulatory Visit
Admission: RE | Admit: 2021-01-03 | Discharge: 2021-01-03 | Disposition: A | Discharge status: Home / Self Care | Payer: 59 | Source: Ambulatory Visit | Attending: Radiation Oncology | Admitting: Radiation Oncology

## 2021-01-03 ENCOUNTER — Ambulatory Visit: Payer: 59

## 2021-01-03 ENCOUNTER — Encounter: Payer: Self-pay | Admitting: Urology

## 2021-01-03 ENCOUNTER — Other Ambulatory Visit: Payer: Self-pay

## 2021-01-03 ENCOUNTER — Inpatient Hospital Stay: Payer: 59

## 2021-01-03 DIAGNOSIS — C7951 Secondary malignant neoplasm of bone: Secondary | ICD-10-CM

## 2021-01-03 DIAGNOSIS — Z510 Encounter for antineoplastic radiation therapy: Secondary | ICD-10-CM | POA: Diagnosis not present

## 2021-01-03 DIAGNOSIS — C3411 Malignant neoplasm of upper lobe, right bronchus or lung: Secondary | ICD-10-CM

## 2021-01-03 LAB — CBC WITH DIFFERENTIAL (CANCER CENTER ONLY)
Abs Immature Granulocytes: 0.14 10*3/uL — ABNORMAL HIGH (ref 0.00–0.07)
Basophils Absolute: 0 10*3/uL (ref 0.0–0.1)
Basophils Relative: 1 %
Eosinophils Absolute: 0 10*3/uL (ref 0.0–0.5)
Eosinophils Relative: 0 %
HCT: 38.5 % — ABNORMAL LOW (ref 39.0–52.0)
Hemoglobin: 12.4 g/dL — ABNORMAL LOW (ref 13.0–17.0)
Immature Granulocytes: 2 %
Lymphocytes Relative: 4 %
Lymphs Abs: 0.3 10*3/uL — ABNORMAL LOW (ref 0.7–4.0)
MCH: 26.4 pg (ref 26.0–34.0)
MCHC: 32.2 g/dL (ref 30.0–36.0)
MCV: 82.1 fL (ref 80.0–100.0)
Monocytes Absolute: 1.1 10*3/uL — ABNORMAL HIGH (ref 0.1–1.0)
Monocytes Relative: 17 %
Neutro Abs: 5.1 10*3/uL (ref 1.7–7.7)
Neutrophils Relative %: 76 %
Platelet Count: 276 10*3/uL (ref 150–400)
RBC: 4.69 MIL/uL (ref 4.22–5.81)
RDW: 14.8 % (ref 11.5–15.5)
WBC Count: 6.7 10*3/uL (ref 4.0–10.5)
nRBC: 0 % (ref 0.0–0.2)

## 2021-01-03 LAB — CMP (CANCER CENTER ONLY)
ALT: 19 U/L (ref 0–44)
AST: 19 U/L (ref 15–41)
Albumin: 4.2 g/dL (ref 3.5–5.0)
Alkaline Phosphatase: 81 U/L (ref 38–126)
Anion gap: 13 (ref 5–15)
BUN: 10 mg/dL (ref 8–23)
CO2: 25 mmol/L (ref 22–32)
Calcium: 10.3 mg/dL (ref 8.9–10.3)
Chloride: 99 mmol/L (ref 98–111)
Creatinine: 1.08 mg/dL (ref 0.61–1.24)
GFR, Estimated: >60 mL/min (ref >60)
Glucose, Bld: 82 mg/dL (ref 70–99)
Potassium: 3.2 mmol/L — ABNORMAL LOW (ref 3.5–5.1)
Sodium: 137 mmol/L (ref 135–145)
Total Bilirubin: 0.5 mg/dL (ref 0.3–1.2)
Total Protein: 8.1 g/dL (ref 6.5–8.1)

## 2021-01-04 ENCOUNTER — Ambulatory Visit: Payer: 59

## 2021-01-04 ENCOUNTER — Inpatient Hospital Stay: Payer: 59

## 2021-01-04 ENCOUNTER — Ambulatory Visit (HOSPITAL_BASED_OUTPATIENT_CLINIC_OR_DEPARTMENT_OTHER): Payer: 59 | Admitting: Physician Assistant

## 2021-01-04 ENCOUNTER — Other Ambulatory Visit: Payer: Self-pay

## 2021-01-04 VITALS — BP 101/64 | HR 95 | Temp 98.3°F | Resp 18 | Wt 179.5 lb

## 2021-01-04 DIAGNOSIS — C3491 Malignant neoplasm of unspecified part of right bronchus or lung: Secondary | ICD-10-CM | POA: Diagnosis not present

## 2021-01-04 DIAGNOSIS — Z510 Encounter for antineoplastic radiation therapy: Secondary | ICD-10-CM | POA: Diagnosis not present

## 2021-01-04 DIAGNOSIS — C3411 Malignant neoplasm of upper lobe, right bronchus or lung: Secondary | ICD-10-CM

## 2021-01-04 DIAGNOSIS — W19XXXA Unspecified fall, initial encounter: Secondary | ICD-10-CM

## 2021-01-04 DIAGNOSIS — I959 Hypotension, unspecified: Secondary | ICD-10-CM | POA: Diagnosis not present

## 2021-01-04 LAB — TSH: TSH: 0.434 u[IU]/mL (ref 0.320–4.118)

## 2021-01-04 MED ORDER — SODIUM CHLORIDE 0.9 % IV SOLN: Freq: Once | INTRAVENOUS | Status: AC

## 2021-01-04 MED ORDER — HEPARIN SOD (PORK) LOCK FLUSH 100 UNIT/ML IV SOLN
500.0000 [IU] | Freq: Once | INTRAVENOUS | Status: AC
  Administered 2021-01-04: 16:00:00 500 [IU] via INTRAVENOUS

## 2021-01-04 MED ORDER — SODIUM CHLORIDE 0.9% FLUSH
10.0000 mL | Freq: Once | INTRAVENOUS | Status: AC
  Administered 2021-01-04: 16:00:00 10 mL via INTRAVENOUS

## 2021-01-04 MED FILL — Dexamethasone Sodium Phosphate Inj 100 MG/10ML: INTRAMUSCULAR | Qty: 1 | Status: AC

## 2021-01-04 NOTE — Progress Notes (Signed)
Patient arrived to the unit ambulatory using a cane,  VS right arm 84/47, HR 107, RR 18, 100% room air, temp 98.3.  BP retaken in left arm 82/51, HR 107.  Kaitlyn PA notified.  She states she will assess the patient.  Pt denies SOB, denies CP, denies racing heart.  Pt reports lightheadedness.  Per [place], [place] No treatment today,.   Give IVF as ordered.  Pt to follow up with Cassie.

## 2021-01-04 NOTE — Progress Notes (Signed)
Symptom Management Consult note [place]    Patient Care Team: de [place], Blondell Reveal, MD as PCP - General (Family Medicine) [name], RN (Inactive) as Registered Nurse    Name of the patient: Adam [name]  [id number]  1955/10/13   Date of visit: 01/04/2021    Chief complaint/ Reason for visit- hypotension, dizzy  Oncology History Overview Note  He is currently on the following trial. Please refer to my original note for complete history.  LIMITED STAGE SMALL CELL LUNG CANCER (LS-SCLC): A PHASE III  RANDOMIZED STUDY OF CHEMORADIATION VERSUS CHEMORADIATION PLUS ATEZOLIZUMAB (10-June-2019)  C1 D1 02/16/2020-02/18/2020 He has consented for the trial and is enrolled to the arm without immunotherapy. C2 D1-D3 03/15/2020-03/17/2020 C3D1 completed 04/05/2020 through 04/07/2020 C4D1 delayed by a week because of neutropenia. C4D1 completed 4/26-22-05/05/2020   Primary cancer of right upper lobe of lung ([place])  02/06/2020 Initial Diagnosis   Primary cancer of right upper lobe of lung ([place])   02/10/2020 Cancer Staging   Staging form: Lung, AJCC 8th Edition - Pathologic stage from 02/10/2020: No Stage Recommended (cT1c, cN2, cM0) - Signed by Benay Pike, MD on 02/10/2020 Histopathologic type: Small cell carcinoma, NOS Stage prefix: Initial diagnosis Histologic grade (G): GX Histologic grading system: 4 grade system    Small cell lung cancer, 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[place])  02/10/2020 Initial Diagnosis   Small cell lung cancer in adult ([place])   02/16/2020 - 02/18/2020 Chemotherapy          03/15/2020 - 05/05/2020 Chemotherapy   Patient is on Treatment Plan : LUNG SMALL CELL - LIMITED STAGE RESEARCH [id number] CISPLATIN D1 / ETOPOSIDE D1-3 ARM 1     12/14/2020 -  Chemotherapy   Patient is on Treatment Plan : LUNG SMALL CELL EXTENSIVE STAGE Durvalumab + Carboplatin D1 + Etoposide D1-3 q21d x 4 Cycles / Durvalumab q28d       Current Therapy: Second line systemic chemotherapy.   Last treatment was 12/16/2020 when patient received etoposide and trilaciclib dihydrochloride  Interval history- Adam [name] is a 65 yo male with oncologic history as listed above seen in infusion center today with chief complaint of hypotension and dizziness x2 days.  Patient states his symptoms have progressively worsened since onset.  He states he feels the room spinning when changing positions.  Has noted symptoms at rest.  He states the last time he ate was dinner yesterday evening, he did not have anything for breakfast this morning or drink any fluids.  He states yesterday he came to the cancer center for labs and had a mechanical fall in the parking lot.  He landed on his chest.  Denies hitting his head or loss of consciousness.  He had no prodrome of lightheadedness or dizziness.Marland Kitchen  He was able to get up with assistance and has been ambulatory since the fall.  He is walking with a cane to help prevent any further falls.  He denies any sick contacts.  Denies any fever, chills, headache, visual changes, neck pain, chest pain, abdominal pain, shortness of breath, nausea, emesis, urinary symptoms, diarrhea, rash, leg swelling, numbness, weakness or tingling.  Patient is not anticoagulated.  He reports he is on 2 blood pressure medications amlodipine and lisinopril-hydrochlorothiazide.  He did not take them this morning because when he checked his blood pressure at home it was low.  He admits to checking his blood pressure daily and holding off on medications when low.     ROS  All other systems are reviewed and are negative for acute change except as noted in the HPI.    No Known Allergies   Past Medical History:  Diagnosis Date   Hypertension    Lung cancer ([place])    small cell RUL     Past Surgical History:  Procedure Laterality Date   BRONCHIAL BRUSHINGS  02/08/2020   Procedure: BRONCHIAL BRUSHINGS;  Surgeon: Freddi Starr, MD;  Location: [facility] ENDOSCOPY;  Service: Pulmonary;;    BRONCHIAL NEEDLE ASPIRATION BIOPSY  02/08/2020   Procedure: BRONCHIAL NEEDLE ASPIRATION BIOPSIES;  Surgeon: Freddi Starr, MD;  Location: [place];  Service: Pulmonary;;   IR IMAGING GUIDED PORT INSERTION  12/30/2020   KNEE SURGERY Right    SHOULDER SURGERY Left    VIDEO BRONCHOSCOPY WITH ENDOBRONCHIAL ULTRASOUND N/A 02/08/2020   Procedure: VIDEO BRONCHOSCOPY WITH ENDOBRONCHIAL ULTRASOUND;  Surgeon: Freddi Starr, MD;  Location: [place];  Service: Pulmonary;  Laterality: N/A;    Social History   Socioeconomic History   Marital status: Divorced    Spouse name: [name]   Number of children: Not on file   Years of education: Not on file   Highest education level: Not on file  Occupational History   Not on file  Tobacco Use   Smoking status: Former    Packs/day: 1.00    Years: 20.00    Pack years: 20.00    Types: Cigarettes    Quit date: 06/09/2018    Years since quitting: 2.5   Smokeless tobacco: Never  Vaping Use   Vaping Use: Never used  Substance and Sexual Activity   Alcohol use: Not Currently   Drug use: Never   Sexual activity: Yes  Other Topics Concern   Not on file  Social History Narrative   Not on file   Social Determinants of Health   Financial Resource Strain: Not on file  Food Insecurity: Not on file  Transportation Needs: Not on file  Physical Activity: Not on file  Stress: Not on file  Social Connections: Not on file  Intimate Partner Violence: Not on file    Family History  Problem Relation Age of Onset   Heart disease Mother    Cancer Cousin    Breast cancer Neg Hx    Colon cancer Neg Hx    Pancreatic cancer Neg Hx    Prostate cancer Neg Hx      Current Outpatient Medications:    acetaminophen (TYLENOL) 325 MG tablet, Take 650 mg by mouth every 6 (six) hours as needed., Disp: , Rfl:    amLODipine (NORVASC) 10 MG tablet, Take 10 mg by mouth daily., Disp: , Rfl:    bisacodyl (DULCOLAX) 5 MG EC tablet, Take 5 mg by mouth daily  as needed for moderate constipation., Disp: , Rfl:    ibuprofen (ADVIL) 600 MG tablet, Take 600 mg by mouth every 6 (six) hours as needed., Disp: , Rfl:    lisinopril-hydrochlorothiazide (ZESTORETIC) 20-12.5 MG tablet, Take 1 tablet by mouth daily., Disp: 90 tablet, Rfl: 1  PHYSICAL EXAM: ECOG FS:1 - Symptomatic but completely ambulatory  T: 98.3    BP: 84/47     HR: 107    Resp: 18     O2: 100% on room air Physical Exam Vitals and nursing note reviewed.  Constitutional:      Appearance: He is well-developed. He is not ill-appearing or toxic-appearing.  HENT:     Head: Normocephalic and atraumatic.     Comments: No  tenderness to palpation of skull. No deformities or crepitus noted. No open wounds, abrasions or lacerations.      Right Ear: External ear normal.     Left Ear: External ear normal.     Nose: Nose normal.     Mouth/Throat:     Mouth: Mucous membranes are dry.  Eyes:     General: No scleral icterus.       Right eye: No discharge.        Left eye: No discharge.     Conjunctiva/sclera: Conjunctivae normal.  Neck:     Vascular: No JVD.  Cardiovascular:     Rate and Rhythm: Regular rhythm. Tachycardia present.     Pulses: Normal pulses.     Heart sounds: Normal heart sounds.  Pulmonary:     Effort: Pulmonary effort is normal.     Breath sounds: Normal breath sounds.  Chest:     Comments: Port in right upper chest without signs of infection.  No tenderness.  No bony tenderness of chest.  No flail chest or deformity on palpation, no signs of trauma Abdominal:     General: There is no distension.  Musculoskeletal:        General: Normal range of motion.     Cervical back: Normal range of motion.     Right lower leg: No edema.     Left lower leg: No edema.     Comments: Patient palpated from head to toe without any bony tenderness.  Moving all extremities without signs of injury.  Skin:    General: Skin is warm and dry.     Capillary Refill: Capillary refill takes  less than 2 seconds.  Neurological:     Mental Status: He is oriented to person, place, and time.     GCS: GCS eye subscore is 4. GCS verbal subscore is 5. GCS motor subscore is 6.     Comments: Speech is clear and goal oriented, follows commands CN III-XII intact, no facial droop Normal strength in upper and lower extremities bilaterally including dorsiflexion and plantar flexion, strong and equal grip strength Sensation normal to light and sharp touch Moves extremities without ataxia, coordination intact Normal finger to nose and rapid alternating movements Normal gait and balance  Psychiatric:        Behavior: Behavior normal.       LABORATORY DATA: I have reviewed the data as listed CBC Latest Ref Rng & Units 01/03/2021 12/28/2020 12/22/2020  WBC 4.0 - 10.5 K/uL 6.7 2.7(L) 1.7(L)  Hemoglobin 13.0 - 17.0 g/dL 12.4(L) 11.6(L) 10.4(L)  Hematocrit 39.0 - 52.0 % 38.5(L) 35.3(L) 32.7(L)  Platelets 150 - 400 K/uL 276 123(L) 159     CMP Latest Ref Rng & Units 01/03/2021 12/28/2020 12/22/2020  Glucose 70 - 99 mg/dL 82 94 89  BUN 8 - 23 mg/dL _0 Creatinine 0.61 - 1.24 mg/dL 1.08 1.16 0.83  Sodium 135 - 145 mmol/L 137 134(L) 138  Potassium 3.5 - 5.1 mmol/L 3.2(L) 3.6 3.5  Chloride 98 - 111 mmol/L 99 100 103  CO2 22 - 32 mmol/L _1 Calcium 8.9 - 10.3 mg/dL 10.3 9.4 9.3  Total Protein 6.5 - 8.1 g/dL 8.1 7.5 6.7  Total Bilirubin 0.3 - 1.2 mg/dL 0.5 0.9 0.5  Alkaline Phos 38 - 126 U/L 81 78 71  AST 15 - 41 U/L _2 ALT 0 - 44 U/L 19 18 34       RADIOGRAPHIC STUDIES:  I have personally reviewed the radiological images as listed and agreed with the findings in the report. No images are attached to the encounter. IR IMAGING GUIDED PORT INSERTION  Result Date: 12/30/2020 INDICATION: Lung cancer. EXAM: IMPLANTED PORT A CATH PLACEMENT WITH ULTRASOUND AND FLUOROSCOPIC GUIDANCE MEDICATIONS: The antibiotic was administered within an appropriate time interval prior to  skin puncture. ANESTHESIA/SEDATION: Moderate (conscious) sedation was employed during this procedure. A total of Versed 2 mg and Fentanyl 100 mcg was administered intravenously. Moderate Sedation Time: 28 minutes. The patient's level of consciousness and vital signs were monitored continuously by radiology nursing throughout the procedure under my direct supervision. FLUOROSCOPY TIME:  0 minutes, 35 seconds (1 mGy) COMPLICATIONS: None immediate. PROCEDURE: The procedure, risks, benefits, and alternatives were explained to the patient. Questions regarding the procedure were encouraged and answered. The patient understands and consents to the procedure. The neck and chest were prepped with chlorhexidine in a sterile fashion, and a sterile drape was applied covering the operative field. Maximum barrier sterile technique with sterile gowns and gloves were used for the procedure. A timeout was performed prior to the initiation of the procedure. Local anesthesia was provided with 1% lidocaine with epinephrine. After creating a small venotomy incision, a micropuncture kit was utilized to access the internal jugular vein under direct, real-time ultrasound guidance. Ultrasound image documentation was performed. The microwire was kinked to measure appropriate catheter length. A subcutaneous port pocket was then created along the upper chest wall utilizing a combination of sharp and blunt dissection. The pocket was irrigated with sterile saline. A single lumen ISP power injectable port was chosen for placement. The 8 Fr catheter was tunneled from the port pocket site to the venotomy incision. The port was placed in the pocket. The external catheter was trimmed to appropriate length. At the venotomy, an 8 Fr peel-away sheath was placed over a guidewire under fluoroscopic guidance. The catheter was then placed through the sheath and the sheath was removed. Final catheter positioning was confirmed and documented with a  fluoroscopic spot radiograph. The port was accessed with a Huber needle, aspirated and flushed with heparinized saline. The port pocket incision was closed with interrupted 3-0 Vicryl suture then Dermabond was applied, including at the venotomy incision. Dressings were placed. The patient tolerated the procedure well without immediate post procedural complication. IMPRESSION: Successful placement of a RIGHT internal jugular approach power injectable Port-A-Cath. The catheter tip is within RIGHT atrium. The catheter is ready for immediate use. Michaelle Birks, MD Vascular and Interventional Radiology Specialists [facility] Radiology Electronically Signed   By: Michaelle Birks M.D.   On: 12/30/2020 17:16     ASSESSMENT & PLAN: Patient is a 65 y.o. male with history of metastatic lung cancer followed by oncologist Dr. Earlie Server.  #) Hypotension-patient found to be hypotensive when vitals were checked prior to planned treatment today.  He is symptomatic with position changes.  He has a normal neuro exam.  He has had decreased p.o. intake over the last day and clinically looks dehydrated.  He has no infectious symptoms.  Patient given a liter of IV fluids and hypotension and tachycardia resolved.  He is no longer feeling lightheaded or dizzy.  Decision was made to hold off on treatment today as patient would rather go home and rest and continue to increase his p.o. intake.  This is reasonable as patient is undergoing palliative treatment and he is hesitant to start treatment after feeling poorly earlier today.  I viewed his labs from  yesterday.  CBC without leukocytosis, hemoglobin appears improved compared to baseline.  CMP shows mild hypokalemia with potassium of 3.2, no other significant electrolyte derangement, no renal insufficiency, no transaminitis.  TSH was normal.  #) Mechanical fall-patient suffered mechanical fall yesterday.  He has no signs of trauma on exam.  He denied any head injury or LOC or prodrome of  symptoms.  Engaged in shared decision-making with patient and he does not feel any imaging is needed at this time given lack of injury.  I feel this is reasonable.  #) Metastatic lung cancer-  I will inform oncologist of decision to hold off on treatment today so that further arrangements can be made. Oncologist was not in the office today.   Visit Diagnosis: 1. Hypotension, unspecified hypotension type   2. Metastatic lung cancer (metastasis from lung to other site), right ([place])   3. Fall, initial encounter      No orders of the defined types were placed in this encounter.   All questions were answered. The patient knows to call the clinic with any problems, questions or concerns. No barriers to learning was detected.  I have spent a total of 30 minutes minutes of face-to-face and non-face-to-face time, preparing to see the patient, obtaining and/or reviewing separately obtained history, performing a medically appropriate examination, counseling and educating the patient, ordering tests,  documenting clinical information in the electronic health record, and care coordination.     Thank you for allowing me to participate in the care of this patient.    Barrie Folk, PA-C Department of Hematology/Oncology [facility] Phone: [phone]  [phone]    01/04/2021 5:35 PM

## 2021-01-04 NOTE — Patient Instructions (Addendum)

## 2021-01-05 ENCOUNTER — Encounter: Payer: Self-pay | Admitting: Internal Medicine

## 2021-01-05 ENCOUNTER — Ambulatory Visit: Payer: 59

## 2021-01-06 ENCOUNTER — Ambulatory Visit: Payer: 59

## 2021-01-09 ENCOUNTER — Encounter: Payer: Self-pay | Admitting: Internal Medicine

## 2021-01-10 ENCOUNTER — Telehealth: Payer: Self-pay | Admitting: Internal Medicine

## 2021-01-10 ENCOUNTER — Other Ambulatory Visit: Payer: Self-pay

## 2021-01-10 ENCOUNTER — Ambulatory Visit: Payer: Self-pay | Admitting: Internal Medicine

## 2021-01-10 NOTE — Telephone Encounter (Signed)
Scheduled per 12/30 scheduled message, patient has been called and notified of all upcoming appointments.

## 2021-01-10 NOTE — Progress Notes (Signed)
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  PRIOR THERAPY:  Systemic chemotherapy according to phase 3 randomized clinical trial of chemoradiation versus chemoradiation plus atezolizumab status post 3 cycles.  This treatment was discontinued secondary to intolerance.  CURRENT THERAPY:  1) Second line systemic chemotherapy with carboplatin for AUC of 5 on day 1, etoposide 100 Mg/M2 on days 1, 2 and 3 as well as Imfinzi 1500 Mg IV on day 1 and Cosela 240 Mg/M2 before chemotherapy on days 1, 2 and 3 every 3 weeks.  First dose December 13, 2020.  Status post 1 cycle 2) palliative radiation to the left hip under the care of Dr. Tammi Klippel.  Last treatment on 01/03/2021  INTERVAL HISTORY: Adam [name] 66 y.o. male returns to the clinic today for a follow-up visit.  The patient was scheduled to begin cycle 2 of systemic chemotherapy last week; however, the patient had hypotension and dizziness on the day of infusion and treatment was delayed by 1 week.  The patient takes Norvasc, and lisinopril-HCTZ for blood pressure medication.  The patient was instructed to take his blood pressure in the morning and hold his blood pressure medication if his blood pressure is normal/low.  The patient has been doing this recently.  He checks his blood pressure this morning and did not take his blood pressure medication.  His blood pressure in the clinic today is acceptable at 128/93.  Since being seen last week, the patient is feeling farily well.  He acknowledges that he has not been eating and drinking like he should so he is trying to drink more water.  He reports dry mouth.  He does not drink any supplemental drinks such as boost or Ensure.  He is lost 12 pounds since his last appointment from 12/22/2020.     Since last being seen in the clinic, the patient completed palliative radiation to the left hip under the care of Dr. Tammi Klippel.  The patient's pain is somewhat better at this time but not gone completely.  The patient takes Tylenol with improvement of his symptoms.  He does not feel that he needs any Tylenol in the clinic at this time.  Otherwise the patient believes that he tolerated his first cycle well.  When asked, he states he had fatigue for "a day or so" after his first infusion.  The patient denies any fever, chills, or night sweats. He denies any chest pain, cough, shortness of breath, or hemoptysis.  Denies any nausea, vomiting, diarrhea, or constipation.  Denies any headache or visual changes.  Denies any rashes or skin changes.  The patient is here today for evaluation and repeat blood work before considering starting cycle #2.   MEDICAL HISTORY: Past Medical History:  Diagnosis Date   Hypertension    Lung cancer ([place])    small cell RUL    ALLERGIES:  has No Known Allergies.  MEDICATIONS:  Current Outpatient Medications  Medication Sig Dispense Refill   lidocaine-prilocaine (EMLA) cream Apply 1 application topically as needed. 30 g 2   acetaminophen (TYLENOL) 325 MG tablet Take 650 mg by mouth every 6 (six) hours as needed.     amLODipine (NORVASC) 10 MG tablet Take 10 mg by mouth daily.     bisacodyl (DULCOLAX) 5 MG EC tablet Take  5 mg by mouth daily as needed for moderate constipation.     ibuprofen (ADVIL) 600 MG tablet Take 600 mg by mouth every 6 (six) hours as needed.     lisinopril-hydrochlorothiazide (ZESTORETIC) 20-12.5 MG tablet Take 1 tablet by mouth daily. 90 tablet 1   No current facility-administered medications for this visit.    SURGICAL HISTORY:  Past Surgical History:  Procedure Laterality Date   BRONCHIAL BRUSHINGS  02/08/2020   Procedure: BRONCHIAL BRUSHINGS;  Surgeon: Freddi Starr, MD;  Location: [facility];  Service: Pulmonary;;   BRONCHIAL  NEEDLE ASPIRATION BIOPSY  02/08/2020   Procedure: BRONCHIAL NEEDLE ASPIRATION BIOPSIES;  Surgeon: Freddi Starr, MD;  Location: [facility];  Service: Pulmonary;;   IR IMAGING GUIDED PORT INSERTION  12/30/2020   KNEE SURGERY Right    SHOULDER SURGERY Left    VIDEO BRONCHOSCOPY WITH ENDOBRONCHIAL ULTRASOUND N/A 02/08/2020   Procedure: VIDEO BRONCHOSCOPY WITH ENDOBRONCHIAL ULTRASOUND;  Surgeon: Freddi Starr, MD;  Location: [facility];  Service: Pulmonary;  Laterality: N/A;    REVIEW OF SYSTEMS:   Review of Systems  Constitutional: Positive for decreased appetite and weight loss.  Negative for chills, fatigue, and fever.  HENT: Negative for mouth sores, nosebleeds, sore throat and trouble swallowing.   Eyes: Negative for eye problems and icterus.  Respiratory: Negative for cough, hemoptysis, shortness of breath and wheezing.   Cardiovascular: Negative for chest pain and leg swelling.  Gastrointestinal: Negative for abdominal pain, constipation, diarrhea, nausea and vomiting.  Genitourinary: Negative for bladder incontinence, difficulty urinating, dysuria, frequency and hematuria.   Musculoskeletal: Negative for back pain, gait problem, neck pain and neck stiffness.  Skin: Negative for itching and rash.  Neurological: Negative for dizziness, extremity weakness, gait problem, headaches, light-headedness and seizures.  Hematological: Negative for adenopathy. Does not bruise/bleed easily.  Psychiatric/Behavioral: Negative for confusion, depression and sleep disturbance. The patient is not nervous/anxious.     PHYSICAL EXAMINATION:  Blood pressure (!) 128/93, pulse (!) 109, temperature (!) 97 F (36.1 C), temperature source Temporal, resp. rate 17, height '5\' 9"'  (1.753 m), weight 179 lb 1.6 oz (81.2 kg), SpO2 100 %.  ECOG PERFORMANCE STATUS: 1-2  Physical Exam  Constitutional: Oriented to person, place, and time and thin appearing male and in no distress.   HENT:  Head:  Normocephalic and atraumatic.  Mouth/Throat: Oropharynx is clear and moist. No oropharyngeal exudate.  Eyes: Conjunctivae are normal. Right eye exhibits no discharge. Left eye exhibits no discharge. No scleral icterus.  Neck: Normal range of motion. Neck supple.  Cardiovascular: Normal rate, regular rhythm, normal heart sounds and intact distal pulses.   Pulmonary/Chest: Effort normal and breath sounds normal. No respiratory distress. No wheezes. No rales.  Abdominal: Soft. Bowel sounds are normal. Exhibits no distension and no mass. There is no tenderness.  Musculoskeletal: Normal range of motion. Exhibits no edema.  Lymphadenopathy:    No cervical adenopathy.  Neurological: Alert and oriented to person, place, and time. Exhibits normal muscle tone. Gait normal. Coordination normal.  Ambulates with a cane.  Skin: Skin is warm and dry. No rash noted. Not diaphoretic. No erythema. No pallor.  Psychiatric: Mood, memory and judgment normal.  Vitals reviewed.  LABORATORY DATA: Lab Results  Component Value Date   WBC 3.5 (L) 01/11/2021   HGB 11.3 (L) 01/11/2021   HCT 34.0 (L) 01/11/2021   MCV 79.8 (L) 01/11/2021   PLT 295 01/11/2021      Chemistry      Component Value Date/Time  NA 136 01/11/2021 1033   K 3.5 01/11/2021 1033   CL 100 01/11/2021 1033   CO2 27 01/11/2021 1033   BUN 10 01/11/2021 1033   CREATININE 0.92 01/11/2021 1033      Component Value Date/Time   CALCIUM 9.7 01/11/2021 1033   ALKPHOS 78 01/11/2021 1033   AST 25 01/11/2021 1033   ALT 23 01/11/2021 1033   BILITOT 0.4 01/11/2021 1033       RADIOGRAPHIC STUDIES:  IR IMAGING GUIDED PORT INSERTION  Result Date: 12/30/2020 INDICATION: Lung cancer. EXAM: IMPLANTED PORT A CATH PLACEMENT WITH ULTRASOUND AND FLUOROSCOPIC GUIDANCE MEDICATIONS: The antibiotic was administered within an appropriate time interval prior to skin puncture. ANESTHESIA/SEDATION: Moderate (conscious) sedation was employed during this  procedure. A total of Versed 2 mg and Fentanyl 100 mcg was administered intravenously. Moderate Sedation Time: 28 minutes. The patient's level of consciousness and vital signs were monitored continuously by radiology nursing throughout the procedure under my direct supervision. FLUOROSCOPY TIME:  0 minutes, 35 seconds (1 mGy) COMPLICATIONS: None immediate. PROCEDURE: The procedure, risks, benefits, and alternatives were explained to the patient. Questions regarding the procedure were encouraged and answered. The patient understands and consents to the procedure. The neck and chest were prepped with chlorhexidine in a sterile fashion, and a sterile drape was applied covering the operative field. Maximum barrier sterile technique with sterile gowns and gloves were used for the procedure. A timeout was performed prior to the initiation of the procedure. Local anesthesia was provided with 1% lidocaine with epinephrine. After creating a small venotomy incision, a micropuncture kit was utilized to access the internal jugular vein under direct, real-time ultrasound guidance. Ultrasound image documentation was performed. The microwire was kinked to measure appropriate catheter length. A subcutaneous port pocket was then created along the upper chest wall utilizing a combination of sharp and blunt dissection. The pocket was irrigated with sterile saline. A single lumen ISP power injectable port was chosen for placement. The 8 Fr catheter was tunneled from the port pocket site to the venotomy incision. The port was placed in the pocket. The external catheter was trimmed to appropriate length. At the venotomy, an 8 Fr peel-away sheath was placed over a guidewire under fluoroscopic guidance. The catheter was then placed through the sheath and the sheath was removed. Final catheter positioning was confirmed and documented with a fluoroscopic spot radiograph. The port was accessed with a Huber needle, aspirated and flushed with  heparinized saline. The port pocket incision was closed with interrupted 3-0 Vicryl suture then Dermabond was applied, including at the venotomy incision. Dressings were placed. The patient tolerated the procedure well without immediate post procedural complication. IMPRESSION: Successful placement of a RIGHT internal jugular approach power injectable Port-A-Cath. The catheter tip is within RIGHT atrium. The catheter is ready for immediate use. Michaelle Birks, MD [facility] Radiology Electronically Signed   By: Michaelle Birks M.D.   On: 12/30/2020 17:16     ASSESSMENT/PLAN:  This is a very pleasant 66 year old African-American male who was initially diagnosed with limited stage small cell lung cancer in February 2022.  The patient had evidence of disease recurrence and extensive stage small cell lung cancer and resumed treatment in December 2022  The patient initially underwent treatment with NRG LU005 clinical trial where the patient would be randomized to treatment with concurrent chemoradiation with cisplatin and/or etoposide versus concurrent chemoradiation with the same regimen in addition to atezolizumab. He is status post 3 cycles of  the treatment.  His treatment was discontinued secondary to intolerance and the patient had been in observation from April 2022-December 2022.  Repeat CT scan of the chest, abdomen, pelvis showed evidence of disease progression with a new mass centered in the superior aspect of the right major fissure, widespread nodularity in the adjacent portion of the upper right lung, multip new pleural-based lesions in the right hemithorax, new middle mediastinal/lymphadenopathy, new hypervascular lesion in the proximal body of the pancreas and a new lytic lesions in the parasymphyseal region of the left to pubic bone.  Since insurance declined a staging PET scan.  Therefore, Dr. Julien Nordmann started the patient on palliative systemic  chemotherapy with carboplatin for an AUC of 5 on day 1, etoposide 100 mg per metered squared on days 1, 2, and 3 with Cosela for myelosuppression in addition to immunotherapy with Imfinzi 1500 mg.  He status post 1 cycle.  He tolerated it fairly well except he had decreased p.o. intake and dehydration which required IV fluids.   The patient also completed palliative radiation to the left hip under the care of Dr. Tammi Klippel.  This was completed on 01/03/2021  I have reviewed the patient's treatment and dose with Dr. Julien Nordmann.  The patient feels that he tolerated cycle #1 well.  He is going to make a conscious effort to stay hydrated.  He is going to start drinking supplemental drinks such as boost or Ensure.  Encouraged him to eat high-calorie high-protein foods.  Discussed with him that if he is interested, we have a nutritionist in the clinic and happy to place a referral if he decides to do so.  He is trying to hydrate orally, however, he does report dry mouth.  He believes he would benefit from additional IV fluids today.  I will arrange the patient to receive 500 mL-1 L while in the infusion room today.  His labs are adequate for treatment.  Discussed with him that it is not uncommon for patients to have neutropenia with treatment.  The patient is on Cosela for myelo protection; however, it may not be a bad idea that when he comes into his appointments for his weekly labs to stay an extra 10 minutes until his CBC results to ensure that he does not need any type of short acting G-CSF support.  I asked him to notify our desk nursing team if he decides to stay for his lab results.   I continue to encourage him to monitor his blood pressure closely at home and hold his blood pressure medications if his blood pressure is normal/low.  See him back for follow-up visit in 3 weeks for evaluation before starting cycle #3.  I will arrange for restaging CT scan prior to starting his next cycle of treatment.  I  sent a prescription for Emla cream to his pharmacy since the patient has a Port-A-Cath.  The patient was advised to call immediately if he has any concerning symptoms in the interval. The patient voices understanding of current disease status and treatment options and is in agreement with the current care plan. All questions were answered. The patient knows to call the clinic with any problems, questions or concerns. We can certainly see the patient much sooner if necessary   Orders Placed This Encounter  Procedures   CT Chest W Contrast    Standing Status:   Future    Standing Expiration Date:   01/11/2022    Order Specific Question:   If indicated for  the ordered procedure, I authorize the administration of contrast media per Radiology protocol    Answer:   Yes    Order Specific Question:   Preferred imaging location?    Answer:   [facility]   CT Abdomen Pelvis W Contrast    Standing Status:   Future    Standing Expiration Date:   01/11/2022    Order Specific Question:   If indicated for the ordered procedure, I authorize the administration of contrast media per Radiology protocol    Answer:   Yes    Order Specific Question:   Preferred imaging location?    Answer:   [facility]    Order Specific Question:   Is Oral Contrast requested for this exam?    Answer:   Yes, Per Radiology protocol     The total time spent in the appointment was 20-29 minutes.   Ayub Kirsh L Cadie Sorci, PA-C 01/11/21

## 2021-01-11 ENCOUNTER — Inpatient Hospital Stay: Payer: Managed Care, Other (non HMO) | Attending: Hematology and Oncology

## 2021-01-11 ENCOUNTER — Other Ambulatory Visit: Payer: Self-pay

## 2021-01-11 ENCOUNTER — Other Ambulatory Visit: Payer: 59

## 2021-01-11 ENCOUNTER — Inpatient Hospital Stay: Payer: Managed Care, Other (non HMO)

## 2021-01-11 ENCOUNTER — Inpatient Hospital Stay (HOSPITAL_BASED_OUTPATIENT_CLINIC_OR_DEPARTMENT_OTHER): Payer: Managed Care, Other (non HMO) | Admitting: Physician Assistant

## 2021-01-11 VITALS — BP 128/93 | HR 109 | Temp 97.0°F | Resp 17 | Ht 69.0 in | Wt 179.1 lb

## 2021-01-11 VITALS — HR 99

## 2021-01-11 DIAGNOSIS — E860 Dehydration: Secondary | ICD-10-CM

## 2021-01-11 DIAGNOSIS — C3491 Malignant neoplasm of unspecified part of right bronchus or lung: Secondary | ICD-10-CM

## 2021-01-11 DIAGNOSIS — Z79899 Other long term (current) drug therapy: Secondary | ICD-10-CM | POA: Insufficient documentation

## 2021-01-11 DIAGNOSIS — C3411 Malignant neoplasm of upper lobe, right bronchus or lung: Secondary | ICD-10-CM | POA: Insufficient documentation

## 2021-01-11 DIAGNOSIS — Z5111 Encounter for antineoplastic chemotherapy: Secondary | ICD-10-CM

## 2021-01-11 DIAGNOSIS — I10 Essential (primary) hypertension: Secondary | ICD-10-CM | POA: Insufficient documentation

## 2021-01-11 DIAGNOSIS — Z95828 Presence of other vascular implants and grafts: Secondary | ICD-10-CM

## 2021-01-11 LAB — CMP (CANCER CENTER ONLY)
ALT: 23 U/L (ref 0–44)
AST: 25 U/L (ref 15–41)
Albumin: 3.7 g/dL (ref 3.5–5.0)
Alkaline Phosphatase: 78 U/L (ref 38–126)
Anion gap: 9 (ref 5–15)
BUN: 10 mg/dL (ref 8–23)
CO2: 27 mmol/L (ref 22–32)
Calcium: 9.7 mg/dL (ref 8.9–10.3)
Chloride: 100 mmol/L (ref 98–111)
Creatinine: 0.92 mg/dL (ref 0.61–1.24)
GFR, Estimated: >60 mL/min (ref >60)
Glucose, Bld: 105 mg/dL — ABNORMAL HIGH (ref 70–99)
Potassium: 3.5 mmol/L (ref 3.5–5.1)
Sodium: 136 mmol/L (ref 135–145)
Total Bilirubin: 0.4 mg/dL (ref 0.3–1.2)
Total Protein: 7.3 g/dL (ref 6.5–8.1)

## 2021-01-11 LAB — CBC WITH DIFFERENTIAL (CANCER CENTER ONLY)
Abs Immature Granulocytes: 0.03 10*3/uL (ref 0.00–0.07)
Basophils Absolute: 0 10*3/uL (ref 0.0–0.1)
Basophils Relative: 1 %
Eosinophils Absolute: 0.1 10*3/uL (ref 0.0–0.5)
Eosinophils Relative: 2 %
HCT: 34 % — ABNORMAL LOW (ref 39.0–52.0)
Hemoglobin: 11.3 g/dL — ABNORMAL LOW (ref 13.0–17.0)
Immature Granulocytes: 1 %
Lymphocytes Relative: 15 %
Lymphs Abs: 0.5 10*3/uL — ABNORMAL LOW (ref 0.7–4.0)
MCH: 26.5 pg (ref 26.0–34.0)
MCHC: 33.2 g/dL (ref 30.0–36.0)
MCV: 79.8 fL — ABNORMAL LOW (ref 80.0–100.0)
Monocytes Absolute: 0.5 10*3/uL (ref 0.1–1.0)
Monocytes Relative: 15 %
Neutro Abs: 2.3 10*3/uL (ref 1.7–7.7)
Neutrophils Relative %: 66 %
Platelet Count: 295 10*3/uL (ref 150–400)
RBC: 4.26 MIL/uL (ref 4.22–5.81)
RDW: 14.8 % (ref 11.5–15.5)
WBC Count: 3.5 10*3/uL — ABNORMAL LOW (ref 4.0–10.5)
nRBC: 0 % (ref 0.0–0.2)

## 2021-01-11 LAB — TSH: TSH: 0.704 u[IU]/mL (ref 0.320–4.118)

## 2021-01-11 MED ORDER — LIDOCAINE-PRILOCAINE 2.5-2.5 % EX CREA: 1.0000 "application " | TOPICAL_CREAM | CUTANEOUS | 2 refills | Status: AC | PRN

## 2021-01-11 MED ORDER — SODIUM CHLORIDE 0.9% FLUSH
10.0000 mL | INTRAVENOUS | Status: DC | PRN
  Administered 2021-01-11: 10 mL

## 2021-01-11 MED ORDER — SODIUM CHLORIDE 0.9 % IV SOLN
10.0000 mg | Freq: Once | INTRAVENOUS | Status: AC
  Administered 2021-01-11: 10 mg via INTRAVENOUS
  Filled 2021-01-11: qty 10

## 2021-01-11 MED ORDER — TRILACICLIB DIHYDROCHLORIDE INJECTION 300 MG
240.0000 mg/m2 | Freq: Once | INTRAVENOUS | Status: AC
  Administered 2021-01-11: 480 mg via INTRAVENOUS
  Filled 2021-01-11: qty 32

## 2021-01-11 MED ORDER — SODIUM CHLORIDE 0.9 % IV SOLN
510.0000 mg | Freq: Once | INTRAVENOUS | Status: AC
  Administered 2021-01-11: 510 mg via INTRAVENOUS
  Filled 2021-01-11: qty 51

## 2021-01-11 MED ORDER — SODIUM CHLORIDE 0.9% FLUSH
10.0000 mL | INTRAVENOUS | Status: AC | PRN
  Administered 2021-01-11: 10 mL

## 2021-01-11 MED ORDER — SODIUM CHLORIDE 0.9 % IV SOLN
100.0000 mg/m2 | Freq: Once | INTRAVENOUS | Status: AC
  Administered 2021-01-11: 200 mg via INTRAVENOUS
  Filled 2021-01-11: qty 10

## 2021-01-11 MED ORDER — SODIUM CHLORIDE 0.9 % IV SOLN
150.0000 mg | Freq: Once | INTRAVENOUS | Status: AC
  Administered 2021-01-11: 150 mg via INTRAVENOUS
  Filled 2021-01-11: qty 150

## 2021-01-11 MED ORDER — HEPARIN SOD (PORK) LOCK FLUSH 100 UNIT/ML IV SOLN
500.0000 [IU] | Freq: Once | INTRAVENOUS | Status: AC | PRN
  Administered 2021-01-11: 500 [IU]

## 2021-01-11 MED ORDER — SODIUM CHLORIDE 0.9 % IV SOLN
1500.0000 mg | Freq: Once | INTRAVENOUS | Status: AC
  Administered 2021-01-11: 1500 mg via INTRAVENOUS
  Filled 2021-01-11: qty 30

## 2021-01-11 MED ORDER — SODIUM CHLORIDE 0.9 % IV SOLN: Freq: Once | INTRAVENOUS | Status: AC

## 2021-01-11 MED ORDER — PALONOSETRON HCL INJECTION 0.25 MG/5ML
0.2500 mg | Freq: Once | INTRAVENOUS | Status: AC
  Administered 2021-01-11: 0.25 mg via INTRAVENOUS
  Filled 2021-01-11: qty 5

## 2021-01-11 MED FILL — Dexamethasone Sodium Phosphate Inj 100 MG/10ML: INTRAMUSCULAR | Qty: 1 | Status: AC

## 2021-01-11 NOTE — Progress Notes (Signed)
Maintain carboplatin dose at 510 mg despite improvement in scr.  T.O. Dr Mohamed/Marty Sadlowski Ronnald Ramp, PharmD

## 2021-01-11 NOTE — Patient Instructions (Signed)
[facility]COL[facility]  Discharge Instructions: Thank you for choosing [facility] to provide your oncology and hematology care.   If you have a lab appointment with the [facility], please go directly to the [facility] and check in at the registration area.   Wear comfortable clothing and clothing appropriate for easy access to any Portacath or PICC line.   We strive to give you quality time with your provider. You may need to reschedule your appointment if you arrive late (15 or more minutes).  Arriving late affects you and other patients whose appointments are after yours.  Also, if you miss three or more appointments without notifying the office, you may be dismissed from the clinic at the providers discretion.      For prescription refill requests, have your pharmacy contact our office and allow 72 hours for refills to be completed.    Today you received the following chemotherapy and/or immunotherapy agents Imfinzi, carboplatin and etoposide      To help prevent nausea and vomiting after your treatment, we encourage you to take your nausea medication as directed.  BELOW ARE SYMPTOMS THAT SHOULD BE REPORTED IMMEDIATELY: *FEVER GREATER THAN 100.4 F (38 C) OR HIGHER *CHILLS OR SWEATING *NAUSEA AND VOMITING THAT IS NOT CONTROLLED WITH YOUR NAUSEA MEDICATION *UNUSUAL SHORTNESS OF BREATH *UNUSUAL BRUISING OR BLEEDING *URINARY PROBLEMS (pain or burning when urinating, or frequent urination) *BOWEL PROBLEMS (unusual diarrhea, constipation, pain near the anus) TENDERNESS IN MOUTH AND THROAT WITH OR WITHOUT PRESENCE OF ULCERS (sore throat, sores in mouth, or a toothache) UNUSUAL RASH, SWELLING OR PAIN  UNUSUAL VAGINAL DISCHARGE OR ITCHING   Items with * indicate a potential emergency and should be followed up as soon as possible or go to the Emergency Department if any problems should occur.  Please show the CHEMOTHERAPY ALERT CARD or IMMUNOTHERAPY  ALERT CARD at check-in to the Emergency Department and triage nurse.  Should you have questions after your visit or need to cancel or reschedule your appointment, please contact [facility]  Dept: [phone]  and follow the prompts.  Office hours are 8:00 a.m. to 4:30 p.m. Monday - Friday. Please note that voicemails left after 4:00 p.m. may not be returned until the following business day.  We are closed weekends and major holidays. You have access to a nurse at all times for urgent questions. Please call the main number to the clinic Dept: [phone] and follow the prompts.   For any non-urgent questions, you may also contact your provider using MyChart. We now offer e-Visits for anyone 4 and older to request care online for non-urgent symptoms. For details visit [email or web address].   Also download the MyChart app! Go to the app store, search "MyChart", open the app, select [facility], and log in with your MyChart username and password.  Due to Covid, a mask is required upon entering the hospital/clinic. If you do not have a mask, one will be given to you upon arrival. For doctor visits, patients may have 1 support person aged 44 or older with them. For treatment visits, patients cannot have anyone with them due to current Covid guidelines and our immunocompromised population.

## 2021-01-12 ENCOUNTER — Inpatient Hospital Stay: Payer: Managed Care, Other (non HMO)

## 2021-01-12 ENCOUNTER — Ambulatory Visit: Payer: Managed Care, Other (non HMO)

## 2021-01-12 VITALS — BP 115/79 | HR 109 | Temp 98.2°F | Resp 17

## 2021-01-12 DIAGNOSIS — C3411 Malignant neoplasm of upper lobe, right bronchus or lung: Secondary | ICD-10-CM

## 2021-01-12 MED ORDER — SODIUM CHLORIDE 0.9 % IV SOLN: Freq: Once | INTRAVENOUS | Status: AC

## 2021-01-12 MED ORDER — SODIUM CHLORIDE 0.9 % IV SOLN
100.0000 mg/m2 | Freq: Once | INTRAVENOUS | Status: AC
  Administered 2021-01-12: 200 mg via INTRAVENOUS
  Filled 2021-01-12: qty 10

## 2021-01-12 MED ORDER — SODIUM CHLORIDE 0.9% FLUSH
10.0000 mL | INTRAVENOUS | Status: DC | PRN
  Administered 2021-01-12: 10 mL

## 2021-01-12 MED ORDER — TRILACICLIB DIHYDROCHLORIDE INJECTION 300 MG
240.0000 mg/m2 | Freq: Once | INTRAVENOUS | Status: AC
  Administered 2021-01-12: 480 mg via INTRAVENOUS
  Filled 2021-01-12: qty 32

## 2021-01-12 MED ORDER — HEPARIN SOD (PORK) LOCK FLUSH 100 UNIT/ML IV SOLN
500.0000 [IU] | Freq: Once | INTRAVENOUS | Status: AC | PRN
  Administered 2021-01-12: 500 [IU]

## 2021-01-12 MED ORDER — SODIUM CHLORIDE 0.9 % IV SOLN
10.0000 mg | Freq: Once | INTRAVENOUS | Status: AC
  Administered 2021-01-12: 10 mg via INTRAVENOUS
  Filled 2021-01-12: qty 10
  Filled 2021-01-12 (×2): qty 1

## 2021-01-12 MED FILL — Dexamethasone Sodium Phosphate Inj 100 MG/10ML: INTRAMUSCULAR | Qty: 1 | Status: AC

## 2021-01-12 NOTE — Patient Instructions (Signed)
[facility]COL[facility]  Discharge Instructions: Thank you for choosing [facility] to provide your oncology and hematology care.   If you have a lab appointment with the [facility], please go directly to the [facility] and check in at the registration area.   Wear comfortable clothing and clothing appropriate for easy access to any Portacath or PICC line.   We strive to give you quality time with your provider. You may need to reschedule your appointment if you arrive late (15 or more minutes).  Arriving late affects you and other patients whose appointments are after yours.  Also, if you miss three or more appointments without notifying the office, you may be dismissed from the clinic at the providers discretion.      For prescription refill requests, have your pharmacy contact our office and allow 72 hours for refills to be completed.    Today you received the following chemotherapy and/or immunotherapy agents: Etoposide.      To help prevent nausea and vomiting after your treatment, we encourage you to take your nausea medication as directed.  BELOW ARE SYMPTOMS THAT SHOULD BE REPORTED IMMEDIATELY: *FEVER GREATER THAN 100.4 F (38 C) OR HIGHER *CHILLS OR SWEATING *NAUSEA AND VOMITING THAT IS NOT CONTROLLED WITH YOUR NAUSEA MEDICATION *UNUSUAL SHORTNESS OF BREATH *UNUSUAL BRUISING OR BLEEDING *URINARY PROBLEMS (pain or burning when urinating, or frequent urination) *BOWEL PROBLEMS (unusual diarrhea, constipation, pain near the anus) TENDERNESS IN MOUTH AND THROAT WITH OR WITHOUT PRESENCE OF ULCERS (sore throat, sores in mouth, or a toothache) UNUSUAL RASH, SWELLING OR PAIN  UNUSUAL VAGINAL DISCHARGE OR ITCHING   Items with * indicate a potential emergency and should be followed up as soon as possible or go to the Emergency Department if any problems should occur.  Please show the CHEMOTHERAPY ALERT CARD or IMMUNOTHERAPY ALERT CARD at check-in to  the Emergency Department and triage nurse.  Should you have questions after your visit or need to cancel or reschedule your appointment, please contact [facility]  Dept: [phone]  and follow the prompts.  Office hours are 8:00 a.m. to 4:30 p.m. Monday - Friday. Please note that voicemails left after 4:00 p.m. may not be returned until the following business day.  We are closed weekends and major holidays. You have access to a nurse at all times for urgent questions. Please call the main number to the clinic Dept: [phone] and follow the prompts.   For any non-urgent questions, you may also contact your provider using MyChart. We now offer e-Visits for anyone 47 and older to request care online for non-urgent symptoms. For details visit [email or web address].   Also download the MyChart app! Go to the app store, search "MyChart", open the app, select [facility], and log in with your MyChart username and password.  Due to Covid, a mask is required upon entering the hospital/clinic. If you do not have a mask, one will be given to you upon arrival. For doctor visits, patients may have 1 support person aged 48 or older with them. For treatment visits, patients cannot have anyone with them due to current Covid guidelines and our immunocompromised population.

## 2021-01-12 NOTE — Progress Notes (Signed)
Per Cassie Heilingoetter, PA okay to treat with elevated HR.

## 2021-01-13 ENCOUNTER — Inpatient Hospital Stay: Payer: Managed Care, Other (non HMO)

## 2021-01-13 ENCOUNTER — Ambulatory Visit: Payer: Managed Care, Other (non HMO)

## 2021-01-13 ENCOUNTER — Other Ambulatory Visit: Payer: Self-pay

## 2021-01-13 VITALS — BP 103/69 | HR 108 | Temp 98.2°F | Resp 18

## 2021-01-13 DIAGNOSIS — C3411 Malignant neoplasm of upper lobe, right bronchus or lung: Secondary | ICD-10-CM

## 2021-01-13 MED ORDER — SODIUM CHLORIDE 0.9 % IV SOLN
10.0000 mg | Freq: Once | INTRAVENOUS | Status: AC
  Administered 2021-01-13: 10 mg via INTRAVENOUS
  Filled 2021-01-13: qty 10

## 2021-01-13 MED ORDER — TRILACICLIB DIHYDROCHLORIDE INJECTION 300 MG
240.0000 mg/m2 | Freq: Once | INTRAVENOUS | Status: AC
  Administered 2021-01-13: 480 mg via INTRAVENOUS
  Filled 2021-01-13: qty 32

## 2021-01-13 MED ORDER — SODIUM CHLORIDE 0.9 % IV SOLN
100.0000 mg/m2 | Freq: Once | INTRAVENOUS | Status: AC
  Administered 2021-01-13: 200 mg via INTRAVENOUS
  Filled 2021-01-13: qty 10

## 2021-01-13 MED ORDER — SODIUM CHLORIDE 0.9 % IV SOLN: Freq: Once | INTRAVENOUS | Status: AC

## 2021-01-13 NOTE — Patient Instructions (Signed)
[facility]  Discharge Instructions: Thank you for choosing [facility] to provide your oncology and hematology care.   If you have a lab appointment with the [facility], please go directly to the [facility] and check in at the registration area.   Wear comfortable clothing and clothing appropriate for easy access to any Portacath or PICC line.   We strive to give you quality time with your provider. You may need to reschedule your appointment if you arrive late (15 or more minutes).  Arriving late affects you and other patients whose appointments are after yours.  Also, if you miss three or more appointments without notifying the office, you may be dismissed from the clinic at the providers discretion.      For prescription refill requests, have your pharmacy contact our office and allow 72 hours for refills to be completed.    Today you received the following chemotherapy and/or immunotherapy agents: Etoposide.      To help prevent nausea and vomiting after your treatment, we encourage you to take your nausea medication as directed.  BELOW ARE SYMPTOMS THAT SHOULD BE REPORTED IMMEDIATELY: *FEVER GREATER THAN 100.4 F (38 C) OR HIGHER *CHILLS OR SWEATING *NAUSEA AND VOMITING THAT IS NOT CONTROLLED WITH YOUR NAUSEA MEDICATION *UNUSUAL SHORTNESS OF BREATH *UNUSUAL BRUISING OR BLEEDING *URINARY PROBLEMS (pain or burning when urinating, or frequent urination) *BOWEL PROBLEMS (unusual diarrhea, constipation, pain near the anus) TENDERNESS IN MOUTH AND THROAT WITH OR WITHOUT PRESENCE OF ULCERS (sore throat, sores in mouth, or a toothache) UNUSUAL RASH, SWELLING OR PAIN  UNUSUAL VAGINAL DISCHARGE OR ITCHING   Items with * indicate a potential emergency and should be followed up as soon as possible or go to the Emergency Department if any problems should occur.  Please show the CHEMOTHERAPY ALERT CARD or IMMUNOTHERAPY ALERT CARD at check-in to  the Emergency Department and triage nurse.  Should you have questions after your visit or need to cancel or reschedule your appointment, please contact [facility]  Dept: [phone]  and follow the prompts.  Office hours are 8:00 a.m. to 4:30 p.m. Monday - Friday. Please note that voicemails left after 4:00 p.m. may not be returned until the following business day.  We are closed weekends and major holidays. You have access to a nurse at all times for urgent questions. Please call the main number to the clinic Dept: [phone] and follow the prompts.   For any non-urgent questions, you may also contact your provider using MyChart. We now offer e-Visits for anyone 64 and older to request care online for non-urgent symptoms. For details visit [email or web address].   Also download the MyChart app! Go to the app store, search "MyChart", open the app, select [facility], and log in with your MyChart username and password.  Due to Covid, a mask is required upon entering the hospital/clinic. If you do not have a mask, one will be given to you upon arrival. For doctor visits, patients may have 1 support person aged 64 or older with them. For treatment visits, patients cannot have anyone with them due to current Covid guidelines and our immunocompromised population.

## 2021-01-18 ENCOUNTER — Inpatient Hospital Stay: Payer: Managed Care, Other (non HMO)

## 2021-01-18 ENCOUNTER — Other Ambulatory Visit: Payer: 59

## 2021-01-18 ENCOUNTER — Other Ambulatory Visit: Payer: Self-pay

## 2021-01-18 DIAGNOSIS — C3411 Malignant neoplasm of upper lobe, right bronchus or lung: Secondary | ICD-10-CM | POA: Diagnosis not present

## 2021-01-18 LAB — CBC WITH DIFFERENTIAL (CANCER CENTER ONLY)
Abs Immature Granulocytes: 0.03 10*3/uL (ref 0.00–0.07)
Basophils Absolute: 0 10*3/uL (ref 0.0–0.1)
Basophils Relative: 1 %
Eosinophils Absolute: 0.1 10*3/uL (ref 0.0–0.5)
Eosinophils Relative: 4 %
HCT: 30.4 % — ABNORMAL LOW (ref 39.0–52.0)
Hemoglobin: 9.9 g/dL — ABNORMAL LOW (ref 13.0–17.0)
Immature Granulocytes: 1 %
Lymphocytes Relative: 13 %
Lymphs Abs: 0.4 10*3/uL — ABNORMAL LOW (ref 0.7–4.0)
MCH: 26.5 pg (ref 26.0–34.0)
MCHC: 32.6 g/dL (ref 30.0–36.0)
MCV: 81.5 fL (ref 80.0–100.0)
Monocytes Absolute: 0.1 10*3/uL (ref 0.1–1.0)
Monocytes Relative: 4 %
Neutro Abs: 2.1 10*3/uL (ref 1.7–7.7)
Neutrophils Relative %: 77 %
Platelet Count: 222 10*3/uL (ref 150–400)
RBC: 3.73 MIL/uL — ABNORMAL LOW (ref 4.22–5.81)
RDW: 15.3 % (ref 11.5–15.5)
WBC Count: 2.7 10*3/uL — ABNORMAL LOW (ref 4.0–10.5)
nRBC: 0 % (ref 0.0–0.2)

## 2021-01-18 LAB — CMP (CANCER CENTER ONLY)
ALT: 24 U/L (ref 0–44)
AST: 18 U/L (ref 15–41)
Albumin: 3.3 g/dL — ABNORMAL LOW (ref 3.5–5.0)
Alkaline Phosphatase: 64 U/L (ref 38–126)
Anion gap: 8 (ref 5–15)
BUN: 14 mg/dL (ref 8–23)
CO2: 27 mmol/L (ref 22–32)
Calcium: 9.3 mg/dL (ref 8.9–10.3)
Chloride: 104 mmol/L (ref 98–111)
Creatinine: 0.87 mg/dL (ref 0.61–1.24)
GFR, Estimated: >60 mL/min (ref >60)
Glucose, Bld: 101 mg/dL — ABNORMAL HIGH (ref 70–99)
Potassium: 3.5 mmol/L (ref 3.5–5.1)
Sodium: 139 mmol/L (ref 135–145)
Total Bilirubin: 0.7 mg/dL (ref 0.3–1.2)
Total Protein: 7.1 g/dL (ref 6.5–8.1)

## 2021-01-19 LAB — TSH: TSH: 1.064 u[IU]/mL (ref 0.320–4.118)

## 2021-01-24 ENCOUNTER — Other Ambulatory Visit: Payer: 59

## 2021-01-24 ENCOUNTER — Ambulatory Visit: Payer: 59

## 2021-01-24 ENCOUNTER — Ambulatory Visit: Payer: 59 | Admitting: Internal Medicine

## 2021-01-24 ENCOUNTER — Other Ambulatory Visit: Payer: Managed Care, Other (non HMO)

## 2021-01-25 ENCOUNTER — Inpatient Hospital Stay: Payer: Managed Care, Other (non HMO)

## 2021-01-25 ENCOUNTER — Ambulatory Visit: Payer: 59

## 2021-01-25 ENCOUNTER — Other Ambulatory Visit: Payer: Self-pay

## 2021-01-25 DIAGNOSIS — C3411 Malignant neoplasm of upper lobe, right bronchus or lung: Secondary | ICD-10-CM

## 2021-01-25 DIAGNOSIS — Z95828 Presence of other vascular implants and grafts: Secondary | ICD-10-CM

## 2021-01-25 LAB — CBC WITH DIFFERENTIAL (CANCER CENTER ONLY)
Abs Immature Granulocytes: 0 10*3/uL (ref 0.00–0.07)
Basophils Absolute: 0 10*3/uL (ref 0.0–0.1)
Basophils Relative: 1 %
Eosinophils Absolute: 0.1 10*3/uL (ref 0.0–0.5)
Eosinophils Relative: 4 %
HCT: 32 % — ABNORMAL LOW (ref 39.0–52.0)
Hemoglobin: 10.4 g/dL — ABNORMAL LOW (ref 13.0–17.0)
Immature Granulocytes: 0 %
Lymphocytes Relative: 26 %
Lymphs Abs: 0.5 10*3/uL — ABNORMAL LOW (ref 0.7–4.0)
MCH: 26.8 pg (ref 26.0–34.0)
MCHC: 32.5 g/dL (ref 30.0–36.0)
MCV: 82.5 fL (ref 80.0–100.0)
Monocytes Absolute: 0.4 10*3/uL (ref 0.1–1.0)
Monocytes Relative: 19 %
Neutro Abs: 1 10*3/uL — ABNORMAL LOW (ref 1.7–7.7)
Neutrophils Relative %: 50 %
Platelet Count: 143 10*3/uL — ABNORMAL LOW (ref 150–400)
RBC: 3.88 MIL/uL — ABNORMAL LOW (ref 4.22–5.81)
RDW: 15.9 % — ABNORMAL HIGH (ref 11.5–15.5)
WBC Count: 2 10*3/uL — ABNORMAL LOW (ref 4.0–10.5)
nRBC: 0 % (ref 0.0–0.2)

## 2021-01-25 LAB — CMP (CANCER CENTER ONLY)
ALT: 19 U/L (ref 0–44)
AST: 15 U/L (ref 15–41)
Albumin: 3.4 g/dL — ABNORMAL LOW (ref 3.5–5.0)
Alkaline Phosphatase: 68 U/L (ref 38–126)
Anion gap: 6 (ref 5–15)
BUN: 10 mg/dL (ref 8–23)
CO2: 28 mmol/L (ref 22–32)
Calcium: 9.4 mg/dL (ref 8.9–10.3)
Chloride: 104 mmol/L (ref 98–111)
Creatinine: 0.95 mg/dL (ref 0.61–1.24)
GFR, Estimated: >60 mL/min (ref >60)
Glucose, Bld: 95 mg/dL (ref 70–99)
Potassium: 3.4 mmol/L — ABNORMAL LOW (ref 3.5–5.1)
Sodium: 138 mmol/L (ref 135–145)
Total Bilirubin: 0.6 mg/dL (ref 0.3–1.2)
Total Protein: 7.3 g/dL (ref 6.5–8.1)

## 2021-01-25 MED ORDER — SODIUM CHLORIDE 0.9% FLUSH
10.0000 mL | INTRAVENOUS | Status: DC | PRN
  Administered 2021-01-25: 10 mL via INTRAVENOUS

## 2021-01-25 MED ORDER — HEPARIN SOD (PORK) LOCK FLUSH 100 UNIT/ML IV SOLN
500.0000 [IU] | Freq: Once | INTRAVENOUS | Status: AC
  Administered 2021-01-25: 500 [IU] via INTRAVENOUS

## 2021-01-26 ENCOUNTER — Ambulatory Visit: Payer: 59

## 2021-01-26 ENCOUNTER — Other Ambulatory Visit: Payer: Self-pay | Admitting: Physician Assistant

## 2021-01-26 DIAGNOSIS — C3411 Malignant neoplasm of upper lobe, right bronchus or lung: Secondary | ICD-10-CM

## 2021-01-26 LAB — TSH: TSH: 0.89 u[IU]/mL (ref 0.320–4.118)

## 2021-01-27 ENCOUNTER — Encounter: Payer: Self-pay | Admitting: Urology

## 2021-01-27 NOTE — Progress Notes (Signed)
Spoke w/ patient, verified identity, and beging nursing interview. Patient states "LT hip pain 5/10 upon ambulation, otherwise doing well." No other symptoms reported at this time.  Meaningful use complete.  Patient notified of 9:30am-02/01/21 telephone appointment w/ Ashlyn Bruning PA-C. I left my extension [phone] in case patient needs to call. Patient verbalized understanding of information.  Patient preferred contact [phone]

## 2021-01-31 ENCOUNTER — Telehealth: Payer: Self-pay | Admitting: Physician Assistant

## 2021-01-31 ENCOUNTER — Encounter: Payer: Self-pay | Admitting: Urology

## 2021-01-31 NOTE — Telephone Encounter (Signed)
I am scheduled to see this patient tomorrow. I noticed his CT scan has not been scheduled. I called him and personally spoke to him. I gave him the number to radiology scheduling and asked him to call them to schedule his scan. He expressed understanding with the instructions.

## 2021-01-31 NOTE — Progress Notes (Addendum)
Nursing interview in reference to Follow-up appointment on 02/08/21.   Spoke w/ patient, verified identity, and begin nursing interview. Patient states "Doing well. No symptoms to report at this time."  Meaningful use complete.  Patient notified of 10:30am-02/08/21 telephone appointment w/ Ashlyn Bruning PA-C. I left my extension [phone] in case patient needs to call. Patient verbalized understanding of information given.  Patient contact [phone]

## 2021-01-31 NOTE — Progress Notes (Signed)
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  PRIOR THERAPY:  1)  Systemic chemotherapy according to phase 3 randomized clinical trial of chemoradiation versus chemoradiation plus atezolizumab status post 3 cycles.  This treatment was discontinued secondary to intolerance. 2) 2) palliative radiation to the left hip under the care of Dr. Tammi Klippel.  Last treatment on 01/03/2021  CURRENT THERAPY: 1) Second line systemic chemotherapy with carboplatin for AUC of 5 on day 1, etoposide 100 Mg/M2 on days 1, 2 and 3 as well as Imfinzi 1500 Mg IV on day 1 and Cosela 240 Mg/M2 before chemotherapy on days 1, 2 and 3 every 3 weeks.  First dose December 13, 2020.  Status post 2 cycles.    INTERVAL HISTORY: Adam [name] 66 y.o. male returns to the clinic today for a follow-up visit.  The patient was last seen in the clinic on 01/12/2020.  The patient is feeling fairly well today without any concerning complaints. He is status post 2 cycles of systemic chemotherapy.  He had some trouble with cycle #1 due to hypotension.  He was educated to hold his blood pressure medicine if he has hypotension which has improved his symptoms.  He tolerated cycle #2 much better.  The main concern today is related to weight loss. He lost 9 lbs since his last appointment. He states he just hasn't been cooking a lot lately. He started drinking primer protein drinks. He states he has an appetite. He notes his mouth is dry though so he has tried to increase his water intake. He has some oral thrush on exam. He denies any fever, chills, or night sweats.  He has fatigue for a day or so after infusion.  He denies any chest pain, cough, shortness of breath, or hemoptysis. He states his "breathing is great". He denies any nausea,  vomiting, or diarrhea. He has mild constipation but states that this is controlled with dulcolax. Denies any headache or visual changes.  Denies any rashes or skin changes. The patient was supposed have a restaging CT scan performed. I personally called the patient yesterday with instructions to schedule this. It has been scheduled for 02/09/21. The patient is here today for evaluation and repeat blood work before starting cycle #3.   MEDICAL HISTORY: Past Medical History:  Diagnosis Date   Hypertension    Lung cancer ([place])    small cell RUL    ALLERGIES:  has No Known Allergies.  MEDICATIONS:  Current Outpatient Medications  Medication Sig Dispense Refill   nystatin (MYCOSTATIN) 100000 UNIT/ML suspension Take 5 mLs (500,000 Units total) by mouth 4 (four) times daily. 200 mL 0   acetaminophen (TYLENOL) 325 MG tablet Take 650 mg by mouth every 6 (six) hours as needed.     amLODipine (NORVASC) 10 MG tablet Take 10 mg by mouth daily.     bisacodyl (DULCOLAX) 5 MG EC tablet Take 5 mg by mouth daily as needed for moderate constipation.     ibuprofen (ADVIL) 600 MG tablet Take 600 mg by mouth every 6 (six) hours as needed.     lidocaine-prilocaine (EMLA) cream Apply 1 application topically as needed. 30 g 2   lisinopril-hydrochlorothiazide (ZESTORETIC) 20-12.5 MG tablet Take 1 tablet by mouth daily. 90 tablet 1   No current facility-administered medications for  this visit.   Facility-Administered Medications Ordered in Other Visits  Medication Dose Route Frequency Provider Last Rate Last Admin   0.9 %  sodium chloride infusion   Intravenous Once Lashaun Poch L, PA-C 500 mL/hr at 02/01/21 1237 New Bag at 02/01/21 1237    SURGICAL HISTORY:  Past Surgical History:  Procedure Laterality Date   BRONCHIAL BRUSHINGS  02/08/2020   Procedure: BRONCHIAL BRUSHINGS;  Surgeon: Freddi Starr, MD;  Location[facility] ENDOSCOPY;  Service: Pulmonary;;   BRONCHIAL NEEDLE ASPIRATION BIOPSY   02/08/2020   Procedure: BRONCHIAL NEEDLE ASPIRATION BIOPSIES;  Surgeon: Freddi Starr, MD;  Location: [facility] ENDOSCOPY;  Service: Pulmonary;;   IR IMAGING GUIDED PORT INSERTION  12/30/2020   KNEE SURGERY Right    SHOULDER SURGERY Left    VIDEO BRONCHOSCOPY WITH ENDOBRONCHIAL ULTRASOUND N/A 02/08/2020   Procedure: VIDEO BRONCHOSCOPY WITH ENDOBRONCHIAL ULTRASOUND;  Surgeon: Freddi Starr, MD;  Location: [facility] ENDOSCOPY;  Service: Pulmonary;  Laterality: N/A;    REVIEW OF SYSTEMS:   Review of Systems  Constitutional: Positive for weight loss. Negative for  chills, fatigue, and fever.  HENT: Positive for dry mouth. Negative for mouth sores, nosebleeds, sore throat and trouble swallowing.   Eyes: Negative for eye problems and icterus.  Respiratory: Negative for cough, hemoptysis, shortness of breath and wheezing.   Cardiovascular: Negative for chest pain and leg swelling.  Gastrointestinal: Positive for mild constipation. Negative for abdominal pain,  diarrhea, nausea and vomiting.  Genitourinary: Negative for bladder incontinence, difficulty urinating, dysuria, frequency and hematuria.   Musculoskeletal: Negative for back pain, gait problem, neck pain and neck stiffness.  Skin: Negative for itching and rash.  Neurological: Negative for dizziness, extremity weakness, gait problem, headaches, light-headedness and seizures.  Hematological: Negative for adenopathy. Does not bruise/bleed easily.  Psychiatric/Behavioral: Negative for confusion, depression and sleep disturbance. The patient is not nervous/anxious.     PHYSICAL EXAMINATION:  Blood pressure (!) 137/97, pulse (!) 107, temperature (!) 96.6 F (35.9 C), temperature source Tympanic, resp. rate 19, height 5\' 9"  (1.753 m), weight 170 lb 3.2 oz (77.2 kg), SpO2 100 %.  ECOG PERFORMANCE STATUS: 1  Physical Exam  Constitutional: Oriented to person, place, and time and thin appearing male and in no distress.  HENT:  Head: Normocephalic  and atraumatic.  Mouth/Throat: Oropharynx is clear and moist. Has some oral thrush on exam.  Eyes: Conjunctivae are normal. Right eye exhibits no discharge. Left eye exhibits no discharge. No scleral icterus.  Neck: Normal range of motion. Neck supple.  Cardiovascular: Normal rate, regular rhythm, normal heart sounds and intact distal pulses.   Pulmonary/Chest: Effort normal and breath sounds normal. No respiratory distress. No wheezes. No rales.  Abdominal: Soft. Bowel sounds are normal. Exhibits no distension and no mass. There is no tenderness.  Musculoskeletal: Normal range of motion. Exhibits no edema.  Lymphadenopathy:    No cervical adenopathy.  Neurological: Alert and oriented to person, place, and time. Exhibits normal muscle tone. Gait normal. Coordination normal. Ambulates with a cane.  Skin: Skin is warm and dry. No rash noted. Not diaphoretic. No erythema. No pallor.  Psychiatric: Mood, memory and judgment normal.  Vitals reviewed.  LABORATORY DATA: Lab Results  Component Value Date   WBC 2.5 (L) 02/01/2021   HGB 11.0 (L) 02/01/2021   HCT 33.3 (L) 02/01/2021   MCV 82.0 02/01/2021   PLT 304 02/01/2021      Chemistry      Component Value Date/Time   NA 141 02/01/2021 1149  K 3.5 02/01/2021 1149   CL 106 02/01/2021 1149   CO2 28 02/01/2021 1149   BUN 11 02/01/2021 1149   CREATININE 0.92 02/01/2021 1149      Component Value Date/Time   CALCIUM 10.0 02/01/2021 1149   ALKPHOS 78 02/01/2021 1149   AST 14 (L) 02/01/2021 1149   ALT 13 02/01/2021 1149   BILITOT 0.4 02/01/2021 1149       RADIOGRAPHIC STUDIES:  No results found.   ASSESSMENT/PLAN:  This is a very pleasant 66 year old African-American male who was initially diagnosed with limited stage small cell lung cancer in February 2022.  The patient had evidence of disease recurrence and extensive stage small cell lung cancer and resumed treatment in December 2022   The patient initially underwent treatment  with NRG LU005 clinical trial where the patient would be randomized to treatment with concurrent chemoradiation with cisplatin and/or etoposide versus concurrent chemoradiation with the same regimen in addition to atezolizumab. He is status post 3 cycles of the treatment.  His treatment was discontinued secondary to intolerance and the patient had been in observation from April 2022-December 2022.  Repeat CT scan of the chest, abdomen, pelvis showed evidence of disease progression with a new mass centered in the superior aspect of the right major fissure, widespread nodularity in the adjacent portion of the upper right lung, multip new pleural-based lesions in the right hemithorax, new middle mediastinal/lymphadenopathy, new hypervascular lesion in the proximal body of the pancreas and a new lytic lesions in the parasymphyseal region of the left to pubic bone.  His insurance declined a staging PET scan.  Therefore, Dr. Julien Nordmann started the patient on palliative systemic chemotherapy with carboplatin for an AUC of 5 on day 1, etoposide 100 mg per metered squared on days 1, 2, and 3 with Cosela for myelosuppression in addition to immunotherapy with Imfinzi 1500 mg.  He status post 2 cycles.  He tolerated it fairly well except he had decreased p.o. intake and weight loss.  The patient also completed palliative radiation to the left hip under the care of Dr. Tammi Klippel.  This was completed on 01/03/2021  The patient was supposed to have a restaging CT scan performed but has not been scheduled until 02/09/21. Labs were reviewed with Dr. Julien Nordmann. His WBC is 2.5. His ANC is 1.3. The patient is on Cosela for myelo protection and we will need to monitor his labs closely every week. We will arrange GCSF injection if needed. His CMP is pending. As long as his CMP is within parameters. Recommend that he proceed with cycle #3 today scheduled.  We will see him back for follow-up visit for evaluation and repeat blood work  in 3 weeks. We will review his restaging CT scan at that time. However, if any emergent findings, then we will, of course, call the patient sooner to be seen.   Due to the patient's weight loss, we will arrange for him to receive 1/2 L- 1L of fluid today based on how time permits. I will also place a referral to the nutritionist. The patient states he has an appetite but has not been cooking for himself. I encouraged him to consider meal prepping the week/days before treatment so he has food prepared in the event he is too fatigued to cook after treatment. I also encouraged him to increase his supplemental protein drinks to 2-3x per day. Dicussed he can make his own protein drinks if he does not like what is available commercially.  He had some oral thrush on exam which is likely contributing to why his mouth feels dry in addition to poor p.o. intake. I have sent nystatin to his pharmacy.   The patient was advised to call immediately if he has any concerning symptoms in the interval. The patient voices understanding of current disease status and treatment options and is in agreement with the current care plan. All questions were answered. The patient knows to call the clinic with any problems, questions or concerns. We can certainly see the patient much sooner if necessary           Orders Placed This Encounter  Procedures   Ambulatory Referral to [facility] Nutrition    Referral Priority:   Routine    Referral Type:   Consultation    Referral Reason:   Specialty Services Required    Requested Specialty:   Oncology    Number of Visits Requested:   1    The total time spent in the appointment was 20-29 minutes.   Zian Mohamed L Chantry Headen, PA-C 02/01/21

## 2021-02-01 ENCOUNTER — Inpatient Hospital Stay: Payer: Managed Care, Other (non HMO)

## 2021-02-01 ENCOUNTER — Ambulatory Visit
Admission: RE | Admit: 2021-02-01 | Discharge: 2021-02-01 | Disposition: A | Discharge status: Home / Self Care | Payer: Managed Care, Other (non HMO) | Source: Ambulatory Visit | Attending: Urology | Admitting: Urology

## 2021-02-01 ENCOUNTER — Other Ambulatory Visit: Payer: Self-pay

## 2021-02-01 ENCOUNTER — Other Ambulatory Visit: Payer: 59

## 2021-02-01 ENCOUNTER — Inpatient Hospital Stay (HOSPITAL_BASED_OUTPATIENT_CLINIC_OR_DEPARTMENT_OTHER): Payer: Managed Care, Other (non HMO) | Admitting: Physician Assistant

## 2021-02-01 ENCOUNTER — Telehealth: Payer: Self-pay

## 2021-02-01 VITALS — BP 137/97 | HR 107 | Temp 96.6°F | Resp 19 | Ht 69.0 in | Wt 170.2 lb

## 2021-02-01 DIAGNOSIS — C7951 Secondary malignant neoplasm of bone: Secondary | ICD-10-CM

## 2021-02-01 DIAGNOSIS — C3411 Malignant neoplasm of upper lobe, right bronchus or lung: Secondary | ICD-10-CM

## 2021-02-01 DIAGNOSIS — R634 Abnormal weight loss: Secondary | ICD-10-CM | POA: Diagnosis not present

## 2021-02-01 DIAGNOSIS — R682 Dry mouth, unspecified: Secondary | ICD-10-CM

## 2021-02-01 DIAGNOSIS — Z95828 Presence of other vascular implants and grafts: Secondary | ICD-10-CM

## 2021-02-01 DIAGNOSIS — B370 Candidal stomatitis: Secondary | ICD-10-CM

## 2021-02-01 DIAGNOSIS — Z5111 Encounter for antineoplastic chemotherapy: Secondary | ICD-10-CM

## 2021-02-01 LAB — CMP (CANCER CENTER ONLY)
ALT: 13 U/L (ref 0–44)
AST: 14 U/L — ABNORMAL LOW (ref 15–41)
Albumin: 3.9 g/dL (ref 3.5–5.0)
Alkaline Phosphatase: 78 U/L (ref 38–126)
Anion gap: 7 (ref 5–15)
BUN: 11 mg/dL (ref 8–23)
CO2: 28 mmol/L (ref 22–32)
Calcium: 10 mg/dL (ref 8.9–10.3)
Chloride: 106 mmol/L (ref 98–111)
Creatinine: 0.92 mg/dL (ref 0.61–1.24)
GFR, Estimated: >60 mL/min (ref >60)
Glucose, Bld: 99 mg/dL (ref 70–99)
Potassium: 3.5 mmol/L (ref 3.5–5.1)
Sodium: 141 mmol/L (ref 135–145)
Total Bilirubin: 0.4 mg/dL (ref 0.3–1.2)
Total Protein: 7.3 g/dL (ref 6.5–8.1)

## 2021-02-01 LAB — CBC WITH DIFFERENTIAL (CANCER CENTER ONLY)
Abs Immature Granulocytes: 0.02 10*3/uL (ref 0.00–0.07)
Basophils Absolute: 0 10*3/uL (ref 0.0–0.1)
Basophils Relative: 2 %
Eosinophils Absolute: 0.1 10*3/uL (ref 0.0–0.5)
Eosinophils Relative: 5 %
HCT: 33.3 % — ABNORMAL LOW (ref 39.0–52.0)
Hemoglobin: 11 g/dL — ABNORMAL LOW (ref 13.0–17.0)
Immature Granulocytes: 1 %
Lymphocytes Relative: 23 %
Lymphs Abs: 0.6 10*3/uL — ABNORMAL LOW (ref 0.7–4.0)
MCH: 27.1 pg (ref 26.0–34.0)
MCHC: 33 g/dL (ref 30.0–36.0)
MCV: 82 fL (ref 80.0–100.0)
Monocytes Absolute: 0.5 10*3/uL (ref 0.1–1.0)
Monocytes Relative: 18 %
Neutro Abs: 1.3 10*3/uL — ABNORMAL LOW (ref 1.7–7.7)
Neutrophils Relative %: 51 %
Platelet Count: 304 10*3/uL (ref 150–400)
RBC: 4.06 MIL/uL — ABNORMAL LOW (ref 4.22–5.81)
RDW: 16.8 % — ABNORMAL HIGH (ref 11.5–15.5)
WBC Count: 2.5 10*3/uL — ABNORMAL LOW (ref 4.0–10.5)
nRBC: 0 % (ref 0.0–0.2)

## 2021-02-01 LAB — TSH: TSH: 0.579 u[IU]/mL (ref 0.320–4.118)

## 2021-02-01 MED ORDER — SODIUM CHLORIDE 0.9 % IV SOLN
603.5000 mg | Freq: Once | INTRAVENOUS | Status: AC
  Administered 2021-02-01: 15:00:00 600 mg via INTRAVENOUS
  Filled 2021-02-01: qty 60

## 2021-02-01 MED ORDER — SODIUM CHLORIDE 0.9% FLUSH
10.0000 mL | INTRAVENOUS | Status: DC | PRN
  Administered 2021-02-01: 17:00:00 10 mL

## 2021-02-01 MED ORDER — SODIUM CHLORIDE 0.9 % IV SOLN: Freq: Once | INTRAVENOUS | Status: AC

## 2021-02-01 MED ORDER — TRILACICLIB DIHYDROCHLORIDE INJECTION 300 MG
240.0000 mg/m2 | Freq: Once | INTRAVENOUS | Status: AC
  Administered 2021-02-01: 480 mg via INTRAVENOUS
  Filled 2021-02-01: qty 32

## 2021-02-01 MED ORDER — SODIUM CHLORIDE 0.9 % IV SOLN
1500.0000 mg | Freq: Once | INTRAVENOUS | Status: AC
  Administered 2021-02-01: 14:00:00 1500 mg via INTRAVENOUS
  Filled 2021-02-01: qty 30

## 2021-02-01 MED ORDER — SODIUM CHLORIDE 0.9 % IV SOLN
100.0000 mg/m2 | Freq: Once | INTRAVENOUS | Status: AC
  Administered 2021-02-01: 16:00:00 200 mg via INTRAVENOUS
  Filled 2021-02-01: qty 10

## 2021-02-01 MED ORDER — SODIUM CHLORIDE 0.9% FLUSH
10.0000 mL | INTRAVENOUS | Status: AC | PRN
  Administered 2021-02-01: 12:00:00 10 mL

## 2021-02-01 MED ORDER — SODIUM CHLORIDE 0.9 % IV SOLN
150.0000 mg | Freq: Once | INTRAVENOUS | Status: AC
  Administered 2021-02-01: 13:00:00 150 mg via INTRAVENOUS
  Filled 2021-02-01: qty 150

## 2021-02-01 MED ORDER — SODIUM CHLORIDE 0.9 % IV SOLN
10.0000 mg | Freq: Once | INTRAVENOUS | Status: AC
  Administered 2021-02-01: 13:00:00 10 mg via INTRAVENOUS
  Filled 2021-02-01: qty 10

## 2021-02-01 MED ORDER — NYSTATIN 100000 UNIT/ML MT SUSP: 5.0000 mL | Freq: Four times a day (QID) | OROMUCOSAL | 0 refills | Status: DC

## 2021-02-01 MED ORDER — HEPARIN SOD (PORK) LOCK FLUSH 100 UNIT/ML IV SOLN
500.0000 [IU] | Freq: Once | INTRAVENOUS | Status: AC | PRN
  Administered 2021-02-01: 17:00:00 500 [IU]

## 2021-02-01 MED ORDER — PALONOSETRON HCL INJECTION 0.25 MG/5ML
0.2500 mg | Freq: Once | INTRAVENOUS | Status: AC
  Administered 2021-02-01: 13:00:00 0.25 mg via INTRAVENOUS
  Filled 2021-02-01: qty 5

## 2021-02-01 MED ORDER — SODIUM CHLORIDE 0.9 % IV SOLN: Freq: Once | INTRAVENOUS | Status: DC

## 2021-02-01 NOTE — Progress Notes (Signed)
°  Radiation Oncology         [phone] ________________________________  Name: Adam [name] MRN: [id number]  Date: 01/03/2021  DOB: September 12, 1955  End of Treatment Note  Diagnosis:   67 yo man with a painful bony lesion in the left pubic bone secondary to metastatic small cell lung cancer of the right upper lung,     Indication for treatment:  Palliation       Radiation treatment dates:   12/12/20 - 01/03/21  Site/dose:   The painful metastatic lesion in the left pubic bone was treated to 35 Gy in 14 fxs of 2.5 Gy each.  Beams/energy:   A 3D field set-up was employed with 6 MV X-rays  Narrative: The patient tolerated radiation treatment relatively well and did appreciate improvement in the left hip pain near the end of treatment. He did not report any ill side effects.  Plan: The patient has completed radiation treatment. The patient will return to radiation oncology clinic for routine followup in one month. I advised him to call or return sooner if he has any questions or concerns related to his recovery or treatment. ________________________________  Sheral Apley. Tammi Klippel, M.D.

## 2021-02-01 NOTE — Progress Notes (Signed)
Okay to treat with ANC and elevated pulse per Cassie PA

## 2021-02-01 NOTE — Progress Notes (Signed)
Radiation Oncology         [phone] ________________________________  Name: Adam [name] MRN: [id number]  Date: 02/01/2021  DOB: 09/25/1955  Post Treatment Note  CC: de [place], [name], MD  Curt Bears, MD  Diagnosis:   66 yo man with a painful bony lesion in the left pubic bone secondary to metastatic small cell lung cancer of the right upper lung,    Interval Since Last Radiation:  4 weeks  12/12/20 - 01/03/21:  The painful metastatic lesion in the left pubic bone was treated to 35 Gy in 14 fxs of 2.5 Gy each.  06/27/20 - 07/08/20//PCI: The whole brain was treated to 25 Gy in 10 fractions of 2.5 Gy   03/15/20 - 04/28/20:  The primary tumor and involved mediastinal adenopathy were treated to 66 Gy in 33 fractions of 2 Gy; concurrent with chemotherapy.  Narrative:  I spoke with the patient to conduct his routine scheduled 1 month follow up visit via telephone to spare the patient unnecessary potential exposure in the healthcare setting during the current COVID-19 pandemic.  The patient was notified in advance and gave permission to proceed with this visit format.  He tolerated radiation treatment relatively well and did appreciate improvement in the left hip pain near the end of treatment. He did not report any ill side effects.                              On review of systems, the patient states that he is doing very well in general.  He has continued to notice improvement in the left hip pain which he is quite pleased with.  Over the past 3 days, he has been able to ambulate without the assistance of a cane and is now able to be more active, pain-free.  He specifically denies any focal bony pains, paresthesias or weakness in the upper or lower extremities.  He reports a healthy appetite and is maintaining his weight.  He feels like he is tolerating his current systemic therapy very well and overall, is quite pleased with his progress to date.  He is scheduled for repeat systemic imaging  on 02/09/2021 prior to his third cycle of the second line chemotherapy.  ALLERGIES:  has No Known Allergies.  Meds: Current Outpatient Medications  Medication Sig Dispense Refill   acetaminophen (TYLENOL) 325 MG tablet Take 650 mg by mouth every 6 (six) hours as needed.     amLODipine (NORVASC) 10 MG tablet Take 10 mg by mouth daily.     bisacodyl (DULCOLAX) 5 MG EC tablet Take 5 mg by mouth daily as needed for moderate constipation.     ibuprofen (ADVIL) 600 MG tablet Take 600 mg by mouth every 6 (six) hours as needed.     lidocaine-prilocaine (EMLA) cream Apply 1 application topically as needed. 30 g 2   lisinopril-hydrochlorothiazide (ZESTORETIC) 20-12.5 MG tablet Take 1 tablet by mouth daily. 90 tablet 1   nystatin (MYCOSTATIN) 100000 UNIT/ML suspension Take 5 mLs (500,000 Units total) by mouth 4 (four) times daily. 200 mL 0   No current facility-administered medications for this encounter.   Facility-Administered Medications Ordered in Other Encounters  Medication Dose Route Frequency Provider Last Rate Last Admin   0.9 %  sodium chloride infusion   Intravenous Once Heilingoetter, Cassandra L, PA-C 500 mL/hr at 02/01/21 1237 New Bag at 02/01/21 1237    Physical Findings:  vitals were not  taken for this visit.  Pain Assessment Pain Score: 5  (LT hip)/10 Unable to assess due to telephone follow-up visit format.  Lab Findings: Lab Results  Component Value Date   WBC 2.5 (L) 02/01/2021   HGB 11.0 (L) 02/01/2021   HCT 33.3 (L) 02/01/2021   MCV 82.0 02/01/2021   PLT 304 02/01/2021     Radiographic Findings: No results found.  Impression/Plan: 38. 66 yo man with a painful bony lesion in the left pubic bone secondary to metastatic small cell lung cancer of the right upper lung. He appears to have recovered well from the effects of his recent palliative radiotherapy to the left pubic lesion and is currently without complaints.  He reports significant improvement in his pain and  is now able to be more active, pain-free.  We discussed that while we are happy to continue to participate in his care if clinically indicated, at this point, we will plan to see him back on an as-needed basis.  He will continue in routine follow-up under the care and direction of Dr. Marlou Porch for continued management of his systemic disease.  We enjoyed taking care of him and look forward to continuing to follow his progress via correspondence.  He knows that he is welcome to call at anytime with any questions or concerns related to his previous radiation.    Nicholos Johns, PA-C

## 2021-02-01 NOTE — Patient Instructions (Signed)
[facility]  Discharge Instructions: Thank you for choosing [facility] to provide your oncology and hematology care.   If you have a lab appointment with the [facility], please go directly to the [facility] and check in at the registration area.   Wear comfortable clothing and clothing appropriate for easy access to any Portacath or PICC line.   We strive to give you quality time with your provider. You may need to reschedule your appointment if you arrive late (15 or more minutes).  Arriving late affects you and other patients whose appointments are after yours.  Also, if you miss three or more appointments without notifying the office, you may be dismissed from the clinic at the providers discretion.      For prescription refill requests, have your pharmacy contact our office and allow 72 hours for refills to be completed.    Today you received the following chemotherapy and/or immunotherapy agents: Cosela, Imfinzi, Carboplatin, and Etoposide      To help prevent nausea and vomiting after your treatment, we encourage you to take your nausea medication as directed.  BELOW ARE SYMPTOMS THAT SHOULD BE REPORTED IMMEDIATELY: *FEVER GREATER THAN 100.4 F (38 C) OR HIGHER *CHILLS OR SWEATING *NAUSEA AND VOMITING THAT IS NOT CONTROLLED WITH YOUR NAUSEA MEDICATION *UNUSUAL SHORTNESS OF BREATH *UNUSUAL BRUISING OR BLEEDING *URINARY PROBLEMS (pain or burning when urinating, or frequent urination) *BOWEL PROBLEMS (unusual diarrhea, constipation, pain near the anus) TENDERNESS IN MOUTH AND THROAT WITH OR WITHOUT PRESENCE OF ULCERS (sore throat, sores in mouth, or a toothache) UNUSUAL RASH, SWELLING OR PAIN  UNUSUAL VAGINAL DISCHARGE OR ITCHING   Items with * indicate a potential emergency and should be followed up as soon as possible or go to the Emergency Department if any problems should occur.  Please show the CHEMOTHERAPY ALERT CARD or  IMMUNOTHERAPY ALERT CARD at check-in to the Emergency Department and triage nurse.  Should you have questions after your visit or need to cancel or reschedule your appointment, please contact [facility]  Dept: [phone]  and follow the prompts.  Office hours are 8:00 a.m. to 4:30 p.m. Monday - Friday. Please note that voicemails left after 4:00 p.m. may not be returned until the following business day.  We are closed weekends and major holidays. You have access to a nurse at all times for urgent questions. Please call the main number to the clinic Dept: [phone] and follow the prompts.   For any non-urgent questions, you may also contact your provider using MyChart. We now offer e-Visits for anyone 79 and older to request care online for non-urgent symptoms. For details visit [email or web address].   Also download the MyChart app! Go to the app store, search "MyChart", open the app, select [facility], and log in with your MyChart username and password.  Due to Covid, a mask is required upon entering the hospital/clinic. If you do not have a mask, one will be given to you upon arrival. For doctor visits, patients may have 1 support person aged 63 or older with them. For treatment visits, patients cannot have anyone with them due to current Covid guidelines and our immunocompromised population.

## 2021-02-01 NOTE — Telephone Encounter (Signed)
Called patient regarding missed appointments this morning 02/01/21. Patient informed me that he was told by another nurse that he did not need to be here in person for his appointments. I let the patient know that was not the case, and that I will talk to [facility] regarding this matter and give him a call back on how to proceed. Patient verbalized understanding and thanks.

## 2021-02-02 ENCOUNTER — Inpatient Hospital Stay: Payer: Managed Care, Other (non HMO)

## 2021-02-02 ENCOUNTER — Inpatient Hospital Stay: Payer: Managed Care, Other (non HMO) | Admitting: Dietician

## 2021-02-02 VITALS — BP 117/73 | HR 100 | Temp 97.9°F | Resp 18

## 2021-02-02 DIAGNOSIS — C3411 Malignant neoplasm of upper lobe, right bronchus or lung: Secondary | ICD-10-CM

## 2021-02-02 MED ORDER — SODIUM CHLORIDE 0.9 % IV SOLN
10.0000 mg | Freq: Once | INTRAVENOUS | Status: AC
  Administered 2021-02-02: 10 mg via INTRAVENOUS
  Filled 2021-02-02: qty 10

## 2021-02-02 MED ORDER — SODIUM CHLORIDE 0.9 % IV SOLN: Freq: Once | INTRAVENOUS | Status: AC

## 2021-02-02 MED ORDER — SODIUM CHLORIDE 0.9 % IV SOLN
100.0000 mg/m2 | Freq: Once | INTRAVENOUS | Status: AC
  Administered 2021-02-02: 200 mg via INTRAVENOUS
  Filled 2021-02-02: qty 10

## 2021-02-02 MED ORDER — TRILACICLIB DIHYDROCHLORIDE INJECTION 300 MG
240.0000 mg/m2 | Freq: Once | INTRAVENOUS | Status: AC
  Administered 2021-02-02: 480 mg via INTRAVENOUS
  Filled 2021-02-02: qty 32

## 2021-02-02 MED ORDER — SODIUM CHLORIDE 0.9% FLUSH
10.0000 mL | INTRAVENOUS | Status: DC | PRN
  Administered 2021-02-02: 10 mL

## 2021-02-02 MED ORDER — HEPARIN SOD (PORK) LOCK FLUSH 100 UNIT/ML IV SOLN
500.0000 [IU] | Freq: Once | INTRAVENOUS | Status: AC | PRN
  Administered 2021-02-02: 500 [IU]

## 2021-02-02 MED FILL — Dexamethasone Sodium Phosphate Inj 100 MG/10ML: INTRAMUSCULAR | Qty: 1 | Status: AC

## 2021-02-02 NOTE — Patient Instructions (Signed)
[facility]  Discharge Instructions: Thank you for choosing [facility] to provide your oncology and hematology care.   If you have a lab appointment with the [facility], please go directly to the [facility] and check in at the registration area.   Wear comfortable clothing and clothing appropriate for easy access to any Portacath or PICC line.   We strive to give you quality time with your provider. You may need to reschedule your appointment if you arrive late (15 or more minutes).  Arriving late affects you and other patients whose appointments are after yours.  Also, if you miss three or more appointments without notifying the office, you may be dismissed from the clinic at the providers discretion.      For prescription refill requests, have your pharmacy contact our office and allow 72 hours for refills to be completed.    Today you received the following chemotherapy and/or immunotherapy agents: Cosela and Etoposide.      To help prevent nausea and vomiting after your treatment, we encourage you to take your nausea medication as directed.  BELOW ARE SYMPTOMS THAT SHOULD BE REPORTED IMMEDIATELY: *FEVER GREATER THAN 100.4 F (38 C) OR HIGHER *CHILLS OR SWEATING *NAUSEA AND VOMITING THAT IS NOT CONTROLLED WITH YOUR NAUSEA MEDICATION *UNUSUAL SHORTNESS OF BREATH *UNUSUAL BRUISING OR BLEEDING *URINARY PROBLEMS (pain or burning when urinating, or frequent urination) *BOWEL PROBLEMS (unusual diarrhea, constipation, pain near the anus) TENDERNESS IN MOUTH AND THROAT WITH OR WITHOUT PRESENCE OF ULCERS (sore throat, sores in mouth, or a toothache) UNUSUAL RASH, SWELLING OR PAIN  UNUSUAL VAGINAL DISCHARGE OR ITCHING   Items with * indicate a potential emergency and should be followed up as soon as possible or go to the Emergency Department if any problems should occur.  Please show the CHEMOTHERAPY ALERT CARD or IMMUNOTHERAPY ALERT CARD at  check-in to the Emergency Department and triage nurse.  Should you have questions after your visit or need to cancel or reschedule your appointment, please contact [facility]  Dept: [phone]  and follow the prompts.  Office hours are 8:00 a.m. to 4:30 p.m. Monday - Friday. Please note that voicemails left after 4:00 p.m. may not be returned until the following business day.  We are closed weekends and major holidays. You have access to a nurse at all times for urgent questions. Please call the main number to the clinic Dept: [phone] and follow the prompts.   For any non-urgent questions, you may also contact your provider using MyChart. We now offer e-Visits for anyone 49 and older to request care online for non-urgent symptoms. For details visit [email or web address].   Also download the MyChart app! Go to the app store, search "MyChart", open the app, select [facility], and log in with your MyChart username and password.  Due to Covid, a mask is required upon entering the hospital/clinic. If you do not have a mask, one will be given to you upon arrival. For doctor visits, patients may have 1 support person aged 64 or older with them. For treatment visits, patients cannot have anyone with them due to current Covid guidelines and our immunocompromised population.

## 2021-02-03 ENCOUNTER — Inpatient Hospital Stay: Payer: Managed Care, Other (non HMO)

## 2021-02-03 ENCOUNTER — Other Ambulatory Visit: Payer: Self-pay

## 2021-02-03 VITALS — BP 110/70 | HR 102 | Temp 98.1°F | Resp 16

## 2021-02-03 DIAGNOSIS — C3411 Malignant neoplasm of upper lobe, right bronchus or lung: Secondary | ICD-10-CM

## 2021-02-03 MED ORDER — SODIUM CHLORIDE 0.9 % IV SOLN: Freq: Once | INTRAVENOUS | Status: AC

## 2021-02-03 MED ORDER — SODIUM CHLORIDE 0.9 % IV SOLN
100.0000 mg/m2 | Freq: Once | INTRAVENOUS | Status: AC
  Administered 2021-02-03: 200 mg via INTRAVENOUS
  Filled 2021-02-03: qty 10

## 2021-02-03 MED ORDER — SODIUM CHLORIDE 0.9% FLUSH
10.0000 mL | INTRAVENOUS | Status: DC | PRN
  Administered 2021-02-03: 10 mL

## 2021-02-03 MED ORDER — HEPARIN SOD (PORK) LOCK FLUSH 100 UNIT/ML IV SOLN
500.0000 [IU] | Freq: Once | INTRAVENOUS | Status: AC | PRN
  Administered 2021-02-03: 500 [IU]

## 2021-02-03 MED ORDER — TRILACICLIB DIHYDROCHLORIDE INJECTION 300 MG
240.0000 mg/m2 | Freq: Once | INTRAVENOUS | Status: AC
  Administered 2021-02-03: 480 mg via INTRAVENOUS
  Filled 2021-02-03: qty 32

## 2021-02-03 MED ORDER — SODIUM CHLORIDE 0.9 % IV SOLN
10.0000 mg | Freq: Once | INTRAVENOUS | Status: AC
  Administered 2021-02-03: 10 mg via INTRAVENOUS
  Filled 2021-02-03: qty 10

## 2021-02-03 NOTE — Patient Instructions (Signed)
[facility]  Discharge Instructions: Thank you for choosing [facility] to provide your oncology and hematology care.   If you have a lab appointment with the Cancer Center, please go directly to the Cancer Center and check in at the registration area.   Wear comfortable clothing and clothing appropriate for easy access to any Portacath or PICC line.   We strive to give you quality time with your provider. You may need to reschedule your appointment if you arrive late (15 or more minutes).  Arriving late affects you and other patients whose appointments are after yours.  Also, if you miss three or more appointments without notifying the office, you may be dismissed from the clinic at the provider's discretion.      For prescription refill requests, have your pharmacy contact our office and allow 72 hours for refills to be completed.    Today you received the following chemotherapy and/or immunotherapy agents: etoposide.     To help prevent nausea and vomiting after your treatment, we encourage you to take your nausea medication as directed.  BELOW ARE SYMPTOMS THAT SHOULD BE REPORTED IMMEDIATELY: . *FEVER GREATER THAN 100.4 F (38 C) OR HIGHER . *CHILLS OR SWEATING . *NAUSEA AND VOMITING THAT IS NOT CONTROLLED WITH YOUR NAUSEA MEDICATION . *UNUSUAL SHORTNESS OF BREATH . *UNUSUAL BRUISING OR BLEEDING . *URINARY PROBLEMS (pain or burning when urinating, or frequent urination) . *BOWEL PROBLEMS (unusual diarrhea, constipation, pain near the anus) . TENDERNESS IN MOUTH AND THROAT WITH OR WITHOUT PRESENCE OF ULCERS (sore throat, sores in mouth, or a toothache) . UNUSUAL RASH, SWELLING OR PAIN  . UNUSUAL VAGINAL DISCHARGE OR ITCHING   Items with * indicate a potential emergency and should be followed up as soon as possible or go to the Emergency Department if any problems should occur.  Please show the CHEMOTHERAPY ALERT CARD or IMMUNOTHERAPY ALERT  CARD at check-in to the Emergency Department and triage nurse.  Should you have questions after your visit or need to cancel or reschedule your appointment, please contact [facility]  Dept: [phone]  and follow the prompts.  Office hours are 8:00 a.m. to 4:30 p.m. Monday - Friday. Please note that voicemails left after 4:00 p.m. may not be returned until the following business day.  We are closed weekends and major holidays. You have access to a nurse at all times for urgent questions. Please call the main number to the clinic Dept: [phone] and follow the prompts.   For any non-urgent questions, you may also contact your provider using MyChart. We now offer e-Visits for anyone 18 and older to request care online for non-urgent symptoms. For details visit [email or web address].   Also download the MyChart app! Go to the app store, search "MyChart", open the app, select [facility], and log in with your MyChart username and password.  Due to Covid, a mask is required upon entering the hospital/clinic. If you do not have a mask, one will be given to you upon arrival. For doctor visits, patients may have 1 support person aged 18 or older with them. For treatment visits, patients cannot have anyone with them due to current Covid guidelines and our immunocompromised population.   

## 2021-02-05 ENCOUNTER — Encounter: Payer: Self-pay | Admitting: Internal Medicine

## 2021-02-05 NOTE — Progress Notes (Signed)
Nutrition Assessment   Reason for Assessment: Provider request   ASSESSMENT: 66 year old male with metastatic small cell lung cancer, right upper lobe. He is currently receiving carboplatin. Patient is followed by Dr. Julien Nordmann.   Met with patient during infusion. He reports tolerating treatment well. Patient denies nausea, vomiting, diarrhea. He has mild constipation. Patient is taking dulcolax which works well for him. Patient reports good appetite, however his truck recently broke down and is without transportation. Patient is reliant on others to take him to the store. Patient likes ice cream, has not had any in a while. He eats soft easy to chew foods secondary to multiple missing teeth. Patient is drinking [employer], [employer] (consulting) daily. He has increased water intake to aid with dry mouth. Oral thrush noted per 1/25 PA-C exam.  Nutrition Focused Physical Exam: deferred   Medications: Nystatin, Dulcolax   Labs: reviewed   Anthropometrics: Weight have decreased (5%) from 179 lb 1.6 oz on 1/09 and ~12% decrease from usual weight in 5 months; significant  Height: 5'9" Weight: 170 lb 3.2 oz (02/01/21) UBW: 193-194 lb (May-August 2022) BMI: 25.13   NUTRITION DIAGNOSIS: Inadequate oral intake related to social/environmental circumstances as evidenced by reported food insecurity associated with limited transportation and significant 5% weight loss in 2 weeks   INTERVENTION:  Encouraged high calorie high protein foods to promote weight maintenance/gain Discussed soft moist high protein foods - handout with ideas provided Continue drinking oral nutrition supplement, suggested switching to Ensure Plus/equivalent for added calories and consuming 2-3/day - coupons provided Patient agreeable to chocolate Ensure Complete to try and appreciative of the cup of ice cream to eat during infusion Patient provided one food bag from [employer] information provided  MONITORING, EVALUATION, GOAL:  Patient will tolerate increased calories and protein to minimize weight loss   Next Visit: Wednesday February 15 during infusion

## 2021-02-08 ENCOUNTER — Other Ambulatory Visit: Payer: 59

## 2021-02-08 ENCOUNTER — Inpatient Hospital Stay: Payer: Managed Care, Other (non HMO) | Attending: Hematology and Oncology

## 2021-02-08 ENCOUNTER — Other Ambulatory Visit: Payer: Self-pay

## 2021-02-08 ENCOUNTER — Ambulatory Visit
Admission: RE | Admit: 2021-02-08 | Discharge: 2021-02-08 | Disposition: A | Discharge status: Home / Self Care | Payer: Managed Care, Other (non HMO) | Source: Ambulatory Visit | Attending: Urology | Admitting: Urology

## 2021-02-08 DIAGNOSIS — Z006 Encounter for examination for normal comparison and control in clinical research program: Secondary | ICD-10-CM | POA: Insufficient documentation

## 2021-02-08 DIAGNOSIS — Z95828 Presence of other vascular implants and grafts: Secondary | ICD-10-CM

## 2021-02-08 DIAGNOSIS — Z79899 Other long term (current) drug therapy: Secondary | ICD-10-CM | POA: Insufficient documentation

## 2021-02-08 DIAGNOSIS — C3411 Malignant neoplasm of upper lobe, right bronchus or lung: Secondary | ICD-10-CM | POA: Diagnosis not present

## 2021-02-08 DIAGNOSIS — Z5111 Encounter for antineoplastic chemotherapy: Secondary | ICD-10-CM | POA: Diagnosis present

## 2021-02-08 DIAGNOSIS — I10 Essential (primary) hypertension: Secondary | ICD-10-CM | POA: Insufficient documentation

## 2021-02-08 LAB — CBC WITH DIFFERENTIAL (CANCER CENTER ONLY)
Abs Immature Granulocytes: 0.03 10*3/uL (ref 0.00–0.07)
Basophils Absolute: 0 10*3/uL (ref 0.0–0.1)
Basophils Relative: 1 %
Eosinophils Absolute: 0.1 10*3/uL (ref 0.0–0.5)
Eosinophils Relative: 3 %
HCT: 31 % — ABNORMAL LOW (ref 39.0–52.0)
Hemoglobin: 10.2 g/dL — ABNORMAL LOW (ref 13.0–17.0)
Immature Granulocytes: 1 %
Lymphocytes Relative: 19 %
Lymphs Abs: 0.4 10*3/uL — ABNORMAL LOW (ref 0.7–4.0)
MCH: 26.8 pg (ref 26.0–34.0)
MCHC: 32.9 g/dL (ref 30.0–36.0)
MCV: 81.6 fL (ref 80.0–100.0)
Monocytes Absolute: 0.1 10*3/uL (ref 0.1–1.0)
Monocytes Relative: 5 %
Neutro Abs: 1.6 10*3/uL — ABNORMAL LOW (ref 1.7–7.7)
Neutrophils Relative %: 71 %
Platelet Count: 225 10*3/uL (ref 150–400)
RBC: 3.8 MIL/uL — ABNORMAL LOW (ref 4.22–5.81)
RDW: 17 % — ABNORMAL HIGH (ref 11.5–15.5)
WBC Count: 2.2 10*3/uL — ABNORMAL LOW (ref 4.0–10.5)
nRBC: 0 % (ref 0.0–0.2)

## 2021-02-08 LAB — CMP (CANCER CENTER ONLY)
ALT: 16 U/L (ref 0–44)
AST: 14 U/L — ABNORMAL LOW (ref 15–41)
Albumin: 3.7 g/dL (ref 3.5–5.0)
Alkaline Phosphatase: 66 U/L (ref 38–126)
Anion gap: 6 (ref 5–15)
BUN: 13 mg/dL (ref 8–23)
CO2: 28 mmol/L (ref 22–32)
Calcium: 9.7 mg/dL (ref 8.9–10.3)
Chloride: 106 mmol/L (ref 98–111)
Creatinine: 0.77 mg/dL (ref 0.61–1.24)
GFR, Estimated: >60 mL/min (ref >60)
Glucose, Bld: 97 mg/dL (ref 70–99)
Potassium: 3.9 mmol/L (ref 3.5–5.1)
Sodium: 140 mmol/L (ref 135–145)
Total Bilirubin: 0.5 mg/dL (ref 0.3–1.2)
Total Protein: 7 g/dL (ref 6.5–8.1)

## 2021-02-08 MED ORDER — HEPARIN SOD (PORK) LOCK FLUSH 100 UNIT/ML IV SOLN
500.0000 [IU] | INTRAVENOUS | Status: AC | PRN
  Administered 2021-02-08: 500 [IU]

## 2021-02-08 MED ORDER — SODIUM CHLORIDE 0.9% FLUSH
10.0000 mL | INTRAVENOUS | Status: AC | PRN
  Administered 2021-02-08: 10 mL

## 2021-02-09 ENCOUNTER — Ambulatory Visit (HOSPITAL_COMMUNITY)
Admission: RE | Admit: 2021-02-09 | Discharge: 2021-02-09 | Disposition: A | Discharge status: Home / Self Care | Payer: Managed Care, Other (non HMO) | Source: Ambulatory Visit | Attending: Physician Assistant | Admitting: Physician Assistant

## 2021-02-09 ENCOUNTER — Encounter (HOSPITAL_COMMUNITY): Payer: Self-pay

## 2021-02-09 DIAGNOSIS — C3491 Malignant neoplasm of unspecified part of right bronchus or lung: Secondary | ICD-10-CM | POA: Diagnosis present

## 2021-02-09 MED ORDER — IOHEXOL 300 MG/ML  SOLN
100.0000 mL | Freq: Once | INTRAMUSCULAR | Status: AC | PRN
  Administered 2021-02-09: 100 mL via INTRAVENOUS

## 2021-02-09 MED ORDER — SODIUM CHLORIDE (PF) 0.9 % IJ SOLN
INTRAMUSCULAR | Status: AC
  Filled 2021-02-09: qty 50

## 2021-02-14 ENCOUNTER — Ambulatory Visit: Payer: 59 | Admitting: Internal Medicine

## 2021-02-14 ENCOUNTER — Other Ambulatory Visit: Payer: Managed Care, Other (non HMO)

## 2021-02-14 ENCOUNTER — Ambulatory Visit: Payer: 59

## 2021-02-14 ENCOUNTER — Other Ambulatory Visit: Payer: 59

## 2021-02-15 ENCOUNTER — Ambulatory Visit: Payer: 59

## 2021-02-15 ENCOUNTER — Inpatient Hospital Stay: Payer: Managed Care, Other (non HMO)

## 2021-02-16 ENCOUNTER — Ambulatory Visit: Payer: 59

## 2021-02-22 ENCOUNTER — Inpatient Hospital Stay: Payer: Managed Care, Other (non HMO) | Admitting: Dietician

## 2021-02-22 ENCOUNTER — Inpatient Hospital Stay: Payer: Managed Care, Other (non HMO)

## 2021-02-22 ENCOUNTER — Other Ambulatory Visit: Payer: Self-pay

## 2021-02-22 ENCOUNTER — Inpatient Hospital Stay: Payer: Managed Care, Other (non HMO) | Admitting: Internal Medicine

## 2021-02-22 ENCOUNTER — Inpatient Hospital Stay: Payer: Managed Care, Other (non HMO) | Admitting: Emergency Medicine

## 2021-02-22 VITALS — HR 90

## 2021-02-22 VITALS — BP 151/97 | HR 107 | Temp 96.3°F | Resp 18 | Ht 69.0 in | Wt 173.6 lb

## 2021-02-22 DIAGNOSIS — C3411 Malignant neoplasm of upper lobe, right bronchus or lung: Secondary | ICD-10-CM

## 2021-02-22 DIAGNOSIS — Z006 Encounter for examination for normal comparison and control in clinical research program: Secondary | ICD-10-CM | POA: Diagnosis not present

## 2021-02-22 DIAGNOSIS — Z5112 Encounter for antineoplastic immunotherapy: Secondary | ICD-10-CM

## 2021-02-22 DIAGNOSIS — Z7189 Other specified counseling: Secondary | ICD-10-CM | POA: Insufficient documentation

## 2021-02-22 DIAGNOSIS — Z5111 Encounter for antineoplastic chemotherapy: Secondary | ICD-10-CM

## 2021-02-22 DIAGNOSIS — Z95828 Presence of other vascular implants and grafts: Secondary | ICD-10-CM

## 2021-02-22 LAB — CMP (CANCER CENTER ONLY)
ALT: 7 U/L (ref 0–44)
AST: 12 U/L — ABNORMAL LOW (ref 15–41)
Albumin: 3.8 g/dL (ref 3.5–5.0)
Alkaline Phosphatase: 70 U/L (ref 38–126)
Anion gap: 6 (ref 5–15)
BUN: 7 mg/dL — ABNORMAL LOW (ref 8–23)
CO2: 29 mmol/L (ref 22–32)
Calcium: 9.7 mg/dL (ref 8.9–10.3)
Chloride: 104 mmol/L (ref 98–111)
Creatinine: 0.9 mg/dL (ref 0.61–1.24)
GFR, Estimated: >60 mL/min (ref >60)
Glucose, Bld: 107 mg/dL — ABNORMAL HIGH (ref 70–99)
Potassium: 3.5 mmol/L (ref 3.5–5.1)
Sodium: 139 mmol/L (ref 135–145)
Total Bilirubin: 0.3 mg/dL (ref 0.3–1.2)
Total Protein: 7 g/dL (ref 6.5–8.1)

## 2021-02-22 LAB — CBC WITH DIFFERENTIAL (CANCER CENTER ONLY)
Abs Immature Granulocytes: 0.05 10*3/uL (ref 0.00–0.07)
Basophils Absolute: 0 10*3/uL (ref 0.0–0.1)
Basophils Relative: 1 %
Eosinophils Absolute: 0.2 10*3/uL (ref 0.0–0.5)
Eosinophils Relative: 7 %
HCT: 31.2 % — ABNORMAL LOW (ref 39.0–52.0)
Hemoglobin: 10.3 g/dL — ABNORMAL LOW (ref 13.0–17.0)
Immature Granulocytes: 2 %
Lymphocytes Relative: 18 %
Lymphs Abs: 0.5 10*3/uL — ABNORMAL LOW (ref 0.7–4.0)
MCH: 27.2 pg (ref 26.0–34.0)
MCHC: 33 g/dL (ref 30.0–36.0)
MCV: 82.3 fL (ref 80.0–100.0)
Monocytes Absolute: 0.7 10*3/uL (ref 0.1–1.0)
Monocytes Relative: 25 %
Neutro Abs: 1.3 10*3/uL — ABNORMAL LOW (ref 1.7–7.7)
Neutrophils Relative %: 47 %
Platelet Count: 254 10*3/uL (ref 150–400)
RBC: 3.79 MIL/uL — ABNORMAL LOW (ref 4.22–5.81)
RDW: 18.7 % — ABNORMAL HIGH (ref 11.5–15.5)
WBC Count: 2.8 10*3/uL — ABNORMAL LOW (ref 4.0–10.5)
nRBC: 0 % (ref 0.0–0.2)

## 2021-02-22 LAB — TSH: TSH: 0.845 u[IU]/mL (ref 0.320–4.118)

## 2021-02-22 MED ORDER — SODIUM CHLORIDE 0.9 % IV SOLN
10.0000 mg | Freq: Once | INTRAVENOUS | Status: AC
  Administered 2021-02-22: 10 mg via INTRAVENOUS
  Filled 2021-02-22: qty 10

## 2021-02-22 MED ORDER — SODIUM CHLORIDE 0.9 % IV SOLN: Freq: Once | INTRAVENOUS | Status: AC

## 2021-02-22 MED ORDER — SODIUM CHLORIDE 0.9 % IV SOLN
623.5000 mg | Freq: Once | INTRAVENOUS | Status: AC
  Administered 2021-02-22: 620 mg via INTRAVENOUS
  Filled 2021-02-22: qty 62

## 2021-02-22 MED ORDER — SODIUM CHLORIDE 0.9% FLUSH
10.0000 mL | INTRAVENOUS | Status: AC | PRN
  Administered 2021-02-22: 10 mL

## 2021-02-22 MED ORDER — SODIUM CHLORIDE 0.9% FLUSH
10.0000 mL | INTRAVENOUS | Status: DC | PRN
  Administered 2021-02-22: 10 mL

## 2021-02-22 MED ORDER — PALONOSETRON HCL INJECTION 0.25 MG/5ML
0.2500 mg | Freq: Once | INTRAVENOUS | Status: AC
  Administered 2021-02-22: 0.25 mg via INTRAVENOUS
  Filled 2021-02-22: qty 5

## 2021-02-22 MED ORDER — SODIUM CHLORIDE 0.9 % IV SOLN
1500.0000 mg | Freq: Once | INTRAVENOUS | Status: AC
  Administered 2021-02-22: 1500 mg via INTRAVENOUS
  Filled 2021-02-22: qty 30

## 2021-02-22 MED ORDER — SODIUM CHLORIDE 0.9 % IV SOLN
100.0000 mg/m2 | Freq: Once | INTRAVENOUS | Status: AC
  Administered 2021-02-22: 200 mg via INTRAVENOUS
  Filled 2021-02-22: qty 10

## 2021-02-22 MED ORDER — TRILACICLIB DIHYDROCHLORIDE INJECTION 300 MG
240.0000 mg/m2 | Freq: Once | INTRAVENOUS | Status: AC
  Administered 2021-02-22: 480 mg via INTRAVENOUS
  Filled 2021-02-22: qty 32

## 2021-02-22 MED ORDER — HEPARIN SOD (PORK) LOCK FLUSH 100 UNIT/ML IV SOLN
500.0000 [IU] | Freq: Once | INTRAVENOUS | Status: AC | PRN
  Administered 2021-02-22: 500 [IU]

## 2021-02-22 MED ORDER — SODIUM CHLORIDE 0.9 % IV SOLN
150.0000 mg | Freq: Once | INTRAVENOUS | Status: AC
  Administered 2021-02-22: 150 mg via INTRAVENOUS
  Filled 2021-02-22: qty 150

## 2021-02-22 NOTE — Progress Notes (Signed)
[facility] [phone]   [phone]  OFFICE PROGRESS NOTE  de [place], [name], MD [address] [place] [address]  DIAGNOSIS: Metastatic small cell lung cancer initially diagnosed as Limited stage (T1c, N2, M0) small cell lung cancer diagnosed in February 2022.  PRIOR THERAPY:  1) Systemic chemotherapy according to phase 3 randomized clinical trial of chemoradiation versus chemoradiation plus atezolizumab status post 3 cycles.  This treatment was discontinued secondary to intolerance. 2) palliative radiotherapy to the left hip area under the care of Dr. Tammi Klippel completed January 03, 2021.  CURRENT THERAPY: Second line systemic chemotherapy with carboplatin for AUC of 5 on day 1, etoposide 100 Mg/M2 on days 1, 2 and 3 as well as Imfinzi 1500 Mg IV on day 1 and Cosela 240 Mg/M2 before chemotherapy on days 1, 2 and 3 every 3 weeks.  First dose December 13, 2020.  Status post 3 cycles.  INTERVAL HISTORY: Adam [name] 66 y.o. male returns to the clinic today for follow-up visit.  The patient is feeling fine today with no concerning complaints except for intermittent left lower quadrant abdominal pain as well as constipation.  He uses milk of magnesia with good bowel movement.  The pain in his hip area has significantly improved after the palliative radiotherapy.  He denied having any current chest pain, shortness of breath, cough or hemoptysis.  He denied having any fever or chills.  He has no nausea, vomiting, diarrhea but has intermittent constipation.  He has no headache or visual changes.  The patient had repeat CT scan of the chest, abdomen and pelvis performed recently and he is here for evaluation and discussion of his scan results.  MEDICAL HISTORY: Past Medical History:  Diagnosis Date   Hypertension    Lung cancer ([facility])    small cell RUL    ALLERGIES:  has No Known Allergies.  MEDICATIONS:  Current Outpatient Medications   Medication Sig Dispense Refill   acetaminophen (TYLENOL) 325 MG tablet Take 650 mg by mouth every 6 (six) hours as needed.     amLODipine (NORVASC) 10 MG tablet Take 10 mg by mouth daily.     bisacodyl (DULCOLAX) 5 MG EC tablet Take 5 mg by mouth daily as needed for moderate constipation.     ibuprofen (ADVIL) 600 MG tablet Take 600 mg by mouth every 6 (six) hours as needed.     lidocaine-prilocaine (EMLA) cream Apply 1 application topically as needed. 30 g 2   lisinopril-hydrochlorothiazide (ZESTORETIC) 20-12.5 MG tablet Take 1 tablet by mouth daily. 90 tablet 1   nystatin (MYCOSTATIN) 100000 UNIT/ML suspension Take 5 mLs (500,000 Units total) by mouth 4 (four) times daily. 200 mL 0   No current facility-administered medications for this visit.    SURGICAL HISTORY:  Past Surgical History:  Procedure Laterality Date   BRONCHIAL BRUSHINGS  02/08/2020   Procedure: BRONCHIAL BRUSHINGS;  Surgeon: Freddi Starr, MD;  Location: [facility];  Service: Pulmonary;;   BRONCHIAL NEEDLE ASPIRATION BIOPSY  02/08/2020   Procedure: BRONCHIAL NEEDLE ASPIRATION BIOPSIES;  Surgeon: Freddi Starr, MD;  Location: [facility];  Service: Pulmonary;;   IR IMAGING GUIDED PORT INSERTION  12/30/2020   KNEE SURGERY Right    SHOULDER SURGERY Left    VIDEO BRONCHOSCOPY WITH ENDOBRONCHIAL ULTRASOUND N/A 02/08/2020   Procedure: VIDEO BRONCHOSCOPY WITH ENDOBRONCHIAL ULTRASOUND;  Surgeon: Freddi Starr, MD;  Location: [facility];  Service: Pulmonary;  Laterality: N/A;  REVIEW OF SYSTEMS:  Constitutional: positive for fatigue Eyes: negative Ears, nose, mouth, throat, and face: negative Respiratory: negative Cardiovascular: negative Gastrointestinal: positive for abdominal pain and constipation Genitourinary:negative Integument/breast: negative Hematologic/lymphatic: negative Musculoskeletal:positive for bone pain Neurological: negative Behavioral/Psych: negative Endocrine:  negative Allergic/Immunologic: negative   PHYSICAL EXAMINATION: General appearance: alert, cooperative, fatigued, and no distress Head: Normocephalic, without obvious abnormality, atraumatic Neck: no adenopathy, no JVD, supple, symmetrical, trachea midline, and thyroid not enlarged, symmetric, no tenderness/mass/nodules Lymph nodes: Cervical, supraclavicular, and axillary nodes normal. Resp: clear to auscultation bilaterally Back: symmetric, no curvature. ROM normal. No CVA tenderness. Cardio: regular rate and rhythm, S1, S2 normal, no murmur, click, rub or gallop GI: soft, non-tender; bowel sounds normal; no masses,  no organomegaly Extremities: extremities normal, atraumatic, no cyanosis or edema Neurologic: Alert and oriented X 3, normal strength and tone. Normal symmetric reflexes. Normal coordination and gait  ECOG PERFORMANCE STATUS: 1 - Symptomatic but completely ambulatory  Blood pressure (!) 151/97, pulse (!) 107, temperature (!) 96.3 F (35.7 C), temperature source Tympanic, resp. rate 18, height 5\' 9"  (1.753 m), weight 173 lb 9.6 oz (78.7 kg), SpO2 100 %.  LABORATORY DATA: Lab Results  Component Value Date   WBC 2.8 (L) 02/22/2021   HGB 10.3 (L) 02/22/2021   HCT 31.2 (L) 02/22/2021   MCV 82.3 02/22/2021   PLT 254 02/22/2021      Chemistry      Component Value Date/Time   NA 140 02/08/2021 1024   K 3.9 02/08/2021 1024   CL 106 02/08/2021 1024   CO2 28 02/08/2021 1024   BUN 13 02/08/2021 1024   CREATININE 0.77 02/08/2021 1024      Component Value Date/Time   CALCIUM 9.7 02/08/2021 1024   ALKPHOS 66 02/08/2021 1024   AST 14 (L) 02/08/2021 1024   ALT 16 02/08/2021 1024   BILITOT 0.5 02/08/2021 1024       RADIOGRAPHIC STUDIES: CT Chest W Contrast  Result Date: 02/10/2021 CLINICAL DATA:  Primary Cancer Type: Lung Imaging Indication: Assess response to therapy Interval therapy since last imaging? Yes Initial Cancer Diagnosis Date: 02/08/2020; Established by:  Biopsy-proven Detailed Pathology: Metastatic small cell lung cancer initially diagnosed as Limited stage small cell lung cancer. Primary Tumor location: Right upper lung. Recurrence? Yes; Date(s) of recurrence: 11/16/2020; Established by: Imaging only Surgeries: No. Chemotherapy: Yes; Ongoing? Yes; Most recent administration: 02/01/2021 Immunotherapy?  Yes; Type: Imfinzi; Ongoing? Yes Radiation therapy? Yes Date Range: 12/12/2020 - 01/03/2021; Target: Left hip Date Range: 06/27/2020 - 07/28/2020; Target: Whole brain Date Range: 03/15/2020 - 04/28/2020; Target: Right lung EXAM: CT CHEST, ABDOMEN, AND PELVIS WITH CONTRAST TECHNIQUE: Multidetector CT imaging of the chest, abdomen and pelvis was performed following the standard protocol during bolus administration of intravenous contrast. RADIATION DOSE REDUCTION: This exam was performed according to the departmental dose-optimization program which includes automated exposure control, adjustment of the mA and/or kV according to patient size and/or use of iterative reconstruction technique. CONTRAST:  119mL OMNIPAQUE IOHEXOL 300 MG/ML  SOLN COMPARISON:  Most recent CT chest, abdomen and pelvis 11/15/2020. FINDINGS: CT CHEST FINDINGS Cardiovascular: Heart size is normal. No pericardial effusion. Aortic atherosclerosis and coronary artery calcifications. Mediastinum/Nodes: The thyroid gland, trachea and esophagus are unremarkable. No enlarged axillary or supraclavicular lymph nodes. Soft tissue mass within the middle mediastinum adjacent to the mid esophagus is again noted measuring 2.6 x 3.4 by 2.6 cm (volume = 12 cm^3), image 30/2 and image 113/5. Previously 2.5 x 3.3 by 2.6 cm (volume =  11 cm^3). High right paratracheal lymph node measures 0.9 cm, image 10/2. This is compared with 0.6 cm previously. Adjacent high right paratracheal lymph node measures 0.7 cm, image 11/2. Formally 0.4 cm. Lungs/Pleura: Extensive changes of external beam radiation identified within the  paramediastinal and periapical right upper lobe and superior segment of right lower lobe. Index lesion within the area radiation change in the right apex the index nodule measures 1.5 x 0.9 cm, image 33/6. This is compared with 1.7 x 1.3 cm previously. Index lesion within the central right upper lobe lung measures 2.7 x 1.5 cm, image 24/2. This is compared with 2.1 x 1.2 cm previously. Small volume of pleural fluid overlying the posterior right upper lobe appears similar to the previous exam. Pleural nodule overlying the posterolateral right upper measures 1.2 cm, image 13/2. This is compared with 0.7 cm previously. Pleural base mass with chest wall involvement, overlying the lateral aspect of the right upper lobe measures 1.3 x 3.1 by 2.8 cm (volume = 5.9 cm^3), image 25/2. Previously this measured 2.0 x 2.8 by 2.8 cm (volume = 8.2 cm^3). Musculoskeletal: No aggressive lytic or sclerotic bone lesions. CT ABDOMEN PELVIS FINDINGS Hepatobiliary: Low-attenuation structure in left lobe of liver measures 7 mm, image 52/2. Unchanged from previous exam. Stable too small to characterize low-density structure in segment 4, image 59/2. Gallbladder appears normal. No bile duct dilatation. Pancreas: Unremarkable. No pancreatic ductal dilatation or surrounding inflammatory changes. Spleen: Normal in size without focal abnormality. Adrenals/Urinary Tract: Normal adrenal glands. No kidney mass or hydronephrosis identified. Nonobstructing calculus identified within inferior pole of the left kidney measuring 8 mm, image 71/2. Urinary bladder appears thick walled and partially decompressed, similar to previous exam. Stomach/Bowel: Stomach is within normal limits. Appendix appears normal. Sigmoid diverticulosis identified. Signs of diverticulitis identified within the proximal sigmoid colon with wall thickening and focally inflamed diverticula, image 92/2. No signs of free perforation or abscess formation. No significant free fluid.  Vascular/Lymphatic: Aortic atherosclerosis. No aneurysm. No abdominopelvic adenopathy identified. Reproductive: Prostate is unremarkable. Other: Small fat containing left inguinal hernia. Musculoskeletal: Lytic lesion with pathologic fracture involving the parasymphyseal region of the left pubic bone is again noted. The lytic component measures 2.7 cm, image 112/2. This is compared with 2.2 cm previously. New lytic lesion involving the left inferior pubic rami measures 4.5 cm cranial caudal, image 87/4. IMPRESSION: 1. No significant change in the appearance of index lesions within the right apex and central right upper lobe. Right-sided pleural nodularity with signs of chest wall involvement is not significantly changed compared with the previous exam. 2. Mild increase in size of high right paratracheal lymph nodes. The large nodal mass within the middle mediastinum adjacent to the esophagus is not significantly changed compared with previous exam. 3. 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 There is a new large lytic lesion involving the left inferior pubic rami. Electronically Signed   By: Kerby Moors M.D.   On: 02/10/2021 11:15   CT Abdomen Pelvis W Contrast  Result Date: 02/10/2021 CLINICAL DATA:  Primary Cancer Type: Lung Imaging Indication: Assess response to therapy Interval therapy since last imaging? Yes Initial Cancer Diagnosis Date: 02/08/2020; Established by: Biopsy-proven Detailed Pathology: Metastatic small cell lung cancer initially diagnosed as Limited stage small cell lung cancer. Primary Tumor location: Right upper lung. Recurrence? Yes; Date(s) of recurrence: 11/16/2020; Established by: Imaging only Surgeries: No. Chemotherapy: Yes; Ongoing? Yes; Most recent administration: 02/01/2021  Immunotherapy?  Yes; Type: Imfinzi; Ongoing? Yes Radiation therapy? Yes Date Range: 12/12/2020 - 01/03/2021; Target: Left  hip Date Range: 06/27/2020 - 07/28/2020; Target: Whole brain Date Range: 03/15/2020 - 04/28/2020; Target: Right lung EXAM: CT CHEST, ABDOMEN, AND PELVIS WITH CONTRAST TECHNIQUE: Multidetector CT imaging of the chest, abdomen and pelvis was performed following the standard protocol during bolus administration of intravenous contrast. RADIATION DOSE REDUCTION: This exam was performed according to the departmental dose-optimization program which includes automated exposure control, adjustment of the mA and/or kV according to patient size and/or use of iterative reconstruction technique. CONTRAST:  151mL OMNIPAQUE IOHEXOL 300 MG/ML  SOLN COMPARISON:  Most recent CT chest, abdomen and pelvis 11/15/2020. FINDINGS: CT CHEST FINDINGS Cardiovascular: Heart size is normal. No pericardial effusion. Aortic atherosclerosis and coronary artery calcifications. Mediastinum/Nodes: The thyroid gland, trachea and esophagus are unremarkable. No enlarged axillary or supraclavicular lymph nodes. Soft tissue mass within the middle mediastinum adjacent to the mid esophagus is again noted measuring 2.6 x 3.4 by 2.6 cm (volume = 12 cm^3), image 30/2 and image 113/5. Previously 2.5 x 3.3 by 2.6 cm (volume = 11 cm^3). High right paratracheal lymph node measures 0.9 cm, image 10/2. This is compared with 0.6 cm previously. Adjacent high right paratracheal lymph node measures 0.7 cm, image 11/2. Formally 0.4 cm. Lungs/Pleura: Extensive changes of external beam radiation identified within the paramediastinal and periapical right upper lobe and superior segment of right lower lobe. Index lesion within the area radiation change in the right apex the index nodule measures 1.5 x 0.9 cm, image 33/6. This is compared with 1.7 x 1.3 cm previously. Index lesion within the central right upper lobe lung measures 2.7 x 1.5 cm, image 24/2. This is compared with 2.1 x 1.2 cm previously. Small volume of pleural fluid overlying the posterior right upper lobe  appears similar to the previous exam. Pleural nodule overlying the posterolateral right upper measures 1.2 cm, image 13/2. This is compared with 0.7 cm previously. Pleural base mass with chest wall involvement, overlying the lateral aspect of the right upper lobe measures 1.3 x 3.1 by 2.8 cm (volume = 5.9 cm^3), image 25/2. Previously this measured 2.0 x 2.8 by 2.8 cm (volume = 8.2 cm^3). Musculoskeletal: No aggressive lytic or sclerotic bone lesions. CT ABDOMEN PELVIS FINDINGS Hepatobiliary: Low-attenuation structure in left lobe of liver measures 7 mm, image 52/2. Unchanged from previous exam. Stable too small to characterize low-density structure in segment 4, image 59/2. Gallbladder appears normal. No bile duct dilatation. Pancreas: Unremarkable. No pancreatic ductal dilatation or surrounding inflammatory changes. Spleen: Normal in size without focal abnormality. Adrenals/Urinary Tract: Normal adrenal glands. No kidney mass or hydronephrosis identified. Nonobstructing calculus identified within inferior pole of the left kidney measuring 8 mm, image 71/2. Urinary bladder appears thick walled and partially decompressed, similar to previous exam. Stomach/Bowel: Stomach is within normal limits. Appendix appears normal. Sigmoid diverticulosis identified. Signs of diverticulitis identified within the proximal sigmoid colon with wall thickening and focally inflamed diverticula, image 92/2. No signs of free perforation or abscess formation. No significant free fluid. Vascular/Lymphatic: Aortic atherosclerosis. No aneurysm. No abdominopelvic adenopathy identified. Reproductive: Prostate is unremarkable. Other: Small fat containing left inguinal hernia. Musculoskeletal: Lytic lesion with pathologic fracture involving the parasymphyseal region of the left pubic bone is again noted. The lytic component measures 2.7 cm, image 112/2. This is compared with 2.2 cm previously. New lytic lesion involving the left inferior pubic  rami measures 4.5 cm cranial caudal, image 87/4. IMPRESSION: 1. No  significant change in the appearance of index lesions within the right apex and central right upper lobe. Right-sided pleural nodularity with signs of chest wall involvement is not significantly changed compared with the previous exam. 2. Mild increase in size of high right paratracheal lymph nodes. The large nodal mass within the middle mediastinum adjacent to the esophagus is not significantly changed compared with previous exam. 3. 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 There is a new large lytic lesion involving the left inferior pubic rami. Electronically Signed   By: Kerby Moors M.D.   On: 02/10/2021 11:15    ASSESSMENT AND PLAN: This is a very pleasant 66 years old African-American male recently diagnosed with limited stage small cell lung cancer in February 2022.  The patient is currently undergoing treatment according to the [facility] clinical trial where the patient would be randomized to treatment with concurrent chemoradiation with cisplatin and/or etoposide versus concurrent chemoradiation with the same regimen in addition to atezolizumab. He is status post 3 cycles of the treatment.  His treatment was discontinued secondary to intolerance and the patient has been in observation since April 2022. He had MRI of the brain performed recently that showed no evidence of metastatic disease to the brain. Repeat CT scan of the chest, abdomen and pelvis unfortunately showed evidence for disease progression involving new mass centered in the superior aspect of the right major fissure, widespread nodularity in the adjacent portion of the upper right lung, multiple new pleural-based lesions in the right hemithorax, new middle mediastinal lymphadenopathy, new hypervascular lesion in the proximal body of the pancreas and new lytic lesions in the  parasymphyseal region of the left to pubic bone. His insurance declined a PET scan. The patient is started palliative systemic chemotherapy with carboplatin for AUC of 5 on day 1, etoposide 100 Mg/M2 on days 1, 2 and 3 with Cosela 4 Myeloprotection before chemotherapy and Imfinzi 1500 Mg IV every 3 weeks with the induction phase followed by maintenance Imfinzi every 4 weeks if the patient has no evidence for disease progression.  He is status post 3 cycles.  The patient has been tolerating this treatment well with no significant adverse effect from the chemotherapy. He had repeat CT scan of the chest, abdomen and pelvis performed recently.  I personally and independently reviewed the scan images and discussed the results with the patient today.  His scan showed stable disease except for mild increase and some of the mediastinal lymphadenopathy as well as progression of lytic bone metastasis within the bony pelvis that has increased after the palliative radiation and there was also new lytic lesion in the left anterior pubic rami. I recommended for the patient to continue with the last cycle of his combination systemic chemotherapy and immunotherapy today as planned.  I will continue to monitor the areas of disease progression closely on upcoming imaging studies and if there is any further progression, I will consider changing his treatment to second line chemotherapy likely with Zepzelca (lurbinectedin). The patient agreed to the current plan. He will come back for follow-up visit in 3 weeks for evaluation before starting the first cycle of maintenance treatment with Imfinzi. He was advised to call immediately if he has any other concerning symptoms in the interval. The patient voices understanding of current disease status and treatment options and is in agreement with the current care plan.  All questions were answered. The patient knows to  call the clinic with any problems, questions or concerns. We  can certainly see the patient much sooner if necessary.  The total time spent in the appointment was 35 minutes.  Disclaimer: This note was dictated with voice recognition software. Similar sounding words can inadvertently be transcribed and may not be corrected upon review.

## 2021-02-22 NOTE — Patient Instructions (Signed)
[facility]  Discharge Instructions: Thank you for choosing [facility] to provide your oncology and hematology care.   If you have a lab appointment with the [facility], please go directly to the [facility] and check in at the registration area.   Wear comfortable clothing and clothing appropriate for easy access to any Portacath or PICC line.   We strive to give you quality time with your provider. You may need to reschedule your appointment if you arrive late (15 or more minutes).  Arriving late affects you and other patients whose appointments are after yours.  Also, if you miss three or more appointments without notifying the office, you may be dismissed from the clinic at the providers discretion.      For prescription refill requests, have your pharmacy contact our office and allow 72 hours for refills to be completed.    Today you received the following chemotherapy and/or immunotherapy agents: Cosela, Imfinzi, Carboplatin, and Etoposide      To help prevent nausea and vomiting after your treatment, we encourage you to take your nausea medication as directed.  BELOW ARE SYMPTOMS THAT SHOULD BE REPORTED IMMEDIATELY: *FEVER GREATER THAN 100.4 F (38 C) OR HIGHER *CHILLS OR SWEATING *NAUSEA AND VOMITING THAT IS NOT CONTROLLED WITH YOUR NAUSEA MEDICATION *UNUSUAL SHORTNESS OF BREATH *UNUSUAL BRUISING OR BLEEDING *URINARY PROBLEMS (pain or burning when urinating, or frequent urination) *BOWEL PROBLEMS (unusual diarrhea, constipation, pain near the anus) TENDERNESS IN MOUTH AND THROAT WITH OR WITHOUT PRESENCE OF ULCERS (sore throat, sores in mouth, or a toothache) UNUSUAL RASH, SWELLING OR PAIN  UNUSUAL VAGINAL DISCHARGE OR ITCHING   Items with * indicate a potential emergency and should be followed up as soon as possible or go to the Emergency Department if any problems should occur.  Please show the CHEMOTHERAPY ALERT CARD or  IMMUNOTHERAPY ALERT CARD at check-in to the Emergency Department and triage nurse.  Should you have questions after your visit or need to cancel or reschedule your appointment, please contact [facility]  Dept: [phone]  and follow the prompts.  Office hours are 8:00 a.m. to 4:30 p.m. Monday - Friday. Please note that voicemails left after 4:00 p.m. may not be returned until the following business day.  We are closed weekends and major holidays. You have access to a nurse at all times for urgent questions. Please call the main number to the clinic Dept: [phone] and follow the prompts.   For any non-urgent questions, you may also contact your provider using MyChart. We now offer e-Visits for anyone 64 and older to request care online for non-urgent symptoms. For details visit [email or web address].   Also download the MyChart app! Go to the app store, search "MyChart", open the app, select [facility], and log in with your MyChart username and password.  Due to Covid, a mask is required upon entering the hospital/clinic. If you do not have a mask, one will be given to you upon arrival. For doctor visits, patients may have 1 support person aged 64 or older with them. For treatment visits, patients cannot have anyone with them due to current Covid guidelines and our immunocompromised population.

## 2021-02-22 NOTE — Progress Notes (Signed)
Nutrition Follow-up:  Patient currently receiving carboplatin + durvalumab for metastatic small cell lung cancer.   Met with patient during infusion. He reports he continues to be without a car. Patient reports his cousin has been helpful in getting him to the store and appointments when needed. Patient reports his appetite is good and is eating well. Patient continues to drink one Premier Protein. He is drinking 2 bottles of water. He denies nausea, vomiting, diarrhea. Patient has mild constipation. He reports last bowel movement was yesterday s/p milk of magnesia.    Medications: reviewed   Labs: glucose 107, BUN 7  Anthropometrics: Weight 173 lb 9.6 oz today increased   1/25 - 170 lb 3.2 oz    NUTRITION DIAGNOSIS: Inadequate oral intake improving    INTERVENTION:  Encouraged small frequent meals and snacks with high calorie high protein foods Suggested drinking Ensure Plus/equivalent for added calories, recommend 2x/day One complimentary case of Ensure Enlive High Protein provided  Discussed strategies for constipation - handout provided Patient will work to increase intake of water, recommend 4 bottles/day Patient has contact information    MONITORING, EVALUATION, GOAL: weight trends, intake    NEXT VISIT: Wednesday April 5 during infusion

## 2021-02-22 NOTE — Research (Signed)
TRIAL: NRG-LU005-Limited Stage Small Cell Lung Cancer (LS-SCLC): A Phase II/III Randomized Study of Chemoradiation Versus Chemoradiation Plus Atezolizumab   Patient arrives today (02/22/2021) unaccompanied for the 9 month post End of CRT visit.    LABS: Used SOC labs drawn today for assessment (CMP, TSH, and CBC).   MEDICATION REVIEW: Patient reviews and verifies the current medication list is correct.   VITAL SIGNS: Vital signs were collected today during MD [facility] visit, see visit note for details.   MD/PROVIDER VISIT: Patient sees MD [facility] for today's visit to discuss results of CT Chest/Abd/Pelvis done on 02/10/2021 and to f/u on how he is doing while on [facility] treatment.  Per MD Julien Nordmann shows stable disease (see MD note for further details). Patient completed palliative RT to left hip with Dr. Tammi Klippel on 01/03/21, reports almost complete resolution of L hip pain since then.  Patient currently on carboplatin, etoposide, and imfinzi as treatment post initial progression.   ADVERSE EVENTS: All assessed using CTCAE version 5.0 Patient Adam [name] has a baseline history of hypertension managed with medication, Grade 2 today with BP of 151/97.  Patient also has baseline history of anemia, Grade 1 today with Hgb 10.3. CBC today revealed WBC of 2.8, which is a Grade 1 white blood cell decrease, and ANC of 1.3, which is a Grade 2 neutrophil count decrease.  No intervention at this time. CMP today revealed hyperglycemia (Grade 1).  No intervention at this time. Patient reports ongoing but mostly resolved left hip pain (arthralgia), Grade 1.  Pt no longer using cane to ambulate. Patient reports heart palpitations resolved about a month after last Research appt. Patient does not report any other AEs at this time.  All other abnormal labs from today (RBC decreased, HCT decreased, RDW increased, absolute lymphocytes decreased, AST decreased, and BUN decreased) have all been ruled as clinically irrelevant  by MD [facility].  Reviewed all above labs and AE's with MD Julien Nordmann who ruled that all are unrelated to study, no action or reporting necessary at this time.   [name] [id number]   02/22/2021   Adverse Event Log   Study/Protocol: NRG LU005 Cycle: 9 months post End of CRT   Event Grade Onset Date Resolved Date Drug Name Attribution Treatment Comments  Hyperglycemia Grade 1 02/22/21 Ongoing None Unrelated None No intervention required  White blood cell decrease Grade 1 11/22/20 Ongoing None Unrelated None No intervention required  Neutrophil count decrease Grade 1 11/22/20 Ongoing None Unrelated None No intervention required  Arthralgia Grade 2 11/15/20 Ongoing None Unrelated None No intervention required  Heart palpitations Grade 1 11/15/20 12/15/20 Resolved None Unrelated None No intervention required     DISPOSITION: Upon completion off all study requirements, patient was escorted to infusion suite for [facility] treatment with belongings.   The patient was thanked for their time and continued voluntary participation in this study. Patient Adam [name] has been provided direct contact information and is encouraged to contact this Nurse for any needs or questions.  Wells Guiles 'Learta CoddingNeysa Bonito, RN, BSN Clinical Research Nurse I 02/23/21 3:56 PM

## 2021-02-22 NOTE — Progress Notes (Signed)
Per Dr. Julien Nordmann, ok to treat with ANC 1.3

## 2021-02-23 ENCOUNTER — Encounter: Payer: Self-pay | Admitting: Internal Medicine

## 2021-02-23 ENCOUNTER — Inpatient Hospital Stay: Payer: Managed Care, Other (non HMO)

## 2021-02-23 VITALS — BP 148/95 | HR 97 | Temp 97.8°F | Resp 18

## 2021-02-23 DIAGNOSIS — C3411 Malignant neoplasm of upper lobe, right bronchus or lung: Secondary | ICD-10-CM

## 2021-02-23 DIAGNOSIS — Z5111 Encounter for antineoplastic chemotherapy: Secondary | ICD-10-CM | POA: Diagnosis not present

## 2021-02-23 MED ORDER — SODIUM CHLORIDE 0.9 % IV SOLN
10.0000 mg | Freq: Once | INTRAVENOUS | Status: AC
  Administered 2021-02-23: 10 mg via INTRAVENOUS
  Filled 2021-02-23: qty 10

## 2021-02-23 MED ORDER — TRILACICLIB DIHYDROCHLORIDE INJECTION 300 MG
240.0000 mg/m2 | Freq: Once | INTRAVENOUS | Status: AC
  Administered 2021-02-23: 480 mg via INTRAVENOUS
  Filled 2021-02-23: qty 32

## 2021-02-23 MED ORDER — SODIUM CHLORIDE 0.9 % IV SOLN: Freq: Once | INTRAVENOUS | Status: AC

## 2021-02-23 MED ORDER — SODIUM CHLORIDE 0.9 % IV SOLN
100.0000 mg/m2 | Freq: Once | INTRAVENOUS | Status: AC
  Administered 2021-02-23: 200 mg via INTRAVENOUS
  Filled 2021-02-23: qty 10

## 2021-02-23 MED ORDER — HEPARIN SOD (PORK) LOCK FLUSH 100 UNIT/ML IV SOLN
500.0000 [IU] | Freq: Once | INTRAVENOUS | Status: AC | PRN
  Administered 2021-02-23: 500 [IU]

## 2021-02-23 MED ORDER — SODIUM CHLORIDE 0.9% FLUSH
10.0000 mL | INTRAVENOUS | Status: DC | PRN
  Administered 2021-02-23: 10 mL

## 2021-02-23 MED FILL — Dexamethasone Sodium Phosphate Inj 100 MG/10ML: INTRAMUSCULAR | Qty: 1 | Status: AC

## 2021-02-23 NOTE — Patient Instructions (Signed)
[facility]COL[facility]  Discharge Instructions: Thank you for choosing [facility] to provide your oncology and hematology care.   If you have a lab appointment with the [facility], please go directly to the [facility] and check in at the registration area.   Wear comfortable clothing and clothing appropriate for easy access to any Portacath or PICC line.   We strive to give you quality time with your provider. You may need to reschedule your appointment if you arrive late (15 or more minutes).  Arriving late affects you and other patients whose appointments are after yours.  Also, if you miss three or more appointments without notifying the office, you may be dismissed from the clinic at the providers discretion.      For prescription refill requests, have your pharmacy contact our office and allow 72 hours for refills to be completed.    Today you received the following chemotherapy and/or immunotherapy agents: Cosela and Etoposide.      To help prevent nausea and vomiting after your treatment, we encourage you to take your nausea medication as directed.  BELOW ARE SYMPTOMS THAT SHOULD BE REPORTED IMMEDIATELY: *FEVER GREATER THAN 100.4 F (38 C) OR HIGHER *CHILLS OR SWEATING *NAUSEA AND VOMITING THAT IS NOT CONTROLLED WITH YOUR NAUSEA MEDICATION *UNUSUAL SHORTNESS OF BREATH *UNUSUAL BRUISING OR BLEEDING *URINARY PROBLEMS (pain or burning when urinating, or frequent urination) *BOWEL PROBLEMS (unusual diarrhea, constipation, pain near the anus) TENDERNESS IN MOUTH AND THROAT WITH OR WITHOUT PRESENCE OF ULCERS (sore throat, sores in mouth, or a toothache) UNUSUAL RASH, SWELLING OR PAIN  UNUSUAL VAGINAL DISCHARGE OR ITCHING   Items with * indicate a potential emergency and should be followed up as soon as possible or go to the Emergency Department if any problems should occur.  Please show the CHEMOTHERAPY ALERT CARD or IMMUNOTHERAPY ALERT CARD at  check-in to the Emergency Department and triage nurse.  Should you have questions after your visit or need to cancel or reschedule your appointment, please contact [facility]  Dept: [phone]  and follow the prompts.  Office hours are 8:00 a.m. to 4:30 p.m. Monday - Friday. Please note that voicemails left after 4:00 p.m. may not be returned until the following business day.  We are closed weekends and major holidays. You have access to a nurse at all times for urgent questions. Please call the main number to the clinic Dept: [phone] and follow the prompts.   For any non-urgent questions, you may also contact your provider using MyChart. We now offer e-Visits for anyone 31 and older to request care online for non-urgent symptoms. For details visit [email or web address].   Also download the MyChart app! Go to the app store, search "MyChart", open the app, select [facility], and log in with your MyChart username and password.  Due to Covid, a mask is required upon entering the hospital/clinic. If you do not have a mask, one will be given to you upon arrival. For doctor visits, patients may have 1 support person aged 1 or older with them. For treatment visits, patients cannot have anyone with them due to current Covid guidelines and our immunocompromised population.

## 2021-02-24 ENCOUNTER — Inpatient Hospital Stay: Payer: Managed Care, Other (non HMO)

## 2021-02-24 ENCOUNTER — Other Ambulatory Visit: Payer: Self-pay

## 2021-02-24 VITALS — BP 110/72 | HR 98 | Temp 97.9°F | Resp 18

## 2021-02-24 DIAGNOSIS — C3411 Malignant neoplasm of upper lobe, right bronchus or lung: Secondary | ICD-10-CM

## 2021-02-24 DIAGNOSIS — Z5111 Encounter for antineoplastic chemotherapy: Secondary | ICD-10-CM | POA: Diagnosis not present

## 2021-02-24 MED ORDER — SODIUM CHLORIDE 0.9 % IV SOLN
100.0000 mg/m2 | Freq: Once | INTRAVENOUS | Status: AC
  Administered 2021-02-24: 200 mg via INTRAVENOUS
  Filled 2021-02-24: qty 10

## 2021-02-24 MED ORDER — TRILACICLIB DIHYDROCHLORIDE INJECTION 300 MG
240.0000 mg/m2 | Freq: Once | INTRAVENOUS | Status: AC
  Administered 2021-02-24: 480 mg via INTRAVENOUS
  Filled 2021-02-24: qty 32

## 2021-02-24 MED ORDER — SODIUM CHLORIDE 0.9 % IV SOLN: Freq: Once | INTRAVENOUS | Status: AC

## 2021-02-24 MED ORDER — SODIUM CHLORIDE 0.9 % IV SOLN
10.0000 mg | Freq: Once | INTRAVENOUS | Status: AC
  Administered 2021-02-24: 10 mg via INTRAVENOUS
  Filled 2021-02-24: qty 10

## 2021-02-24 NOTE — Research (Signed)
TRIAL: NRG-LU005-Limited Stage Small Cell Lung Cancer (LS-SCLC): A Phase II/III Randomized Study of Chemoradiation Versus Chemoradiation Plus Atezolizumab  LATE ENTRY  Solicited adverse events (pneumonitis, pericarditis, esophagitis, encephalitis) were asked during appt on 02/22/21, pt denied all.  No action or reporting at this time required.  Wells Guiles 'Learta CoddingNeysa Bonito, RN, BSN Clinical Research Nurse I 02/24/21 2:15 PM

## 2021-03-01 ENCOUNTER — Inpatient Hospital Stay: Payer: Managed Care, Other (non HMO)

## 2021-03-01 ENCOUNTER — Other Ambulatory Visit: Payer: Managed Care, Other (non HMO)

## 2021-03-01 ENCOUNTER — Other Ambulatory Visit: Payer: Self-pay

## 2021-03-01 ENCOUNTER — Telehealth: Payer: Self-pay

## 2021-03-01 DIAGNOSIS — Z5111 Encounter for antineoplastic chemotherapy: Secondary | ICD-10-CM | POA: Diagnosis not present

## 2021-03-01 DIAGNOSIS — Z95828 Presence of other vascular implants and grafts: Secondary | ICD-10-CM

## 2021-03-01 DIAGNOSIS — C3411 Malignant neoplasm of upper lobe, right bronchus or lung: Secondary | ICD-10-CM

## 2021-03-01 LAB — CBC WITH DIFFERENTIAL (CANCER CENTER ONLY)
Abs Immature Granulocytes: 0 10*3/uL (ref 0.00–0.07)
Band Neutrophils: 1 %
Basophils Absolute: 0 10*3/uL (ref 0.0–0.1)
Basophils Relative: 1 %
Eosinophils Absolute: 0.1 10*3/uL (ref 0.0–0.5)
Eosinophils Relative: 6 %
HCT: 28.4 % — ABNORMAL LOW (ref 39.0–52.0)
Hemoglobin: 9.2 g/dL — ABNORMAL LOW (ref 13.0–17.0)
Lymphocytes Relative: 30 %
Lymphs Abs: 0.5 10*3/uL — ABNORMAL LOW (ref 0.7–4.0)
MCH: 27.1 pg (ref 26.0–34.0)
MCHC: 32.4 g/dL (ref 30.0–36.0)
MCV: 83.5 fL (ref 80.0–100.0)
Monocytes Absolute: 0 10*3/uL — ABNORMAL LOW (ref 0.1–1.0)
Monocytes Relative: 2 %
Myelocytes: 1 %
Neutro Abs: 1 10*3/uL — ABNORMAL LOW (ref 1.7–7.7)
Neutrophils Relative %: 59 %
Platelet Count: 193 10*3/uL (ref 150–400)
RBC: 3.4 MIL/uL — ABNORMAL LOW (ref 4.22–5.81)
RDW: 18.4 % — ABNORMAL HIGH (ref 11.5–15.5)
Smear Review: NORMAL
WBC Count: 1.7 10*3/uL — ABNORMAL LOW (ref 4.0–10.5)
nRBC: 0 % (ref 0.0–0.2)

## 2021-03-01 LAB — CMP (CANCER CENTER ONLY)
ALT: 18 U/L (ref 0–44)
AST: 15 U/L (ref 15–41)
Albumin: 3.5 g/dL (ref 3.5–5.0)
Alkaline Phosphatase: 65 U/L (ref 38–126)
Anion gap: 5 (ref 5–15)
BUN: 14 mg/dL (ref 8–23)
CO2: 30 mmol/L (ref 22–32)
Calcium: 9.3 mg/dL (ref 8.9–10.3)
Chloride: 105 mmol/L (ref 98–111)
Creatinine: 0.74 mg/dL (ref 0.61–1.24)
GFR, Estimated: >60 mL/min (ref >60)
Glucose, Bld: 91 mg/dL (ref 70–99)
Potassium: 3.7 mmol/L (ref 3.5–5.1)
Sodium: 140 mmol/L (ref 135–145)
Total Bilirubin: 0.4 mg/dL (ref 0.3–1.2)
Total Protein: 6.3 g/dL — ABNORMAL LOW (ref 6.5–8.1)

## 2021-03-01 MED ORDER — HEPARIN SOD (PORK) LOCK FLUSH 100 UNIT/ML IV SOLN
500.0000 [IU] | INTRAVENOUS | Status: AC | PRN
  Administered 2021-03-01: 500 [IU]

## 2021-03-01 MED ORDER — SODIUM CHLORIDE 0.9% FLUSH
10.0000 mL | INTRAVENOUS | Status: AC | PRN
  Administered 2021-03-01: 10 mL

## 2021-03-01 NOTE — Telephone Encounter (Signed)
-----   Message from [employer], PA-C sent at 03/01/2021  2:25 PM EST ----- Can you call him and review neutropenic precautions with him ----- Message ----- From: Interface, Lab In [place] Sent: 03/01/2021   9:18 AM EST To: Cassandra L Heilingoetter, PA-C

## 2021-03-01 NOTE — Telephone Encounter (Signed)
I spoke with pt and advised of his results. Neutropenic precautions were reviewed with him. He has been advised to practice good hygiene (as well as those who come and live in his home), staying away from crowds, and avoiding food that might have germs (i.e. under cooked meat). He was further advised that any sign of infection should be taken seriously and to go to an emergency room if he has symptoms like fever, diarrhea, or chills. Pt expressed understanding of this information.

## 2021-03-08 ENCOUNTER — Other Ambulatory Visit: Payer: 59

## 2021-03-08 ENCOUNTER — Other Ambulatory Visit: Payer: Managed Care, Other (non HMO)

## 2021-03-08 ENCOUNTER — Ambulatory Visit: Payer: 59 | Admitting: Internal Medicine

## 2021-03-08 ENCOUNTER — Inpatient Hospital Stay: Payer: Commercial Managed Care - HMO | Attending: Hematology and Oncology

## 2021-03-08 ENCOUNTER — Ambulatory Visit: Payer: 59

## 2021-03-08 ENCOUNTER — Other Ambulatory Visit: Payer: Self-pay

## 2021-03-08 DIAGNOSIS — R197 Diarrhea, unspecified: Secondary | ICD-10-CM | POA: Insufficient documentation

## 2021-03-08 DIAGNOSIS — E860 Dehydration: Secondary | ICD-10-CM | POA: Insufficient documentation

## 2021-03-08 DIAGNOSIS — Z79899 Other long term (current) drug therapy: Secondary | ICD-10-CM | POA: Diagnosis not present

## 2021-03-08 DIAGNOSIS — C3411 Malignant neoplasm of upper lobe, right bronchus or lung: Secondary | ICD-10-CM | POA: Insufficient documentation

## 2021-03-08 DIAGNOSIS — I10 Essential (primary) hypertension: Secondary | ICD-10-CM | POA: Insufficient documentation

## 2021-03-08 DIAGNOSIS — Z95828 Presence of other vascular implants and grafts: Secondary | ICD-10-CM

## 2021-03-08 DIAGNOSIS — Z5111 Encounter for antineoplastic chemotherapy: Secondary | ICD-10-CM | POA: Insufficient documentation

## 2021-03-08 LAB — CBC WITH DIFFERENTIAL (CANCER CENTER ONLY)
Abs Immature Granulocytes: 0 10*3/uL (ref 0.00–0.07)
Basophils Absolute: 0 10*3/uL (ref 0.0–0.1)
Basophils Relative: 1 %
Eosinophils Absolute: 0.2 10*3/uL (ref 0.0–0.5)
Eosinophils Relative: 12 %
HCT: 29.9 % — ABNORMAL LOW (ref 39.0–52.0)
Hemoglobin: 9.5 g/dL — ABNORMAL LOW (ref 13.0–17.0)
Immature Granulocytes: 0 %
Lymphocytes Relative: 25 %
Lymphs Abs: 0.4 10*3/uL — ABNORMAL LOW (ref 0.7–4.0)
MCH: 27.1 pg (ref 26.0–34.0)
MCHC: 31.8 g/dL (ref 30.0–36.0)
MCV: 85.2 fL (ref 80.0–100.0)
Monocytes Absolute: 0.4 10*3/uL (ref 0.1–1.0)
Monocytes Relative: 26 %
Neutro Abs: 0.6 10*3/uL — ABNORMAL LOW (ref 1.7–7.7)
Neutrophils Relative %: 36 %
Platelet Count: 98 10*3/uL — ABNORMAL LOW (ref 150–400)
RBC: 3.51 MIL/uL — ABNORMAL LOW (ref 4.22–5.81)
RDW: 19.6 % — ABNORMAL HIGH (ref 11.5–15.5)
WBC Count: 1.6 10*3/uL — ABNORMAL LOW (ref 4.0–10.5)
nRBC: 0 % (ref 0.0–0.2)

## 2021-03-08 LAB — CMP (CANCER CENTER ONLY)
ALT: 10 U/L (ref 0–44)
AST: 12 U/L — ABNORMAL LOW (ref 15–41)
Albumin: 3.8 g/dL (ref 3.5–5.0)
Alkaline Phosphatase: 76 U/L (ref 38–126)
Anion gap: 6 (ref 5–15)
BUN: 8 mg/dL (ref 8–23)
CO2: 28 mmol/L (ref 22–32)
Calcium: 9.8 mg/dL (ref 8.9–10.3)
Chloride: 106 mmol/L (ref 98–111)
Creatinine: 0.81 mg/dL (ref 0.61–1.24)
GFR, Estimated: >60 mL/min (ref >60)
Glucose, Bld: 90 mg/dL (ref 70–99)
Potassium: 3.7 mmol/L (ref 3.5–5.1)
Sodium: 140 mmol/L (ref 135–145)
Total Bilirubin: 0.5 mg/dL (ref 0.3–1.2)
Total Protein: 6.9 g/dL (ref 6.5–8.1)

## 2021-03-08 MED ORDER — HEPARIN SOD (PORK) LOCK FLUSH 100 UNIT/ML IV SOLN
500.0000 [IU] | INTRAVENOUS | Status: AC | PRN
  Administered 2021-03-08: 500 [IU]

## 2021-03-08 MED ORDER — SODIUM CHLORIDE 0.9% FLUSH
10.0000 mL | INTRAVENOUS | Status: AC | PRN
  Administered 2021-03-08: 10 mL

## 2021-03-15 ENCOUNTER — Inpatient Hospital Stay: Payer: Commercial Managed Care - HMO | Admitting: Internal Medicine

## 2021-03-15 ENCOUNTER — Inpatient Hospital Stay: Payer: Commercial Managed Care - HMO

## 2021-03-15 ENCOUNTER — Other Ambulatory Visit: Payer: Self-pay

## 2021-03-15 ENCOUNTER — Other Ambulatory Visit: Payer: Self-pay | Admitting: Internal Medicine

## 2021-03-15 VITALS — BP 122/90 | HR 116 | Temp 96.9°F | Resp 18 | Ht 69.0 in | Wt 169.1 lb

## 2021-03-15 DIAGNOSIS — E860 Dehydration: Secondary | ICD-10-CM

## 2021-03-15 DIAGNOSIS — Z95828 Presence of other vascular implants and grafts: Secondary | ICD-10-CM

## 2021-03-15 DIAGNOSIS — C3490 Malignant neoplasm of unspecified part of unspecified bronchus or lung: Secondary | ICD-10-CM

## 2021-03-15 DIAGNOSIS — C3411 Malignant neoplasm of upper lobe, right bronchus or lung: Secondary | ICD-10-CM

## 2021-03-15 DIAGNOSIS — Z5112 Encounter for antineoplastic immunotherapy: Secondary | ICD-10-CM

## 2021-03-15 LAB — CMP (CANCER CENTER ONLY)
ALT: 8 U/L (ref 0–44)
AST: 11 U/L — ABNORMAL LOW (ref 15–41)
Albumin: 3.8 g/dL (ref 3.5–5.0)
Alkaline Phosphatase: 70 U/L (ref 38–126)
Anion gap: 7 (ref 5–15)
BUN: 12 mg/dL (ref 8–23)
CO2: 28 mmol/L (ref 22–32)
Calcium: 9.8 mg/dL (ref 8.9–10.3)
Chloride: 106 mmol/L (ref 98–111)
Creatinine: 0.87 mg/dL (ref 0.61–1.24)
GFR, Estimated: >60 mL/min (ref >60)
Glucose, Bld: 116 mg/dL — ABNORMAL HIGH (ref 70–99)
Potassium: 3.6 mmol/L (ref 3.5–5.1)
Sodium: 141 mmol/L (ref 135–145)
Total Bilirubin: 0.4 mg/dL (ref 0.3–1.2)
Total Protein: 7.2 g/dL (ref 6.5–8.1)

## 2021-03-15 LAB — CBC WITH DIFFERENTIAL (CANCER CENTER ONLY)
Abs Immature Granulocytes: 0.04 10*3/uL (ref 0.00–0.07)
Basophils Absolute: 0 10*3/uL (ref 0.0–0.1)
Basophils Relative: 1 %
Eosinophils Absolute: 0.3 10*3/uL (ref 0.0–0.5)
Eosinophils Relative: 6 %
HCT: 32.4 % — ABNORMAL LOW (ref 39.0–52.0)
Hemoglobin: 10.4 g/dL — ABNORMAL LOW (ref 13.0–17.0)
Immature Granulocytes: 1 %
Lymphocytes Relative: 15 %
Lymphs Abs: 0.7 10*3/uL (ref 0.7–4.0)
MCH: 27.6 pg (ref 26.0–34.0)
MCHC: 32.1 g/dL (ref 30.0–36.0)
MCV: 85.9 fL (ref 80.0–100.0)
Monocytes Absolute: 0.8 10*3/uL (ref 0.1–1.0)
Monocytes Relative: 19 %
Neutro Abs: 2.5 10*3/uL (ref 1.7–7.7)
Neutrophils Relative %: 58 %
Platelet Count: 283 10*3/uL (ref 150–400)
RBC: 3.77 MIL/uL — ABNORMAL LOW (ref 4.22–5.81)
RDW: 20.5 % — ABNORMAL HIGH (ref 11.5–15.5)
WBC Count: 4.3 10*3/uL (ref 4.0–10.5)
nRBC: 0.5 % — ABNORMAL HIGH (ref 0.0–0.2)

## 2021-03-15 LAB — TSH: TSH: 0.942 u[IU]/mL (ref 0.320–4.118)

## 2021-03-15 MED ORDER — SODIUM CHLORIDE 0.9% FLUSH
10.0000 mL | INTRAVENOUS | Status: DC | PRN
  Administered 2021-03-15: 10 mL

## 2021-03-15 MED ORDER — SODIUM CHLORIDE 0.9 % IV SOLN: Freq: Once | INTRAVENOUS | Status: AC

## 2021-03-15 MED ORDER — SODIUM CHLORIDE 0.9 % IV SOLN: INTRAVENOUS | Status: DC

## 2021-03-15 MED ORDER — SODIUM CHLORIDE 0.9 % IV SOLN
1500.0000 mg | Freq: Once | INTRAVENOUS | Status: AC
  Administered 2021-03-15: 1500 mg via INTRAVENOUS
  Filled 2021-03-15: qty 30

## 2021-03-15 MED ORDER — HEPARIN SOD (PORK) LOCK FLUSH 100 UNIT/ML IV SOLN
500.0000 [IU] | Freq: Once | INTRAVENOUS | Status: AC | PRN
  Administered 2021-03-15: 500 [IU]

## 2021-03-15 MED ORDER — SODIUM CHLORIDE 0.9% FLUSH
10.0000 mL | INTRAVENOUS | Status: AC | PRN
  Administered 2021-03-15: 10 mL

## 2021-03-15 NOTE — Patient Instructions (Signed)
[facility]   Discharge Instructions: Thank you for choosing [facility] to provide your oncology and hematology care.   If you have a lab appointment with the Cancer Center, please go directly to the Cancer Center and check in at the registration area.   Wear comfortable clothing and clothing appropriate for easy access to any Portacath or PICC line.   We strive to give you quality time with your provider. You may need to reschedule your appointment if you arrive late (15 or more minutes).  Arriving late affects you and other patients whose appointments are after yours.  Also, if you miss three or more appointments without notifying the office, you may be dismissed from the clinic at the provider's discretion.      For prescription refill requests, have your pharmacy contact our office and allow 72 hours for refills to be completed.    Today you received the following chemotherapy and/or immunotherapy agents: Durvalumab (Imfinzi).      To help prevent nausea and vomiting after your treatment, we encourage you to take your nausea medication as directed.  BELOW ARE SYMPTOMS THAT SHOULD BE REPORTED IMMEDIATELY: *FEVER GREATER THAN 100.4 F (38 C) OR HIGHER *CHILLS OR SWEATING *NAUSEA AND VOMITING THAT IS NOT CONTROLLED WITH YOUR NAUSEA MEDICATION *UNUSUAL SHORTNESS OF BREATH *UNUSUAL BRUISING OR BLEEDING *URINARY PROBLEMS (pain or burning when urinating, or frequent urination) *BOWEL PROBLEMS (unusual diarrhea, constipation, pain near the anus) TENDERNESS IN MOUTH AND THROAT WITH OR WITHOUT PRESENCE OF ULCERS (sore throat, sores in mouth, or a toothache) UNUSUAL RASH, SWELLING OR PAIN  UNUSUAL VAGINAL DISCHARGE OR ITCHING   Items with * indicate a potential emergency and should be followed up as soon as possible or go to the Emergency Department if any problems should occur.  Please show the CHEMOTHERAPY ALERT CARD or IMMUNOTHERAPY ALERT CARD  at check-in to the Emergency Department and triage nurse.  Should you have questions after your visit or need to cancel or reschedule your appointment, please contact [facility]  Dept: [phone]  and follow the prompts.  Office hours are 8:00 a.m. to 4:30 p.m. Monday - Friday. Please note that voicemails left after 4:00 p.m. may not be returned until the following business day.  We are closed weekends and major holidays. You have access to a nurse at all times for urgent questions. Please call the main number to the clinic Dept: [phone] and follow the prompts.   For any non-urgent questions, you may also contact your provider using MyChart. We now offer e-Visits for anyone 18 and older to request care online for non-urgent symptoms. For details visit [email or web address].   Also download the MyChart app! Go to the app store, search "MyChart", open the app, select [facility], and log in with your MyChart username and password.  Due to Covid, a mask is required upon entering the hospital/clinic. If you do not have a mask, one will be given to you upon arrival. For doctor visits, patients may have 1 support person aged 18 or older with them. For treatment visits, patients cannot have anyone with them due to current Covid guidelines and our immunocompromised population.   

## 2021-03-15 NOTE — Progress Notes (Signed)
Per Dr. Julien Nordmann ,it is ok to treat pt today with Etoposide , alimta and Imfinzi and heart rate of  ?116. ?

## 2021-03-15 NOTE — Addendum Note (Signed)
Addended by: Ardeen Garland on: 03/15/2021 09:43 AM ? ? Modules accepted: Orders ? ?

## 2021-03-15 NOTE — Progress Notes (Signed)
?    [facility] ?Telephone:([phone]   [phone] ? ?OFFICE PROGRESS NOTE ? ?[place], Blondell Reveal, MD ?[phone] ?[place] [address] ? ?DIAGNOSIS: Metastatic small cell lung cancer initially diagnosed as Limited stage (T1c, N2, M0) small cell lung cancer diagnosed in February 2022. ? ?PRIOR THERAPY:  ?1) Systemic chemotherapy according to phase 3 randomized clinical trial of chemoradiation versus chemoradiation plus atezolizumab status post 3 cycles.  This treatment was discontinued secondary to intolerance. ?2) palliative radiotherapy to the left hip area under the care of Dr. Tammi Klippel completed January 03, 2021. ? ?CURRENT THERAPY: Second line systemic chemotherapy with carboplatin for AUC of 5 on day 1, etoposide 100 Mg/M2 on days 1, 2 and 3 as well as Imfinzi 1500 Mg IV on day 1 and Cosela 240 Mg/M2 before chemotherapy on days 1, 2 and 3 every 3 weeks.  First dose December 13, 2020.  Status post 4 cycles.  Starting from cycle #5 the patient will be on maintenance treatment with Imfinzi every 4 weeks. ? ?INTERVAL HISTORY: ?Adam [name] 66 y.o. male returns to the clinic today for follow-up visit.  The patient is feeling fine today with no concerning complaints except for few episodes of diarrhea the last few days.  He did not take any over-the-counter medication for it.  He has no current chest pain, shortness of breath, cough or hemoptysis.  He denied having any nausea, vomiting or constipation.  He has no headache or visual changes.  He lost few pounds recently.  He is here today for evaluation before starting the first cycle of maintenance treatment with Imfinzi. ? ?MEDICAL HISTORY: ?Past Medical History:  ?Diagnosis Date  ? Hypertension   ? Lung cancer ([place])   ? small cell RUL  ? ? ?ALLERGIES:  has No Known Allergies. ? ?MEDICATIONS:  ?Current Outpatient Medications  ?Medication Sig Dispense Refill  ? acetaminophen (TYLENOL) 325 MG tablet Take 650 mg by mouth every 6  (six) hours as needed.    ? amLODipine (NORVASC) 10 MG tablet Take 10 mg by mouth daily.    ? bisacodyl (DULCOLAX) 5 MG EC tablet Take 5 mg by mouth daily as needed for moderate constipation.    ? ibuprofen (ADVIL) 600 MG tablet Take 600 mg by mouth every 6 (six) hours as needed.    ? lidocaine-prilocaine (EMLA) cream Apply 1 application topically as needed. 30 g 2  ? lisinopril-hydrochlorothiazide (ZESTORETIC) 20-12.5 MG tablet Take 1 tablet by mouth daily. 90 tablet 1  ? nystatin (MYCOSTATIN) 100000 UNIT/ML suspension Take 5 mLs (500,000 Units total) by mouth 4 (four) times daily. 200 mL 0  ? ?No current facility-administered medications for this visit.  ? ? ?SURGICAL HISTORY:  ?Past Surgical History:  ?Procedure Laterality Date  ? BRONCHIAL BRUSHINGS  02/08/2020  ? Procedure: BRONCHIAL BRUSHINGS;  Surgeon: Freddi Starr, MD;  Location: [facility] ENDOSCOPY;  Service: Pulmonary;;  ? BRONCHIAL NEEDLE ASPIRATION BIOPSY  02/08/2020  ? Procedure: BRONCHIAL NEEDLE ASPIRATION BIOPSIES;  Surgeon: Freddi Starr, MD;  Location: [facility] ENDOSCOPY;  Service: Pulmonary;;  ? IR IMAGING GUIDED PORT INSERTION  12/30/2020  ? KNEE SURGERY Right   ? SHOULDER SURGERY Left   ? VIDEO BRONCHOSCOPY WITH ENDOBRONCHIAL ULTRASOUND N/A 02/08/2020  ? Procedure: VIDEO BRONCHOSCOPY WITH ENDOBRONCHIAL ULTRASOUND;  Surgeon: Freddi Starr, MD;  Location: [facility] ENDOSCOPY;  Service: Pulmonary;  Laterality: N/A;  ? ? ?REVIEW OF SYSTEMS:  A comprehensive review of systems was negative except for: Constitutional:  positive for anorexia, fatigue, and weight loss ?Gastrointestinal: positive for diarrhea  ? ?PHYSICAL EXAMINATION: General appearance: alert, cooperative, fatigued, and no distress ?Head: Normocephalic, without obvious abnormality, atraumatic ?Neck: no adenopathy, no JVD, supple, symmetrical, trachea midline, and thyroid not enlarged, symmetric, no tenderness/mass/nodules ?Lymph nodes: Cervical, supraclavicular, and axillary nodes normal. ?Resp:  clear to auscultation bilaterally ?Back: symmetric, no curvature. ROM normal. No CVA tenderness. ?Cardio: regular rate and rhythm, S1, S2 normal, no murmur, click, rub or gallop ?GI: soft, non-tender; bowel sounds normal; no masses,  no organomegaly ?Extremities: extremities normal, atraumatic, no cyanosis or edema ? ?ECOG PERFORMANCE STATUS: 1 - Symptomatic but completely ambulatory ? ?Blood pressure 122/90, pulse (!) 116, temperature (!) 96.9 ?F (36.1 ?C), temperature source Tympanic, resp. rate 18, height 5\' 9"  (1.753 m), weight 169 lb 1.6 oz (76.7 kg), SpO2 100 %. ? ?LABORATORY DATA: ?Lab Results  ?Component Value Date  ? WBC 4.3 03/15/2021  ? HGB 10.4 (L) 03/15/2021  ? HCT 32.4 (L) 03/15/2021  ? MCV 85.9 03/15/2021  ? PLT 283 03/15/2021  ? ? ?  Chemistry   ?   ?Component Value Date/Time  ? NA 140 03/08/2021 0938  ? K 3.7 03/08/2021 0938  ? CL 106 03/08/2021 0938  ? CO2 28 03/08/2021 0938  ? BUN 8 03/08/2021 0938  ? CREATININE 0.81 03/08/2021 0938  ?    ?Component Value Date/Time  ? CALCIUM 9.8 03/08/2021 0938  ? ALKPHOS 76 03/08/2021 0938  ? AST 12 (L) 03/08/2021 6144  ? ALT 10 03/08/2021 0938  ? BILITOT 0.5 03/08/2021 3154  ?  ? ? ? ?RADIOGRAPHIC STUDIES: ?No results found. ? ?ASSESSMENT AND PLAN: This is a very pleasant 66 years old African-American male recently diagnosed with limited stage small cell lung cancer in February 2022.  The patient is currently undergoing treatment according to the [place] clinical trial where the patient would be randomized to treatment with concurrent chemoradiation with cisplatin and/or etoposide versus concurrent chemoradiation with the same regimen in addition to atezolizumab. ?He is status post 3 cycles of the treatment.  His treatment was discontinued secondary to intolerance and the patient has been in observation since April 2022. ?He had MRI of the brain performed recently that showed no evidence of metastatic disease to the brain. ?Repeat CT scan of the chest,  abdomen and pelvis unfortunately showed evidence for disease progression involving new mass centered in the superior aspect of the right major fissure, widespread nodularity in the adjacent portion of the upper right lung, multiple new pleural-based lesions in the right hemithorax, new middle mediastinal lymphadenopathy, new hypervascular lesion in the proximal body of the pancreas and new lytic lesions in the parasymphyseal region of the left to pubic bone. ?His insurance declined a PET scan. ?The patient is started palliative systemic chemotherapy with carboplatin for AUC of 5 on day 1, etoposide 100 Mg/M2 on days 1, 2 and 3 with Cosela 4 Myeloprotection before chemotherapy and Imfinzi 1500 Mg IV every 3 weeks with the induction phase followed by maintenance Imfinzi every 4 weeks if the patient has no evidence for disease progression.  He is status post 4 cycles.  Starting from cycle #5 he will be on treatment with Imfinzi every 4 weeks. ?The patient is doing fine except for the few episodes of diarrhea. ?I recommended for him to proceed with cycle #5 today as planned. ?I will see him back for follow-up visit in 4 weeks for evaluation with repeat CT scan of the chest, abdomen  and pelvis for close monitoring of the suspicious progressive lesion seen on the last scan. ?For the dehydration and diarrhea, I will give the patient a liter of normal saline in the clinic today. ?The patient was advised to call immediately if he has any other concerning symptoms in the interval. ?The patient voices understanding of current disease status and treatment options and is in agreement with the current care plan. ? ?All questions were answered. The patient knows to call the clinic with any problems, questions or concerns. We can certainly see the patient much sooner if necessary. ? ?The total time spent in the appointment was 25 minutes. ? ?Disclaimer: This note was dictated with voice recognition software. Similar sounding words  can inadvertently be transcribed and may not be corrected upon review. ? ? ?  ?  ?

## 2021-04-07 ENCOUNTER — Ambulatory Visit
Admission: RE | Admit: 2021-04-07 | Discharge: 2021-04-07 | Disposition: A | Discharge status: Home / Self Care | Payer: Managed Care, Other (non HMO) | Source: Ambulatory Visit | Attending: Internal Medicine | Admitting: Internal Medicine

## 2021-04-07 DIAGNOSIS — C3490 Malignant neoplasm of unspecified part of unspecified bronchus or lung: Secondary | ICD-10-CM

## 2021-04-07 MED ORDER — IOPAMIDOL (ISOVUE-300) INJECTION 61%
100.0000 mL | Freq: Once | INTRAVENOUS | Status: AC | PRN
  Administered 2021-04-07: 100 mL via INTRAVENOUS

## 2021-04-11 NOTE — Progress Notes (Signed)
[facility] ?OFFICE PROGRESS NOTE ? ?[place], Blondell Reveal, MD ?[phone] ?[place] [address] ? ?DIAGNOSIS: Metastatic small cell lung cancer initially diagnosed as Limited stage (T1c, N2, M0) small cell lung cancer diagnosed in February 2022. ? ?PRIOR THERAPY: ?1)  Systemic chemotherapy according to phase 3 randomized clinical trial of chemoradiation versus chemoradiation plus atezolizumab status post 3 cycles.  This treatment was discontinued secondary to intolerance. ?2) palliative radiation to the left hip under the care of Dr. Tammi Klippel.  Last treatment on 01/03/2021 ?3) Second line systemic chemotherapy with carboplatin for AUC of 5 on day 1, etoposide 100 Mg/M2 on days 1, 2 and 3 as well as Imfinzi 1500 Mg IV on day 1 and Cosela 240 Mg/M2 before chemotherapy on days 1, 2 and 3 every 3 weeks.  First dose December 13, 2020.  Status post 5 cycles.  Starting from cycle #5, the patient started maintenance immunotherapy with Imfinzi 1500 mg IV every 4 weeks. ? ?CURRENT THERAPY: Zepleca 3.2 mg/m2 IV every 3 weeks. First dose starting next week on 04/19/21 ?  ? ?INTERVAL HISTORY: ?Adam [name] 66 y.o. male returns to the clinic today for a follow-up visit.  The patient is feeling fairly well today without any concerning complaints.  He is tolerating his treatment well without any concerning adverse side effects.  She denies any fever, chills, or night sweats.  His weight is fairly stable. He lost 1 lb since last being seen 4 weeks ago. He reports his breathing is "good".  He denies any chest pain, cough, shortness of breath, or hemoptysis. He mowed the lawn yesterday without any significant shortness of breath. Denies any nausea or vomiting or constipation. His diarrhea which he reported at his last appointment, resolved with imodium Denies any headache or visual changes.  Denies any rashes or skin changes.  The patient recently had a restaging CT scan performed.  He is here today for evaluation to  review his scan results before starting cycle #6. ? ?MEDICAL HISTORY: ?Past Medical History:  ?Diagnosis Date  ? Hypertension   ? Lung cancer ([place])   ? small cell RUL  ? ? ?ALLERGIES:  has No Known Allergies. ? ?MEDICATIONS:  ?Current Outpatient Medications  ?Medication Sig Dispense Refill  ? acetaminophen (TYLENOL) 325 MG tablet Take 650 mg by mouth every 6 (six) hours as needed.    ? amLODipine (NORVASC) 10 MG tablet Take 10 mg by mouth daily.    ? bisacodyl (DULCOLAX) 5 MG EC tablet Take 5 mg by mouth daily as needed for moderate constipation.    ? ibuprofen (ADVIL) 600 MG tablet Take 600 mg by mouth every 6 (six) hours as needed.    ? lidocaine-prilocaine (EMLA) cream Apply 1 application topically as needed. 30 g 2  ? lisinopril-hydrochlorothiazide (ZESTORETIC) 20-12.5 MG tablet Take 1 tablet by mouth daily. 90 tablet 1  ? nystatin (MYCOSTATIN) 100000 UNIT/ML suspension Take 5 mLs (500,000 Units total) by mouth 4 (four) times daily. 200 mL 0  ? ?No current facility-administered medications for this visit.  ? ? ?SURGICAL HISTORY:  ?Past Surgical History:  ?Procedure Laterality Date  ? BRONCHIAL BRUSHINGS  02/08/2020  ? Procedure: BRONCHIAL BRUSHINGS;  Surgeon: Freddi Starr, MD;  Location: [facility] ENDOSCOPY;  Service: Pulmonary;;  ? BRONCHIAL NEEDLE ASPIRATION BIOPSY  02/08/2020  ? Procedure: BRONCHIAL NEEDLE ASPIRATION BIOPSIES;  Surgeon: Freddi Starr, MD;  Location: [facility] ENDOSCOPY;  Service: Pulmonary;;  ? IR IMAGING GUIDED PORT INSERTION  12/30/2020  ?  KNEE SURGERY Right   ? SHOULDER SURGERY Left   ? VIDEO BRONCHOSCOPY WITH ENDOBRONCHIAL ULTRASOUND N/A 02/08/2020  ? Procedure: VIDEO BRONCHOSCOPY WITH ENDOBRONCHIAL ULTRASOUND;  Surgeon: Freddi Starr, MD;  Location: [facility] ENDOSCOPY;  Service: Pulmonary;  Laterality: N/A;  ? ? ?REVIEW OF SYSTEMS:   ?Review of Systems  ?Constitutional: Negative for appetite change, chills, fatigue, fever and unexpected weight change.  ?HENT: Negative for mouth sores,  nosebleeds, sore throat and trouble swallowing.   ?Eyes: Negative for eye problems and icterus.  ?Respiratory: Negative for cough, hemoptysis, shortness of breath and wheezing.   ?Cardiovascular: Negative for chest pain and leg swelling.  ?Gastrointestinal: Negative for abdominal pain, constipation, diarrhea, nausea and vomiting.  ?Genitourinary: Negative for bladder incontinence, difficulty urinating, dysuria, frequency and hematuria.   ?Musculoskeletal: Negative for back pain, gait problem, neck pain and neck stiffness.  ?Skin: Negative for itching and rash.  ?Neurological: Negative for dizziness, extremity weakness, gait problem, headaches, light-headedness and seizures.  ?Hematological: Negative for adenopathy. Does not bruise/bleed easily.  ?Psychiatric/Behavioral: Negative for confusion, depression and sleep disturbance. The patient is not nervous/anxious.   ? ? ?PHYSICAL EXAMINATION:  ?Blood pressure 125/82, pulse (!) 105, temperature 97.6 ?F (36.4 ?C), temperature source Axillary, resp. rate 18, height 5\' 9"  (1.753 m), weight 168 lb 9.6 oz (76.5 kg), SpO2 100 %. ? ?ECOG PERFORMANCE STATUS: 1 ? ?Physical Exam  ?Constitutional: Oriented to person, place, and time and cachetic appearing male and in no distress.  ?HENT:  ?Head: Normocephalic and atraumatic.  ?Mouth/Throat: Oropharynx is clear and moist. No oropharyngeal exudate.  ?Eyes: Conjunctivae are normal. Right eye exhibits no discharge. Left eye exhibits no discharge. No scleral icterus.  ?Neck: Normal range of motion. Neck supple.  ?Cardiovascular: Normal rate, regular rhythm, normal heart sounds and intact distal pulses.   ?Pulmonary/Chest: Effort normal and breath sounds normal. No respiratory distress. No wheezes. No rales.  ?Abdominal: Soft. Bowel sounds are normal. Exhibits no distension and no mass. There is no tenderness.  ?Musculoskeletal: Normal range of motion. Exhibits no edema.  ?Lymphadenopathy:  ?  No cervical adenopathy.  ?Neurological:  Alert and oriented to person, place, and time. Exhibits normal muscle tone. Gait normal. Coordination normal.  ?Skin: Skin is warm and dry. No rash noted. Not diaphoretic. No erythema. No pallor.  ?Psychiatric: Mood, memory and judgment normal.  ?Vitals reviewed. ? ?LABORATORY DATA: ?Lab Results  ?Component Value Date  ? WBC 4.5 04/12/2021  ? HGB 9.5 (L) 04/12/2021  ? HCT 29.5 (L) 04/12/2021  ? MCV 86.0 04/12/2021  ? PLT 272 04/12/2021  ? ? ?  Chemistry   ?   ?Component Value Date/Time  ? NA 138 04/12/2021 1014  ? K 3.8 04/12/2021 1014  ? CL 105 04/12/2021 1014  ? CO2 28 04/12/2021 1014  ? BUN 12 04/12/2021 1014  ? CREATININE 0.73 04/12/2021 1014  ?    ?Component Value Date/Time  ? CALCIUM 9.6 04/12/2021 1014  ? ALKPHOS 64 04/12/2021 1014  ? AST 15 04/12/2021 1014  ? ALT 10 04/12/2021 1014  ? BILITOT 0.3 04/12/2021 1014  ?  ? ? ? ?RADIOGRAPHIC STUDIES: ? ?CT Chest W Contrast ? ?Result Date: 04/08/2021 ?CLINICAL DATA:  Metastatic small-cell lung cancer restaging, assess treatment response * Tracking Code: BO * EXAM: CT CHEST, ABDOMEN, AND PELVIS WITH CONTRAST TECHNIQUE: Multidetector CT imaging of the chest, abdomen and pelvis was performed following the standard protocol during bolus administration of intravenous contrast. RADIATION DOSE REDUCTION: This exam was performed according to  the departmental dose-optimization program which includes automated exposure control, adjustment of the mA and/or kV according to patient size and/or use of iterative reconstruction technique. CONTRAST:  149mL ISOVUE-300 IOPAMIDOL (ISOVUE-300) INJECTION 61%, additional oral enteric contrast COMPARISON:  02/09/2021 FINDINGS: CT CHEST FINDINGS Cardiovascular: Right chest port catheter. Aortic atherosclerosis. Normal heart size. Three-vessel coronary artery calcifications. No pericardial effusion. Mediastinum/Nodes: Unchanged post treatment appearance of ill-defined soft tissue anterior to the trachea, measuring approximately 4.2 x 1.9 cm  (series 2, image 22). Interval enlargement of a low left hilar or periaortic bulky lymph node or soft tissue mass, lying posterior to the left mainstem bronchus and overlying the aorta, measuring 5.0 x 3.5 cm, p

## 2021-04-12 ENCOUNTER — Inpatient Hospital Stay: Payer: Commercial Managed Care - HMO

## 2021-04-12 ENCOUNTER — Inpatient Hospital Stay (HOSPITAL_BASED_OUTPATIENT_CLINIC_OR_DEPARTMENT_OTHER): Payer: Commercial Managed Care - HMO | Admitting: Physician Assistant

## 2021-04-12 ENCOUNTER — Other Ambulatory Visit: Payer: Self-pay

## 2021-04-12 ENCOUNTER — Inpatient Hospital Stay: Payer: Commercial Managed Care - HMO | Attending: Hematology and Oncology

## 2021-04-12 ENCOUNTER — Other Ambulatory Visit: Payer: Self-pay | Admitting: Internal Medicine

## 2021-04-12 ENCOUNTER — Inpatient Hospital Stay: Payer: Commercial Managed Care - HMO | Admitting: Dietician

## 2021-04-12 VITALS — BP 125/82 | HR 105 | Temp 97.6°F | Resp 18 | Ht 69.0 in | Wt 168.6 lb

## 2021-04-12 DIAGNOSIS — Z79899 Other long term (current) drug therapy: Secondary | ICD-10-CM | POA: Diagnosis not present

## 2021-04-12 DIAGNOSIS — Z7189 Other specified counseling: Secondary | ICD-10-CM

## 2021-04-12 DIAGNOSIS — J439 Emphysema, unspecified: Secondary | ICD-10-CM | POA: Diagnosis not present

## 2021-04-12 DIAGNOSIS — Z5111 Encounter for antineoplastic chemotherapy: Secondary | ICD-10-CM | POA: Diagnosis not present

## 2021-04-12 DIAGNOSIS — C3491 Malignant neoplasm of unspecified part of right bronchus or lung: Secondary | ICD-10-CM | POA: Diagnosis not present

## 2021-04-12 DIAGNOSIS — I700 Atherosclerosis of aorta: Secondary | ICD-10-CM | POA: Diagnosis not present

## 2021-04-12 DIAGNOSIS — C3411 Malignant neoplasm of upper lobe, right bronchus or lung: Secondary | ICD-10-CM

## 2021-04-12 DIAGNOSIS — Z95828 Presence of other vascular implants and grafts: Secondary | ICD-10-CM

## 2021-04-12 LAB — CBC WITH DIFFERENTIAL (CANCER CENTER ONLY)
Abs Immature Granulocytes: 0.02 10*3/uL (ref 0.00–0.07)
Basophils Absolute: 0 10*3/uL (ref 0.0–0.1)
Basophils Relative: 0 %
Eosinophils Absolute: 0.1 10*3/uL (ref 0.0–0.5)
Eosinophils Relative: 3 %
HCT: 29.5 % — ABNORMAL LOW (ref 39.0–52.0)
Hemoglobin: 9.5 g/dL — ABNORMAL LOW (ref 13.0–17.0)
Immature Granulocytes: 0 %
Lymphocytes Relative: 13 %
Lymphs Abs: 0.6 10*3/uL — ABNORMAL LOW (ref 0.7–4.0)
MCH: 27.7 pg (ref 26.0–34.0)
MCHC: 32.2 g/dL (ref 30.0–36.0)
MCV: 86 fL (ref 80.0–100.0)
Monocytes Absolute: 0.6 10*3/uL (ref 0.1–1.0)
Monocytes Relative: 12 %
Neutro Abs: 3.2 10*3/uL (ref 1.7–7.7)
Neutrophils Relative %: 72 %
Platelet Count: 272 10*3/uL (ref 150–400)
RBC: 3.43 MIL/uL — ABNORMAL LOW (ref 4.22–5.81)
RDW: 18.9 % — ABNORMAL HIGH (ref 11.5–15.5)
WBC Count: 4.5 10*3/uL (ref 4.0–10.5)
nRBC: 0 % (ref 0.0–0.2)

## 2021-04-12 LAB — CMP (CANCER CENTER ONLY)
ALT: 10 U/L (ref 0–44)
AST: 15 U/L (ref 15–41)
Albumin: 3.5 g/dL (ref 3.5–5.0)
Alkaline Phosphatase: 64 U/L (ref 38–126)
Anion gap: 5 (ref 5–15)
BUN: 12 mg/dL (ref 8–23)
CO2: 28 mmol/L (ref 22–32)
Calcium: 9.6 mg/dL (ref 8.9–10.3)
Chloride: 105 mmol/L (ref 98–111)
Creatinine: 0.73 mg/dL (ref 0.61–1.24)
GFR, Estimated: >60 mL/min (ref >60)
Glucose, Bld: 99 mg/dL (ref 70–99)
Potassium: 3.8 mmol/L (ref 3.5–5.1)
Sodium: 138 mmol/L (ref 135–145)
Total Bilirubin: 0.3 mg/dL (ref 0.3–1.2)
Total Protein: 7.4 g/dL (ref 6.5–8.1)

## 2021-04-12 LAB — TSH: TSH: 0.414 u[IU]/mL (ref 0.320–4.118)

## 2021-04-12 MED ORDER — SODIUM CHLORIDE 0.9% FLUSH
10.0000 mL | INTRAVENOUS | Status: AC | PRN
  Administered 2021-04-12: 10 mL

## 2021-04-12 MED ORDER — HEPARIN SOD (PORK) LOCK FLUSH 100 UNIT/ML IV SOLN
500.0000 [IU] | Freq: Once | INTRAVENOUS | Status: AC
  Administered 2021-04-12: 500 [IU] via INTRAVENOUS

## 2021-04-12 MED ORDER — SODIUM CHLORIDE 0.9% FLUSH
10.0000 mL | Freq: Once | INTRAVENOUS | Status: AC
  Administered 2021-04-12: 10 mL via INTRAVENOUS

## 2021-04-12 NOTE — Progress Notes (Signed)
DISCONTINUE ON PATHWAY REGIMEN - Small Cell Lung ? ? ?  Cycles 1 through 4: A cycle is every 21 days: ?    Durvalumab  ?    Carboplatin  ?    Etoposide  ?  Cycles 5 and beyond: A cycle is every 28 days: ?    Durvalumab  ? ?**Always confirm dose/schedule in your pharmacy ordering system** ? ?REASON: Disease Progression ?PRIOR TREATMENT: LOS420: Durvalumab 1,500 mg D1 + Carboplatin AUC=5 D1 + Etoposide 100 mg/m2 D1-3 q21 Days x 4 Cycles, Followed by Durvalumab 1,500 mg q28 Days Until Progression or Unacceptable Toxicity ?TREATMENT RESPONSE: Stable Disease (SD) ? ?START OFF PATHWAY REGIMEN - Small Cell Lung ? ? ?[id number]:Lurbinectedin 3.2 mg/m2 IV D1 q21 Days: ?  A cycle is every 21 days: ?    Lurbinectedin  ? ?**Always confirm dose/schedule in your pharmacy ordering system** ? ?Patient Characteristics: ?Relapsed or Progressive Disease, Third Line and Beyond ?Therapeutic Status: Relapsed or Progressive Disease ?Line of Therapy: Third Line and Beyond ? ?Intent of Therapy: ?Non-Curative / Palliative Intent, Discussed with Patient ?

## 2021-04-12 NOTE — Progress Notes (Signed)
Nutrition Follow-up: ? ?Patient with metastatic small cell lung cancer. Now with progression. Pending start of zepleca q3w (first treatment planned 4/12) ? ?Met briefly with patient in infusion room during port deaccess. He reports appetite has improved and is eating better. Patient endorses not feeling well a couple weeks ago, reports ongoing diarrhea lasting one week. This finally resolved after imodium. Patient denies nausea, vomiting, diarrhea, constipation.  ? ? ?Medications: reviewed  ? ?Labs: reviewed  ? ?Anthropometrics: Weight 168 lb 9.6 oz today stable x 1 month ? ?3/8 - 169 lb 1.6 ?2/15 - 173 lb 9.6 oz  ?1/25 - 170 lb 3.2 oz  ? ?NUTRITION DIAGNOSIS: Inadequate oral intake improved  ? ? ?INTERVENTION:  ?Encouraged high calorie, high protein foods for weight maintenance ?Patient declined need for oral supplement assistance today  ?  ? ?MONITORING, EVALUATION, GOAL: weight trends, intake  ? ? ?NEXT VISIT: To be scheduled with updated treatment plan  ? ? ? ?

## 2021-04-14 ENCOUNTER — Telehealth: Payer: Self-pay | Admitting: Internal Medicine

## 2021-04-14 NOTE — Telephone Encounter (Signed)
Scheduled per 04/05 los, patient has been called and notified. ?

## 2021-04-19 ENCOUNTER — Other Ambulatory Visit: Payer: Self-pay

## 2021-04-19 ENCOUNTER — Inpatient Hospital Stay: Payer: Commercial Managed Care - HMO

## 2021-04-19 VITALS — BP 119/82 | HR 106 | Temp 97.8°F | Resp 18 | Wt 169.5 lb

## 2021-04-19 DIAGNOSIS — Z95828 Presence of other vascular implants and grafts: Secondary | ICD-10-CM

## 2021-04-19 DIAGNOSIS — C3411 Malignant neoplasm of upper lobe, right bronchus or lung: Secondary | ICD-10-CM

## 2021-04-19 LAB — CMP (CANCER CENTER ONLY)
ALT: 10 U/L (ref 0–44)
AST: 13 U/L — ABNORMAL LOW (ref 15–41)
Albumin: 3.4 g/dL — ABNORMAL LOW (ref 3.5–5.0)
Alkaline Phosphatase: 62 U/L (ref 38–126)
Anion gap: 7 (ref 5–15)
BUN: 10 mg/dL (ref 8–23)
CO2: 28 mmol/L (ref 22–32)
Calcium: 9.8 mg/dL (ref 8.9–10.3)
Chloride: 102 mmol/L (ref 98–111)
Creatinine: 0.75 mg/dL (ref 0.61–1.24)
GFR, Estimated: >60 mL/min (ref >60)
Glucose, Bld: 96 mg/dL (ref 70–99)
Potassium: 3.7 mmol/L (ref 3.5–5.1)
Sodium: 137 mmol/L (ref 135–145)
Total Bilirubin: 0.4 mg/dL (ref 0.3–1.2)
Total Protein: 7.8 g/dL (ref 6.5–8.1)

## 2021-04-19 LAB — CBC WITH DIFFERENTIAL (CANCER CENTER ONLY)
Abs Immature Granulocytes: 0.01 10*3/uL (ref 0.00–0.07)
Basophils Absolute: 0 10*3/uL (ref 0.0–0.1)
Basophils Relative: 1 %
Eosinophils Absolute: 0.2 10*3/uL (ref 0.0–0.5)
Eosinophils Relative: 4 %
HCT: 30.5 % — ABNORMAL LOW (ref 39.0–52.0)
Hemoglobin: 9.8 g/dL — ABNORMAL LOW (ref 13.0–17.0)
Immature Granulocytes: 0 %
Lymphocytes Relative: 15 %
Lymphs Abs: 0.6 10*3/uL — ABNORMAL LOW (ref 0.7–4.0)
MCH: 27.3 pg (ref 26.0–34.0)
MCHC: 32.1 g/dL (ref 30.0–36.0)
MCV: 85 fL (ref 80.0–100.0)
Monocytes Absolute: 0.6 10*3/uL (ref 0.1–1.0)
Monocytes Relative: 15 %
Neutro Abs: 2.6 10*3/uL (ref 1.7–7.7)
Neutrophils Relative %: 65 %
Platelet Count: 234 10*3/uL (ref 150–400)
RBC: 3.59 MIL/uL — ABNORMAL LOW (ref 4.22–5.81)
RDW: 17.3 % — ABNORMAL HIGH (ref 11.5–15.5)
WBC Count: 4 10*3/uL (ref 4.0–10.5)
nRBC: 0 % (ref 0.0–0.2)

## 2021-04-19 MED ORDER — SODIUM CHLORIDE 0.9 % IV SOLN
10.0000 mg | Freq: Once | INTRAVENOUS | Status: AC
  Administered 2021-04-19: 10 mg via INTRAVENOUS
  Filled 2021-04-19: qty 10

## 2021-04-19 MED ORDER — SODIUM CHLORIDE 0.9 % IV SOLN: Freq: Once | INTRAVENOUS | Status: AC

## 2021-04-19 MED ORDER — SODIUM CHLORIDE 0.9% FLUSH
10.0000 mL | INTRAVENOUS | Status: AC | PRN
  Administered 2021-04-19: 10 mL

## 2021-04-19 MED ORDER — SODIUM CHLORIDE 0.9% FLUSH
10.0000 mL | INTRAVENOUS | Status: DC | PRN
  Administered 2021-04-19: 10 mL

## 2021-04-19 MED ORDER — PALONOSETRON HCL INJECTION 0.25 MG/5ML
0.2500 mg | Freq: Once | INTRAVENOUS | Status: AC
  Administered 2021-04-19: 0.25 mg via INTRAVENOUS
  Filled 2021-04-19: qty 5

## 2021-04-19 MED ORDER — HEPARIN SOD (PORK) LOCK FLUSH 100 UNIT/ML IV SOLN
500.0000 [IU] | Freq: Once | INTRAVENOUS | Status: AC | PRN
  Administered 2021-04-19: 500 [IU]

## 2021-04-19 MED ORDER — SODIUM CHLORIDE 0.9 % IV SOLN
3.2000 mg/m2 | Freq: Once | INTRAVENOUS | Status: AC
  Administered 2021-04-19: 6.2 mg via INTRAVENOUS
  Filled 2021-04-19: qty 12.4

## 2021-04-19 NOTE — Patient Instructions (Signed)
[facility]  Discharge Instructions: ?Thank you for choosing [facility] to provide your oncology and hematology care.  ? ?If you have a lab appointment with the [facility], please go directly to the [facility] and check in at the registration area. ?  ?Wear comfortable clothing and clothing appropriate for easy access to any Portacath or PICC line.  ? ?We strive to give you quality time with your provider. You may need to reschedule your appointment if you arrive late (15 or more minutes).  Arriving late affects you and other patients whose appointments are after yours.  Also, if you miss three or more appointments without notifying the office, you may be dismissed from the clinic at the provider?s discretion.    ?  ?For prescription refill requests, have your pharmacy contact our office and allow 72 hours for refills to be completed.   ? ?Today you received the following chemotherapy and/or immunotherapy agents: Lurbinectedin (Zepzelca)    ?  ?To help prevent nausea and vomiting after your treatment, we encourage you to take your nausea medication as directed. ? ?BELOW ARE SYMPTOMS THAT SHOULD BE REPORTED IMMEDIATELY: ?*FEVER GREATER THAN 100.4 F (38 ?C) OR HIGHER ?*CHILLS OR SWEATING ?*NAUSEA AND VOMITING THAT IS NOT CONTROLLED WITH YOUR NAUSEA MEDICATION ?*UNUSUAL SHORTNESS OF BREATH ?*UNUSUAL BRUISING OR BLEEDING ?*URINARY PROBLEMS (pain or burning when urinating, or frequent urination) ?*BOWEL PROBLEMS (unusual diarrhea, constipation, pain near the anus) ?TENDERNESS IN MOUTH AND THROAT WITH OR WITHOUT PRESENCE OF ULCERS (sore throat, sores in mouth, or a toothache) ?UNUSUAL RASH, SWELLING OR PAIN  ?UNUSUAL VAGINAL DISCHARGE OR ITCHING  ? ?Items with * indicate a potential emergency and should be followed up as soon as possible or go to the Emergency Department if any problems should occur. ? ?Please show the CHEMOTHERAPY ALERT CARD or IMMUNOTHERAPY ALERT CARD  at check-in to the Emergency Department and triage nurse. ? ?Should you have questions after your visit or need to cancel or reschedule your appointment, please contact [facility]  Dept: [phone]  and follow the prompts.  Office hours are 8:00 a.m. to 4:30 p.m. Monday - Friday. Please note that voicemails left after 4:00 p.m. may not be returned until the following business day.  We are closed weekends and major holidays. You have access to a nurse at all times for urgent questions. Please call the main number to the clinic Dept: [phone] and follow the prompts. ? ? ?For any non-urgent questions, you may also contact your provider using MyChart. We now offer e-Visits for anyone 56 and older to request care online for non-urgent symptoms. For details visit [email or web address]. ?  ?Also download the MyChart app! Go to the app store, search "MyChart", open the app, select [facility], and log in with your MyChart username and password. ? ?Due to Covid, a mask is required upon entering the hospital/clinic. If you do not have a mask, one will be given to you upon arrival. For doctor visits, patients may have 1 support Cade Olberding aged 58 or older with them. For treatment visits, patients cannot have anyone with them due to current Covid guidelines and our immunocompromised population.  ?  ?     Lurbinectedin Injection ?What is this medication? ?LURBINECTEDIN (LOOR bin EK te din) treats lung cancer. It works by slowing down the growth of cancer cells. ?This medicine may be used for other purposes; ask your health care provider or pharmacist if you  have questions. ?COMMON BRAND NAME(S): ZEPZELCA ?What should I tell my care team before I take this medication? ?They need to know if you have any of these conditions: ?Liver disease ?Low blood counts--white cells, platelets, red blood cells ?An unusual or allergic reaction to lurbinectedin, other medicines, foods, dyes or  preservatives ?Pregnant or trying to get pregnant ?Beast-feeding ?How should I use this medication? ?This medication is injected into a vein. It is given in a hospital or clinic setting. ?Talk to your care team about the use of this medicine in children. Special care may be needed. ?Overdosage: If you think you have taken too much of this medicine contact a poison control center or emergency room at once. ?NOTE: This medicine is only for you. Do not share this medicine with others. ?What if I miss a dose? ?Keep appointments for follow-up doses. It is important not to miss your dose. Call your care team if you are unable to keep an appointment. ?What may interact with this medication? ?Certain antibiotics like erythromycin or clarithromycin ?Certain antivirals for HIV or hepatitis ?Certain medicines for fungal infections like ketoconazole, itraconazole, posaconazole ?Certain medicines for seizures like carbamazepine, phenobarbital, phenytoin ?Grapefruit or grapefruit juice ?[place] ?This list may not describe all possible interactions. Give your health care provider a list of all the medicines, herbs, non-prescription drugs, or dietary supplements you use. Also tell them if you smoke, drink alcohol, or use illegal drugs. Some items may interact with your medicine. ?What should I watch for while using this medication? ?This medication may make you feel generally unwell. This is not uncommon as chemotherapy can affect healthy cells as well as cancer cells. Report any side effects. Continue your course of treatment even though you feel ill unless your care team tells you to stop. ?This medication may increase your risk of getting an infection. Call your care team for advice if you get a fever, chills, sore throat, or other symptoms of a cold or flu. Do not treat yourself. Try to avoid being around people who are sick. ?Avoid taking medications that contain aspirin, acetaminophen, ibuprofen, naproxen, or  ketoprofen unless instructed by your care team. These medications may hide a fever. ?Be careful brushing or flossing your teeth or using a toothpick because you may get an infection or bleed more easily. If you have any dental work done, tell your dentist you are receiving this medication. ?Do not become pregnant while taking this medication or for 6 months after stopping it. Women should inform their care team if they wish to become pregnant or think they might be pregnant. Men should not father a child while taking this medication and for 4 months after stopping it. There is potential for serious side effects to an unborn child. Talk to your care team for more information. Do not breast-feed a child while taking this medication or for 2 weeks after stopping it. ?What side effects may I notice from receiving this medication? ?Side effects that you should report to your care team as soon as possible: ?Allergic reactions--skin rash, itching, hives, swelling of the face, lips, tongue, or throat ?Infection--fever, chills, cough, or sore throat ?Kidney injury--decrease in the amount of urine, swelling of the ankles, hands, or feet ?Leakage of medicine out of your vein during the infusion ?Liver injury--right upper belly pain, loss of appetite, nausea, light-colored stool, dark yellow or brown urine, yellowing skin or eyes, unusual weakness or fatigue ?Low red blood cell count--unusual weakness or fatigue, dizziness, headache,  trouble breathing ?Muscle injury--unusual weakness or fatigue, muscle pain, dark yellow or brown urine, decrease in amount of urine ?Painful swelling, warmth, or redness of the skin, blisters, or sores ?Unusual bruising or bleeding ?Vomiting ?Side effects that usually do not require medical attention (report these to your care team if they continue or are bothersome): ?Constipation ?Cough ?Diarrhea ?High blood sugar (hyperglycemia)--increased thirst or amount of urine, unusual weakness or fatigue,  blurry vision ?Loss of appetite ?Low magnesium level--muscle pain or cramps, unusual weakness or fatigue, fast or irregular heartbeat, tremors ?Low sodium level--muscle weakness, fatigue, dizziness, headache, confusion ?Nausea

## 2021-04-19 NOTE — Progress Notes (Signed)
Per Dr Julien Nordmann, okay to treat today with heart rate 106.  ?

## 2021-04-20 ENCOUNTER — Other Ambulatory Visit (HOSPITAL_COMMUNITY): Payer: Self-pay | Admitting: Interventional Radiology

## 2021-05-09 ENCOUNTER — Encounter: Payer: Self-pay | Admitting: Internal Medicine

## 2021-05-09 DIAGNOSIS — Z95828 Presence of other vascular implants and grafts: Secondary | ICD-10-CM | POA: Insufficient documentation

## 2021-05-10 ENCOUNTER — Inpatient Hospital Stay: Payer: Commercial Managed Care - HMO | Admitting: Emergency Medicine

## 2021-05-10 ENCOUNTER — Inpatient Hospital Stay: Payer: Commercial Managed Care - HMO | Attending: Hematology and Oncology | Admitting: Internal Medicine

## 2021-05-10 ENCOUNTER — Other Ambulatory Visit: Payer: Self-pay

## 2021-05-10 ENCOUNTER — Inpatient Hospital Stay: Payer: Commercial Managed Care - HMO | Admitting: Dietician

## 2021-05-10 ENCOUNTER — Encounter: Payer: Self-pay | Admitting: Internal Medicine

## 2021-05-10 ENCOUNTER — Ambulatory Visit: Payer: Managed Care, Other (non HMO) | Admitting: Internal Medicine

## 2021-05-10 ENCOUNTER — Inpatient Hospital Stay: Payer: Commercial Managed Care - HMO

## 2021-05-10 ENCOUNTER — Other Ambulatory Visit: Payer: Self-pay | Admitting: Internal Medicine

## 2021-05-10 ENCOUNTER — Other Ambulatory Visit: Payer: Managed Care, Other (non HMO)

## 2021-05-10 ENCOUNTER — Ambulatory Visit: Payer: Managed Care, Other (non HMO)

## 2021-05-10 VITALS — BP 112/71 | HR 115 | Temp 96.8°F | Resp 17 | Wt 162.1 lb

## 2021-05-10 VITALS — BP 97/68 | HR 98 | Temp 97.7°F | Resp 18

## 2021-05-10 DIAGNOSIS — D6481 Anemia due to antineoplastic chemotherapy: Secondary | ICD-10-CM | POA: Diagnosis not present

## 2021-05-10 DIAGNOSIS — Z95828 Presence of other vascular implants and grafts: Secondary | ICD-10-CM

## 2021-05-10 DIAGNOSIS — W19XXXA Unspecified fall, initial encounter: Secondary | ICD-10-CM | POA: Insufficient documentation

## 2021-05-10 DIAGNOSIS — C7951 Secondary malignant neoplasm of bone: Secondary | ICD-10-CM

## 2021-05-10 DIAGNOSIS — C3491 Malignant neoplasm of unspecified part of right bronchus or lung: Secondary | ICD-10-CM

## 2021-05-10 DIAGNOSIS — I10 Essential (primary) hypertension: Secondary | ICD-10-CM | POA: Diagnosis not present

## 2021-05-10 DIAGNOSIS — L0390 Cellulitis, unspecified: Secondary | ICD-10-CM | POA: Diagnosis not present

## 2021-05-10 DIAGNOSIS — Z5111 Encounter for antineoplastic chemotherapy: Secondary | ICD-10-CM | POA: Diagnosis not present

## 2021-05-10 DIAGNOSIS — C3411 Malignant neoplasm of upper lobe, right bronchus or lung: Secondary | ICD-10-CM

## 2021-05-10 DIAGNOSIS — Z87891 Personal history of nicotine dependence: Secondary | ICD-10-CM | POA: Diagnosis not present

## 2021-05-10 DIAGNOSIS — Z79899 Other long term (current) drug therapy: Secondary | ICD-10-CM | POA: Insufficient documentation

## 2021-05-10 LAB — CMP (CANCER CENTER ONLY)
ALT: 21 U/L (ref 0–44)
AST: 20 U/L (ref 15–41)
Albumin: 2.5 g/dL — ABNORMAL LOW (ref 3.5–5.0)
Alkaline Phosphatase: 64 U/L (ref 38–126)
Anion gap: 8 (ref 5–15)
BUN: 16 mg/dL (ref 8–23)
CO2: 29 mmol/L (ref 22–32)
Calcium: 9.2 mg/dL (ref 8.9–10.3)
Chloride: 100 mmol/L (ref 98–111)
Creatinine: 0.89 mg/dL (ref 0.61–1.24)
GFR, Estimated: >60 mL/min (ref >60)
Glucose, Bld: 113 mg/dL — ABNORMAL HIGH (ref 70–99)
Potassium: 2.8 mmol/L — ABNORMAL LOW (ref 3.5–5.1)
Sodium: 137 mmol/L (ref 135–145)
Total Bilirubin: 0.6 mg/dL (ref 0.3–1.2)
Total Protein: 7.6 g/dL (ref 6.5–8.1)

## 2021-05-10 LAB — CBC WITH DIFFERENTIAL (CANCER CENTER ONLY)
Abs Immature Granulocytes: 0.04 10*3/uL (ref 0.00–0.07)
Basophils Absolute: 0 10*3/uL (ref 0.0–0.1)
Basophils Relative: 0 %
Eosinophils Absolute: 0 10*3/uL (ref 0.0–0.5)
Eosinophils Relative: 0 %
HCT: 24 % — ABNORMAL LOW (ref 39.0–52.0)
Hemoglobin: 7.8 g/dL — ABNORMAL LOW (ref 13.0–17.0)
Immature Granulocytes: 1 %
Lymphocytes Relative: 9 %
Lymphs Abs: 0.6 10*3/uL — ABNORMAL LOW (ref 0.7–4.0)
MCH: 27 pg (ref 26.0–34.0)
MCHC: 32.5 g/dL (ref 30.0–36.0)
MCV: 83 fL (ref 80.0–100.0)
Monocytes Absolute: 0.6 10*3/uL (ref 0.1–1.0)
Monocytes Relative: 10 %
Neutro Abs: 5.1 10*3/uL (ref 1.7–7.7)
Neutrophils Relative %: 80 %
Platelet Count: 210 10*3/uL (ref 150–400)
RBC: 2.89 MIL/uL — ABNORMAL LOW (ref 4.22–5.81)
RDW: 16.9 % — ABNORMAL HIGH (ref 11.5–15.5)
WBC Count: 6.4 10*3/uL (ref 4.0–10.5)
nRBC: 0 % (ref 0.0–0.2)

## 2021-05-10 LAB — ABO/RH: ABO/RH(D): O POS

## 2021-05-10 LAB — PREPARE RBC (CROSSMATCH)

## 2021-05-10 MED ORDER — SODIUM CHLORIDE 0.9 % IV SOLN
10.0000 mg | Freq: Once | INTRAVENOUS | Status: AC
  Administered 2021-05-10: 10 mg via INTRAVENOUS
  Filled 2021-05-10: qty 10

## 2021-05-10 MED ORDER — DIPHENHYDRAMINE HCL 50 MG/ML IJ SOLN
25.0000 mg | Freq: Once | INTRAMUSCULAR | Status: AC
  Administered 2021-05-10: 25 mg via INTRAVENOUS
  Filled 2021-05-10: qty 1

## 2021-05-10 MED ORDER — ACETAMINOPHEN 325 MG PO TABS
650.0000 mg | ORAL_TABLET | Freq: Once | ORAL | Status: AC
  Administered 2021-05-10: 650 mg via ORAL
  Filled 2021-05-10: qty 2

## 2021-05-10 MED ORDER — HEPARIN SOD (PORK) LOCK FLUSH 100 UNIT/ML IV SOLN
500.0000 [IU] | Freq: Once | INTRAVENOUS | Status: AC | PRN
  Administered 2021-05-10: 500 [IU]

## 2021-05-10 MED ORDER — SODIUM CHLORIDE 0.9 % IV SOLN
3.2000 mg/m2 | Freq: Once | INTRAVENOUS | Status: AC
  Administered 2021-05-10: 6.2 mg via INTRAVENOUS
  Filled 2021-05-10: qty 12.4

## 2021-05-10 MED ORDER — SODIUM CHLORIDE 0.9 % IV SOLN: Freq: Once | INTRAVENOUS | Status: AC

## 2021-05-10 MED ORDER — SODIUM CHLORIDE 0.9% FLUSH: 3.0000 mL | INTRAVENOUS | Status: DC | PRN

## 2021-05-10 MED ORDER — SODIUM CHLORIDE 0.9% IV SOLUTION
250.0000 mL | Freq: Once | INTRAVENOUS | Status: AC
  Administered 2021-05-10: 250 mL via INTRAVENOUS

## 2021-05-10 MED ORDER — METHYLPREDNISOLONE 4 MG PO TBPK: ORAL_TABLET | ORAL | 0 refills | Status: DC

## 2021-05-10 MED ORDER — POTASSIUM CHLORIDE 10 MEQ/100ML IV SOLN
10.0000 meq | INTRAVENOUS | Status: AC
  Administered 2021-05-10 (×2): 10 meq via INTRAVENOUS
  Filled 2021-05-10 (×2): qty 100

## 2021-05-10 MED ORDER — PALONOSETRON HCL INJECTION 0.25 MG/5ML
0.2500 mg | Freq: Once | INTRAVENOUS | Status: AC
  Administered 2021-05-10: 0.25 mg via INTRAVENOUS
  Filled 2021-05-10: qty 5

## 2021-05-10 MED ORDER — MIRTAZAPINE 15 MG PO TABS: 30.0000 mg | ORAL_TABLET | Freq: Every day | ORAL | 2 refills | Status: DC

## 2021-05-10 MED ORDER — SODIUM CHLORIDE 0.9% FLUSH
10.0000 mL | Freq: Once | INTRAVENOUS | Status: AC
  Administered 2021-05-10: 10 mL

## 2021-05-10 MED ORDER — SODIUM CHLORIDE 0.9% FLUSH
10.0000 mL | INTRAVENOUS | Status: DC | PRN
  Administered 2021-05-10: 10 mL

## 2021-05-10 MED ORDER — POTASSIUM CHLORIDE CRYS ER 20 MEQ PO TBCR: 20.0000 meq | EXTENDED_RELEASE_TABLET | Freq: Two times a day (BID) | ORAL | 0 refills | Status: DC

## 2021-05-10 NOTE — Research (Signed)
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 ?12 MONTH POST RT VISIT ?  ?Patient arrives today Unaccompanied for the 12 month post RT visit.  ?  ?PROs: ?Per study protocol no PROs or QOL assessments due at this time.   ?  ?LABS: ?Used SOC labs drawn today for assessment (CMP, TSH, and CBC).  Patient Adam [name] tolerated well without complaint.  Results discussed below in AE section. ?  ?MEDICATION REVIEW: ?Patient reviews and verifies the current medication list is correct.  Reported changes were recorded on the medication list and marked as reviewed. All other listed medications remain the same. ?MD Mohamed prescribed Remeron and course of steroids (medrol dosepak) today for low energy and decreased appetite, pt to start taking them this evening.  Potassium supplements (40 mg tablets) for home administration also ordered today, pt to start taking them this evening. ? ?VITAL SIGNS: ?Vital signs are collected per study protocol during MD [facility] appt today. ?BP 112/71 ?HR 115 bpm ?Resp 17/min ?Temp 96.8 F (skin) ?O2 100% on room air ?Wt 162 lb 2 oz ?Discussed further below in AE section. ? ?MD/PROVIDER VISIT: ?Patient sees MD [facility] for today's visit.  See MD note for more details. ?  ?ADVERSE EVENTS: USING CTCAE V. 5.0 ?Patient has a baseline history of hypertension managed with medication, value today is below Grade 1 critiera. BP values between last Research visit on 02/22/21 and now all remained Grade 1. ?Patient also has a baseline history of anemia, Hgb value today is 7.8 (Grade 3) requiring a blood transfusion.  Hgb values between last Research visit on 02/22/21 and now have been between Grade 1-2 and have not required any intervention/transfusion until today. ?Tachycardia present today (Grade 1) without symptoms per patient, no intervention, unrelated to study per MD, no action/reporting required.   See AE table for more details regarding vitals between this visit and last Research visit (02/22/21). ?Left hip pain/arthralgia (Grade 1) ongoing, no intervention, remains unrelated to study per MD, no action or reporting required. ?Patient reports single fall one week ago (Grade 1) without injury or intervention, denies LOC, states he tripped and fell on left side without hitting his head.  Per MD unrelated to study, no action or reporting required. ?Loss of appetite/anorexia (Grade 2) requiring nutritional support orally reported by patient starting about two weeks ago.  MD prescribed Remeron. Per MD unrelated to study, no action or reporting required. ?Weight loss (Grade 2) requiring nutritional support (supplements and consulting with dietician), unrelated to study per MD, no action or reporting required. ?Fatigue (Grade 2) unrelieved with rest reported starting about 2 weeks ago, Remeron and steroids prescribed by MD.  Unrelated per MD to study, no action or reporting required. ?Depression (Grade 2) with moderate symptoms reported today, Remeron prescribed by MD.  Unrelated per MD to study, no action or reporting required. ?Labs drawn today (05/10/21) showed the following: ?Hyperglycemia (Grade 1), no intervention, unrelated to study per MD, no action/reporting required.  See AE table for more details regarding labs drawn between this visit and last Research visit (02/22/21). ?Hypokalemia (Grade 2) without symptoms per pt but requiring intervention, IV K+ replacement today and oral K+ ti take at home starting today, unrelated to study per MD, no action/reporting required ?Hypoalbuminemia (Grade 2), no intervention, unrelated to study per MD, no action/reporting required.  See AE table for more details regarding labs drawn between this visit and last  Research visit (02/22/21). ?One incidence of decreased platelet count occurred on 03/08/21, no intervention, resolved by 03/15/21.  Unrelated to study per MD, no  action/reporting required. ?All other abnormal labs drawn today (as well as during period between this visit and last Research visit on 02/22/21) have been ruled clinically irrelevant per MD.  No action or reporting required. ? ?SOLICITED ADVERSE EVENTS: ? Patient denies any of the protocol required solicited adverse events: pneumonitis, pericarditis, esophagitis, and encephalitis.  No action or reporting at this time required. ? ?ADVERSE EVENT LOG:  ?Adam [name] ?[id number] ?#[id number] ? ?05/10/2021 ? ?Adverse Event Log ? ?Study/Protocol: NRG-LU005 ?Cycle: 12 month post RT ? ?Event Grade Onset Date Resolved Date Drug Name Attribution Treatment Comments  ?Hyperglycemia Grade 1 02/22/21 03/01/2021 ?Resolved None Unrelated None   ?Hyperglycemia Grade 1 03/15/21 04/12/21 ?Resolved None Unrelated None   ?Hyperglycemia Grade 1 05/10/21 Ongoing None Unrelated None   ?White blood cell decrease Grade 1 11/22/20 03/15/21 ?Resolved None Unrelated None   ?Neutrophil count decrease Grade 1 11/22/20 03/15/21 ?Resolved None Unrelated None   ?Decreased platelet count Grade 1 03/08/21 03/15/21 ?Resolved None Unrelated None   ?Hypokalemia Grade 1 05/10/21 Ongoing None Unrelated Oral and IV K+   ?Hypoalbuminemia Grade 1 04/19/21 05/10/21 ?Resolved None Unrelated None   ?Hypoalbuminemia Grade 2 05/10/21 Ongoing None Unrelated None   ?Tachycardia Grade 1 03/15/21 Ongoing None Unrelated None   ?Arthralgia (left hip pain) Grade 2 11/15/20 02/22/21 None Unrelated None   ?Arthralgia (left hip pain) Grade 1 02/22/21 Ongoing None Unrelated None   ?Anemia Grade 3 05/10/21 Ongoing None Unrelated Transfusion   ?Weight loss Grade 2 03/15/21 Ongoing None Unrelated Supplements and nutritionist consult   ?Fall Grade 1 05/03/21 05/03/21 None Unrelated None   ?Anorexia Grade 2 04/26/21 Ongoing None Unrelated Remeron and supplemental nutrition   ?Fatigue Grade 2 04/26/21 Ongoing None Unrelated Remeron and steroids   ?Depression Grade 2 05/10/21 Ongoing None Unrelated Remeron    ? ? ?DISPOSITION: ?Upon completion off all study requirements, patient was escorted to exit with belongings.  Will f/u with patient at next visit in 3 months. ? ?IMAGING & DISEASE STATUS: ? Restaging CT Chest/Abd/Pelvis was done on 04/07/21 and discussed with MD on 04/12/21.  The scan showed disease progression.  Patient started second line chemotherapy with Zepzelca 3.2 mg/m2 IV every 3 weeks, started 04/20/21 and is to continue on this regimen today.  Etoposide and imfinzi treatments discontinued.  Patient not receiving any study intervention at this time (Arm 1), remains on study. ? ?The patient was thanked for their time and continued voluntary participation in this study. Patient Adam [name] has been provided direct contact information and is encouraged to contact this Nurse for any needs or questions. ? ?Wells Guiles 'Learta Codding' Merl Bommarito, RN, BSN ?Clinical Research Nurse I ?05/10/21 ?12:16 PM ? ? ?

## 2021-05-10 NOTE — Progress Notes (Signed)
Patient to receive 77meeq of K+ today with 1 unit PRBCs.  Patient will return on Friday at [facility] for 2nd unit ordered.  Patient is aware of changes to care plan and stated understanding of need for return visit.  Patient educated to keep blue blood bank bracelet in place ?

## 2021-05-10 NOTE — Progress Notes (Signed)
Nutrition Follow-up: ? ?Patient with progression of metastatic lung cancer. He is currently receiving third line chemotherapy with zepleca q3w (s/p first cycle on 4/12). ? ?Met with patient during infusion. He is somber during visit today. Patient reports decreased appetite and feeling depressed in the last few weeks related to disease progression. He discussed this with provider today. Dr. Julien Nordmann has prescribed steroid taper. Patient is hopeful this will "perk him up." Patient recalls pancakes and sausage for breakfast, 2 piece with mashed potato and biscuit from [facility] for lunch and chicken pot pie for dinner. He is drinking "good bit of water." Patient reports running out of Ensure 2 days ago. Patient denies nausea, vomiting, constipation. He endorses one day of diarrhea last week. This has resolved.   ? ?Medications: medrol dosepak, remeron ?5/3 CAM - potassium chloride 10 mEq in 100 ml IVPB ? ?Labs: Hgb 7.8 (scheduled for I unit blood on 5/5), K 2.8, Glucose 113 ? ?Anthropometrics: Weight 162 lb 2 oz today decreased 4% in 3 weeks; significant  ? ?4/12 - 169 lb 8 oz ?3/8 - 169 lb 1.6 oz  ? ?NUTRITION DIAGNOSIS: Inadequate oral intake continues ? ?INTERVENTION:  ? Encouraged small frequent meals and snacks with adequate calories and protein ?Continue 2-3 Ensure Plus/equivalent daily ?One complimentary case of Ensure Plus High Protein provided  ?Supportive listening and encouragement provided  ?Will send referral to LCSW  ? ? ?MONITORING, EVALUATION, GOAL: weight trends, intake ? ? ?NEXT VISIT: Wednesday June 14 during infusion  ? ? ? ?

## 2021-05-10 NOTE — Progress Notes (Signed)
Per Dr. Julien Nordmann, pt okay to treat with Zepzelca today with Hgb of 7.8 and K of 2.6. ? ?Pt will received 20mg  of K via IV today and 40mg  tablets are being she to his pharmacy. ?

## 2021-05-10 NOTE — Progress Notes (Signed)
?    [facility] ?[phone]   [phone] ? ?OFFICE PROGRESS NOTE ? ?[place], Blondell Reveal, MD ?[phone] ?[place] [address] ? ?DIAGNOSIS: Metastatic small cell lung cancer initially diagnosed as Limited stage (T1c, N2, M0) small cell lung cancer diagnosed in February 2022. ? ?PRIOR THERAPY:  ?1) Systemic chemotherapy according to phase 3 randomized clinical trial of chemoradiation versus chemoradiation plus atezolizumab status post 3 cycles.  This treatment was discontinued secondary to intolerance. ?2) palliative radiotherapy to the left hip area under the care of Dr. Tammi Klippel completed January 03, 2021. ?3) Second line systemic chemotherapy with carboplatin for AUC of 5 on day 1, etoposide 100 Mg/M2 on days 1, 2 and 3 as well as Imfinzi 1500 Mg IV on day 1 and Cosela 240 Mg/M2 before chemotherapy on days 1, 2 and 3 every 3 weeks.  First dose December 13, 2020.  Status post 5 cycles.  Starting from cycle #5 the patient will be on maintenance treatment with Imfinzi every 4 weeks.  This was discontinued secondary to disease progression ? ?CURRENT THERAPY: Third line systemic chemotherapy with Zepzelca (lurbinectedin) 3.2 Mg/M2 IV every 3 weeks.  First dose 04/19/2021.  Status post 1 cycle. ? ?INTERVAL HISTORY: ?Adam [name] 66 y.o. male returns to the clinic today for follow-up visit.  The patient is feeling fine today with no concerning complaints except for fatigue and feeling depressed a little bit about his current situation.  He denied having any current chest pain, shortness of breath, cough or hemoptysis.  He has no nausea, vomiting, diarrhea or constipation.  He has no headache or visual changes.  He has no significant bony pains at this point.  He lost around 8 pounds since his last visit because of his depression and lack of appetite.  He is here today for evaluation before starting cycle #2 of his treatment. ? ?MEDICAL HISTORY: ?Past Medical History:   ?Diagnosis Date  ? Hypertension   ? Lung cancer ([place])   ? small cell RUL  ? ? ?ALLERGIES:  has No Known Allergies. ? ?MEDICATIONS:  ?Current Outpatient Medications  ?Medication Sig Dispense Refill  ? acetaminophen (TYLENOL) 325 MG tablet Take 650 mg by mouth every 6 (six) hours as needed.    ? amLODipine (NORVASC) 10 MG tablet Take 10 mg by mouth daily.    ? bisacodyl (DULCOLAX) 5 MG EC tablet Take 5 mg by mouth daily as needed for moderate constipation.    ? ibuprofen (ADVIL) 600 MG tablet Take 600 mg by mouth every 6 (six) hours as needed.    ? lidocaine-prilocaine (EMLA) cream Apply 1 application topically as needed. 30 g 2  ? lisinopril-hydrochlorothiazide (ZESTORETIC) 20-12.5 MG tablet Take 1 tablet by mouth daily. 90 tablet 1  ? nystatin (MYCOSTATIN) 100000 UNIT/ML suspension Take 5 mLs (500,000 Units total) by mouth 4 (four) times daily. 200 mL 0  ? ?No current facility-administered medications for this visit.  ? ? ?SURGICAL HISTORY:  ?Past Surgical History:  ?Procedure Laterality Date  ? BRONCHIAL BRUSHINGS  02/08/2020  ? Procedure: BRONCHIAL BRUSHINGS;  Surgeon: Freddi Starr, MD;  Location: [facility] ENDOSCOPY;  Service: Pulmonary;;  ? BRONCHIAL NEEDLE ASPIRATION BIOPSY  02/08/2020  ? Procedure: BRONCHIAL NEEDLE ASPIRATION BIOPSIES;  Surgeon: Freddi Starr, MD;  Location: [facility] ENDOSCOPY;  Service: Pulmonary;;  ? IR IMAGING GUIDED PORT INSERTION  12/30/2020  ? KNEE SURGERY Right   ? SHOULDER SURGERY Left   ? VIDEO BRONCHOSCOPY WITH ENDOBRONCHIAL  ULTRASOUND N/A 02/08/2020  ? Procedure: VIDEO BRONCHOSCOPY WITH ENDOBRONCHIAL ULTRASOUND;  Surgeon: Freddi Starr, MD;  Location: [facility] ENDOSCOPY;  Service: Pulmonary;  Laterality: N/A;  ? ? ?REVIEW OF SYSTEMS:  Constitutional: positive for anorexia, fatigue, and weight loss ?Eyes: negative ?Ears, nose, mouth, throat, and face: negative ?Respiratory: negative ?Cardiovascular: negative ?Gastrointestinal: negative ?Genitourinary:negative ?Integument/breast:  negative ?Hematologic/lymphatic: negative ?Musculoskeletal:positive for arthralgias ?Neurological: negative ?Behavioral/Psych: negative ?Endocrine: negative ?Allergic/Immunologic: negative  ? ?PHYSICAL EXAMINATION: General appearance: alert, cooperative, fatigued, and no distress ?Head: Normocephalic, without obvious abnormality, atraumatic ?Neck: no adenopathy, no JVD, supple, symmetrical, trachea midline, and thyroid not enlarged, symmetric, no tenderness/mass/nodules ?Lymph nodes: Cervical, supraclavicular, and axillary nodes normal. ?Resp: clear to auscultation bilaterally ?Back: symmetric, no curvature. ROM normal. No CVA tenderness. ?Cardio: regular rate and rhythm, S1, S2 normal, no murmur, click, rub or gallop ?GI: soft, non-tender; bowel sounds normal; no masses,  no organomegaly ?Extremities: extremities normal, atraumatic, no cyanosis or edema ?Neurologic: Alert and oriented X 3, normal strength and tone. Normal symmetric reflexes. Normal coordination and gait ? ?ECOG PERFORMANCE STATUS: 1 - Symptomatic but completely ambulatory ? ?Blood pressure 112/71, pulse (!) 115, temperature (!) 96.8 ?F (36 ?C), temperature source Tympanic, resp. rate 17, weight 162 lb 2 oz (73.5 kg), SpO2 100 %. ? ?LABORATORY DATA: ?Lab Results  ?Component Value Date  ? WBC 6.4 05/10/2021  ? HGB 7.8 (L) 05/10/2021  ? HCT 24.0 (L) 05/10/2021  ? MCV 83.0 05/10/2021  ? PLT 210 05/10/2021  ? ? ?  Chemistry   ?   ?Component Value Date/Time  ? NA 137 04/19/2021 0757  ? K 3.7 04/19/2021 0757  ? CL 102 04/19/2021 0757  ? CO2 28 04/19/2021 0757  ? BUN 10 04/19/2021 0757  ? CREATININE 0.75 04/19/2021 0757  ?    ?Component Value Date/Time  ? CALCIUM 9.8 04/19/2021 0757  ? ALKPHOS 62 04/19/2021 0757  ? AST 13 (L) 04/19/2021 0757  ? ALT 10 04/19/2021 0757  ? BILITOT 0.4 04/19/2021 0757  ?  ? ? ? ?RADIOGRAPHIC STUDIES: ?No results found. ? ?ASSESSMENT AND PLAN: This is a very pleasant 66 years old African-American male recently diagnosed with  limited stage small cell lung cancer in February 2022.  The patient is currently undergoing treatment according to the [place] clinical trial where the patient would be randomized to treatment with concurrent chemoradiation with cisplatin and/or etoposide versus concurrent chemoradiation with the same regimen in addition to atezolizumab. ?He is status post 3 cycles of the treatment.  His treatment was discontinued secondary to intolerance and the patient has been in observation since April 2022. ?He had MRI of the brain performed recently that showed no evidence of metastatic disease to the brain. ?Repeat CT scan of the chest, abdomen and pelvis unfortunately showed evidence for disease progression involving new mass centered in the superior aspect of the right major fissure, widespread nodularity in the adjacent portion of the upper right lung, multiple new pleural-based lesions in the right hemithorax, new middle mediastinal lymphadenopathy, new hypervascular lesion in the proximal body of the pancreas and new lytic lesions in the parasymphyseal region of the left to pubic bone. ?His insurance declined a PET scan. ?The patient is started palliative systemic chemotherapy with carboplatin for AUC of 5 on day 1, etoposide 100 Mg/M2 on days 1, 2 and 3 with Cosela 4 Myeloprotection before chemotherapy and Imfinzi 1500 Mg IV every 3 weeks with the induction phase followed by maintenance Imfinzi every 4 weeks if the  patient has no evidence for disease progression.  He is status post 5 cycles.  Starting from cycle #5 he will be on treatment with Imfinzi every 4 weeks.  His treatment was discontinued after cycle #5 secondary to disease progression. ?The patient is currently undergoing a third line treatment with Zepzelca (lurbinectedin) 3.2 Mg/M2 every 3 weeks status post 1 cycle.  He tolerated the first cycle of his treatment well except for fatigue and anemia. ?I recommended for him to proceed with cycle #2 today as  planned. ?For the chemotherapy-induced anemia, I will arrange for the patient to receive 2 units of PRBCs transfusion this week. ?For the weight loss and lack of appetite as well as depression, I will start the patient on M

## 2021-05-10 NOTE — Progress Notes (Signed)
Per Dr. Julien Nordmann, pt okay to treat with HR of 105. ?

## 2021-05-10 NOTE — Patient Instructions (Signed)
[facility]  Discharge Instructions: ?Thank you for choosing [facility] to provide your oncology and hematology care.  ? ?If you have a lab appointment with the [facility], please go directly to the [facility] and check in at the registration area. ?  ?Wear comfortable clothing and clothing appropriate for easy access to any Portacath or PICC line.  ? ?We strive to give you quality time with your provider. You may need to reschedule your appointment if you arrive late (15 or more minutes).  Arriving late affects you and other patients whose appointments are after yours.  Also, if you miss three or more appointments without notifying the office, you may be dismissed from the clinic at the provider?s discretion.    ?  ?For prescription refill requests, have your pharmacy contact our office and allow 72 hours for refills to be completed.   ? ?Today you received the following chemotherapy and/or immunotherapy agent: Zepzelca    ?  ?To help prevent nausea and vomiting after your treatment, we encourage you to take your nausea medication as directed. ? ?BELOW ARE SYMPTOMS THAT SHOULD BE REPORTED IMMEDIATELY: ?*FEVER GREATER THAN 100.4 F (38 ?C) OR HIGHER ?*CHILLS OR SWEATING ?*NAUSEA AND VOMITING THAT IS NOT CONTROLLED WITH YOUR NAUSEA MEDICATION ?*UNUSUAL SHORTNESS OF BREATH ?*UNUSUAL BRUISING OR BLEEDING ?*URINARY PROBLEMS (pain or burning when urinating, or frequent urination) ?*BOWEL PROBLEMS (unusual diarrhea, constipation, pain near the anus) ?TENDERNESS IN MOUTH AND THROAT WITH OR WITHOUT PRESENCE OF ULCERS (sore throat, sores in mouth, or a toothache) ?UNUSUAL RASH, SWELLING OR PAIN  ?UNUSUAL VAGINAL DISCHARGE OR ITCHING  ? ?Items with * indicate a potential emergency and should be followed up as soon as possible or go to the Emergency Department if any problems should occur. ? ?Please show the CHEMOTHERAPY ALERT CARD or IMMUNOTHERAPY ALERT CARD at check-in to  the Emergency Department and triage nurse. ? ?Should you have questions after your visit or need to cancel or reschedule your appointment, please contact [facility]  Dept: [phone]  and follow the prompts.  Office hours are 8:00 a.m. to 4:30 p.m. Monday - Friday. Please note that voicemails left after 4:00 p.m. may not be returned until the following business day.  We are closed weekends and major holidays. You have access to a nurse at all times for urgent questions. Please call the main number to the clinic Dept: [phone] and follow the prompts. ? ? ?For any non-urgent questions, you may also contact your provider using MyChart. We now offer e-Visits for anyone 58 and older to request care online for non-urgent symptoms. For details visit [email or web address]. ?  ?Also download the MyChart app! Go to the app store, search "MyChart", open the app, select [facility], and log in with your MyChart username and password. ? ?Due to Covid, a mask is required upon entering the hospital/clinic. If you do not have a mask, one will be given to you upon arrival. For doctor visits, patients may have 1 support person aged 85 or older with them. For treatment visits, patients cannot have anyone with them due to current Covid guidelines and our immunocompromised population.  ? ?Blood Transfusion, Adult, Care After ?This sheet gives you information about how to care for yourself after your procedure. Your doctor may also give you more specific instructions. If you have problems or questions, contact your doctor. ?What can I expect after the procedure? ?After the procedure, it is  common to have: ?Bruising and soreness at the IV site. ?A headache. ?Follow these instructions at home: ?Insertion site care ? ?  ? ?Follow instructions from your doctor about how to take care of your insertion site. This is where an IV tube was put into your vein. Make sure you: ?Wash your hands with soap and  water before and after you change your bandage (dressing). If you cannot use soap and water, use hand sanitizer. ?Change your bandage as told by your doctor. ?Check your insertion site every day for signs of infection. Check for: ?Redness, swelling, or pain. ?Bleeding from the site. ?Warmth. ?Pus or a bad smell. ?General instructions ?Take over-the-counter and prescription medicines only as told by your doctor. ?Rest as told by your doctor. ?Go back to your normal activities as told by your doctor. ?Keep all follow-up visits as told by your doctor. This is important. ?Contact a doctor if: ?You have itching or red, swollen areas of skin (hives). ?You feel worried or nervous (anxious). ?You feel weak after doing your normal activities. ?You have redness, swelling, warmth, or pain around the insertion site. ?You have blood coming from the insertion site, and the blood does not stop with pressure. ?You have pus or a bad smell coming from the insertion site. ?Get help right away if: ?You have signs of a serious reaction. This may be coming from an allergy or the body's defense system (immune system). Signs include: ?Trouble breathing or shortness of breath. ?Swelling of the face or feeling warm (flushed). ?Fever or chills. ?Head, chest, or back pain. ?Dark pee (urine) or blood in the pee. ?Widespread rash. ?Fast heartbeat. ?Feeling dizzy or light-headed. ?You may receive your blood transfusion in an outpatient setting. If so, you will be told whom to contact to report any reactions. ?These symptoms may be an emergency. Do not wait to see if the symptoms will go away. Get medical help right away. Call your local emergency services (911 in the [place] Do not drive yourself to the hospital. ?Summary ?Bruising and soreness at the IV site are common. ?Check your insertion site every day for signs of infection. ?Rest as told by your doctor. Go back to your normal activities as told by your doctor. ?Get help right away if you  have signs of a serious reaction. ?This information is not intended to replace advice given to you by your health care provider. Make sure you discuss any questions you have with your health care provider. ?Document Revised: 04/21/2020 Document Reviewed: 06/19/2018 ?Elsevier Patient Education ? [place]. ? ? ?

## 2021-05-10 NOTE — Progress Notes (Signed)
Per Dr. Julien Nordmann, hold on filgrastim today.  Patient is returning on Friday ?

## 2021-05-11 ENCOUNTER — Encounter: Payer: Self-pay | Admitting: Licensed Clinical Social Worker

## 2021-05-11 DIAGNOSIS — C3491 Malignant neoplasm of unspecified part of right bronchus or lung: Secondary | ICD-10-CM

## 2021-05-11 NOTE — Progress Notes (Signed)
[facility] Clinical Social Work  ?Initial Assessment ? ? ?Adam [name] is a 66 y.o. year old male contacted by phone. Clinical Social Work was referred by medical provider for assessment of psychosocial needs.  ? ?SDOH (Social Determinants of Health) assessments performed: Yes ?  ?Distress Screen completed: No ? ?  01/31/2021  ?  1:51 PM  ?ONCBCN DISTRESS SCREENING  ?Distress experienced in past week (1-10) 3  ? ? ? ? ?Family/Social Information:  ?Housing Arrangement: patient lives alone ?Family members/support persons in your life? Pt states he has a number of family members who reside close by, including siblings who check in on him regularly.  Pt states his ex-wife also checks in on him and keeps their son, who resides in [place], updated on pt's condition. ?Transportation concerns: no  ?Employment: Retired. Income source: [place] ?Financial concerns:  Pt utilized financial resources throughout treatment ?Type of concern: Utilities ?Food access concerns: no ?Religious or spiritual practice: yes ?Services Currently in place:  none ? ?Coping/ Adjustment to diagnosis: ?Patient understands treatment plan and what happens next? yes ?Concerns about diagnosis and/or treatment:  Pt reports feelings of depression regarding recent progression and what that may mean for his quality of life ?Patient reported stressors: Facing my mortality ?Hopes and priorities: Pt's priority at present is to continue treatment with the hope of an improvement in his condition ?Patient enjoys time with family/ friends ?Current coping skills/ strengths: Supportive family/friends  ? ? ? SUMMARY: ?Current SDOH Barriers:  ?Pt's recent depression following progression of disease in recent imaging results ? ?Clinical Social Work Clinical Goal(s):  ?To provide appropriate support to pt to assist w/ depression associated w/ disease progression. ? ?Interventions: ?Discussed common feeling and emotions when being diagnosed with cancer,  and the importance of support during treatment ?Informed patient of the support team roles and support services at [facility] ?Provided CSW contact information and encouraged patient to call with any questions or concerns ?Discussed the [facility] w/ pt as well as individual counseling available.  Pt verbalized agreement that spending time w/ a support group may be beneficial, but is declining at present.  Pt aware he has an open invitation to any supportive services offered to which he verbalized appreciation. ? ? ?Follow Up Plan: CSW will follow-up with patient by phone in two weeks. ?Patient verbalizes understanding of plan: Yes ? ? ? ?Henriette Combs, LCSW ?

## 2021-05-12 ENCOUNTER — Other Ambulatory Visit: Payer: Self-pay

## 2021-05-12 ENCOUNTER — Encounter: Payer: Self-pay | Admitting: Internal Medicine

## 2021-05-12 ENCOUNTER — Inpatient Hospital Stay: Payer: Commercial Managed Care - HMO

## 2021-05-12 DIAGNOSIS — C3411 Malignant neoplasm of upper lobe, right bronchus or lung: Secondary | ICD-10-CM | POA: Diagnosis not present

## 2021-05-12 DIAGNOSIS — C3491 Malignant neoplasm of unspecified part of right bronchus or lung: Secondary | ICD-10-CM

## 2021-05-12 MED ORDER — SODIUM CHLORIDE 0.9% FLUSH: 3.0000 mL | INTRAVENOUS | Status: DC | PRN

## 2021-05-12 MED ORDER — HEPARIN SOD (PORK) LOCK FLUSH 100 UNIT/ML IV SOLN
250.0000 [IU] | INTRAVENOUS | Status: AC | PRN
  Administered 2021-05-12: 250 [IU]

## 2021-05-12 MED ORDER — ACETAMINOPHEN 325 MG PO TABS
650.0000 mg | ORAL_TABLET | Freq: Once | ORAL | Status: AC
  Administered 2021-05-12: 650 mg via ORAL
  Filled 2021-05-12: qty 2

## 2021-05-12 MED ORDER — DIPHENHYDRAMINE HCL 50 MG/ML IJ SOLN
25.0000 mg | Freq: Once | INTRAMUSCULAR | Status: AC
  Administered 2021-05-12: 25 mg via INTRAVENOUS
  Filled 2021-05-12: qty 1

## 2021-05-12 MED ORDER — SODIUM CHLORIDE 0.9% FLUSH
10.0000 mL | INTRAVENOUS | Status: AC | PRN
  Administered 2021-05-12: 10 mL

## 2021-05-12 NOTE — Patient Instructions (Signed)
Blood Transfusion, Adult, Care After ?This sheet gives you information about how to care for yourself after your procedure. Your health care provider may also give you more specific instructions. If you have problems or questions, contact your health care provider. ?What can I expect after the procedure? ?After the procedure, it is common to have: ?Bruising and soreness where the IV was inserted. ?A headache. ?Follow these instructions at home: ?IV insertion site care ? ?  ? ?Follow instructions from your health care provider about how to take care of your IV insertion site. Make sure you: ?Wash your hands with soap and water before and after you change your bandage (dressing). If soap and water are not available, use hand sanitizer. ?Change your dressing as told by your health care provider. ?Check your IV insertion site every day for signs of infection. Check for: ?Redness, swelling, or pain. ?Bleeding from the site. ?Warmth. ?Pus or a bad smell. ?General instructions ?Take over-the-counter and prescription medicines only as told by your health care provider. ?Rest as told by your health care provider. ?Return to your normal activities as told by your health care provider. ?Keep all follow-up visits as told by your health care provider. This is important. ?Contact a health care provider if: ?You have itching or red, swollen areas of skin (hives). ?You feel anxious. ?You feel weak after doing your normal activities. ?You have redness, swelling, warmth, or pain around the IV insertion site. ?You have blood coming from the IV insertion site that does not stop with pressure. ?You have pus or a bad smell coming from your IV insertion site. ?Get help right away if: ?You have symptoms of a serious allergic or immune system reaction, including: ?Trouble breathing or shortness of breath. ?Swelling of the face or feeling flushed. ?Fever or chills. ?Pain in the head, back, or chest. ?Dark urine or blood in the  urine. ?Widespread rash. ?Fast heartbeat. ?Feeling dizzy or light-headed. ?If you receive your blood transfusion in an outpatient setting, you will be told whom to contact to report any reactions. ?These symptoms may represent a serious problem that is an emergency. Do not wait to see if the symptoms will go away. Get medical help right away. Call your local emergency services (911 in the [place] Do not drive yourself to the hospital. ?Summary ?Bruising and tenderness around the IV insertion site are common. ?Check your IV insertion site every day for signs of infection. ?Rest as told by your health care provider. Return to your normal activities as told by your health care provider. ?Get help right away for symptoms of a serious allergic or immune system reaction to blood transfusion. ?This information is not intended to replace advice given to you by your health care provider. Make sure you discuss any questions you have with your health care provider. ?Document Revised: 04/21/2020 Document Reviewed: 06/19/2018 ?Elsevier Patient Education ? 2023 Elsevier Inc. ? ?

## 2021-05-13 LAB — TYPE AND SCREEN
ABO/RH(D): O POS
Antibody Screen: NEGATIVE
Unit division: 0
Unit division: 0

## 2021-05-13 LAB — BPAM RBC
Blood Product Expiration Date: 202305282359
Blood Product Expiration Date: 202305282359
ISSUE DATE / TIME: 202305031552
ISSUE DATE / TIME: 202305051021
Unit Type and Rh: 5100
Unit Type and Rh: 5100

## 2021-05-14 ENCOUNTER — Encounter: Payer: Self-pay | Admitting: Internal Medicine

## 2021-05-17 ENCOUNTER — Emergency Department (HOSPITAL_COMMUNITY): Payer: Commercial Managed Care - HMO

## 2021-05-17 ENCOUNTER — Inpatient Hospital Stay: Payer: Commercial Managed Care - HMO

## 2021-05-17 ENCOUNTER — Inpatient Hospital Stay (HOSPITAL_COMMUNITY)
Admission: EM | Admit: 2021-05-17 | Discharge: 2021-06-02 | DRG: 853 | Disposition: A | Discharge status: Home Health | Payer: Commercial Managed Care - HMO | Attending: Student | Admitting: Student

## 2021-05-17 ENCOUNTER — Inpatient Hospital Stay (HOSPITAL_COMMUNITY): Payer: Commercial Managed Care - HMO

## 2021-05-17 ENCOUNTER — Inpatient Hospital Stay (HOSPITAL_BASED_OUTPATIENT_CLINIC_OR_DEPARTMENT_OTHER): Payer: Commercial Managed Care - HMO | Admitting: Physician Assistant

## 2021-05-17 ENCOUNTER — Other Ambulatory Visit: Payer: Self-pay

## 2021-05-17 ENCOUNTER — Encounter (HOSPITAL_COMMUNITY): Payer: Self-pay | Admitting: Emergency Medicine

## 2021-05-17 ENCOUNTER — Encounter: Payer: Self-pay | Admitting: Internal Medicine

## 2021-05-17 VITALS — BP 91/65 | HR 120 | Temp 98.2°F | Resp 22

## 2021-05-17 DIAGNOSIS — R590 Localized enlarged lymph nodes: Secondary | ICD-10-CM | POA: Diagnosis present

## 2021-05-17 DIAGNOSIS — E860 Dehydration: Secondary | ICD-10-CM

## 2021-05-17 DIAGNOSIS — M8448XA Pathological fracture, other site, initial encounter for fracture: Secondary | ICD-10-CM | POA: Diagnosis present

## 2021-05-17 DIAGNOSIS — L02414 Cutaneous abscess of left upper limb: Secondary | ICD-10-CM | POA: Diagnosis present

## 2021-05-17 DIAGNOSIS — R7881 Bacteremia: Secondary | ICD-10-CM | POA: Diagnosis not present

## 2021-05-17 DIAGNOSIS — E876 Hypokalemia: Secondary | ICD-10-CM | POA: Diagnosis not present

## 2021-05-17 DIAGNOSIS — B9620 Unspecified Escherichia coli [E. coli] as the cause of diseases classified elsewhere: Secondary | ICD-10-CM | POA: Diagnosis not present

## 2021-05-17 DIAGNOSIS — D61818 Other pancytopenia: Secondary | ICD-10-CM | POA: Diagnosis not present

## 2021-05-17 DIAGNOSIS — A4151 Sepsis due to Escherichia coli [E. coli]: Secondary | ICD-10-CM | POA: Diagnosis present

## 2021-05-17 DIAGNOSIS — A4150 Gram-negative sepsis, unspecified: Secondary | ICD-10-CM | POA: Diagnosis not present

## 2021-05-17 DIAGNOSIS — D709 Neutropenia, unspecified: Secondary | ICD-10-CM

## 2021-05-17 DIAGNOSIS — D696 Thrombocytopenia, unspecified: Secondary | ICD-10-CM | POA: Diagnosis not present

## 2021-05-17 DIAGNOSIS — L03116 Cellulitis of left lower limb: Secondary | ICD-10-CM | POA: Diagnosis present

## 2021-05-17 DIAGNOSIS — R296 Repeated falls: Secondary | ICD-10-CM | POA: Diagnosis present

## 2021-05-17 DIAGNOSIS — Z87891 Personal history of nicotine dependence: Secondary | ICD-10-CM

## 2021-05-17 DIAGNOSIS — C3411 Malignant neoplasm of upper lobe, right bronchus or lung: Secondary | ICD-10-CM | POA: Diagnosis present

## 2021-05-17 DIAGNOSIS — T451X5A Adverse effect of antineoplastic and immunosuppressive drugs, initial encounter: Secondary | ICD-10-CM | POA: Diagnosis present

## 2021-05-17 DIAGNOSIS — R6520 Severe sepsis without septic shock: Secondary | ICD-10-CM | POA: Diagnosis present

## 2021-05-17 DIAGNOSIS — I959 Hypotension, unspecified: Secondary | ICD-10-CM | POA: Diagnosis present

## 2021-05-17 DIAGNOSIS — L02416 Cutaneous abscess of left lower limb: Secondary | ICD-10-CM | POA: Diagnosis present

## 2021-05-17 DIAGNOSIS — W19XXXA Unspecified fall, initial encounter: Secondary | ICD-10-CM

## 2021-05-17 DIAGNOSIS — R5381 Other malaise: Secondary | ICD-10-CM

## 2021-05-17 DIAGNOSIS — L080 Pyoderma: Secondary | ICD-10-CM

## 2021-05-17 DIAGNOSIS — C7931 Secondary malignant neoplasm of brain: Secondary | ICD-10-CM | POA: Diagnosis present

## 2021-05-17 DIAGNOSIS — I96 Gangrene, not elsewhere classified: Secondary | ICD-10-CM | POA: Diagnosis present

## 2021-05-17 DIAGNOSIS — D6959 Other secondary thrombocytopenia: Secondary | ICD-10-CM | POA: Diagnosis present

## 2021-05-17 DIAGNOSIS — I10 Essential (primary) hypertension: Secondary | ICD-10-CM | POA: Diagnosis present

## 2021-05-17 DIAGNOSIS — A419 Sepsis, unspecified organism: Secondary | ICD-10-CM | POA: Diagnosis not present

## 2021-05-17 DIAGNOSIS — C7951 Secondary malignant neoplasm of bone: Secondary | ICD-10-CM | POA: Diagnosis present

## 2021-05-17 DIAGNOSIS — D701 Agranulocytosis secondary to cancer chemotherapy: Secondary | ICD-10-CM | POA: Diagnosis present

## 2021-05-17 DIAGNOSIS — N179 Acute kidney failure, unspecified: Secondary | ICD-10-CM | POA: Diagnosis present

## 2021-05-17 DIAGNOSIS — L03114 Cellulitis of left upper limb: Secondary | ICD-10-CM

## 2021-05-17 DIAGNOSIS — D849 Immunodeficiency, unspecified: Secondary | ICD-10-CM | POA: Diagnosis present

## 2021-05-17 DIAGNOSIS — D61810 Antineoplastic chemotherapy induced pancytopenia: Secondary | ICD-10-CM | POA: Diagnosis present

## 2021-05-17 DIAGNOSIS — M7989 Other specified soft tissue disorders: Secondary | ICD-10-CM | POA: Diagnosis not present

## 2021-05-17 DIAGNOSIS — L0390 Cellulitis, unspecified: Secondary | ICD-10-CM

## 2021-05-17 DIAGNOSIS — Z79899 Other long term (current) drug therapy: Secondary | ICD-10-CM | POA: Diagnosis not present

## 2021-05-17 LAB — CBC WITH DIFFERENTIAL/PLATELET
Abs Immature Granulocytes: 0 10*3/uL (ref 0.00–0.07)
Basophils Absolute: 0 10*3/uL (ref 0.0–0.1)
Basophils Relative: 0 %
Eosinophils Absolute: 0 10*3/uL (ref 0.0–0.5)
Eosinophils Relative: 0 %
HCT: 30.7 % — ABNORMAL LOW (ref 39.0–52.0)
Hemoglobin: 9.9 g/dL — ABNORMAL LOW (ref 13.0–17.0)
Immature Granulocytes: 0 %
Lymphocytes Relative: 96 %
Lymphs Abs: 0.2 10*3/uL — ABNORMAL LOW (ref 0.7–4.0)
MCH: 28 pg (ref 26.0–34.0)
MCHC: 32.2 g/dL (ref 30.0–36.0)
MCV: 86.7 fL (ref 80.0–100.0)
Monocytes Absolute: 0 10*3/uL — ABNORMAL LOW (ref 0.1–1.0)
Monocytes Relative: 4 %
Neutro Abs: 0 10*3/uL — CL (ref 1.7–7.7)
Neutrophils Relative %: 0 %
Platelets: 74 10*3/uL — ABNORMAL LOW (ref 150–400)
RBC: 3.54 MIL/uL — ABNORMAL LOW (ref 4.22–5.81)
RDW: 15.8 % — ABNORMAL HIGH (ref 11.5–15.5)
WBC: 0.3 10*3/uL — CL (ref 4.0–10.5)
nRBC: 0 % (ref 0.0–0.2)

## 2021-05-17 LAB — COMPREHENSIVE METABOLIC PANEL
ALT: 34 U/L (ref 0–44)
AST: 29 U/L (ref 15–41)
Albumin: 2.7 g/dL — ABNORMAL LOW (ref 3.5–5.0)
Alkaline Phosphatase: 54 U/L (ref 38–126)
Anion gap: 7 (ref 5–15)
BUN: 25 mg/dL — ABNORMAL HIGH (ref 8–23)
CO2: 25 mmol/L (ref 22–32)
Calcium: 8.6 mg/dL — ABNORMAL LOW (ref 8.9–10.3)
Chloride: 105 mmol/L (ref 98–111)
Creatinine, Ser: 1.53 mg/dL — ABNORMAL HIGH (ref 0.61–1.24)
GFR, Estimated: 50 mL/min — ABNORMAL LOW (ref >60)
Glucose, Bld: 121 mg/dL — ABNORMAL HIGH (ref 70–99)
Potassium: 4.3 mmol/L (ref 3.5–5.1)
Sodium: 137 mmol/L (ref 135–145)
Total Bilirubin: 0.9 mg/dL (ref 0.3–1.2)
Total Protein: 7.5 g/dL (ref 6.5–8.1)

## 2021-05-17 LAB — URINALYSIS, ROUTINE W REFLEX MICROSCOPIC
Bilirubin Urine: NEGATIVE
Glucose, UA: NEGATIVE mg/dL
Hgb urine dipstick: NEGATIVE
Ketones, ur: NEGATIVE mg/dL
Leukocytes,Ua: NEGATIVE
Nitrite: POSITIVE — AB
Protein, ur: 30 mg/dL — AB
Specific Gravity, Urine: 1.012 (ref 1.005–1.030)
pH: 7 (ref 5.0–8.0)

## 2021-05-17 LAB — LACTIC ACID, PLASMA
Lactic Acid, Venous: 1.9 mmol/L (ref 0.5–1.9)
Lactic Acid, Venous: 2.7 mmol/L (ref 0.5–1.9)

## 2021-05-17 LAB — PROTIME-INR
INR: 1.2 (ref 0.8–1.2)
Prothrombin Time: 14.9 seconds (ref 11.4–15.2)

## 2021-05-17 MED ORDER — LACTATED RINGERS IV SOLN: INTRAVENOUS | Status: DC

## 2021-05-17 MED ORDER — LACTATED RINGERS IV BOLUS (SEPSIS)
1000.0000 mL | Freq: Once | INTRAVENOUS | Status: AC
  Administered 2021-05-17 (×2): 1000 mL via INTRAVENOUS

## 2021-05-17 MED ORDER — SODIUM CHLORIDE 0.9% FLUSH
10.0000 mL | INTRAVENOUS | Status: DC | PRN
  Administered 2021-05-20: 10 mL

## 2021-05-17 MED ORDER — TBO-FILGRASTIM 300 MCG/0.5ML ~~LOC~~ SOSY
300.0000 ug | PREFILLED_SYRINGE | Freq: Every day | SUBCUTANEOUS | Status: DC
  Administered 2021-05-17: 300 ug via SUBCUTANEOUS
  Filled 2021-05-17: qty 0.5

## 2021-05-17 MED ORDER — LIDOCAINE HCL (PF) 1 % IJ SOLN
30.0000 mL | Freq: Once | INTRAMUSCULAR | Status: AC
  Administered 2021-05-17: 30 mL
  Filled 2021-05-17: qty 30

## 2021-05-17 MED ORDER — LACTATED RINGERS IV BOLUS (SEPSIS)
250.0000 mL | Freq: Once | INTRAVENOUS | Status: AC
  Administered 2021-05-17: 250 mL via INTRAVENOUS

## 2021-05-17 MED ORDER — ACETAMINOPHEN 650 MG RE SUPP: 650.0000 mg | Freq: Four times a day (QID) | RECTAL | Status: DC | PRN

## 2021-05-17 MED ORDER — VANCOMYCIN HCL IN DEXTROSE 1-5 GM/200ML-% IV SOLN: 1000.0000 mg | Freq: Once | INTRAVENOUS | Status: DC

## 2021-05-17 MED ORDER — VANCOMYCIN HCL 1250 MG/250ML IV SOLN
1250.0000 mg | INTRAVENOUS | Status: DC
  Administered 2021-05-18: 1250 mg via INTRAVENOUS
  Filled 2021-05-17: qty 250

## 2021-05-17 MED ORDER — HYDROCODONE-ACETAMINOPHEN 5-325 MG PO TABS: 1.0000 | ORAL_TABLET | ORAL | Status: DC | PRN

## 2021-05-17 MED ORDER — SODIUM CHLORIDE 0.9 % IV SOLN
2.0000 g | INTRAVENOUS | Status: DC
  Administered 2021-05-18 – 2021-05-20 (×3): 2 g via INTRAVENOUS
  Filled 2021-05-17 (×4): qty 20

## 2021-05-17 MED ORDER — CHLORHEXIDINE GLUCONATE CLOTH 2 % EX PADS
6.0000 | MEDICATED_PAD | Freq: Every day | CUTANEOUS | Status: DC
  Administered 2021-05-18 – 2021-05-27 (×10): 6 via TOPICAL

## 2021-05-17 MED ORDER — VANCOMYCIN HCL 1500 MG/300ML IV SOLN
1500.0000 mg | Freq: Once | INTRAVENOUS | Status: AC
  Administered 2021-05-17: 1500 mg via INTRAVENOUS
  Filled 2021-05-17 (×2): qty 300

## 2021-05-17 MED ORDER — SODIUM CHLORIDE 0.9 % IV SOLN
2.0000 g | Freq: Once | INTRAVENOUS | Status: AC
  Administered 2021-05-17: 2 g via INTRAVENOUS
  Filled 2021-05-17: qty 20

## 2021-05-17 MED ORDER — SODIUM CHLORIDE 0.9 % IV SOLN: INTRAVENOUS | Status: AC

## 2021-05-17 MED ORDER — LACTATED RINGERS IV BOLUS
250.0000 mL | Freq: Once | INTRAVENOUS | Status: AC
  Administered 2021-05-17: 250 mL via INTRAVENOUS

## 2021-05-17 MED ORDER — ACETAMINOPHEN 325 MG PO TABS
650.0000 mg | ORAL_TABLET | Freq: Four times a day (QID) | ORAL | Status: DC | PRN
  Administered 2021-05-21 – 2021-05-26 (×2): 650 mg via ORAL
  Filled 2021-05-17 (×2): qty 2

## 2021-05-17 MED ORDER — LACTATED RINGERS IV BOLUS (SEPSIS)
1000.0000 mL | Freq: Once | INTRAVENOUS | Status: AC
  Administered 2021-05-17: 1000 mL via INTRAVENOUS

## 2021-05-17 NOTE — H&P (Signed)
?History and Physical  ? ? ?Patient: Adam [name] DOB: 08-02-1955 ?DOA: 05/17/2021 ?DOS: the patient was seen and examined on 05/17/2021 ?PCP: de [place], [name], MD  ?Patient coming from: Home ? ?Chief Complaint:  ?Chief Complaint  ?Patient presents with  ? Fall  ? Tachycardia  ? Wound Check  ? ?HPI: Adam [name] is a 66 y.o. male with medical history significant of metastatic small cell lung cancer, HTN. Presenting with multiple falls over the last week. Today presented to his [facility] clinic for regular follow up and with concerns of left knee and left forearm swelling. During his visit, he fell after he became weak. He was assessed by the [facility] Ctr staff and sent to the ED for evaluation.  ? ?His wife reports that he's had multiple falls over the last week. He seems weaker in general during that time. He denies tripping. He denies any CP, palpitations, room spinning or dyspnea prior to these falls. He hasn't hit his head or suffered LOC. He has just felt weak and off balance. His wife reports that these symptoms came on after they noted swelling in his left knee and left forearm. The swelling had been present for around 2 weeks and had become more red in the last week. He has had low grade temps of 100 degrees. They have tried to keep the areas clean. They deny any trauma to the areas. They otherwise denies any other aggravating or alleviating factors.   ? ?Review of Systems: As mentioned in the history of present illness. All other systems reviewed and are negative. ?Past Medical History:  ?Diagnosis Date  ? Hypertension   ? Lung cancer ([place])   ? small cell RUL  ? ?Past Surgical History:  ?Procedure Laterality Date  ? BRONCHIAL BRUSHINGS  02/08/2020  ? Procedure: BRONCHIAL BRUSHINGS;  Surgeon: Freddi Starr, MD;  Location: [facility] ENDOSCOPY;  Service: Pulmonary;;  ? BRONCHIAL NEEDLE ASPIRATION BIOPSY  02/08/2020  ? Procedure: BRONCHIAL NEEDLE ASPIRATION BIOPSIES;  Surgeon: Freddi Starr, MD;  Location: [facility]  ENDOSCOPY;  Service: Pulmonary;;  ? IR IMAGING GUIDED PORT INSERTION  12/30/2020  ? KNEE SURGERY Right   ? SHOULDER SURGERY Left   ? VIDEO BRONCHOSCOPY WITH ENDOBRONCHIAL ULTRASOUND N/A 02/08/2020  ? Procedure: VIDEO BRONCHOSCOPY WITH ENDOBRONCHIAL ULTRASOUND;  Surgeon: Freddi Starr, MD;  Location: [facility] ENDOSCOPY;  Service: Pulmonary;  Laterality: N/A;  ? ?Social History:  reports that he quit smoking about 2 years ago. His smoking use included cigarettes. He has a 20.00 pack-year smoking history. He has never used smokeless tobacco. He reports that he does not currently use alcohol. He reports that he does not use drugs. ? ?No Known Allergies ? ?Family History  ?Problem Relation Age of Onset  ? Heart disease Mother   ? Cancer Cousin   ? Breast cancer Neg Hx   ? Colon cancer Neg Hx   ? Pancreatic cancer Neg Hx   ? Prostate cancer Neg Hx   ? ? ?Prior to Admission medications   ?Medication Sig Start Date End Date Taking? Authorizing Provider  ?acetaminophen (TYLENOL) 325 MG tablet Take 650 mg by mouth every 6 (six) hours as needed.    [provider]  ?amLODipine (NORVASC) 10 MG tablet Take 10 mg by mouth daily. 12/14/19   [provider]  ?bisacodyl (DULCOLAX) 5 MG EC tablet Take 5 mg by mouth daily as needed for moderate constipation.    [provider]  ?ibuprofen (ADVIL) 600 MG  tablet Take 600 mg by mouth every 6 (six) hours as needed. 10/06/19   [provider]  ?lidocaine-prilocaine (EMLA) cream Apply 1 application topically as needed. 01/11/21   Heilingoetter, Cassandra L, PA-C  ?lisinopril-hydrochlorothiazide (ZESTORETIC) 20-12.5 MG tablet Take 1 tablet by mouth daily. 11/15/20   [place], [name], MD  ?methylPREDNISolone (MEDROL DOSEPAK) 4 MG TBPK tablet Use as instructed 05/10/21   Curt Bears, MD  ?mirtazapine (REMERON) 15 MG tablet Take 2 tablets (30 mg total) by mouth at bedtime. 05/10/21   Curt Bears, MD  ?nystatin (MYCOSTATIN) 100000 UNIT/ML suspension Take 5  mLs (500,000 Units total) by mouth 4 (four) times daily. 02/01/21   Heilingoetter, Cassandra L, PA-C  ?potassium chloride SA (KLOR-CON M) 20 MEQ tablet Take 1 tablet (20 mEq total) by mouth 2 (two) times daily. 05/10/21   Curt Bears, MD  ? ? ?Physical Exam: ?Vitals:  ? 05/17/21 1335 05/17/21 1406 05/17/21 1438 05/17/21 1500  ?BP: 110/79 119/80 117/84 110/73  ?Pulse: (!) 110 (!) 121 (!) 112 (!) 112  ?Resp: 18 18 18 17   ?Temp:      ?TempSrc:      ?SpO2: 100% 96% 100% 100%  ? ?General: 66 y.o. male resting in bed in NAD ?Eyes: PERRL, normal sclera ?ENMT: Nares patent w/o discharge, orophaynx clear, dentition normal, ears w/o discharge/lesions/ulcers ?Neck: Supple, trachea midline ?Cardiovascular: tachy, +S1, S2, no m/g/r, equal pulses throughout ?Respiratory: CTABL, no w/r/r, normal WOB ?GI: BS+, NDNT, no masses noted, no organomegaly noted ?MSK: No c/c; LUE swelling and drainage, L knee swelling and drainage ?Neuro: A&O x 3, no focal deficits ?Psyc: Appropriate interaction and affect, calm/cooperative ? ?Data Reviewed: ? ?Scr  1.53 ?BUN  25 ?WBC  0.3 ?Hgb 9.9 ?Plt  74 ?Lactic acid 1.9 ? ?CXR:  ?No evidence of pneumonia. 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 ? ?Assessment and Plan: ?No notes have been filed under this hospital service. ?Service: Hospitalist ?Sepsis secondary to L forarm and Left knee abscesses/cellulitis ?    - admit to inpt, progressive ?    - continue rocephin and vanc ?    - follow blood Cx ?    - the wounds were opened by EDP; freely drained;  consult WOCN ?    - check XR LUE and L knee ? ?Neutropenia in setting of sepsis ?    - abs neut count is 0 ?    - spoke with onco; will order granix and follow ? ?AKI ?    - likely secondary to above ?    - fluids, renal US, follow ? ?Generalized weakness ?Multiple falls ?    - likely secondary to above ?    - PT/OT eval ? ?Pancytopenia ?    - on chemo w/ Dr Julien Nordmann; spoke with Onco App; onco to follow ?     - no evidence of bleed ? ?Hx of HTN ?    - pressures are soft right now ?    - hold home regimen for tonight ?    - continue fluids ? ?Metastatic small cell lung cancer ?    - follows w/ Dr. Julien Nordmann; onco aware patient is hospitalized ? ? ? Advance Care Planning:   Code Status: FULL ? ?Consults: Onco (Dr. Julien Nordmann) ? ?Family Communication: w/ wife at bedside ? ?Severity of Illness: ?The appropriate patient status for this patient is INPATIENT. Inpatient status is judged to be reasonable and necessary in order to provide the required  intensity of service to ensure the patient's safety. The patient's presenting symptoms, physical exam findings, and initial radiographic and laboratory data in the context of their chronic comorbidities is felt to place them at high risk for further clinical deterioration. Furthermore, it is not anticipated that the patient will be medically stable for discharge from the hospital within 2 midnights of admission.  ? ?* I certify that at the point of admission it is my clinical judgment that the patient will require inpatient hospital care spanning beyond 2 midnights from the point of admission due to high intensity of service, high risk for further deterioration and high frequency of surveillance required.* ? ?Author: ?Jonnie Finner, DO ?05/17/2021 3:30 PM ? ?For on call review [email or web address].  ?

## 2021-05-17 NOTE — ED Provider Notes (Signed)
?[facility] DEPT ?Provider Note ? ? ?CSN: [id number] ?Arrival date & time: 05/17/21  1130 ? ?  ? ?History ? ?Chief Complaint  ?Patient presents with  ? Fall  ? Tachycardia  ? Wound Check  ? ? ?Adam [name] is a 66 y.o. male. ? ?HPI ?Patient presents from the cancer center, transported by one of the nurses, with whom I discussed his presentation.  Patient is also coming by his sister. ?Patient is undergoing chemotherapy for non-small cell lung cancer.  Today had a 1 unwitnessed fall, though he denies hitting any extremity during the fall, he does acknowledge weakness, and per cancer center nurse the patient is hypotensive, tachycardic, and with concern for obvious wounds on his left knee and elbow was brought here for consideration of fall, wounds, sepsis.  Patient denies focal pain, denies confusion, denies nausea.  Last chemotherapy was 1 week ago. ?  ? ?Home Medications ?Prior to Admission medications   ?Medication Sig Start Date End Date Taking? Authorizing Provider  ?acetaminophen (TYLENOL) 325 MG tablet Take 650 mg by mouth every 6 (six) hours as needed.    [provider]  ?amLODipine (NORVASC) 10 MG tablet Take 10 mg by mouth daily. 12/14/19   [provider]  ?bisacodyl (DULCOLAX) 5 MG EC tablet Take 5 mg by mouth daily as needed for moderate constipation.    [provider]  ?ibuprofen (ADVIL) 600 MG tablet Take 600 mg by mouth every 6 (six) hours as needed. 10/06/19   [provider]  ?lidocaine-prilocaine (EMLA) cream Apply 1 application topically as needed. 01/11/21   Heilingoetter, Cassandra L, PA-C  ?lisinopril-hydrochlorothiazide (ZESTORETIC) 20-12.5 MG tablet Take 1 tablet by mouth daily. 11/15/20   [place], [name], MD  ?methylPREDNISolone (MEDROL DOSEPAK) 4 MG TBPK tablet Use as instructed 05/10/21   Curt Bears, MD  ?mirtazapine (REMERON) 15 MG tablet Take 2 tablets (30 mg total) by mouth at bedtime. 05/10/21   Curt Bears, MD   ?nystatin (MYCOSTATIN) 100000 UNIT/ML suspension Take 5 mLs (500,000 Units total) by mouth 4 (four) times daily. 02/01/21   Heilingoetter, Cassandra L, PA-C  ?potassium chloride SA (KLOR-CON M) 20 MEQ tablet Take 1 tablet (20 mEq total) by mouth 2 (two) times daily. 05/10/21   Curt Bears, MD  ?   ? ?Allergies    ?Patient has no known allergies.   ? ?Review of Systems   ?Review of Systems  ?Constitutional:   ?     Per HPI, otherwise negative  ?HENT:    ?     Per HPI, otherwise negative  ?Respiratory:    ?     Per HPI, otherwise negative  ?Cardiovascular:   ?     Per HPI, otherwise negative  ?Gastrointestinal:  Negative for vomiting.  ?Endocrine:  ?     Negative aside from HPI  ?Genitourinary:   ?     Neg aside from HPI   ?Musculoskeletal:   ?     Per HPI, otherwise negative  ?Skin:  Positive for wound.  ?Allergic/Immunologic: Positive for immunocompromised state.  ?Neurological:  Positive for weakness. Negative for syncope.  ? ?Physical Exam ?Updated Vital Signs ?BP 101/72 (BP Location: Right Arm)   Pulse (!) 114   Temp 98.8 ?F (37.1 ?C) (Oral)   Resp (!) 26   SpO2 100%  ?Physical Exam ?Vitals and nursing note reviewed.  ?Constitutional:   ?   General: He is not in acute distress. ?   Appearance: He is  well-developed. He is ill-appearing. He is not toxic-appearing.  ?HENT:  ?   Head: Normocephalic and atraumatic.  ?Eyes:  ?   Conjunctiva/sclera: Conjunctivae normal.  ?Cardiovascular:  ?   Rate and Rhythm: Regular rhythm. Tachycardia present.  ?Pulmonary:  ?   Effort: Pulmonary effort is normal. No respiratory distress.  ?   Breath sounds: No stridor.  ?Abdominal:  ?   General: There is no distension.  ?Skin: ?   General: Skin is warm and dry.  ? ?    ?Neurological:  ?   Mental Status: He is alert and oriented to person, place, and time.  ? ? ?ED Results / Procedures / Treatments   ?Labs ?(all labs ordered are listed, but only abnormal results are displayed) ?Labs Reviewed  ?CULTURE, BLOOD (ROUTINE X 2)   ?CULTURE, BLOOD (ROUTINE X 2)  ?COMPREHENSIVE METABOLIC PANEL  ?LACTIC ACID, PLASMA  ?LACTIC ACID, PLASMA  ?CBC WITH DIFFERENTIAL/PLATELET  ?PROTIME-INR  ?URINALYSIS, ROUTINE W REFLEX MICROSCOPIC  ? ? ?EKG ?None ? ?Radiology ?No results found. ? ?Procedures ?Marland Kitchen.Incision and Drainage ? ?Date/Time: 05/17/2021 2:00 PM ?Performed by: Carmin Muskrat, MD ?Authorized by: Carmin Muskrat, MD  ? ?Consent:  ?  Consent obtained:  Verbal ?  Consent given by:  Patient ?  Risks, benefits, and alternatives were discussed: yes   ?  Risks discussed:  Incomplete drainage, pain and infection ?  Alternatives discussed:  Alternative treatment ?Universal protocol:  ?  Procedure explained and questions answered to patient or proxy's satisfaction: yes   ?  Relevant documents present and verified: yes   ?  Test results available : yes   ?  Imaging studies available: yes   ?  Required blood products, implants, devices, and special equipment available: yes   ?  Site/side marked: yes   ?  Immediately prior to procedure, a time out was called: yes   ?  Patient identity confirmed:  Verbally with patient ?Location:  ?  Type:  Abscess ?  Size:  15 ?  Location: one lower L, one upper L. ?Pre-procedure details:  ?  Skin preparation:  Chlorhexidine with alcohol ?Sedation:  ?  Sedation type:  None ?Anesthesia:  ?  Anesthesia method:  Local infiltration ?  Local anesthetic:  Lidocaine 1% w/o epi ?Procedure type:  ?  Complexity:  Complex ?Procedure details:  ?  Ultrasound guidance: no   ?  Needle aspiration: no   ?  Incision types:  Single with marsupialization ?  Incision depth:  Subcutaneous ?  Wound management:  Probed and deloculated, irrigated with saline and extensive cleaning ?  Drainage:  Purulent ?  Drainage amount:  Copious (123ml) ?  Wound treatment:  Drain placed ?  Packing materials:  1/2 in iodoform gauze ?  Amount 1/2" iodoform:  15 ?Post-procedure details:  ?  Procedure completion:  Tolerated well, no immediate complications ?Comments:   ?   Patient had incision and drainage of left lower extremity, left upper extremity wounds.  Left lower extremity without complication.  Left upper extremity with 2 wounds, with communication between the 2 opened, with copious amounts of drainage, packing placed into each.  ? ? ?Medications Ordered in ED ?Medications - No data to display ? ?ED Course/ Medical Decision Making/ A&P ?This patient with a Hx of non-small cell lung cancer, chemotherapy last week presents to the ED for concern of hypotension, weakness, skin changes, and fall, this involves an extensive number of treatment options, and is a complaint that carries with  it a high risk of complications and morbidity.   ? ?The differential diagnosis includes sepsis, abscess, weakness, neutropenia ? ? ?Social Determinants of Health: ? ?Age, ongoing cancer therapy ? ?Additional history obtained: ? ?Additional history and/or information obtained from sister, chemotherapy nurse, notable for sister for HPI, oncology nurse brings the patient here from cancer center due to concerns as above ? ? ?After the initial evaluation, orders, including: Labs empiric antibiotics, fluid resuscitation per sepsis protocol were initiated. ? ? ?Patient placed on Cardiac and Pulse-Oximetry Monitors. ?The patient was maintained on a cardiac monitor.  The cardiac monitored showed an rhythm of 121 sinus tach abnormal ?The patient was also maintained on pulse oximetry. The readings were typically 99% room air normal ? ? ?On repeat evaluation of the patient improved ? ?Lab Tests: ? ?I personally interpreted labs.  The pertinent results include: Neutropenia, absolute neutrophil count 0 acute kidney injury near doubling of his creatinine over the week. ? ?Imaging Studies ordered: ? ?I independently visualized and interpreted imaging which showed no pneumonia ?I agree with the radiologist interpretation ? ?Consultations Obtained: ? ?I requested consultation with the internal medicine,  and  discussed lab and imaging findings as well as pertinent plan - they recommend: Admission ? ? ?Dispostion / Final MDM: ? ?After consideration of the diagnostic results and the patient's response to treatment, patie

## 2021-05-17 NOTE — ED Triage Notes (Signed)
Pt brough in from cancer center, hx of lung cancer. Upon arrival to cancer center, pt sustained a fall to right hip. Recent fall with injury to left hip and left knee. Both noted with open areas.  ? ?BP 91/65 ?P 120 ?RR 18 ?spO2 100% RA ?T 98.2 ?

## 2021-05-17 NOTE — Progress Notes (Signed)
Pharmacy Antibiotic Note ? ?Adam [name] is a 66 y.o. male admitted on 05/17/2021 with sepsis secondary to L forarm and Left knee abscesses/cellulitis.  Pharmacy has been consulted for Vancomycin dosing.  Ceftriaxone per MD.  ? ?Plan: ?Vancomycin 1500 mg IV x1 then 1250 mg IV q24h.  (SCr 1.53, est AUC 526) ?Measure Vanc levels as needed.  Goal AUC = 400 - 550.  ?Follow up renal function, culture results, and clinical course. ? ? ?  ? ?Temp (24hrs), Avg:98.5 ?F (36.9 ?C), Min:98.2 ?F (36.8 ?C), Max:98.8 ?F (37.1 ?C) ? ?Recent Labs  ?Lab 05/17/21 ?1142  ?WBC 0.3*  ?CREATININE 1.53*  ?LATICACIDVEN 1.9  ?  ?Estimated Creatinine Clearance: 48.8 mL/min (A) (by C-G formula based on SCr of 1.53 mg/dL (H)).   ? ?No Known Allergies ? ?Antimicrobials this admission: ?5/10 Ceftriaxone >>  ?5/10 Vancomycin >>  ? ?Dose adjustments this admission: ? ? ?Microbiology results: ?5/10 BCx2:  ? ?Thank you for allowing pharmacy to be a part of this patient?s care. ? ?Gretta Arab PharmD, BCPS ?Clinical Pharmacist ?[facility] [phone] ?05/17/2021 4:24 PM ? ? ?

## 2021-05-17 NOTE — Progress Notes (Signed)
Patient arrived in room 1443.  Patient denies pain, nausea, discomfort.  Telemetry started.  Patient voided and blood was noticed to be on fabric of external catheter afterwards.  MD notified. ? ?Angie Fava, RN  ?

## 2021-05-17 NOTE — Progress Notes (Signed)
? ? ? ?Symptom Management Consult note ?[place]   ? ?Patient Care Team: ?[place], Blondell Reveal, MD as PCP - General (Family Medicine) ?[name], RN (Inactive) as Registered Nurse  ? ? ?Name of the patient: Adam [name]  [id number]  09-06-1955  ? ?Date of visit: 05/17/2021  ? ? ?Chief complaint/ Reason for visit- fall ? ?Oncology History Overview Note  ?He is currently on the following trial. Please refer to my original note for complete history. ? 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 ?C1 D1 02/16/2020-02/18/2020 ?He has consented for the trial and is enrolled to the arm without immunotherapy. ?C2 D1-D3 03/15/2020-03/17/2020 ?C3D1 completed 04/05/2020 through 04/07/2020 ?C4D1 delayed by a week because of neutropenia. ?C4D1 completed 4/26-22-05/05/2020 ?  ?Primary cancer of right upper lobe of lung ([place])  ?02/06/2020 Initial Diagnosis  ? Primary cancer of right upper lobe of lung ([place]) ? ?  ?02/10/2020 Cancer Staging  ? Staging form: Lung, AJCC 8th Edition ?- Pathologic stage from 02/10/2020: No Stage Recommended (cT1c, cN2, cM0) - Signed by Benay Pike, MD on 02/10/2020 ?Histopathologic type: Small cell carcinoma, NOS ?Stage prefix: Initial diagnosis ?Histologic grade (G): GX ?Histologic grading system: 4 grade system ? ?  ?Small cell lung cancer, 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[place])  ?02/10/2020 Initial Diagnosis  ? Small cell lung cancer in adult [facility]) ? ?  ?02/16/2020 - 02/18/2020 Chemotherapy  ?  ? ?  ? ?  ?03/15/2020 - 05/05/2020 Chemotherapy  ? Patient is on Treatment Plan : LUNG SMALL CELL - LIMITED STAGE RESEARCH [id number] CISPLATIN D1 / ETOPOSIDE D1-3 ARM 1  ? ?  ?  ?12/14/2020 - 03/15/2021 Chemotherapy  ? Patient is on Treatment Plan : LUNG SMALL CELL EXTENSIVE STAGE Durvalumab + Carboplatin D1 + Etoposide D1-3 q21d x 4 Cycles / Durvalumab q28d  ? ?  ?  ?04/19/2021 -  Chemotherapy  ? Patient is on  Treatment Plan : LUNG SMALL CELL Lurbinectedin q21d  ? ?  ?  ? ? ?Current Therapy: lurbinectedin day 1 cycle 2 on 05/10/21 ? ?Interval history- Adam [name] is a 66 yo male with oncologic history as above presenting to [facility] today with chief complaint of fall.  Patient had 3 mechanical falls over the last week and then fell in the lobby this morning.  He reports for his falls last week he landed on his left side and obtained injuries to his left elbow and knee.  He was able to get up without assistance and has been ambulatory since those falls.  He does live alone.  His ex-wife came over last night to dress his wounds.  Patient also reports that he had a temperature of 100.1 and took Tylenol at 10 PM last night.  He states this morning he was in our lobby and had just use the restroom when he lost his balance and fell landing on his right hip.  He denies being in any pain right now.  He denies hitting his head or any loss of consciousness with all of these falls.  He is not on any blood thinners.  He states his left elbow and knee are swollen and have had some purulent drainage.  Denies any chills, cough, headache, visual changes, abdominal pain, nausea, urinary symptoms, diarrhea numbness, tingling, weakness. Additional hisory obtained from sister at the bedside. ? ? ? ? ?ROS  ?All other systems are reviewed and are  negative for acute change except as noted in the HPI. ? ? ? ?No Known Allergies ? ? ?Past Medical History:  ?Diagnosis Date  ? Hypertension   ? Lung cancer ([place])   ? small cell RUL  ? ? ? ?Past Surgical History:  ?Procedure Laterality Date  ? BRONCHIAL BRUSHINGS  02/08/2020  ? Procedure: BRONCHIAL BRUSHINGS;  Surgeon: Freddi Starr, MD;  Location: [facility] ENDOSCOPY;  Service: Pulmonary;;  ? BRONCHIAL NEEDLE ASPIRATION BIOPSY  02/08/2020  ? Procedure: BRONCHIAL NEEDLE ASPIRATION BIOPSIES;  Surgeon: Freddi Starr, MD;  Location: [facility] ENDOSCOPY;  Service: Pulmonary;;  ? IR IMAGING GUIDED PORT INSERTION   12/30/2020  ? KNEE SURGERY Right   ? SHOULDER SURGERY Left   ? VIDEO BRONCHOSCOPY WITH ENDOBRONCHIAL ULTRASOUND N/A 02/08/2020  ? Procedure: VIDEO BRONCHOSCOPY WITH ENDOBRONCHIAL ULTRASOUND;  Surgeon: Freddi Starr, MD;  Location: [facility] ENDOSCOPY;  Service: Pulmonary;  Laterality: N/A;  ? ? ?Social History  ? ?Socioeconomic History  ? Marital status: Divorced  ?  Spouse name: [name]  ? Number of children: Not on file  ? Years of education: Not on file  ? Highest education level: Not on file  ?Occupational History  ? Not on file  ?Tobacco Use  ? Smoking status: Former  ?  Packs/day: 1.00  ?  Years: 20.00  ?  Pack years: 20.00  ?  Types: Cigarettes  ?  Quit date: 06/09/2018  ?  Years since quitting: 2.9  ? Smokeless tobacco: Never  ?Vaping Use  ? Vaping Use: Never used  ?Substance and Sexual Activity  ? Alcohol use: Not Currently  ? Drug use: Never  ? Sexual activity: Yes  ?Other Topics Concern  ? Not on file  ?Social History Narrative  ? Not on file  ? ?Social Determinants of Health  ? ?Financial Resource Strain: Not on file  ?Food Insecurity: Not on file  ?Transportation Needs: Not on file  ?Physical Activity: Not on file  ?Stress: Not on file  ?Social Connections: Not on file  ?Intimate Partner Violence: Not on file  ? ? ?Family History  ?Problem Relation Age of Onset  ? Heart disease Mother   ? Cancer Cousin   ? Breast cancer Neg Hx   ? Colon cancer Neg Hx   ? Pancreatic cancer Neg Hx   ? Prostate cancer Neg Hx   ? ? ? ?Current Outpatient Medications:  ?  acetaminophen (TYLENOL) 325 MG tablet, Take 650 mg by mouth every 6 (six) hours as needed., Disp: , Rfl:  ?  amLODipine (NORVASC) 10 MG tablet, Take 10 mg by mouth daily., Disp: , Rfl:  ?  bisacodyl (DULCOLAX) 5 MG EC tablet, Take 5 mg by mouth daily as needed for moderate constipation., Disp: , Rfl:  ?  ibuprofen (ADVIL) 600 MG tablet, Take 600 mg by mouth every 6 (six) hours as needed., Disp: , Rfl:  ?  lidocaine-prilocaine (EMLA) cream, Apply 1 application  topically as needed., Disp: 30 g, Rfl: 2 ?  lisinopril-hydrochlorothiazide (ZESTORETIC) 20-12.5 MG tablet, Take 1 tablet by mouth daily., Disp: 90 tablet, Rfl: 1 ?  methylPREDNISolone (MEDROL DOSEPAK) 4 MG TBPK tablet, Use as instructed, Disp: 21 tablet, Rfl: 0 ?  mirtazapine (REMERON) 15 MG tablet, Take 2 tablets (30 mg total) by mouth at bedtime., Disp: 30 tablet, Rfl: 2 ?  nystatin (MYCOSTATIN) 100000 UNIT/ML suspension, Take 5 mLs (500,000 Units total) by mouth 4 (four) times daily., Disp: 200 mL, Rfl: 0 ?  potassium chloride SA (KLOR-CON  M) 20 MEQ tablet, Take 1 tablet (20 mEq total) by mouth 2 (two) times daily., Disp: 14 tablet, Rfl: 0 ? ?PHYSICAL EXAM: ?ECOG FS:2 - Symptomatic, <50% confined to bed ? ?  ?Vitals:  ? 05/17/21 1015  ?BP: 91/65  ?Pulse: (!) 120  ?Resp: (!) 22  ?Temp: 98.2 ?F (36.8 ?C)  ?SpO2: 100%  ? ?Physical Exam ?Vitals and nursing note reviewed.  ?Constitutional:   ?   Appearance: He is well-developed.  ?   Comments: Chronically ill appearing male. Thin  ?HENT:  ?   Head: Normocephalic and atraumatic.  ?   Comments: No tenderness to palpation of skull. No deformities or crepitus noted. No open wounds, abrasions or lacerations. ? ? ?   Nose: Nose normal.  ?Eyes:  ?   General: No scleral icterus.    ?   Right eye: No discharge.     ?   Left eye: No discharge.  ?   Conjunctiva/sclera: Conjunctivae normal.  ?Neck:  ?   Vascular: No JVD.  ?Cardiovascular:  ?   Rate and Rhythm: Regular rhythm. Tachycardia present.  ?   Pulses: Normal pulses.     ?     Radial pulses are 2+ on the right side and 2+ on the left side.  ?   Heart sounds: Normal heart sounds.  ?Pulmonary:  ?   Breath sounds: Normal breath sounds.  ?Abdominal:  ?   General: There is no distension.  ?Musculoskeletal:     ?   General: Normal range of motion.  ?   Cervical back: Normal range of motion.  ?   Comments: Swelling of left elbow and left lower extremity.  Please see media below.  Areas are warm to the touch.  No purulent  drainage.  He has full range of motion of elbow and knee although appears to be in pain when moving.  Extremities are neurovascularly intact. ? ? ?He is able to ambulate with assistance and bear weight on lower extremitie

## 2021-05-17 NOTE — Progress Notes (Signed)
A consult was received from an ED physician for vancomycin per pharmacy dosing.  The patient's profile has been reviewed for ht/wt/allergies/indication/available labs.   ?A one time order has been placed for vancomycin 1500 mg.  ? ? Further antibiotics/pharmacy consults should be ordered by admitting physician if indicated.       ?                ?Thank you, ?Eudelia Bunch, Pharm.D ?05/17/2021 12:47 PM ? ?

## 2021-05-18 ENCOUNTER — Encounter: Payer: Self-pay | Admitting: Emergency Medicine

## 2021-05-18 DIAGNOSIS — D701 Agranulocytosis secondary to cancer chemotherapy: Secondary | ICD-10-CM

## 2021-05-18 DIAGNOSIS — D61818 Other pancytopenia: Secondary | ICD-10-CM

## 2021-05-18 DIAGNOSIS — L03116 Cellulitis of left lower limb: Secondary | ICD-10-CM | POA: Diagnosis not present

## 2021-05-18 DIAGNOSIS — T451X5A Adverse effect of antineoplastic and immunosuppressive drugs, initial encounter: Secondary | ICD-10-CM

## 2021-05-18 DIAGNOSIS — R7881 Bacteremia: Secondary | ICD-10-CM | POA: Insufficient documentation

## 2021-05-18 DIAGNOSIS — C3411 Malignant neoplasm of upper lobe, right bronchus or lung: Secondary | ICD-10-CM | POA: Diagnosis not present

## 2021-05-18 DIAGNOSIS — L03114 Cellulitis of left upper limb: Secondary | ICD-10-CM

## 2021-05-18 DIAGNOSIS — B9620 Unspecified Escherichia coli [E. coli] as the cause of diseases classified elsewhere: Secondary | ICD-10-CM

## 2021-05-18 DIAGNOSIS — A419 Sepsis, unspecified organism: Secondary | ICD-10-CM | POA: Diagnosis not present

## 2021-05-18 LAB — DIFFERENTIAL
Abs Immature Granulocytes: 0 10*3/uL (ref 0.00–0.07)
Basophils Absolute: 0 10*3/uL (ref 0.0–0.1)
Basophils Relative: 0 %
Eosinophils Absolute: 0 10*3/uL (ref 0.0–0.5)
Eosinophils Relative: 0 %
Immature Granulocytes: 0 %
Lymphocytes Relative: 81 %
Lymphs Abs: 0.2 10*3/uL — ABNORMAL LOW (ref 0.7–4.0)
Monocytes Absolute: 0 10*3/uL — ABNORMAL LOW (ref 0.1–1.0)
Monocytes Relative: 5 %
Neutro Abs: 0 10*3/uL — CL (ref 1.7–7.7)
Neutrophils Relative %: 14 %

## 2021-05-18 LAB — CBC
HCT: 26.4 % — ABNORMAL LOW (ref 39.0–52.0)
Hemoglobin: 8.5 g/dL — ABNORMAL LOW (ref 13.0–17.0)
MCH: 27.6 pg (ref 26.0–34.0)
MCHC: 32.2 g/dL (ref 30.0–36.0)
MCV: 85.7 fL (ref 80.0–100.0)
Platelets: 33 10*3/uL — ABNORMAL LOW (ref 150–400)
RBC: 3.08 MIL/uL — ABNORMAL LOW (ref 4.22–5.81)
RDW: 15.5 % (ref 11.5–15.5)
WBC: 0.2 10*3/uL — CL (ref 4.0–10.5)
nRBC: 0 % (ref 0.0–0.2)

## 2021-05-18 LAB — BLOOD CULTURE ID PANEL (REFLEXED) - BCID2

## 2021-05-18 LAB — COMPREHENSIVE METABOLIC PANEL
ALT: 26 U/L (ref 0–44)
AST: 23 U/L (ref 15–41)
Albumin: 2.2 g/dL — ABNORMAL LOW (ref 3.5–5.0)
Alkaline Phosphatase: 44 U/L (ref 38–126)
Anion gap: 7 (ref 5–15)
BUN: 18 mg/dL (ref 8–23)
CO2: 25 mmol/L (ref 22–32)
Calcium: 8.5 mg/dL — ABNORMAL LOW (ref 8.9–10.3)
Chloride: 107 mmol/L (ref 98–111)
Creatinine, Ser: 0.89 mg/dL (ref 0.61–1.24)
GFR, Estimated: >60 mL/min (ref >60)
Glucose, Bld: 104 mg/dL — ABNORMAL HIGH (ref 70–99)
Potassium: 3.8 mmol/L (ref 3.5–5.1)
Sodium: 139 mmol/L (ref 135–145)
Total Bilirubin: 1 mg/dL (ref 0.3–1.2)
Total Protein: 6.3 g/dL — ABNORMAL LOW (ref 6.5–8.1)

## 2021-05-18 LAB — PROTIME-INR
INR: 1.2 (ref 0.8–1.2)
Prothrombin Time: 14.9 seconds (ref 11.4–15.2)

## 2021-05-18 LAB — HIV ANTIBODY (ROUTINE TESTING W REFLEX): HIV Screen 4th Generation wRfx: NONREACTIVE

## 2021-05-18 LAB — CORTISOL-AM, BLOOD: Cortisol - AM: 21.5 ug/dL (ref 6.7–22.6)

## 2021-05-18 LAB — PROCALCITONIN: Procalcitonin: 3.27 ng/mL

## 2021-05-18 MED ORDER — TBO-FILGRASTIM 480 MCG/0.8ML ~~LOC~~ SOSY
480.0000 ug | PREFILLED_SYRINGE | Freq: Every day | SUBCUTANEOUS | Status: DC
  Administered 2021-05-18 – 2021-05-26 (×9): 480 ug via SUBCUTANEOUS
  Filled 2021-05-18 (×9): qty 0.8

## 2021-05-18 NOTE — Assessment & Plan Note (Addendum)
Resolved

## 2021-05-18 NOTE — Evaluation (Signed)
Occupational Therapy Evaluation ?Patient Details ?Name: Adam [name] ?MRN: [id number] ?DOB: 1955/08/29 ?Today's Date: 05/18/2021 ? ? ?History of Present Illness Adam [name] is a 66 y.o. male with medical history significant of metastatic small cell lung cancer, HTN. Presenting with multiple falls over the last week. Today presented to his [facility] clinic for regular follow up and with concerns of left knee and left forearm after falls at home.  ? ?Clinical Impression ?  ?Adam [name]  is a 66 year old man with above medical history. On evaluation he presents with impaired balance, generalized weakness, and decreased activity tolerance. He had been mostly independent and living alone until recently. He has had multiple falls at home. Today he required min guard for ambulation and assistance for ADLs. Patient reports he is going to stay with his ex-wife at discharge. Recommend BSC and [place] services at discharge as long as he has more assistance at home.  ?   ? ?Recommendations for follow up therapy are one component of a multi-disciplinary discharge planning process, led by the attending physician.  Recommendations may be updated based on patient status, additional functional criteria and insurance authorization.  ? ?Follow Up Recommendations ? Home health OT  ?  ?Assistance Recommended at Discharge Frequent or constant Supervision/Assistance  ?Patient can return home with the following A little help with walking and/or transfers;A little help with bathing/dressing/bathroom;Assistance with cooking/housework;Direct supervision/assist for financial management;Direct supervision/assist for medications management;Assist for transportation;Help with stairs or ramp for entrance ? ?  ?Functional Status Assessment ? Patient has had a recent decline in their functional status and demonstrates the ability to make significant improvements in function in a reasonable and predictable amount of time.  ?Equipment Recommendations ? BSC/3in1   ?  ?Recommendations for Other Services   ? ? ?  ?Precautions / Restrictions Precautions ?Precautions: Fall ?Precaution Comments: multiple falls, LUE and LLE wound ?Restrictions ?Weight Bearing Restrictions: No  ? ?  ? ?Mobility Bed Mobility ?Overal bed mobility: Needs Assistance ?Bed Mobility: Supine to Sit ?  ?  ?Supine to sit: Supervision, HOB elevated ?  ?  ?  ?  ? ?Transfers ?Overall transfer level: Needs assistance ?Equipment used: Rolling walker (2 wheels) ?  ?  ?  ?  ?  ?  ?  ?General transfer comment: Min guard for ambulation with walker and without. Does better with walker. ?  ? ?  ?Balance Overall balance assessment: Mild deficits observed, not formally tested ?  ?  ?  ?  ?  ?  ?  ?  ?  ?  ?  ?  ?  ?  ?  ?  ?  ?  ?   ? ?ADL either performed or assessed with clinical judgement  ? ?ADL Overall ADL's : Needs assistance/impaired ?Eating/Feeding: Independent ?  ?Grooming: Standing;Wash/dry hands ?  ?Upper Body Bathing: Set up;Sitting ?  ?Lower Body Bathing: Minimal assistance;Sit to/from stand ?  ?Upper Body Dressing : Set up;Sitting ?  ?Lower Body Dressing: Minimal assistance;Sit to/from stand ?  ?Toilet Transfer: Min guard;Rolling walker (2 wheels);Regular Toilet;Grab bars;Cueing for safety ?  ?Toileting- Clothing Manipulation and Hygiene: Moderate assistance;Sit to/from stand ?Toileting - Clothing Manipulation Details (indicate cue type and reason): limited by lines/leads/hospital gown and then episode of explosive diarrhea requring extensive clean up ?  ?  ?Functional mobility during ADLs: Min guard;Rolling walker (2 wheels) ?General ADL Comments: min guard to ambualte with walker and without. does better with walker.  ? ? ? ?Vision  Patient Visual Report: No change from baseline ?Vision Assessment?: No apparent visual deficits  ?   ?Perception   ?  ?Praxis   ?  ? ?Pertinent Vitals/Pain Pain Assessment ?Pain Assessment: Faces ?Faces Pain Scale: Hurts a little bit ?Pain Location: wounds ?Pain Descriptors /  Indicators: Discomfort, Grimacing ?Pain Intervention(s): Limited activity within patient's tolerance  ? ? ? ?Hand Dominance Right ?  ?Extremity/Trunk Assessment Upper Extremity Assessment ?Upper Extremity Assessment: RUE deficits/detail;LUE deficits/detail ?RUE Deficits / Details: WFL ROM, 5/5 strength ?RUE Sensation: WNL ?RUE Coordination: WNL ?LUE Deficits / Details: WFL ROM, strength 5/5 despite wound ?LUE Sensation: WNL ?LUE Coordination: WNL ?  ?Lower Extremity Assessment ?Lower Extremity Assessment: Defer to PT evaluation ?  ?Cervical / Trunk Assessment ?Cervical / Trunk Assessment: Normal ?  ?Communication Communication ?Communication: No difficulties ?  ?Cognition Arousal/Alertness: Awake/alert ?Behavior During Therapy: Flat affect ?Overall Cognitive Status: Within Functional Limits for tasks assessed ?  ?  ?  ?  ?  ?  ?  ?  ?  ?  ?  ?  ?  ?  ?  ?  ?General Comments: Mostly functional. Alert to self, year, President. states it is march. [place] when asked where he is. Able to follow commands. Has a flat affect. Hx of brain mets. ?  ?  ?General Comments    ? ?  ?Exercises   ?  ?Shoulder Instructions    ? ? ?Home Living Family/patient expects to be discharged to:: Unsure ?Living Arrangements: Alone ?  ?  ?  ?  ?  ?  ?  ?  ?  ?  ?  ?  ?  ?  ?  ?Additional Comments: Typically lives alone, reports he's going to stay at his ex's house. 1 step. walk in shower ?  ? ?  ?Prior Functioning/Environment Prior Level of Function : Independent/Modified Independent ?  ?  ?  ?  ?  ?  ?Mobility Comments: uses cane/walker as needed - been falling. ?ADLs Comments: independent till recently, tub/shower at his house ?  ? ?  ?  ?OT Problem List: Decreased activity tolerance;Impaired balance (sitting and/or standing);Decreased cognition;Decreased safety awareness;Decreased knowledge of use of DME or AE;Impaired UE functional use;Pain ?  ?   ?OT Treatment/Interventions: Self-care/ADL training;DME and/or AE  instruction;Therapeutic activities;Cognitive remediation/compensation;Patient/family education;Balance training  ?  ?OT Goals(Current goals can be found in the care plan section) Acute Rehab OT Goals ?Patient Stated Goal: less falls ?OT Goal Formulation: With patient ?Time For Goal Achievement: 06/01/21 ?Potential to Achieve Goals: Good  ?OT Frequency: Min 2X/week ?  ? ?Co-evaluation   ?  ?  ?  ?  ? ?  ?AM-PAC OT "6 Clicks" Daily Activity     ?Outcome Measure Help from another person eating meals?: None ?Help from another person taking care of personal grooming?: A Little ?Help from another person toileting, which includes using toliet, bedpan, or urinal?: A Lot ?Help from another person bathing (including washing, rinsing, drying)?: A Little ?Help from another person to put on and taking off regular upper body clothing?: A Little ?Help from another person to put on and taking off regular lower body clothing?: A Little ?6 Click Score: 18 ?  ?End of Session Equipment Utilized During Treatment: Rolling walker (2 wheels) ?Nurse Communication: Mobility status ? ?Activity Tolerance: Patient tolerated treatment well ?Patient left: in chair;with call bell/phone within reach;with chair alarm set ? ?OT Visit Diagnosis: Unsteadiness on feet (R26.81)  ?              ?  Time: 8727-6184 ?OT Time Calculation (min): 31 min ?Charges:  OT General Charges ?$OT Visit: 1 Visit ?OT Evaluation ?$OT Eval Low Complexity: 1 Low ?OT Treatments ?$Self Care/Home Management : 8-22 mins ? ?Dandria Griego, OTR/L ?Acute Care Rehab Services  ?Office [phone] ?Pager: [phone]  ? ?Anali Cabanilla L Berania Peedin ?05/18/2021, 1:05 PM ?

## 2021-05-18 NOTE — Assessment & Plan Note (Deleted)
-   Blood cultures on admission growing E. Coli (? 2/3 bottles) - given immunosuppression would like to check repeat cultures for clearance  -Repeat cultures from 05/19/2021 negative at 48 hours. - Transition Rocephin to Ancef

## 2021-05-18 NOTE — Assessment & Plan Note (Deleted)
-   [place] 28 today ?- continue neutropenic precautions ?- continue vanc and rocephin; if any worsening, will need to broaden ?

## 2021-05-18 NOTE — Assessment & Plan Note (Deleted)
-   s/p debridement in ER with packing in place as well; same treatment as for left forearm, continue daily packing changes

## 2021-05-18 NOTE — Progress Notes (Signed)
?Progress Note ? ? ? ?Adam [name]   ?OJJ:[id number]  ?DOB: [birth year]  ?DOA: 05/17/2021     1 ?PCP: de [place], [name], MD ? ?Initial CC: left forearm wound and left knee wound; falls ? ?Hospital Course: ?Adam [name] is a 66 y.o. male with PMH metastatic small cell lung cancer, HTN. Presented with multiple falls over the last week.  ?He had presented to the cancer center clinic for follow-up and was having swelling in his left knee and left forearm near his elbow.  He was referred to the ER from the cancer center for further evaluation.  In addition to his falls he has also appeared to be weaker per family on admission.  He underwent bedside debridement in the ER of his forearm and knee swelling with packing placed afterwards.  He was started on IV antibiotics.  After admission he was also found to be bacteremic with blood cultures growing E. coli. ?General surgery and orthopedic surgery were also consulted on admission for further evaluation of his underlying abscesses. ? ?Interval History:  ?No events overnight. Patient lethargic this morning but notes still pain at his I&D sites.  ? ?Assessment and Plan: ?* Sepsis ([place]) ?- Tachycardia, tachypnea, neutropenia, lactic acidosis; source presumed skin abscesses and bacteremia still of unknown origin ?- see bacteremia and cellulitis  ? ?E coli bacteremia ?- Blood cultures on admission growing E. Coli (? 2/3 bottles) ?- given immunosuppression would like to check repeat cultures for clearance however will await plan regarding abscess management then repeat cultures  ?- continue rocephin  ? ?Cellulitis of left knee ?- s/p debridement in ER with packing in place as well.  Orthopedic surgery recommended to be consulted per surgery given proximity to knee.  Consult has been placed ?- Continue vancomycin and Rocephin as well ?- Follow-up further recs from orthopedic surgery ? ?Cellulitis of left forearm ?- s/p debridement in ER with packing in place; still having ongoing  drainage since debridement with concern for deeper infection still ?- Surgery consulted to help further evaluate in case of need for further debridement ?-Continue vancomycin and Rocephin ? ?Chemotherapy-induced neutropenia (HCC) ?- ANC 28 today ?- continue neutropenic precautions ?- continue vanc and rocephin; if any worsening, will need to broaden ? ?AKI (acute kidney injury) (HCC)-resolved as of 05/18/2021 ?- baseline creatinine ~ 0.8 ?- patient presents with increase in creat >0.3 mg/dL above baseline, creat increase >1.5x baseline presumed to have occurred within past 7 days PTA ?-Creatinine 1.53 on admission ?- Presumed prerenal from acute illness and poor intake ?- S/p volume resuscitation ?- Renal function improved this morning ? ? ?HTN (hypertension) ?- Currently controlled without medication ? ? ?Old records reviewed in assessment of this patient ? ?Antimicrobials: ?Rocephin 5/10 >> current ?Vanc 5/10 >> current ? ?DVT prophylaxis:  ?SCDs Start: 05/17/21 1848 ? ? ?Code Status:   Code Status: Full Code ? ?Disposition Plan:  Pending PT/OT evals ?Status is: Inpt ? ?Objective: ?Blood pressure 106/76, pulse (!) 108, temperature 98.2 ?F (36.8 ?C), temperature source Oral, resp. rate (!) 22, SpO2 100 %.  ?Examination:  ?Physical Exam ?Constitutional:   ?   Comments: Fatigued appearing but awake and alert.  No acute distress  ?HENT:  ?   Head: Normocephalic and atraumatic.  ?   Mouth/Throat:  ?   Mouth: Mucous membranes are moist.  ?Eyes:  ?   Extraocular Movements: Extraocular movements intact.  ?Cardiovascular:  ?   Rate and Rhythm: Normal rate and regular rhythm.  ?  Heart sounds: Normal heart sounds.  ?Pulmonary:  ?   Effort: Pulmonary effort is normal. No respiratory distress.  ?   Breath sounds: Normal breath sounds. No wheezing.  ?Abdominal:  ?   General: Bowel sounds are normal. There is no distension.  ?   Palpations: Abdomen is soft.  ?   Tenderness: There is no abdominal tenderness.  ?Musculoskeletal:   ?   Cervical back: Normal range of motion and neck supple.  ?   Comments: Wound appreciated on left proximal forearm with packing in place and tender to palpation around debridement site with pitting edema and indurated skin.  Same appearance involving left lateral proximal leg near the knee with packing in place and surrounding tenderness to palpation  ?Skin: ?   General: Skin is warm and dry.  ?Neurological:  ?   Mental Status: He is disoriented.  ?   Motor: Weakness present.  ?Psychiatric:     ?   Mood and Affect: Mood normal.  ?  ? ?Consultants:  ?General surgery ?Orthopedic surgery ? ?Procedures:  ? ? ?Data Reviewed: ?Results for orders placed or performed during the hospital encounter of 05/17/21 (from the past 24 hour(s))  ?Lactic acid, plasma     Status: Abnormal  ? Collection Time: 05/17/21  6:59 PM  ?Result Value Ref Range  ? Lactic Acid, Venous 2.7 (HH) 0.5 - 1.9 mmol/L  ?Culture, blood (Routine x 2)     Status: None (Preliminary result)  ? Collection Time: 05/17/21  6:59 PM  ? Specimen: BLOOD  ?Result Value Ref Range  ? Specimen Description    ?  BLOOD ?Performed at [facility], [place] [address], [place], [place] [address] ?  ? Special Requests    ?  IN PEDIATRIC BOTTLE Blood Culture adequate volume ?Performed at [facility], [place] [address], [name], [place] [address] ?  ? Culture    ?  NO GROWTH < 12 HOURS ?Performed at [facility], [place] [address] [place], [place] [address] ?  ? Report Status PENDING   ?Differential     Status: Abnormal  ? Collection Time: 05/18/21  4:54 AM  ?Result Value Ref Range  ? Neutrophils Relative % 14 %  ? Neutro Abs 0.0 (LL) 1.7 - 7.7 K/uL  ? Lymphocytes Relative 81 %  ? Lymphs Abs 0.2 (L) 0.7 - 4.0 K/uL  ? Monocytes Relative 5 %  ? Monocytes Absolute 0.0 (L) 0.1 - 1.0 K/uL  ? Eosinophils Relative 0 %  ? Eosinophils Absolute 0.0 0.0 - 0.5 K/uL  ? Basophils Relative 0 %  ? Basophils Absolute 0.0 0.0 - 0.1 K/uL  ? Immature  Granulocytes 0 %  ? Abs Immature Granulocytes 0.00 0.00 - 0.07 K/uL  ?HIV Antibody (routine testing w rflx)     Status: None  ? Collection Time: 05/18/21  5:01 AM  ?Result Value Ref Range  ? HIV Screen 4th Generation wRfx Non Reactive Non Reactive  ?Protime-INR     Status: None  ? Collection Time: 05/18/21  5:01 AM  ?Result Value Ref Range  ? Prothrombin Time 14.9 11.4 - 15.2 seconds  ? INR 1.2 0.8 - 1.2  ?Cortisol-am, blood     Status: None  ? Collection Time: 05/18/21  5:01 AM  ?Result Value Ref Range  ? Cortisol - AM 21.5 6.7 - 22.6 ug/dL  ?Procalcitonin     Status: None  ? Collection Time: 05/18/21  5:01 AM  ?Result Value Ref Range  ?  Procalcitonin 3.27 ng/mL  ?Comprehensive metabolic panel     Status: Abnormal  ? Collection Time: 05/18/21  5:01 AM  ?Result Value Ref Range  ? Sodium 139 135 - 145 mmol/L  ? Potassium 3.8 3.5 - 5.1 mmol/L  ? Chloride 107 98 - 111 mmol/L  ? CO2 25 22 - 32 mmol/L  ? Glucose, Bld 104 (H) 70 - 99 mg/dL  ? BUN 18 8 - 23 mg/dL  ? Creatinine, Ser 0.89 0.61 - 1.24 mg/dL  ? Calcium 8.5 (L) 8.9 - 10.3 mg/dL  ? Total Protein 6.3 (L) 6.5 - 8.1 g/dL  ? Albumin 2.2 (L) 3.5 - 5.0 g/dL  ? AST 23 15 - 41 U/L  ? ALT 26 0 - 44 U/L  ? Alkaline Phosphatase 44 38 - 126 U/L  ? Total Bilirubin 1.0 0.3 - 1.2 mg/dL  ? GFR, Estimated >60 >60 mL/min  ? Anion gap 7 5 - 15  ?CBC     Status: Abnormal  ? Collection Time: 05/18/21  5:01 AM  ?Result Value Ref Range  ? WBC 0.2 (LL) 4.0 - 10.5 K/uL  ? RBC 3.08 (L) 4.22 - 5.81 MIL/uL  ? Hemoglobin 8.5 (L) 13.0 - 17.0 g/dL  ? HCT 26.4 (L) 39.0 - 52.0 %  ? MCV 85.7 80.0 - 100.0 fL  ? MCH 27.6 26.0 - 34.0 pg  ? MCHC 32.2 30.0 - 36.0 g/dL  ? RDW 15.5 11.5 - 15.5 %  ? Platelets 33 (L) 150 - 400 K/uL  ? nRBC 0.0 0.0 - 0.2 %  ?  ?I have Reviewed nursing notes, Vitals, and Lab results since pt's last encounter. Pertinent lab results : see above ?I have ordered test including BMP, CBC, Mg ?I have reviewed the last note from staff over past 24 hours ?I have discussed pt's care  plan and test results with nursing staff, case manager ? ? LOS: 1 day  ? ?Dwyane Dee, MD ?[facility] ?05/18/2021, 2:13 PM ?

## 2021-05-18 NOTE — Progress Notes (Signed)
?   05/18/21 0556  ?Provider Notification  ?Provider Name/Title J. Olena Heckle  ?Date Provider Notified 05/18/21  ?Time Provider Notified [phone]  ?Method of Notification Page  ?Notification Reason Critical result  ?Test performed and critical result WBC 0.2  ?Date Critical Result Received 05/18/21  ?Time Critical Result Received [phone]  ?Provider response No new orders  ?Date of Provider Response 05/18/21  ?Time of Provider Response [phone]  ? ? ?

## 2021-05-18 NOTE — Progress Notes (Signed)
?   05/18/21 0829  ?Provider Notification  ?Provider Name/Title Dwyane Dee, MD  ?Date Provider Notified 05/18/21  ?Time Provider Notified [phone]  ?Method of Notification Page  ?Notification Reason Critical result  ?Test performed and critical result Absolute neutrophil 0  ?Date Critical Result Received 05/18/21  ?Time Critical Result Received 0829  ? ? ?

## 2021-05-18 NOTE — Assessment & Plan Note (Addendum)
Normotensive off home amlodipine, lisinopril and HCTZ.  So discontinued all.

## 2021-05-18 NOTE — Hospital Course (Addendum)
66 year old M with PMH of metastatic small cell lung cancer and HTN presenting from oncology center due to left knee and left forearm swelling, and fall at home.  He underwent bedside debridement in ER for his left forearm and knee swelling with packing on 5/20.  He was started on IV antibiotics.  After admission, he was found to have E. coli bacteremia.  Infectious disease and ID consulted.  He was reevaluated by orthopedic surgery on 5/18 and underwent I&D of left proximal forearm wound.  Wound culture grew E. coli.  Port a cath removed on 5/19.  Orthopedic surgery recommended CT of his thigh and knee, that showed 3 superficial abscesses of the left hip, lateral knee and proximal lower leg.  Ortho recommended general surgery and plastic surgery consultation.  Patient underwent excision of left hip subcutaneous tissue mass on 05/31/2021.  Tissue culture with moderate E. coli.  Pathology pending at the time of discharge.  ID recommends cefadroxil for 3 weeks from 05/31/2021 and arrange outpatient follow-up on 06/14/2021.    General surgery applied wound VAC to left hip excision site.  Dressing instruction provided and patient's ex-wife educated on wound care.  Home health RN ordered for more education and help.  Hospital course noteworthy of pancytopenia with severe neutropenia likely due to chemotherapy.  He received Granix.  Leukopenia resolved.  Thrombocytopenia improved tremendously.  Anemia stable.  Oncology to arrange outpatient follow-up.  Home health PT/OT/rolling walker/3 in 1 commode and shower tub ordered as recommended by therapy.

## 2021-05-18 NOTE — Research (Signed)
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 ?HOSPITALIZATION- ADVERSE EVENT ATTRIBUTION ? ?Patient was admitted to hospital on 05/17/21 for severe sepsis, dehydration, abscess of left leg, abscess of left arm, and chemotherapy induced neutropenia.  Initial study chemotherapy regimen completed 03/15/21, patient now on different regimen.  Per MD Mohamed all AE's found for this admission are unrelated to study, no action or reporting required. ? ?Wells Guiles 'Learta Codding' Declan Adamson, RN, BSN ?Clinical Research Nurse I ?05/18/21 ?9:42 AM ? ?

## 2021-05-18 NOTE — Progress Notes (Signed)
?   05/18/21 0925  ?Assess: MEWS Score  ?Temp 98.2 ?F (36.8 ?C)  ?BP 106/76  ?Pulse Rate (!) 108  ?Resp (!) 22  ?Level of Consciousness Alert  ?SpO2 100 %  ?O2 Device Room Air  ?Assess: MEWS Score  ?MEWS Temp 0  ?MEWS Systolic 0  ?MEWS Pulse 1  ?MEWS RR 1  ?MEWS LOC 0  ?MEWS Score 2  ?MEWS Score Color Yellow  ?Assess: if the MEWS score is Yellow or Red  ?Were vital signs taken at a resting state? Yes  ?Focused Assessment No change from prior assessment  ?Does the patient meet 2 or more of the SIRS criteria? Yes  ?Does the patient have a confirmed or suspected source of infection? Yes  ?Provider and Rapid Response Notified? No  ?MEWS guidelines implemented *See Row Information* No, previously yellow, continue vital signs every 4 hours  ?Assess: SIRS CRITERIA  ?SIRS Temperature  0  ?SIRS Pulse 1  ?SIRS Respirations  1  ?SIRS WBC 0  ?SIRS Score Sum  2  ? ? ?

## 2021-05-18 NOTE — Assessment & Plan Note (Deleted)
-   s/p debridement in ER with packing in place; no further intervention recommended per orthopedic surgery - Continue packing changes daily -De-escalate vancomycin and Rocephin down to Ancef

## 2021-05-18 NOTE — Consult Note (Addendum)
WOC Nurse Consult Note: ?Patient receiving care in Box. ?Reason for Consult: LUE and LLE wounds ?Wound type: ?Pressure Injury POA: Yes/No/NA ?Measurement: ?Wound bed: non-viewable wound bed to left knee wound. ?Drainage (amount, consistency, odor) serous on both area wound existing dressings. ?Periwound: intact ?Dressing procedure/placement/frequency: ?Remove the packing from the left knee wound. Using a cotton tipped applicator, insert a strip of 1/2 inch Iodoform packing Kellie Simmering [phone]). Leave a piece hanging out. Place a size appropriate foam dressing over it. Change daily. ? ?For the LUE place 1/2 inch Iodoform packing into areas that can be packed, then place as many vaseline gauzes as necessary to cover the skin tear area, cover with a foam dressing. Change every day. SATURATE WITH SALINE PRIOR TO REMOVING THE VASELINE GAUZE. Perform every 2 days. ? ?Monitor the wound area(s) for worsening of condition such as: ?Signs/symptoms of infection,  ?Increase in size,  ?Development of or worsening of odor, ?Development of pain, or increased pain at the affected locations.  Notify the medical team if any of these develop. ? ?Thank you for the consult.  Discussed plan of care with the patient and bedside nurse.  [place] nurse will not follow at this time.  Please re-consult the [place] team if needed. ? ?Val Riles, RN, MSN, CWOCN, CNS-BC, pager [phone]  ?

## 2021-05-18 NOTE — Consult Note (Addendum)
Reason for Consult: Left knee abscess  ?Referring Physician: Dwyane Dee, MD  ? ?[name] is an 66 y.o. male.  ?HPI:  [name] is a 66 year old male with a past medical history significant for metastatic small lung cancer.  He undergoing chemotherapy.  He had suffered multiple falls over the last week, and noticed wounds about his left knee and elbow over the last 5 or 6 days.  He noted progressive pain and swelling about the wounds and presented to the emergency department on 05/17/2021 and was found to be septic and source was thought to be from abscess formation about wounds to the left knee and left elbow.  He underwent bedside I&D in the emergency department for both abscesses.  The wounds were packed.  Blood cultures are showing E. Coli.  He is found to be immunosuppressed with absolute neutrophil count of 0.  He is currently receiving IV vancomycin and Rocephin per the hospitalist team.  Orthopedics is consulted for further evaluation and management, given the close proximity to the joints. ? ?Upon arrival, the patient stated his pain is controlled.  Nursing is changing packing about the left elbow abscesses. ? ?Past Medical History:  ?Diagnosis Date  ? Hypertension   ? Lung cancer ([place])   ? small cell RUL  ? ? ?Past Surgical History:  ?Procedure Laterality Date  ? BRONCHIAL BRUSHINGS  02/08/2020  ? Procedure: BRONCHIAL BRUSHINGS;  Surgeon: Freddi Starr, MD;  Location[facility] ENDOSCOPY;  Service: Pulmonary;;  ? BRONCHIAL NEEDLE ASPIRATION BIOPSY  02/08/2020  ? Procedure: BRONCHIAL NEEDLE ASPIRATION BIOPSIES;  Surgeon: Freddi Starr, MD;  Location[facility] ENDOSCOPY;  Service: Pulmonary;;  ? IR IMAGING GUIDED PORT INSERTION  12/30/2020  ? KNEE SURGERY Right   ? SHOULDER SURGERY Left   ? VIDEO BRONCHOSCOPY WITH ENDOBRONCHIAL ULTRASOUND N/A 02/08/2020  ? Procedure: VIDEO BRONCHOSCOPY WITH ENDOBRONCHIAL ULTRASOUND;  Surgeon: Freddi Starr, MD;  Location: [facility] ENDOSCOPY;  Service: Pulmonary;  Laterality: N/A;   ? ? ?Family History  ?Problem Relation Age of Onset  ? Heart disease Mother   ? Cancer Cousin   ? Breast cancer Neg Hx   ? Colon cancer Neg Hx   ? Pancreatic cancer Neg Hx   ? Prostate cancer Neg Hx   ? ? ?Social History:  reports that he quit smoking about 2 years ago. His smoking use included cigarettes. He has a 20.00 pack-year smoking history. He has never used smokeless tobacco. He reports that he does not currently use alcohol. He reports that he does not use drugs. ? ?Allergies: No Known Allergies ? ?Medications: I have reviewed the patient's current medications. ? ?Results for orders placed or performed during the hospital encounter of 05/17/21 (from the past 48 hour(s))  ?Comprehensive metabolic panel     Status: Abnormal  ? Collection Time: 05/17/21 11:42 AM  ?Result Value Ref Range  ? Sodium 137 135 - 145 mmol/L  ? Potassium 4.3 3.5 - 5.1 mmol/L  ? Chloride 105 98 - 111 mmol/L  ? CO2 25 22 - 32 mmol/L  ? Glucose, Bld 121 (H) 70 - 99 mg/dL  ?  Comment: Glucose reference range applies only to samples taken after fasting for at least 8 hours.  ? BUN 25 (H) 8 - 23 mg/dL  ? Creatinine, Ser 1.53 (H) 0.61 - 1.24 mg/dL  ? Calcium 8.6 (L) 8.9 - 10.3 mg/dL  ? Total Protein 7.5 6.5 - 8.1 g/dL  ? Albumin 2.7 (L) 3.5 - 5.0 g/dL  ?  AST 29 15 - 41 U/L  ? ALT 34 0 - 44 U/L  ? Alkaline Phosphatase 54 38 - 126 U/L  ? Total Bilirubin 0.9 0.3 - 1.2 mg/dL  ? GFR, Estimated 50 (L) >60 mL/min  ?  Comment: (NOTE) ?Calculated using the CKD-EPI Creatinine Equation (2021) ?  ? Anion gap 7 5 - 15  ?  Comment: Performed at [facility], [place] [address] [place], [place] [address]  ?Lactic acid, plasma     Status: None  ? Collection Time: 05/17/21 11:42 AM  ?Result Value Ref Range  ? Lactic Acid, Venous 1.9 0.5 - 1.9 mmol/L  ?  Comment: Performed at [facility], [place] [address], [place], [place] [address]  ?CBC with Differential     Status: Abnormal  ? Collection Time: 05/17/21 11:42 AM  ?Result  Value Ref Range  ? WBC 0.3 (LL) 4.0 - 10.5 K/uL  ?  Comment: REPEATED TO VERIFY ?THIS CRITICAL RESULT HAS VERIFIED AND BEEN CALLED TO HAMBLIN,H. RN BY NICOLE MCCOY ON 05 10 2023 AT 50, AND HAS BEEN READ BACK. CRITICAL RESULT VERIFIED ?  ? RBC 3.54 (L) 4.22 - 5.81 MIL/uL  ? Hemoglobin 9.9 (L) 13.0 - 17.0 g/dL  ? HCT 30.7 (L) 39.0 - 52.0 %  ? MCV 86.7 80.0 - 100.0 fL  ? MCH 28.0 26.0 - 34.0 pg  ? MCHC 32.2 30.0 - 36.0 g/dL  ? RDW 15.8 (H) 11.5 - 15.5 %  ? Platelets 74 (L) 150 - 400 K/uL  ?  Comment: SPECIMEN CHECKED FOR CLOTS ?Immature Platelet Fraction may be ?clinically indicated, consider ?ordering this additional test ?[id number] ?REPEATED TO VERIFY ?PLATELET COUNT CONFIRMED BY SMEAR ?  ? nRBC 0.0 0.0 - 0.2 %  ? Neutrophils Relative % 0 %  ? Neutro Abs 0.0 (LL) 1.7 - 7.7 K/uL  ?  Comment: REPEATED TO VERIFY ?THIS CRITICAL RESULT HAS VERIFIED AND BEEN CALLED TO HAMBLIN,H. RN BY NICOLE MCCOY ON 05 10 2023 AT 64, AND HAS BEEN READ BACK. CRITICAL RESULT VERIFIED ?  ? Lymphocytes Relative 96 %  ? Lymphs Abs 0.2 (L) 0.7 - 4.0 K/uL  ? Monocytes Relative 4 %  ? Monocytes Absolute 0.0 (L) 0.1 - 1.0 K/uL  ? Eosinophils Relative 0 %  ? Eosinophils Absolute 0.0 0.0 - 0.5 K/uL  ? Basophils Relative 0 %  ? Basophils Absolute 0.0 0.0 - 0.1 K/uL  ? Immature Granulocytes 0 %  ? Abs Immature Granulocytes 0.00 0.00 - 0.07 K/uL  ?  Comment: Performed at [facility], [place] [address] [place], [place] [address]  ?Protime-INR     Status: None  ? Collection Time: 05/17/21 11:42 AM  ?Result Value Ref Range  ? Prothrombin Time 14.9 11.4 - 15.2 seconds  ? INR 1.2 0.8 - 1.2  ?  Comment: (NOTE) ?INR goal varies based on device and disease states. ?Performed at [facility], [place] [address] ?[place], [place] [address] ?  ?Culture, blood (Routine x 2)     Status: None (Preliminary result)  ? Collection Time: 05/17/21 11:42 AM  ? Specimen: BLOOD  ?Result Value Ref Range  ? Specimen Description    ?  BLOOD  PORTA CATH ?Performed at [facility], [place] [address] [place], [place] [address] ?  ? Special Requests    ?  BOTTLES DRAWN AEROBIC AND ANAEROBIC Blood Culture results may not be optimal due to an excessive volume of blood received  in culture bottles ?Performed at [facility], [place] [address], [place], [place] [address] ?  ? Culture  Setup Time    ?  GRAM NEGATIVE RODS ?IN BOTH AEROBIC AND ANAEROBIC BOTTLES ?CRITICAL RESULT CALLED TO, READ BACK BY AND VERIFIED WITH: M [email or web address]  05/18/21 [place] ?  ? Culture    ?  NO GROWTH < 24 HOURS ?Performed at [facility], [place] [address] [place], [place] [address] ?  ? Report Status PENDING   ?Urinalysis, Routine w reflex microscopic Urine, Clean Catch     Status: Abnormal  ? Collection Time: 05/17/21 11:42 AM  ?Result Value Ref Range  ? Color, Urine YELLOW YELLOW  ? APPearance HAZY (A) CLEAR  ? Specific Gravity, Urine 1.012 1.005 - 1.030  ? pH 7.0 5.0 - 8.0  ? Glucose, UA NEGATIVE NEGATIVE mg/dL  ? Hgb urine dipstick NEGATIVE NEGATIVE  ? Bilirubin Urine NEGATIVE NEGATIVE  ? Ketones, ur NEGATIVE NEGATIVE mg/dL  ? Protein, ur 30 (A) NEGATIVE mg/dL  ? Nitrite POSITIVE (A) NEGATIVE  ? Leukocytes,Ua NEGATIVE NEGATIVE  ? RBC / HPF 0-5 0 - 5 RBC/hpf  ? WBC, UA 6-10 0 - 5 WBC/hpf  ? Bacteria, UA MANY (A) NONE SEEN  ? Mucus PRESENT   ? Hyaline Casts, UA PRESENT   ?  Comment: Performed at [facility], [place] [address] [place], [place] [address]  ?Blood Culture ID Panel (Reflexed)     Status: Abnormal  ? Collection Time: 05/17/21 11:42 AM  ?Result Value Ref Range  ? Enterococcus faecalis NOT DETECTED NOT DETECTED  ? Enterococcus Faecium NOT DETECTED NOT DETECTED  ? Listeria monocytogenes NOT DETECTED NOT DETECTED  ? Staphylococcus species NOT DETECTED NOT DETECTED  ? Staphylococcus aureus (BCID) NOT DETECTED NOT DETECTED  ? Staphylococcus epidermidis NOT DETECTED NOT DETECTED  ? Staphylococcus lugdunensis NOT DETECTED  NOT DETECTED  ? Streptococcus species NOT DETECTED NOT DETECTED  ? Streptococcus agalactiae NOT DETECTED NOT DETECTED  ? Streptococcus pneumoniae NOT DETECTED NOT DETECTED  ? Streptococcus pyogenes NO

## 2021-05-18 NOTE — Progress Notes (Signed)
PHARMACY - PHYSICIAN COMMUNICATION ?CRITICAL VALUE ALERT - BLOOD CULTURE IDENTIFICATION (BCID) ? ?Adam [name] is an 66 y.o. male who presented to [facility] on 05/17/2021 with a chief complaint of open wound to elbow & knee after fall.  ? ?Assessment:   ?Hx [facility] seen at cancer ctr yesterday due to multiple falls with concern for wounds at left elbow & knee.   ?Sent to ED due to concern for sepsis- remains tachycardic.  Wounds drained in ED.  ?Blood cx now growing GNR; BCID + Ecoli, no resistance.  ? ?Name of physician (or Provider) Contacted: Clarene Essex ? ?Current antibiotics: Ceftriaxone 2gm IV daily + Vancomycin 1250mg  IV q24h ? ?Changes to prescribed antibiotics recommended:  ?F/U with rounding provider to consider d/c Vancomycin if clinically appropriate ? ?Results for orders placed or performed during the hospital encounter of 05/17/21  ?Blood Culture ID Panel (Reflexed) (Collected: 05/17/2021 11:42 AM)  ?Result Value Ref Range  ? Enterococcus faecalis NOT DETECTED NOT DETECTED  ? Enterococcus Faecium NOT DETECTED NOT DETECTED  ? Listeria monocytogenes NOT DETECTED NOT DETECTED  ? Staphylococcus species NOT DETECTED NOT DETECTED  ? Staphylococcus aureus (BCID) NOT DETECTED NOT DETECTED  ? Staphylococcus epidermidis NOT DETECTED NOT DETECTED  ? Staphylococcus lugdunensis NOT DETECTED NOT DETECTED  ? Streptococcus species NOT DETECTED NOT DETECTED  ? Streptococcus agalactiae NOT DETECTED NOT DETECTED  ? Streptococcus pneumoniae NOT DETECTED NOT DETECTED  ? Streptococcus pyogenes NOT DETECTED NOT DETECTED  ? A.calcoaceticus-baumannii NOT DETECTED NOT DETECTED  ? Bacteroides fragilis NOT DETECTED NOT DETECTED  ? Enterobacterales DETECTED (A) NOT DETECTED  ? Enterobacter cloacae complex NOT DETECTED NOT DETECTED  ? Escherichia coli DETECTED (A) NOT DETECTED  ? Klebsiella aerogenes NOT DETECTED NOT DETECTED  ? Klebsiella oxytoca NOT DETECTED NOT DETECTED  ? Klebsiella pneumoniae NOT DETECTED NOT DETECTED  ? Proteus  species NOT DETECTED NOT DETECTED  ? Salmonella species NOT DETECTED NOT DETECTED  ? Serratia marcescens NOT DETECTED NOT DETECTED  ? Haemophilus influenzae NOT DETECTED NOT DETECTED  ? Neisseria meningitidis NOT DETECTED NOT DETECTED  ? Pseudomonas aeruginosa NOT DETECTED NOT DETECTED  ? Stenotrophomonas maltophilia NOT DETECTED NOT DETECTED  ? Candida albicans NOT DETECTED NOT DETECTED  ? Candida auris NOT DETECTED NOT DETECTED  ? Candida glabrata NOT DETECTED NOT DETECTED  ? Candida krusei NOT DETECTED NOT DETECTED  ? Candida parapsilosis NOT DETECTED NOT DETECTED  ? Candida tropicalis NOT DETECTED NOT DETECTED  ? Cryptococcus neoformans/gattii NOT DETECTED NOT DETECTED  ? CTX-M ESBL NOT DETECTED NOT DETECTED  ? Carbapenem resistance IMP NOT DETECTED NOT DETECTED  ? Carbapenem resistance KPC NOT DETECTED NOT DETECTED  ? Carbapenem resistance NDM NOT DETECTED NOT DETECTED  ? Carbapenem resist OXA 48 LIKE NOT DETECTED NOT DETECTED  ? Carbapenem resistance VIM NOT DETECTED NOT DETECTED  ? ? ?Netta Cedars PharmD ?05/18/2021  4:07 AM ? ?

## 2021-05-18 NOTE — Progress Notes (Signed)
?   05/17/21 2036  ?Provider Notification  ?Provider Name/Title J. Olena Heckle  ?Date Provider Notified 05/17/21  ?Time Provider Notified 2037  ?Method of Notification Page  ?Notification Reason Critical result  ?Test performed and critical result Lactic 2.7  ?Date Critical Result Received 05/17/21  ?Time Critical Result Received 2036  ?Provider response See new orders  ?Date of Provider Response 05/17/21  ?Time of Provider Response 2049  ? ? ?

## 2021-05-18 NOTE — Progress Notes (Addendum)
HEMATOLOGY-ONCOLOGY PROGRESS NOTE ? 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  He is receiving palliative systemic chemotherapy with Zepzelca 3.2 mg per metered squared every 3 weeks.  Status post 2 cycles.  Last cycle was given on 05/10/2021. ? ?Now admitted with sepsis secondary to cellulitis/abscess.  He is on IV antibiotics.  Cultures are pending.  Continue management of sepsis and cellulitis/abscess per hospitalist. ? ?Noted to have pancytopenia due to recent chemotherapy.  Would recommend continuing Granix but will increase the dose from 300 g up to 480 mcg subcu daily.  May discontinue once ANC is 1.5 or higher.  His hemoglobin today is 8.5.  Recommend PRBC transfusion if his hemoglobin drops below 8.  Platelet count is 33,000 this morning.  He is not actively bleeding.  Recommend platelet transfusion if platelets drop below 20,000 or active bleeding. ? ?Mikey Bussing, DNP, AGPCNP-BC, AOCNP ? ?The total time spent in the visit was 40 minutes. Greater than 50%  of this time was spent counseling and coordinating care related to the above assessment and plan.  ? ?SUBJECTIVE: The patient was seen in our office yesterday following a fall.  He has had 3 mechanical falls over the past week and fell in our lobby yesterday morning.  From his falls, he obtained injuries to his left elbow and knee.  He was noted to have a low-grade fever in our office yesterday.  He was  tachycardic and had a low blood pressure.  There was concern for cellulitis.  He was transferred to the emergency department for further evaluation.  Now admitted for sepsis secondary to left forearm and left knee abscess/cellulitis.  Cultures obtained.  On IV antibiotics.  Wound care nurse has evaluated.  Noted to have pancytopenia.  Was given a dose of Granix 300 mcg subcu last evening. ? ?This morning, not having any recurrent fevers.  Remains tachycardic.  No bleeding reported.  No other complaints. ? ?Oncology History Overview Note  ?He is currently on the following trial. Please refer to my original note for complete history. ? 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 ?C1 D1 02/16/2020-02/18/2020 ?He has consented for the trial and is enrolled to the arm without immunotherapy. ?C2 D1-D3 03/15/2020-03/17/2020 ?C3D1 completed 04/05/2020 through 04/07/2020 ?C4D1 delayed by a week because of neutropenia. ?C4D1 completed 4/26-22-05/05/2020 ?  ?Primary cancer of right upper lobe of lung ([place])  ?02/06/2020 Initial Diagnosis  ? Primary cancer of right upper lobe of lung ([place]) ? ?  ?02/10/2020 Cancer Staging  ? Staging form: Lung, AJCC 8th Edition ?- Pathologic stage from 02/10/2020: No Stage Recommended (cT1c, cN2, cM0) - Signed by Benay Pike, MD on 02/10/2020 ?Histopathologic type: Small cell carcinoma, NOS ?Stage prefix: Initial diagnosis ?Histologic grade (G): GX ?Histologic grading system: 4 grade system ? ?  ?Small cell lung cancer, 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[place])  ?02/10/2020 Initial Diagnosis  ? Small cell lung cancer in adult (  [place]) ? ?  ?02/16/2020 - 02/18/2020 Chemotherapy  ?  ? ?  ? ?  ?03/15/2020 - 05/05/2020 Chemotherapy  ? Patient is on Treatment Plan : LUNG SMALL CELL - LIMITED STAGE RESEARCH [id number] CISPLATIN D1 / ETOPOSIDE D1-3 ARM 1  ? ?   ?12/14/2020 - 03/15/2021 Chemotherapy  ? Patient is on Treatment Plan : LUNG  SMALL CELL EXTENSIVE STAGE Durvalumab + Carboplatin D1 + Etoposide D1-3 q21d x 4 Cycles / Durvalumab q28d  ? ?   ?04/19/2021 -  Chemotherapy  ? Patient is on Treatment Plan : LUNG SMALL CELL Lurbinectedin q21d  ? ?   ? ? ? ?REVIEW OF SYSTEMS:   ?Review of Systems  ?Constitutional:  Negative for fever.  ?HENT: Negative.    ?Eyes: Negative.   ?Respiratory: Negative.    ?Cardiovascular: Negative.   ?Gastrointestinal: Negative.   ?Skin:   ?     Draining wounds to his left knee and left elbow  ?Neurological: Negative.   ?Endo/Heme/Allergies:  Does not bruise/bleed easily.  ?Psychiatric/Behavioral: Negative.    ? ?I have reviewed the past medical history, past surgical history, social history and family history with the patient and they are unchanged from previous note. ? ? ?PHYSICAL EXAMINATION: ?ECOG PERFORMANCE STATUS: 2 - Symptomatic, <50% confined to bed ? ?Vitals:  ? 05/18/21 0530 05/18/21 0925  ?BP: 122/74 106/76  ?Pulse: (!) 102 (!) 108  ?Resp: 16 (!) 22  ?Temp: 98.1 ?F (36.7 ?C) 98.2 ?F (36.8 ?C)  ?SpO2: 100% 100%  ? ?There were no vitals filed for this visit. ? ?Intake/Output from previous day: ?05/10 0701 - 05/11 0700 ?In: 2350 [IV Piggyback:2350] ?Out: 1500 [Urine:1500] ? ?Physical Exam ?Vitals reviewed.  ?Constitutional:   ?   General: He is not in acute distress. ?HENT:  ?   Head: Normocephalic.  ?Eyes:  ?   General: No scleral icterus. ?Cardiovascular:  ?   Rate and Rhythm: Tachycardia present.  ?Pulmonary:  ?   Effort: Pulmonary effort is normal. No respiratory distress.  ?Skin: ?   Comments: Left knee and left elbow wounds noted to have purulent drainage.  ?Neurological:  ?   Mental Status: He is alert and oriented to person, place, and time.  ? ? ?LABORATORY DATA:  ?I have reviewed the data as listed ? ?  Latest Ref Rng & Units 05/18/2021  ?  5:01 AM 05/17/2021  ? 11:42 AM 05/10/2021  ?  9:33 AM  ?CMP  ?Glucose 70 - 99 mg/dL 104   121   113    ?BUN 8 - 23 mg/dL 18   25   16     ?Creatinine 0.61 - 1.24 mg/dL  0.89   1.53   0.89    ?Sodium 135 - 145 mmol/L 139   137   137    ?Potassium 3.5 - 5.1 mmol/L 3.8   4.3   2.8    ?Chloride 98 - 111 mmol/L 107   105   100    ?CO2 22 - 32 mmol/L 25   25   29     ?Calcium 8.9 - 10.3 mg/dL 8.5   8.6   9.2    ?Total Protein 6.5 - 8.1 g/dL 6.3   7.5   7.6    ?Total Bilirubin 0.3 - 1.2 mg/dL 1.0   0.9   0.6    ?Alkaline Phos 38 - 126 U/L 44   54   64    ?AST 15 - 41 U/L 23   29  20    ?ALT 0 - 44 U/L 26   34   21    ? ? ?Lab Results  ?Component Value Date  ? WBC 0.2 (LL) 05/18/2021  ? HGB 8.5 (L) 05/18/2021  ? HCT 26.4 (L) 05/18/2021  ? MCV 85.7 05/18/2021  ? PLT 33 (L) 05/18/2021  ? NEUTROABS 0.0 (LL) 05/18/2021  ? ? ?No results found for: CEA1, CEA, [id number], CA125, PSA1 ? ?DG Chest 2 View ? ?Result Date: 05/17/2021 ?CLINICAL DATA:  Suspected sepsis.  History of lung cancer. EXAM: CHEST - 2 VIEW COMPARISON:  04/07/2021 CT FINDINGS: Right Port-A-Cath tip at superior caval/atrial junction. Numerous leads and wires project over the chest. Tracheal deviation right. Right hemidiaphragm elevation. Normal heart size. No pleural fluid. 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 Radiation change within the right suprahilar region and paramediastinal lung. No superimposed lobar consolidation. IMPRESSION: No evidence of pneumonia. 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 Emphysema (ICD10-J43.9). Electronically Signed   By: Abigail Miyamoto M.D.   On: 05/17/2021 12:12  ? ?DG Forearm Left ? ?Result Date: 05/17/2021 ?CLINICAL DATA:  Left forearm wound after fall several weeks ago. EXAM: LEFT FOREARM - 2 VIEW COMPARISON:  None Available. FINDINGS: There is no evidence of fracture or other focal bone lesions. Large amount of soft tissue gas is seen around proximal forearm. Some high density material is noted within the soft tissues which may represent either surgical packing or other foreign  body. IMPRESSION: No fracture or dislocation is noted. Large amount of soft tissue gas seen in proximal forearm consistent with laceration or other wounds. High density material is seen in these areas which may re

## 2021-05-18 NOTE — Evaluation (Signed)
Physical Therapy Evaluation ?Patient Details ?Name: Adam [name] ?MRN: [id number] ?DOB: [birth year] ?Today's Date: 05/18/2021 ? ?History of Present Illness ? Adam [name] is a 66 y.o. male with medical history significant of metastatic small cell lung cancer, HTN. Presenting with multiple falls over the last week. Today presented to his [facility] clinic for regular follow up and with concerns of left knee and left forearm after falls at home. ?  ?Clinical Impression ? Pt is a 66 y.o. male with above HPI resulting in the deficits listed below (see PT Problem List). Pt lived alone and was independent prior to admission, reports he will be staying with his ex-wife upon discharge. Pt has history of recurrent falls, states that he just "loses balance" and is typically able to get up on his own. On eval, pt performed sit to stand transfers with MIN guard for safety. Ambulated a total of ~34ft with use of quad cane and RW with MIN guard for stability. Pt with improved stability with use of RW vs. Quad cane. Pt will benefit from continued skilled PT to maximize functional mobility and increase independence.  ?   ?   ? ?Recommendations for follow up therapy are one component of a multi-disciplinary discharge planning process, led by the attending physician.  Recommendations may be updated based on patient status, additional functional criteria and insurance authorization. ? ?Follow Up Recommendations Home health PT ? ?  ?Assistance Recommended at Discharge Frequent or constant Supervision/Assistance  ?Patient can return home with the following ? A little help with walking and/or transfers;A little help with bathing/dressing/bathroom;Assist for transportation;Help with stairs or ramp for entrance;Assistance with cooking/housework ? ?  ?Equipment Recommendations Other (comment);Rolling walker (2 wheels) (pt states that he is considering shower seat- discussed benefits due to recurrent falls)  ?Recommendations for Other Services ?    ?   ?Functional Status Assessment Patient has had a recent decline in their functional status and demonstrates the ability to make significant improvements in function in a reasonable and predictable amount of time.  ? ?  ?Precautions / Restrictions Precautions ?Precautions: Fall ?Precaution Comments: multiple falls, LUE and LLE wound ?Restrictions ?Weight Bearing Restrictions: No  ? ?  ? ?Mobility ? Bed Mobility ?Overal bed mobility: Needs Assistance ?Bed Mobility: Supine to Sit, Sit to Supine ?  ?  ?Supine to sit: Min assist ?Sit to supine: Supervision ?  ?General bed mobility comments: MIN HHA to bring trunk to upright ?  ? ?Transfers ?Overall transfer level: Needs assistance ?Equipment used: Quad cane ?Transfers: Sit to/from Stand ?  ?  ?  ?  ?  ?  ?General transfer comment: MIN guard for safety ?  ? ?Ambulation/Gait ?Ambulation/Gait assistance: Min guard ?Gait Distance (Feet): 50 Feet ?Assistive device: Rolling walker (2 wheels), Quad cane ?Gait Pattern/deviations: Step-through pattern, Decreased stride length ?Gait velocity: decr ?  ?  ?General Gait Details: 19ft with use of small base quad cane, mild drifting x1 mild stagger step, remaining 40ft with use of RW improved stability with use of RW, pt in agreement. ? ?Stairs ?  ?  ?  ?  ?  ? ?Wheelchair Mobility ?  ? ?Modified Rankin (Stroke Patients Only) ?  ? ?  ? ?Balance Overall balance assessment: Mild deficits observed, not formally tested ?  ?  ?  ?  ?  ?  ?  ?  ?  ?  ?  ?  ?  ?  ?  ?  ?  ?  ?   ? ? ? ?  Pertinent Vitals/Pain Pain Assessment ?Pain Assessment: No/denies pain  ? ? ?Home Living Family/patient expects to be discharged to:: Unsure ?Living Arrangements: Alone ?  ?  ?  ?  ?  ?  ?  ?  ?Additional Comments: Typically lives alone, reports he's going to stay at his ex's house. 1 step. walk in shower  ?  ?Prior Function Prior Level of Function : Independent/Modified Independent ?  ?  ?  ?  ?  ?  ?Mobility Comments: uses cane/rollator as needed - been  falling. ?ADLs Comments: independent till recently, tub/shower at his house ?  ? ? ?Hand Dominance  ? Dominant Hand: Right ? ?  ?Extremity/Trunk Assessment  ? Upper Extremity Assessment ?Upper Extremity Assessment: Defer to OT evaluation ?  ? ?Lower Extremity Assessment ?Lower Extremity Assessment: RLE deficits/detail;LLE deficits/detail ?RLE Deficits / Details: grossly 4-/5 throughout ?LLE Deficits / Details: grossly 4-/5 throughout ?  ? ?Cervical / Trunk Assessment ?Cervical / Trunk Assessment: Normal  ?Communication  ? Communication: No difficulties  ?Cognition Arousal/Alertness: Awake/alert ?Behavior During Therapy: Flat affect ?Overall Cognitive Status: Within Functional Limits for tasks assessed ?  ?  ?  ?  ?  ?  ?  ?  ?  ?  ?  ?  ?  ?  ?  ?  ?General Comments: Mostly functional. Alert to self, year, location with cuing. states it is April, verbal cues for correct month of May. [place] when asked where he is, but [facility] to cancer center". Able to follow commands. Has a flat affect. Hx of brain mets. ?  ?  ? ?  ?General Comments   ? ?  ?Exercises    ? ?Assessment/Plan  ?  ?PT Assessment Patient needs continued PT services  ?PT Problem List Decreased strength;Decreased activity tolerance;Decreased balance;Decreased mobility ? ?   ?  ?PT Treatment Interventions DME instruction;Gait training;Stair training;Functional mobility training;Therapeutic activities;Therapeutic exercise;Balance training;Patient/family education   ? ?PT Goals (Current goals can be found in the Care Plan section)  ?Acute Rehab PT Goals ?Patient Stated Goal: Get stronger ?PT Goal Formulation: With patient ?Time For Goal Achievement: 06/01/21 ?Potential to Achieve Goals: Fair ? ?  ?Frequency Min 3X/week ?  ? ? ?Co-evaluation   ?  ?  ?  ?  ? ? ?  ?AM-PAC PT "6 Clicks" Mobility  ?Outcome Measure Help needed turning from your back to your side while in a flat bed without using bedrails?: None ?Help  needed moving from lying on your back to sitting on the side of a flat bed without using bedrails?: A Little ?Help needed moving to and from a bed to a chair (including a wheelchair)?: A Little ?Help needed standing up from a chair using your arms (e.g., wheelchair or bedside chair)?: A Little ?Help needed to walk in hospital room?: A Little ?Help needed climbing 3-5 steps with a railing? : A Little ?6 Click Score: 19 ? ?  ?End of Session Equipment Utilized During Treatment: Gait belt ?Activity Tolerance: Patient tolerated treatment well ?Patient left: in bed;with call bell/phone within reach;with chair alarm set ?Nurse Communication: Mobility status ?PT Visit Diagnosis: Unsteadiness on feet (R26.81);Muscle weakness (generalized) (M62.81);Repeated falls (R29.6) ?  ? ?Time: 8144-8185 ?PT Time Calculation (min) (ACUTE ONLY): 16 min ? ? ?Charges:   PT Evaluation ?$PT Eval Low Complexity: 1 Low ?  ?  ?   ? ? ?Festus Barren., PT, DPT  ?Acute Rehabilitation Services  ?Office [phone] ?05/18/2021, 5:35  PM ? ?

## 2021-05-18 NOTE — Assessment & Plan Note (Deleted)
-   Tachycardia, tachypnea, neutropenia, lactic acidosis; source presumed skin abscesses and bacteremia still of unknown origin - see bacteremia and cellulitis

## 2021-05-19 DIAGNOSIS — R7881 Bacteremia: Secondary | ICD-10-CM | POA: Diagnosis not present

## 2021-05-19 DIAGNOSIS — D709 Neutropenia, unspecified: Secondary | ICD-10-CM

## 2021-05-19 DIAGNOSIS — L03114 Cellulitis of left upper limb: Secondary | ICD-10-CM | POA: Diagnosis not present

## 2021-05-19 DIAGNOSIS — D696 Thrombocytopenia, unspecified: Secondary | ICD-10-CM

## 2021-05-19 DIAGNOSIS — A4151 Sepsis due to Escherichia coli [E. coli]: Secondary | ICD-10-CM | POA: Diagnosis not present

## 2021-05-19 DIAGNOSIS — C3411 Malignant neoplasm of upper lobe, right bronchus or lung: Secondary | ICD-10-CM | POA: Diagnosis not present

## 2021-05-19 DIAGNOSIS — D701 Agranulocytosis secondary to cancer chemotherapy: Secondary | ICD-10-CM | POA: Diagnosis not present

## 2021-05-19 DIAGNOSIS — L03116 Cellulitis of left lower limb: Secondary | ICD-10-CM | POA: Diagnosis not present

## 2021-05-19 LAB — CBC WITH DIFFERENTIAL/PLATELET
Abs Immature Granulocytes: 0 10*3/uL (ref 0.00–0.07)
Basophils Absolute: 0 10*3/uL (ref 0.0–0.1)
Basophils Relative: 0 %
Eosinophils Absolute: 0 10*3/uL (ref 0.0–0.5)
Eosinophils Relative: 0 %
HCT: 25.7 % — ABNORMAL LOW (ref 39.0–52.0)
Hemoglobin: 8.4 g/dL — ABNORMAL LOW (ref 13.0–17.0)
Immature Granulocytes: 0 %
Lymphocytes Relative: 90 %
Lymphs Abs: 0.2 10*3/uL — ABNORMAL LOW (ref 0.7–4.0)
MCH: 28.4 pg (ref 26.0–34.0)
MCHC: 32.7 g/dL (ref 30.0–36.0)
MCV: 86.8 fL (ref 80.0–100.0)
Monocytes Absolute: 0 10*3/uL — ABNORMAL LOW (ref 0.1–1.0)
Monocytes Relative: 5 %
Neutro Abs: 0 10*3/uL — CL (ref 1.7–7.7)
Neutrophils Relative %: 5 %
Platelets: 20 10*3/uL — CL (ref 150–400)
RBC: 2.96 MIL/uL — ABNORMAL LOW (ref 4.22–5.81)
RDW: 15.4 % (ref 11.5–15.5)
WBC: 0.2 10*3/uL — CL (ref 4.0–10.5)
nRBC: 0 % (ref 0.0–0.2)

## 2021-05-19 LAB — BASIC METABOLIC PANEL
Anion gap: 8 (ref 5–15)
BUN: 19 mg/dL (ref 8–23)
CO2: 23 mmol/L (ref 22–32)
Calcium: 8.2 mg/dL — ABNORMAL LOW (ref 8.9–10.3)
Chloride: 107 mmol/L (ref 98–111)
Creatinine, Ser: 0.96 mg/dL (ref 0.61–1.24)
GFR, Estimated: >60 mL/min (ref >60)
Glucose, Bld: 104 mg/dL — ABNORMAL HIGH (ref 70–99)
Potassium: 3.7 mmol/L (ref 3.5–5.1)
Sodium: 138 mmol/L (ref 135–145)

## 2021-05-19 LAB — ABO/RH: ABO/RH(D): O POS

## 2021-05-19 LAB — MAGNESIUM: Magnesium: 1.6 mg/dL — ABNORMAL LOW (ref 1.7–2.4)

## 2021-05-19 MED ORDER — VANCOMYCIN HCL 1500 MG/300ML IV SOLN
1500.0000 mg | INTRAVENOUS | Status: DC
  Administered 2021-05-19 – 2021-05-20 (×2): 1500 mg via INTRAVENOUS
  Filled 2021-05-19 (×2): qty 300

## 2021-05-19 MED ORDER — MAGNESIUM SULFATE 2 GM/50ML IV SOLN
2.0000 g | Freq: Once | INTRAVENOUS | Status: AC
  Administered 2021-05-19: 2 g via INTRAVENOUS
  Filled 2021-05-19: qty 50

## 2021-05-19 MED ORDER — SODIUM CHLORIDE 0.9% IV SOLUTION: Freq: Once | INTRAVENOUS | Status: AC

## 2021-05-19 NOTE — Progress Notes (Signed)
HEMATOLOGY-ONCOLOGY PROGRESS NOTE ? 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  He is receiving palliative systemic chemotherapy with Zepzelca 3.2 mg per metered squared every 3 weeks.  Status post 2 cycles.  Last cycle was given on 05/10/2021. ? ?Now admitted with sepsis secondary to cellulitis/abscess.  He has E. coli bacteremia.  He is on IV antibiotics.  Continue management of sepsis and cellulitis/abscess per hospitalist.  Repeat blood cultures drawn earlier today. ? ?Noted to have pancytopenia due to recent chemotherapy and sepsis.  Continue Granix 480 mcg subcu daily.  May discontinue once ANC is 1.5 or higher.  Hemoglobin is stable at 8.4.  No need for PRBC transfusion.  Recommend PRBC transfusion for hemoglobin less than 8.  Platelets this morning are 20,000.  We will give 1 unit of platelets.  Recommend platelet transfusion for platelet count less than 20,000 or active bleeding. ? ?Please call medical oncology over the weekend if questions arise. ? ?Mikey Bussing, DNP, AGPCNP-BC, AOCNP ? ?The total time spent in the visit was 40 minutes. Greater than 50%  of this time was spent counseling and coordinating care related to the above assessment and plan.  ? ?SUBJECTIVE: No new complaints today.  Denies bleeding.  Afebrile.  Blood cultures positive for E. coli.  Sensitivities pending. ? ?Oncology History Overview Note  ?He is currently on the following trial. Please  refer to my original note for complete history. ? 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 ?C1 D1 02/16/2020-02/18/2020 ?He has consented for the trial and is enrolled to the arm without immunotherapy. ?C2 D1-D3 03/15/2020-03/17/2020 ?C3D1 completed 04/05/2020 through 04/07/2020 ?C4D1 delayed by a week because of neutropenia. ?C4D1 completed 4/26-22-05/05/2020 ?  ?Primary cancer of right upper lobe of lung ([place])  ?02/06/2020 Initial Diagnosis  ? Primary cancer of right upper lobe of lung ([place]) ?  ?02/10/2020 Cancer Staging  ? Staging form: Lung, AJCC 8th Edition ?- Pathologic stage from 02/10/2020: No Stage Recommended (cT1c, cN2, cM0) - Signed by Benay Pike, MD on 02/10/2020 ?Histopathologic type: Small cell carcinoma, NOS ?Stage prefix: Initial diagnosis ?Histologic grade (G): GX ?Histologic grading system: 4 grade system ?  ?Small cell lung cancer, 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[place])  ?02/10/2020 Initial Diagnosis  ? Small cell lung cancer in adult [facility]) ?  ?02/16/2020 - 02/18/2020 Chemotherapy  ?  ? ?  ?  ?03/15/2020 - 05/05/2020 Chemotherapy  ? Patient is on Treatment Plan : LUNG SMALL CELL - LIMITED STAGE RESEARCH [id number] CISPLATIN D1 / ETOPOSIDE D1-3 ARM 1  ?   ?12/14/2020 - 03/15/2021 Chemotherapy  ? Patient is on Treatment Plan : LUNG SMALL CELL EXTENSIVE STAGE Durvalumab + Carboplatin D1 + Etoposide D1-3 q21d x 4 Cycles / Durvalumab q28d  ?   ?04/19/2021 -  Chemotherapy  ? Patient is on Treatment Plan : LUNG SMALL CELL Lurbinectedin q21d  ?   ? ? ? ?REVIEW OF SYSTEMS:   ?  Review of Systems  ?Constitutional:  Negative for fever.  ?HENT: Negative.    ?Eyes: Negative.   ?Respiratory: Negative.    ?Cardiovascular: Negative.   ?Gastrointestinal: Negative.   ?Skin:   ?     Draining wounds to his left knee and left elbow  ?Neurological: Negative.   ?Endo/Heme/Allergies:  Does not bruise/bleed easily.  ?Psychiatric/Behavioral:  Negative.    ? ?I have reviewed the past medical history, past surgical history, social history and family history with the patient and they are unchanged from previous note. ? ? ?PHYSICAL EXAMINATION: ?ECOG PERFORMANCE STATUS: 2 - Symptomatic, <50% confined to bed ? ?Vitals:  ? 05/18/21 2119 05/19/21 0406  ?BP: 123/73 111/73  ?Pulse: (!) 108 (!) 105  ?Resp: 20 20  ?Temp: 97.8 ?F (36.6 ?C) 98.6 ?F (37 ?C)  ?SpO2: 100% 100%  ? ?There were no vitals filed for this visit. ? ?Intake/Output from previous day: ?05/11 0701 - 05/12 0700 ?In: 339.5 [P.O.:200; IV Piggyback:139.5] ?Out: 700 [Urine:700] ? ?Physical Exam ?Vitals reviewed.  ?Constitutional:   ?   General: He is not in acute distress. ?HENT:  ?   Head: Normocephalic.  ?Eyes:  ?   General: No scleral icterus. ?Cardiovascular:  ?   Rate and Rhythm: Tachycardia present.  ?Pulmonary:  ?   Effort: Pulmonary effort is normal. No respiratory distress.  ?Skin: ?   Comments: Left knee and left elbow wounds noted to have purulent drainage.  ?Neurological:  ?   Mental Status: He is alert and oriented to person, place, and time.  ? ? ?LABORATORY DATA:  ?I have reviewed the data as listed ? ?  Latest Ref Rng & Units 05/19/2021  ?  3:50 AM 05/18/2021  ?  5:01 AM 05/17/2021  ? 11:42 AM  ?CMP  ?Glucose 70 - 99 mg/dL 104   104   121    ?BUN 8 - 23 mg/dL 19   18   25     ?Creatinine 0.61 - 1.24 mg/dL 0.96   0.89   1.53    ?Sodium 135 - 145 mmol/L 138   139   137    ?Potassium 3.5 - 5.1 mmol/L 3.7   3.8   4.3    ?Chloride 98 - 111 mmol/L 107   107   105    ?CO2 22 - 32 mmol/L 23   25   25     ?Calcium 8.9 - 10.3 mg/dL 8.2   8.5   8.6    ?Total Protein 6.5 - 8.1 g/dL  6.3   7.5    ?Total Bilirubin 0.3 - 1.2 mg/dL  1.0   0.9    ?Alkaline Phos 38 - 126 U/L  44   54    ?AST 15 - 41 U/L  23   29    ?ALT 0 - 44 U/L  26   34    ? ? ?Lab Results  ?Component Value Date  ? WBC 0.2 (LL) 05/19/2021  ? HGB 8.4 (L) 05/19/2021  ? HCT 25.7 (L) 05/19/2021  ? MCV 86.8 05/19/2021  ? PLT 20 (LL) 05/19/2021   ? NEUTROABS 0.0 (LL) 05/19/2021  ? ? ?No results found for: CEA1, CEA, [id number], CA125, PSA1 ? ?DG Chest 2 View ? ?Result Date: 05/17/2021 ?CLINICAL DATA:  Suspected sepsis.  History of lung cancer. EXAM: CHEST - 2 VIEW COMPARISON:  04/07/2021 CT FINDINGS: Right Port-A-Cath tip at superior caval/atrial junction. Numerous leads and wires project over the chest. Tracheal deviation right. Right hemidiaphragm  elevation. Normal heart size. No pleural fluid. 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 Radiation change within the right suprahilar region and paramediastinal lung. No superimposed lobar consolidation. IMPRESSION: No evidence of pneumonia. 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 Emphysema (ICD10-J43.9). Electronically Signed   By: Abigail Miyamoto M.D.   On: 05/17/2021 12:12  ? ?DG Forearm Left ? ?Result Date: 05/17/2021 ?CLINICAL DATA:  Left forearm wound after fall several weeks ago. EXAM: LEFT FOREARM - 2 VIEW COMPARISON:  None Available. FINDINGS: There is no evidence of fracture or other focal bone lesions. Large amount of soft tissue gas is seen around proximal forearm. Some high density material is noted within the soft tissues which may represent either surgical packing or other foreign body. IMPRESSION: No fracture or dislocation is noted. Large amount of soft tissue gas seen in proximal forearm consistent with laceration or other wounds. High density material is seen in these areas which may represent surgical packing or other foreign body; clinical correlation is recommended Electronically Signed   By: Marijo Conception M.D.   On: 05/17/2021 20:41  ? ?DG Knee 1-2 Views Left ? ?Result Date: 05/17/2021 ?CLINICAL DATA:  Left knee wound after fall. EXAM: LEFT KNEE - 1-2 VIEW COMPARISON:  None Available. FINDINGS: No evidence of fracture, dislocation, or joint effusion. No evidence of arthropathy.  Mild patellar spurring is noted. Vascular calcifications are noted. Soft tissue gas is noted lateral to the joint space consistent with laceration. IMPRESSION: Soft tissue gas noted lateral to the joint space con

## 2021-05-19 NOTE — Assessment & Plan Note (Addendum)
Likely due to chemotherapy.  Transfused about 3 apheresis.  Platelet improved to 125 on the day of discharge -Check CBC at follow-up

## 2021-05-19 NOTE — Assessment & Plan Note (Addendum)
Felt to be due to chemotherapy.  Received Granix on 5/20.  Resolved.

## 2021-05-19 NOTE — Progress Notes (Signed)
Pharmacy Antibiotic Note ? ?Adam [name] is a 66 y.o. male admitted on 05/17/2021 with sepsis secondary to L forarm and left knee abscesses/cellulitis s/p falls.  Pt underwent bedside I&D on 5/10 for left knee and left elbow abscesses. PMH significant for lung cancer, currently on chemotherapy.  ? ?Patient currently on ceftriaxone for E.coli bacteremia, vancomycin for cellulitis - pharmacy consulted to dose.  ? ?Today, 05/19/21 ?Renal function improved to baseline ?Pt remains neutropenic, ANC 0. On Granix ?Day #3 of IV antibiotics ? ?Plan: ?Continue ceftriaxone 2 g IV q24h for bacteremia ?Increase vancomycin dose based on improved renal function. Vancomycin 1500 mg IV q24h for estimated AUC of 427 ?Goal vancomycin AUC 400-550. Monitor levels at steady state as needed.  ?Monitor renal function and culture data ?  ? ?Temp (24hrs), Avg:98.2 ?F (36.8 ?C), Min:97.8 ?F (36.6 ?C), Max:98.6 ?F (37 ?C) ? ?Recent Labs  ?Lab 05/17/21 ?1142 05/17/21 ?1859 05/18/21 ?0501 05/19/21 ?0350  ?WBC 0.3*  --  0.2* 0.2*  ?CREATININE 1.53*  --  0.89 0.96  ?LATICACIDVEN 1.9 2.7*  --   --   ? ?  ?Estimated Creatinine Clearance: 77.7 mL/min (by C-G formula based on SCr of 0.96 mg/dL).   ? ?No Known Allergies ? ?Antimicrobials this admission: ?5/10 Ceftriaxone >>  ?5/10 Vancomycin >>  ? ?Dose adjustments this admission: ?5/12: Vancomycin 1250 IV q24 > 1500 mg IV q24h ? ?Microbiology results: ?5/10 BCx2: E.coli, sensitivities pending ?5/12 repeat BCx:  ? ?Lenis Noon, PharmD ?05/19/21 ?7:25 AM ?

## 2021-05-19 NOTE — Progress Notes (Signed)
?Progress Note ? ? ? ?Adam [name]   ?[name]:[id number]  ?DOB: 08-19-1955  ?DOA: 05/17/2021     2 ?PCP: de [place], [name], MD ? ?Initial CC: left forearm wound and left knee wound; falls ? ?Hospital Course: ?Adam [name] is a 66 y.o. male with PMH metastatic small cell lung cancer, HTN. Presented with multiple falls over the last week.  ?He had presented to the cancer center clinic for follow-up and was having swelling in his left knee and left forearm near his elbow.  He was referred to the ER from the cancer center for further evaluation.  In addition to his falls he has also appeared to be weaker per family on admission.  He underwent bedside debridement in the ER of his forearm and knee swelling with packing placed afterwards.  He was started on IV antibiotics.  After admission he was also found to be bacteremic with blood cultures growing E. coli. ?Orthopedic surgery was also consulted on admission for further evaluation of his underlying abscesses. ? ?Interval History:  ?No events overnight.  More awake and alert this morning.  He also ambulated with physical therapy yesterday.  Otherwise doing okay with some improvement in his pain today. ? ?Assessment and Plan: ?* Sepsis (HCC)-resolved as of 05/19/2021 ?- Tachycardia, tachypnea, neutropenia, lactic acidosis; source presumed skin abscesses and bacteremia still of unknown origin ?- see bacteremia and cellulitis  ? ?Severe neutropenia (HCC) ?- Continue neutropenic precautions ?- Treating cellulitis with abscesses and bacteremia as noted ?- ANC 10 ?- Continue Granix ? ?E coli bacteremia ?- Blood cultures on admission growing E. Coli (? 2/3 bottles) ?- given immunosuppression would like to check repeat cultures for clearance  ?- continue rocephin  ?-Follow-up repeat cultures from 05/19/2021 ? ?Cellulitis of left knee ?- s/p debridement in ER with packing in place as well; same treatment as for left forearm, continue daily packing changes ? ?Cellulitis of left forearm ?-  s/p debridement in ER with packing in place; no further intervention recommended per orthopedic surgery ?- Continue packing changes daily ?- Continue vancomycin and Rocephin ? ?Thrombocytopenia ([place]) ?- Due to underlying malignancy and immunosuppression ?- Platelet count down to 20,000 this morning, no signs of bleeding.  If any further drop, will transfuse ? ?Chemotherapy-induced neutropenia (HCC) ?- ANC 28 today ?- continue neutropenic precautions ?- continue vanc and rocephin; if any worsening, will need to broaden ? ?AKI (acute kidney injury) (HCC)-resolved as of 05/18/2021 ?- baseline creatinine ~ 0.8 ?- patient presents with increase in creat >0.3 mg/dL above baseline, creat increase >1.5x baseline presumed to have occurred within past 7 days PTA ?-Creatinine 1.53 on admission ?- Presumed prerenal from acute illness and poor intake ?- S/p volume resuscitation ?- Renal function improved this morning ? ? ?HTN (hypertension) ?- Currently controlled without medication ? ? ?Old records reviewed in assessment of this patient ? ?Antimicrobials: ?Rocephin 5/10 >> current ?Vanc 5/10 >> current ? ?DVT prophylaxis:  ?SCDs Start: 05/17/21 1848 ? ? ?Code Status:   Code Status: Full Code ? ?Disposition Plan:  Pending PT/OT evals ?Status is: Inpt ? ?Objective: ?Blood pressure 111/73, pulse (!) 105, temperature 98.6 ?F (37 ?C), temperature source Oral, resp. rate 20, SpO2 100 %.  ?Examination:  ?Physical Exam ?Constitutional:   ?   Comments: More awake and alert today.  No distress.  ?HENT:  ?   Head: Normocephalic and atraumatic.  ?   Mouth/Throat:  ?   Mouth: Mucous membranes are moist.  ?Eyes:  ?  Extraocular Movements: Extraocular movements intact.  ?Cardiovascular:  ?   Rate and Rhythm: Normal rate and regular rhythm.  ?   Heart sounds: Normal heart sounds.  ?Pulmonary:  ?   Effort: Pulmonary effort is normal. No respiratory distress.  ?   Breath sounds: Normal breath sounds. No wheezing.  ?Abdominal:  ?   General: Bowel  sounds are normal. There is no distension.  ?   Palpations: Abdomen is soft.  ?   Tenderness: There is no abdominal tenderness.  ?Musculoskeletal:  ?   Cervical back: Normal range of motion and neck supple.  ?   Comments: Wound appreciated on left proximal forearm with packing in place and tender to palpation around debridement site with pitting edema and indurated skin.  Same appearance involving left lateral proximal leg near the knee with packing in place and surrounding tenderness to palpation  ?Skin: ?   General: Skin is warm and dry.  ?Neurological:  ?   Mental Status: Mental status is at baseline.  ?   Motor: Weakness present.  ?Psychiatric:     ?   Mood and Affect: Mood normal.  ?  ? ?Consultants:  ?General surgery ?Orthopedic surgery ? ?Procedures:  ? ? ?Data Reviewed: ?Results for orders placed or performed during the hospital encounter of 05/17/21 (from the past 24 hour(s))  ?Basic metabolic panel     Status: Abnormal  ? Collection Time: 05/19/21  3:50 AM  ?Result Value Ref Range  ? Sodium 138 135 - 145 mmol/L  ? Potassium 3.7 3.5 - 5.1 mmol/L  ? Chloride 107 98 - 111 mmol/L  ? CO2 23 22 - 32 mmol/L  ? Glucose, Bld 104 (H) 70 - 99 mg/dL  ? BUN 19 8 - 23 mg/dL  ? Creatinine, Ser 0.96 0.61 - 1.24 mg/dL  ? Calcium 8.2 (L) 8.9 - 10.3 mg/dL  ? GFR, Estimated >60 >60 mL/min  ? Anion gap 8 5 - 15  ?CBC with Differential/Platelet     Status: Abnormal  ? Collection Time: 05/19/21  3:50 AM  ?Result Value Ref Range  ? WBC 0.2 (LL) 4.0 - 10.5 K/uL  ? RBC 2.96 (L) 4.22 - 5.81 MIL/uL  ? Hemoglobin 8.4 (L) 13.0 - 17.0 g/dL  ? HCT 25.7 (L) 39.0 - 52.0 %  ? MCV 86.8 80.0 - 100.0 fL  ? MCH 28.4 26.0 - 34.0 pg  ? MCHC 32.7 30.0 - 36.0 g/dL  ? RDW 15.4 11.5 - 15.5 %  ? Platelets 20 (LL) 150 - 400 K/uL  ? nRBC 0.0 0.0 - 0.2 %  ? Neutrophils Relative % 5 %  ? Neutro Abs 0.0 (LL) 1.7 - 7.7 K/uL  ? Lymphocytes Relative 90 %  ? Lymphs Abs 0.2 (L) 0.7 - 4.0 K/uL  ? Monocytes Relative 5 %  ? Monocytes Absolute 0.0 (L) 0.1 - 1.0  K/uL  ? Eosinophils Relative 0 %  ? Eosinophils Absolute 0.0 0.0 - 0.5 K/uL  ? Basophils Relative 0 %  ? Basophils Absolute 0.0 0.0 - 0.1 K/uL  ? Immature Granulocytes 0 %  ? Abs Immature Granulocytes 0.00 0.00 - 0.07 K/uL  ?Magnesium     Status: Abnormal  ? Collection Time: 05/19/21  3:50 AM  ?Result Value Ref Range  ? Magnesium 1.6 (L) 1.7 - 2.4 mg/dL  ?Culture, blood (Routine X 2) w Reflex to ID Panel     Status: None (Preliminary result)  ? Collection Time: 05/19/21  3:50 AM  ? Specimen: BLOOD  ?  Result Value Ref Range  ? Specimen Description    ?  BLOOD RIGHT WRIST ?Performed at [facility], [place] [address] [place], [place] [address] ?  ? Special Requests    ?  BOTTLES DRAWN AEROBIC ONLY Blood Culture adequate volume ?Performed at [facility], [place] [address], [place], [place] [address] ?  ? Culture    ?  NO GROWTH <12 HOURS ?Performed at [facility], [place] [address] [place], [place] [address] ?  ? Report Status PENDING   ?Culture, blood (Routine X 2) w Reflex to ID Panel     Status: None (Preliminary result)  ? Collection Time: 05/19/21  3:50 AM  ? Specimen: BLOOD RIGHT HAND  ?Result Value Ref Range  ? Specimen Description    ?  BLOOD RIGHT HAND ?Performed at [facility], [place] [address] [place], [place] [address] ?  ? Special Requests    ?  BOTTLES DRAWN AEROBIC ONLY Blood Culture adequate volume ?Performed at [facility], [place] [address] [place], [place] [address] ?  ? Culture    ?  NO GROWTH <12 HOURS ?Performed at [facility], [place] [address], [place],  [address] ?  ? Report Status PENDING   ?  ?I have Reviewed nursing notes, Vitals, and Lab results since pt's last encounter. Pertinent lab results : see above ?I have ordered test including BMP, CBC, Mg ?I have reviewed the last note from staff over past 24 hours ?I have discussed pt's care plan and test results with nursing staff, case manager ? ? LOS: 2 days   ? ?Dwyane Dee, MD ?[facility] ?05/19/2021, 11:13 AM ?

## 2021-05-20 DIAGNOSIS — N179 Acute kidney failure, unspecified: Secondary | ICD-10-CM | POA: Diagnosis not present

## 2021-05-20 DIAGNOSIS — D701 Agranulocytosis secondary to cancer chemotherapy: Secondary | ICD-10-CM | POA: Diagnosis not present

## 2021-05-20 DIAGNOSIS — A4151 Sepsis due to Escherichia coli [E. coli]: Secondary | ICD-10-CM | POA: Diagnosis not present

## 2021-05-20 DIAGNOSIS — R7881 Bacteremia: Secondary | ICD-10-CM | POA: Diagnosis not present

## 2021-05-20 LAB — BASIC METABOLIC PANEL
Anion gap: 6 (ref 5–15)
BUN: 15 mg/dL (ref 8–23)
CO2: 25 mmol/L (ref 22–32)
Calcium: 7.9 mg/dL — ABNORMAL LOW (ref 8.9–10.3)
Chloride: 108 mmol/L (ref 98–111)
Creatinine, Ser: 0.79 mg/dL (ref 0.61–1.24)
GFR, Estimated: >60 mL/min (ref >60)
Glucose, Bld: 98 mg/dL (ref 70–99)
Potassium: 3.1 mmol/L — ABNORMAL LOW (ref 3.5–5.1)
Sodium: 139 mmol/L (ref 135–145)

## 2021-05-20 LAB — CBC WITH DIFFERENTIAL/PLATELET
Abs Immature Granulocytes: 0.01 10*3/uL (ref 0.00–0.07)
Basophils Absolute: 0 10*3/uL (ref 0.0–0.1)
Basophils Relative: 0 %
Eosinophils Absolute: 0 10*3/uL (ref 0.0–0.5)
Eosinophils Relative: 0 %
HCT: 21.5 % — ABNORMAL LOW (ref 39.0–52.0)
Hemoglobin: 7.2 g/dL — ABNORMAL LOW (ref 13.0–17.0)
Immature Granulocytes: 4 %
Lymphocytes Relative: 88 %
Lymphs Abs: 0.2 10*3/uL — ABNORMAL LOW (ref 0.7–4.0)
MCH: 28.6 pg (ref 26.0–34.0)
MCHC: 33.5 g/dL (ref 30.0–36.0)
MCV: 85.3 fL (ref 80.0–100.0)
Monocytes Absolute: 0 10*3/uL — ABNORMAL LOW (ref 0.1–1.0)
Monocytes Relative: 8 %
Neutro Abs: 0 10*3/uL — CL (ref 1.7–7.7)
Neutrophils Relative %: 0 %
Platelets: 32 10*3/uL — ABNORMAL LOW (ref 150–400)
RBC: 2.52 MIL/uL — ABNORMAL LOW (ref 4.22–5.81)
RDW: 15.2 % (ref 11.5–15.5)
WBC: 0.3 10*3/uL — CL (ref 4.0–10.5)
nRBC: 0 % (ref 0.0–0.2)

## 2021-05-20 LAB — PREPARE PLATELET PHERESIS: Unit division: 0

## 2021-05-20 LAB — BPAM PLATELET PHERESIS
Blood Product Expiration Date: 202305152359
ISSUE DATE / TIME: 202305121412
Unit Type and Rh: 5100

## 2021-05-20 LAB — CULTURE, BLOOD (ROUTINE X 2)

## 2021-05-20 LAB — MAGNESIUM: Magnesium: 2 mg/dL (ref 1.7–2.4)

## 2021-05-20 NOTE — Progress Notes (Signed)
?Progress Note ? ? ? ?Adam [name]   ?VOH:[id number]  ?DOB: 10-30-1955  ?DOA: 05/17/2021     3 ?PCP: de [place], [name], MD ? ?Initial CC: left forearm wound and left knee wound; falls ? ?Hospital Course: ?Adam [name] is a 66 y.o. male with PMH metastatic small cell lung cancer, HTN. Presented with multiple falls over the last week.  ?He had presented to the cancer center clinic for follow-up and was having swelling in his left knee and left forearm near his elbow.  He was referred to the ER from the cancer center for further evaluation.  In addition to his falls he has also appeared to be weaker per family on admission.  He underwent bedside debridement in the ER of his forearm and knee swelling with packing placed afterwards.  He was started on IV antibiotics.  After admission he was also found to be bacteremic with blood cultures growing E. coli. ?Orthopedic surgery was also consulted on admission for further evaluation of his underlying abscesses. ? ?Interval History:  ?No events overnight.  Resting in bed in no distress.  Still having pain in his left arm and knee but has been slowly improving. ? ?Assessment and Plan: ?* Sepsis (HCC)-resolved as of 05/19/2021 ?- Tachycardia, tachypnea, neutropenia, lactic acidosis; source presumed skin abscesses and bacteremia still of unknown origin ?- see bacteremia and cellulitis  ? ?Severe neutropenia (HCC) ?- Continue neutropenic precautions ?- Treating cellulitis with abscesses and bacteremia as noted ?- ANC 12 ?- Continue Granix ? ?E coli bacteremia ?- Blood cultures on admission growing E. Coli (? 2/3 bottles) ?- given immunosuppression would like to check repeat cultures for clearance  ?- continue rocephin  ?-Follow-up repeat cultures from 05/19/2021 (neg x 1 day) ? ?Cellulitis of left knee ?- s/p debridement in ER with packing in place as well; same treatment as for left forearm, continue daily packing changes ? ?Cellulitis of left forearm ?- s/p debridement in ER with  packing in place; no further intervention recommended per orthopedic surgery ?- Continue packing changes daily ?- Continue vancomycin and Rocephin ? ?Thrombocytopenia ([place]) ?- Due to underlying malignancy and immunosuppression ?- PLTC 20 k on 5/12 and received 1 pack platelets  ? ?Chemotherapy-induced neutropenia (HCC) ?- ANC 28 today ?- continue neutropenic precautions ?- continue vanc and rocephin; if any worsening, will need to broaden ? ?AKI (acute kidney injury) (HCC)-resolved as of 05/18/2021 ?- baseline creatinine ~ 0.8 ?- patient presents with increase in creat >0.3 mg/dL above baseline, creat increase >1.5x baseline presumed to have occurred within past 7 days PTA ?-Creatinine 1.53 on admission ?- Presumed prerenal from acute illness and poor intake ?- S/p volume resuscitation ?- Renal function improved this morning ? ? ?HTN (hypertension) ?- Currently controlled without medication ? ? ?Old records reviewed in assessment of this patient ? ?Antimicrobials: ?Rocephin 5/10 >> current ?Vanc 5/10 >> current ? ?DVT prophylaxis:  ?SCDs Start: 05/17/21 1848 ? ? ?Code Status:   Code Status: Full Code ? ?Disposition Plan:  Pending PT/OT evals ?Status is: Inpt ? ?Objective: ?Blood pressure 111/73, pulse (!) 107, temperature 99 ?F (37.2 ?C), temperature source Oral, resp. rate 18, height 5\' 9"  (1.753 m), weight 75.6 kg, SpO2 100 %.  ?Examination:  ?Physical Exam ?Constitutional:   ?   Comments: More awake and alert today.  No distress.  ?HENT:  ?   Head: Normocephalic and atraumatic.  ?   Mouth/Throat:  ?   Mouth: Mucous membranes are moist.  ?Eyes:  ?  Extraocular Movements: Extraocular movements intact.  ?Cardiovascular:  ?   Rate and Rhythm: Normal rate and regular rhythm.  ?   Heart sounds: Normal heart sounds.  ?Pulmonary:  ?   Effort: Pulmonary effort is normal. No respiratory distress.  ?   Breath sounds: Normal breath sounds. No wheezing.  ?Abdominal:  ?   General: Bowel sounds are normal. There is no  distension.  ?   Palpations: Abdomen is soft.  ?   Tenderness: There is no abdominal tenderness.  ?Musculoskeletal:  ?   Cervical back: Normal range of motion and neck supple.  ?   Comments: Wound appreciated on left proximal forearm with packing in place and tender to palpation around debridement site with pitting edema and indurated skin.  Same appearance involving left lateral proximal leg near the knee with packing in place and surrounding tenderness to palpation  ?Skin: ?   General: Skin is warm and dry.  ?Neurological:  ?   Mental Status: Mental status is at baseline.  ?   Motor: Weakness present.  ?Psychiatric:     ?   Mood and Affect: Mood normal.  ?  ? ?Consultants:  ?Orthopedic surgery ? ?Procedures:  ? ? ?Data Reviewed: ?Results for orders placed or performed during the hospital encounter of 05/17/21 (from the past 24 hour(s))  ?Basic metabolic panel     Status: Abnormal  ? Collection Time: 05/20/21  4:41 AM  ?Result Value Ref Range  ? Sodium 139 135 - 145 mmol/L  ? Potassium 3.1 (L) 3.5 - 5.1 mmol/L  ? Chloride 108 98 - 111 mmol/L  ? CO2 25 22 - 32 mmol/L  ? Glucose, Bld 98 70 - 99 mg/dL  ? BUN 15 8 - 23 mg/dL  ? Creatinine, Ser 0.79 0.61 - 1.24 mg/dL  ? Calcium 7.9 (L) 8.9 - 10.3 mg/dL  ? GFR, Estimated >60 >60 mL/min  ? Anion gap 6 5 - 15  ?CBC with Differential/Platelet     Status: Abnormal  ? Collection Time: 05/20/21  4:41 AM  ?Result Value Ref Range  ? WBC 0.3 (LL) 4.0 - 10.5 K/uL  ? RBC 2.52 (L) 4.22 - 5.81 MIL/uL  ? Hemoglobin 7.2 (L) 13.0 - 17.0 g/dL  ? HCT 21.5 (L) 39.0 - 52.0 %  ? MCV 85.3 80.0 - 100.0 fL  ? MCH 28.6 26.0 - 34.0 pg  ? MCHC 33.5 30.0 - 36.0 g/dL  ? RDW 15.2 11.5 - 15.5 %  ? Platelets 32 (L) 150 - 400 K/uL  ? nRBC 0.0 0.0 - 0.2 %  ? Neutrophils Relative % 0 %  ? Neutro Abs 0.0 (LL) 1.7 - 7.7 K/uL  ? Lymphocytes Relative 88 %  ? Lymphs Abs 0.2 (L) 0.7 - 4.0 K/uL  ? Monocytes Relative 8 %  ? Monocytes Absolute 0.0 (L) 0.1 - 1.0 K/uL  ? Eosinophils Relative 0 %  ? Eosinophils  Absolute 0.0 0.0 - 0.5 K/uL  ? Basophils Relative 0 %  ? Basophils Absolute 0.0 0.0 - 0.1 K/uL  ? Immature Granulocytes 4 %  ? Abs Immature Granulocytes 0.01 0.00 - 0.07 K/uL  ?Magnesium     Status: None  ? Collection Time: 05/20/21  4:41 AM  ?Result Value Ref Range  ? Magnesium 2.0 1.7 - 2.4 mg/dL  ?  ?I have Reviewed nursing notes, Vitals, and Lab results since pt's last encounter. Pertinent lab results : see above ?I have ordered test including BMP, CBC, Mg ?I have reviewed the  last note from staff over past 24 hours ?I have discussed pt's care plan and test results with nursing staff, case manager ? ? LOS: 3 days  ? ?Dwyane Dee, MD ?[facility] ?05/20/2021, 5:50 PM ?

## 2021-05-20 NOTE — TOC Progression Note (Addendum)
Transition of Care (TOC) - Progression Note  ? ? ?Patient Details  ?Name: Adam [name] ?MRN: [id number] ?Date of Birth: [birth year] ? ?Transition of Care (TOC) CM/SW Contact  ?Purcell Mouton, RN ?Phone Number: ?05/20/2021, 4:15 PM ? ?Clinical Narrative:    ? ?Pt from home alone. Will need to find a [facility] agency that contact with [place] for [facility].  ? ?Expected Discharge Plan: [place] ?Barriers to Discharge: No Barriers Identified ? ?Expected Discharge Plan and Services ?Expected Discharge Plan: [place] ?  ?  ?  ?Living arrangements for the past 2 months: [place] ?                ?  ?  ?  ?  ?  ?  ?  ?  ?  ?  ? ? ?Social Determinants of Health (SDOH) Interventions ?  ? ?Readmission Risk Interventions ?   ? View : No data to display.  ?  ?  ?  ? ? ?

## 2021-05-20 NOTE — Progress Notes (Signed)
Occupational Therapy Treatment ?Patient Details ?Name: Adam [name] ?MRN: [id number] ?DOB: Nov 18, 1955 ?Today's Date: 05/20/2021 ? ? ?History of present illness Adam [name] is a 66 y.o. male with medical history significant of metastatic small cell lung cancer, HTN. Presenting with multiple falls over the last week. Today presented to his [facility] clinic for regular follow up and with concerns of left knee and left forearm after falls at home. ?  ?OT comments ? Patient was seated in chair with leads off with wife reporting that he wanted to change clothes. Patient was educated on importance of not pulling off leads/lines for safety and using call light when needing something. Patient verbalized understanding. Patient was noted to have HR increase to 127bpm with minimal activity during session. Nurse made aware. Patient was noted to need increased cues for sequencing of tasks during session and increased assist for Ub dressing tasks compared to previous session notes. Patient and caregivers were educated on importance of patient completing tasks/attempting tasks first prior to asking for assistance to maintain functional strength. Patient and caregiver verbalized understanding. Patient would continue to benefit from skilled OT services at this time while admitted and after d/c to address noted deficits in order to improve overall safety and independence in ADLs.  ?  ? ?Recommendations for follow up therapy are one component of a multi-disciplinary discharge planning process, led by the attending physician.  Recommendations may be updated based on patient status, additional functional criteria and insurance authorization. ?   ?Follow Up Recommendations ? Home health OT  ?  ?Assistance Recommended at Discharge Frequent or constant Supervision/Assistance  ?Patient can return home with the following ? A little help with walking and/or transfers;A little help with bathing/dressing/bathroom;Assistance with cooking/housework;Direct  supervision/assist for financial management;Direct supervision/assist for medications management;Assist for transportation;Help with stairs or ramp for entrance ?  ?Equipment Recommendations ? BSC/3in1  ?  ?Recommendations for Other Services   ? ?  ?Precautions / Restrictions Precautions ?Precautions: Fall ?Precaution Comments: multiple falls, LUE and LLE wound ?Restrictions ?Weight Bearing Restrictions: No  ? ? ?  ? ?Mobility Bed Mobility ?  ?  ?  ?  ?  ?  ?  ?  ?  ? ?Transfers ?  ?  ?  ?  ?  ?  ?  ?  ?  ?  ?  ?  ?Balance Overall balance assessment: Mild deficits observed, not formally tested ?  ?  ?  ?  ?  ?  ?  ?  ?  ?  ?  ?  ?  ?  ?  ?  ?  ?  ?   ? ?ADL either performed or assessed with clinical judgement  ? ?ADL Overall ADL's : Needs assistance/impaired ?  ?  ?  ?  ?  ?  ?  ?  ?Upper Body Dressing : Minimal assistance ?Upper Body Dressing Details (indicate cue type and reason): with increased time and cues for patient to attempt to complete task prior to having someone complete for him. patient was slow to sequence things. ?Lower Body Dressing: Minimal assistance;Sit to/from stand ?Lower Body Dressing Details (indicate cue type and reason): with increased time to get feet through pants with patient not removing slippers first. patient was min guard for standing to pull pants up with HR increased to 127 bpm with activity nurse made aware. ?  ?  ?  ?  ?  ?  ?  ?General ADL Comments: patient was educated on importance of  sitting up during the day especially while visitors are present to engage in socailization. patient verbalized understanding and agreed to stay in recliner for longer with patient positioned in recliner to increase comfort. ?  ? ?Extremity/Trunk Assessment   ?  ?  ?  ?  ?  ? ?Vision   ?  ?  ?Perception   ?  ?Praxis   ?  ? ?Cognition Arousal/Alertness: Awake/alert ?Behavior During Therapy: Flat affect ?Overall Cognitive Status: Difficult to assess ?  ?  ?  ?  ?  ?  ?  ?  ?  ?  ?  ?  ?  ?  ?  ?   ?General Comments: patient was able to follow commands with increased time with increased cues. patient was noted to have difficulty with sequencing of tasks for ADLs that patient reported he wanted to do. patients significant other and mother were present for visit at end of session ?  ?  ?   ?Exercises   ? ?  ?Shoulder Instructions   ? ? ?  ?General Comments    ? ? ?Pertinent Vitals/ Pain       Pain Assessment ?Pain Assessment: No/denies pain ? ?Home Living   ?  ?  ?  ?  ?  ?  ?  ?  ?  ?  ?  ?  ?  ?  ?  ?  ?  ?  ? ?  ?Prior Functioning/Environment    ?  ?  ?  ?   ? ?Frequency ? Min 2X/week  ? ? ? ? ?  ?Progress Toward Goals ? ?OT Goals(current goals can now be found in the care plan section) ? Progress towards OT goals: Progressing toward goals ? ?   ?Plan Discharge plan remains appropriate   ? ?Co-evaluation ? ? ?   ?  ?  ?  ?  ? ?  ?AM-PAC OT "6 Clicks" Daily Activity     ?Outcome Measure ? ? Help from another person eating meals?: None ?Help from another person taking care of personal grooming?: A Little ?Help from another person toileting, which includes using toliet, bedpan, or urinal?: A Lot ?Help from another person bathing (including washing, rinsing, drying)?: A Little ?Help from another person to put on and taking off regular upper body clothing?: A Little ?Help from another person to put on and taking off regular lower body clothing?: A Little ?6 Click Score: 18 ? ?  ?End of Session Equipment Utilized During Treatment: Rolling walker (2 wheels) ? ?OT Visit Diagnosis: Unsteadiness on feet (R26.81) ?  ?Activity Tolerance Patient tolerated treatment well ?  ?Patient Left in chair;with call bell/phone within reach;with chair alarm set;with family/visitor present ?  ?Nurse Communication Mobility status;Other (comment) (HR during session) ?  ? ?   ? ?Time: 6962-9528 ?OT Time Calculation (min): 15 min ? ?Charges: OT General Charges ?$OT Visit: 1 Visit ?OT Treatments ?$Self Care/Home Management : 8-22  mins ? ?Roxana Lai OTR/L, MS ?Acute Rehabilitation Department ?Office# [phone] ?Pager# [phone] ? ? ?[place] ?05/20/2021, 4:15 PM ?

## 2021-05-21 DIAGNOSIS — A4151 Sepsis due to Escherichia coli [E. coli]: Secondary | ICD-10-CM | POA: Diagnosis not present

## 2021-05-21 DIAGNOSIS — N179 Acute kidney failure, unspecified: Secondary | ICD-10-CM | POA: Diagnosis not present

## 2021-05-21 DIAGNOSIS — L03116 Cellulitis of left lower limb: Secondary | ICD-10-CM | POA: Diagnosis not present

## 2021-05-21 DIAGNOSIS — L03114 Cellulitis of left upper limb: Secondary | ICD-10-CM | POA: Diagnosis not present

## 2021-05-21 LAB — CBC WITH DIFFERENTIAL/PLATELET
Abs Immature Granulocytes: 0 10*3/uL (ref 0.00–0.07)
Basophils Absolute: 0 10*3/uL (ref 0.0–0.1)
Basophils Relative: 0 %
Eosinophils Absolute: 0 10*3/uL (ref 0.0–0.5)
Eosinophils Relative: 0 %
HCT: 22.7 % — ABNORMAL LOW (ref 39.0–52.0)
Hemoglobin: 7.4 g/dL — ABNORMAL LOW (ref 13.0–17.0)
Immature Granulocytes: 0 %
Lymphocytes Relative: 73 %
Lymphs Abs: 0.3 10*3/uL — ABNORMAL LOW (ref 0.7–4.0)
MCH: 28 pg (ref 26.0–34.0)
MCHC: 32.6 g/dL (ref 30.0–36.0)
MCV: 86 fL (ref 80.0–100.0)
Monocytes Absolute: 0 10*3/uL — ABNORMAL LOW (ref 0.1–1.0)
Monocytes Relative: 9 %
Neutro Abs: 0.1 10*3/uL — CL (ref 1.7–7.7)
Neutrophils Relative %: 18 %
Platelets: 18 10*3/uL — CL (ref 150–400)
RBC: 2.64 MIL/uL — ABNORMAL LOW (ref 4.22–5.81)
RDW: 15.3 % (ref 11.5–15.5)
WBC: 0.3 10*3/uL — CL (ref 4.0–10.5)
nRBC: 0 % (ref 0.0–0.2)

## 2021-05-21 LAB — BASIC METABOLIC PANEL
Anion gap: 6 (ref 5–15)
BUN: 16 mg/dL (ref 8–23)
CO2: 25 mmol/L (ref 22–32)
Calcium: 8.2 mg/dL — ABNORMAL LOW (ref 8.9–10.3)
Chloride: 112 mmol/L — ABNORMAL HIGH (ref 98–111)
Creatinine, Ser: 0.73 mg/dL (ref 0.61–1.24)
GFR, Estimated: >60 mL/min (ref >60)
Glucose, Bld: 128 mg/dL — ABNORMAL HIGH (ref 70–99)
Potassium: 3 mmol/L — ABNORMAL LOW (ref 3.5–5.1)
Sodium: 143 mmol/L (ref 135–145)

## 2021-05-21 LAB — MAGNESIUM: Magnesium: 1.8 mg/dL (ref 1.7–2.4)

## 2021-05-21 MED ORDER — SODIUM CHLORIDE 0.9% IV SOLUTION: Freq: Once | INTRAVENOUS | Status: AC

## 2021-05-21 MED ORDER — POTASSIUM CHLORIDE CRYS ER 20 MEQ PO TBCR
40.0000 meq | EXTENDED_RELEASE_TABLET | ORAL | Status: AC
  Administered 2021-05-21 (×2): 40 meq via ORAL
  Filled 2021-05-21 (×2): qty 2

## 2021-05-21 MED ORDER — CEFAZOLIN SODIUM-DEXTROSE 2-4 GM/100ML-% IV SOLN
2.0000 g | Freq: Three times a day (TID) | INTRAVENOUS | Status: DC
Start: 2021-05-21 — End: 2021-05-29
  Administered 2021-05-21 – 2021-05-29 (×24): 2 g via INTRAVENOUS
  Filled 2021-05-21 (×26): qty 100

## 2021-05-21 NOTE — Progress Notes (Signed)
?Progress Note ? ? ? ?Adam [name]   ?WLN:[id number]  ?DOB: [birth year]  ?DOA: 05/17/2021     4 ?PCP: [place], [name], MD ? ?Initial CC: left forearm wound and left knee wound; falls ? ?Hospital Course: ?Adam [name] is a 66 y.o. male with PMH metastatic small cell lung cancer, HTN. Presented with multiple falls over the last week.  ?He had presented to the cancer center clinic for follow-up and was having swelling in his left knee and left forearm near his elbow.  He was referred to the ER from the cancer center for further evaluation.  In addition to his falls he has also appeared to be weaker per family on admission.  He underwent bedside debridement in the ER of his forearm and knee swelling with packing placed afterwards.  He was started on IV antibiotics.  After admission he was also found to be bacteremic with blood cultures growing E. coli. ?Orthopedic surgery was also consulted on admission for further evaluation of his underlying abscesses. ? ?Interval History:  ?No events overnight.  Resting comfortably in bed.  Still has improving pain. Lethargic but stable.  ? ?Assessment and Plan: ?* Sepsis (HCC)-resolved as of 05/19/2021 ?- Tachycardia, tachypnea, neutropenia, lactic acidosis; source presumed skin abscesses and bacteremia still of unknown origin ?- see bacteremia and cellulitis  ? ?Severe neutropenia (HCC) ?- Continue neutropenic precautions ?- Treating cellulitis with abscesses and bacteremia as noted ?- ANC 54 ?- Continue Granix ? ?E coli bacteremia ?- Blood cultures on admission growing E. Coli (? 2/3 bottles) ?- given immunosuppression would like to check repeat cultures for clearance  ?-Repeat cultures from 05/19/2021 negative at 48 hours. ?- Transition Rocephin to Ancef ? ?Cellulitis of left knee ?- s/p debridement in ER with packing in place as well; same treatment as for left forearm, continue daily packing changes ? ?Cellulitis of left forearm ?- s/p debridement in ER with packing in place; no  further intervention recommended per orthopedic surgery ?- Continue packing changes daily ?-De-escalate vancomycin and Rocephin down to Ancef ? ?Thrombocytopenia ([place]) ?- Due to underlying malignancy and immunosuppression ?- PLTC 20 k on 5/12 and received 1 pack platelets  ?- PLTC 18k on 5/14, transfuse 1 pack platelet  ? ?AKI (acute kidney injury) (HCC)-resolved as of 05/18/2021 ?- baseline creatinine ~ 0.8 ?- patient presents with increase in creat >0.3 mg/dL above baseline, creat increase >1.5x baseline presumed to have occurred within past 7 days PTA ?-Creatinine 1.53 on admission ?- Presumed prerenal from acute illness and poor intake ?- S/p volume resuscitation ?- Renal function improved this morning ? ? ?HTN (hypertension) ?- Currently controlled without medication ? ? ?Old records reviewed in assessment of this patient ? ?Antimicrobials: ?Rocephin 5/10 >> 5/14 ?Vanc 5/10 >> 5/14 ?Ancef 5/14 >> current  ? ?DVT prophylaxis:  ?SCDs Start: 05/17/21 1848 ? ? ?Code Status:   Code Status: Full Code ? ?Disposition Plan:  Home with [place] ?Status is: Inpt ? ?Objective: ?Blood pressure 118/72, pulse (!) 107, temperature 98.4 ?F (36.9 ?C), temperature source Oral, resp. rate 19, height 5\' 9"  (1.753 m), weight 73.5 kg, SpO2 100 %.  ?Examination:  ?Physical Exam ?Constitutional:   ?   Comments: More awake and alert today.  No distress.  ?HENT:  ?   Head: Normocephalic and atraumatic.  ?   Mouth/Throat:  ?   Mouth: Mucous membranes are moist.  ?Eyes:  ?   Extraocular Movements: Extraocular movements intact.  ?Cardiovascular:  ?   Rate  and Rhythm: Normal rate and regular rhythm.  ?   Heart sounds: Normal heart sounds.  ?Pulmonary:  ?   Effort: Pulmonary effort is normal. No respiratory distress.  ?   Breath sounds: Normal breath sounds. No wheezing.  ?Abdominal:  ?   General: Bowel sounds are normal. There is no distension.  ?   Palpations: Abdomen is soft.  ?   Tenderness: There is no abdominal tenderness.  ?Musculoskeletal:   ?   Cervical back: Normal range of motion and neck supple.  ?   Comments: Wound appreciated on left proximal forearm with packing in place and tender to palpation around debridement site with pitting edema and indurated skin.  Same appearance involving left lateral proximal leg near the knee with packing in place and surrounding tenderness to palpation  ?Skin: ?   General: Skin is warm and dry.  ?Neurological:  ?   Mental Status: Mental status is at baseline.  ?   Motor: Weakness present.  ?Psychiatric:     ?   Mood and Affect: Mood normal.  ?  ? ?Consultants:  ?Orthopedic surgery ? ?Procedures:  ? ? ?Data Reviewed: ?Results for orders placed or performed during the hospital encounter of 05/17/21 (from the past 24 hour(s))  ?Basic metabolic panel     Status: Abnormal  ? Collection Time: 05/21/21  4:57 AM  ?Result Value Ref Range  ? Sodium 143 135 - 145 mmol/L  ? Potassium 3.0 (L) 3.5 - 5.1 mmol/L  ? Chloride 112 (H) 98 - 111 mmol/L  ? CO2 25 22 - 32 mmol/L  ? Glucose, Bld 128 (H) 70 - 99 mg/dL  ? BUN 16 8 - 23 mg/dL  ? Creatinine, Ser 0.73 0.61 - 1.24 mg/dL  ? Calcium 8.2 (L) 8.9 - 10.3 mg/dL  ? GFR, Estimated >60 >60 mL/min  ? Anion gap 6 5 - 15  ?CBC with Differential/Platelet     Status: Abnormal  ? Collection Time: 05/21/21  4:57 AM  ?Result Value Ref Range  ? WBC 0.3 (LL) 4.0 - 10.5 K/uL  ? RBC 2.64 (L) 4.22 - 5.81 MIL/uL  ? Hemoglobin 7.4 (L) 13.0 - 17.0 g/dL  ? HCT 22.7 (L) 39.0 - 52.0 %  ? MCV 86.0 80.0 - 100.0 fL  ? MCH 28.0 26.0 - 34.0 pg  ? MCHC 32.6 30.0 - 36.0 g/dL  ? RDW 15.3 11.5 - 15.5 %  ? Platelets 18 (LL) 150 - 400 K/uL  ? nRBC 0.0 0.0 - 0.2 %  ? Neutrophils Relative % 18 %  ? Neutro Abs 0.1 (LL) 1.7 - 7.7 K/uL  ? Lymphocytes Relative 73 %  ? Lymphs Abs 0.3 (L) 0.7 - 4.0 K/uL  ? Monocytes Relative 9 %  ? Monocytes Absolute 0.0 (L) 0.1 - 1.0 K/uL  ? Eosinophils Relative 0 %  ? Eosinophils Absolute 0.0 0.0 - 0.5 K/uL  ? Basophils Relative 0 %  ? Basophils Absolute 0.0 0.0 - 0.1 K/uL  ? Immature  Granulocytes 0 %  ? Abs Immature Granulocytes 0.00 0.00 - 0.07 K/uL  ?Magnesium     Status: None  ? Collection Time: 05/21/21  4:57 AM  ?Result Value Ref Range  ? Magnesium 1.8 1.7 - 2.4 mg/dL  ?Prepare platelet pheresis     Status: None (Preliminary result)  ? Collection Time: 05/21/21  8:20 AM  ?Result Value Ref Range  ? Unit Number [id number]   ? Blood Component Type PLTP3 PSORALEN TREATED   ? Unit division  00   ? Status of Unit ALLOCATED   ? Transfusion Status    ?  OK TO TRANSFUSE ?Performed at [facility], [place] [address], [place], [place] [address] ?  ?  ?I have Reviewed nursing notes, Vitals, and Lab results since pt's last encounter. Pertinent lab results : see above ?I have ordered test including BMP, CBC, Mg ?I have reviewed the last note from staff over past 24 hours ?I have discussed pt's care plan and test results with nursing staff, case manager ? ? LOS: 4 days  ? ?Dwyane Dee, MD ?[facility] ?05/21/2021, 12:16 PM ?

## 2021-05-22 ENCOUNTER — Telehealth: Payer: Self-pay | Admitting: Medical Oncology

## 2021-05-22 DIAGNOSIS — L03116 Cellulitis of left lower limb: Secondary | ICD-10-CM | POA: Diagnosis not present

## 2021-05-22 DIAGNOSIS — D701 Agranulocytosis secondary to cancer chemotherapy: Secondary | ICD-10-CM | POA: Diagnosis not present

## 2021-05-22 DIAGNOSIS — L03114 Cellulitis of left upper limb: Secondary | ICD-10-CM | POA: Diagnosis not present

## 2021-05-22 DIAGNOSIS — D61818 Other pancytopenia: Secondary | ICD-10-CM | POA: Diagnosis not present

## 2021-05-22 DIAGNOSIS — R7881 Bacteremia: Secondary | ICD-10-CM | POA: Diagnosis not present

## 2021-05-22 DIAGNOSIS — D696 Thrombocytopenia, unspecified: Secondary | ICD-10-CM | POA: Diagnosis not present

## 2021-05-22 DIAGNOSIS — D709 Neutropenia, unspecified: Secondary | ICD-10-CM | POA: Diagnosis not present

## 2021-05-22 DIAGNOSIS — A4151 Sepsis due to Escherichia coli [E. coli]: Secondary | ICD-10-CM | POA: Diagnosis not present

## 2021-05-22 LAB — BASIC METABOLIC PANEL
Anion gap: 6 (ref 5–15)
BUN: 16 mg/dL (ref 8–23)
CO2: 25 mmol/L (ref 22–32)
Calcium: 8.2 mg/dL — ABNORMAL LOW (ref 8.9–10.3)
Chloride: 109 mmol/L (ref 98–111)
Creatinine, Ser: 0.6 mg/dL — ABNORMAL LOW (ref 0.61–1.24)
GFR, Estimated: >60 mL/min (ref >60)
Glucose, Bld: 117 mg/dL — ABNORMAL HIGH (ref 70–99)
Potassium: 3.2 mmol/L — ABNORMAL LOW (ref 3.5–5.1)
Sodium: 140 mmol/L (ref 135–145)

## 2021-05-22 LAB — CBC WITH DIFFERENTIAL/PLATELET
Abs Immature Granulocytes: 0.01 10*3/uL (ref 0.00–0.07)
Basophils Absolute: 0 10*3/uL (ref 0.0–0.1)
Basophils Relative: 0 %
Eosinophils Absolute: 0 10*3/uL (ref 0.0–0.5)
Eosinophils Relative: 0 %
HCT: 22.8 % — ABNORMAL LOW (ref 39.0–52.0)
Hemoglobin: 7.3 g/dL — ABNORMAL LOW (ref 13.0–17.0)
Immature Granulocytes: 2 %
Lymphocytes Relative: 50 %
Lymphs Abs: 0.2 10*3/uL — ABNORMAL LOW (ref 0.7–4.0)
MCH: 27.7 pg (ref 26.0–34.0)
MCHC: 32 g/dL (ref 30.0–36.0)
MCV: 86.4 fL (ref 80.0–100.0)
Monocytes Absolute: 0 10*3/uL — ABNORMAL LOW (ref 0.1–1.0)
Monocytes Relative: 10 %
Neutro Abs: 0.2 10*3/uL — CL (ref 1.7–7.7)
Neutrophils Relative %: 38 %
Platelets: 22 10*3/uL — CL (ref 150–400)
RBC: 2.64 MIL/uL — ABNORMAL LOW (ref 4.22–5.81)
RDW: 15.2 % (ref 11.5–15.5)
WBC: 0.4 10*3/uL — CL (ref 4.0–10.5)
nRBC: 0 % (ref 0.0–0.2)

## 2021-05-22 LAB — PREPARE PLATELET PHERESIS: Unit division: 0

## 2021-05-22 LAB — CULTURE, BLOOD (ROUTINE X 2)
Culture: NO GROWTH
Special Requests: ADEQUATE

## 2021-05-22 LAB — BPAM PLATELET PHERESIS
Blood Product Expiration Date: 202305142359
ISSUE DATE / TIME: 202305141542
Unit Type and Rh: 5100

## 2021-05-22 LAB — PREPARE RBC (CROSSMATCH)

## 2021-05-22 LAB — MAGNESIUM: Magnesium: 1.7 mg/dL (ref 1.7–2.4)

## 2021-05-22 MED ORDER — SODIUM CHLORIDE 0.9% IV SOLUTION: Freq: Once | INTRAVENOUS | Status: AC

## 2021-05-22 MED ORDER — POTASSIUM CHLORIDE CRYS ER 20 MEQ PO TBCR
40.0000 meq | EXTENDED_RELEASE_TABLET | Freq: Once | ORAL | Status: AC
  Administered 2021-05-22: 40 meq via ORAL
  Filled 2021-05-22: qty 2

## 2021-05-22 NOTE — Progress Notes (Signed)
?Progress Note ? ? ? ?Adam [name]   ?ZCH:[id number]  ?DOB: 11-08-1955  ?DOA: 05/17/2021     5 ?PCP: de [place], [name], MD ? ?Initial CC: left forearm wound and left knee wound; falls ? ?Hospital Course: ?Adam [name] is a 66 y.o. male with PMH metastatic small cell lung cancer, HTN. Presented with multiple falls over the last week.  ?He had presented to the cancer center clinic for follow-up and was having swelling in his left knee and left forearm near his elbow.  He was referred to the ER from the cancer center for further evaluation.  In addition to his falls he has also appeared to be weaker per family on admission.  He underwent bedside debridement in the ER of his forearm and knee swelling with packing placed afterwards.  He was started on IV antibiotics.  After admission he was also found to be bacteremic with blood cultures growing E. coli. ?Orthopedic surgery was also consulted on admission for further evaluation of his underlying abscesses. ? ?Interval History:  ?No events overnight.  Resting comfortably in bed.  Still has improving pain. Lethargic but stable.  ?Updated patient on his labs. He was doing okay and hoping to get out of bed today.  ? ?Assessment and Plan: ?* Sepsis (HCC)-resolved as of 05/19/2021 ?- Tachycardia, tachypnea, neutropenia, lactic acidosis; source presumed skin abscesses and bacteremia still of unknown origin ?- see bacteremia and cellulitis  ? ?Severe neutropenia (HCC) ?- Continue neutropenic precautions ?- Treating cellulitis with abscesses and bacteremia as noted ?- ANC 160 (slowly improving) ?- Continue Granix ? ?E coli bacteremia ?- Blood cultures on admission growing E. Coli (? 2/3 bottles) ?- given immunosuppression would like to check repeat cultures for clearance  ?-Repeat cultures from 05/19/2021 negative at 48 hours. ?- Transition Rocephin to Ancef ? ?Cellulitis of left knee ?- s/p debridement in ER with packing in place as well; same treatment as for left forearm, continue  daily packing changes ? ?Cellulitis of left forearm ?- s/p debridement in ER with packing in place; no further intervention recommended per orthopedic surgery ?- Continue packing changes daily ?-De-escalate vancomycin and Rocephin down to Ancef ? ?Thrombocytopenia ([place]) ?- Due to underlying malignancy and immunosuppression ?- PLTC 20 k on 5/12 and received 1 pack platelets  ?- PLTC 18k on 5/14, transfuse 1 pack platelet  ? ?AKI (acute kidney injury) (HCC)-resolved as of 05/18/2021 ?- baseline creatinine ~ 0.8 ?- patient presents with increase in creat >0.3 mg/dL above baseline, creat increase >1.5x baseline presumed to have occurred within past 7 days PTA ?-Creatinine 1.53 on admission ?- Presumed prerenal from acute illness and poor intake ?- S/p volume resuscitation ?- Renal function improved this morning ? ? ?HTN (hypertension) ?- Currently controlled without medication ? ? ?Old records reviewed in assessment of this patient ? ?Antimicrobials: ?Rocephin 5/10 >> 5/14 ?Vanc 5/10 >> 5/14 ?Ancef 5/14 >> current  ? ?DVT prophylaxis:  ?SCDs Start: 05/17/21 1848 ? ? ?Code Status:   Code Status: Full Code ? ?Disposition Plan:  Home with [place] ?Status is: Inpt ? ?Objective: ?Blood pressure 106/79, pulse (!) 119, temperature 99.1 ?F (37.3 ?C), temperature source Oral, resp. rate 20, height 5\' 9"  (1.753 m), weight 73.5 kg, SpO2 100 %.  ?Examination:  ?Physical Exam ?Constitutional:   ?   Comments: More awake and alert today.  No distress.  ?HENT:  ?   Head: Normocephalic and atraumatic.  ?   Mouth/Throat:  ?   Mouth: Mucous membranes  are moist.  ?Eyes:  ?   Extraocular Movements: Extraocular movements intact.  ?Cardiovascular:  ?   Rate and Rhythm: Normal rate and regular rhythm.  ?   Heart sounds: Normal heart sounds.  ?Pulmonary:  ?   Effort: Pulmonary effort is normal. No respiratory distress.  ?   Breath sounds: Normal breath sounds. No wheezing.  ?Abdominal:  ?   General: Bowel sounds are normal. There is no distension.  ?    Palpations: Abdomen is soft.  ?   Tenderness: There is no abdominal tenderness.  ?Musculoskeletal:  ?   Cervical back: Normal range of motion and neck supple.  ?   Comments: Wound appreciated on left proximal forearm with packing in place and tender to palpation around debridement site with pitting edema and indurated skin and a small forming blister near packing site.  Same appearance involving left lateral proximal leg near the knee with packing in place and surrounding tenderness to palpation but improving  ?Skin: ?   General: Skin is warm and dry.  ?Neurological:  ?   Mental Status: Mental status is at baseline.  ?   Motor: Weakness present.  ?Psychiatric:     ?   Mood and Affect: Mood normal.  ?  ? ?Consultants:  ?Orthopedic surgery ? ?Procedures:  ? ? ?Data Reviewed: ?Results for orders placed or performed during the hospital encounter of 05/17/21 (from the past 24 hour(s))  ?Basic metabolic panel     Status: Abnormal  ? Collection Time: 05/22/21  4:44 AM  ?Result Value Ref Range  ? Sodium 140 135 - 145 mmol/L  ? Potassium 3.2 (L) 3.5 - 5.1 mmol/L  ? Chloride 109 98 - 111 mmol/L  ? CO2 25 22 - 32 mmol/L  ? Glucose, Bld 117 (H) 70 - 99 mg/dL  ? BUN 16 8 - 23 mg/dL  ? Creatinine, Ser 0.60 (L) 0.61 - 1.24 mg/dL  ? Calcium 8.2 (L) 8.9 - 10.3 mg/dL  ? GFR, Estimated >60 >60 mL/min  ? Anion gap 6 5 - 15  ?CBC with Differential/Platelet     Status: Abnormal  ? Collection Time: 05/22/21  4:44 AM  ?Result Value Ref Range  ? WBC 0.4 (LL) 4.0 - 10.5 K/uL  ? RBC 2.64 (L) 4.22 - 5.81 MIL/uL  ? Hemoglobin 7.3 (L) 13.0 - 17.0 g/dL  ? HCT 22.8 (L) 39.0 - 52.0 %  ? MCV 86.4 80.0 - 100.0 fL  ? MCH 27.7 26.0 - 34.0 pg  ? MCHC 32.0 30.0 - 36.0 g/dL  ? RDW 15.2 11.5 - 15.5 %  ? Platelets 22 (LL) 150 - 400 K/uL  ? nRBC 0.0 0.0 - 0.2 %  ? Neutrophils Relative % 38 %  ? Neutro Abs 0.2 (LL) 1.7 - 7.7 K/uL  ? Lymphocytes Relative 50 %  ? Lymphs Abs 0.2 (L) 0.7 - 4.0 K/uL  ? Monocytes Relative 10 %  ? Monocytes Absolute 0.0 (L) 0.1 -  1.0 K/uL  ? Eosinophils Relative 0 %  ? Eosinophils Absolute 0.0 0.0 - 0.5 K/uL  ? Basophils Relative 0 %  ? Basophils Absolute 0.0 0.0 - 0.1 K/uL  ? Immature Granulocytes 2 %  ? Abs Immature Granulocytes 0.01 0.00 - 0.07 K/uL  ?Magnesium     Status: None  ? Collection Time: 05/22/21  4:44 AM  ?Result Value Ref Range  ? Magnesium 1.7 1.7 - 2.4 mg/dL  ?Type and screen [place]     Status: None (  Preliminary result)  ? Collection Time: 05/22/21 10:09 AM  ?Result Value Ref Range  ? ABO/RH(D) O POS   ? Antibody Screen NEG   ? Sample Expiration 05/25/2021,2359   ? Unit Number [id number]   ? Blood Component Type RED CELLS,LR   ? Unit division 00   ? Status of Unit ALLOCATED   ? Transfusion Status OK TO TRANSFUSE   ? Crossmatch Result    ?  Compatible ?Performed at [facility], [place] [address], [place], [place] [address] ?  ?Prepare RBC (crossmatch)     Status: None  ? Collection Time: 05/22/21 10:09 AM  ?Result Value Ref Range  ? Order Confirmation    ?  ORDER PROCESSED BY BLOOD BANK ?Performed at [facility], [place] [address], [place], [place] [address] ?  ?  ?I have Reviewed nursing notes, Vitals, and Lab results since pt's last encounter. Pertinent lab results : see above ?I have ordered test including BMP, CBC, Mg ?I have reviewed the last note from staff over past 24 hours ?I have discussed pt's care plan and test results with nursing staff, case manager ? ? LOS: 5 days  ? ?Dwyane Dee, MD ?[facility] ?05/22/2021, 2:46 PM ?

## 2021-05-22 NOTE — Progress Notes (Signed)
HEMATOLOGY-ONCOLOGY PROGRESS NOTE ? 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  He is receiving palliative systemic chemotherapy with Zepzelca 3.2 mg per metered squared every 3 weeks.  Status post 2 cycles.  Last cycle was given on 05/10/2021. ? ?Now admitted with sepsis secondary to cellulitis/abscess.  He is receiving IV antibiotics for E. coli bacteremia and cellulitis.  Repeat blood cultures are negative to date.  He has been afebrile. ? ?He continues to have pancytopenia due to recent chemotherapy and sepsis.  We will plan to continue supportive care with Granix 480 mcg subcu daily until ANC is 1.5 or higher.  His white blood cell count is slowly improving.  Hemoglobin 7.3 this morning.  We will give 1 unit PRBCs.  Recommend PRBC transfusion for hemoglobin less than 8.  Platelets are 22,000 this morning.  Will hold off on platelet transfusion.  Recommend platelet transfusion for platelet count less than 20,000 or active bleeding. ? ?Mikey Bussing, DNP, AGPCNP-BC, AOCNP ? ?The total time spent in the visit was 40 minutes. Greater than 50%  of this time was spent counseling and coordinating care related to the above assessment and plan.  ? ?SUBJECTIVE: No new complaints this morning.  No bleeding reported.  Repeat blood cultures are negative to date. ? ?Oncology History Overview Note  ?He is currently on the following trial. Please refer to my original note for complete  history. ? 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 ?C1 D1 02/16/2020-02/18/2020 ?He has consented for the trial and is enrolled to the arm without immunotherapy. ?C2 D1-D3 03/15/2020-03/17/2020 ?C3D1 completed 04/05/2020 through 04/07/2020 ?C4D1 delayed by a week because of neutropenia. ?C4D1 completed 4/26-22-05/05/2020 ?  ?Primary cancer of right upper lobe of lung ([place])  ?02/06/2020 Initial Diagnosis  ? Primary cancer of right upper lobe of lung ([place]) ?  ?02/10/2020 Cancer Staging  ? Staging form: Lung, AJCC 8th Edition ?- Pathologic stage from 02/10/2020: No Stage Recommended (cT1c, cN2, cM0) - Signed by Benay Pike, MD on 02/10/2020 ?Histopathologic type: Small cell carcinoma, NOS ?Stage prefix: Initial diagnosis ?Histologic grade (G): GX ?Histologic grading system: 4 grade system ?  ?Small cell lung cancer, 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[place])  ?02/10/2020 Initial Diagnosis  ? Small cell lung cancer in adult [facility]) ?  ?02/16/2020 - 02/18/2020 Chemotherapy  ?  ? ?  ?  ?03/15/2020 - 05/05/2020 Chemotherapy  ? Patient is on Treatment Plan : LUNG SMALL CELL - LIMITED STAGE RESEARCH [id number] CISPLATIN D1 / ETOPOSIDE D1-3 ARM 1  ?   ?12/14/2020 - 03/15/2021 Chemotherapy  ? Patient is on Treatment Plan : LUNG SMALL CELL EXTENSIVE STAGE Durvalumab + Carboplatin D1 + Etoposide D1-3 q21d x 4 Cycles / Durvalumab q28d  ?   ?04/19/2021 -  Chemotherapy  ? Patient is on Treatment Plan : LUNG SMALL CELL Lurbinectedin q21d  ?   ? ? ? ?REVIEW OF SYSTEMS:   ?Review of Systems  ?  Constitutional:  Negative for fever.  ?HENT: Negative.    ?Eyes: Negative.   ?Respiratory: Negative.    ?Cardiovascular: Negative.   ?Gastrointestinal: Negative.   ?Skin:   ?     Draining wounds to his left knee and left elbow  ?Neurological: Negative.   ?Endo/Heme/Allergies:  Does not bruise/bleed easily.  ?Psychiatric/Behavioral: Negative.    ? ?I have reviewed the past  medical history, past surgical history, social history and family history with the patient and they are unchanged from previous note. ? ? ?PHYSICAL EXAMINATION: ?ECOG PERFORMANCE STATUS: 2 - Symptomatic, <50% confined to bed ? ?Vitals:  ? 05/21/21 2158 05/22/21 0539  ?BP: 129/82 106/79  ?Pulse: (!) 113 (!) 119  ?Resp: 20 20  ?Temp: 98.7 ?F (37.1 ?C) 99.1 ?F (37.3 ?C)  ?SpO2: 100% 100%  ? ?Filed Weights  ? 05/20/21 1101 05/21/21 0608  ?Weight: 75.6 kg 73.5 kg  ? ? ?Intake/Output from previous day: ?05/14 0701 - 05/15 0700 ?In: 503 [I.V.:25; Blood:278; IV IWOEHOZYY:482] ?Out: 950 [Urine:950] ? ?Physical Exam ?Vitals reviewed.  ?Constitutional:   ?   General: He is not in acute distress. ?HENT:  ?   Head: Normocephalic.  ?Eyes:  ?   General: No scleral icterus. ?Cardiovascular:  ?   Rate and Rhythm: Tachycardia present.  ?Pulmonary:  ?   Effort: Pulmonary effort is normal. No respiratory distress.  ?Skin: ?   Comments: Left knee and left elbow wounds noted to have purulent drainage.  ?Neurological:  ?   Mental Status: He is alert and oriented to person, place, and time.  ? ? ?LABORATORY DATA:  ?I have reviewed the data as listed ? ?  Latest Ref Rng & Units 05/22/2021  ?  4:44 AM 05/21/2021  ?  4:57 AM 05/20/2021  ?  4:41 AM  ?CMP  ?Glucose 70 - 99 mg/dL 117   128   98    ?BUN 8 - 23 mg/dL 16   16   15     ?Creatinine 0.61 - 1.24 mg/dL 0.60   0.73   0.79    ?Sodium 135 - 145 mmol/L 140   143   139    ?Potassium 3.5 - 5.1 mmol/L 3.2   3.0   3.1    ?Chloride 98 - 111 mmol/L 109   112   108    ?CO2 22 - 32 mmol/L 25   25   25     ?Calcium 8.9 - 10.3 mg/dL 8.2   8.2   7.9    ? ? ?Lab Results  ?Component Value Date  ? WBC 0.4 (LL) 05/22/2021  ? HGB 7.3 (L) 05/22/2021  ? HCT 22.8 (L) 05/22/2021  ? MCV 86.4 05/22/2021  ? PLT 22 (LL) 05/22/2021  ? NEUTROABS 0.2 (LL) 05/22/2021  ? ? ?No results found for: CEA1, CEA, [id number], CA125, PSA1 ? ?DG Chest 2 View ? ?Result Date: 05/17/2021 ?CLINICAL DATA:  Suspected sepsis.  History of lung  cancer. EXAM: CHEST - 2 VIEW COMPARISON:  04/07/2021 CT FINDINGS: Right Port-A-Cath tip at superior caval/atrial junction. Numerous leads and wires project over the chest. Tracheal deviation right. Right hemidiaphragm elevation. Normal heart size. No pleural fluid. 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 Radiation change within the right suprahilar region and paramediastinal lung. No superimposed lobar consolidation. IMPRESSION: No evidence of pneumonia. 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 Emphysema (ICD10-J43.9). Electronically Signed  By: Abigail Miyamoto M.D.   On: 05/17/2021 12:12  ? ?DG Forearm Left ? ?Result Date: 05/17/2021 ?CLINICAL DATA:  Left forearm wound after fall several weeks ago. EXAM: LEFT FOREARM - 2 VIEW COMPARISON:  None Available. FINDINGS: There is no evidence of fracture or other focal bone lesions. Large amount of soft tissue gas is seen around proximal forearm. Some high density material is noted within the soft tissues which may represent either surgical packing or other foreign body. IMPRESSION: No fracture or dislocation is noted. Large amount of soft tissue gas seen in proximal forearm consistent with laceration or other wounds. High density material is seen in these areas which may represent surgical packing or other foreign body; clinical correlation is recommended Electronically Signed   By: Marijo Conception M.D.   On: 05/17/2021 20:41  ? ?DG Knee 1-2 Views Left ? ?Result Date: 05/17/2021 ?CLINICAL DATA:  Left knee wound after fall. EXAM: LEFT KNEE - 1-2 VIEW COMPARISON:  None Available. FINDINGS: No evidence of fracture, dislocation, or joint effusion. No evidence of arthropathy. Mild patellar spurring is noted. Vascular calcifications are noted. Soft tissue gas is noted lateral to the joint space consistent with laceration. IMPRESSION: Soft tissue gas noted lateral to  the joint space consistent with laceration. No fracture or dislocation is noted. Electronically Signed   By: Marijo Conception M.D.   On: 05/17/2021 20:38  ? ?US RENAL ? ?Result Date: 05/17/2021 ?CLINICAL DATA

## 2021-05-22 NOTE — Telephone Encounter (Signed)
LVM for Adam [name] to return my call . ?She is trying to find resources for pt prior to discharge. ? ?

## 2021-05-23 DIAGNOSIS — R7881 Bacteremia: Secondary | ICD-10-CM | POA: Diagnosis not present

## 2021-05-23 DIAGNOSIS — L03116 Cellulitis of left lower limb: Secondary | ICD-10-CM | POA: Diagnosis not present

## 2021-05-23 DIAGNOSIS — A4151 Sepsis due to Escherichia coli [E. coli]: Secondary | ICD-10-CM | POA: Diagnosis not present

## 2021-05-23 DIAGNOSIS — L03114 Cellulitis of left upper limb: Secondary | ICD-10-CM | POA: Diagnosis not present

## 2021-05-23 LAB — CBC WITH DIFFERENTIAL/PLATELET
Abs Immature Granulocytes: 0 10*3/uL (ref 0.00–0.07)
Basophils Absolute: 0 10*3/uL (ref 0.0–0.1)
Basophils Relative: 2 %
Eosinophils Absolute: 0 10*3/uL (ref 0.0–0.5)
Eosinophils Relative: 0 %
HCT: 23.5 % — ABNORMAL LOW (ref 39.0–52.0)
Hemoglobin: 7.7 g/dL — ABNORMAL LOW (ref 13.0–17.0)
Immature Granulocytes: 0 %
Lymphocytes Relative: 46 %
Lymphs Abs: 0.3 10*3/uL — ABNORMAL LOW (ref 0.7–4.0)
MCH: 28.4 pg (ref 26.0–34.0)
MCHC: 32.8 g/dL (ref 30.0–36.0)
MCV: 86.7 fL (ref 80.0–100.0)
Monocytes Absolute: 0.1 10*3/uL (ref 0.1–1.0)
Monocytes Relative: 8 %
Neutro Abs: 0.3 10*3/uL — CL (ref 1.7–7.7)
Neutrophils Relative %: 44 %
Platelets: 11 10*3/uL — CL (ref 150–400)
RBC: 2.71 MIL/uL — ABNORMAL LOW (ref 4.22–5.81)
RDW: 15.4 % (ref 11.5–15.5)
WBC: 0.6 10*3/uL — CL (ref 4.0–10.5)
nRBC: 0 % (ref 0.0–0.2)

## 2021-05-23 LAB — BASIC METABOLIC PANEL
Anion gap: 5 (ref 5–15)
BUN: 16 mg/dL (ref 8–23)
CO2: 26 mmol/L (ref 22–32)
Calcium: 8.2 mg/dL — ABNORMAL LOW (ref 8.9–10.3)
Chloride: 113 mmol/L — ABNORMAL HIGH (ref 98–111)
Creatinine, Ser: 0.64 mg/dL (ref 0.61–1.24)
GFR, Estimated: >60 mL/min (ref >60)
Glucose, Bld: 102 mg/dL — ABNORMAL HIGH (ref 70–99)
Potassium: 3.5 mmol/L (ref 3.5–5.1)
Sodium: 144 mmol/L (ref 135–145)

## 2021-05-23 LAB — MAGNESIUM: Magnesium: 1.6 mg/dL — ABNORMAL LOW (ref 1.7–2.4)

## 2021-05-23 MED ORDER — POTASSIUM CHLORIDE 20 MEQ PO PACK
40.0000 meq | PACK | Freq: Once | ORAL | Status: AC
  Administered 2021-05-23: 40 meq via ORAL
  Filled 2021-05-23: qty 2

## 2021-05-23 MED ORDER — SODIUM CHLORIDE 0.9% IV SOLUTION: Freq: Once | INTRAVENOUS | Status: AC

## 2021-05-23 MED ORDER — MAGNESIUM SULFATE 2 GM/50ML IV SOLN
2.0000 g | Freq: Once | INTRAVENOUS | Status: AC
  Administered 2021-05-23: 2 g via INTRAVENOUS
  Filled 2021-05-23: qty 50

## 2021-05-23 NOTE — Progress Notes (Signed)
?Progress Note ? ? ? ?Adam [name]   ?CBS:[id number]  ?DOB: [birth year]  ?DOA: 05/17/2021     6 ?PCP: de [place], [name], MD ? ?Initial CC: left forearm wound and left knee wound; falls ? ?Hospital Course: ?Adam [name] is a 66 y.o. male with PMH metastatic small cell lung cancer, HTN. Presented with multiple falls over the last week.  ?He had presented to the cancer center clinic for follow-up and was having swelling in his left knee and left forearm near his elbow.  He was referred to the ER from the cancer center for further evaluation.  In addition to his falls he has also appeared to be weaker per family on admission.  He underwent bedside debridement in the ER of his forearm and knee swelling with packing placed afterwards.  He was started on IV antibiotics.  After admission he was also found to be bacteremic with blood cultures growing E. coli. ?Orthopedic surgery was also consulted on admission for further evaluation of his underlying abscesses. ? ?Interval History:  ?No events overnight.  No distress and resting comfortably in bed. ? ?Assessment and Plan: ?* Sepsis (HCC)-resolved as of 05/19/2021 ?- Tachycardia, tachypnea, neutropenia, lactic acidosis; source presumed skin abscesses and bacteremia still of unknown origin ?- see bacteremia and cellulitis  ? ?Severe neutropenia (HCC) ?- Continue neutropenic precautions ?- Treating cellulitis with abscesses and bacteremia as noted ?- ANC 264 (slowly improving) ?- Continue Granix ? ?E coli bacteremia ?- Blood cultures on admission growing E. Coli (? 2/3 bottles) ?- given immunosuppression would like to check repeat cultures for clearance  ?-Repeat cultures from 05/19/2021 negative at 48 hours. ?- Transition Rocephin to Ancef ? ?Cellulitis of left knee ?- s/p debridement in ER with packing in place as well; same treatment as for left forearm, continue daily packing changes ? ?Cellulitis of left forearm ?- s/p debridement in ER with packing in place; no further  intervention recommended per orthopedic surgery ?- Continue packing changes daily ?-De-escalate vancomycin and Rocephin down to Ancef ? ?Thrombocytopenia ([place]) ?- Due to underlying malignancy and immunosuppression ?-Per oncology, transfuse if PLTC < 20k ?- PLTC 20 k on 5/12 and received 1 pack platelets  ?- PLTC 18k on 5/14, transfuse 1 pack platelet  ?- PLTC 11k on 5/16, transfuse 1 pack platelet ? ?AKI (acute kidney injury) (HCC)-resolved as of 05/18/2021 ?- baseline creatinine ~ 0.8 ?- patient presents with increase in creat >0.3 mg/dL above baseline, creat increase >1.5x baseline presumed to have occurred within past 7 days PTA ?-Creatinine 1.53 on admission ?- Presumed prerenal from acute illness and poor intake ?- S/p volume resuscitation ?- Renal function improved this morning ? ? ?HTN (hypertension) ?- Currently controlled without medication ? ? ?Old records reviewed in assessment of this patient ? ?Antimicrobials: ?Rocephin 5/10 >> 5/14 ?Vanc 5/10 >> 5/14 ?Ancef 5/14 >> current  ? ?DVT prophylaxis:  ?SCDs Start: 05/17/21 1848 ? ? ?Code Status:   Code Status: Full Code ? ?Disposition Plan:  Home with [place] ?Status is: Inpt ? ?Objective: ?Blood pressure 115/76, pulse (!) 109, temperature 99.3 ?F (37.4 ?C), temperature source Oral, resp. rate 18, height 5\' 9"  (1.753 m), weight 73.5 kg, SpO2 100 %.  ?Examination:  ?Physical Exam ?Constitutional:   ?   Comments: More awake and alert today.  No distress.  ?HENT:  ?   Head: Normocephalic and atraumatic.  ?   Mouth/Throat:  ?   Mouth: Mucous membranes are moist.  ?Eyes:  ?  Extraocular Movements: Extraocular movements intact.  ?Cardiovascular:  ?   Rate and Rhythm: Normal rate and regular rhythm.  ?   Heart sounds: Normal heart sounds.  ?Pulmonary:  ?   Effort: Pulmonary effort is normal. No respiratory distress.  ?   Breath sounds: Normal breath sounds. No wheezing.  ?Abdominal:  ?   General: Bowel sounds are normal. There is no distension.  ?   Palpations: Abdomen  is soft.  ?   Tenderness: There is no abdominal tenderness.  ?Musculoskeletal:  ?   Cervical back: Normal range of motion and neck supple.  ?   Comments: Wound appreciated on left proximal forearm with packing in place and tender to palpation around debridement site with pitting edema and indurated skin and a small forming blister near packing site.  Same appearance involving left lateral proximal leg near the knee with packing in place and surrounding tenderness to palpation but improving  ?Skin: ?   General: Skin is warm and dry.  ?Neurological:  ?   Mental Status: Mental status is at baseline.  ?   Motor: Weakness present.  ?Psychiatric:     ?   Mood and Affect: Mood normal.  ?  ? ?Consultants:  ?Orthopedic surgery ? ?Procedures:  ? ? ?Data Reviewed: ?Results for orders placed or performed during the hospital encounter of 05/17/21 (from the past 24 hour(s))  ?Basic metabolic panel     Status: Abnormal  ? Collection Time: 05/23/21  3:47 AM  ?Result Value Ref Range  ? Sodium 144 135 - 145 mmol/L  ? Potassium 3.5 3.5 - 5.1 mmol/L  ? Chloride 113 (H) 98 - 111 mmol/L  ? CO2 26 22 - 32 mmol/L  ? Glucose, Bld 102 (H) 70 - 99 mg/dL  ? BUN 16 8 - 23 mg/dL  ? Creatinine, Ser 0.64 0.61 - 1.24 mg/dL  ? Calcium 8.2 (L) 8.9 - 10.3 mg/dL  ? GFR, Estimated >60 >60 mL/min  ? Anion gap 5 5 - 15  ?CBC with Differential/Platelet     Status: Abnormal  ? Collection Time: 05/23/21  3:47 AM  ?Result Value Ref Range  ? WBC 0.6 (LL) 4.0 - 10.5 K/uL  ? RBC 2.71 (L) 4.22 - 5.81 MIL/uL  ? Hemoglobin 7.7 (L) 13.0 - 17.0 g/dL  ? HCT 23.5 (L) 39.0 - 52.0 %  ? MCV 86.7 80.0 - 100.0 fL  ? MCH 28.4 26.0 - 34.0 pg  ? MCHC 32.8 30.0 - 36.0 g/dL  ? RDW 15.4 11.5 - 15.5 %  ? Platelets 11 (LL) 150 - 400 K/uL  ? nRBC 0.0 0.0 - 0.2 %  ? Neutrophils Relative % 44 %  ? Neutro Abs 0.3 (LL) 1.7 - 7.7 K/uL  ? Lymphocytes Relative 46 %  ? Lymphs Abs 0.3 (L) 0.7 - 4.0 K/uL  ? Monocytes Relative 8 %  ? Monocytes Absolute 0.1 0.1 - 1.0 K/uL  ? Eosinophils  Relative 0 %  ? Eosinophils Absolute 0.0 0.0 - 0.5 K/uL  ? Basophils Relative 2 %  ? Basophils Absolute 0.0 0.0 - 0.1 K/uL  ? WBC Morphology DOHLE BODIES   ? Immature Granulocytes 0 %  ? Abs Immature Granulocytes 0.00 0.00 - 0.07 K/uL  ? Ovalocytes PRESENT   ?Magnesium     Status: Abnormal  ? Collection Time: 05/23/21  3:47 AM  ?Result Value Ref Range  ? Magnesium 1.6 (L) 1.7 - 2.4 mg/dL  ?Prepare platelet pheresis     Status: None (Preliminary  result)  ? Collection Time: 05/23/21  8:26 AM  ?Result Value Ref Range  ? Unit Number [id number]   ? Blood Component Type PLTP1 PSORALEN TREATED   ? Unit division 00   ? Status of Unit ISSUED   ? Transfusion Status    ?  OK TO TRANSFUSE ?Performed at [facility], [place] [address] [place], [place] [address] ?  ?  ?I have Reviewed nursing notes, Vitals, and Lab results since pt's last encounter. Pertinent lab results : see above ?I have ordered test including BMP, CBC, Mg ?I have reviewed the last note from staff over past 24 hours ?I have discussed pt's care plan and test results with nursing staff, case manager ? ? LOS: 6 days  ? ?Dwyane Dee, MD ?[facility] ?05/23/2021, 3:36 PM ?

## 2021-05-23 NOTE — Progress Notes (Signed)
Occupational Therapy Treatment ?Patient Details ?Name: Adam [name] ?MRN: [id number] ?DOB: 03/15/1955 ?Today's Date: 05/23/2021 ? ? ?History of present illness Adam [name] is a 66 y.o. male with medical history significant of metastatic small cell lung cancer, HTN. Presenting with multiple falls over the last week. Today presented to his [facility] clinic for regular follow up and with concerns of left knee and left forearm after falls at home. ?  ?OT comments ? Patient progressing and showed improved tolerance to standing grooming activities and ability to stand for 2 grooming tasks prior to going to the bathroom all with supervision to Min guard assist, compared to previous session where pt required higher level of assistance. Patient remains limited by cognitive deficits which pt expressed frustrate him, generalized weakness and decreased activity tolerance along with deficits noted below. Pt continues to demonstrate good rehab potential and would benefit from continued skilled OT to increase safety and independence with ADLs and functional transfers to allow pt to return home safely and reduce caregiver burden and fall risk. ?  ? ?Recommendations for follow up therapy are one component of a multi-disciplinary discharge planning process, led by the attending physician.  Recommendations may be updated based on patient status, additional functional criteria and insurance authorization. ?   ?Follow Up Recommendations ? Home health OT  ?  ?Assistance Recommended at Discharge Frequent or constant Supervision/Assistance (Due to cognitive deficits)  ?Patient can return home with the following ? A little help with walking and/or transfers;A little help with bathing/dressing/bathroom;Assistance with cooking/housework;Direct supervision/assist for financial management;Direct supervision/assist for medications management;Assist for transportation;Help with stairs or ramp for entrance ?  ?Equipment Recommendations ? BSC/3in1  ?   ?Recommendations for Other Services   ? ?  ?Precautions / Restrictions Precautions ?Precautions: Fall ?Precaution Comments: multiple falls, LUE and LLE wound, Mon HR ?Restrictions ?Weight Bearing Restrictions: No  ? ? ?  ? ?Mobility Bed Mobility ?Overal bed mobility:  (Pt in recliner) ?  ?  ?  ?  ?  ?  ?  ?  ? ?Transfers ?  ?  ?  ?  ?  ?  ?  ?  ?  ?  ?  ?  ?Balance Overall balance assessment: Mild deficits observed, not formally tested ?  ?  ?  ?  ?  ?  ?  ?  ?  ?  ?  ?  ?  ?  ?  ?  ?  ?  ?   ? ?ADL either performed or assessed with clinical judgement  ? ?ADL Overall ADL's : Needs assistance/impaired ?  ?  ?Grooming: Standing;Wash/dry face;Oral care;Supervision/safety;Wash/dry hands ?Grooming Details (indicate cue type and reason): Pt stood for face and oral care. Sat for hand hygiene post toileting with setup assist due to fatigue. ?  ?  ?  ?  ?  ?  ?Lower Body Dressing: Min guard ?Lower Body Dressing Details (indicate cue type and reason): Pt discovered his slippers in bathroom. Pt stood with his hurry-cane and stepped into each slipper with Min guard for balance safety. ?Toilet Transfer: Min Biomedical engineer;Ambulation ?Toilet Transfer Details (indicate cue type and reason): Pt used his hurry-cane to ambulate from recliner to sink and sink to bathroom with supervision. Pt descended to toilet with Min guard and use of grab bar. Stood from toilet with ?Toileting- Clothing Manipulation and Hygiene: Sitting/lateral lean;Sit to/from stand;Minimal assistance ?Toileting - Clothing Manipulation Details (indicate cue type and reason): Min As for hygiene to ensure thoroughness. ?  ?  ?  ?  ?  ? ?  Extremity/Trunk Assessment Upper Extremity Assessment ?Upper Extremity Assessment: Overall WFL for tasks assessed ?  ?  ?  ?  ?  ? ?Vision   ?  ?  ?Perception   ?  ?Praxis   ?  ? ?Cognition   ?  ?Overall Cognitive Status: No family/caregiver present to determine baseline cognitive functioning ?Area of Impairment:  Orientation, Awareness, Problem solving, Following commands ?  ?  ?  ?  ?  ?  ?  ?  ?Orientation Level: Time (knew hospital and [place], but not specific hospital name) ?  ?  ?Following Commands: Follows one step commands with increased time ?  ?Awareness: Emergent ?Problem Solving: Slow processing, Difficulty sequencing ?  ?  ?  ?   ?Exercises   ? ?  ?Shoulder Instructions   ? ? ?  ?General Comments    ? ? ?Pertinent Vitals/ Pain       Pain Assessment ?Pain Assessment: No/denies pain ? ?Home Living   ?  ?  ?  ?  ?  ?  ?  ?  ?  ?  ?  ?  ?  ?  ?  ?  ?  ?  ? ?  ?Prior Functioning/Environment    ?  ?  ?  ?   ? ?Frequency ? Min 2X/week  ? ? ? ? ?  ?Progress Toward Goals ? ?OT Goals(current goals can now be found in the care plan section) ? Progress towards OT goals: Progressing toward goals ? ?Acute Rehab OT Goals ?Patient Stated Goal: Less falls ?OT Goal Formulation: With patient ?Time For Goal Achievement: 06/01/21 ?Potential to Achieve Goals: Good  ?Plan Discharge plan remains appropriate   ? ?Co-evaluation ? ? ?   ?  ?  ?  ?  ? ?  ?AM-PAC OT "6 Clicks" Daily Activity     ?Outcome Measure ? ? Help from another person eating meals?: None ?Help from another person taking care of personal grooming?: A Little ?Help from another person toileting, which includes using toliet, bedpan, or urinal?: A Little ?Help from another person bathing (including washing, rinsing, drying)?: A Little ?Help from another person to put on and taking off regular upper body clothing?: A Little ?Help from another person to put on and taking off regular lower body clothing?: A Little ?6 Click Score: 19 ? ?  ?End of Session Equipment Utilized During Treatment: Other (comment);Gait belt (Pt's cane) ? ?OT Visit Diagnosis: Unsteadiness on feet (R26.81) ?  ?Activity Tolerance Patient tolerated treatment well ?  ?Patient Left in chair;with call bell/phone within reach;with chair alarm set;with nursing/sitter in room ?  ?Nurse Communication  (RN in  room as OT assisting pt back to recliner.) ?  ? ?   ? ?Time: 6301-6010 ?OT Time Calculation (min): 40 min ? ?Charges: OT General Charges ?$OT Visit: 1 Visit ?OT Treatments ?$Self Care/Home Management : 23-37 mins ?$Therapeutic Activity: 8-22 mins ? ?[name] Malta, OT ?Acute Rehab Services ?Office: [phone] ?05/23/2021 ? ?Julien Girt ?05/23/2021, 2:20 PM ?

## 2021-05-23 NOTE — Progress Notes (Signed)
Physical Therapy Treatment ?Patient Details ?Name: Adam [name] ?MRN: [id number] ?DOB: 1955-07-05 ?Today's Date: 05/23/2021 ? ? ?History of Present Illness Adam [name] is a 66 y.o. male with medical history significant of metastatic small cell lung cancer, HTN. Presenting with multiple falls over the last week. Today presented to his [facility] clinic for regular follow up and with concerns of left knee and left forearm after falls at home. ? ?  ?PT Comments  ? ? Progressing with mobility. He walked ~100 feet with a RW on today. HR ~120 with ambulation. Pt requires some encouragement. He immediately returned to bed after PT session. Recommend increased mobility with nursing in addition to PT sessions.  ?   ?Recommendations for follow up therapy are one component of a multi-disciplinary discharge planning process, led by the attending physician.  Recommendations may be updated based on patient status, additional functional criteria and insurance authorization. ? ?Follow Up Recommendations ? Home health PT ?  ?  ?Assistance Recommended at Discharge Frequent or constant Supervision/Assistance  ?Patient can return home with the following A little help with walking and/or transfers;A little help with bathing/dressing/bathroom;Assist for transportation;Help with stairs or ramp for entrance;Assistance with cooking/housework ?  ?Equipment Recommendations ? Rolling walker (2 wheels) (pt stated he has a rollator at home)  ?  ?Recommendations for Other Services   ? ? ?  ?Precautions / Restrictions Precautions ?Precautions: Fall ?Precaution Comments: multiple falls, LUE and LLE wound, monitor HR ?Restrictions ?Weight Bearing Restrictions: No  ?  ? ?Mobility ? Bed Mobility ?  ?  ?  ?  ?  ?Sit to supine: Min assist ?  ?General bed mobility comments: oob in recliner at start of session. assist for LEs back onto bed ?  ? ?Transfers ?Overall transfer level: Needs assistance ?Equipment used: Rolling walker (2 wheels) ?Transfers: Sit to/from  Stand ?Sit to Stand: Min guard ?  ?  ?  ?  ?  ?General transfer comment: Cues for safety, technique, hand placement. Increased time. ?  ? ?Ambulation/Gait ?Ambulation/Gait assistance: Min guard ?Gait Distance (Feet): 100 Feet ?Assistive device: Rolling walker (2 wheels) ?Gait Pattern/deviations: Step-through pattern, Decreased stride length, Decreased step length - right, Decreased step length - left, Trunk flexed ?  ?  ?  ?General Gait Details: Min guard for safety. Slow gait speed. 1 brief standing rest break after ~ 75 feet. Pt denied dizziness. ? ? ?Stairs ?  ?  ?  ?  ?  ? ? ?Wheelchair Mobility ?  ? ?Modified Rankin (Stroke Patients Only) ?  ? ? ?  ?Balance Overall balance assessment: Needs assistance ?  ?  ?  ?  ?Standing balance support: Reliant on assistive device for balance, During functional activity ?Standing balance-Leahy Scale: Poor ?  ?  ?  ?  ?  ?  ?  ?  ?  ?  ?  ?  ?  ? ?  ?Cognition Arousal/Alertness: Awake/alert ?Behavior During Therapy: Flat affect ?Overall Cognitive Status: No family/caregiver present to determine baseline cognitive functioning ?  ?  ?  ?  ?  ?  ?  ?  ?  ?  ?  ?  ?Following Commands: Follows one step commands with increased time ?  ?  ?Problem Solving: Slow processing ?General Comments: patient was able to follow commands with increased time with increased cues. ?  ?  ? ?  ?Exercises   ? ?  ?General Comments   ?  ?  ? ?Pertinent Vitals/Pain Pain Assessment ?  Pain Assessment: No/denies pain  ? ? ?Home Living   ?  ?  ?  ?  ?  ?  ?  ?  ?  ?   ?  ?Prior Function    ?  ?  ?   ? ?PT Goals (current goals can now be found in the care plan section) Progress towards PT goals: Progressing toward goals ? ?  ?Frequency ? ? ? Min 3X/week ? ? ? ?  ?PT Plan Current plan remains appropriate  ? ? ?Co-evaluation   ?  ?  ?  ?  ? ?  ?AM-PAC PT "6 Clicks" Mobility   ?Outcome Measure ? Help needed turning from your back to your side while in a flat bed without using bedrails?: A Little ?Help needed  moving from lying on your back to sitting on the side of a flat bed without using bedrails?: A Little ?Help needed moving to and from a bed to a chair (including a wheelchair)?: A Little ?Help needed standing up from a chair using your arms (e.g., wheelchair or bedside chair)?: A Little ?Help needed to walk in hospital room?: A Little ?Help needed climbing 3-5 steps with a railing? : A Little ?6 Click Score: 18 ? ?  ?End of Session Equipment Utilized During Treatment: Gait belt ?Activity Tolerance: Patient tolerated treatment well ?Patient left: in bed;with call bell/phone within reach;with bed alarm set ?  ?PT Visit Diagnosis: Muscle weakness (generalized) (M62.81);Unsteadiness on feet (R26.81);Repeated falls (R29.6) ?  ? ? ?Time: 9563-8756 ?PT Time Calculation (min) (ACUTE ONLY): 25 min ? ?Charges:  $Gait Training: 23-37 mins          ?          ? ? ? ? ?Doreatha Massed, PT ?Acute Rehabilitation  ?Office: [phone] ?Pager: [phone] ? ?  ? ?

## 2021-05-23 NOTE — TOC Progression Note (Signed)
Transition of Care (TOC) - Progression Note  ? ? ?Patient Details  ?Name: Adam [name] ?MRN: [id number] ?Date of Birth: [birth year] ? ?Transition of Care (TOC) CM/SW Contact  ?Purcell Mouton, RN ?Phone Number: ?05/23/2021, 3:03 PM ? ?Clinical Narrative:    ?TOC was informed that pt lives alone.  ? ? ?Expected Discharge Plan: [place] ?Barriers to Discharge: No Barriers Identified ? ?Expected Discharge Plan and Services ?Expected Discharge Plan: [place] ?  ?  ?  ?Living arrangements for the past 2 months: [place] ?                ?  ?  ?  ?  ?  ?  ?  ?  ?  ?  ? ? ?Social Determinants of Health (SDOH) Interventions ?  ? ?Readmission Risk Interventions ?   ? View : No data to display.  ?  ?  ?  ? ? ?

## 2021-05-24 ENCOUNTER — Inpatient Hospital Stay: Payer: Commercial Managed Care - HMO

## 2021-05-24 DIAGNOSIS — L02414 Cutaneous abscess of left upper limb: Secondary | ICD-10-CM

## 2021-05-24 DIAGNOSIS — L03114 Cellulitis of left upper limb: Secondary | ICD-10-CM | POA: Diagnosis not present

## 2021-05-24 DIAGNOSIS — N179 Acute kidney failure, unspecified: Secondary | ICD-10-CM | POA: Diagnosis not present

## 2021-05-24 DIAGNOSIS — L02416 Cutaneous abscess of left lower limb: Secondary | ICD-10-CM | POA: Diagnosis not present

## 2021-05-24 LAB — PREPARE PLATELET PHERESIS: Unit division: 0

## 2021-05-24 LAB — BPAM PLATELET PHERESIS
Blood Product Expiration Date: 202305182359
ISSUE DATE / TIME: 202305161045
Unit Type and Rh: 6200

## 2021-05-24 LAB — CBC WITH DIFFERENTIAL/PLATELET
Abs Immature Granulocytes: 0 10*3/uL (ref 0.00–0.07)
Basophils Absolute: 0 10*3/uL (ref 0.0–0.1)
Basophils Relative: 0 %
Eosinophils Absolute: 0 10*3/uL (ref 0.0–0.5)
Eosinophils Relative: 0 %
HCT: 23.5 % — ABNORMAL LOW (ref 39.0–52.0)
Hemoglobin: 7.8 g/dL — ABNORMAL LOW (ref 13.0–17.0)
Immature Granulocytes: 0 %
Lymphocytes Relative: 33 %
Lymphs Abs: 0.3 10*3/uL — ABNORMAL LOW (ref 0.7–4.0)
MCH: 28.8 pg (ref 26.0–34.0)
MCHC: 33.2 g/dL (ref 30.0–36.0)
MCV: 86.7 fL (ref 80.0–100.0)
Monocytes Absolute: 0.1 10*3/uL (ref 0.1–1.0)
Monocytes Relative: 9 %
Neutro Abs: 0.6 10*3/uL — ABNORMAL LOW (ref 1.7–7.7)
Neutrophils Relative %: 58 %
Platelets: 11 10*3/uL — CL (ref 150–400)
RBC: 2.71 MIL/uL — ABNORMAL LOW (ref 4.22–5.81)
RDW: 15.6 % — ABNORMAL HIGH (ref 11.5–15.5)
WBC: 1 10*3/uL — CL (ref 4.0–10.5)
nRBC: 0 % (ref 0.0–0.2)

## 2021-05-24 LAB — BASIC METABOLIC PANEL
Anion gap: 7 (ref 5–15)
BUN: 13 mg/dL (ref 8–23)
CO2: 25 mmol/L (ref 22–32)
Calcium: 8.3 mg/dL — ABNORMAL LOW (ref 8.9–10.3)
Chloride: 111 mmol/L (ref 98–111)
Creatinine, Ser: 0.64 mg/dL (ref 0.61–1.24)
GFR, Estimated: >60 mL/min (ref >60)
Glucose, Bld: 112 mg/dL — ABNORMAL HIGH (ref 70–99)
Potassium: 3.7 mmol/L (ref 3.5–5.1)
Sodium: 143 mmol/L (ref 135–145)

## 2021-05-24 LAB — MAGNESIUM: Magnesium: 1.8 mg/dL (ref 1.7–2.4)

## 2021-05-24 LAB — CULTURE, BLOOD (ROUTINE X 2)
Culture: NO GROWTH
Culture: NO GROWTH
Special Requests: ADEQUATE
Special Requests: ADEQUATE

## 2021-05-24 MED ORDER — MAGNESIUM SULFATE 2 GM/50ML IV SOLN: 2.0000 g | Freq: Once | INTRAVENOUS | Status: DC

## 2021-05-24 MED ORDER — ENSURE ENLIVE PO LIQD
237.0000 mL | Freq: Three times a day (TID) | ORAL | Status: DC
  Administered 2021-05-24 – 2021-06-02 (×25): 237 mL via ORAL

## 2021-05-24 MED ORDER — MAGNESIUM SULFATE 2 GM/50ML IV SOLN
2.0000 g | Freq: Once | INTRAVENOUS | Status: AC
  Administered 2021-05-24: 2 g via INTRAVENOUS
  Filled 2021-05-24: qty 50

## 2021-05-24 NOTE — Progress Notes (Signed)
?PROGRESS NOTE ? ? ? ?Adam [name]  YNW:[id number] DOB: May 01, 1955 DOA: 05/17/2021 ?PCP: de [place], [name], MD  ? ?Brief Narrative: ?66 year old with past medical history significant for metastatic small cell lung cancer, hypertension presented with multiple falls over the last week.  He presented to the cancer center clinic for follow-up and was having swelling in his left knee and left forearm near his elbow.  He was referred to the ER for further evaluation.  He underwent bedside debridement in the ER of his forearm and knee and swelling with packing placed afterwards.  He was started on IV antibiotics.  He was found to be bacteremic with blood cultures growing E. coli.  Orthopedic surgery was consulted on admission for further evaluation of his underlying abscess.  Orthopedic consulted and recommended continued daily wound care and IV antibiotics.  ID has been consulted for recommendation of IV antibiotics,  ?-I have Asked  orthopedic to follow-up on patient for further evaluation of arm infection. ? ? ?Assessment & Plan: ?  ?Active Problems: ?  Cellulitis of left forearm ?  Cellulitis of left knee ?  E coli bacteremia ?  Severe neutropenia (HCC) ?  Primary cancer of right upper lobe of lung ([place]) ?  Thrombocytopenia ([place]) ?  HTN (hypertension) ? ?1-Sepsis secondary to left forearm cellulitis and lower extremity cellulitis and abscess: ?Sepsis  physiology resolved. ?Patient presented with tachycardia, tachypnea, neutropenia, sepsis, infectious process.  Source of infection skin abscess and bacteremia ? ?2-Severe neutropenia: Secondary to  recent chemotherapy ?Continue with neutropenic precaution. ?Continue with Granix ?White Blood cell increased to 1 ?Oncology following ? ?3-E. coli bacteremia: ?Blood cultures positive on admission with E. Coli ?Repeated blood cultures 5/12 negative ?Was transition from Rocephin to Ancef. ? ?4-Cellulitis and Abscess skin near  left knee and left forearm:  ?He had I and D   performed in the ED at bedside.  Packing placed as well. ?Orthopedic consulted and recommending continue with wound care and IV antibiotics. ?ID consulted for recommendation for antibiotics. ?Orthopedic has been consulted again for further evaluation of the abscess due to concern for some indurated area. ? ?5-thrombocytopenia: Due to recent chemotherapy and underlying malignancy ?Received 3 packet of platelets infusion. ?Continue to monitor platelets ? ?AKI: ?Creatinine baseline 0.8.  Present with a creatinine of 1.5 ?Receive IV fluids.  Renal function improved.  Resolved ? ?Hypomagnesemia: Replete IV ? ? ? ?Estimated body mass index is 23.93 kg/m? as calculated from the following: ?  Height as of this encounter: 5\' 9"  (1.753 m). ?  Weight as of this encounter: 73.5 kg. ? ? ?DVT prophylaxis: SCD ?Code Status: Full code ?Family Communication: care discussed with patient.  ?Disposition Plan:  ?Status is: Inpatient ?Remains inpatient appropriate because: needs IV antibiotics, needs further evaluation by ortho for abscess. Neutropenia.  ? ? ? ?Consultants:  ?ID ?ortho ? ?Procedures:  ? ? ?Antimicrobials:  ?Ancef ? ?Subjective: ?He is alert, report p[ain arm. Denies dyspnea. Denies bleeding.  ? ?Objective: ?Vitals:  ? 05/23/21 1420 05/23/21 2120 05/24/21 0542 05/24/21 1320  ?BP: 115/76 114/75 126/78 128/82  ?Pulse: (!) 109 (!) 105 99 99  ?Resp: 18 16 15 18   ?Temp: 99.3 ?F (37.4 ?C) 99 ?F (37.2 ?C) 98.7 ?F (37.1 ?C) 99.1 ?F (37.3 ?C)  ?TempSrc: Oral Oral Oral Oral  ?SpO2: 100% 98% 95% 100%  ?Weight:      ?Height:      ? ? ?Intake/Output Summary (Last 24 hours) at 05/24/2021 1750 ?Last data filed  at 05/24/2021 1500 ?Gross per 24 hour  ?Intake 700 ml  ?Output 1450 ml  ?Net -750 ml  ? ?Filed Weights  ? 05/20/21 1101 05/21/21 0608  ?Weight: 75.6 kg 73.5 kg  ? ? ?Examination: ? ?General exam: Appears calm and comfortable  ?Respiratory system: Clear to auscultation. Respiratory effort normal. ?Cardiovascular system: S1 & S2  heard, RRR.  ?Gastrointestinal system: Abdomen is nondistended, soft and nontender.  ?Central nervous system: Alert and oriented. deficits. ?Extremities: Symmetric 5 x 5 power. ? ? ?Data Reviewed: I have personally reviewed following labs and imaging studies ? ?CBC: ?Recent Labs  ?Lab 05/20/21 ?0441 05/21/21 ?0355 05/22/21 ?0444 05/23/21 ?9741 05/24/21 ?0740  ?WBC 0.3* 0.3* 0.4* 0.6* 1.0*  ?NEUTROABS 0.0* 0.1* 0.2* 0.3* 0.6*  ?HGB 7.2* 7.4* 7.3* 7.7* 7.8*  ?HCT 21.5* 22.7* 22.8* 23.5* 23.5*  ?MCV 85.3 86.0 86.4 86.7 86.7  ?PLT 32* 18* 22* 11* 11*  ? ?Basic Metabolic Panel: ?Recent Labs  ?Lab 05/20/21 ?0441 05/21/21 ?6384 05/22/21 ?0444 05/23/21 ?5364 05/24/21 ?0740  ?NA 139 143 140 144 143  ?K 3.1* 3.0* 3.2* 3.5 3.7  ?CL 108 112* 109 113* 111  ?CO2 25 25 25 26 25   ?GLUCOSE 98 128* 117* 102* 112*  ?BUN 15 16 16 16 13   ?CREATININE 0.79 0.73 0.60* 0.64 0.64  ?CALCIUM 7.9* 8.2* 8.2* 8.2* 8.3*  ?MG 2.0 1.8 1.7 1.6* 1.8  ? ?GFR: ?Estimated Creatinine Clearance: 93.3 mL/min (by C-G formula based on SCr of 0.64 mg/dL). ?Liver Function Tests: ?Recent Labs  ?Lab 05/18/21 ?0501  ?AST 23  ?ALT 26  ?ALKPHOS 44  ?BILITOT 1.0  ?PROT 6.3*  ?ALBUMIN 2.2*  ? ?No results for input(s): LIPASE, AMYLASE in the last 168 hours. ?No results for input(s): AMMONIA in the last 168 hours. ?Coagulation Profile: ?Recent Labs  ?Lab 05/18/21 ?0501  ?INR 1.2  ? ?Cardiac Enzymes: ?No results for input(s): CKTOTAL, CKMB, CKMBINDEX, TROPONINI in the last 168 hours. ?BNP (last 3 results) ?No results for input(s): PROBNP in the last 8760 hours. ?HbA1C: ?No results for input(s): HGBA1C in the last 72 hours. ?CBG: ?No results for input(s): GLUCAP in the last 168 hours. ?Lipid Profile: ?No results for input(s): CHOL, HDL, LDLCALC, TRIG, CHOLHDL, LDLDIRECT in the last 72 hours. ?Thyroid Function Tests: ?No results for input(s): TSH, T4TOTAL, FREET4, T3FREE, THYROIDAB in the last 72 hours. ?Anemia Panel: ?No results for input(s): VITAMINB12, FOLATE, FERRITIN,  TIBC, IRON, RETICCTPCT in the last 72 hours. ?Sepsis Labs: ?Recent Labs  ?Lab 05/17/21 ?1859 05/18/21 ?0501  ?PROCALCITON  --  3.27  ?LATICACIDVEN 2.7*  --   ? ? ?Recent Results (from the past 240 hour(s))  ?Culture, blood (Routine x 2)     Status: Abnormal  ? Collection Time: 05/17/21 11:42 AM  ? Specimen: BLOOD  ?Result Value Ref Range Status  ? Specimen Description   Final  ?  BLOOD PORTA CATH ?Performed at [facility], [place] [address], [place], [place] [address] ?  ? Special Requests   Final  ?  BOTTLES DRAWN AEROBIC AND ANAEROBIC Blood Culture results may not be optimal due to an excessive volume of blood received in culture bottles ?Performed at [facility], [place] [address] [place], [place] [address] ?  ? Culture  Setup Time   Final  ?  GRAM NEGATIVE RODS ?IN BOTH AEROBIC AND ANAEROBIC BOTTLES ?CRITICAL RESULT CALLED TO, READ BACK BY AND VERIFIED WITH: M [email or web address]  05/18/21 [place] ?Performed at [facility], [place]  [address] [place], [place] [address] ?  ? Culture ESCHERICHIA COLI (A)  Final  ? Report Status 05/20/2021 FINAL  Final  ? Organism ID, Bacteria ESCHERICHIA COLI  Final  ?    Susceptibility  ? Escherichia coli - MIC*  ?  AMPICILLIN <=2 SENSITIVE Sensitive   ?  CEFAZOLIN <=4 SENSITIVE Sensitive   ?  CEFEPIME <=0.12 SENSITIVE Sensitive   ?  CEFTAZIDIME <=1 SENSITIVE Sensitive   ?  CEFTRIAXONE <=0.25 SENSITIVE Sensitive   ?  CIPROFLOXACIN <=0.25 SENSITIVE Sensitive   ?  GENTAMICIN <=1 SENSITIVE Sensitive   ?  IMIPENEM <=0.25 SENSITIVE Sensitive   ?  TRIMETH/SULFA <=20 SENSITIVE Sensitive   ?  AMPICILLIN/SULBACTAM <=2 SENSITIVE Sensitive   ?  PIP/TAZO <=4 SENSITIVE Sensitive   ?  * ESCHERICHIA COLI  ?Blood Culture ID Panel (Reflexed)     Status: Abnormal  ? Collection Time: 05/17/21 11:42 AM  ?Result Value Ref Range Status  ? Enterococcus faecalis NOT DETECTED NOT DETECTED Final  ? Enterococcus Faecium NOT DETECTED NOT DETECTED Final  ? Listeria  monocytogenes NOT DETECTED NOT DETECTED Final  ? Staphylococcus species NOT DETECTED NOT DETECTED Final  ? Staphylococcus aureus (BCID) NOT DETECTED NOT DETECTED Final  ? Staphylococcus epidermidis NOT DETECTED NOT DETEC

## 2021-05-24 NOTE — Consult Note (Signed)
?  [facility]  ? ? ?Date of Admission:  05/17/2021    ? ?Reason for Consult:Bacteremia, abscess ?    ?Referring Physician: Dr Tyrell Antonio ? ?Current antibiotics: ?Cefazolin 5/14 - present ? ?Previous antibiotics: ?Vancomycin 5/10-5/13 ?Ceftriaxone 5/10-5/13 ? ?ASSESSMENT:   ? ?66 y.o. male admitted with: ? ?Left knee and left elbow abscess/cellulitis: status post bedside I&D in the ED on 5/10.  Seen by orthopedics earlier this admission who did not feel that further interventions were needed at the time.  No cultures obtained at time of I&D but purulent material drained per EDP so suspect abscess +/- infected hematoma given recent falls and thrombocytopenia. ?E coli bacteremia: his admission blood culture from [facility] grew E coli but all other peripheral cultures are negative making it less likely he had true bacteremia.  Suspect his PAC is colonized with E coli.  Platelets might make it prohibitive to have PAC exchanged but would be something to consider in the future as colonization of his line places at greater risk for future CRBSI. ?Pancytopenia: related to chemotherapy.  ANC today is 600. ?Acute kidney injury: presented with creatinine 1.53 and this has resolved.  Antibiotics being dosed appropriately.  ?Lung cancer: currently on palliative systemic chemotherapy. ? ?RECOMMENDATIONS:   ? ?Continue cefazolin ?Wound care ?Lab monitoring ?Would ask ortho to reassess medial part of left forearm/elbow as this appears fairly tender and indurated.  ? Whether it needs to be opened up more for drainage or if further imaging would be indicated first ?Discussed with primary ?Will follow ? ? ?Active Problems: ?  Primary cancer of right upper lobe of lung ([place]) ?  HTN (hypertension) ?  Cellulitis of left forearm ?  Cellulitis of left knee ?  E coli bacteremia ?  Thrombocytopenia ([place]) ?  Severe neutropenia ([facility]) ? ? ?MEDICATIONS:   ? ?Scheduled Meds: ? Chlorhexidine Gluconate Cloth  6 each Topical Daily   ? feeding supplement  237 mL Oral TID BM  ? Tbo-filgastrim (GRANIX) SQ  480 mcg Subcutaneous q1800  ? ?Continuous Infusions: ?  ceFAZolin (ANCEF) IV 2 g (05/24/21 1328)  ? ?PRN Meds:.acetaminophen **OR** [DISCONTINUED] acetaminophen, HYDROcodone-acetaminophen, sodium chloride flush ? ?HPI:   ? ?Adam [name] is a 66 y.o. male with PMHx as below presenting to the ED on 5/10 with multiple falls the week prior to admission.  He was seen at the [facility] clinic for follow up and noted to have left knee and elbow swelling concerning for abscess.  He underwent bedside I&D in the ED of his knee and elbow area.  Cultures were not obtained but ED notes indicate purulent drainage was expressed with I&D.  He had blood cultures drawn in the ED as well with 1 of 2 growing E coli, however, this positive culture was drawn from his Port-A-Cath and 2nd culture that never grew anything was not labeled.  He had repeat blood cultures drawn 5/12 that were negative.  He was on CTX/Vancomycin and this has been narrowed to Cefazolin.  He is with his wife today who reports the swelling and erythema of his leg and elbow looks much better.  He has no pain or redness at his PAC site.  He has been afebrile and notes no bleeding right now.  They have been doing dressing changes daily for his wounds.  ? ? ?Past Medical History:  ?Diagnosis Date  ? Hypertension   ? Lung cancer ([place])   ? small cell RUL  ? ? ?  Social History  ? ?Tobacco Use  ? Smoking status: Former  ?  Packs/day: 1.00  ?  Years: 20.00  ?  Pack years: 20.00  ?  Types: Cigarettes  ?  Quit date: 06/09/2018  ?  Years since quitting: 2.9  ? Smokeless tobacco: Never  ?Vaping Use  ? Vaping Use: Never used  ?Substance Use Topics  ? Alcohol use: Not Currently  ? Drug use: Never  ? ? ?Family History  ?Problem Relation Age of Onset  ? Heart disease Mother   ? Cancer Cousin   ? Breast cancer Neg Hx   ? Colon cancer Neg Hx   ? Pancreatic cancer Neg Hx   ? Prostate cancer Neg Hx   ? ? ?No Known  Allergies ? ?Review of Systems  ?All other systems reviewed and are negative. Except as noted above in HPI.  ? ?OBJECTIVE:  ? ?Blood pressure 128/82, pulse 99, temperature 99.1 ?F (37.3 ?C), temperature source Oral, resp. rate 18, height 5\' 9"  (1.753 m), weight 73.5 kg, SpO2 100 %. ?Body mass index is 23.93 kg/m?. ? ?Physical Exam ?Constitutional:   ?   General: He is not in acute distress. ?   Appearance: Normal appearance.  ?HENT:  ?   Head: Normocephalic and atraumatic.  ?   Mouth/Throat:  ?   Mouth: Mucous membranes are moist.  ?   Pharynx: Oropharynx is clear.  ?Eyes:  ?   Extraocular Movements: Extraocular movements intact.  ?   Conjunctiva/sclera: Conjunctivae normal.  ?Cardiovascular:  ?   Comments: PAC site currently accessed.  No erythema or TTP. ?Pulmonary:  ?   Effort: Pulmonary effort is normal. No respiratory distress.  ?Abdominal:  ?   General: There is no distension.  ?   Palpations: Abdomen is soft.  ?Musculoskeletal:  ?   Cervical back: Normal range of motion and neck supple.  ?   Right lower leg: No edema.  ?   Left lower leg: No edema.  ?   Comments: Left elbow with 2 bandages covering wounds.  Lateral wound with packing in place.  Medial wound is more tender, firm, and indurated.  ?Left knee wound also with packing.  ?Skin: ?   General: Skin is warm and dry.  ?Neurological:  ?   General: No focal deficit present.  ?   Mental Status: He is alert and oriented to person, place, and time.  ?Psychiatric:     ?   Mood and Affect: Mood normal.     ?   Behavior: Behavior normal.  ? ? ? ?Lab Results: ?Lab Results  ?Component Value Date  ? WBC 1.0 (LL) 05/24/2021  ? HGB 7.8 (L) 05/24/2021  ? HCT 23.5 (L) 05/24/2021  ? MCV 86.7 05/24/2021  ? PLT 11 (LL) 05/24/2021  ?  ?Lab Results  ?Component Value Date  ? NA 143 05/24/2021  ? K 3.7 05/24/2021  ? CO2 25 05/24/2021  ? GLUCOSE 112 (H) 05/24/2021  ? BUN 13 05/24/2021  ? CREATININE 0.64 05/24/2021  ? CALCIUM 8.3 (L) 05/24/2021  ? GFRNONAA >60 05/24/2021  ?   ?Lab Results  ?Component Value Date  ? ALT 26 05/18/2021  ? AST 23 05/18/2021  ? ALKPHOS 44 05/18/2021  ? BILITOT 1.0 05/18/2021  ? ? ?No results found for: CRP ? ?No results found for: ESRSEDRATE ? ?I have reviewed the micro and lab results in Epic. ? ?Imaging: ?No results found.  ? ?Imaging independently reviewed in Epic. ? ?Mignon Pine ?  [facility]ious [facility] ?[place] ?[phone] pager ?05/24/2021, 1:55 PM ? ? ?

## 2021-05-25 DIAGNOSIS — L02416 Cutaneous abscess of left lower limb: Secondary | ICD-10-CM | POA: Diagnosis not present

## 2021-05-25 DIAGNOSIS — D61818 Other pancytopenia: Secondary | ICD-10-CM | POA: Diagnosis not present

## 2021-05-25 DIAGNOSIS — C3411 Malignant neoplasm of upper lobe, right bronchus or lung: Secondary | ICD-10-CM | POA: Diagnosis not present

## 2021-05-25 DIAGNOSIS — D696 Thrombocytopenia, unspecified: Secondary | ICD-10-CM | POA: Diagnosis not present

## 2021-05-25 LAB — CBC WITH DIFFERENTIAL/PLATELET
Abs Immature Granulocytes: 0.01 10*3/uL (ref 0.00–0.07)
Basophils Absolute: 0 10*3/uL (ref 0.0–0.1)
Basophils Relative: 1 %
Eosinophils Absolute: 0 10*3/uL (ref 0.0–0.5)
Eosinophils Relative: 0 %
HCT: 23.1 % — ABNORMAL LOW (ref 39.0–52.0)
Hemoglobin: 7.5 g/dL — ABNORMAL LOW (ref 13.0–17.0)
Immature Granulocytes: 1 %
Lymphocytes Relative: 31 %
Lymphs Abs: 0.4 10*3/uL — ABNORMAL LOW (ref 0.7–4.0)
MCH: 28.3 pg (ref 26.0–34.0)
MCHC: 32.5 g/dL (ref 30.0–36.0)
MCV: 87.2 fL (ref 80.0–100.0)
Monocytes Absolute: 0.1 10*3/uL (ref 0.1–1.0)
Monocytes Relative: 9 %
Neutro Abs: 0.8 10*3/uL — ABNORMAL LOW (ref 1.7–7.7)
Neutrophils Relative %: 58 %
Platelets: 10 10*3/uL — CL (ref 150–400)
RBC: 2.65 MIL/uL — ABNORMAL LOW (ref 4.22–5.81)
RDW: 15.6 % — ABNORMAL HIGH (ref 11.5–15.5)
WBC: 1.3 10*3/uL — CL (ref 4.0–10.5)
nRBC: 0 % (ref 0.0–0.2)

## 2021-05-25 LAB — BASIC METABOLIC PANEL
Anion gap: 5 (ref 5–15)
BUN: 15 mg/dL (ref 8–23)
CO2: 26 mmol/L (ref 22–32)
Calcium: 8.3 mg/dL — ABNORMAL LOW (ref 8.9–10.3)
Chloride: 111 mmol/L (ref 98–111)
Creatinine, Ser: 0.75 mg/dL (ref 0.61–1.24)
GFR, Estimated: >60 mL/min (ref >60)
Glucose, Bld: 124 mg/dL — ABNORMAL HIGH (ref 70–99)
Potassium: 3.6 mmol/L (ref 3.5–5.1)
Sodium: 142 mmol/L (ref 135–145)

## 2021-05-25 LAB — PREPARE RBC (CROSSMATCH)

## 2021-05-25 LAB — MAGNESIUM: Magnesium: 1.8 mg/dL (ref 1.7–2.4)

## 2021-05-25 MED ORDER — SODIUM CHLORIDE 0.9% IV SOLUTION: Freq: Once | INTRAVENOUS | Status: DC

## 2021-05-25 MED ORDER — LIDOCAINE HCL (PF) 1 % IJ SOLN
30.0000 mL | Freq: Once | INTRAMUSCULAR | Status: AC
  Administered 2021-05-25: 30 mL
  Filled 2021-05-25: qty 30

## 2021-05-25 NOTE — Progress Notes (Signed)
Patient ID: TARYN NAVE, male   DOB: 1955-08-19, 66 y.o.   MRN: [id number]      RUAIRI STUTSMAN is a 65 y.o. male   Orthopaedic diagnosis: Left knee and elbow abscesses, status post bedside I&D in ED on 05/17/21  Subjective: Patient was reevaluated at the request of the hospitalist team for worsening induration and swelling about the proximal forearm.  The patient reports he feels comfortable in bed, and his pain has improved overall since admission.  He has been on IV antibiotics.  However, he does endorse significant tenderness to palpation over the proximal forearm in the region of the abscesses.  He did undergo bedside I&D in the ED the left knee and left elbow, and has had daily wound packing since.  Denies any pain with range of motion at the elbow, hand, or knee.  He reports his left knee is not very painful or bothersome.  Objectyive: Vitals:   05/24/21 2038 05/25/21 0531  BP: 118/77 126/81  Pulse: (!) 103 100  Resp: 20 18  Temp: 98.1 F (36.7 C) 99 F (37.2 C)  SpO2: 97% 96%     Exam: Awake and alert Respirations even and unlabored No acute distress  Left elbow shows two wounds, medial and lateral about the elbow and forearm, with packing in place.  There is surrounding erythema, eschar formation, and some necrotic tissue about the wounds.  He does appear to have new isolated areas of induration adjacent to prior I&D sites, suggestive of recurrent abscess.  There is swelling globally about the proximal forearm.  He has tenderness to palpation over the wounds.  Otherwise nontender to palpation throughout.  No significant discomfort with range of motion at the elbow, range of motion is near full.  Strength is equal.  No significant ongoing drainage.  +2 palpable radial pulse.   Examination left knee shows small wound lateral and distal to the knee joint, about the region of the fibular head.  A small eschar along with surrounding erythema.  There is some necrosis of the skin about  the wound.  Packing is in place.  It is tender to palpation, but no obvious evidence of recurrent abscess.  The wound was repacked with iodoform and a dressing was placed.  No obvious effusion or tenderness about the knee.  Range of motion at the knee is nonpainful and nearly full.  Calf is soft and nontender.  Sensation is grossly intact.  Palpable +2 pedal pulses.   Assessment: Left knee and elbow abscesses, status post bedside I&D in ED on 05/17/21, with recurrent superficial abscesses about the forearm clinically today.  Plan: Overall, patient's pain and swelling have improved since hospital admission and administration of IV antibiotics.  He does have evidence of recurrent superficial abscesses adjacent to prior I&D sites that are very tender to palpation.  Discussed the role of repeat bedside I&D of the left forearm to evacuate the abscess.  He would like to proceed with this.  This was discussed with nursing staff and they will gather materials/equipment needed and we will plan to do this at some point today.  Patient was in agreement with this plan.   Flemon Kelty J. [place], PA-C

## 2021-05-25 NOTE — Progress Notes (Signed)
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  He is receiving palliative systemic chemotherapy with Zepzelca 3.2 mg per metered squared every 3 weeks.  Status post 2 cycles.  Last cycle was given on 05/10/2021.  Now admitted with sepsis secondary to cellulitis/abscess.  He is receiving IV antibiotics for E. coli bacteremia and cellulitis.  Repeat blood cultures are negative to date.  He has been afebrile.  He continues to have pancytopenia due to recent chemotherapy and sepsis.  We will plan to continue supportive care with Granix 480 mcg subcu daily until ANC is 1.5 or higher.  His white blood cell count is slowly improving.  Hemoglobin 7.5 this morning.  We will give 1 unit PRBCs.  Recommend PRBC transfusion for hemoglobin less than 8.  Platelet count is 10,000 today.  Recommend platelet transfusion.  Transfuse platelets for platelet count less than 20,000 or active bleeding.  Mikey Bussing, DNP, AGPCNP-BC, AOCNP  The total time spent in the visit was 40 minutes. Greater than 50%  of this time was spent counseling and coordinating care related to the above assessment and plan.   SUBJECTIVE: Afebrile, no new complaints, no bleeding.  Oncology History Overview Note  He is currently on the following trial. Please refer to my original note for complete history.  LIMITED STAGE SMALL CELL LUNG CANCER ([facility]-S[facility]): A PHASE III   RANDOMIZED STUDY OF CHEMORADIATION VERSUS CHEMORADIATION PLUS ATEZOLIZUMAB (10-June-2019)  C1 D1 02/16/2020-02/18/2020 He has consented for the trial and is enrolled to the arm without immunotherapy. C2 D1-D3 03/15/2020-03/17/2020 C3D1 completed 04/05/2020 through 04/07/2020 C4D1 delayed by a week because of neutropenia. 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  HENT: Negative.    Eyes: Negative.  Respiratory: Negative.    Cardiovascular: Negative.   Gastrointestinal: Negative.   Skin:        Draining wounds to his left knee and left elbow  Neurological: Negative.   Endo/Heme/Allergies:  Does not bruise/bleed easily.  Psychiatric/Behavioral: Negative.     I have reviewed the past medical history, past surgical history, social history and family history with  the patient and they are unchanged from previous note.   PHYSICAL EXAMINATION: ECOG PERFORMANCE STATUS: 2 - Symptomatic, <50% confined to bed  Vitals:   05/24/21 2038 05/25/21 0531  BP: 118/77 126/81  Pulse: (!) 103 100  Resp: 20 18  Temp: 98.1 F (36.7 C) 99 F (37.2 C)  SpO2: 97% 96%   Filed Weights   05/20/21 1101 05/21/21 0608  Weight: 75.6 kg 73.5 kg    Intake/Output from previous day: 05/17 0701 - 05/18 0700 In: 1020 [P.O.:720; IV Piggyback:300] Out: 2050 [Urine:2050]  Physical Exam Vitals reviewed.  Constitutional:      General: He is not in acute distress. HENT:     Head: Normocephalic.  Eyes:     General: No scleral icterus. Cardiovascular:     Rate and Rhythm: Tachycardia present.  Pulmonary:     Effort: Pulmonary effort is normal. No respiratory distress.  Skin:    Comments: Left knee and left elbow wounds noted to have purulent drainage.  Neurological:     Mental Status: He is alert and oriented to person, place, and time.    LABORATORY DATA:  I have reviewed the data as listed    Latest Ref Rng & Units 05/25/2021    8:00 AM 05/24/2021    7:40 AM 05/23/2021    3:47 AM  CMP  Glucose 70 - 99 mg/dL 124   112   102    BUN 8 - 23 mg/dL 15   13   16     Creatinine 0.61 - 1.24 mg/dL 0.75   0.64   0.64    Sodium 135 - 145 mmol/L 142   143   144    Potassium 3.5 - 5.1 mmol/L 3.6   3.7   3.5    Chloride 98 - 111 mmol/L 111   111   113    CO2 22 - 32 mmol/L 26   25   26     Calcium 8.9 - 10.3 mg/dL 8.3   8.3   8.2      Lab Results  Component Value Date   WBC 1.3 (LL) 05/25/2021   HGB 7.5 (L) 05/25/2021   HCT 23.1 (L) 05/25/2021   MCV 87.2 05/25/2021   PLT 10 (LL) 05/25/2021   NEUTROABS 0.8 (L) 05/25/2021    No results found for: CEA1, CEA, CAN199, CA125, PSA1  DG Chest 2 View  Result Date: 05/17/2021 CLINICAL DATA:  Suspected sepsis.  History of lung cancer. EXAM: CHEST - 2 VIEW COMPARISON:  04/07/2021 CT FINDINGS: Right Port-A-Cath tip at  superior caval/atrial junction. Numerous leads and wires project over the chest. Tracheal deviation right. Right hemidiaphragm elevation. Normal heart size. No pleural fluid. 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 Radiation change within the right suprahilar region and paramediastinal lung. No superimposed lobar consolidation. IMPRESSION: No evidence of pneumonia. 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 Emphysema (ICD10-J43.9). Electronically Signed   By: Abigail Miyamoto M.D.   On: 05/17/2021 12:12   DG Forearm Left  Result Date: 05/17/2021 CLINICAL DATA:  Left forearm wound after fall several weeks ago. EXAM: LEFT FOREARM - 2 VIEW COMPARISON:  None Available. FINDINGS: There is no evidence of fracture or other focal bone lesions. Large amount of soft tissue gas is seen around proximal forearm. Some high density material is noted within the soft tissues which may represent either surgical packing or other foreign body. IMPRESSION: No fracture or dislocation is noted. Large amount of soft tissue gas seen in proximal forearm consistent with laceration or other wounds. High density material is seen in these areas which may represent surgical packing or other foreign body; clinical correlation is recommended Electronically Signed   By: Marijo Conception M.D.   On: 05/17/2021 20:41   DG Knee 1-2 Views Left  Result Date: 05/17/2021 CLINICAL DATA:  Left knee wound after fall. EXAM: LEFT KNEE - 1-2 VIEW COMPARISON:  None Available. FINDINGS: No evidence of fracture, dislocation, or joint effusion. No evidence of arthropathy. Mild patellar spurring is noted. Vascular calcifications are noted. Soft tissue gas is noted lateral to the joint space consistent with laceration. IMPRESSION: Soft tissue gas noted lateral to the joint space consistent with laceration. No fracture or dislocation is noted.  Electronically Signed   By: Marijo Conception M.D.   On: 05/17/2021 20:38   US RENAL  Result Date: 05/17/2021 CLINICAL DATA:  Acute renal insufficiency EXAM: RENAL / URINARY TRACT ULTRASOUND COMPLETE COMPARISON:  CT 04/07/2021 FINDINGS: Right Kidney: Renal measurements: 10.9 x 4.9 x 4.6 cm = volume: 129 mL. Echogenicity within normal limits. No mass or hydronephrosis visualized. Left Kidney: Renal measurements: 10.1 x 5.8 x 4.8 cm = volume: 146 mL. Echogenicity within normal limits. No mass or hydronephrosis visualized. 9 mm nonobstructing calculus noted within the interpolar region of the left kidney. Bladder: Appears normal for degree of bladder distention. Bilateral ureteral jets are identified Other: None. IMPRESSION: Stable left nonobstructing nephrolithiasis. Otherwise unremarkable examination. Electronically Signed   By: Fidela Salisbury M.D.   On: 05/17/2021 19:26     Future Appointments  Date Time Provider [place]  05/31/2021 11:00 AM [facility] [place] FLUSH [facility] None  05/31/2021 11:30 AM Heilingoetter, Cassandra L, PA-C [facility] None  05/31/2021 12:30 PM [facility] INFUSION [facility] None  06/07/2021  1:30 PM [facility] [place] FLUSH [facility] None  06/14/2021  2:00 PM [facility] [place] FLUSH [facility] None  06/21/2021  1:00 PM [facility] [place] FLUSH [facility] None  06/21/2021  1:30 PM Curt Bears, MD [facility] None  06/21/2021  2:30 PM [facility] INFUSION [facility] None  06/21/2021  3:15 PM Morrell Riddle, RD [facility] None  06/28/2021  2:15 PM [facility] [place] FLUSH [facility] None  07/12/2021 10:30 AM [facility] [place] FLUSH [facility] None  07/12/2021 11:00 AM Heilingoetter, Cassandra L, PA-C [facility] None  07/12/2021 12:15 PM [facility] INFUSION [facility] None      LOS: 8 days

## 2021-05-25 NOTE — Consult Note (Addendum)
Reason for Consult: Evaluation of proximal forearm wound Referring Physician: Jesse Martinique, PA-C  Adam [name] is an 66 y.o. male.  HPI: Patient is a 66 year old male with PMH significant for metastatic small cell lung cancer who was sent to the ED from the cancer clinic for evaluation of left knee and left forearm swelling.  Patient was admitted 05/17/2021 for sepsis, AKI, and neutropenia.  Blood cultures grew E. coli.  He underwent bedside I&D in the ER for his multiple abscesses and was started on IV antibiotics empirically.  Orthopedics was involved early and performed I&D of left forearm wound 05/25/2021 at bedside.  ID involved for ongoing antibiotic recommendations.  Hem-Onc managing pancytopenia with Granix.  Receiving PRBC and platelet transfusions.  Plan per orthopedics is for continued wound care with daily packing.  Plastic surgery consulted for evaluation of forearm wound.  On my examination, patient is resting comfortably in bed.  Denies any pain symptoms.  Tells me that he is slowly improving his energy levels, but remains fatigued.   Past Medical History:  Diagnosis Date   Hypertension    Lung cancer ([place])    small cell RUL    Past Surgical History:  Procedure Laterality Date   BRONCHIAL BRUSHINGS  02/08/2020   Procedure: BRONCHIAL BRUSHINGS;  Surgeon: Freddi Starr, MD;  Location: [facility] END[facility]COPY;  Service: Pulmonary;;   BRONCHIAL NEEDLE ASPIRATION BIOPSY  02/08/2020   Procedure: BRONCHIAL NEEDLE ASPIRATION BIOPSIES;  Surgeon: Freddi Starr, MD;  Location: [place] [facility];  Service: Pulmonary;;   IR IMAGING GUIDED PORT INSERTION  12/30/2020   KNEE SURGERY Right    SHOULDER SURGERY Left    VIDEO BRONCHOSCOPY WITH ENDOBRONCHIAL ULTRASOUND N/A 02/08/2020   Procedure: VIDEO BRONCHOSCOPY WITH ENDOBRONCHIAL ULTRASOUND;  Surgeon: Freddi Starr, MD;  Location: [facility];  Service: Pulmonary;  Laterality: N/A;    Family History  Problem Relation Age of Onset   Heart  disease Mother    Cancer Cousin    Breast cancer Neg Hx    Colon cancer Neg Hx    Pancreatic cancer Neg Hx    Prostate cancer Neg Hx     Social History:  reports that he quit smoking about 2 years ago. His smoking use included cigarettes. He has a 20.00 pack-year smoking history. He has never used smokeless tobacco. He reports that he does not currently use alcohol. He reports that he does not use drugs.  Allergies: No Known Allergies  Medications: I have reviewed the patient's current medications.  Results for orders placed or performed during the hospital encounter of 05/17/21 (from the past 48 hour(s))  CBC with Differential/Platelet     Status: Abnormal   Collection Time: 05/24/21  7:40 AM  Result Value Ref Range   WBC 1.0 (LL) 4.0 - 10.5 K/uL    Comment: CRITICAL VALUE NOTED.  VALUE IS CONSISTENT WITH PREVIOUSLY REPORTED AND CALLED VALUE. REPEATED TO VERIFY    RBC 2.71 (L) 4.22 - 5.81 MIL/uL   Hemoglobin 7.8 (L) 13.0 - 17.0 g/dL   HCT 23.5 (L) 39.0 - 52.0 %   MCV 86.7 80.0 - 100.0 fL   MCH 28.8 26.0 - 34.0 pg   MCHC 33.2 30.0 - 36.0 g/dL   RDW 15.6 (H) 11.5 - 15.5 %   Platelets 11 (LL) 150 - 400 K/uL    Comment: Immature Platelet Fraction may be clinically indicated, consider ordering this additional test [id number] CRITICAL VALUE NOTED.  VALUE IS CONSISTENT WITH PREVIOUSLY REPORTED AND CALLED VALUE.  REPEATED TO VERIFY    nRBC 0.0 0.0 - 0.2 %   Neutrophils Relative % 58 %   Neutro Abs 0.6 (L) 1.7 - 7.7 K/uL   Lymphocytes Relative 33 %   Lymphs Abs 0.3 (L) 0.7 - 4.0 K/uL   Monocytes Relative 9 %   Monocytes Absolute 0.1 0.1 - 1.0 K/uL   Eosinophils Relative 0 %   Eosinophils Absolute 0.0 0.0 - 0.5 K/uL   Basophils Relative 0 %   Basophils Absolute 0.0 0.0 - 0.1 K/uL   Immature Granulocytes 0 %   Abs Immature Granulocytes 0.00 0.00 - 0.07 K/uL   Ovalocytes PRESENT     Comment: Performed at [facility], [place] [address], [place], [place] [address]   Basic metabolic panel     Status: Abnormal   Collection Time: 05/24/21  7:40 AM  Result Value Ref Range   Sodium 143 135 - 145 mmol/L   Potassium 3.7 3.5 - 5.1 mmol/L   Chloride 111 98 - 111 mmol/L   CO2 25 22 - 32 mmol/L   Glucose, Bld 112 (H) 70 - 99 mg/dL    Comment: Glucose reference range applies only to samples taken after fasting for at least 8 hours.   BUN 13 8 - 23 mg/dL   Creatinine, Ser 0.64 0.61 - 1.24 mg/dL   Calcium 8.3 (L) 8.9 - 10.3 mg/dL   GFR, Estimated >60 >60 mL/min    Comment: (NOTE) Calculated using the CKD-EPI Creatinine Equation (2021)    Anion gap 7 5 - 15    Comment: Performed at [facility], [place] [address] [place], [place] [address]  Magnesium     Status: None   Collection Time: 05/24/21  7:40 AM  Result Value Ref Range   Magnesium 1.8 1.7 - 2.4 mg/dL    Comment: Performed at [facility], [place] [address], [place], [place] [address]  CBC with Differential/Platelet     Status: Abnormal   Collection Time: 05/25/21  8:00 AM  Result Value Ref Range   WBC 1.3 (LL) 4.0 - 10.5 K/uL    Comment: REPEATED TO VERIFY THIS CRITICAL RESULT HAS VERIFIED AND BEEN CALLED TO MAYS, D. RN BY JOLANDE MECIAL ON 05 18 2023 AT 0940, AND HAS BEEN READ BACK. REPEATED TO VERIFY    RBC 2.65 (L) 4.22 - 5.81 MIL/uL   Hemoglobin 7.5 (L) 13.0 - 17.0 g/dL   HCT 23.1 (L) 39.0 - 52.0 %   MCV 87.2 80.0 - 100.0 fL   MCH 28.3 26.0 - 34.0 pg   MCHC 32.5 30.0 - 36.0 g/dL   RDW 15.6 (H) 11.5 - 15.5 %   Platelets 10 (LL) 150 - 400 K/uL    Comment: Immature Platelet Fraction may be clinically indicated, consider ordering this additional test [id number] REPEATED TO VERIFY THIS CRITICAL RESULT HAS VERIFIED AND BEEN CALLED TO MAYS, D. RN BY JOLANDE MECIAL ON 05 18 2023 AT 0940, AND HAS BEEN READ BACK. REPEATED TO VERIFY    nRBC 0.0 0.0 - 0.2 %   Neutrophils Relative % 58 %   Neutro Abs 0.8 (L) 1.7 - 7.7 K/uL   Lymphocytes Relative 31 %   Lymphs Abs  0.4 (L) 0.7 - 4.0 K/uL   Monocytes Relative 9 %   Monocytes Absolute 0.1 0.1 - 1.0 K/uL   Eosinophils Relative 0 %   Eosinophils Absolute 0.0 0.0 - 0.5 K/uL   Basophils Relative 1 %   Basophils Absolute  0.0 0.0 - 0.1 K/uL   WBC Morphology DOHLE BODIES     Comment: TOXIC GRANULATION   Immature Granulocytes 1 %   Abs Immature Granulocytes 0.01 0.00 - 0.07 K/uL    Comment: Performed at [facility], [place] [address], [place], [place] [address]  Basic metabolic panel     Status: Abnormal   Collection Time: 05/25/21  8:00 AM  Result Value Ref Range   Sodium 142 135 - 145 mmol/L   Potassium 3.6 3.5 - 5.1 mmol/L   Chloride 111 98 - 111 mmol/L   CO2 26 22 - 32 mmol/L   Glucose, Bld 124 (H) 70 - 99 mg/dL    Comment: Glucose reference range applies only to samples taken after fasting for at least 8 hours.   BUN 15 8 - 23 mg/dL   Creatinine, Ser 0.75 0.61 - 1.24 mg/dL   Calcium 8.3 (L) 8.9 - 10.3 mg/dL   GFR, Estimated >60 >60 mL/min    Comment: (NOTE) Calculated using the CKD-EPI Creatinine Equation (2021)    Anion gap 5 5 - 15    Comment: Performed at [facility], [place] [address] [place], [place] [address]  Magnesium     Status: None   Collection Time: 05/25/21  8:00 AM  Result Value Ref Range   Magnesium 1.8 1.7 - 2.4 mg/dL    Comment: Performed at [facility], [place] [address], [place], [place] [address]  Prepare RBC (crossmatch)     Status: None   Collection Time: 05/25/21  2:35 PM  Result Value Ref Range   Order Confirmation      ORDER PROCESSED BY BLOOD BANK Performed at [facility], [place] [address], [place], [place] [address]   Prepare platelet pheresis     Status: None (Preliminary result)   Collection Time: 05/25/21  2:52 PM  Result Value Ref Range   Unit Number [id number]    Blood Component Type PLTP1 PSORALEN TREATED    Unit division 00    Status of Unit ALLOCATED    Transfusion Status OK TO  TRANSFUSE     No results found.  Review of Systems  Constitutional:  Positive for malaise/fatigue. Negative for fever.  Musculoskeletal:  Negative for joint pain and myalgias.    Blood pressure 131/80, pulse (!) 106, temperature 98.1 F (36.7 C), temperature source Oral, resp. rate (!) 22, height 5\' 9"  (1.753 m), weight 73.5 kg, SpO2 100 %. Physical Exam Constitutional:      Appearance: He is not toxic-appearing.     Comments: Fatigued  HENT:     Head: Atraumatic.  Pulmonary:     Effort: Pulmonary effort is normal.  Musculoskeletal:        General: No tenderness.     Comments: Left forearm: Approximately 4 x 4 centimeter wound over proximal ulnar/volar aspect.  Slough at base.  Approximately 3 x 1.5 cm area of full-thickness necrosis.  Surrounding induration.  Non tender.  No fluctuance noted.  No significant malodor or drainage.  Radial pulse intact.  Radial, median, and ulnar nerves all intact.   Skin:    Findings: Erythema present.     Comments: Indurated skin surrounding I&D site left forearm.  Neurological:     Mental Status: He is alert.  Psychiatric:        Behavior: Behavior normal.      Assessment/Plan:  Patient is receiving PRBC and platelet transfusions here in the hospital for his pancytopenia in  the context of metastatic small cell lung cancer.  He does have a small area of what appears to be full-thickness necrosis on periphery of left forearm wound.  If patient was a better surgical candidate, could consider short debridement.  However, given comorbidities, would continue with current nonoperative management involving daily dressing changes.  The necrotic area may slough off. Could consider enzymatic debridement with collagenase in the future, but for now continue with daily dressing changes of iodoform, Vaseline, and gauze per wound care.  Continue abx per primary team and ID.    No surgical intervention at this time.  Discussed case with Dr. Claudia Desanctis who is in  agreement.  Picture(s) obtained of the patient and placed in the chart were with the patient's or guardian's permission.  Recommend referral to wound care center for continued outpatient wound care upon discharge.   Krista Blue 05/25/2021, 3:18 PM

## 2021-05-25 NOTE — Progress Notes (Addendum)
PROGRESS NOTE    Adam [name]  TKW:[id number] DOB: 1955/04/25 DOA: 05/17/2021 PCP: de [place], [name], MD   Brief Narrative: 66 year old with past medical history significant for metastatic small cell lung cancer, hypertension presented with multiple falls over the last week.  He presented to the cancer center clinic for follow-up and was having swelling in his left knee and left forearm near his elbow.  He was referred to the ER for further evaluation.  He underwent bedside debridement in the ER of his forearm and knee and swelling with packing placed afterwards.  He was started on IV antibiotics.  He was found to be bacteremic with blood cultures growing E. coli.  Orthopedic surgery was consulted on admission for further evaluation of his underlying abscess.  Orthopedic consulted and recommended continued daily wound care and IV antibiotics.  ID has been consulted for recommendation of IV antibiotics,    Re-evaluated by Ortho 5/18, underwent incision and drainage of left proximal forearm wound by Ortho 5/18 tissue sample was sent for culture.   Assessment & Plan:   Active Problems:   Cellulitis of left forearm   Cellulitis of left knee   E coli bacteremia   Severe neutropenia (HCC)   Primary cancer of right upper lobe of lung (HCC)   Thrombocytopenia (HCC)   HTN (hypertension)  1-Sepsis secondary to left forearm cellulitis and lower extremity cellulitis and abscess: Sepsis  physiology resolved. Patient presented with tachycardia, tachypnea, neutropenia, sepsis, infectious process.  Source of infection skin abscess and bacteremia.  2-Severe neutropenia: Secondary to  recent chemotherapy and Sepsis.  WBC 1.3 Oncology following Continue with Granix.  Plan to transfuse one unit platelet and PRBC>   3-E. coli bacteremia: Blood cultures positive on admission with E. Coli Repeated blood cultures 5/12 negative Continue with  Ancef.  4-Cellulitis and Abscess skin near  left knee and  left forearm:  He had I and D  performed in the ED at bedside.  Packing placed as well. Orthopedic consulted and recommending continue with wound care and IV antibiotics. ID consulted for recommendation for antibiotics. Underwent incision and drainage of the left forearm wound 5/18 at bedside.  Tissue sample was sent for culture.  Plastic consulted per Ortho recommendation.   5-Thrombocytopenia: Due to recent chemotherapy and underlying malignancy Received 3 packet of platelets infusion. Transfuse one unit platelet 5/18.  AKI: Creatinine baseline 0.8.  Present with a creatinine of 1.5 Receive IV fluids.  Renal function improved.  Resolved  Hypomagnesemia: Replete IV    Estimated body mass index is 23.93 kg/m as calculated from the following:   Height as of this encounter: 5\' 9"  (1.753 m).   Weight as of this encounter: 73.5 kg.   DVT prophylaxis: SCD Code Status: Full code Family Communication: care discussed with patient. Wife at bedside.  Disposition Plan:  Status is: Inpatient Remains inpatient appropriate because: needs IV antibiotics, needs further evaluation by ortho for abscess. Neutropenia.     Consultants:  ID ortho  Procedures:    Antimicrobials:  Ancef  Subjective: He report pain forearm is controlled. No new complaints.   Objective: Vitals:   05/24/21 1320 05/24/21 2038 05/25/21 0531 05/25/21 1337  BP: 128/82 118/77 126/81 131/80  Pulse: 99 (!) 103 100 (!) 106  Resp: 18 20 18  (!) 22  Temp: 99.1 F (37.3 C) 98.1 F (36.7 C) 99 F (37.2 C) 98.1 F (36.7 C)  TempSrc: Oral   Oral  SpO2: 100% 97% 96% 100%  Weight:  Height:        Intake/Output Summary (Last 24 hours) at 05/25/2021 1347 Last data filed at 05/25/2021 0840 Gross per 24 hour  Intake 900 ml  Output 1550 ml  Net -650 ml    Filed Weights   05/20/21 1101 05/21/21 6767  Weight: 75.6 kg 73.5 kg    Examination:  General exam: NAD Respiratory system: CTA Cardiovascular  system: S,1 S 2 RRR Gastrointestinal system: BS present, soft, nt Central nervous system: Alert, follows command Extremities: no edema Skin: multiples skin abscess, redness forearm, Left LE with wound   Data Reviewed: I have personally reviewed following labs and imaging studies  CBC: Recent Labs  Lab 05/21/21 0457 05/22/21 0444 05/23/21 0347 05/24/21 0740 05/25/21 0800  WBC 0.3* 0.4* 0.6* 1.0* 1.3*  NEUTROABS 0.1* 0.2* 0.3* 0.6* 0.8*  HGB 7.4* 7.3* 7.7* 7.8* 7.5*  HCT 22.7* 22.8* 23.5* 23.5* 23.1*  MCV 86.0 86.4 86.7 86.7 87.2  PLT 18* 22* 11* 11* 10*    Basic Metabolic Panel: Recent Labs  Lab 05/21/21 0457 05/22/21 0444 05/23/21 0347 05/24/21 0740 05/25/21 0800  NA 143 140 144 143 142  K 3.0* 3.2* 3.5 3.7 3.6  CL 112* 109 113* 111 111  CO2 25 25 26 25 26   GLUCOSE 128* 117* 102* 112* 124*  BUN 16 16 16 13 15   CREATININE 0.73 0.60* 0.64 0.64 0.75  CALCIUM 8.2* 8.2* 8.2* 8.3* 8.3*  MG 1.8 1.7 1.6* 1.8 1.8    GFR: Estimated Creatinine Clearance: 93.3 mL/min (by C-G formula based on SCr of 0.75 mg/dL). Liver Function Tests: No results for input(s): AST, ALT, ALKPHOS, BILITOT, PROT, ALBUMIN in the last 168 hours.  No results for input(s): LIPASE, AMYLASE in the last 168 hours. No results for input(s): AMMONIA in the last 168 hours. Coagulation Profile: No results for input(s): INR, PROTIME in the last 168 hours.  Cardiac Enzymes: No results for input(s): CKTOTAL, CKMB, CKMBINDEX, TROPONINI in the last 168 hours. BNP (last 3 results) No results for input(s): PROBNP in the last 8760 hours. HbA1C: No results for input(s): HGBA1C in the last 72 hours. CBG: No results for input(s): GLUCAP in the last 168 hours. Lipid Profile: No results for input(s): CHOL, HDL, LDLCALC, TRIG, CHOLHDL, LDLDIRECT in the last 72 hours. Thyroid Function Tests: No results for input(s): TSH, T4TOTAL, FREET4, T3FREE, THYROIDAB in the last 72 hours. Anemia Panel: No results for  input(s): VITAMINB12, FOLATE, FERRITIN, TIBC, IRON, RETICCTPCT in the last 72 hours. Sepsis Labs: No results for input(s): PROCALCITON, LATICACIDVEN in the last 168 hours.   Recent Results (from the past 240 hour(s))  Culture, blood (Routine x 2)     Status: Abnormal   Collection Time: 05/17/21 11:42 AM   Specimen: BLOOD  Result Value Ref Range Status   Specimen Description   Final    BLOOD PORTA CATH Performed at [facility] [address] [place], [place] [address]    Special Requests   Final    BOTTLES DRAWN AEROBIC AND ANAEROBIC Blood Culture results may not be optimal due to an excessive volume of blood received in culture bottles Performed at [facility] [address] [place], [place] [address]    Culture  Setup Time   Final    GRAM NEGATIVE RODS IN BOTH AEROBIC AND ANAEROBIC BOTTLES CRITICAL RESULT CALLED TO, READ BACK BY AND VERIFIED WITH: M LILLISTON,PHARMD@0330  05/18/21 [place] Performed at [facility], [address], [place], [place] [address]  Culture ESCHERICHIA COLI (A)  Final   Report Status 05/20/2021 FINAL  Final   Organism ID, Bacteria ESCHERICHIA COLI  Final      Susceptibility   Escherichia coli - MIC*    AMPICILLIN <=2 SENSITIVE Sensitive     CEFAZOLIN <=4 SENSITIVE Sensitive     CEFEPIME <=0.12 SENSITIVE Sensitive     CEFTAZIDIME <=1 SENSITIVE Sensitive     CEFTRIAXONE <=0.25 SENSITIVE Sensitive     CIPROFLOXACIN <=0.25 SENSITIVE Sensitive     GENTAMICIN <=1 SENSITIVE Sensitive     IMIPENEM <=0.25 SENSITIVE Sensitive     TRIMETH/SULFA <=20 SENSITIVE Sensitive     AMPICILLIN/SULBACTAM <=2 SENSITIVE Sensitive     PIP/TAZO <=4 SENSITIVE Sensitive     * ESCHERICHIA COLI  Blood Culture ID Panel (Reflexed)     Status: Abnormal   Collection Time: 05/17/21 11:42 AM  Result Value Ref Range Status   Enterococcus faecalis NOT DETECTED NOT DETECTED Final   Enterococcus Faecium NOT DETECTED NOT DETECTED Final    Listeria monocytogenes NOT DETECTED NOT DETECTED Final   Staphylococcus species NOT DETECTED NOT DETECTED Final   Staphylococcus aureus (BCID) NOT DETECTED NOT DETECTED Final   Staphylococcus epidermidis NOT DETECTED NOT DETECTED Final   Staphylococcus lugdunensis NOT DETECTED NOT DETECTED Final   Streptococcus species NOT DETECTED NOT DETECTED Final   Streptococcus agalactiae NOT DETECTED NOT DETECTED Final   Streptococcus pneumoniae NOT DETECTED NOT DETECTED Final   Streptococcus pyogenes NOT DETECTED NOT DETECTED Final   A.calcoaceticus-baumannii NOT DETECTED NOT DETECTED Final   Bacteroides fragilis NOT DETECTED NOT DETECTED Final   Enterobacterales DETECTED (A) NOT DETECTED Final    Comment: Enterobacterales represent a large order of gram negative bacteria, not a single organism. CRITICAL RESULT CALLED TO, READ BACK BY AND VERIFIED WITH: M LILLISTON,PHARMD@0330  05/18/21 [place]    Enterobacter cloacae complex NOT DETECTED NOT DETECTED Final   Escherichia coli DETECTED (A) NOT DETECTED Final    Comment: CRITICAL RESULT CALLED TO, READ BACK BY AND VERIFIED WITH: M LILLISTON,PHARMD@0330  05/18/21 [place]    Klebsiella aerogenes NOT DETECTED NOT DETECTED Final   Klebsiella oxytoca NOT DETECTED NOT DETECTED Final   Klebsiella pneumoniae NOT DETECTED NOT DETECTED Final   Proteus species NOT DETECTED NOT DETECTED Final   Salmonella species NOT DETECTED NOT DETECTED Final   Serratia marcescens NOT DETECTED NOT DETECTED Final   Haemophilus influenzae NOT DETECTED NOT DETECTED Final   Neisseria meningitidis NOT DETECTED NOT DETECTED Final   Pseudomonas aeruginosa NOT DETECTED NOT DETECTED Final   Stenotrophomonas maltophilia NOT DETECTED NOT DETECTED Final   Candida albicans NOT DETECTED NOT DETECTED Final   Candida auris NOT DETECTED NOT DETECTED Final   Candida glabrata NOT DETECTED NOT DETECTED Final   Candida krusei NOT DETECTED NOT DETECTED Final   Candida parapsilosis NOT DETECTED NOT  DETECTED Final   Candida tropicalis NOT DETECTED NOT DETECTED Final   Cryptococcus neoformans/gattii NOT DETECTED NOT DETECTED Final   CTX-M ESBL NOT DETECTED NOT DETECTED Final   Carbapenem resistance IMP NOT DETECTED NOT DETECTED Final   Carbapenem resistance KPC NOT DETECTED NOT DETECTED Final   Carbapenem resistance NDM NOT DETECTED NOT DETECTED Final   Carbapenem resist OXA 48 LIKE NOT DETECTED NOT DETECTED Final   Carbapenem resistance VIM NOT DETECTED NOT DETECTED Final    Comment: Performed at [facility], [address], [place], [place] [address]  Culture, blood (Routine x 2)     Status: None   Collection Time: 05/17/21  6:59 PM   Specimen: BLOOD  Result Value Ref Range Status   Specimen Description   Final    BLOOD Performed at [facility] [address] [place], [place] [address]    Special Requests   Final    IN PEDIATRIC BOTTLE Blood Culture adequate volume Performed at [facility] [address], [place], [place] [address]    Culture   Final    NO GROWTH 5 DAYS Performed at [facility], [place] [address] [place], [place] [address]    Report Status 05/22/2021 FINAL  Final  Culture, blood (Routine X 2) w Reflex to ID Panel     Status: None   Collection Time: 05/19/21  3:50 AM   Specimen: BLOOD  Result Value Ref Range Status   Specimen Description   Final    BLOOD RIGHT WRIST Performed at [facility] [address] [place], [place] [address]    Special Requests   Final    BOTTLES DRAWN AEROBIC ONLY Blood Culture adequate volume Performed at [facility] [address] [place], [place] [address]    Culture   Final    NO GROWTH 5 DAYS Performed at [facility], [place] [address], [place], [place] [address]    Report Status 05/24/2021 FINAL  Final  Culture, blood (Routine X 2) w Reflex to ID Panel     Status: None   Collection Time: 05/19/21  3:50 AM   Specimen:  BLOOD RIGHT HAND  Result Value Ref Range Status   Specimen Description   Final    BLOOD RIGHT HAND Performed at [facility] [address], [place], [place] [address]    Special Requests   Final    BOTTLES DRAWN AEROBIC ONLY Blood Culture adequate volume Performed at [facility] [address], [place], [place] [address]    Culture   Final    NO GROWTH 5 DAYS Performed at [facility], [place] [address], [place], [place] [address]    Report Status 05/24/2021 FINAL  Final          Radiology Studies: No results found.      Scheduled Meds:  sodium chloride   Intravenous Once   sodium chloride   Intravenous Once   Chlorhexidine Gluconate Cloth  6 each Topical Daily   feeding supplement  237 mL Oral TID BM   Tbo-filgastrim (GRANIX) SQ  480 mcg Subcutaneous q1800   Continuous Infusions:   ceFAZolin (ANCEF) IV 2 g (05/25/21 1323)     LOS: 8 days    Time spent: 35 minutes.    Elmarie Shiley, MD [facility]   If 7PM-7AM, please contact night-coverage [email or web address]  05/25/2021, 1:47 PM

## 2021-05-25 NOTE — Procedures (Addendum)
Adam [name] is a 66 y.o. male   Orthopaedic diagnosis: Left knee and elbow abscesses, status post bedside I&D in ED on 05/17/21, with continued pain, swelling, induration  Subjective: Patient continues to have pain and swelling about the left proximal forearm.  Did undergo I&D on 05/17/2021 and the emergency department, but no cultures were obtained. Discussed at length the risk and benefits of bedside wound exploration, drainage of abscess if present, and removal of superficial necrotic tissue with the patient.  He was amenable to proceeding.  This will also serve for organism identification.  All questions were invited and answered.  Diagnosis: abscess - Location: Left proximal forearm  Procedure: Incision & drainage Informed consent:  Discussed risks (permanent loss of nail, permanent irregular growth of nail, infection, pain, bleeding, bruising, numbness, and recurrence of the condition) and benefits of the procedure, as well as the alternatives.  Informed consent was obtained. A time out was performed.   Anesthesia: Local - Approximately 10cc Lidocaine 1% with out epinephrine  Debridement type: Excisional Debridement  Side: left  Body Location: proximal forearm   Tools used for debridement: scalpel and scissors  Pre-debridement Wound size (cm):   Length: 5cm      Width: 5cm  Depth: 1-2cm  Post-debridement Wound size (cm):   Length: 6cm       Width: 6cm   Depth: 2cm  Debridement depth beyond dead/damaged tissue down to healthy viable tissue: yes  Tissue layer involved: skin, subcutaneous tissue  Nature of tissue removed: Necrotic  Irrigation volume: 167ml   Irrigation fluid type: Normal Saline    The medial and lateral proximal forearm wounds were prepared sterilely using Betadine and draped in a standard fashion.  Local anesthesia was achieved about the area of maximal tenderness.  The medial wound was explored, and found to be shallow with no significant deep tracts  or fluid collections.  Devitalized superficial tissue was removed and discarded.  Subcutaneous necrotic tissue was gently removed and debrided and placed into a specimen cup.  This was then handed off to the nurse.  Next, the lateral wound was explored.  Small tracks noted superiorly and distal to the wound.  These were deloculated gently using hemostats, but no purulent tissue was expressed. Both wounds were then irrigated with copious amounts of saline.  The lateral wound was packed with iodoform gauze with tail outside the wound.  The medial wound did not appear amenable to packing.  Vaseline gauze was placed over the wounds followed by a sterile 4 x 4 gauze and an Ace wrap was gently placed.  No complications occurred.  The patient tolerated the procedure well.  No bleeding occurred.  Maintained good neurovascular status distally following procedure.  The patient tolerated the procedure well. The patient was instructed on post-procedure care, and worsening signs and symptoms.  Plan: The wound does not appear deep after formal bedside exploration.  There was no significant fluid collections officially.  The wounds did not appear to probe deeply.  The patient was encouraged by this.  I did send off some necrotic tissue from the medial wound, and efforts to obtain organism identification.  I do feel that he has full-thickness skin loss about the medial wound of the proximal forearm and that he would benefit from plastic surgery consultation.  I do not see a role in orthopedic surgery at this time, in particular given the patient's complex medical history and overall situation.  -Continue wound care, and daily packing -  IV antibiotics per hospitalist team -Follow cultures.    Charl Wellen J. Martinique, PA-C

## 2021-05-25 NOTE — Progress Notes (Signed)
PT Cancellation Note  Patient Details Name: BARTH TRELLA MRN: [id number] DOB: Apr 17, 1955   Cancelled Treatment:    Reason Eval/Treat Not Completed: Patient not medically ready (Pt's WBC and platelets lows. Plan for transfusion. Will hold and follow up as schedule allows and pt medically ready.)   Verner Mould, DPT Acute Rehabilitation Services Office [phone] Pager [phone]  05/25/21 3:00 PM

## 2021-05-26 ENCOUNTER — Inpatient Hospital Stay (HOSPITAL_COMMUNITY): Payer: Commercial Managed Care - HMO

## 2021-05-26 DIAGNOSIS — N179 Acute kidney failure, unspecified: Secondary | ICD-10-CM | POA: Diagnosis not present

## 2021-05-26 DIAGNOSIS — L02416 Cutaneous abscess of left lower limb: Secondary | ICD-10-CM | POA: Diagnosis not present

## 2021-05-26 DIAGNOSIS — D701 Agranulocytosis secondary to cancer chemotherapy: Secondary | ICD-10-CM | POA: Diagnosis not present

## 2021-05-26 DIAGNOSIS — L02414 Cutaneous abscess of left upper limb: Secondary | ICD-10-CM | POA: Diagnosis not present

## 2021-05-26 HISTORY — PX: IR REMOVAL TUN ACCESS W/ PORT W/O FL MOD SED: IMG2290

## 2021-05-26 LAB — BPAM RBC
Blood Product Expiration Date: 202306132359
Blood Product Expiration Date: 202306152359
ISSUE DATE / TIME: 202305151642
ISSUE DATE / TIME: 202305181645
Unit Type and Rh: 5100
Unit Type and Rh: 5100

## 2021-05-26 LAB — TYPE AND SCREEN
ABO/RH(D): O POS
Antibody Screen: NEGATIVE
Unit division: 0
Unit division: 0

## 2021-05-26 LAB — PREPARE PLATELET PHERESIS: Unit division: 0

## 2021-05-26 LAB — BPAM PLATELET PHERESIS
Blood Product Expiration Date: 202305212359
ISSUE DATE / TIME: 202305182249
Unit Type and Rh: 7300

## 2021-05-26 LAB — CBC WITH DIFFERENTIAL/PLATELET
Abs Immature Granulocytes: 0.02 10*3/uL (ref 0.00–0.07)
Basophils Absolute: 0 10*3/uL (ref 0.0–0.1)
Basophils Relative: 1 %
Eosinophils Absolute: 0 10*3/uL (ref 0.0–0.5)
Eosinophils Relative: 0 %
HCT: 25.6 % — ABNORMAL LOW (ref 39.0–52.0)
Hemoglobin: 8.3 g/dL — ABNORMAL LOW (ref 13.0–17.0)
Immature Granulocytes: 1 %
Lymphocytes Relative: 19 %
Lymphs Abs: 0.4 10*3/uL — ABNORMAL LOW (ref 0.7–4.0)
MCH: 28.5 pg (ref 26.0–34.0)
MCHC: 32.4 g/dL (ref 30.0–36.0)
MCV: 88 fL (ref 80.0–100.0)
Monocytes Absolute: 0.3 10*3/uL (ref 0.1–1.0)
Monocytes Relative: 14 %
Neutro Abs: 1.3 10*3/uL — ABNORMAL LOW (ref 1.7–7.7)
Neutrophils Relative %: 65 %
Platelets: 43 10*3/uL — ABNORMAL LOW (ref 150–400)
RBC: 2.91 MIL/uL — ABNORMAL LOW (ref 4.22–5.81)
RDW: 15 % (ref 11.5–15.5)
WBC: 2 10*3/uL — ABNORMAL LOW (ref 4.0–10.5)
nRBC: 1 % — ABNORMAL HIGH (ref 0.0–0.2)

## 2021-05-26 LAB — BASIC METABOLIC PANEL
Anion gap: 5 (ref 5–15)
BUN: 14 mg/dL (ref 8–23)
CO2: 27 mmol/L (ref 22–32)
Calcium: 8.5 mg/dL — ABNORMAL LOW (ref 8.9–10.3)
Chloride: 109 mmol/L (ref 98–111)
Creatinine, Ser: 0.77 mg/dL (ref 0.61–1.24)
GFR, Estimated: >60 mL/min (ref >60)
Glucose, Bld: 99 mg/dL (ref 70–99)
Potassium: 3.7 mmol/L (ref 3.5–5.1)
Sodium: 141 mmol/L (ref 135–145)

## 2021-05-26 MED ORDER — LIDOCAINE-EPINEPHRINE (PF) 1 %-1:200000 IJ SOLN
INTRAMUSCULAR | Status: DC | PRN
  Administered 2021-05-26: 10 mL

## 2021-05-26 MED ORDER — LIDOCAINE-EPINEPHRINE 1 %-1:100000 IJ SOLN
INTRAMUSCULAR | Status: AC
  Administered 2021-05-26: 10 mL
  Filled 2021-05-26: qty 1

## 2021-05-26 NOTE — Progress Notes (Addendum)
Adam [name] is a 66 y.o. male   Orthopaedic diagnosis: Left knee and left elbow/forearm wounds  Procedures: Bedside I&D in ED on 5/10/3, repeat beside I&D left forearm and wound debridement 05/26/21  Subjective: Patient underwent bedside I&D left forearm yesterday with wound debridement.  There were no obvious fluid collections adjacent to the wounds nor did they appear to track deeply.  He did have full-thickness necrotic skin loss about the medial wound that was debrided.  Cultures sent are revealing for E. coli.  Susceptibilities to follow. He was also evaluated by plastic surgery yesterday, who felt he may benefit from formal debridement with collagenase, but he is a poor surgical candidate.  At present, he reports he remains fatigued, but is not experiencing any pain in his elbow.  He notes he has pain at the elbow and resting it on hard surfaces.    Objectyive: Vitals:   05/26/21 0135 05/26/21 0638  BP: 131/82 136/85  Pulse: (!) 104 100  Resp: 20 20  Temp: 98.6 F (37 C) 98.9 F (37.2 C)  SpO2: 98% 97%     Exam: Awake and alert Respirations even and unlabored No acute distress  Left elbow shows two wounds, medial and lateral about the elbow and forearm.  There is full-thickness skin necrosis about the medial wound.  Packing in place in the lateral wound. There is surrounding erythema and eschar formation.  There is swelling globally about the forearm.  There are areas of induration and tenderness, but no palpable fluctuance.  No significant drainage or purulence.  Packing was removed and replaced about the lateral wound.  The wounds were then redressed with Vaseline gauze, 4 x 4's, and an Ace wrap.   No significant discomfort with range of motion at the elbow, range of motion is near full.  Strength is equal. +2 palpable radial pulse.   Examination left knee shows small wound lateral and distal to the knee joint, about the region of the fibular head.  A small eschar along  with surrounding erythema.  There is some necrosis of the skin about the wound.  Packing is in place.  It is tender to palpation, but no obvious evidence of recurrent abscess.  The wound was repacked with iodoform and a dressing was placed.  No obvious effusion or tenderness about the knee.  Range of motion at the knee is nonpainful and nearly full.  Calf is soft and nontender.  Sensation is grossly intact.  Palpable +2 pedal pulses.     Assessment: Left elbow/forearm wounds, status post I&D in ED on 05/17/2021, with repeat I&D at bedside on 05/26/2021 Left lateral knee wound, status post I&D in ED on 05/17/2021  Plan: Patient has no significant pain at present.  He has a complex medical situation and has significant pancytopenia.  There is no role for further surgical intervention at this time.  I did discuss that he is at risk for deeper infection and further skin necrosis.    Recommend continuing daily wound care and dressing changes.  Bolster/prop left elbow on pillow to avoid pressure.  Cultures revealing for E. coli, antibiotics per primary team.  Would not plan to follow over the weekend but will re-evaluate him on Monday.  If there are worsening signs and symptoms of deep infection at that time, likely order advanced imaging.  This will be referred to be contrast-enhanced, but I would ask for appropriateness by the primary team for this.  Hopefully that will not  be necessary.    Torrie Namba J. Martin[place], PA-C

## 2021-05-26 NOTE — Progress Notes (Signed)
PROGRESS NOTE    Adam [name]  EPP:[id number] DOB: [birth year] DOA: 05/17/2021 PCP: de [place], [name], MD   Brief Narrative: 66 year old with past medical history significant for metastatic small cell lung cancer, hypertension presented with multiple falls over the last week.  He presented to the cancer center clinic for follow-up and was having swelling in his left knee and left forearm near his elbow.  He was referred to the ER for further evaluation.  He underwent bedside debridement in the ER of his forearm and knee and swelling with packing placed afterwards.  He was started on IV antibiotics.  He was found to be bacteremic with blood cultures growing E. coli.  Orthopedic surgery was consulted on admission for further evaluation of his underlying abscess.  Orthopedic consulted and recommended continued daily wound care and IV antibiotics.  ID has been consulted for recommendation of IV antibiotics,    Re-evaluated by Ortho 5/18, underwent incision and drainage of left proximal forearm wound by Ortho 5/18 tissue sample was sent for culture.   Assessment & Plan:   Active Problems:   Cellulitis of left forearm   Cellulitis of left knee   E coli bacteremia   Severe neutropenia (HCC)   Primary cancer of right upper lobe of lung (HCC)   Thrombocytopenia (HCC)   HTN (hypertension)  1-Sepsis secondary to left forearm cellulitis and lower extremity cellulitis and abscess: Sepsis  physiology resolved. Patient presented with tachycardia, tachypnea, neutropenia, sepsis, infectious process.  Source of infection skin abscess and bacteremia.  2-Severe neutropenia: Secondary to  recent chemotherapy and Sepsis.  WBC 2.0 Oncology following Continue with Granix.    3-E. coli bacteremia: Blood cultures positive on admission with E. Coli Repeated blood cultures 5/12 negative Continue with  Ancef. ID recommend Pot A cath removal. IR consulted.   4-Cellulitis and Abscess skin near  left knee and  left forearm:  -He had I and D  performed in the ED at bedside 5/10.Marland Kitchen  Packing placed as well. -ID consulted for recommendation for antibiotics. -Underwent incision and drainage of the left forearm wound 5/18 at bedside by ortho.  -Culture from wound growing E coli.  -Plastic consulted per Ortho recommendation. Plastic Surgery recommend continuation wound care.  -Continue with IV antibiotics.  -New area induration noticed left thigh. Continue with antibiotics.   5- Anemia // Thrombocytopenia: Due to recent chemotherapy and underlying malignancy Received 3 packet of platelets infusion. Received one unit platelet 5/18. Platelet today at 43.  Received one unit PRBC 5/18. Oncology recommended blood transfusion for hemoglobin less than 8 on platelet transfusion for platelets less than 20  AKI: Creatinine baseline 0.8.  Present with a creatinine of 1.5 Receive IV fluids.  Renal function improved.  Resolved  Hypomagnesemia: Replaced.     Estimated body mass index is 23.93 kg/m as calculated from the following:   Height as of this encounter: 5\' 9"  (1.753 m).   Weight as of this encounter: 73.5 kg.   DVT prophylaxis: SCD Code Status: Full code Family Communication: care discussed with patient. Wife at bedside.  Disposition Plan:  Status is: Inpatient Remains inpatient appropriate because: needs IV antibiotics, needs further evaluation by ortho for abscess. Neutropenia.     Consultants:  ID ortho  Procedures:    Antimicrobials:  Ancef  Subjective: He denies worsening arm , leg pain.  He denies dyspnea.  Objective: Vitals:   05/25/21 2323 05/26/21 0135 05/26/21 0638 05/26/21 1325  BP: 126/82 131/82 136/85 120/78  Pulse: Marland Kitchen)  102 (!) 104 100 (!) 101  Resp: 18 20 20 19   Temp: 99 F (37.2 C) 98.6 F (37 C) 98.9 F (37.2 C) 98.5 F (36.9 C)  TempSrc: Oral Oral Oral Oral  SpO2: 99% 98% 97% 100%  Weight:      Height:        Intake/Output Summary (Last 24 hours) at  05/26/2021 1436 Last data filed at 05/26/2021 1326 Gross per 24 hour  Intake 1906.6 ml  Output 1075 ml  Net 831.6 ml    Filed Weights   05/20/21 1101 05/21/21 0608  Weight: 75.6 kg 73.5 kg    Examination:  General exam: NAD Respiratory system: CTA Cardiovascular system: S 1, S 2 RRR Gastrointestinal system: BS present, soft, nt Central nervous system: Alert, follows command Extremities: no edema Skin: multiples skin abscess, redness forearm, Left LE with wound   Data Reviewed: I have personally reviewed following labs and imaging studies  CBC: Recent Labs  Lab 05/22/21 0444 05/23/21 0347 05/24/21 0740 05/25/21 0800 05/26/21 0454  WBC 0.4* 0.6* 1.0* 1.3* 2.0*  NEUTROABS 0.2* 0.3* 0.6* 0.8* 1.3*  HGB 7.3* 7.7* 7.8* 7.5* 8.3*  HCT 22.8* 23.5* 23.5* 23.1* 25.6*  MCV 86.4 86.7 86.7 87.2 88.0  PLT 22* 11* 11* 10* 43*    Basic Metabolic Panel: Recent Labs  Lab 05/21/21 0457 05/22/21 0444 05/23/21 0347 05/24/21 0740 05/25/21 0800 05/26/21 0454  NA 143 140 144 143 142 141  K 3.0* 3.2* 3.5 3.7 3.6 3.7  CL 112* 109 113* 111 111 109  CO2 25 25 26 25 26 27   GLUCOSE 128* 117* 102* 112* 124* 99  BUN 16 16 16 13 15 14   CREATININE 0.73 0.60* 0.64 0.64 0.75 0.77  CALCIUM 8.2* 8.2* 8.2* 8.3* 8.3* 8.5*  MG 1.8 1.7 1.6* 1.8 1.8  --     GFR: Estimated Creatinine Clearance: 93.3 mL/min (by C-G formula based on SCr of 0.77 mg/dL). Liver Function Tests: No results for input(s): AST, ALT, ALKPHOS, BILITOT, PROT, ALBUMIN in the last 168 hours.  No results for input(s): LIPASE, AMYLASE in the last 168 hours. No results for input(s): AMMONIA in the last 168 hours. Coagulation Profile: No results for input(s): INR, PROTIME in the last 168 hours.  Cardiac Enzymes: No results for input(s): CKTOTAL, CKMB, CKMBINDEX, TROPONINI in the last 168 hours. BNP (last 3 results) No results for input(s): PROBNP in the last 8760 hours. HbA1C: No results for input(s): HGBA1C in the last  72 hours. CBG: No results for input(s): GLUCAP in the last 168 hours. Lipid Profile: No results for input(s): CHOL, HDL, LDLCALC, TRIG, CHOLHDL, LDLDIRECT in the last 72 hours. Thyroid Function Tests: No results for input(s): TSH, T4TOTAL, FREET4, T3FREE, THYROIDAB in the last 72 hours. Anemia Panel: No results for input(s): VITAMINB12, FOLATE, FERRITIN, TIBC, IRON, RETICCTPCT in the last 72 hours. Sepsis Labs: No results for input(s): PROCALCITON, LATICACIDVEN in the last 168 hours.   Recent Results (from the past 240 hour(s))  Culture, blood (Routine x 2)     Status: Abnormal   Collection Time: 05/17/21 11:42 AM   Specimen: BLOOD  Result Value Ref Range Status   Specimen Description   Final    BLOOD PORTA CATH Performed at [facility] [address] [place], [place] [address]    Special Requests   Final    BOTTLES DRAWN AEROBIC AND ANAEROBIC Blood Culture results may not be optimal due to an excessive volume of blood received in culture  bottles Performed at [facility] [address], [place], [place] [address]    Culture  Setup Time   Final    GRAM NEGATIVE RODS IN BOTH AEROBIC AND ANAEROBIC BOTTLES CRITICAL RESULT CALLED TO, READ BACK BY AND VERIFIED WITH: M LILLISTON,PHARMD@[id number]  05/18/21 [place] Performed at [facility], [place] [address] [place], [place] [address]    Culture ESCHERICHIA COLI (A)  Final   Report Status 05/20/2021 FINAL  Final   Organism ID, Bacteria ESCHERICHIA COLI  Final      Susceptibility   Escherichia coli - MIC*    AMPICILLIN <=2 SENSITIVE Sensitive     CEFAZOLIN <=4 SENSITIVE Sensitive     CEFEPIME <=0.12 SENSITIVE Sensitive     CEFTAZIDIME <=1 SENSITIVE Sensitive     CEFTRIAXONE <=0.25 SENSITIVE Sensitive     CIPROFLOXACIN <=0.25 SENSITIVE Sensitive     GENTAMICIN <=1 SENSITIVE Sensitive     IMIPENEM <=0.25 SENSITIVE Sensitive     TRIMETH/SULFA <=20 SENSITIVE Sensitive     AMPICILLIN/SULBACTAM <=2  SENSITIVE Sensitive     PIP/TAZO <=4 SENSITIVE Sensitive     * ESCHERICHIA COLI  Blood Culture ID Panel (Reflexed)     Status: Abnormal   Collection Time: 05/17/21 11:42 AM  Result Value Ref Range Status   Enterococcus faecalis NOT DETECTED NOT DETECTED Final   Enterococcus Faecium NOT DETECTED NOT DETECTED Final   Listeria monocytogenes NOT DETECTED NOT DETECTED Final   Staphylococcus species NOT DETECTED NOT DETECTED Final   Staphylococcus aureus (BCID) NOT DETECTED NOT DETECTED Final   Staphylococcus epidermidis NOT DETECTED NOT DETECTED Final   Staphylococcus lugdunensis NOT DETECTED NOT DETECTED Final   Streptococcus species NOT DETECTED NOT DETECTED Final   Streptococcus agalactiae NOT DETECTED NOT DETECTED Final   Streptococcus pneumoniae NOT DETECTED NOT DETECTED Final   Streptococcus pyogenes NOT DETECTED NOT DETECTED Final   A.calcoaceticus-baumannii NOT DETECTED NOT DETECTED Final   Bacteroides fragilis NOT DETECTED NOT DETECTED Final   Enterobacterales DETECTED (A) NOT DETECTED Final    Comment: Enterobacterales represent a large order of gram negative bacteria, not a single organism. CRITICAL RESULT CALLED TO, READ BACK BY AND VERIFIED WITH: M LILLISTON,PHARMD@0330  05/18/21 [place]    Enterobacter cloacae complex NOT DETECTED NOT DETECTED Final   Escherichia coli DETECTED (A) NOT DETECTED Final    Comment: CRITICAL RESULT CALLED TO, READ BACK BY AND VERIFIED WITH: M LILLISTON,PHARMD@0330  05/18/21 [place]    Klebsiella aerogenes NOT DETECTED NOT DETECTED Final   Klebsiella oxytoca NOT DETECTED NOT DETECTED Final   Klebsiella pneumoniae NOT DETECTED NOT DETECTED Final   Proteus species NOT DETECTED NOT DETECTED Final   Salmonella species NOT DETECTED NOT DETECTED Final   Serratia marcescens NOT DETECTED NOT DETECTED Final   Haemophilus influenzae NOT DETECTED NOT DETECTED Final   Neisseria meningitidis NOT DETECTED NOT DETECTED Final   Pseudomonas aeruginosa NOT DETECTED NOT  DETECTED Final   Stenotrophomonas maltophilia NOT DETECTED NOT DETECTED Final   Candida albicans NOT DETECTED NOT DETECTED Final   Candida auris NOT DETECTED NOT DETECTED Final   Candida glabrata NOT DETECTED NOT DETECTED Final   Candida krusei NOT DETECTED NOT DETECTED Final   Candida parapsilosis NOT DETECTED NOT DETECTED Final   Candida tropicalis NOT DETECTED NOT DETECTED Final   Cryptococcus neoformans/gattii NOT DETECTED NOT DETECTED Final   CTX-M ESBL NOT DETECTED NOT DETECTED Final   Carbapenem resistance IMP NOT DETECTED NOT DETECTED Final   Carbapenem resistance KPC NOT DETECTED NOT DETECTED Final  Carbapenem resistance NDM NOT DETECTED NOT DETECTED Final   Carbapenem resist OXA 48 LIKE NOT DETECTED NOT DETECTED Final   Carbapenem resistance VIM NOT DETECTED NOT DETECTED Final    Comment: Performed at [facility], [place] [address] [place], [place] [address]  Culture, blood (Routine x 2)     Status: None   Collection Time: 05/17/21  6:59 PM   Specimen: BLOOD  Result Value Ref Range Status   Specimen Description   Final    BLOOD Performed at [facility] [address], [place], [place] [address]    Special Requests   Final    IN PEDIATRIC BOTTLE Blood Culture adequate volume Performed at [facility] [address] [place], [place] [address]    Culture   Final    NO GROWTH 5 DAYS Performed at [facility], [place] [address], [place], [place] [address]    Report Status 05/22/2021 FINAL  Final  Culture, blood (Routine X 2) w Reflex to ID Panel     Status: None   Collection Time: 05/19/21  3:50 AM   Specimen: BLOOD  Result Value Ref Range Status   Specimen Description   Final    BLOOD RIGHT WRIST Performed at [facility] [address] [place], [place] [address]    Special Requests   Final    BOTTLES DRAWN AEROBIC ONLY Blood Culture adequate volume Performed at [facility] [address], [place], [place] [address]    Culture   Final    NO GROWTH 5 DAYS Performed at [facility], [place] [address], [place], [place] [address]    Report Status 05/24/2021 FINAL  Final  Culture, blood (Routine X 2) w Reflex to ID Panel     Status: None   Collection Time: 05/19/21  3:50 AM   Specimen: BLOOD RIGHT HAND  Result Value Ref Range Status   Specimen Description   Final    BLOOD RIGHT HAND Performed at [facility] [address], [place], [place] [address]    Special Requests   Final    BOTTLES DRAWN AEROBIC ONLY Blood Culture adequate volume Performed at [facility] [address], [place], [place] [address]    Culture   Final    NO GROWTH 5 DAYS Performed at [facility], [place] [address], [place], [place] [address]    Report Status 05/24/2021 FINAL  Final  Aerobic/Anaerobic Culture w Gram Stain (surgical/deep wound)     Status: None (Preliminary result)   Collection Time: 05/25/21 12:02 PM   Specimen: Tissue  Result Value Ref Range Status   Specimen Description   Final    TISSUE UNDERNEATH LT ELBOW Performed at [facility] [address], [place], [place] [address]    Special Requests   Final    NONE Performed at [facility], [place] [address], [place], [place] [address]    Gram Stain NO WBC SEEN NO ORGANISMS SEEN   Final   Culture   Final    MODERATE ESCHERICHIA COLI CULTURE REINCUBATED FOR BETTER GROWTH SUSCEPTIBILITIES TO FOLLOW Performed at [facility], [address] [place], [place] [address]    Report Status PENDING  Incomplete  Aerobic Culture w Gram Stain (superficial specimen)     Status: None (Preliminary result)   Collection Time: 05/25/21 12:05 PM   Specimen: Abscess  Result Value Ref  Range Status   Specimen Description   Final    ABSCESS LT LAT FOREARM Performed at [facility] [address], [place], [place]  [address]    Special Requests   Final    NONE Performed at [facility], [place] [address], [place], [place] [address]    Gram Stain PENDING  Incomplete   Culture   Final    RARE ESCHERICHIA COLI SUSCEPTIBILITIES TO FOLLOW Performed at [facility], [place] [address], [place], [place] [address]    Report Status PENDING  Incomplete          Radiology Studies: No results found.      Scheduled Meds:  sodium chloride   Intravenous Once   sodium chloride   Intravenous Once   Chlorhexidine Gluconate Cloth  6 each Topical Daily   feeding supplement  237 mL Oral TID BM   Tbo-filgastrim (GRANIX) SQ  480 mcg Subcutaneous q1800   Continuous Infusions:   ceFAZolin (ANCEF) IV 2 g (05/26/21 0636)     LOS: 9 days    Time spent: 35 minutes.    Elmarie Shiley, MD [facility]   If 7PM-7AM, please contact night-coverage [email or web address]  05/26/2021, 2:36 PM

## 2021-05-26 NOTE — Progress Notes (Signed)
[facility]  Date of Admission:  05/17/2021           Reason for visit: Follow up on bacteremia and abscesses  Current antibiotics: Ancef   ASSESSMENT:    66 y.o. male admitted with:  Left knee and left elbow abscess/cellulitis: status post bedside I&D in the ED on 5/10 and no cultures obtained.  Repeat I&D and debridement of necrotic tissue at the bedside by orthopedics 5/18 with cultures growing E coli.. E coli bacteremia: his admission blood culture from [facility] grew E coli and his peripheral cultures were negative.  Repeat peripheral cultures also negative.  Initially suspected that his PAC was colonized with E coli and did not feel strongly that PAC needed removal right now especially in setting of thrombocytopenia.  However, given that abscess/wound cultures also growing E coli raises suspicion that he may have had true bacteremia and would therefore advocate for PAC removal if possible. Pancytopenia: related to chemotherapy.  ANC today is 1.3. Acute kidney injury: presented with creatinine 1.53 and this has resolved.  Antibiotics being dosed appropriately.  Lung cancer: currently on palliative systemic chemotherapy.  RECOMMENDATIONS:    Continue cefazolin PAC removal if able.   If PAC is able to be removed would recommend 48-72 hr holiday prior to any new lines placed Follow cultures Wound care Lab monitoring Dr Gale Journey here as needed over the weekend   Active Problems:   Primary cancer of right upper lobe of lung (HCC)   HTN (hypertension)   Cellulitis of left forearm   Cellulitis of left knee   E coli bacteremia   Thrombocytopenia (HCC)   Severe neutropenia (HCC)    MEDICATIONS:    Scheduled Meds:  sodium chloride   Intravenous Once   sodium chloride   Intravenous Once   Chlorhexidine Gluconate Cloth  6 each Topical Daily   feeding supplement  237 mL Oral TID BM   Tbo-filgastrim (GRANIX) SQ  480 mcg Subcutaneous q1800   Continuous  Infusions:   ceFAZolin (ANCEF) IV 2 g (05/26/21 0636)   PRN Meds:.acetaminophen **OR** [DISCONTINUED] acetaminophen, HYDROcodone-acetaminophen, sodium chloride flush  SUBJECTIVE:   24 hour events:  No acute events Ortho did a bedside I&D yesterday and excisional debridement of left forearm wound Seen by plastics Afebrile WBC improving   Working with therapy today.  Moving a little slower.  No new complaints.  Review of Systems  All other systems reviewed and are negative.    OBJECTIVE:   Blood pressure 136/85, pulse 100, temperature 98.9 F (37.2 C), temperature source Oral, resp. rate 20, height 5\' 9"  (1.753 m), weight 73.5 kg, SpO2 97 %. Body mass index is 23.93 kg/m.  Physical Exam Constitutional:      General: He is not in acute distress. HENT:     Head: Normocephalic and atraumatic.  Eyes:     Extraocular Movements: Extraocular movements intact.     Conjunctiva/sclera: Conjunctivae normal.  Cardiovascular:     Comments: Right sided PAC in place.  Pulmonary:     Effort: Pulmonary effort is normal. No respiratory distress.  Abdominal:     General: There is no distension.     Palpations: Abdomen is soft.     Tenderness: There is no abdominal tenderness.  Musculoskeletal:     Cervical back: Normal range of motion and neck supple.     Comments: Left forearm wrapped in bandage. Left leg wound covered with bandage.  Skin:    General:  Skin is warm and dry.     Findings: No rash.  Neurological:     General: No focal deficit present.     Mental Status: He is alert and oriented to person, place, and time.  Psychiatric:        Mood and Affect: Mood normal.        Behavior: Behavior normal.     Lab Results: Lab Results  Component Value Date   WBC 2.0 (L) 05/26/2021   HGB 8.3 (L) 05/26/2021   HCT 25.6 (L) 05/26/2021   MCV 88.0 05/26/2021   PLT 43 (L) 05/26/2021    Lab Results  Component Value Date   NA 141 05/26/2021   K 3.7 05/26/2021   CO2 27  05/26/2021   GLUCOSE 99 05/26/2021   BUN 14 05/26/2021   CREATININE 0.77 05/26/2021   CALCIUM 8.5 (L) 05/26/2021   GFRNONAA >60 05/26/2021    Lab Results  Component Value Date   ALT 26 05/18/2021   AST 23 05/18/2021   ALKPHOS 44 05/18/2021   BILITOT 1.0 05/18/2021    No results found for: CRP  No results found for: ESRSEDRATE   I have reviewed the micro and lab results in Epic.  Imaging: No results found.   Imaging independently reviewed in Epic.    Raynelle High[facility] for Infectious Disease [facility] [phone] pager 05/26/2021, 11:50 AM

## 2021-05-26 NOTE — Progress Notes (Signed)
Occupational Therapy Treatment Patient Details Name: Adam [name] MRN: [id number] DOB: 22-Jul-1955 Today's Date: 05/26/2021   History of present illness Adam [name] is a 66 y.o. male with medical history significant of metastatic small cell lung cancer, HTN. Presenting with multiple falls over the last week. Today presented to his [facility] clinic for regular follow up and with concerns of left knee and left forearm after falls at home.   OT comments  Treatment focused on improving independence with ADL tasks and activity tolerance. Patient required assistance to move lower legs to the side of the bed but then he could scoot to the edge. Patient min guard to stand with walker and take steps to sink. Patient requesting bathroom. Patient min guard for toilet transfer but min assist for toileting to make sure of thoroughness. Patient unaware that he had sat on BM on toilet seat and required assistance for clean up. His gait is very slow and he needs increased time to initiate and process commands. Therapist had to cue him twice to walk to the sink to wash his hands. Of note - therapist noted a swollen, raised and hardened area on patient's left upper lateral thigh and made RN and MD aware. Patient left in chair with breakfast. He needed increased time to initiate self feeding task. He exhibits a flat affect and predominantly vacant expression when not actively answering a question. Patient continues to report his ex-wife is going to help him at discharge. Patient is unable to live alone and take care of himself - if patient does not have assistance at discharge POC needs to be updated to SNF to maximize patient's physical abilities.    Recommendations for follow up therapy are one component of a multi-disciplinary discharge planning process, led by the attending physician.  Recommendations may be updated based on patient status, additional functional criteria and insurance authorization.    Follow Up  Recommendations  Home health OT    Assistance Recommended at Discharge Frequent or constant Supervision/Assistance  Patient can return home with the following  A little help with walking and/or transfers;A little help with bathing/dressing/bathroom;Assistance with cooking/housework;Direct supervision/assist for financial management;Direct supervision/assist for medications management;Assist for transportation;Help with stairs or ramp for entrance   Equipment Recommendations  BSC/3in1    Recommendations for Other Services      Precautions / Restrictions Precautions Precautions: Fall Precaution Comments: multiple falls, LUE and LLE wound, monitor HR Restrictions Weight Bearing Restrictions: No       Mobility Bed Mobility                    Transfers                         Balance Overall balance assessment: Mild deficits observed, not formally tested                                         ADL either performed or assessed with clinical judgement   ADL Overall ADL's : Needs assistance/impaired Eating/Feeding: Independent   Grooming: Wash/dry hands;Standing Grooming Details (indicate cue type and reason): needed two cues - with increased time to initiate - to walk to sink to wash his hands                 Toilet Transfer: Min Biomedical engineer;Ambulation;Rolling walker (2 wheels)   Toileting-  Clothing Manipulation and Hygiene: Sitting/lateral lean;Sit to/from stand;Minimal assistance Toileting - Clothing Manipulation Details (indicate cue type and reason): Min As for hygiene to ensure thoroughness - patient able to manage clothing and wipe self     Functional mobility during ADLs: Min guard;Rolling walker (2 wheels) General ADL Comments: Overall min guard to ambulate with RW in room. No overt loss of balance. Very slow gait.    Extremity/Trunk Assessment              Vision       Perception     Praxis       Cognition Arousal/Alertness: Awake/alert Behavior During Therapy: Flat affect Overall Cognitive Status: No family/caregiver present to determine baseline cognitive functioning Area of Impairment: Awareness                       Following Commands: Follows one step commands consistently   Awareness: Emergent Problem Solving: Slow processing General Comments: patient was able to follow commands with increased time with increased cues. Very flat affect. Seems        Exercises      Shoulder Instructions       General Comments      Pertinent Vitals/ Pain       Pain Assessment Pain Assessment: Faces Faces Pain Scale: Hurts little more Pain Location: knee wound with palpation Pain Descriptors / Indicators: Discomfort, Grimacing, Sore Pain Intervention(s): Monitored during session  Home Living                                          Prior Functioning/Environment              Frequency  Min 2X/week        Progress Toward Goals  OT Goals(current goals can now be found in the care plan section)  Progress towards OT goals: Progressing toward goals  Acute Rehab OT Goals Patient Stated Goal: get stronger OT Goal Formulation: With patient Time For Goal Achievement: 06/01/21 Potential to Achieve Goals: Good  Plan Discharge plan remains appropriate    Co-evaluation                 AM-PAC OT "6 Clicks" Daily Activity     Outcome Measure   Help from another person eating meals?: None Help from another person taking care of personal grooming?: A Little Help from another person toileting, which includes using toliet, bedpan, or urinal?: A Little Help from another person bathing (including washing, rinsing, drying)?: A Little Help from another person to put on and taking off regular upper body clothing?: A Little Help from another person to put on and taking off regular lower body clothing?: A Little 6 Click Score: 19     End of Session Equipment Utilized During Treatment: Rolling walker (2 wheels)  OT Visit Diagnosis: Unsteadiness on feet (R26.81)   Activity Tolerance Patient tolerated treatment well   Patient Left in chair;with call bell/phone within reach;with chair alarm set;with nursing/sitter in room   Nurse Communication Mobility status (left thigh area swoleen/hard)        Time: 5732-2025 OT Time Calculation (min): 34 min  Charges: OT General Charges $OT Visit: 1 Visit OT Treatments $Self Care/Home Management : 23-37 mins  Aarin Sparkman, OTR/L [place]  Office [phone] Pager: [phone]   Lenward Chancellor 05/26/2021, 11:29 AM

## 2021-05-26 NOTE — Procedures (Signed)
Interventional Radiology Procedure:   Indications: Neutropenia and bacteremia  Procedure: Port removal  Findings: Complete removal of right chest port.  Port pocket was clean and no signs of infection.  Port pocket was closed with absorbable suture and Dermabond.  Complications: No immediate complications noted.     EBL: Minimal  Plan: Keep incision dry for 24 hours.     Adam [name] R. Anselm Pancoast, MD  Pager: [phone]

## 2021-05-26 NOTE — Progress Notes (Signed)
Brief oncology note:  CBC from this morning has been reviewed.  His WBC is 2.0 with an ANC of 1.3.  Recommend continuation of Granix until [place] is 1.5 or higher.  His hemoglobin is 8.3 and platelet count is 43,000 after receiving 1 unit PRBCs and 1 unit of platelets yesterday.  No need for transfusion today.  Recommend PRBC transfusion for hemoglobin less than 8 or platelet transfusion for platelet count less than 20,000 or active bleeding.  ID has made the recommendation to remove Port-A-Cath due to E. coli bacteremia from blood cultures from the Port-A-Cath.  Okay from our standpoint to remove the Port-A-Cath.  CBC    Component Value Date/Time   WBC 2.0 (L) 05/26/2021 0454   RBC 2.91 (L) 05/26/2021 0454   HGB 8.3 (L) 05/26/2021 0454   HGB 7.8 (L) 05/10/2021 0933   HCT 25.6 (L) 05/26/2021 0454   PLT 43 (L) 05/26/2021 0454   PLT 210 05/10/2021 0933   MCV 88.0 05/26/2021 0454   MCH 28.5 05/26/2021 0454   MCHC 32.4 05/26/2021 0454   RDW 15.0 05/26/2021 0454   LYMPHSABS 0.4 (L) 05/26/2021 0454   MONOABS 0.3 05/26/2021 0454   EOSABS 0.0 05/26/2021 0454   BASOSABS 0.0 05/26/2021 0454   Please call medical oncology over the weekend if questions arise.  Mikey Bussing, DNP, AGPCNP-BC, AOCNP

## 2021-05-27 DIAGNOSIS — L03114 Cellulitis of left upper limb: Secondary | ICD-10-CM | POA: Diagnosis not present

## 2021-05-27 DIAGNOSIS — N179 Acute kidney failure, unspecified: Secondary | ICD-10-CM | POA: Diagnosis not present

## 2021-05-27 LAB — BASIC METABOLIC PANEL
Anion gap: 5 (ref 5–15)
BUN: 14 mg/dL (ref 8–23)
CO2: 26 mmol/L (ref 22–32)
Calcium: 8.6 mg/dL — ABNORMAL LOW (ref 8.9–10.3)
Chloride: 110 mmol/L (ref 98–111)
Creatinine, Ser: 0.74 mg/dL (ref 0.61–1.24)
GFR, Estimated: >60 mL/min (ref >60)
Glucose, Bld: 104 mg/dL — ABNORMAL HIGH (ref 70–99)
Potassium: 3.6 mmol/L (ref 3.5–5.1)
Sodium: 141 mmol/L (ref 135–145)

## 2021-05-27 LAB — CBC WITH DIFFERENTIAL/PLATELET
Abs Immature Granulocytes: 0.06 10*3/uL (ref 0.00–0.07)
Basophils Absolute: 0 10*3/uL (ref 0.0–0.1)
Basophils Relative: 1 %
Eosinophils Absolute: 0 10*3/uL (ref 0.0–0.5)
Eosinophils Relative: 0 %
HCT: 26.9 % — ABNORMAL LOW (ref 39.0–52.0)
Hemoglobin: 8.9 g/dL — ABNORMAL LOW (ref 13.0–17.0)
Immature Granulocytes: 2 %
Lymphocytes Relative: 15 %
Lymphs Abs: 0.5 10*3/uL — ABNORMAL LOW (ref 0.7–4.0)
MCH: 29.4 pg (ref 26.0–34.0)
MCHC: 33.1 g/dL (ref 30.0–36.0)
MCV: 88.8 fL (ref 80.0–100.0)
Monocytes Absolute: 0.4 10*3/uL (ref 0.1–1.0)
Monocytes Relative: 12 %
Neutro Abs: 2.2 10*3/uL (ref 1.7–7.7)
Neutrophils Relative %: 70 %
Platelets: 35 10*3/uL — ABNORMAL LOW (ref 150–400)
RBC: 3.03 MIL/uL — ABNORMAL LOW (ref 4.22–5.81)
RDW: 15.2 % (ref 11.5–15.5)
WBC: 3.2 10*3/uL — ABNORMAL LOW (ref 4.0–10.5)
nRBC: 1.6 % — ABNORMAL HIGH (ref 0.0–0.2)

## 2021-05-27 LAB — AEROBIC CULTURE W GRAM STAIN (SUPERFICIAL SPECIMEN): Gram Stain: NONE SEEN

## 2021-05-27 MED ORDER — TBO-FILGRASTIM 480 MCG/0.8ML ~~LOC~~ SOSY
480.0000 ug | PREFILLED_SYRINGE | Freq: Every day | SUBCUTANEOUS | Status: AC
  Administered 2021-05-27: 480 ug via SUBCUTANEOUS
  Filled 2021-05-27: qty 0.8

## 2021-05-27 NOTE — Progress Notes (Signed)
PROGRESS NOTE    Adam [name]  UMP:[id number] DOB: 1955-04-30 DOA: 05/17/2021 PCP: de [place], [name], MD   Brief Narrative: 66 year old with past medical history significant for metastatic small cell lung cancer, hypertension presented with multiple falls over the last week.  He presented to the cancer center clinic for follow-up and was having swelling in his left knee and left forearm near his elbow.  He was referred to the ER for further evaluation.  He underwent bedside debridement in the ER of his forearm and knee and swelling with packing placed afterwards.  He was started on IV antibiotics.  He was found to be bacteremic with blood cultures growing E. coli.  Orthopedic surgery was consulted on admission for further evaluation of his underlying abscess.  Orthopedic consulted and recommended continued daily wound care and IV antibiotics.  ID has been consulted for recommendation of IV antibiotics,    Re-evaluated by Ortho 5/18, underwent incision and drainage of left proximal forearm wound by Ortho 5/18 tissue sample was sent for culture.   Assessment & Plan:   Active Problems:   Cellulitis of left forearm   Cellulitis of left knee   E coli bacteremia   Severe neutropenia (HCC)   Primary cancer of right upper lobe of lung (HCC)   Thrombocytopenia (HCC)   HTN (hypertension)  1-Sepsis secondary to left forearm cellulitis and lower extremity cellulitis and abscess: Sepsis  physiology resolved. Patient presented with tachycardia, tachypnea, neutropenia, sepsis, infectious process.  Source of infection skin abscess and bacteremia.  2-Severe neutropenia: Secondary to  recent chemotherapy and Sepsis.  WBC 2.0 Oncology following Continue with Granix. Last dose today. ANC 2.2    3-E. coli bacteremia: Blood cultures positive on admission with E. Coli Repeated blood cultures 5/12 negative Continue with  Ancef. Port A cath removed by IR 5/19. Needs line holiday for 2-3 days.    4-Cellulitis and Abscess skin near  left knee and left forearm:  -He had I and D  performed in the ED at bedside 5/10.Marland Kitchen  Packing placed as well. -ID consulted for recommendation for antibiotics. -Underwent incision and drainage of the left forearm wound 5/18 at bedside by ortho.  -Culture from wound growing E coli.  -Plastic consulted per Ortho recommendation. Plastic Surgery recommend continuation wound care.  -Continue with IV antibiotics. Follow ID recommendation -New area induration noticed left thigh. Continue with antibiotics.   5- Anemia // Thrombocytopenia: Due to recent chemotherapy and underlying malignancy Received 3 packet of platelets infusion. Received one unit platelet 5/18. Platelet today at 43.  Received one unit PRBC 5/18. Oncology recommended blood transfusion for hemoglobin less than 8 on platelet transfusion for platelets less than 20 Monitor.   AKI: Creatinine baseline 0.8.  Present with a creatinine of 1.5 Receive IV fluids.  Renal function improved.  Resolved  Hypomagnesemia: Replaced.     Estimated body mass index is 23.93 kg/m as calculated from the following:   Height as of this encounter: 5\' 9"  (1.753 m).   Weight as of this encounter: 73.5 kg.   DVT prophylaxis: SCD Code Status: Full code Family Communication: care discussed with patient. Wife at bedside.  Disposition Plan:  Status is: Inpatient Remains inpatient appropriate because: needs IV antibiotics, needs further evaluation by ortho for abscess. Neutropenia.     Consultants:  ID ortho  Procedures:    Antimicrobials:  Ancef  Subjective: He is feeling ok, arm pain better.  Denies dyspnea.   Objective: Vitals:   05/26/21 1719  05/26/21 2027 05/27/21 0505 05/27/21 1305  BP:  118/71 (!) 150/88 133/84  Pulse:  97 98 98  Resp:  20 18 18   Temp: 99.2 F (37.3 C) 98.4 F (36.9 C) 98.3 F (36.8 C) 98.4 F (36.9 C)  TempSrc: Oral Oral Oral Oral  SpO2:  95% 97% 97%  Weight:       Height:        Intake/Output Summary (Last 24 hours) at 05/27/2021 1357 Last data filed at 05/27/2021 1000 Gross per 24 hour  Intake 620 ml  Output 1000 ml  Net -380 ml    Filed Weights   05/20/21 1101 05/21/21 0608  Weight: 75.6 kg 73.5 kg    Examination:  General exam: NAD Respiratory system: CTA Cardiovascular system: S 1, S 2 RRR Gastrointestinal system: BS present, soft, nt Central nervous system: Alert, follows command Extremities: no edema Skin: multiples skin abscess, redness forearm, Left LE with wound   Data Reviewed: I have personally reviewed following labs and imaging studies  CBC: Recent Labs  Lab 05/23/21 0347 05/24/21 0740 05/25/21 0800 05/26/21 0454 05/27/21 0428  WBC 0.6* 1.0* 1.3* 2.0* 3.2*  NEUTROABS 0.3* 0.6* 0.8* 1.3* 2.2  HGB 7.7* 7.8* 7.5* 8.3* 8.9*  HCT 23.5* 23.5* 23.1* 25.6* 26.9*  MCV 86.7 86.7 87.2 88.0 88.8  PLT 11* 11* 10* 43* 35*    Basic Metabolic Panel: Recent Labs  Lab 05/21/21 0457 05/22/21 0444 05/23/21 0347 05/24/21 0740 05/25/21 0800 05/26/21 0454 05/27/21 0428  NA 143 140 144 143 142 141 141  K 3.0* 3.2* 3.5 3.7 3.6 3.7 3.6  CL 112* 109 113* 111 111 109 110  CO2 25 25 26 25 26 27 26   GLUCOSE 128* 117* 102* 112* 124* 99 104*  BUN 16 16 16 13 15 14 14   CREATININE 0.73 0.60* 0.64 0.64 0.75 0.77 0.74  CALCIUM 8.2* 8.2* 8.2* 8.3* 8.3* 8.5* 8.6*  MG 1.8 1.7 1.6* 1.8 1.8  --   --     GFR: Estimated Creatinine Clearance: 93.3 mL/min (by C-G formula based on SCr of 0.74 mg/dL). Liver Function Tests: No results for input(s): AST, ALT, ALKPHOS, BILITOT, PROT, ALBUMIN in the last 168 hours.  No results for input(s): LIPASE, AMYLASE in the last 168 hours. No results for input(s): AMMONIA in the last 168 hours. Coagulation Profile: No results for input(s): INR, PROTIME in the last 168 hours.  Cardiac Enzymes: No results for input(s): CKTOTAL, CKMB, CKMBINDEX, TROPONINI in the last 168 hours. BNP (last 3  results) No results for input(s): PROBNP in the last 8760 hours. HbA1C: No results for input(s): HGBA1C in the last 72 hours. CBG: No results for input(s): GLUCAP in the last 168 hours. Lipid Profile: No results for input(s): CHOL, HDL, LDLCALC, TRIG, CHOLHDL, LDLDIRECT in the last 72 hours. Thyroid Function Tests: No results for input(s): TSH, T4TOTAL, FREET4, T3FREE, THYROIDAB in the last 72 hours. Anemia Panel: No results for input(s): VITAMINB12, FOLATE, FERRITIN, TIBC, IRON, RETICCTPCT in the last 72 hours. Sepsis Labs: No results for input(s): PROCALCITON, LATICACIDVEN in the last 168 hours.   Recent Results (from the past 240 hour(s))  Culture, blood (Routine x 2)     Status: None   Collection Time: 05/17/21  6:59 PM   Specimen: BLOOD  Result Value Ref Range Status   Specimen Description   Final    BLOOD Performed at [facility] [address], [place], [place] [address]    Special Requests   Final  IN PEDIATRIC BOTTLE Blood Culture adequate volume Performed at [facility] [address] [place], [place] [address]    Culture   Final    NO GROWTH 5 DAYS Performed at [facility], [place] [address], [place], [place] [address]    Report Status 05/22/2021 FINAL  Final  Culture, blood (Routine X 2) w Reflex to ID Panel     Status: None   Collection Time: 05/19/21  3:50 AM   Specimen: BLOOD  Result Value Ref Range Status   Specimen Description   Final    BLOOD RIGHT WRIST Performed at [facility] [address], [place], [place] [address]    Special Requests   Final    BOTTLES DRAWN AEROBIC ONLY Blood Culture adequate volume Performed at [facility] [address], [place], [place] [address]    Culture   Final    NO GROWTH 5 DAYS Performed at [facility], [place] [address], [place], [place] [address]    Report Status 05/24/2021 FINAL  Final  Culture, blood (Routine X 2) w  Reflex to ID Panel     Status: None   Collection Time: 05/19/21  3:50 AM   Specimen: BLOOD RIGHT HAND  Result Value Ref Range Status   Specimen Description   Final    BLOOD RIGHT HAND Performed at [facility] [address] [place], [place] [address]    Special Requests   Final    BOTTLES DRAWN AEROBIC ONLY Blood Culture adequate volume Performed at [facility] [address] [place], [place] [address]    Culture   Final    NO GROWTH 5 DAYS Performed at [facility], [place] [address] [place], [place] [address]    Report Status 05/24/2021 FINAL  Final  Aerobic/Anaerobic Culture w Gram Stain (surgical/deep wound)     Status: None (Preliminary result)   Collection Time: 05/25/21 12:02 PM   Specimen: Tissue  Result Value Ref Range Status   Specimen Description   Final    TISSUE UNDERNEATH LT ELBOW Performed at [facility] [address] [place], [place] [address]    Special Requests   Final    NONE Performed at [facility], [place] [address] [place], [place] [address]    Gram Stain   Final    NO WBC SEEN NO ORGANISMS SEEN Performed at [facility], [place] [address] [place],  [address]    Culture   Final    MODERATE ESCHERICHIA COLI NO ANAEROBES ISOLATED; CULTURE IN PROGRESS FOR 5 DAYS    Report Status PENDING  Incomplete   Organism ID, Bacteria ESCHERICHIA COLI  Final      Susceptibility   Escherichia coli - MIC*    AMPICILLIN <=2 SENSITIVE Sensitive     CEFAZOLIN <=4 SENSITIVE Sensitive     CEFEPIME <=0.12 SENSITIVE Sensitive     CEFTAZIDIME <=1 SENSITIVE Sensitive     CEFTRIAXONE <=0.25 SENSITIVE Sensitive     CIPROFLOXACIN <=0.25 SENSITIVE Sensitive     GENTAMICIN <=1 SENSITIVE Sensitive     IMIPENEM <=0.25 SENSITIVE Sensitive     TRIMETH/SULFA <=20 SENSITIVE Sensitive     AMPICILLIN/SULBACTAM <=2 SENSITIVE Sensitive     PIP/TAZO <=4 SENSITIVE Sensitive     *  MODERATE ESCHERICHIA COLI  Aerobic Culture w Gram Stain (superficial specimen)     Status: None   Collection Time: 05/25/21 12:05 PM  Specimen: Abscess  Result Value Ref Range Status   Specimen Description   Final    ABSCESS LT LAT FOREARM Performed at [facility] [address], [place], [place] [address]    Special Requests   Final    NONE Performed at [facility], [place] [address], [place], [place] [address]    Gram Stain   Final    NO WBC SEEN RARE GRAM NEGATIVE RODS Performed at [facility], [place] [address] [place], [place] [address]    Culture RARE ESCHERICHIA COLI  Final   Report Status 05/27/2021 FINAL  Final   Organism ID, Bacteria ESCHERICHIA COLI  Final      Susceptibility   Escherichia coli - MIC*    AMPICILLIN <=2 SENSITIVE Sensitive     CEFAZOLIN <=4 SENSITIVE Sensitive     CEFEPIME <=0.12 SENSITIVE Sensitive     CEFTAZIDIME <=1 SENSITIVE Sensitive     CEFTRIAXONE <=0.25 SENSITIVE Sensitive     CIPROFLOXACIN <=0.25 SENSITIVE Sensitive     GENTAMICIN <=1 SENSITIVE Sensitive     IMIPENEM <=0.25 SENSITIVE Sensitive     TRIMETH/SULFA <=20 SENSITIVE Sensitive     AMPICILLIN/SULBACTAM <=2 SENSITIVE Sensitive     PIP/TAZO <=4 SENSITIVE Sensitive     * RARE ESCHERICHIA COLI          Radiology Studies: IR REMOVAL TUN ACCESS W/ PORT W/O FL MOD SED  Result Date: 05/26/2021 INDICATION: 66 year old with lung cancer and bacteremia. Request for Port-A-Cath removal. EXAM: REMOVAL OF RIGHT CHEST PORT-A-CATH MEDICATIONS: % lidocaine ANESTHESIA/SEDATION: None FLUOROSCOPY: None COMPLICATIONS: None immediate. PROCEDURE: Informed written consent was obtained from the patient after a thorough discussion of the procedural risks, benefits and alternatives. All questions were addressed. Maximal Sterile Barrier Technique was utilized including caps, mask, sterile gowns, sterile gloves, sterile drape, hand hygiene and skin antiseptic. A  timeout was performed prior to the initiation of the procedure. The right chest was prepped and draped in a sterile fashion. Lidocaine was utilized for local anesthesia. An incision was made over the previously healed surgical incision. Utilizing blunt dissection, the port catheter and reservoir were removed from the underlying subcutaneous tissue in their entirety. The pocket was irrigated with a copious amount of sterile normal saline. The subcutaneous tissue was closed with 3-0 Vicryl interrupted subcutaneous stitches. A 4-0 Vicryl running subcuticular stitch was utilized to approximate the skin. Dermabond was applied. FINDINGS: Normal appearance of the skin site. Port pocket looked healthy without signs of infection. IMPRESSION: Successful removal of the right chest Port-A-Cath. Electronically Signed   By: Markus Daft M.D.   On: 05/26/2021 17:30        Scheduled Meds:  sodium chloride   Intravenous Once   sodium chloride   Intravenous Once   Chlorhexidine Gluconate Cloth  6 each Topical Daily   feeding supplement  237 mL Oral TID BM   Tbo-filgastrim (GRANIX) SQ  480 mcg Subcutaneous q1800   Continuous Infusions:   ceFAZolin (ANCEF) IV 2 g (05/27/21 1252)     LOS: 10 days    Time spent: 35 minutes.    Elmarie Shiley, MD [facility]   If 7PM-7AM, please contact night-coverage [email or web address]  05/27/2021, 1:57 PM

## 2021-05-27 NOTE — Progress Notes (Signed)
Physical Therapy Treatment Patient Details Name: Adam [name] MRN: [id number] DOB: 1956-01-04 Today's Date: 05/27/2021   History of Present Illness 66 y.o. male with medical history significant of metastatic small cell lung cancer, HTN. Presenting with multiple falls over the last week. Today presented to his CA clinic for regular follow up and with concerns of left knee and left forearm after falls at home. S/P I&D 5/10, repeat I&D 5/18, and port-a-cath removal 5/19.    PT Comments    Progressing with mobility. Tolerated activity well. Pt would benefit from increased activity/mobilizing with nursing supervision, in addition to PT visits.    Recommendations for follow up therapy are one component of a multi-disciplinary discharge planning process, led by the attending physician.  Recommendations may be updated based on patient status, additional functional criteria and insurance authorization.  Follow Up Recommendations  Home health PT     Assistance Recommended at Discharge Frequent or constant Supervision/Assistance  Patient can return home with the following A little help with walking and/or transfers;A little help with bathing/dressing/bathroom;Assist for transportation;Help with stairs or ramp for entrance;Assistance with cooking/housework   Equipment Recommendations       Recommendations for Other Services       Precautions / Restrictions Precautions Precautions: Fall Precaution Comments: multiple falls, LUE and LLE wound, monitor HR Restrictions Weight Bearing Restrictions: No     Mobility  Bed Mobility Overal bed mobility: Needs Assistance Bed Mobility: Supine to Sit, Sit to Supine     Supine to sit: Min guard, HOB elevated Sit to supine: Min assist, HOB elevated   General bed mobility comments: Small amount of assist for LEs back onto bed. Increased time.    Transfers Overall transfer level: Needs assistance Equipment used: Rolling walker (2 wheels) Transfers:  Sit to/from Stand Sit to Stand: Min guard, From elevated surface           General transfer comment: Cues for safety, technique, hand placement. Increased time.    Ambulation/Gait Ambulation/Gait assistance: Min guard Gait Distance (Feet): 135 Feet Assistive device: Rolling walker (2 wheels) Gait Pattern/deviations: Decreased stride length, Decreased step length - right, Decreased step length - left       General Gait Details: Min guard for safety. Slow gait speed. Pt denied dizziness.   Stairs             Wheelchair Mobility    Modified Rankin (Stroke Patients Only)       Balance Overall balance assessment: Needs assistance         Standing balance support: Reliant on assistive device for balance, During functional activity Standing balance-Leahy Scale: Poor                              Cognition Arousal/Alertness: Awake/alert Behavior During Therapy: Flat affect Overall Cognitive Status: No family/caregiver present to determine baseline cognitive functioning                                 General Comments: patient was able to follow commands with increased time and some repeated cues. Very flat affect.        Exercises      General Comments        Pertinent Vitals/Pain Pain Assessment Pain Assessment: No/denies pain    Home Living  Prior Function            PT Goals (current goals can now be found in the care plan section) Progress towards PT goals: Progressing toward goals    Frequency    Min 3X/week      PT Plan Current plan remains appropriate    Co-evaluation              AM-PAC PT "6 Clicks" Mobility   Outcome Measure  Help needed turning from your back to your side while in a flat bed without using bedrails?: A Little Help needed moving from lying on your back to sitting on the side of a flat bed without using bedrails?: A Little Help needed moving  to and from a bed to a chair (including a wheelchair)?: A Little Help needed standing up from a chair using your arms (e.g., wheelchair or bedside chair)?: A Little Help needed to walk in hospital room?: A Little Help needed climbing 3-5 steps with a railing? : A Little 6 Click Score: 18    End of Session Equipment Utilized During Treatment: Gait belt Activity Tolerance: Patient tolerated treatment well Patient left: in bed;with call bell/phone within reach;with bed alarm set   PT Visit Diagnosis: Muscle weakness (generalized) (M62.81);Unsteadiness on feet (R26.81);Repeated falls (R29.6)     Time: 0258-5277 PT Time Calculation (min) (ACUTE ONLY): 18 min  Charges:  $Gait Training: 8-22 mins                         Doreatha Massed, PT Acute Rehabilitation  Office: [phone] Pager: [phone]

## 2021-05-28 DIAGNOSIS — N179 Acute kidney failure, unspecified: Secondary | ICD-10-CM | POA: Diagnosis not present

## 2021-05-28 DIAGNOSIS — L03114 Cellulitis of left upper limb: Secondary | ICD-10-CM | POA: Diagnosis not present

## 2021-05-28 LAB — BASIC METABOLIC PANEL
Anion gap: 5 (ref 5–15)
BUN: 14 mg/dL (ref 8–23)
CO2: 27 mmol/L (ref 22–32)
Calcium: 8.6 mg/dL — ABNORMAL LOW (ref 8.9–10.3)
Chloride: 110 mmol/L (ref 98–111)
Creatinine, Ser: 0.73 mg/dL (ref 0.61–1.24)
GFR, Estimated: >60 mL/min (ref >60)
Glucose, Bld: 104 mg/dL — ABNORMAL HIGH (ref 70–99)
Potassium: 3.7 mmol/L (ref 3.5–5.1)
Sodium: 142 mmol/L (ref 135–145)

## 2021-05-28 LAB — CBC WITH DIFFERENTIAL/PLATELET
Abs Immature Granulocytes: 0.36 10*3/uL — ABNORMAL HIGH (ref 0.00–0.07)
Basophils Absolute: 0 10*3/uL (ref 0.0–0.1)
Basophils Relative: 1 %
Eosinophils Absolute: 0 10*3/uL (ref 0.0–0.5)
Eosinophils Relative: 0 %
HCT: 26.8 % — ABNORMAL LOW (ref 39.0–52.0)
Hemoglobin: 8.6 g/dL — ABNORMAL LOW (ref 13.0–17.0)
Immature Granulocytes: 8 %
Lymphocytes Relative: 11 %
Lymphs Abs: 0.5 10*3/uL — ABNORMAL LOW (ref 0.7–4.0)
MCH: 28.6 pg (ref 26.0–34.0)
MCHC: 32.1 g/dL (ref 30.0–36.0)
MCV: 89 fL (ref 80.0–100.0)
Monocytes Absolute: 0.7 10*3/uL (ref 0.1–1.0)
Monocytes Relative: 14 %
Neutro Abs: 3.2 10*3/uL (ref 1.7–7.7)
Neutrophils Relative %: 66 %
Platelets: 34 10*3/uL — ABNORMAL LOW (ref 150–400)
RBC: 3.01 MIL/uL — ABNORMAL LOW (ref 4.22–5.81)
RDW: 15.3 % (ref 11.5–15.5)
WBC: 4.8 10*3/uL (ref 4.0–10.5)
nRBC: 1.1 % — ABNORMAL HIGH (ref 0.0–0.2)

## 2021-05-28 LAB — MAGNESIUM: Magnesium: 1.8 mg/dL (ref 1.7–2.4)

## 2021-05-28 NOTE — Plan of Care (Signed)

## 2021-05-28 NOTE — Progress Notes (Signed)
PROGRESS NOTE    Adam [name]  SNK:[id number] DOB: [birth year] DOA: 05/17/2021 PCP: de [place], [name], MD   Brief Narrative: 66 year old with past medical history significant for metastatic small cell lung cancer, hypertension presented with multiple falls over the last week.  He presented to the cancer center clinic for follow-up and was having swelling in his left knee and left forearm near his elbow.  He was referred to the ER for further evaluation.  He underwent bedside debridement in the ER of his forearm and knee and swelling with packing placed afterwards.  He was started on IV antibiotics.  He was found to be bacteremic with blood cultures growing E. coli.  Orthopedic surgery was consulted on admission for further evaluation of his underlying abscess.  Orthopedic consulted and recommended continued daily wound care and IV antibiotics.  ID has been consulted for recommendation of IV antibiotics,    Re-evaluated by Ortho 5/18, underwent incision and drainage of left proximal forearm wound by Ortho 5/18 tissue sample was sent for culture.   Assessment & Plan:   Active Problems:   Cellulitis of left forearm   Cellulitis of left knee   E coli bacteremia   Severe neutropenia (HCC)   Primary cancer of right upper lobe of lung (HCC)   Thrombocytopenia (HCC)   HTN (hypertension)  1-Sepsis secondary to left forearm cellulitis and lower extremity cellulitis and abscess: Sepsis  physiology resolved. Patient presented with tachycardia, tachypnea, neutropenia, sepsis, infectious process.  Source of infection skin abscess and bacteremia.  2-Severe neutropenia: Secondary to  recent chemotherapy and Sepsis.  Oncology following Treated  with Granix. Last dose 5/20.Marland Kitchen ANC 3.2   3-E. coli bacteremia: Blood cultures positive on admission with E. Coli Repeated blood cultures 5/12 negative Continue with  Ancef. Port A cath removed by IR 5/19. Needs line holiday for 2-3 days.   4-Cellulitis and  Abscess skin near  left knee and left forearm:  -He had I and D  performed in the ED at bedside 5/10.Marland Kitchen  Packing placed as well. -ID consulted for recommendation for antibiotics. -Underwent incision and drainage of the left forearm wound 5/18 at bedside by ortho.  -Culture from wound growing E coli.  -Plastic consulted per Ortho recommendation. Plastic Surgery recommend continuation wound care.  -Continue with IV antibiotics. Follow ID recommendation -New area induration noticed left thigh. Continue with antibiotics.   5- Anemia // Thrombocytopenia: Due to recent chemotherapy and underlying malignancy Received 3 packet of platelets infusion. Received one unit platelet 5/18. Platelet today at 34. Monitor.  Received one unit PRBC 5/18. Oncology recommended blood transfusion for hemoglobin less than 8 on platelet transfusion for platelets less than 20 Monitor.   AKI: Creatinine baseline 0.8.  Present with a creatinine of 1.5 Receive IV fluids.  Renal function improved.  Resolved  Hypomagnesemia: Replaced.     Estimated body mass index is 24.78 kg/m as calculated from the following:   Height as of this encounter: 5\' 9"  (1.753 m).   Weight as of this encounter: 76.1 kg.   DVT prophylaxis: SCD Code Status: Full code Family Communication: care discussed with patient. Wife at bedside.  Disposition Plan:  Status is: Inpatient Remains inpatient appropriate because: needs IV antibiotics, needs further evaluation by ortho for abscess. Neutropenia.     Consultants:  ID ortho  Procedures:    Antimicrobials:  Ancef  Subjective: He is feeling ok, no new complaints.    Objective: Vitals:   05/27/21 1305 05/27/21 2117 05/28/21  0542 05/28/21 1237  BP: 133/84 123/76 132/81 110/72  Pulse: 98 100 95 98  Resp: 18 18 18 16   Temp: 98.4 F (36.9 C) 97.8 F (36.6 C) 98.8 F (37.1 C) 97.9 F (36.6 C)  TempSrc: Oral Oral Oral Oral  SpO2: 97% 97% 99% 98%  Weight:   76.1 kg    Height:        Intake/Output Summary (Last 24 hours) at 05/28/2021 1436 Last data filed at 05/28/2021 1239 Gross per 24 hour  Intake 360 ml  Output 1550 ml  Net -1190 ml    Filed Weights   05/20/21 1101 05/21/21 0608 05/28/21 0542  Weight: 75.6 kg 73.5 kg 76.1 kg    Examination:  General exam: NAD Respiratory system: CTA Cardiovascular system: S 1, S 2 RRR Gastrointestinal system: BS present, soft, nt Central nervous system: Alert, follows command Extremities: no edema Skin: multiples skin abscess, redness forearm, Left LE with wound   Data Reviewed: I have personally reviewed following labs and imaging studies  CBC: Recent Labs  Lab 05/24/21 0740 05/25/21 0800 05/26/21 0454 05/27/21 0428 05/28/21 0325  WBC 1.0* 1.3* 2.0* 3.2* 4.8  NEUTROABS 0.6* 0.8* 1.3* 2.2 3.2  HGB 7.8* 7.5* 8.3* 8.9* 8.6*  HCT 23.5* 23.1* 25.6* 26.9* 26.8*  MCV 86.7 87.2 88.0 88.8 89.0  PLT 11* 10* 43* 35* 34*    Basic Metabolic Panel: Recent Labs  Lab 05/22/21 0444 05/23/21 0347 05/24/21 0740 05/25/21 0800 05/26/21 0454 05/27/21 0428 05/28/21 0325  NA 140 144 143 142 141 141 142  K 3.2* 3.5 3.7 3.6 3.7 3.6 3.7  CL 109 113* 111 111 109 110 110  CO2 25 26 25 26 27 26 27   GLUCOSE 117* 102* 112* 124* 99 104* 104*  BUN 16 16 13 15 14 14 14   CREATININE 0.60* 0.64 0.64 0.75 0.77 0.74 0.73  CALCIUM 8.2* 8.2* 8.3* 8.3* 8.5* 8.6* 8.6*  MG 1.7 1.6* 1.8 1.8  --   --  1.8    GFR: Estimated Creatinine Clearance: 93.3 mL/min (by C-G formula based on SCr of 0.73 mg/dL). Liver Function Tests: No results for input(s): AST, ALT, ALKPHOS, BILITOT, PROT, ALBUMIN in the last 168 hours.  No results for input(s): LIPASE, AMYLASE in the last 168 hours. No results for input(s): AMMONIA in the last 168 hours. Coagulation Profile: No results for input(s): INR, PROTIME in the last 168 hours.  Cardiac Enzymes: No results for input(s): CKTOTAL, CKMB, CKMBINDEX, TROPONINI in the last 168 hours. BNP  (last 3 results) No results for input(s): PROBNP in the last 8760 hours. HbA1C: No results for input(s): HGBA1C in the last 72 hours. CBG: No results for input(s): GLUCAP in the last 168 hours. Lipid Profile: No results for input(s): CHOL, HDL, LDLCALC, TRIG, CHOLHDL, LDLDIRECT in the last 72 hours. Thyroid Function Tests: No results for input(s): TSH, T4TOTAL, FREET4, T3FREE, THYROIDAB in the last 72 hours. Anemia Panel: No results for input(s): VITAMINB12, FOLATE, FERRITIN, TIBC, IRON, RETICCTPCT in the last 72 hours. Sepsis Labs: No results for input(s): PROCALCITON, LATICACIDVEN in the last 168 hours.   Recent Results (from the past 240 hour(s))  Culture, blood (Routine X 2) w Reflex to ID Panel     Status: None   Collection Time: 05/19/21  3:50 AM   Specimen: BLOOD  Result Value Ref Range Status   Specimen Description   Final    BLOOD RIGHT WRIST Performed at [facility] [address], [place], [place] [address]  Special Requests   Final    BOTTLES DRAWN AEROBIC ONLY Blood Culture adequate volume Performed at [facility] [address] [place], [place] [address]    Culture   Final    NO GROWTH 5 DAYS Performed at [facility], [place] [address] [place], [place] [address]    Report Status 05/24/2021 FINAL  Final  Culture, blood (Routine X 2) w Reflex to ID Panel     Status: None   Collection Time: 05/19/21  3:50 AM   Specimen: BLOOD RIGHT HAND  Result Value Ref Range Status   Specimen Description   Final    BLOOD RIGHT HAND Performed at [facility] [address], [place], [place] [address]    Special Requests   Final    BOTTLES DRAWN AEROBIC ONLY Blood Culture adequate volume Performed at [facility] [address], [place], [place] [address]    Culture   Final    NO GROWTH 5 DAYS Performed at [facility], [place] [address] , [place] [address]    Report Status  05/24/2021 FINAL  Final  Aerobic/Anaerobic Culture w Gram Stain (surgical/deep wound)     Status: None (Preliminary result)   Collection Time: 05/25/21 12:02 PM   Specimen: Tissue  Result Value Ref Range Status   Specimen Description   Final    TISSUE UNDERNEATH LT ELBOW Performed at [facility] [address], [place], [place] [address]    Special Requests   Final    NONE Performed at [facility], [place] [address] [place], [place] [address]    Gram Stain   Final    NO WBC SEEN NO ORGANISMS SEEN Performed at [facility], [place] [address], [place], [place] [address]    Culture   Final    MODERATE ESCHERICHIA COLI NO ANAEROBES ISOLATED; CULTURE IN PROGRESS FOR 5 DAYS    Report Status PENDING  Incomplete   Organism ID, Bacteria ESCHERICHIA COLI  Final      Susceptibility   Escherichia coli - MIC*    AMPICILLIN <=2 SENSITIVE Sensitive     CEFAZOLIN <=4 SENSITIVE Sensitive     CEFEPIME <=0.12 SENSITIVE Sensitive     CEFTAZIDIME <=1 SENSITIVE Sensitive     CEFTRIAXONE <=0.25 SENSITIVE Sensitive     CIPROFLOXACIN <=0.25 SENSITIVE Sensitive     GENTAMICIN <=1 SENSITIVE Sensitive     IMIPENEM <=0.25 SENSITIVE Sensitive     TRIMETH/SULFA <=20 SENSITIVE Sensitive     AMPICILLIN/SULBACTAM <=2 SENSITIVE Sensitive     PIP/TAZO <=4 SENSITIVE Sensitive     * MODERATE ESCHERICHIA COLI  Aerobic Culture w Gram Stain (superficial specimen)     Status: None   Collection Time: 05/25/21 12:05 PM   Specimen: Abscess  Result Value Ref Range Status   Specimen Description   Final    ABSCESS LT LAT FOREARM Performed at [facility] [address] [place], [place] [address]    Special Requests   Final    NONE Performed at [facility], [place] [address] [place], [place] [address]    Gram Stain   Final    NO WBC SEEN RARE GRAM NEGATIVE RODS Performed at [facility], [place] [address] [place], [place]  [address]    Culture RARE ESCHERICHIA COLI  Final   Report Status 05/27/2021 FINAL  Final   Organism ID, Bacteria ESCHERICHIA COLI  Final  Susceptibility   Escherichia coli - MIC*    AMPICILLIN <=2 SENSITIVE Sensitive     CEFAZOLIN <=4 SENSITIVE Sensitive     CEFEPIME <=0.12 SENSITIVE Sensitive     CEFTAZIDIME <=1 SENSITIVE Sensitive     CEFTRIAXONE <=0.25 SENSITIVE Sensitive     CIPROFLOXACIN <=0.25 SENSITIVE Sensitive     GENTAMICIN <=1 SENSITIVE Sensitive     IMIPENEM <=0.25 SENSITIVE Sensitive     TRIMETH/SULFA <=20 SENSITIVE Sensitive     AMPICILLIN/SULBACTAM <=2 SENSITIVE Sensitive     PIP/TAZO <=4 SENSITIVE Sensitive     * RARE ESCHERICHIA COLI          Radiology Studies: IR REMOVAL TUN ACCESS W/ PORT W/O FL MOD SED  Result Date: 05/26/2021 INDICATION: 66 year old with lung cancer and bacteremia. Request for Port-A-Cath removal. EXAM: REMOVAL OF RIGHT CHEST PORT-A-CATH MEDICATIONS: % lidocaine ANESTHESIA/SEDATION: None FLUOROSCOPY: None COMPLICATIONS: None immediate. PROCEDURE: Informed written consent was obtained from the patient after a thorough discussion of the procedural risks, benefits and alternatives. All questions were addressed. Maximal Sterile Barrier Technique was utilized including caps, mask, sterile gowns, sterile gloves, sterile drape, hand hygiene and skin antiseptic. A timeout was performed prior to the initiation of the procedure. The right chest was prepped and draped in a sterile fashion. Lidocaine was utilized for local anesthesia. An incision was made over the previously healed surgical incision. Utilizing blunt dissection, the port catheter and reservoir were removed from the underlying subcutaneous tissue in their entirety. The pocket was irrigated with a copious amount of sterile normal saline. The subcutaneous tissue was closed with 3-0 Vicryl interrupted subcutaneous stitches. A 4-0 Vicryl running subcuticular stitch was utilized to approximate the  skin. Dermabond was applied. FINDINGS: Normal appearance of the skin site. Port pocket looked healthy without signs of infection. IMPRESSION: Successful removal of the right chest Port-A-Cath. Electronically Signed   By: Markus Daft M.D.   On: 05/26/2021 17:30        Scheduled Meds:  sodium chloride   Intravenous Once   sodium chloride   Intravenous Once   Chlorhexidine Gluconate Cloth  6 each Topical Daily   feeding supplement  237 mL Oral TID BM   Continuous Infusions:   ceFAZolin (ANCEF) IV 2 g (05/28/21 1321)     LOS: 11 days    Time spent: 35 minutes.    Elmarie Shiley, MD [facility]   If 7PM-7AM, please contact night-coverage [email or web address]  05/28/2021, 2:36 PM

## 2021-05-29 DIAGNOSIS — L02416 Cutaneous abscess of left lower limb: Secondary | ICD-10-CM | POA: Diagnosis not present

## 2021-05-29 DIAGNOSIS — A4151 Sepsis due to Escherichia coli [E. coli]: Secondary | ICD-10-CM | POA: Diagnosis not present

## 2021-05-29 DIAGNOSIS — L080 Pyoderma: Secondary | ICD-10-CM

## 2021-05-29 DIAGNOSIS — L02414 Cutaneous abscess of left upper limb: Secondary | ICD-10-CM | POA: Diagnosis not present

## 2021-05-29 DIAGNOSIS — A4150 Gram-negative sepsis, unspecified: Secondary | ICD-10-CM

## 2021-05-29 LAB — CBC WITH DIFFERENTIAL/PLATELET
Abs Immature Granulocytes: 0.74 10*3/uL — ABNORMAL HIGH (ref 0.00–0.07)
Basophils Absolute: 0.1 10*3/uL (ref 0.0–0.1)
Basophils Relative: 1 %
Eosinophils Absolute: 0 10*3/uL (ref 0.0–0.5)
Eosinophils Relative: 0 %
HCT: 26.6 % — ABNORMAL LOW (ref 39.0–52.0)
Hemoglobin: 8.4 g/dL — ABNORMAL LOW (ref 13.0–17.0)
Immature Granulocytes: 11 %
Lymphocytes Relative: 9 %
Lymphs Abs: 0.6 10*3/uL — ABNORMAL LOW (ref 0.7–4.0)
MCH: 28.3 pg (ref 26.0–34.0)
MCHC: 31.6 g/dL (ref 30.0–36.0)
MCV: 89.6 fL (ref 80.0–100.0)
Monocytes Absolute: 1.2 10*3/uL — ABNORMAL HIGH (ref 0.1–1.0)
Monocytes Relative: 17 %
Neutro Abs: 4.3 10*3/uL (ref 1.7–7.7)
Neutrophils Relative %: 62 %
Platelets: 39 10*3/uL — ABNORMAL LOW (ref 150–400)
RBC: 2.97 MIL/uL — ABNORMAL LOW (ref 4.22–5.81)
RDW: 15.5 % (ref 11.5–15.5)
WBC: 6.9 10*3/uL (ref 4.0–10.5)
nRBC: 0.7 % — ABNORMAL HIGH (ref 0.0–0.2)

## 2021-05-29 LAB — BASIC METABOLIC PANEL
Anion gap: 5 (ref 5–15)
BUN: 12 mg/dL (ref 8–23)
CO2: 25 mmol/L (ref 22–32)
Calcium: 8.5 mg/dL — ABNORMAL LOW (ref 8.9–10.3)
Chloride: 110 mmol/L (ref 98–111)
Creatinine, Ser: 0.63 mg/dL (ref 0.61–1.24)
GFR, Estimated: >60 mL/min (ref >60)
Glucose, Bld: 91 mg/dL (ref 70–99)
Potassium: 4 mmol/L (ref 3.5–5.1)
Sodium: 140 mmol/L (ref 135–145)

## 2021-05-29 MED ORDER — CEFADROXIL 500 MG PO CAPS
1000.0000 mg | ORAL_CAPSULE | Freq: Two times a day (BID) | ORAL | Status: DC
  Administered 2021-05-29 – 2021-06-02 (×9): 1000 mg via ORAL
  Filled 2021-05-29 (×9): qty 2

## 2021-05-29 MED ORDER — CHLORHEXIDINE GLUCONATE CLOTH 2 % EX PADS
6.0000 | MEDICATED_PAD | Freq: Every day | CUTANEOUS | Status: DC
  Administered 2021-05-29 – 2021-06-02 (×5): 6 via TOPICAL

## 2021-05-29 NOTE — Progress Notes (Addendum)
[facility] for Infectious Disease  Date of Admission:  05/17/2021           Reason for visit: Follow up on bacteremia and abscesses  Current antibiotics: 5/22-c cefadroxil  5/14-22 cefazolin 5/11-14 ceftriaxone 5/11-12 vanc    ASSESSMENT:    66 y.o. 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 Plastic surgery had previously seen and suspect the necrosis eschar will fall off eventually   RECOMMENDATIONS:    Reasonable at this time to transition to oral cefadroxil to cover for ongoing soft tissue process Would discuss with surgery to reevaluate lesion including the new upper left thigh to see if further drainage needed. He is at high risk for cdiff and I would like to keep antibiotic course as short as possible to prevent it I would also see if plastic surgery can remove the eschar as that will remain a nidus for those abscesses which abx can't penetrate Continue wound care Discussed with primary team     I spent more than 50 minute reviewing data/chart, and coordinating care and >50% direct face to face time providing counseling/discussing diagnostics/treatment plan with patient  Active Problems:   Primary cancer of right upper lobe  of lung (HCC)   HTN (hypertension)   Cellulitis of left forearm   Cellulitis of left knee   E coli bacteremia   Thrombocytopenia (HCC)   Severe neutropenia (HCC)    MEDICATIONS:    Scheduled Meds:  sodium chloride   Intravenous Once   sodium chloride   Intravenous Once   cefadroxil  1,000 mg Oral BID   Chlorhexidine Gluconate Cloth  6 each Topical Daily   feeding supplement  237 mL Oral TID BM   Continuous Infusions:   PRN Meds:.acetaminophen **OR** [DISCONTINUED] acetaminophen, HYDROcodone-acetaminophen, lidocaine-EPINEPHrine (PF), sodium chloride flush  SUBJECTIVE:   24 hour events:  Feels better No fever chill New lesion left upper thigh No n/v/diarrhea   Review of Systems  All other systems reviewed and are negative.    OBJECTIVE:   Blood pressure 117/79, pulse 98, temperature 97.9 F (36.6 C), temperature source Oral, resp. rate 17, height 5\' 9"  (1.753 m), weight 76.1 kg, SpO2 97 %. Body mass index is 24.78 kg/m.  Physical exam: General: no distress; eating lunch; conversant Heent: normocephalic; per; conj clear Neck supple Cv: rrr no mrg Lungs: normal respiratory effort Abd: s/nt Ext no edema Skin: redress his wound after exam  -----    left upper ext --> distal to antecubital fossa is a indurated/flutuance area medially that still drain purulence with palpation and also surrounding ulcer with eschar; LLE --> left lateral upper thigh is a 6 inch mobile induration, around lateral side of knee also an abscess that drain  purulence on expressing it, and an ulcer with eschar as well Neuro: nonfocal  Central line: none (right chest port removed 5/19 and site is nontender)   Lab Results: Lab Results  Component Value Date   WBC 6.9 05/29/2021   HGB 8.4 (L) 05/29/2021   HCT 26.6 (L) 05/29/2021   MCV 89.6 05/29/2021   PLT 39 (L) 05/29/2021    Lab Results  Component Value Date   NA 140 05/29/2021   K 4.0 05/29/2021   CO2 25 05/29/2021   GLUCOSE 91  05/29/2021   BUN 12 05/29/2021   CREATININE 0.63 05/29/2021   CALCIUM 8.5 (L) 05/29/2021   GFRNONAA >60 05/29/2021    Lab Results  Component Value Date   ALT 26 05/18/2021   AST 23 05/18/2021   ALKPHOS 44 05/18/2021   BILITOT 1.0 05/18/2021    No results found for: CRP  No results found for: ESRSEDRATE   I have reviewed the micro and lab results in Epic.  Imaging: No results found.   Imaging independently reviewed in Epic.          Jabier Mutton, [facility] [phone]  pager   [phone] cell 05/29/2021, 1:54 PM

## 2021-05-29 NOTE — Progress Notes (Signed)
PROGRESS NOTE    Adam [name]  YCX:[id number] DOB: 1955-07-19 DOA: 05/17/2021 PCP: de [place], [name], MD   Brief Narrative: 66 year old with past medical history significant for metastatic small cell lung cancer, hypertension presented with multiple falls over the last week.  He presented to the cancer center clinic for follow-up and was having swelling in his left knee and left forearm near his elbow.  He was referred to the ER for further evaluation.  He underwent bedside debridement in the ER of his forearm and knee and swelling with packing placed afterwards.  He was started on IV antibiotics.  He was found to be bacteremic with blood cultures growing E. coli.  Orthopedic surgery was consulted on admission for further evaluation of his underlying abscess.  Orthopedic consulted and recommended continued daily wound care and IV antibiotics.  ID has been consulted for recommendation of IV antibiotics.    Re-evaluated by Ortho 5/18, underwent incision and drainage of left proximal forearm wound by Ortho 5/18 tissue sample was sent for culture.   Assessment & Plan:   Active Problems:   Cellulitis of left forearm   Cellulitis of left knee   E coli bacteremia   Severe neutropenia (HCC)   Primary cancer of right upper lobe of lung (HCC)   Thrombocytopenia (HCC)   HTN (hypertension)   Ecthyma gangrenosum   Gram negative septicemia (HCC)  1-Sepsis secondary to left forearm cellulitis and lower extremity cellulitis and abscess: Sepsis  physiology resolved. Patient presented with tachycardia, tachypnea, neutropenia, sepsis, infectious process.  Source of infection skin abscess and bacteremia.  2-Severe neutropenia: Secondary to  recent chemotherapy and Sepsis.  Oncology following Treated  with Granix. Last dose 5/20.Marland Kitchen ANC 3.2   3-E. coli bacteremia: Blood cultures positive on admission with E. Coli Repeated blood cultures 5/12 negative Continue with  Ancef. Port A cath removed by IR  5/19. Needs line holiday for 2-3 days.   4-Cellulitis and Abscess skin near  left knee and left forearm:  -He had I and D  performed in the ED at bedside 5/10.Marland Kitchen  Packing placed as well. -ID consulted for recommendation for antibiotics. -Underwent incision and drainage of the left forearm wound 5/18 at bedside by ortho.  -Culture from wound growing E coli.  -Plastic consulted per Ortho recommendation. Plastic Surgery recommend continuation wound care.  -ID recommended transition to Cefadroxil. Re-consult Ortho for further I and D.  -New area induration noticed left thigh. Continue with antibiotics. Ortho to evaluate.   5- Anemia // Thrombocytopenia: Due to recent chemotherapy and underlying malignancy Received 3 packet of platelets infusion. Received one unit platelet 5/18. Platelet today at 39 recovering . Monitor.  Received one unit PRBC 5/18. Oncology recommended blood transfusion for hemoglobin less than 8 on platelet transfusion for platelets less than 20 Monitor.   AKI: Creatinine baseline 0.8.  Present with a creatinine of 1.5 Receive IV fluids.  Renal function improved.  Resolved  Hypomagnesemia: Replaced.     Estimated body mass index is 24.78 kg/m as calculated from the following:   Height as of this encounter: 5\' 9"  (1.753 m).   Weight as of this encounter: 76.1 kg.   DVT prophylaxis: SCD Code Status: Full code Family Communication: care discussed with patient.  Disposition Plan:  Status is: Inpatient Remains inpatient appropriate because: needs IV antibiotics, needs further evaluation by ortho for abscess. Neutropenia.     Consultants:  ID ortho  Procedures:    Antimicrobials:  Ancef  Subjective: No  new complaints, pain controlled.   Objective: Vitals:   05/28/21 1237 05/28/21 2133 05/29/21 0527 05/29/21 1315  BP: 110/72 125/76 132/76 117/79  Pulse: 98 95 90 98  Resp: 16 20 18 17   Temp: 97.9 F (36.6 C) 98.5 F (36.9 C) 97.7 F (36.5 C) 97.9  F (36.6 C)  TempSrc: Oral Oral Oral Oral  SpO2: 98% 98% 97% 97%  Weight:      Height:        Intake/Output Summary (Last 24 hours) at 05/29/2021 1410 Last data filed at 05/29/2021 1015 Gross per 24 hour  Intake 120 ml  Output 625 ml  Net -505 ml    Filed Weights   05/20/21 1101 05/21/21 0608 05/28/21 0542  Weight: 75.6 kg 73.5 kg 76.1 kg    Examination:  General exam: NAD Respiratory system: CTA Cardiovascular system: S 1, S 2 RRR Gastrointestinal system: BS present, soft, nt Central nervous system: Alert, follows command Extremities: no edema Skin: multiples skin abscess, redness forearm, Left LE with wound   Data Reviewed: I have personally reviewed following labs and imaging studies  CBC: Recent Labs  Lab 05/25/21 0800 05/26/21 0454 05/27/21 0428 05/28/21 0325 05/29/21 0357  WBC 1.3* 2.0* 3.2* 4.8 6.9  NEUTROABS 0.8* 1.3* 2.2 3.2 4.3  HGB 7.5* 8.3* 8.9* 8.6* 8.4*  HCT 23.1* 25.6* 26.9* 26.8* 26.6*  MCV 87.2 88.0 88.8 89.0 89.6  PLT 10* 43* 35* 34* 39*    Basic Metabolic Panel: Recent Labs  Lab 05/23/21 0347 05/24/21 0740 05/25/21 0800 05/26/21 0454 05/27/21 0428 05/28/21 0325 05/29/21 0356  NA 144 143 142 141 141 142 140  K 3.5 3.7 3.6 3.7 3.6 3.7 4.0  CL 113* 111 111 109 110 110 110  CO2 26 25 26 27 26 27 25   GLUCOSE 102* 112* 124* 99 104* 104* 91  BUN 16 13 15 14 14 14 12   CREATININE 0.64 0.64 0.75 0.77 0.74 0.73 0.63  CALCIUM 8.2* 8.3* 8.3* 8.5* 8.6* 8.6* 8.5*  MG 1.6* 1.8 1.8  --   --  1.8  --     GFR: Estimated Creatinine Clearance: 93.3 mL/min (by C-G formula based on SCr of 0.63 mg/dL). Liver Function Tests: No results for input(s): AST, ALT, ALKPHOS, BILITOT, PROT, ALBUMIN in the last 168 hours.  No results for input(s): LIPASE, AMYLASE in the last 168 hours. No results for input(s): AMMONIA in the last 168 hours. Coagulation Profile: No results for input(s): INR, PROTIME in the last 168 hours.  Cardiac Enzymes: No results  for input(s): CKTOTAL, CKMB, CKMBINDEX, TROPONINI in the last 168 hours. BNP (last 3 results) No results for input(s): PROBNP in the last 8760 hours. HbA1C: No results for input(s): HGBA1C in the last 72 hours. CBG: No results for input(s): GLUCAP in the last 168 hours. Lipid Profile: No results for input(s): CHOL, HDL, LDLCALC, TRIG, CHOLHDL, LDLDIRECT in the last 72 hours. Thyroid Function Tests: No results for input(s): TSH, T4TOTAL, FREET4, T3FREE, THYROIDAB in the last 72 hours. Anemia Panel: No results for input(s): VITAMINB12, FOLATE, FERRITIN, TIBC, IRON, RETICCTPCT in the last 72 hours. Sepsis Labs: No results for input(s): PROCALCITON, LATICACIDVEN in the last 168 hours.   Recent Results (from the past 240 hour(s))  Aerobic/Anaerobic Culture w Gram Stain (surgical/deep wound)     Status: None (Preliminary result)   Collection Time: 05/25/21 12:02 PM   Specimen: Tissue  Result Value Ref Range Status   Specimen Description   Final    TISSUE UNDERNEATH  LT ELBOW Performed at [facility], [place] [address], [place], [place] [address]    Special Requests   Final    NONE Performed at [facility], [place] [address] [place], [place] [address]    Gram Stain NO WBC SEEN NO ORGANISMS SEEN   Final   Culture   Final    MODERATE ESCHERICHIA COLI NO ANAEROBES ISOLATED; CULTURE IN PROGRESS FOR 5 DAYS CRITICAL RESULT CALLED TO, READ BACK BY AND VERIFIED WITH: RN CELIA CALLENDER [phone] FCP Performed at [facility], [place] [address], [place], [place] [address]    Report Status PENDING  Incomplete   Organism ID, Bacteria ESCHERICHIA COLI  Final      Susceptibility   Escherichia coli - MIC*    AMPICILLIN <=2 SENSITIVE Sensitive     CEFAZOLIN <=4 SENSITIVE Sensitive     CEFEPIME <=0.12 SENSITIVE Sensitive     CEFTAZIDIME <=1 SENSITIVE Sensitive     CEFTRIAXONE <=0.25 SENSITIVE Sensitive     CIPROFLOXACIN <=0.25 SENSITIVE Sensitive      GENTAMICIN <=1 SENSITIVE Sensitive     IMIPENEM <=0.25 SENSITIVE Sensitive     TRIMETH/SULFA <=20 SENSITIVE Sensitive     AMPICILLIN/SULBACTAM <=2 SENSITIVE Sensitive     PIP/TAZO <=4 SENSITIVE Sensitive     * MODERATE ESCHERICHIA COLI  Aerobic Culture w Gram Stain (superficial specimen)     Status: None   Collection Time: 05/25/21 12:05 PM   Specimen: Abscess  Result Value Ref Range Status   Specimen Description   Final    ABSCESS LT LAT FOREARM Performed at [place] [address] [place], [place] [address]    Special Requests   Final    NONE Performed at [facility], [place] [address], [place], [place] [address]    Gram Stain   Final    NO WBC SEEN RARE GRAM NEGATIVE RODS Performed at [facility], [place] [address] [place], [place] [address]    Culture RARE ESCHERICHIA COLI  Final   Report Status 05/27/2021 FINAL  Final   Organism ID, Bacteria ESCHERICHIA COLI  Final      Susceptibility   Escherichia coli - MIC*    AMPICILLIN <=2 SENSITIVE Sensitive     CEFAZOLIN <=4 SENSITIVE Sensitive     CEFEPIME <=0.12 SENSITIVE Sensitive     CEFTAZIDIME <=1 SENSITIVE Sensitive     CEFTRIAXONE <=0.25 SENSITIVE Sensitive     CIPROFLOXACIN <=0.25 SENSITIVE Sensitive     GENTAMICIN <=1 SENSITIVE Sensitive     IMIPENEM <=0.25 SENSITIVE Sensitive     TRIMETH/SULFA <=20 SENSITIVE Sensitive     AMPICILLIN/SULBACTAM <=2 SENSITIVE Sensitive     PIP/TAZO <=4 SENSITIVE Sensitive     * RARE ESCHERICHIA COLI          Radiology Studies: No results found.      Scheduled Meds:  sodium chloride   Intravenous Once   sodium chloride   Intravenous Once   cefadroxil  1,000 mg Oral BID   Chlorhexidine Gluconate Cloth  6 each Topical Daily   feeding supplement  237 mL Oral TID BM   Continuous Infusions:     LOS: 12 days    Time spent: 35 minutes.    Elmarie Shiley, MD [facility]   If 7PM-7AM, please contact  night-coverage [email or web address]  05/29/2021, 2:10 PM

## 2021-05-29 NOTE — TOC Progression Note (Signed)
Transition of Care [facility]) - Progression Note    Patient Details  Name: Adam [name] MRN: [id number] Date of Birth: December 16, 1955  [facility] CM/SW Contact  Leeroy Cha, RN Phone Number: 05/29/2021, 10:05 AM  Clinical Narrative:     Following for toc needs.  Expected Discharge Plan: [place] Barriers to Discharge: No Barriers Identified  Expected Discharge Plan and Services Expected Discharge Plan: [place] arrangements for the past 2 months: Single Family Home                                       Social Determinants of Health (SDOH) Interventions    Readmission Risk Interventions     View : No data to display.

## 2021-05-29 NOTE — Progress Notes (Signed)
Adam [name] is a 66 y.o. male   Orthopaedic diagnosis: Left knee and left elbow/forearm wounds   Procedures: Bedside I&D in ED on 5/10/3, repeat beside I&D left forearm and wound debridement 05/26/21  Subjective: Patient appears comfortable in bed.  He is resting quietly.  When awakened, he reports he is not experiencing much pain.  White blood cell count trended up over the weekend.  Continues on IV antibiotics per infectious disease.  There is concern for worsening left knee wound and new area of induration about the lateral aspect of the left thigh.  The patient has been evaluated by plastic surgery for skin necrosis and wounds, but found to be a poor candidate continued wound care and IV antibiotics as recommended.   Objectyive: Vitals:   05/29/21 0527 05/29/21 1315  BP: 132/76 117/79  Pulse: 90 98  Resp: 18 17  Temp: 97.7 F (36.5 C) 97.9 F (36.6 C)  SpO2: 97% 97%     Exam: Awake and alert Respirations even and unlabored No acute distress  Examination of left elbow shows mild interval improvement in elbow wounds.  Persist with full-thickness skin necrosis about the medial wound.  Erythema and induration of seem to improved as well.  There is no palpable fluctuance or expressible purulence.  Is mildly tender to palpation.  A dressing was reapplied.  Continues to maintain good range of motion and strength at the elbow compared to the contralateral side.  There is no pain with range of motion at the elbow or wrist.  Grip strength is equal.  +2 palpable radial pulse  Examination of the left lower extremity shows an indurated and tender area about the proximal lateral thigh.  There are no open wounds.  It does not appear fluctuant or pulsatile.  Minimal erythema.  The left knee wound does have interval worsening skin necrosis about prior I&D site.  Packing is in place.  There is swelling and erythema about the wound.  It is very tender and there is some mild expressible  purulence.  This appears to be extra-articular in relation to the knee.  There is no obvious effusion.  He tolerates active range of motion at the knee without pain or limitation.  Calf is soft and nontender.  Sensation is grossly intact.  There is a +2 palpable pedal pulse.  Assessment: Left elbow/forearm wounds, status post I&D in ED on 05/17/2021, with repeat I&D at bedside on 05/26/2021, stable Left lateral knee wound, status post I&D in ED on 05/17/2021, with worsening skin necrosis and swelling Indurated area proximal thigh, without skin disruption  Plan: The patient's left elbow wound and full-thickness skin loss from skin necrosis appears stable.  However, he does have a worsening left knee wound may have recurrent abscess formation here.  There is also a new area of induration about the left proximal thigh.  At this point, we recommend CT scan of the left thigh and left knee to further evaluate the extent of the abscess and infection. Preferably, this would be a contrast-enhanced.  However, I would asked the primary team for clearance to make sure that contrast is medically appropriate in this situation.  I am not convinced there is a deep infection that requires formal surgical intervention at this time given his overall complex medical situation.  However, depending on the results of the study, he may benefit from further bedside procedures. Although, I am a bit hesitant as he has went on to develop skin necrosis  following previous I&D   Catlin Doria J. [place], PA-C

## 2021-05-29 NOTE — Progress Notes (Signed)
Physical Therapy Treatment Patient Details Name: Adam [name] MRN: [id number] DOB: 19-Jan-1955 Today's Date: 05/29/2021   History of Present Illness 66 y.o. male with medical history significant of metastatic small cell lung cancer, HTN. Presenting with multiple falls over the last week. Today presented to his [place] clinic for regular follow up and with concerns of left knee and left forearm after falls at home. S/P I&D 5/10, repeat I&D 5/18, and port-a-cath removal 5/19.    PT Comments    Pt progressing well with therapy. Continues to require min assist with bed, transfers, and gait, with moderate verbal cues and extra time to initiate movements. Pt reliant on external support for gait, but progressed from RW to [facility] today with ambulation. PT complete toileting with supervision and min assist for low height of toilet transfers. Pt plans to discharge to family/friend for assist, recommend HHPT. Will continue to progress as able.    Recommendations for follow up therapy are one component of a multi-disciplinary discharge planning process, led by the attending physician.  Recommendations may be updated based on patient status, additional functional criteria and insurance authorization.  Follow Up Recommendations  Home health PT     Assistance Recommended at Discharge Frequent or constant Supervision/Assistance  Patient can return home with the following A little help with walking and/or transfers;A little help with bathing/dressing/bathroom;Assistance with cooking/housework;Direct supervision/assist for medications management;Assist for transportation;Help with stairs or ramp for entrance   Equipment Recommendations  Rolling walker (2 wheels) (shower seat/BSC)    Recommendations for Other Services       Precautions / Restrictions Precautions Precautions: Fall Precaution Comments: multiple falls, LUE and LLE wound, monitor HR Restrictions Weight Bearing Restrictions: No     Mobility  Bed  Mobility               General bed mobility comments: Pt scooted to edge of bed, pt required verbal cues and increased time to sequence. Pt used bedrail to raise trunk. He remained seated for 1-2 min before standing with single cane.    Transfers Overall transfer level: Needs assistance Equipment used: Straight cane Transfers: Sit to/from Stand Sit to Stand: Min assist, Min guard           General transfer comment: Pt required min guard to rise from edge of bed using bilateral UE for power up. Pt required min assist to rise from toilet and cues to use grab bar in bathroom.    Ambulation/Gait Ambulation/Gait assistance: Min assist, Min guard Gait Distance (Feet): 110 Feet Assistive device: Straight cane Gait Pattern/deviations: Step-through pattern, Decreased stride length, Shuffle, Trunk flexed Gait velocity: decr     General Gait Details: Pt walked step through consistently but shortened stride length, and with increased trunk flexion- anterior lean posture. pt's steps low and shuffled, however no LOB noted. pt progressed from min assist to steady to min guard for safety.   Stairs             Wheelchair Mobility    Modified Rankin (Stroke Patients Only)       Balance Overall balance assessment: Needs assistance Sitting-balance support: Feet supported, No upper extremity supported, Single extremity supported Sitting balance-Leahy Scale: Good Sitting balance - Comments: Pt able to perform pericare while leaning on knees with UE for support.   Standing balance support: Reliant on assistive device for balance, During functional activity, Single extremity supported Standing balance-Leahy Scale: Fair  Cognition       Area of Impairment: Attention, Following commands, Awareness, Problem solving                   Current Attention Level: Sustained   Following Commands: Follows one step commands  inconsistently, Follows one step commands with increased time   Awareness: Anticipatory Problem Solving: Slow processing, Decreased initiation, Requires verbal cues, Requires tactile cues          Exercises      General Comments General comments (skin integrity, edema, etc.): 5x Sit to Stand: pt able to complete 5 reps, no UE's, 55 sec      Pertinent Vitals/Pain Pain Assessment Pain Assessment: No/denies pain Pain Intervention(s): Monitored during session    Home Living                          Prior Function            PT Goals (current goals can now be found in the care plan section) Acute Rehab PT Goals Patient Stated Goal: Get stronger PT Goal Formulation: With patient Time For Goal Achievement: 06/01/21 Potential to Achieve Goals: Fair Progress towards PT goals: Progressing toward goals    Frequency    Min 3X/week      PT Plan Current plan remains appropriate    Co-evaluation              AM-PAC PT "6 Clicks" Mobility   Outcome Measure  Help needed turning from your back to your side while in a flat bed without using bedrails?: A Little Help needed moving from lying on your back to sitting on the side of a flat bed without using bedrails?: A Little Help needed moving to and from a bed to a chair (including a wheelchair)?: A Little Help needed standing up from a chair using your arms (e.g., wheelchair or bedside chair)?: A Little Help needed to walk in hospital room?: A Little Help needed climbing 3-5 steps with a railing? : A Little 6 Click Score: 18    End of Session Equipment Utilized During Treatment: Gait belt Activity Tolerance: Patient tolerated treatment well Patient left: in chair;with call bell/phone within reach;with chair alarm set Nurse Communication: Mobility status PT Visit Diagnosis: Muscle weakness (generalized) (M62.81);Unsteadiness on feet (R26.81);Repeated falls (R29.6)     Time: 6644-0347 PT Time Calculation  (min) (ACUTE ONLY): 28 min  Charges:  $Gait Training: 8-22 mins $Therapeutic Activity: 8-22 mins                     Margie Ege, SPT [place] 05/29/2021, 4:30 PM

## 2021-05-30 ENCOUNTER — Encounter (HOSPITAL_COMMUNITY): Payer: Self-pay | Admitting: Internal Medicine

## 2021-05-30 ENCOUNTER — Inpatient Hospital Stay (HOSPITAL_COMMUNITY): Payer: Commercial Managed Care - HMO

## 2021-05-30 DIAGNOSIS — N179 Acute kidney failure, unspecified: Secondary | ICD-10-CM | POA: Diagnosis not present

## 2021-05-30 DIAGNOSIS — L02416 Cutaneous abscess of left lower limb: Secondary | ICD-10-CM | POA: Diagnosis not present

## 2021-05-30 LAB — CBC WITH DIFFERENTIAL/PLATELET
Abs Immature Granulocytes: 0.46 10*3/uL — ABNORMAL HIGH (ref 0.00–0.07)
Basophils Absolute: 0.1 10*3/uL (ref 0.0–0.1)
Basophils Relative: 1 %
Eosinophils Absolute: 0 10*3/uL (ref 0.0–0.5)
Eosinophils Relative: 0 %
HCT: 26.5 % — ABNORMAL LOW (ref 39.0–52.0)
Hemoglobin: 8.5 g/dL — ABNORMAL LOW (ref 13.0–17.0)
Immature Granulocytes: 6 %
Lymphocytes Relative: 9 %
Lymphs Abs: 0.7 10*3/uL (ref 0.7–4.0)
MCH: 28.9 pg (ref 26.0–34.0)
MCHC: 32.1 g/dL (ref 30.0–36.0)
MCV: 90.1 fL (ref 80.0–100.0)
Monocytes Absolute: 1.7 10*3/uL — ABNORMAL HIGH (ref 0.1–1.0)
Monocytes Relative: 23 %
Neutro Abs: 4.5 10*3/uL (ref 1.7–7.7)
Neutrophils Relative %: 61 %
Platelets: 51 10*3/uL — ABNORMAL LOW (ref 150–400)
RBC: 2.94 MIL/uL — ABNORMAL LOW (ref 4.22–5.81)
RDW: 15.6 % — ABNORMAL HIGH (ref 11.5–15.5)
WBC: 7.3 10*3/uL (ref 4.0–10.5)
nRBC: 1 % — ABNORMAL HIGH (ref 0.0–0.2)

## 2021-05-30 LAB — BASIC METABOLIC PANEL
Anion gap: 5 (ref 5–15)
BUN: 13 mg/dL (ref 8–23)
CO2: 26 mmol/L (ref 22–32)
Calcium: 8.6 mg/dL — ABNORMAL LOW (ref 8.9–10.3)
Chloride: 107 mmol/L (ref 98–111)
Creatinine, Ser: 0.72 mg/dL (ref 0.61–1.24)
GFR, Estimated: >60 mL/min (ref >60)
Glucose, Bld: 102 mg/dL — ABNORMAL HIGH (ref 70–99)
Potassium: 4 mmol/L (ref 3.5–5.1)
Sodium: 138 mmol/L (ref 135–145)

## 2021-05-30 LAB — AEROBIC/ANAEROBIC CULTURE W GRAM STAIN (SURGICAL/DEEP WOUND): Gram Stain: NONE SEEN

## 2021-05-30 MED ORDER — SODIUM CHLORIDE (PF) 0.9 % IJ SOLN
INTRAMUSCULAR | Status: AC
Start: 2021-05-30 — End: 2021-05-30
  Filled 2021-05-30: qty 50

## 2021-05-30 MED ORDER — IOHEXOL 300 MG/ML  SOLN
100.0000 mL | Freq: Once | INTRAMUSCULAR | Status: AC | PRN
  Administered 2021-05-30: 100 mL via INTRAVENOUS

## 2021-05-30 MED ORDER — COLLAGENASE 250 UNIT/GM EX OINT
TOPICAL_OINTMENT | Freq: Every day | CUTANEOUS | Status: DC
  Filled 2021-05-30 (×2): qty 30

## 2021-05-30 NOTE — Progress Notes (Signed)
Occupational Therapy Treatment Patient Details Name: Adam [name] MRN: [id number] DOB: May 12, 1955 Today's Date: 05/30/2021   History of present illness 66 y.o. male with medical history significant of metastatic small cell lung cancer, HTN. Presenting with multiple falls over the last week. Today presented to his CA clinic for regular follow up and with concerns of left knee and left forearm after falls at home. S/P I&D 5/10, repeat I&D 5/18, and port-a-cath removal 5/19.   OT comments  Patient progressing and showed improved tolerance to standing at sink for 8 min and completing multiple grooming tasks with supervision, compared to previous session. Pt also able to demonstrate LE dressing while seated without external assist and just Min guard for safety at trunk when pt leaned close to floor.  Patient remains limited by mild cognitive deficits, generalized weakness, and decreased activity tolerance along with deficits noted below. Pt continues to demonstrate good rehab potential and would benefit from continued skilled OT to increase safety and independence with ADLs and functional transfers to allow pt to return home safely and reduce caregiver burden and fall risk.    Recommendations for follow up therapy are one component of a multi-disciplinary discharge planning process, led by the attending physician.  Recommendations may be updated based on patient status, additional functional criteria and insurance authorization.    Follow Up Recommendations  Home health OT    Assistance Recommended at Discharge Frequent or constant Supervision/Assistance  Patient can return home with the following  A little help with walking and/or transfers;A little help with bathing/dressing/bathroom;Assistance with cooking/housework;Direct supervision/assist for financial management;Direct supervision/assist for medications management;Assist for transportation;Help with stairs or ramp for entrance   Equipment  Recommendations  BSC/3in1    Recommendations for Other Services      Precautions / Restrictions Precautions Precautions: Fall Precaution Comments: multiple falls, LUE and LLE wound, monitor HR Restrictions Weight Bearing Restrictions: No       Mobility Bed Mobility Overal bed mobility: Needs Assistance Bed Mobility: Sit to Supine       Sit to supine: Supervision   General bed mobility comments: Pt able to control trunk and raise BLEs onto bed without external assistance.    Transfers                         Balance Overall balance assessment: Needs assistance Sitting-balance support: Feet supported, No upper extremity supported, Single extremity supported Sitting balance-Leahy Scale: Good     Standing balance support: No upper extremity supported, Single extremity supported, During functional activity Standing balance-Leahy Scale: Fair                             ADL either performed or assessed with clinical judgement   ADL Overall ADL's : Needs assistance/impaired     Grooming: Oral care;Wash/dry face;Wash/dry hands;Standing;Supervision/safety Grooming Details (indicate cue type and reason): Pt stood at sink for 8 min and performed above grooming tasks with close supervision.               Lower Body Dressing Details (indicate cue type and reason): With increased time and effort pt able to use bed rail as sort of step stood and demonstrate doffing and donning each sock with LT more effort than RT and Min guard to blockl trunk as pt leaned down to floor as pt unable to perform figure 4.   Toilet Transfer Details (indicate cue type and reason): Pt  stood from EOB with supervision and used his hurry-cane to ambulate from recliner to sink and sink to back EOB with supervision.   Toileting - Clothing Manipulation Details (indicate cue type and reason): Pt denied need to void.     Functional mobility during ADLs: Supervision/safety;Cane       Extremity/Trunk Assessment Upper Extremity Assessment Upper Extremity Assessment: Overall WFL for tasks assessed            Vision Baseline Vision/History: 0 No visual deficits     Perception     Praxis      Cognition Arousal/Alertness: Awake/alert Behavior During Therapy: Flat affect Overall Cognitive Status: No family/caregiver present to determine baseline cognitive functioning Area of Impairment: Orientation                 Orientation Level: Disoriented to, Situation Current Attention Level: Sustained   Following Commands: Follows one step commands consistently   Awareness: Anticipatory Problem Solving: Slow processing, Requires verbal cues General Comments: Improved ability to follow 2-step instructions.        Exercises      Shoulder Instructions       General Comments      Pertinent Vitals/ Pain       Pain Assessment Pain Assessment: No/denies pain  Home Living                                          Prior Functioning/Environment              Frequency  Min 2X/week        Progress Toward Goals  OT Goals(current goals can now be found in the care plan section)  Progress towards OT goals: Progressing toward goals  Acute Rehab OT Goals Patient Stated Goal: get stronger OT Goal Formulation: With patient Time For Goal Achievement: 06/01/21 Potential to Achieve Goals: Good  Plan Discharge plan remains appropriate    Co-evaluation                 AM-PAC OT "6 Clicks" Daily Activity     Outcome Measure   Help from another person eating meals?: None Help from another person taking care of personal grooming?: A Little Help from another person toileting, which includes using toliet, bedpan, or urinal?: A Little Help from another person bathing (including washing, rinsing, drying)?: A Little Help from another person to put on and taking off regular upper body clothing?: A Little Help from another  person to put on and taking off regular lower body clothing?: A Little 6 Click Score: 19    End of Session Equipment Utilized During Treatment: Other (comment) (Pt's hurry-cane)  OT Visit Diagnosis: Unsteadiness on feet (R26.81)   Activity Tolerance Patient tolerated treatment well   Patient Left in bed;with call bell/phone within reach   Nurse Communication Mobility status        Time: 2641-5830 OT Time Calculation (min): 18 min  Charges: OT General Charges $OT Visit: 1 Visit OT Treatments $Self Care/Home Management : 8-22 mins  [name] Malta, [place] Office: [phone] 05/30/2021  Julien Girt 05/30/2021, 12:45 PM

## 2021-05-30 NOTE — Consult Note (Addendum)
Reason for Consult: Wound eschar Referring Physician: Dr. Burman Freestone is an 66 y.o. male.  HPI: Patient is a 66 year old male with PMH significant for metastatic small cell lung cancer who was sent to the ED from the cancer clinic for evaluation of left knee and left forearm swelling.  Patient was admitted 05/17/2021 for sepsis, AKI, and neutropenia.  Blood cultures grew E. coli.  He underwent bedside I&D in the ER for his multiple abscesses and was started on IV antibiotics empirically.  Orthopedics was involved early and performed I&D of left forearm wound 05/25/2021 at bedside.  ID involved for ongoing antibiotic recommendations.  Hem-Onc managing pancytopenia with Granix and he received PRBC and platelet transfusions during admission.    Patient's hemoglobin, leukopenia, and platelet count has improved.  CT imaging was obtained last night of his left knee and left hip which revealed multiple superficial abscesses in left hip, lateral knee, and proximal lower leg.  There was also a deep abscess seen along the superficial fascia overlying greater trochanter.  Plastic surgery was consulted specifically to address the overlying eschar that may be acting as nidus.    On my examination, patient is resting comfortably in bed.  He has been participating in OT.  Denies any pain or fevers.  Reports that his most recent dressing change was a couple days ago.   Past Medical History:  Diagnosis Date   Hypertension    Lung cancer ([place])    small cell RUL    Past Surgical History:  Procedure Laterality Date   BRONCHIAL BRUSHINGS  02/08/2020   Procedure: BRONCHIAL BRUSHINGS;  Surgeon: Freddi Starr, MD;  Location: [facility] ENDOSCOPY;  Service: Pulmonary;;   BRONCHIAL NEEDLE ASPIRATION BIOPSY  02/08/2020   Procedure: BRONCHIAL NEEDLE ASPIRATION BIOPSIES;  Surgeon: Freddi Starr, MD;  Location: [place];  Service: Pulmonary;;   IR IMAGING GUIDED PORT INSERTION  12/30/2020   IR REMOVAL TUN  ACCESS W/ PORT W/O FL MOD SED  05/26/2021   KNEE SURGERY Right    SHOULDER SURGERY Left    VIDEO BRONCHOSCOPY WITH ENDOBRONCHIAL ULTRASOUND N/A 02/08/2020   Procedure: VIDEO BRONCHOSCOPY WITH ENDOBRONCHIAL ULTRASOUND;  Surgeon: Freddi Starr, MD;  Location: [facility];  Service: Pulmonary;  Laterality: N/A;    Family History  Problem Relation Age of Onset   Heart disease Mother    Cancer Cousin    Breast cancer Neg Hx    Colon cancer Neg Hx    Pancreatic cancer Neg Hx    Prostate cancer Neg Hx     Social History:  reports that he quit smoking about 2 years ago. His smoking use included cigarettes. He has a 20.00 pack-year smoking history. He has never used smokeless tobacco. He reports that he does not currently use alcohol. He reports that he does not use drugs.  Allergies: No Known Allergies  Medications: I have reviewed the patient's current medications.  Results for orders placed or performed during the hospital encounter of 05/17/21 (from the past 48 hour(s))  Basic metabolic panel     Status: Abnormal   Collection Time: 05/29/21  3:56 AM  Result Value Ref Range   Sodium 140 135 - 145 mmol/L   Potassium 4.0 3.5 - 5.1 mmol/L   Chloride 110 98 - 111 mmol/L   CO2 25 22 - 32 mmol/L   Glucose, Bld 91 70 - 99 mg/dL    Comment: Glucose reference range applies only to samples taken after fasting for at  least 8 hours.   BUN 12 8 - 23 mg/dL   Creatinine, Ser 0.63 0.61 - 1.24 mg/dL   Calcium 8.5 (L) 8.9 - 10.3 mg/dL   GFR, Estimated >60 >60 mL/min    Comment: (NOTE) Calculated using the CKD-EPI Creatinine Equation (2021)    Anion gap 5 5 - 15    Comment: Performed at [facility], [place] [address] [place], [place] [address]  CBC with Differential/Platelet     Status: Abnormal   Collection Time: 05/29/21  3:57 AM  Result Value Ref Range   WBC 6.9 4.0 - 10.5 K/uL   RBC 2.97 (L) 4.22 - 5.81 MIL/uL   Hemoglobin 8.4 (L) 13.0 - 17.0 g/dL   HCT 26.6 (L) 39.0  - 52.0 %   MCV 89.6 80.0 - 100.0 fL   MCH 28.3 26.0 - 34.0 pg   MCHC 31.6 30.0 - 36.0 g/dL   RDW 15.5 11.5 - 15.5 %   Platelets 39 (L) 150 - 400 K/uL    Comment: Immature Platelet Fraction may be clinically indicated, consider ordering this additional test [id number] CONSISTENT WITH PREVIOUS RESULT REPEATED TO VERIFY    nRBC 0.7 (H) 0.0 - 0.2 %   Neutrophils Relative % 62 %   Neutro Abs 4.3 1.7 - 7.7 K/uL   Lymphocytes Relative 9 %   Lymphs Abs 0.6 (L) 0.7 - 4.0 K/uL   Monocytes Relative 17 %   Monocytes Absolute 1.2 (H) 0.1 - 1.0 K/uL   Eosinophils Relative 0 %   Eosinophils Absolute 0.0 0.0 - 0.5 K/uL   Basophils Relative 1 %   Basophils Absolute 0.1 0.0 - 0.1 K/uL   WBC Morphology DOHLE BODIES     Comment: MILD LEFT SHIFT (1-5% METAS, OCC MYELO, OCC BANDS)   Immature Granulocytes 11 %   Abs Immature Granulocytes 0.74 (H) 0.00 - 0.07 K/uL   Tear Drop Cells PRESENT    Burr Cells PRESENT    Ovalocytes PRESENT     Comment: Performed at [facility], [place] [address] [place], [place] [address]  CBC with Differential/Platelet     Status: Abnormal   Collection Time: 05/30/21  3:47 AM  Result Value Ref Range   WBC 7.3 4.0 - 10.5 K/uL   RBC 2.94 (L) 4.22 - 5.81 MIL/uL   Hemoglobin 8.5 (L) 13.0 - 17.0 g/dL   HCT 26.5 (L) 39.0 - 52.0 %   MCV 90.1 80.0 - 100.0 fL   MCH 28.9 26.0 - 34.0 pg   MCHC 32.1 30.0 - 36.0 g/dL   RDW 15.6 (H) 11.5 - 15.5 %   Platelets 51 (L) 150 - 400 K/uL    Comment: Immature Platelet Fraction may be clinically indicated, consider ordering this additional test [id number] CONSISTENT WITH PREVIOUS RESULT REPEATED TO VERIFY    nRBC 1.0 (H) 0.0 - 0.2 %   Neutrophils Relative % 61 %   Neutro Abs 4.5 1.7 - 7.7 K/uL   Lymphocytes Relative 9 %   Lymphs Abs 0.7 0.7 - 4.0 K/uL   Monocytes Relative 23 %   Monocytes Absolute 1.7 (H) 0.1 - 1.0 K/uL   Eosinophils Relative 0 %   Eosinophils Absolute 0.0 0.0 - 0.5 K/uL   Basophils Relative 1 %    Basophils Absolute 0.1 0.0 - 0.1 K/uL   WBC Morphology DOHLE BODIES     Comment: MILD LEFT SHIFT (1-5% METAS, OCC MYELO, OCC BANDS) TOXIC GRANULATION    Immature Granulocytes 6 %  Abs Immature Granulocytes 0.46 (H) 0.00 - 0.07 K/uL   Tear Drop Cells PRESENT    Burr Cells PRESENT    Polychromasia PRESENT     Comment: Performed at [facility], [place] [address], [place], [place] [address]  Basic metabolic panel     Status: Abnormal   Collection Time: 05/30/21  3:47 AM  Result Value Ref Range   Sodium 138 135 - 145 mmol/L   Potassium 4.0 3.5 - 5.1 mmol/L   Chloride 107 98 - 111 mmol/L   CO2 26 22 - 32 mmol/L   Glucose, Bld 102 (H) 70 - 99 mg/dL    Comment: Glucose reference range applies only to samples taken after fasting for at least 8 hours.   BUN 13 8 - 23 mg/dL   Creatinine, Ser 0.72 0.61 - 1.24 mg/dL   Calcium 8.6 (L) 8.9 - 10.3 mg/dL   GFR, Estimated >60 >60 mL/min    Comment: (NOTE) Calculated using the CKD-EPI Creatinine Equation (2021)    Anion gap 5 5 - 15    Comment: Performed at [facility], [place] [address], [place], [place] [address]    CT FEMUR LEFT W CONTRAST  Result Date: 05/30/2021 CLINICAL DATA:  Left lateral thigh/knee wound. Left knee soft tissue abscess incision and drainage in the ER 2 weeks ago. History of lung cancer. EXAM: CT OF THE LOWER LEFT EXTREMITY WITH CONTRAST TECHNIQUE: Multidetector CT imaging of the left femur and knee was performed according to the standard protocol following intravenous contrast administration. RADIATION DOSE REDUCTION: This exam was performed according to the departmental dose-optimization program which includes automated exposure control, adjustment of the mA and/or kV according to patient size and/or use of iterative reconstruction technique. CONTRAST:  128mL OMNIPAQUE IOHEXOL 300 MG/ML  SOLN COMPARISON:  Left knee x-rays dated May 17, 2021. CT abdomen pelvis dated April 07, 2021. FINDINGS:  Bones/Joint/Cartilage Lytic lesions in the left pubic rami and ischial tuberosity again noted with subacute pathologic fracture of the left superior pubic ramus and parasymphyseal pubic bone, similar to prior. Mild left hip osteoarthritis. No hip or knee joint effusion. Ligaments Ligaments are suboptimally evaluated by CT. Muscles and Tendons Grossly intact. Soft tissue Superficial 2.8 x 3.9 x 3.8 cm rim enhancing fluid collection in the posterolateral left hip with overlying prominent skin thickening (series 6, image 72). Slightly more superiorly and deep along the superficial fascia, there is a 2.1 x 1.1 x 2.1 cm rim enhancing fluid collection (series 6, image 63). Superficial 5.5 x 1.1 x 4.5 cm rim enhancing fluid collection in the lateral knee draining to the skin surface (series 6, image 331). Superficial 1.7 x 1.0 x 2.5 cm rim enhancing fluid collection in the lateral proximal lower leg (series 6, image 363). Prominent skin thickening overlying both fluid collections. No soft tissue mass. IMPRESSION: 1. Three superficial abscesses in the left hip, lateral knee, and proximal lower leg as described above. 2. Smaller 2.1 cm deep abscess along the superficial fascia overlying the left greater trochanter. 3. Unchanged left hemipelvis osseous metastatic disease with pathologic fracture of the left superior pubic ramus. Electronically Signed   By: Titus Dubin M.D.   On: 05/30/2021 12:30   CT KNEE LEFT W CONTRAST  Result Date: 05/30/2021 CLINICAL DATA:  Left lateral thigh/knee wound. Left knee soft tissue abscess incision and drainage in the ER 2 weeks ago. History of lung cancer. EXAM: CT OF THE LOWER LEFT EXTREMITY WITH CONTRAST TECHNIQUE: Multidetector CT imaging  of the left femur and knee was performed according to the standard protocol following intravenous contrast administration. RADIATION DOSE REDUCTION: This exam was performed according to the departmental dose-optimization program which includes  automated exposure control, adjustment of the mA and/or kV according to patient size and/or use of iterative reconstruction technique. CONTRAST:  188mL OMNIPAQUE IOHEXOL 300 MG/ML  SOLN COMPARISON:  Left knee x-rays dated May 17, 2021. CT abdomen pelvis dated April 07, 2021. FINDINGS: Bones/Joint/Cartilage Lytic lesions in the left pubic rami and ischial tuberosity again noted with subacute pathologic fracture of the left superior pubic ramus and parasymphyseal pubic bone, similar to prior. Mild left hip osteoarthritis. No hip or knee joint effusion. Ligaments Ligaments are suboptimally evaluated by CT. Muscles and Tendons Grossly intact. Soft tissue Superficial 2.8 x 3.9 x 3.8 cm rim enhancing fluid collection in the posterolateral left hip with overlying prominent skin thickening (series 6, image 72). Slightly more superiorly and deep along the superficial fascia, there is a 2.1 x 1.1 x 2.1 cm rim enhancing fluid collection (series 6, image 63). Superficial 5.5 x 1.1 x 4.5 cm rim enhancing fluid collection in the lateral knee draining to the skin surface (series 6, image 331). Superficial 1.7 x 1.0 x 2.5 cm rim enhancing fluid collection in the lateral proximal lower leg (series 6, image 363). Prominent skin thickening overlying both fluid collections. No soft tissue mass. IMPRESSION: 1. Three superficial abscesses in the left hip, lateral knee, and proximal lower leg as described above. 2. Smaller 2.1 cm deep abscess along the superficial fascia overlying the left greater trochanter. 3. Unchanged left hemipelvis osseous metastatic disease with pathologic fracture of the left superior pubic ramus. Electronically Signed   By: Titus Dubin M.D.   On: 05/30/2021 12:30    Review of Systems  Constitutional:  Positive for malaise/fatigue. Negative for fever.  Musculoskeletal:  Negative for joint pain and myalgias.    Blood pressure 113/81, pulse 97, temperature 98.7 F (37.1 C), temperature source Oral, resp.  rate 18, height 5\' 9"  (1.753 m), weight 76.1 kg, SpO2 100 %.   Physical Exam Constitutional:      General: He is not in acute distress.    Appearance: He is not ill-appearing.  Pulmonary:     Effort: Pulmonary effort is normal. No respiratory distress.  Musculoskeletal:     Comments: Left forearm: 5 x 5 centimeter wound over proximal ulnar/volar aspect.  Slough at base.  Approximately 3 x 2 cm area of full-thickness necrosis peripherally.  Additional focal collections approximately 1 x 1 cm around wound, no fluctuance.  No lymphangitis or significant induration.  No significant malodor or drainage.  Radial pulse intact.  Radial, median, and ulnar nerves all intact.  Can flex and extend elbow. Left hip: 6 x 6 cm area of swelling laterally. Mild erythema with small fluctuance centrally.  No significant warmth or induration.  Nontender. Left knee: 5 x 5 cm area of swelling over lateral aspect with 3.5 x 3.5 cm wound centrally.  Wound has full thickness necrosis.  No significant drainage or malodor.  Separate 3 x 2 cm area of swelling inferiorly, suspect mild amount of subcutaneous fluid.    Neurological:     Mental Status: He is alert.  Psychiatric:        Behavior: Behavior normal.        Thought Content: Thought content normal.     Assessment/Plan:  Patient does have areas of necrosis over left forearm and left knee, but his  infection has not fully been treated and he is not a good surgical candidate.  Instead, will recommend non-operative management of his eschar with enzymatic debridement.  Will place new wound care orders to incorporate Santyl dressing changes.  Discussed case with Dr. Claudia Desanctis who agrees with assessment and plan. Recommend referral to wound care center for continued outpatient wound care upon discharge.  Daily dressing changes to left forearm and left knee wounds: -Cleanse wound with sterile saline.  -Apply Santyl to wound/eschar followed by non-adherent pad and 4 x 4  gauze.  -Secure with Kerlix and Ace wrap.    Krista Blue 05/30/2021, 4:17 PM

## 2021-05-30 NOTE — Consult Note (Signed)
Adam [name] 1955-06-28  [id number].    Requesting MD: Dr. Niel Hummer Chief Complaint/Reason for Consult: left hip abscess  HPI:  This is a 66 yo male with a history of lung cancer and HTN who presents to the hospital secondary to multiple wounds as well as e coli bacteremia.  He has had ortho following along for debridement of his elbow and knee wounds.  He has a left hip wound that we have been asked to see today.  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    Past Surgical History:  Procedure Laterality Date   BRONCHIAL BRUSHINGS  02/08/2020   Procedure: BRONCHIAL BRUSHINGS;  Surgeon: Freddi Starr, MD;  Location: [facility] ENDOSCOPY;  Service: Pulmonary;;   BRONCHIAL NEEDLE ASPIRATION BIOPSY  02/08/2020   Procedure: BRONCHIAL NEEDLE ASPIRATION BIOPSIES;  Surgeon: Freddi Starr, MD;  Location: [facility];  Service: Pulmonary;;   IR IMAGING GUIDED PORT INSERTION  12/30/2020   IR REMOVAL TUN ACCESS W/ PORT W/O FL MOD SED  05/26/2021   KNEE SURGERY Right    SHOULDER SURGERY Left    VIDEO BRONCHOSCOPY WITH ENDOBRONCHIAL ULTRASOUND N/A 02/08/2020   Procedure: VIDEO BRONCHOSCOPY WITH ENDOBRONCHIAL ULTRASOUND;  Surgeon: Freddi Starr, MD;  Location: [facility] ENDOSCOPY;  Service: Pulmonary;  Laterality: N/A;    Social History:  reports that he quit smoking about 2 years ago. His smoking use included cigarettes. He has a 20.00 pack-year smoking history. He has never used smokeless tobacco. He reports that he does not currently use alcohol. He reports that he does not use drugs.  Allergies: No Known Allergies  Medications Prior to Admission  Medication Sig Dispense Refill   acetaminophen (TYLENOL) 650  MG CR tablet Take 650 mg by mouth every 8 (eight) hours as needed for pain.     amLODipine (NORVASC) 10 MG tablet Take 10 mg by mouth daily.     aspirin 81 MG chewable tablet Chew 81 mg by mouth daily.     bisacodyl (DULCOLAX) 5 MG EC tablet Take 5 mg by mouth daily as needed for moderate constipation.     lidocaine-prilocaine (EMLA) cream Apply 1 application topically as needed. (Patient taking differently: Apply 1 application. topically as needed (to access port).) 30 g 2   lisinopril-hydrochlorothiazide (ZESTORETIC) 20-12.5 MG tablet Take 1 tablet by mouth daily. 90 tablet 1   methylPREDNISolone (MEDROL DOSEPAK) 4 MG TBPK tablet Use as instructed (Patient not taking: Reported on 05/17/2021) 21 tablet 0   mirtazapine (REMERON) 15 MG tablet Take 2 tablets (30 mg total) by mouth at bedtime. (Patient not taking: Reported on 05/17/2021) 30 tablet 2   nystatin (MYCOSTATIN) 100000 UNIT/ML suspension Take 5 mLs (500,000 Units total) by mouth 4 (four) times daily. (Patient not taking: Reported on 05/17/2021) 200 mL 0   potassium chloride SA (KLOR-CON M) 20 MEQ tablet Take 1 tablet (20 mEq total) by mouth 2 (two) times daily. (Patient not taking: Reported on 05/17/2021) 14 tablet 0     Physical Exam: Blood pressure 113/81, pulse 97, temperature 98.7 F (37.1 C), temperature source  Oral, resp. rate 18, height 5\' 9"  (1.753 m), weight 76.1 kg, SpO2 100 %. General: pleasant male who is laying in bed in NAD HEENT: head is normocephalic, atraumatic.  Sclera are noninjected.  PERRL.  Ears and nose without any masses or lesions.  Mouth is pink and moist Heart: regular, rate, and rhythm.  Normal s1,s2. No obvious murmurs, gallops, or rubs noted.   Lungs/chest:  Respiratory effort nonlabored, PAC site covered with bandage and dry Abd: soft, NT, ND, +BS, no masses, hernias Skin: warm and dry but with a left hip area that is approximately 3x4cm in size.  This feels well circumscribed like it can be "picked up" like a  nodule or cyst.  It is not erythematous or warm.  It was cleaned with chlorhexidine and a 21G needle was used to aspirate this area.  Only 1 cc of purulent drainage was removed.  This did not change the appearance of the wound from the outside Psych: A&Ox3 but with a flat affect and very slowed speech   Results for orders placed or performed during the hospital encounter of 05/17/21 (from the past 48 hour(s))  Basic metabolic panel     Status: Abnormal   Collection Time: 05/29/21  3:56 AM  Result Value Ref Range   Sodium 140 135 - 145 mmol/L   Potassium 4.0 3.5 - 5.1 mmol/L   Chloride 110 98 - 111 mmol/L   CO2 25 22 - 32 mmol/L   Glucose, Bld 91 70 - 99 mg/dL    Comment: Glucose reference range applies only to samples taken after fasting for at least 8 hours.   BUN 12 8 - 23 mg/dL   Creatinine, Ser 0.63 0.61 - 1.24 mg/dL   Calcium 8.5 (L) 8.9 - 10.3 mg/dL   GFR, Estimated >60 >60 mL/min    Comment: (NOTE) Calculated using the CKD-EPI Creatinine Equation (2021)    Anion gap 5 5 - 15    Comment: Performed at [facility], [place] [address] [place], [place] [address]  CBC with Differential/Platelet     Status: Abnormal   Collection Time: 05/29/21  3:57 AM  Result Value Ref Range   WBC 6.9 4.0 - 10.5 K/uL   RBC 2.97 (L) 4.22 - 5.81 MIL/uL   Hemoglobin 8.4 (L) 13.0 - 17.0 g/dL   HCT 26.6 (L) 39.0 - 52.0 %   MCV 89.6 80.0 - 100.0 fL   MCH 28.3 26.0 - 34.0 pg   MCHC 31.6 30.0 - 36.0 g/dL   RDW 15.5 11.5 - 15.5 %   Platelets 39 (L) 150 - 400 K/uL    Comment: Immature Platelet Fraction may be clinically indicated, consider ordering this additional test [id number] CONSISTENT WITH PREVIOUS RESULT REPEATED TO VERIFY    nRBC 0.7 (H) 0.0 - 0.2 %   Neutrophils Relative % 62 %   Neutro Abs 4.3 1.7 - 7.7 K/uL   Lymphocytes Relative 9 %   Lymphs Abs 0.6 (L) 0.7 - 4.0 K/uL   Monocytes Relative 17 %   Monocytes Absolute 1.2 (H) 0.1 - 1.0 K/uL   Eosinophils Relative 0 %    Eosinophils Absolute 0.0 0.0 - 0.5 K/uL   Basophils Relative 1 %   Basophils Absolute 0.1 0.0 - 0.1 K/uL   WBC Morphology DOHLE BODIES     Comment: MILD LEFT SHIFT (1-5% METAS, OCC MYELO, OCC BANDS)   Immature Granulocytes 11 %   Abs Immature Granulocytes 0.74 (H) 0.00 - 0.07 K/uL  Tear Drop Cells PRESENT    Burr Cells PRESENT    Ovalocytes PRESENT     Comment: Performed at [facility], [place] [address], [place], [place] [address]  CBC with Differential/Platelet     Status: Abnormal   Collection Time: 05/30/21  3:47 AM  Result Value Ref Range   WBC 7.3 4.0 - 10.5 K/uL   RBC 2.94 (L) 4.22 - 5.81 MIL/uL   Hemoglobin 8.5 (L) 13.0 - 17.0 g/dL   HCT 26.5 (L) 39.0 - 52.0 %   MCV 90.1 80.0 - 100.0 fL   MCH 28.9 26.0 - 34.0 pg   MCHC 32.1 30.0 - 36.0 g/dL   RDW 15.6 (H) 11.5 - 15.5 %   Platelets 51 (L) 150 - 400 K/uL    Comment: Immature Platelet Fraction may be clinically indicated, consider ordering this additional test [id number] CONSISTENT WITH PREVIOUS RESULT REPEATED TO VERIFY    nRBC 1.0 (H) 0.0 - 0.2 %   Neutrophils Relative % 61 %   Neutro Abs 4.5 1.7 - 7.7 K/uL   Lymphocytes Relative 9 %   Lymphs Abs 0.7 0.7 - 4.0 K/uL   Monocytes Relative 23 %   Monocytes Absolute 1.7 (H) 0.1 - 1.0 K/uL   Eosinophils Relative 0 %   Eosinophils Absolute 0.0 0.0 - 0.5 K/uL   Basophils Relative 1 %   Basophils Absolute 0.1 0.0 - 0.1 K/uL   WBC Morphology DOHLE BODIES     Comment: MILD LEFT SHIFT (1-5% METAS, OCC MYELO, OCC BANDS) TOXIC GRANULATION    Immature Granulocytes 6 %   Abs Immature Granulocytes 0.46 (H) 0.00 - 0.07 K/uL   Tear Drop Cells PRESENT    Burr Cells PRESENT    Polychromasia PRESENT     Comment: Performed at [facility], [place] [address], [place], [place] [address]  Basic metabolic panel     Status: Abnormal   Collection Time: 05/30/21  3:47 AM  Result Value Ref Range   Sodium 138 135 - 145 mmol/L   Potassium 4.0 3.5 - 5.1  mmol/L   Chloride 107 98 - 111 mmol/L   CO2 26 22 - 32 mmol/L   Glucose, Bld 102 (H) 70 - 99 mg/dL    Comment: Glucose reference range applies only to samples taken after fasting for at least 8 hours.   BUN 13 8 - 23 mg/dL   Creatinine, Ser 0.72 0.61 - 1.24 mg/dL   Calcium 8.6 (L) 8.9 - 10.3 mg/dL   GFR, Estimated >60 >60 mL/min    Comment: (NOTE) Calculated using the CKD-EPI Creatinine Equation (2021)    Anion gap 5 5 - 15    Comment: Performed at [facility], [place] [address] [place], [place] [address]   CT FEMUR LEFT W CONTRAST  Result Date: 05/30/2021 CLINICAL DATA:  Left lateral thigh/knee wound. Left knee soft tissue abscess incision and drainage in the ER 2 weeks ago. History of lung cancer. EXAM: CT OF THE LOWER LEFT EXTREMITY WITH CONTRAST TECHNIQUE: Multidetector CT imaging of the left femur and knee was performed according to the standard protocol following intravenous contrast administration. RADIATION DOSE REDUCTION: This exam was performed according to the departmental dose-optimization program which includes automated exposure control, adjustment of the mA and/or kV according to patient size and/or use of iterative reconstruction technique. CONTRAST:  183mL OMNIPAQUE IOHEXOL 300 MG/ML  SOLN COMPARISON:  Left knee x-rays dated May 17, 2021. CT abdomen pelvis dated April 07, 2021.  FINDINGS: Bones/Joint/Cartilage Lytic lesions in the left pubic rami and ischial tuberosity again noted with subacute pathologic fracture of the left superior pubic ramus and parasymphyseal pubic bone, similar to prior. Mild left hip osteoarthritis. No hip or knee joint effusion. Ligaments Ligaments are suboptimally evaluated by CT. Muscles and Tendons Grossly intact. Soft tissue Superficial 2.8 x 3.9 x 3.8 cm rim enhancing fluid collection in the posterolateral left hip with overlying prominent skin thickening (series 6, image 72). Slightly more superiorly and deep along the superficial  fascia, there is a 2.1 x 1.1 x 2.1 cm rim enhancing fluid collection (series 6, image 63). Superficial 5.5 x 1.1 x 4.5 cm rim enhancing fluid collection in the lateral knee draining to the skin surface (series 6, image 331). Superficial 1.7 x 1.0 x 2.5 cm rim enhancing fluid collection in the lateral proximal lower leg (series 6, image 363). Prominent skin thickening overlying both fluid collections. No soft tissue mass. IMPRESSION: 1. Three superficial abscesses in the left hip, lateral knee, and proximal lower leg as described above. 2. Smaller 2.1 cm deep abscess along the superficial fascia overlying the left greater trochanter. 3. Unchanged left hemipelvis osseous metastatic disease with pathologic fracture of the left superior pubic ramus. Electronically Signed   By: Titus Dubin M.D.   On: 05/30/2021 12:30   CT KNEE LEFT W CONTRAST  Result Date: 05/30/2021 CLINICAL DATA:  Left lateral thigh/knee wound. Left knee soft tissue abscess incision and drainage in the ER 2 weeks ago. History of lung cancer. EXAM: CT OF THE LOWER LEFT EXTREMITY WITH CONTRAST TECHNIQUE: Multidetector CT imaging of the left femur and knee was performed according to the standard protocol following intravenous contrast administration. RADIATION DOSE REDUCTION: This exam was performed according to the departmental dose-optimization program which includes automated exposure control, adjustment of the mA and/or kV according to patient size and/or use of iterative reconstruction technique. CONTRAST:  129mL OMNIPAQUE IOHEXOL 300 MG/ML  SOLN COMPARISON:  Left knee x-rays dated May 17, 2021. CT abdomen pelvis dated April 07, 2021. FINDINGS: Bones/Joint/Cartilage Lytic lesions in the left pubic rami and ischial tuberosity again noted with subacute pathologic fracture of the left superior pubic ramus and parasymphyseal pubic bone, similar to prior. Mild left hip osteoarthritis. No hip or knee joint effusion. Ligaments Ligaments are  suboptimally evaluated by CT. Muscles and Tendons Grossly intact. Soft tissue Superficial 2.8 x 3.9 x 3.8 cm rim enhancing fluid collection in the posterolateral left hip with overlying prominent skin thickening (series 6, image 72). Slightly more superiorly and deep along the superficial fascia, there is a 2.1 x 1.1 x 2.1 cm rim enhancing fluid collection (series 6, image 63). Superficial 5.5 x 1.1 x 4.5 cm rim enhancing fluid collection in the lateral knee draining to the skin surface (series 6, image 331). Superficial 1.7 x 1.0 x 2.5 cm rim enhancing fluid collection in the lateral proximal lower leg (series 6, image 363). Prominent skin thickening overlying both fluid collections. No soft tissue mass. IMPRESSION: 1. Three superficial abscesses in the left hip, lateral knee, and proximal lower leg as described above. 2. Smaller 2.1 cm deep abscess along the superficial fascia overlying the left greater trochanter. 3. Unchanged left hemipelvis osseous metastatic disease with pathologic fracture of the left superior pubic ramus. Electronically Signed   By: Titus Dubin M.D.   On: 05/30/2021 12:30      Assessment/Plan L lateral thigh lesion/abscess The patient has been seen and examined, chart and imaging reviewed.  This  area on his hip was aspirated with a small amount of purulent drainage noted.  This does not look like a typical abscess with erythema or warmth.  Unclear whether this is cystic in nature vs some type of necrotic nodule with some overlying infection.  I discussed with him that we would recommend surgery to go in and take care of this area and not at the bedside given his platelet count and size of this.  The patient states "You will need to check back with me in the morning to see how I feel about that."  I will make him NPO p MN so if he is agreeable for drainage of this tomorrow, we can try to do that schedule depending.  FEN - NPO p MN VTE - on hold due to pancytopenia ID - per  ID  E. Coli bacteremia Multiple nonhealing wounds HTN Lung cancer  I reviewed Consultant ID, ortho notes, hospitalist notes, last 24 h vitals and pain scores, last 48 h intake and output, last 24 h labs and trends, and last 24 h imaging results.  Henreitta Cea, [facility] Surgery 05/30/2021, 3:43 PM Please see Amion for pager number during day hours 7:00am-4:30pm or 7:00am -11:30am on weekends

## 2021-05-30 NOTE — Progress Notes (Signed)
     Adam [name] is a 66 y.o. male   66 year old male with past medical history significant for metastatic small cell lung cancer who has had several falls recently.  He presented to emergency department with wounds on the lateral aspect of his left knee and ulnar aspect of his left forearm.  He underwent I&D in the emergency department.  Subsequently general surgery was consulted but deferred to orthopedics due to location of the wounds being near her joints.  Orthopedics was consulted.  We have performed superficial debridement of the left forearm wound and left lateral leg wound.  Plastic surgery was consulted and deferred any formal surgery for his full-thickness skin loss.  Their recommendation was for continued dressing changes and wound care which seems appropriate.  Patient has developed a 8 to 9 cm superficial area of induration on the lateral mid thigh region.  Orthopedics was reconsulted.  Subjective: On my evaluation patient denies significant pain.  He states the lateral thigh indurated area is painful to palpation but not at rest.  They have been doing dressing changes.  Objectyive: Vitals:   05/30/21 0618 05/30/21 1302  BP: 134/82 113/81  Pulse: 90 97  Resp: 20 18  Temp: 98.3 F (36.8 C) 98.7 F (37.1 C)  SpO2: 98% 100%     Exam: Awake and alert Respirations even and unlabored No acute distress  Dressing was removed from left upper extremity wound.  There is soft tissue necrosis with some fibrinous type tissue present.  The area also has some smaller areas of full-thickness skin loss on the more dorsal surface of the forearm.  He is able to range his elbow without difficulty.  There is no obvious areas of fluctuance.  No significant amount of drainage to palpation.  I am not able to express any material.  Dressing was removed from the left lateral leg wound.  Again there is area of skin necrosis with overlying eschar.  No obvious joint involvement.  He is able to range  his knee joint without pain.  No expressible material.  Small amount of packing present.  Left lateral proximal thigh demonstrates approximately 8 cm superficial area of induration with no significant overlying erythema.  The area is not significantly fluctuant.  It is mildly tender to deep palpation.  Does not appear to have any streaking or tracking element proximally or distally.  Assessment and plan:  Patient has a very difficult problem.  At this point I do not feel comfortable with any formal debridement of his wounds or incision and drainage procedures given that he has had wound breakdown and full-thickness skin necrosis previously with similar procedures.  Would defer any debridement of the wound or coverage options to plastic surgery.  Currently the left lateral thigh area of induration seems contained.  It is minimally symptomatic.  May consider reconsulting general surgery for the left lateral thigh wound.  May also consider consulting dermatology for his conditions or transfer to a tertiary referral center given the likely need for repeat and continued care for these wounds..  Could also consider discussing his case with interventional radiology if it is deemed necessary to address the lateral thigh.  None of his wounds appear to involve bone or joint.  There is no plan for any orthopedic intervention at this point.  Would recommend wound care consult while in the hospital and possibly upon discharge.   Radene Journey, MD

## 2021-05-30 NOTE — Progress Notes (Signed)
PROGRESS NOTE    Adam [name]  YKZ:[id number] DOB: 1955-12-11 DOA: 05/17/2021 PCP: de [place], [name], MD   Brief Narrative: 66 year old with past medical history significant for metastatic small cell lung cancer, hypertension presented with multiple falls over the last week.  He presented to the cancer center clinic for follow-up and was having swelling in his left knee and left forearm near his elbow.  He was referred to the ER for further evaluation.  He underwent bedside debridement in the ER of his forearm and knee and swelling with packing placed afterwards.  He was started on IV antibiotics.  He was found to be bacteremic with blood cultures growing E. coli.  Orthopedic surgery was consulted on admission for further evaluation of his underlying abscess.  Orthopedic consulted and recommended continued daily wound care and IV antibiotics.  ID has been consulted for recommendation of IV antibiotics.    Re-evaluated by Ortho 5/18, underwent incision and drainage of left proximal forearm wound by Ortho 5/18 tissue sample was sent for culture. Grew E coli.   Evaluated by ID recommend further debridement by Orhto. Ortho recommended CT thigh and Knee which showed: Three superficial abscesses in the left hip, lateral knee, and proximal lower leg as described above. Smaller 2.1 cm deep abscess along the superficial fascia overlying the left greater trochanter. Ortho recommend : plastic Sx re-consultation, General Sx consultation vs IR>    Assessment & Plan:   Active Problems:   Cellulitis of left forearm   Cellulitis of left knee   E coli bacteremia   Severe neutropenia (HCC)   Primary cancer of right upper lobe of lung (HCC)   Thrombocytopenia (HCC)   HTN (hypertension)   Ecthyma gangrenosum   Gram negative septicemia (HCC)  1-Sepsis secondary to left forearm cellulitis and lower extremity cellulitis and abscess: Sepsis  physiology resolved. Patient presented with tachycardia, tachypnea,  neutropenia, sepsis, infectious process.  Source of infection skin abscess and bacteremia.  2-Severe neutropenia: Secondary to  recent chemotherapy and Sepsis.  Oncology following Treated  with Granix. Last dose 5/20.Marland Kitchen ANC 3.2   3-E. coli bacteremia: Blood cultures positive on admission with E. Coli Repeated blood cultures 5/12 negative Continue with  Ancef. Port A cath removed by IR 5/19. Needs line holiday for 2-3 days.   4-Cellulitis and Abscess skin near  left knee and left forearm, Left Thigh -He had I and D  performed in the ED at bedside 5/10.Marland Kitchen  Packing placed as well. -ID consulted for recommendation for antibiotics. -Underwent incision and drainage of the left forearm wound 5/18 at bedside by ortho.  -Culture from wound growing E coli.  -Plastic consulted per Ortho recommendation on 5/18.  Plastic Surgery recommend continuation wound care.  -ID recommended transition to Cefadroxil. Re-consult Ortho for further I and D on 5/22.  -New area induration noticed left thigh. Continue with antibiotics.  -CT left femur and Knee: Three superficial abscesses in the left hip, lateral knee, and proximal lower leg as described above. Smaller 2.1 cm deep abscess along the superficial fascia overlying the left greater trochanter. Unchanged left hemipelvis osseous metastatic disease with pathologic fracture of the left superior pubic ramus.  -Dr Lucia Gaskins Recommended general Sx consultation, plastic surgery consultation.  General Sx will evaluate patient 5/23. I send message to Plastic Surgery Donna Christen green through [employer].    5- Anemia // Thrombocytopenia: Due to recent chemotherapy and underlying malignancy Received 3 packet of platelets infusion. Received one unit platelet 5/18. Platelet today  at 46 recovering . Monitor.  Received one unit PRBC 5/18. Oncology recommended blood transfusion for hemoglobin less than 8 on platelet transfusion for platelets less than 20 Monitor. Improving.    AKI: Creatinine baseline 0.8.  Present with a creatinine of 1.5 Receive IV fluids.  Renal function improved.  Resolved  Hypomagnesemia: Replaced.     Estimated body mass index is 24.78 kg/m as calculated from the following:   Height as of this encounter: 5\' 9"  (1.753 m).   Weight as of this encounter: 76.1 kg.   DVT prophylaxis: SCD Code Status: Full code Family Communication: care discussed with patient.  Disposition Plan:  Status is: Inpatient Remains inpatient appropriate because: needs IV antibiotics, needs further evaluation by ortho for abscess. Neutropenia.     Consultants:  ID ortho  Procedures:    Antimicrobials:  Ancef  Subjective: He denies any worsening pain.    Objective: Vitals:   05/29/21 1315 05/29/21 2106 05/30/21 0618 05/30/21 1302  BP: 117/79 130/82 134/82 113/81  Pulse: 98 98 90 97  Resp: 17 18 20 18   Temp: 97.9 F (36.6 C) 98.6 F (37 C) 98.3 F (36.8 C) 98.7 F (37.1 C)  TempSrc: Oral Oral  Oral  SpO2: 97% 96% 98% 100%  Weight:      Height:        Intake/Output Summary (Last 24 hours) at 05/30/2021 1349 Last data filed at 05/30/2021 1300 Gross per 24 hour  Intake 918 ml  Output 1725 ml  Net -807 ml    Filed Weights   05/20/21 1101 05/21/21 0608 05/28/21 0542  Weight: 75.6 kg 73.5 kg 76.1 kg    Examination:  General exam: NAD, Flat affect Respiratory system: CTA Cardiovascular system: S 1, S 2 RRR Gastrointestinal system: BS present, soft, nt Central nervous system: Alert, follows command Extremities: no edema Skin: multiples skin abscess, open wound  forearm, Left LE with open wound Left thigh area with induration.   Data Reviewed: I have personally reviewed following labs and imaging studies  CBC: Recent Labs  Lab 05/26/21 0454 05/27/21 0428 05/28/21 0325 05/29/21 0357 05/30/21 0347  WBC 2.0* 3.2* 4.8 6.9 7.3  NEUTROABS 1.3* 2.2 3.2 4.3 4.5  HGB 8.3* 8.9* 8.6* 8.4* 8.5*  HCT 25.6* 26.9* 26.8* 26.6*  26.5*  MCV 88.0 88.8 89.0 89.6 90.1  PLT 43* 35* 34* 39* 51*    Basic Metabolic Panel: Recent Labs  Lab 05/24/21 0740 05/25/21 0800 05/26/21 0454 05/27/21 0428 05/28/21 0325 05/29/21 0356 05/30/21 0347  NA 143 142 141 141 142 140 138  K 3.7 3.6 3.7 3.6 3.7 4.0 4.0  CL 111 111 109 110 110 110 107  CO2 25 26 27 26 27 25 26   GLUCOSE 112* 124* 99 104* 104* 91 102*  BUN 13 15 14 14 14 12 13   CREATININE 0.64 0.75 0.77 0.74 0.73 0.63 0.72  CALCIUM 8.3* 8.3* 8.5* 8.6* 8.6* 8.5* 8.6*  MG 1.8 1.8  --   --  1.8  --   --     GFR: Estimated Creatinine Clearance: 93.3 mL/min (by C-G formula based on SCr of 0.72 mg/dL). Liver Function Tests: No results for input(s): AST, ALT, ALKPHOS, BILITOT, PROT, ALBUMIN in the last 168 hours.  No results for input(s): LIPASE, AMYLASE in the last 168 hours. No results for input(s): AMMONIA in the last 168 hours. Coagulation Profile: No results for input(s): INR, PROTIME in the last 168 hours.  Cardiac Enzymes: No results for input(s): CKTOTAL, CKMB, CKMBINDEX,  TROPONINI in the last 168 hours. BNP (last 3 results) No results for input(s): PROBNP in the last 8760 hours. HbA1C: No results for input(s): HGBA1C in the last 72 hours. CBG: No results for input(s): GLUCAP in the last 168 hours. Lipid Profile: No results for input(s): CHOL, HDL, LDLCALC, TRIG, CHOLHDL, LDLDIRECT in the last 72 hours. Thyroid Function Tests: No results for input(s): TSH, T4TOTAL, FREET4, T3FREE, THYROIDAB in the last 72 hours. Anemia Panel: No results for input(s): VITAMINB12, FOLATE, FERRITIN, TIBC, IRON, RETICCTPCT in the last 72 hours. Sepsis Labs: No results for input(s): PROCALCITON, LATICACIDVEN in the last 168 hours.   Recent Results (from the past 240 hour(s))  Aerobic/Anaerobic Culture w Gram Stain (surgical/deep wound)     Status: None (Preliminary result)   Collection Time: 05/25/21 12:02 PM   Specimen: Tissue  Result Value Ref Range Status   Specimen  Description   Final    TISSUE UNDERNEATH LT ELBOW Performed at [facility] [address], [place], [place] [address]    Special Requests   Final    NONE Performed at [facility], [place] [address], [place], [place] [address]    Gram Stain NO WBC SEEN NO ORGANISMS SEEN   Final   Culture   Final    MODERATE ESCHERICHIA COLI NO ANAEROBES ISOLATED; CULTURE IN PROGRESS FOR 5 DAYS CRITICAL RESULT CALLED TO, READ BACK BY AND VERIFIED WITH: RN CELIA CALLENDER [phone] FCP Performed at [facility], [place] [address], [place], [place] [address]    Report Status PENDING  Incomplete   Organism ID, Bacteria ESCHERICHIA COLI  Final      Susceptibility   Escherichia coli - MIC*    AMPICILLIN <=2 SENSITIVE Sensitive     CEFAZOLIN <=4 SENSITIVE Sensitive     CEFEPIME <=0.12 SENSITIVE Sensitive     CEFTAZIDIME <=1 SENSITIVE Sensitive     CEFTRIAXONE <=0.25 SENSITIVE Sensitive     CIPROFLOXACIN <=0.25 SENSITIVE Sensitive     GENTAMICIN <=1 SENSITIVE Sensitive     IMIPENEM <=0.25 SENSITIVE Sensitive     TRIMETH/SULFA <=20 SENSITIVE Sensitive     AMPICILLIN/SULBACTAM <=2 SENSITIVE Sensitive     PIP/TAZO <=4 SENSITIVE Sensitive     * MODERATE ESCHERICHIA COLI  Aerobic Culture w Gram Stain (superficial specimen)     Status: None   Collection Time: 05/25/21 12:05 PM   Specimen: Abscess  Result Value Ref Range Status   Specimen Description   Final    ABSCESS LT LAT FOREARM Performed at [facility] [address] [place], [place] [address]    Special Requests   Final    NONE Performed at [facility], [place] [address], [place], [place] [address]    Gram Stain   Final    NO WBC SEEN RARE GRAM NEGATIVE RODS Performed at [facility], [place] [address] [place], [place] [address]    Culture RARE ESCHERICHIA COLI  Final   Report Status 05/27/2021 FINAL  Final   Organism ID, Bacteria ESCHERICHIA COLI  Final       Susceptibility   Escherichia coli - MIC*    AMPICILLIN <=2 SENSITIVE Sensitive     CEFAZOLIN <=4 SENSITIVE Sensitive     CEFEPIME <=0.12 SENSITIVE Sensitive     CEFTAZIDIME <=1 SENSITIVE Sensitive     CEFTRIAXONE <=0.25 SENSITIVE Sensitive     CIPROFLOXACIN <=0.25 SENSITIVE Sensitive     GENTAMICIN <=1 SENSITIVE Sensitive  IMIPENEM <=0.25 SENSITIVE Sensitive     TRIMETH/SULFA <=20 SENSITIVE Sensitive     AMPICILLIN/SULBACTAM <=2 SENSITIVE Sensitive     PIP/TAZO <=4 SENSITIVE Sensitive     * RARE ESCHERICHIA COLI          Radiology Studies: CT FEMUR LEFT W CONTRAST  Result Date: 05/30/2021 CLINICAL DATA:  Left lateral thigh/knee wound. Left knee soft tissue abscess incision and drainage in the ER 2 weeks ago. History of lung cancer. EXAM: CT OF THE LOWER LEFT EXTREMITY WITH CONTRAST TECHNIQUE: Multidetector CT imaging of the left femur and knee was performed according to the standard protocol following intravenous contrast administration. RADIATION DOSE REDUCTION: This exam was performed according to the departmental dose-optimization program which includes automated exposure control, adjustment of the mA and/or kV according to patient size and/or use of iterative reconstruction technique. CONTRAST:  133mL OMNIPAQUE IOHEXOL 300 MG/ML  SOLN COMPARISON:  Left knee x-rays dated May 17, 2021. CT abdomen pelvis dated April 07, 2021. FINDINGS: Bones/Joint/Cartilage Lytic lesions in the left pubic rami and ischial tuberosity again noted with subacute pathologic fracture of the left superior pubic ramus and parasymphyseal pubic bone, similar to prior. Mild left hip osteoarthritis. No hip or knee joint effusion. Ligaments Ligaments are suboptimally evaluated by CT. Muscles and Tendons Grossly intact. Soft tissue Superficial 2.8 x 3.9 x 3.8 cm rim enhancing fluid collection in the posterolateral left hip with overlying prominent skin thickening (series 6, image 72). Slightly more superiorly and  deep along the superficial fascia, there is a 2.1 x 1.1 x 2.1 cm rim enhancing fluid collection (series 6, image 63). Superficial 5.5 x 1.1 x 4.5 cm rim enhancing fluid collection in the lateral knee draining to the skin surface (series 6, image 331). Superficial 1.7 x 1.0 x 2.5 cm rim enhancing fluid collection in the lateral proximal lower leg (series 6, image 363). Prominent skin thickening overlying both fluid collections. No soft tissue mass. IMPRESSION: 1. Three superficial abscesses in the left hip, lateral knee, and proximal lower leg as described above. 2. Smaller 2.1 cm deep abscess along the superficial fascia overlying the left greater trochanter. 3. Unchanged left hemipelvis osseous metastatic disease with pathologic fracture of the left superior pubic ramus. Electronically Signed   By: Titus Dubin M.D.   On: 05/30/2021 12:30   CT KNEE LEFT W CONTRAST  Result Date: 05/30/2021 CLINICAL DATA:  Left lateral thigh/knee wound. Left knee soft tissue abscess incision and drainage in the ER 2 weeks ago. History of lung cancer. EXAM: CT OF THE LOWER LEFT EXTREMITY WITH CONTRAST TECHNIQUE: Multidetector CT imaging of the left femur and knee was performed according to the standard protocol following intravenous contrast administration. RADIATION DOSE REDUCTION: This exam was performed according to the departmental dose-optimization program which includes automated exposure control, adjustment of the mA and/or kV according to patient size and/or use of iterative reconstruction technique. CONTRAST:  171mL OMNIPAQUE IOHEXOL 300 MG/ML  SOLN COMPARISON:  Left knee x-rays dated May 17, 2021. CT abdomen pelvis dated April 07, 2021. FINDINGS: Bones/Joint/Cartilage Lytic lesions in the left pubic rami and ischial tuberosity again noted with subacute pathologic fracture of the left superior pubic ramus and parasymphyseal pubic bone, similar to prior. Mild left hip osteoarthritis. No hip or knee joint effusion.  Ligaments Ligaments are suboptimally evaluated by CT. Muscles and Tendons Grossly intact. Soft tissue Superficial 2.8 x 3.9 x 3.8 cm rim enhancing fluid collection in the posterolateral left hip with overlying prominent skin thickening (series 6,  image 72). Slightly more superiorly and deep along the superficial fascia, there is a 2.1 x 1.1 x 2.1 cm rim enhancing fluid collection (series 6, image 63). Superficial 5.5 x 1.1 x 4.5 cm rim enhancing fluid collection in the lateral knee draining to the skin surface (series 6, image 331). Superficial 1.7 x 1.0 x 2.5 cm rim enhancing fluid collection in the lateral proximal lower leg (series 6, image 363). Prominent skin thickening overlying both fluid collections. No soft tissue mass. IMPRESSION: 1. Three superficial abscesses in the left hip, lateral knee, and proximal lower leg as described above. 2. Smaller 2.1 cm deep abscess along the superficial fascia overlying the left greater trochanter. 3. Unchanged left hemipelvis osseous metastatic disease with pathologic fracture of the left superior pubic ramus. Electronically Signed   By: Titus Dubin M.D.   On: 05/30/2021 12:30        Scheduled Meds:  sodium chloride   Intravenous Once   sodium chloride   Intravenous Once   cefadroxil  1,000 mg Oral BID   Chlorhexidine Gluconate Cloth  6 each Topical Daily   feeding supplement  237 mL Oral TID BM   sodium chloride (PF)       Continuous Infusions:     LOS: 13 days    Time spent: 35 minutes.    Elmarie Shiley, MD [facility]   If 7PM-7AM, please contact night-coverage [email or web address]  05/30/2021, 1:49 PM

## 2021-05-31 ENCOUNTER — Encounter (HOSPITAL_COMMUNITY): Payer: Self-pay | Admitting: Internal Medicine

## 2021-05-31 ENCOUNTER — Other Ambulatory Visit: Payer: Managed Care, Other (non HMO)

## 2021-05-31 ENCOUNTER — Inpatient Hospital Stay (HOSPITAL_COMMUNITY): Payer: Commercial Managed Care - HMO | Admitting: Anesthesiology

## 2021-05-31 ENCOUNTER — Inpatient Hospital Stay: Payer: Commercial Managed Care - HMO

## 2021-05-31 ENCOUNTER — Inpatient Hospital Stay: Payer: Commercial Managed Care - HMO | Admitting: Physician Assistant

## 2021-05-31 ENCOUNTER — Other Ambulatory Visit: Payer: Self-pay

## 2021-05-31 ENCOUNTER — Encounter (HOSPITAL_COMMUNITY)
Admission: EM | Disposition: A | Discharge status: Home Health | Payer: Self-pay | Source: Home / Self Care | Attending: Internal Medicine

## 2021-05-31 DIAGNOSIS — L080 Pyoderma: Secondary | ICD-10-CM

## 2021-05-31 DIAGNOSIS — R5381 Other malaise: Secondary | ICD-10-CM

## 2021-05-31 DIAGNOSIS — L03116 Cellulitis of left lower limb: Secondary | ICD-10-CM | POA: Diagnosis not present

## 2021-05-31 DIAGNOSIS — A4151 Sepsis due to Escherichia coli [E. coli]: Secondary | ICD-10-CM

## 2021-05-31 DIAGNOSIS — L03114 Cellulitis of left upper limb: Secondary | ICD-10-CM | POA: Diagnosis not present

## 2021-05-31 DIAGNOSIS — A4150 Gram-negative sepsis, unspecified: Secondary | ICD-10-CM

## 2021-05-31 DIAGNOSIS — M7989 Other specified soft tissue disorders: Secondary | ICD-10-CM

## 2021-05-31 DIAGNOSIS — N179 Acute kidney failure, unspecified: Secondary | ICD-10-CM | POA: Diagnosis not present

## 2021-05-31 DIAGNOSIS — I10 Essential (primary) hypertension: Secondary | ICD-10-CM

## 2021-05-31 HISTORY — PX: INCISION AND DRAINAGE ABSCESS: SHX5864

## 2021-05-31 LAB — CBC WITH DIFFERENTIAL/PLATELET
Abs Immature Granulocytes: 0.3 10*3/uL — ABNORMAL HIGH (ref 0.00–0.07)
Basophils Absolute: 0 10*3/uL (ref 0.0–0.1)
Basophils Relative: 1 %
Eosinophils Absolute: 0 10*3/uL (ref 0.0–0.5)
Eosinophils Relative: 0 %
HCT: 28.3 % — ABNORMAL LOW (ref 39.0–52.0)
Hemoglobin: 9 g/dL — ABNORMAL LOW (ref 13.0–17.0)
Immature Granulocytes: 5 %
Lymphocytes Relative: 10 %
Lymphs Abs: 0.6 10*3/uL — ABNORMAL LOW (ref 0.7–4.0)
MCH: 28.7 pg (ref 26.0–34.0)
MCHC: 31.8 g/dL (ref 30.0–36.0)
MCV: 90.1 fL (ref 80.0–100.0)
Monocytes Absolute: 1.5 10*3/uL — ABNORMAL HIGH (ref 0.1–1.0)
Monocytes Relative: 27 %
Neutro Abs: 3.3 10*3/uL (ref 1.7–7.7)
Neutrophils Relative %: 57 %
Platelets: 72 10*3/uL — ABNORMAL LOW (ref 150–400)
RBC: 3.14 MIL/uL — ABNORMAL LOW (ref 4.22–5.81)
RDW: 15.9 % — ABNORMAL HIGH (ref 11.5–15.5)
WBC: 5.7 10*3/uL (ref 4.0–10.5)
nRBC: 0.9 % — ABNORMAL HIGH (ref 0.0–0.2)

## 2021-05-31 LAB — BASIC METABOLIC PANEL
Anion gap: 5 (ref 5–15)
BUN: 10 mg/dL (ref 8–23)
CO2: 25 mmol/L (ref 22–32)
Calcium: 8.8 mg/dL — ABNORMAL LOW (ref 8.9–10.3)
Chloride: 108 mmol/L (ref 98–111)
Creatinine, Ser: 0.69 mg/dL (ref 0.61–1.24)
GFR, Estimated: >60 mL/min (ref >60)
Glucose, Bld: 102 mg/dL — ABNORMAL HIGH (ref 70–99)
Potassium: 3.9 mmol/L (ref 3.5–5.1)
Sodium: 138 mmol/L (ref 135–145)

## 2021-05-31 LAB — RENAL FUNCTION PANEL
Albumin: 2.4 g/dL — ABNORMAL LOW (ref 3.5–5.0)
Anion gap: 5 (ref 5–15)
BUN: 12 mg/dL (ref 8–23)
CO2: 25 mmol/L (ref 22–32)
Calcium: 9.1 mg/dL (ref 8.9–10.3)
Chloride: 105 mmol/L (ref 98–111)
Creatinine, Ser: 0.81 mg/dL (ref 0.61–1.24)
GFR, Estimated: >60 mL/min (ref >60)
Glucose, Bld: 159 mg/dL — ABNORMAL HIGH (ref 70–99)
Phosphorus: 2.5 mg/dL (ref 2.5–4.6)
Potassium: 4.4 mmol/L (ref 3.5–5.1)
Sodium: 135 mmol/L (ref 135–145)

## 2021-05-31 LAB — MAGNESIUM: Magnesium: 1.9 mg/dL (ref 1.7–2.4)

## 2021-05-31 SURGERY — INCISION AND DRAINAGE, ABSCESS
Anesthesia: General | Site: Hip | Laterality: Left

## 2021-05-31 MED ORDER — BUPIVACAINE-EPINEPHRINE (PF) 0.25% -1:200000 IJ SOLN
INTRAMUSCULAR | Status: AC
  Filled 2021-05-31: qty 30

## 2021-05-31 MED ORDER — ONDANSETRON HCL 4 MG/2ML IJ SOLN: 4.0000 mg | Freq: Once | INTRAMUSCULAR | Status: DC | PRN

## 2021-05-31 MED ORDER — DEXAMETHASONE SODIUM PHOSPHATE 10 MG/ML IJ SOLN
INTRAMUSCULAR | Status: AC
  Filled 2021-05-31: qty 1

## 2021-05-31 MED ORDER — MEPERIDINE HCL 50 MG/ML IJ SOLN: 6.2500 mg | INTRAMUSCULAR | Status: DC | PRN

## 2021-05-31 MED ORDER — PHENYLEPHRINE HCL (PRESSORS) 10 MG/ML IV SOLN
INTRAVENOUS | Status: AC
  Filled 2021-05-31: qty 1

## 2021-05-31 MED ORDER — PROPOFOL 10 MG/ML IV BOLUS
INTRAVENOUS | Status: AC
  Filled 2021-05-31: qty 20

## 2021-05-31 MED ORDER — HYDROMORPHONE HCL 1 MG/ML IJ SOLN: 0.2500 mg | INTRAMUSCULAR | Status: DC | PRN

## 2021-05-31 MED ORDER — GABAPENTIN 300 MG PO CAPS
300.0000 mg | ORAL_CAPSULE | ORAL | Status: AC
  Administered 2021-05-31: 300 mg via ORAL
  Filled 2021-05-31: qty 1

## 2021-05-31 MED ORDER — MIDAZOLAM HCL 2 MG/2ML IJ SOLN
INTRAMUSCULAR | Status: AC
  Filled 2021-05-31: qty 2

## 2021-05-31 MED ORDER — ROCURONIUM BROMIDE 10 MG/ML (PF) SYRINGE
PREFILLED_SYRINGE | INTRAVENOUS | Status: AC
  Filled 2021-05-31: qty 10

## 2021-05-31 MED ORDER — PHENYLEPHRINE HCL-NACL 20-0.9 MG/250ML-% IV SOLN
INTRAVENOUS | Status: DC | PRN
  Administered 2021-05-31: 100 ug/min via INTRAVENOUS

## 2021-05-31 MED ORDER — LIDOCAINE HCL (PF) 2 % IJ SOLN
INTRAMUSCULAR | Status: AC
  Filled 2021-05-31: qty 5

## 2021-05-31 MED ORDER — ONDANSETRON HCL 4 MG/2ML IJ SOLN
INTRAMUSCULAR | Status: AC
  Filled 2021-05-31: qty 2

## 2021-05-31 MED ORDER — CHLORHEXIDINE GLUCONATE CLOTH 2 % EX PADS: 6.0000 | MEDICATED_PAD | Freq: Once | CUTANEOUS | Status: AC

## 2021-05-31 MED ORDER — MIDAZOLAM HCL 5 MG/5ML IJ SOLN
INTRAMUSCULAR | Status: DC | PRN
Start: 2021-05-31 — End: 2021-05-31
  Administered 2021-05-31: 1 mg via INTRAVENOUS

## 2021-05-31 MED ORDER — LIDOCAINE HCL (CARDIAC) PF 100 MG/5ML IV SOSY
PREFILLED_SYRINGE | INTRAVENOUS | Status: DC | PRN
  Administered 2021-05-31: 100 mg via INTRAVENOUS

## 2021-05-31 MED ORDER — FENTANYL CITRATE (PF) 100 MCG/2ML IJ SOLN
INTRAMUSCULAR | Status: DC | PRN
  Administered 2021-05-31: 50 ug via INTRAVENOUS

## 2021-05-31 MED ORDER — PHENYLEPHRINE 80 MCG/ML (10ML) SYRINGE FOR IV PUSH (FOR BLOOD PRESSURE SUPPORT)
PREFILLED_SYRINGE | INTRAVENOUS | Status: DC | PRN
  Administered 2021-05-31 (×5): 160 ug via INTRAVENOUS

## 2021-05-31 MED ORDER — CEFAZOLIN SODIUM-DEXTROSE 2-4 GM/100ML-% IV SOLN
2.0000 g | INTRAVENOUS | Status: AC
  Administered 2021-05-31: 2 g via INTRAVENOUS

## 2021-05-31 MED ORDER — DEXAMETHASONE SODIUM PHOSPHATE 10 MG/ML IJ SOLN
INTRAMUSCULAR | Status: DC | PRN
  Administered 2021-05-31: 10 mg via INTRAVENOUS

## 2021-05-31 MED ORDER — ONDANSETRON HCL 4 MG/2ML IJ SOLN
INTRAMUSCULAR | Status: DC | PRN
  Administered 2021-05-31: 4 mg via INTRAVENOUS

## 2021-05-31 MED ORDER — LACTATED RINGERS IV SOLN: INTRAVENOUS | Status: DC | PRN

## 2021-05-31 MED ORDER — CEFAZOLIN SODIUM 1 G IJ SOLR
INTRAMUSCULAR | Status: AC
  Filled 2021-05-31: qty 20

## 2021-05-31 MED ORDER — MORPHINE SULFATE (PF) 2 MG/ML IV SOLN: 2.0000 mg | INTRAVENOUS | Status: DC | PRN

## 2021-05-31 MED ORDER — PROPOFOL 10 MG/ML IV BOLUS
INTRAVENOUS | Status: DC | PRN
  Administered 2021-05-31: 150 mg via INTRAVENOUS

## 2021-05-31 MED ORDER — ACETAMINOPHEN 10 MG/ML IV SOLN: 1000.0000 mg | Freq: Once | INTRAVENOUS | Status: DC | PRN

## 2021-05-31 MED ORDER — CHLORHEXIDINE GLUCONATE CLOTH 2 % EX PADS: 6.0000 | MEDICATED_PAD | Freq: Once | CUTANEOUS | Status: DC

## 2021-05-31 MED ORDER — FENTANYL CITRATE (PF) 100 MCG/2ML IJ SOLN
INTRAMUSCULAR | Status: AC
  Filled 2021-05-31: qty 2

## 2021-05-31 MED ORDER — ACETAMINOPHEN 500 MG PO TABS
1000.0000 mg | ORAL_TABLET | ORAL | Status: AC
  Administered 2021-05-31: 1000 mg via ORAL
  Filled 2021-05-31: qty 2

## 2021-05-31 MED ORDER — BUPIVACAINE-EPINEPHRINE 0.25% -1:200000 IJ SOLN
INTRAMUSCULAR | Status: DC | PRN
  Administered 2021-05-31: 30 mL

## 2021-05-31 SURGICAL SUPPLY — 33 items
BAG COUNTER SPONGE SURGICOUNT (BAG) IMPLANT
BLADE SURG 15 STRL LF DISP TIS (BLADE) ×1 IMPLANT
BLADE SURG 15 STRL SS (BLADE) ×2
COVER SURGICAL LIGHT HANDLE (MISCELLANEOUS) ×2 IMPLANT
DRAPE LAPAROTOMY T 102X78X121 (DRAPES) ×2 IMPLANT
DRSG PAD ABDOMINAL 8X10 ST (GAUZE/BANDAGES/DRESSINGS) ×2 IMPLANT
ELECT REM PT RETURN 15FT ADLT (MISCELLANEOUS) ×2 IMPLANT
GAUZE 4X4 16PLY ~~LOC~~+RFID DBL (SPONGE) ×2 IMPLANT
GAUZE SPONGE 4X4 12PLY STRL (GAUZE/BANDAGES/DRESSINGS) ×2 IMPLANT
GLOVE ECLIPSE 8.0 STRL XLNG CF (GLOVE) ×2 IMPLANT
GLOVE INDICATOR 8.0 STRL GRN (GLOVE) ×2 IMPLANT
GOWN STRL REUS W/ TWL XL LVL3 (GOWN DISPOSABLE) ×3 IMPLANT
GOWN STRL REUS W/TWL XL LVL3 (GOWN DISPOSABLE) ×6
KIT BASIN OR (CUSTOM PROCEDURE TRAY) ×2 IMPLANT
KIT TURNOVER KIT A (KITS) IMPLANT
NEEDLE HYPO 22GX1.5 SAFETY (NEEDLE) ×2 IMPLANT
PACK BASIC VI WITH GOWN DISP (CUSTOM PROCEDURE TRAY) ×2 IMPLANT
PANTS MESH DISP LRG (UNDERPADS AND DIAPERS) ×1 IMPLANT
PANTS MESH DISPOSABLE L (UNDERPADS AND DIAPERS) ×1
PENCIL SMOKE EVACUATOR (MISCELLANEOUS) IMPLANT
SUCTION FRAZIER HANDLE 12FR (TUBING)
SUCTION TUBE FRAZIER 12FR DISP (TUBING) IMPLANT
SURGILUBE 2OZ TUBE FLIPTOP (MISCELLANEOUS) ×2 IMPLANT
SUT CHROMIC 2 0 SH (SUTURE) IMPLANT
SUT CHROMIC 3 0 SH 27 (SUTURE) IMPLANT
SUT VIC AB 2-0 UR6 27 (SUTURE) IMPLANT
SWAB COLLECTION DEVICE MRSA (MISCELLANEOUS) IMPLANT
SWAB CULTURE ESWAB REG 1ML (MISCELLANEOUS) IMPLANT
SYR 20ML LL LF (SYRINGE) ×2 IMPLANT
SYR 3ML LL SCALE MARK (SYRINGE) IMPLANT
SYR BULB IRRIG 60ML STRL (SYRINGE) IMPLANT
TOWEL OR 17X26 10 PK STRL BLUE (TOWEL DISPOSABLE) ×2 IMPLANT
TOWEL OR NON WOVEN STRL DISP B (DISPOSABLE) ×2 IMPLANT

## 2021-05-31 NOTE — Transfer of Care (Signed)
Immediate Anesthesia Transfer of Care Note  Patient: Adam [name]  Procedure(s) Performed: Left Hip Excision (Left: Hip)  Patient Location: PACU  Anesthesia Type:General  Level of Consciousness: sedated  Airway & Oxygen Therapy: Patient Spontanous Breathing and Patient connected to face mask oxygen  Post-op Assessment: Report given to RN and Post -op Vital signs reviewed and stable  Post vital signs: Reviewed and stable  Last Vitals:  Vitals Value Taken Time  BP    Temp    Pulse 93 05/31/21 1415  Resp 16 05/31/21 1415  SpO2 100 % 05/31/21 1415  Vitals shown include unvalidated device data.  Last Pain:  Vitals:   05/31/21 1217  TempSrc:   PainSc: 0-No pain      Patients Stated Pain Goal: 0 (66/59/93 5701)  Complications: No notable events documented.

## 2021-05-31 NOTE — TOC Progression Note (Signed)
Transition of Care [facility]) - Progression Note    Patient Details  Name: Adam [name] MRN: [id number] Date of Birth: 1955/05/06  Transition of [facility]) CM/SW Contact  Leeroy Cha, RN Phone Number: 05/31/2021, 9:23 AM  Clinical Narrative:    Following for toc needs possible hhc.   Expected Discharge Plan: [place] Barriers to Discharge: No Barriers Identified  Expected Discharge Plan and Services Expected Discharge Plan: [place] arrangements for the past 2 months: Single Family Home                                       Social Determinants of Health (SDOH) Interventions    Readmission Risk Interventions     View : No data to display.

## 2021-05-31 NOTE — Progress Notes (Signed)
PROGRESS NOTE  Adam [name] OVF:[id number] DOB: 1955-03-01   PCP: [place], [name], MD  Patient is from: Home.  Lives alone but recently ex-wife helping.  Uses cane at baseline.  DOA: 05/17/2021 LOS: 89  Chief complaints Chief Complaint  Patient presents with   Fall   Tachycardia   Wound Check     Brief Narrative / Interim history: 66 year old M with PMH of metastatic small cell lung cancer and HTN presenting from concentrator due to left knee and left forearm swelling, and fall at home.  He underwent bedside debridement in ER for his left forearm and knee swelling with packing.  He was started on IV antibiotics.  After admission, he was found to have E. coli bacteremia.  Infectious disease and ID consulted.  He was reevaluated by orthopedic surgery on 5/18 and underwent I&D of left proximal forearm wound.  Wound culture grew E. coli.  Port a cath removed on 5/19.  Orthopedic surgery recommended CT of his thigh and knee, that showed 3 superficial abscesses of the left hip, lateral knee and proximal lower leg.  Ortho recommended general surgery and plastic surgery consultation.  Patient underwent excision of left hip subcutaneous tissue mass on 05/31/2021.  Patient is currently on cefadroxil per ID recommendation.   Subjective: Seen and examined earlier this morning.  No major events overnight of this morning.  No complaints.  He denies pain, shortness of breath, GI or UTI symptoms.  Objective: Vitals:   05/31/21 1430 05/31/21 1445 05/31/21 1500 05/31/21 1524  BP: 106/70 106/72 107/71 109/74  Pulse: 93 92 92 92  Resp: 17 18 17 16   Temp:   98 F (36.7 C) 98 F (36.7 C)  TempSrc:    Oral  SpO2: 96% 100% 94% 96%  Weight:      Height:        Examination:  GENERAL: Appears frail.  Nontoxic. HEENT: MMM.  Vision and hearing grossly intact.  NECK: Supple.  No apparent JVD.  RESP:  No IWOB.  Fair aeration bilaterally. CVS:  RRR. Heart sounds normal.  ABD/GI/GU: BS+. Abd soft, NTND.   MSK/EXT:  Moves extremities.  Dressing and Ace wrap over left forearm.  Dressing over left knee with packing. SKIN: Clear looking wound over left knee with packing.  Subcutaneous mass over left hip. NEURO: Awake, alert and oriented appropriately.  No apparent focal neuro deficit. PSYCH: Calm. Normal affect.   Procedures:  5/10-bedside I&D of left forearm and left knee 5/18-I&D of left forearm and left elbow by orthopedic surgery 5/24-excisional debridement of subcutaneous soft tissue left hip mass  Microbiology summarized: 5/10-blood culture with pansensitive E. coli. 5/12-blood cultures NGTD 5/18-abscess culture from left lateral forearm and left elbow with E. coli. 5/24-tissue culture from left thigh/hip pending.  Assessment and Plan: * Sepsis due to E. coli bacteremia, left forearm and left lower extremity cellulitis/abscess Blood culture with E. coli bacteremia.  Patient with left forearm and left lower extremity cellulitis with abscess.  He was tachycardic with tachypnea and neutropenia although neutropenia could be due to recent chemotherapy.  Sepsis physiology resolved. -S/p I&D and packing in ED on 5/10 -S/p  I&D by orthopedic surgery on 5/18.  Surgical culture with pansensitive E. coli -S/p excisional debridement of left hip subcutaneous tissue mass on 5/24 -Follow surgical pathology and culture from 5/24 -Continue cefadroxil per ID -Appreciate help by ID, Ortho, plastic surgery and general surgery  Severe neutropenia ([place]) Felt to be due to chemotherapy.  Received Granix  on 5/20.  Resolved.  Primary cancer of right upper lobe of lung [facility]) Per oncology.  Thrombocytopenia ([place]) Likely due to chemotherapy.  Transfused about 3 apheresis so far.  Improving. -Transfuse for platelet count less than<20k per oncology.   AKI (acute kidney injury) [facility]) Resolved   Physical deconditioning PT/OT eval-recommended home meds and DME's.  HTN (hypertension) Normotensive off  home amlodipine, lisinopril and HCTZ.    DVT prophylaxis:  SCD's Start: 05/31/21 0743 SCDs Start: 05/17/21 1848  Code Status: Full code Family Communication: Patient and/or RN. Available if any question.  Level of care: Progressive Status is: Inpatient Remains inpatient appropriate because: Sepsis due to left upper extremity and lower extremity cellulitis/abscess with bacteremia   Final disposition: Home with home health once medically cleared Consultants:  Infectious disease General surgery Plastic surgery Orthopedic surgery  Sch Meds:  Scheduled Meds:  sodium chloride   Intravenous Once   sodium chloride   Intravenous Once   cefadroxil  1,000 mg Oral BID   Chlorhexidine Gluconate Cloth  6 each Topical Daily   collagenase   Topical Daily   feeding supplement  237 mL Oral TID BM   Continuous Infusions: PRN Meds:.acetaminophen **OR** [DISCONTINUED] acetaminophen, HYDROcodone-acetaminophen, morphine injection, sodium chloride flush  Antimicrobials: Anti-infectives (From admission, onward)    Start     Dose/Rate Route Frequency Ordered Stop   05/31/21 1245  ceFAZolin (ANCEF) IVPB 2g/100 mL premix       Note to Pharmacy: Pharmacy may adjust dosing strength, interval, or rate of medication as needed for optimal therapy for the patient  Send with patient on call to the OR.  Anesthesia to complete antibiotic administration <35min prior to incision per [facility].   2 g 200 mL/hr over 30 Minutes Intravenous On call to O.R. 05/31/21 1240 05/31/21 1336   05/29/21 1415  cefadroxil (DURICEF) capsule 1,000 mg        1,000 mg Oral 2 times daily 05/29/21 1322     05/21/21 1400  ceFAZolin (ANCEF) IVPB 2g/100 mL premix  Status:  Discontinued        2 g 200 mL/hr over 30 Minutes Intravenous Every 8 hours 05/21/21 0945 05/29/21 1322   05/19/21 1800  vancomycin (VANCOREADY) IVPB 1500 mg/300 mL  Status:  Discontinued        1,500 mg 150 mL/hr over 120 Minutes Intravenous Every 24  hours 05/19/21 0718 05/21/21 0944   05/18/21 1800  vancomycin (VANCOREADY) IVPB 1250 mg/250 mL  Status:  Discontinued        1,250 mg 166.7 mL/hr over 90 Minutes Intravenous Every 24 hours 05/17/21 1842 05/19/21 0718   05/18/21 1300  cefTRIAXone (ROCEPHIN) 2 g in sodium chloride 0.9 % 100 mL IVPB  Status:  Discontinued        2 g 200 mL/hr over 30 Minutes Intravenous Every 24 hours 05/17/21 1847 05/21/21 0944   05/17/21 1300  vancomycin (VANCOREADY) IVPB 1500 mg/300 mL        1,500 mg 150 mL/hr over 120 Minutes Intravenous  Once 05/17/21 1233 05/17/21 1907   05/17/21 1215  vancomycin (VANCOCIN) IVPB 1000 mg/200 mL premix  Status:  Discontinued        1,000 mg 200 mL/hr over 60 Minutes Intravenous  Once 05/17/21 1213 05/17/21 1233   05/17/21 1215  cefTRIAXone (ROCEPHIN) 2 g in sodium chloride 0.9 % 100 mL IVPB        2 g 200 mL/hr over 30 Minutes Intravenous  Once 05/17/21 1213 05/17/21 1422  I have personally reviewed the following labs and images: CBC: Recent Labs  Lab 05/27/21 0428 05/28/21 0325 05/29/21 0357 05/30/21 0347 05/31/21 0400  WBC 3.2* 4.8 6.9 7.3 5.7  NEUTROABS 2.2 3.2 4.3 4.5 3.3  HGB 8.9* 8.6* 8.4* 8.5* 9.0*  HCT 26.9* 26.8* 26.6* 26.5* 28.3*  MCV 88.8 89.0 89.6 90.1 90.1  PLT 35* 34* 39* 51* 72*   BMP &GFR Recent Labs  Lab 05/25/21 0800 05/26/21 0454 05/27/21 0428 05/28/21 0325 05/29/21 0356 05/30/21 0347 05/31/21 0400  NA 142   < > 141 142 140 138 138  K 3.6   < > 3.6 3.7 4.0 4.0 3.9  CL 111   < > 110 110 110 107 108  CO2 26   < > 26 27 25 26 25   GLUCOSE 124*   < > 104* 104* 91 102* 102*  BUN 15   < > 14 14 12 13 10   CREATININE 0.75   < > 0.74 0.73 0.63 0.72 0.69  CALCIUM 8.3*   < > 8.6* 8.6* 8.5* 8.6* 8.8*  MG 1.8  --   --  1.8  --   --   --    < > = values in this interval not displayed.   Estimated Creatinine Clearance: 93.3 mL/min (by C-G formula based on SCr of 0.69 mg/dL). Liver & Pancreas: No results for input(s): AST, ALT,  ALKPHOS, BILITOT, PROT, ALBUMIN in the last 168 hours. No results for input(s): LIPASE, AMYLASE in the last 168 hours. No results for input(s): AMMONIA in the last 168 hours. Diabetic: No results for input(s): HGBA1C in the last 72 hours. No results for input(s): GLUCAP in the last 168 hours. Cardiac Enzymes: No results for input(s): CKTOTAL, CKMB, CKMBINDEX, TROPONINI in the last 168 hours. No results for input(s): PROBNP in the last 8760 hours. Coagulation Profile: No results for input(s): INR, PROTIME in the last 168 hours. Thyroid Function Tests: No results for input(s): TSH, T4TOTAL, FREET4, T3FREE, THYROIDAB in the last 72 hours. Lipid Profile: No results for input(s): CHOL, HDL, LDLCALC, TRIG, CHOLHDL, LDLDIRECT in the last 72 hours. Anemia Panel: No results for input(s): VITAMINB12, FOLATE, FERRITIN, TIBC, IRON, RETICCTPCT in the last 72 hours. Urine analysis:    Component Value Date/Time   COLORURINE YELLOW 05/17/2021 1142   APPEARANCEUR HAZY (A) 05/17/2021 1142   LABSPEC 1.012 05/17/2021 1142   PHURINE 7.0 05/17/2021 1142   GLUCOSEU NEGATIVE 05/17/2021 1142   HGBUR NEGATIVE 05/17/2021 1142   BILIRUBINUR NEGATIVE 05/17/2021 1142   [place] 05/17/2021 1142   PROTEINUR 30 (A) 05/17/2021 1142   UROBILINOGEN 1.0 01/20/2020 1330   NITRITE POSITIVE (A) 05/17/2021 1142   LEUKOCYTESUR NEGATIVE 05/17/2021 1142   Sepsis Labs: Invalid input(s): PROCALCITONIN, [place]  Microbiology: Recent Results (from the past 240 hour(s))  Aerobic/Anaerobic Culture w Gram Stain (surgical/deep wound)     Status: None   Collection Time: 05/25/21 12:02 PM   Specimen: Tissue  Result Value Ref Range Status   Specimen Description   Final    TISSUE UNDERNEATH LT ELBOW Performed at [facility] [address], [place], [place] [address]    Special Requests   Final    NONE Performed at [facility], [place] [address], [place], [place]  [address]    Gram Stain NO WBC SEEN NO ORGANISMS SEEN   Final   Culture   Final    MODERATE ESCHERICHIA COLI CRITICAL RESULT CALLED TO, READ BACK BY AND VERIFIED WITH:  RN CELIA CALLENDER [phone] FCP NO ANAEROBES ISOLATED Performed at [facility], [place] [address] [place], [place] [address]    Report Status 05/30/2021 FINAL  Final   Organism ID, Bacteria ESCHERICHIA COLI  Final      Susceptibility   Escherichia coli - MIC*    AMPICILLIN <=2 SENSITIVE Sensitive     CEFAZOLIN <=4 SENSITIVE Sensitive     CEFEPIME <=0.12 SENSITIVE Sensitive     CEFTAZIDIME <=1 SENSITIVE Sensitive     CEFTRIAXONE <=0.25 SENSITIVE Sensitive     CIPROFLOXACIN <=0.25 SENSITIVE Sensitive     GENTAMICIN <=1 SENSITIVE Sensitive     IMIPENEM <=0.25 SENSITIVE Sensitive     TRIMETH/SULFA <=20 SENSITIVE Sensitive     AMPICILLIN/SULBACTAM <=2 SENSITIVE Sensitive     PIP/TAZO <=4 SENSITIVE Sensitive     * MODERATE ESCHERICHIA COLI  Aerobic Culture w Gram Stain (superficial specimen)     Status: None   Collection Time: 05/25/21 12:05 PM   Specimen: Abscess  Result Value Ref Range Status   Specimen Description   Final    ABSCESS LT LAT FOREARM Performed at [facility] [address] [place], [place] [address]    Special Requests   Final    NONE Performed at [facility], [place] [address] [place], [place] [address]    Gram Stain   Final    NO WBC SEEN RARE GRAM NEGATIVE RODS Performed at [facility], [place] [address], [place], [place] [address]    Culture RARE ESCHERICHIA COLI  Final   Report Status 05/27/2021 FINAL  Final   Organism ID, Bacteria ESCHERICHIA COLI  Final      Susceptibility   Escherichia coli - MIC*    AMPICILLIN <=2 SENSITIVE Sensitive     CEFAZOLIN <=4 SENSITIVE Sensitive     CEFEPIME <=0.12 SENSITIVE Sensitive     CEFTAZIDIME <=1 SENSITIVE Sensitive     CEFTRIAXONE <=0.25 SENSITIVE Sensitive     CIPROFLOXACIN <=0.25 SENSITIVE  Sensitive     GENTAMICIN <=1 SENSITIVE Sensitive     IMIPENEM <=0.25 SENSITIVE Sensitive     TRIMETH/SULFA <=20 SENSITIVE Sensitive     AMPICILLIN/SULBACTAM <=2 SENSITIVE Sensitive     PIP/TAZO <=4 SENSITIVE Sensitive     * RARE ESCHERICHIA COLI    Radiology Studies: No results found.    Berry Godsey T. Millhousen  If 7PM-7AM, please contact night-coverage [email or web address] 05/31/2021, 6:45 PM

## 2021-05-31 NOTE — Anesthesia Preprocedure Evaluation (Signed)
Anesthesia Evaluation  Patient identified by MRN, date of birth, ID band Patient awake    Reviewed: Allergy & Precautions, NPO status , Patient's Chart, lab work & pertinent test results  History of Anesthesia Complications Negative for: history of anesthetic complications  Airway Mallampati: I       Dental  (+) Dental Advisory Given, Edentulous Upper   Pulmonary former smoker,   Lung mass    Pulmonary exam normal        Cardiovascular hypertension, Pt. on medications Normal cardiovascular exam     Neuro/Psych negative neurological ROS  negative psych ROS   GI/Hepatic negative GI ROS, Neg liver ROS,   Endo/Other  negative endocrine ROS  Renal/GU negative Renal ROS  negative genitourinary   Musculoskeletal   Abdominal Normal abdominal exam  (+)   Peds  Hematology  (+) Blood dyscrasia, anemia ,   Anesthesia Other Findings    Reproductive/Obstetrics                             Anesthesia Physical  Anesthesia Plan  ASA: II  Anesthesia Plan: General   Post-op Pain Management:    Induction: Intravenous  PONV Risk Score and Plan: 3 and Treatment may vary due to age or medical condition, Ondansetron, Dexamethasone and Midazolam  Airway Management Planned: LMA  Additional Equipment: None  Intra-op Plan:   Post-operative Plan: Extubation in OR  Informed Consent: I have reviewed the patients History and Physical, chart, labs and discussed the procedure including the risks, benefits and alternatives for the proposed anesthesia with the patient or authorized representative who has indicated his/her understanding and acceptance.     Dental advisory given  Plan Discussed with: CRNA  Anesthesia Plan Comments:         Anesthesia Quick Evaluation

## 2021-05-31 NOTE — Assessment & Plan Note (Signed)
PT/OT eval-recommended home meds and DME's.

## 2021-05-31 NOTE — Anesthesia Postprocedure Evaluation (Signed)
Anesthesia Post Note  Patient: Adam [name]  Procedure(s) Performed: Left Hip Excision (Left: Hip)     Patient location during evaluation: PACU Anesthesia Type: General Level of consciousness: awake Pain management: pain level controlled Vital Signs Assessment: post-procedure vital signs reviewed and stable Respiratory status: spontaneous breathing Cardiovascular status: stable Postop Assessment: no apparent nausea or vomiting Anesthetic complications: no   No notable events documented.  Last Vitals:  Vitals:   05/31/21 1500 05/31/21 1524  BP: 107/71 109/74  Pulse: 92 92  Resp: 17 16  Temp: 36.7 C 36.7 C  SpO2: 94% 96%    Last Pain:  Vitals:   05/31/21 1524  TempSrc: Oral  PainSc:                  Huston Foley

## 2021-05-31 NOTE — H&P (View-Only) (Signed)
Day of Surgery  Subjective: No new complaints  ROS: See above, otherwise other systems negative  Objective: Vital signs in last 24 hours: Temp:  [98 F (36.7 C)-98.7 F (37.1 C)] 98 F (36.7 C) (05/24 0445) Pulse Rate:  [92-99] 92 (05/24 0445) Resp:  [18-20] 20 (05/24 0445) BP: (113-122)/(76-86) 122/86 (05/24 0445) SpO2:  [96 %-100 %] 96 % (05/24 0445) Last BM Date : 05/25/21  Intake/Output from previous day: 05/23 0701 - 05/24 0700 In: 915 [P.O.:915] Out: 1450 [Urine:1450] Intake/Output this shift: No intake/output data recorded.  PE: Skin: left hip mass stable today  Lab Results:  Recent Labs    05/30/21 0347 05/31/21 0400  WBC 7.3 5.7  HGB 8.5* 9.0*  HCT 26.5* 28.3*  PLT 51* 72*   BMET Recent Labs    05/30/21 0347 05/31/21 0400  NA 138 138  K 4.0 3.9  CL 107 108  CO2 26 25  GLUCOSE 102* 102*  BUN 13 10  CREATININE 0.72 0.69  CALCIUM 8.6* 8.8*   PT/INR No results for input(s): LABPROT, INR in the last 72 hours. CMP     Component Value Date/Time   NA 138 05/31/2021 0400   K 3.9 05/31/2021 0400   CL 108 05/31/2021 0400   CO2 25 05/31/2021 0400   GLUCOSE 102 (H) 05/31/2021 0400   BUN 10 05/31/2021 0400   CREATININE 0.69 05/31/2021 0400   CREATININE 0.89 05/10/2021 0933   CALCIUM 8.8 (L) 05/31/2021 0400   PROT 6.3 (L) 05/18/2021 0501   ALBUMIN 2.2 (L) 05/18/2021 0501   AST 23 05/18/2021 0501   AST 20 05/10/2021 0933   ALT 26 05/18/2021 0501   ALT 21 05/10/2021 0933   ALKPHOS 44 05/18/2021 0501   BILITOT 1.0 05/18/2021 0501   BILITOT 0.6 05/10/2021 0933   GFRNONAA >60 05/31/2021 0400   GFRNONAA >60 05/10/2021 0933   Lipase  No results found for: LIPASE     Studies/Results: CT FEMUR LEFT W CONTRAST  Result Date: 05/30/2021 CLINICAL DATA:  Left lateral thigh/knee wound. Left knee soft tissue abscess incision and drainage in the ER 2 weeks ago. History of lung cancer. EXAM: CT OF THE LOWER LEFT EXTREMITY WITH CONTRAST TECHNIQUE:  Multidetector CT imaging of the left femur and knee was performed according to the standard protocol following intravenous contrast administration. RADIATION DOSE REDUCTION: This exam was performed according to the departmental dose-optimization program which includes automated exposure control, adjustment of the mA and/or kV according to patient size and/or use of iterative reconstruction technique. CONTRAST:  129mL OMNIPAQUE IOHEXOL 300 MG/ML  SOLN COMPARISON:  Left knee x-rays dated May 17, 2021. CT abdomen pelvis dated April 07, 2021. FINDINGS: Bones/Joint/Cartilage Lytic lesions in the left pubic rami and ischial tuberosity again noted with subacute pathologic fracture of the left superior pubic ramus and parasymphyseal pubic bone, similar to prior. Mild left hip osteoarthritis. No hip or knee joint effusion. Ligaments Ligaments are suboptimally evaluated by CT. Muscles and Tendons Grossly intact. Soft tissue Superficial 2.8 x 3.9 x 3.8 cm rim enhancing fluid collection in the posterolateral left hip with overlying prominent skin thickening (series 6, image 72). Slightly more superiorly and deep along the superficial fascia, there is a 2.1 x 1.1 x 2.1 cm rim enhancing fluid collection (series 6, image 63). Superficial 5.5 x 1.1 x 4.5 cm rim enhancing fluid collection in the lateral knee draining to the skin surface (series 6, image 331). Superficial 1.7 x 1.0 x 2.5 cm rim enhancing  fluid collection in the lateral proximal lower leg (series 6, image 363). Prominent skin thickening overlying both fluid collections. No soft tissue mass. IMPRESSION: 1. Three superficial abscesses in the left hip, lateral knee, and proximal lower leg as described above. 2. Smaller 2.1 cm deep abscess along the superficial fascia overlying the left greater trochanter. 3. Unchanged left hemipelvis osseous metastatic disease with pathologic fracture of the left superior pubic ramus. Electronically Signed   By: Titus Dubin M.D.    On: 05/30/2021 12:30   CT KNEE LEFT W CONTRAST  Result Date: 05/30/2021 CLINICAL DATA:  Left lateral thigh/knee wound. Left knee soft tissue abscess incision and drainage in the ER 2 weeks ago. History of lung cancer. EXAM: CT OF THE LOWER LEFT EXTREMITY WITH CONTRAST TECHNIQUE: Multidetector CT imaging of the left femur and knee was performed according to the standard protocol following intravenous contrast administration. RADIATION DOSE REDUCTION: This exam was performed according to the departmental dose-optimization program which includes automated exposure control, adjustment of the mA and/or kV according to patient size and/or use of iterative reconstruction technique. CONTRAST:  18mL OMNIPAQUE IOHEXOL 300 MG/ML  SOLN COMPARISON:  Left knee x-rays dated May 17, 2021. CT abdomen pelvis dated April 07, 2021. FINDINGS: Bones/Joint/Cartilage Lytic lesions in the left pubic rami and ischial tuberosity again noted with subacute pathologic fracture of the left superior pubic ramus and parasymphyseal pubic bone, similar to prior. Mild left hip osteoarthritis. No hip or knee joint effusion. Ligaments Ligaments are suboptimally evaluated by CT. Muscles and Tendons Grossly intact. Soft tissue Superficial 2.8 x 3.9 x 3.8 cm rim enhancing fluid collection in the posterolateral left hip with overlying prominent skin thickening (series 6, image 72). Slightly more superiorly and deep along the superficial fascia, there is a 2.1 x 1.1 x 2.1 cm rim enhancing fluid collection (series 6, image 63). Superficial 5.5 x 1.1 x 4.5 cm rim enhancing fluid collection in the lateral knee draining to the skin surface (series 6, image 331). Superficial 1.7 x 1.0 x 2.5 cm rim enhancing fluid collection in the lateral proximal lower leg (series 6, image 363). Prominent skin thickening overlying both fluid collections. No soft tissue mass. IMPRESSION: 1. Three superficial abscesses in the left hip, lateral knee, and proximal lower leg as  described above. 2. Smaller 2.1 cm deep abscess along the superficial fascia overlying the left greater trochanter. 3. Unchanged left hemipelvis osseous metastatic disease with pathologic fracture of the left superior pubic ramus. Electronically Signed   By: Titus Dubin M.D.   On: 05/30/2021 12:30    Anti-infectives: Anti-infectives (From admission, onward)    Start     Dose/Rate Route Frequency Ordered Stop   05/29/21 1415  cefadroxil (DURICEF) capsule 1,000 mg        1,000 mg Oral 2 times daily 05/29/21 1322     05/21/21 1400  ceFAZolin (ANCEF) IVPB 2g/100 mL premix  Status:  Discontinued        2 g 200 mL/hr over 30 Minutes Intravenous Every 8 hours 05/21/21 0945 05/29/21 1322   05/19/21 1800  vancomycin (VANCOREADY) IVPB 1500 mg/300 mL  Status:  Discontinued        1,500 mg 150 mL/hr over 120 Minutes Intravenous Every 24 hours 05/19/21 0718 05/21/21 0944   05/18/21 1800  vancomycin (VANCOREADY) IVPB 1250 mg/250 mL  Status:  Discontinued        1,250 mg 166.7 mL/hr over 90 Minutes Intravenous Every 24 hours 05/17/21 1842 05/19/21 0718   05/18/21  1300  cefTRIAXone (ROCEPHIN) 2 g in sodium chloride 0.9 % 100 mL IVPB  Status:  Discontinued        2 g 200 mL/hr over 30 Minutes Intravenous Every 24 hours 05/17/21 1847 05/21/21 0944   05/17/21 1300  vancomycin (VANCOREADY) IVPB 1500 mg/300 mL        1,500 mg 150 mL/hr over 120 Minutes Intravenous  Once 05/17/21 1233 05/17/21 1907   05/17/21 1215  vancomycin (VANCOCIN) IVPB 1000 mg/200 mL premix  Status:  Discontinued        1,000 mg 200 mL/hr over 60 Minutes Intravenous  Once 05/17/21 1213 05/17/21 1233   05/17/21 1215  cefTRIAXone (ROCEPHIN) 2 g in sodium chloride 0.9 % 100 mL IVPB        2 g 200 mL/hr over 30 Minutes Intravenous  Once 05/17/21 1213 05/17/21 1422        Assessment/Plan L lateral thigh lesion/abscess -sister on the phone today.  She actually states he has had this area on his hip for years and she has been asking  him to have it looked at and addressed. -he is agreeable to move forward with surgery today to excise this and send for pathology and culture. -procedure discussed along with risks and complications mainly being delayed wound healing.  He is agreeable to move forward   FEN - NPO p MN VTE - on hold due to pancytopenia ID - per ID   E. Coli bacteremia Multiple nonhealing wounds HTN Lung cancer  I reviewed hospitalist notes, last 24 h vitals and pain scores, last 48 h intake and output, last 24 h labs and trends, and last 24 h imaging results.   LOS: 14 days    Henreitta Cea , [facility] Surgery 05/31/2021, 11:19 AM Please see Amion for pager number during day hours 7:00am-4:30pm or 7:00am -11:30am on weekends

## 2021-05-31 NOTE — Progress Notes (Signed)
Day of Surgery  Subjective: No new complaints  ROS: See above, otherwise other systems negative  Objective: Vital signs in last 24 hours: Temp:  [98 F (36.7 C)-98.7 F (37.1 C)] 98 F (36.7 C) (05/24 0445) Pulse Rate:  [92-99] 92 (05/24 0445) Resp:  [18-20] 20 (05/24 0445) BP: (113-122)/(76-86) 122/86 (05/24 0445) SpO2:  [96 %-100 %] 96 % (05/24 0445) Last BM Date : 05/25/21  Intake/Output from previous day: 05/23 0701 - 05/24 0700 In: 915 [P.O.:915] Out: 1450 [Urine:1450] Intake/Output this shift: No intake/output data recorded.  PE: Skin: left hip mass stable today  Lab Results:  Recent Labs    05/30/21 0347 05/31/21 0400  WBC 7.3 5.7  HGB 8.5* 9.0*  HCT 26.5* 28.3*  PLT 51* 72*   BMET Recent Labs    05/30/21 0347 05/31/21 0400  NA 138 138  K 4.0 3.9  CL 107 108  CO2 26 25  GLUCOSE 102* 102*  BUN 13 10  CREATININE 0.72 0.69  CALCIUM 8.6* 8.8*   PT/INR No results for input(s): LABPROT, INR in the last 72 hours. CMP     Component Value Date/Time   NA 138 05/31/2021 0400   K 3.9 05/31/2021 0400   CL 108 05/31/2021 0400   CO2 25 05/31/2021 0400   GLUCOSE 102 (H) 05/31/2021 0400   BUN 10 05/31/2021 0400   CREATININE 0.69 05/31/2021 0400   CREATININE 0.89 05/10/2021 0933   CALCIUM 8.8 (L) 05/31/2021 0400   PROT 6.3 (L) 05/18/2021 0501   ALBUMIN 2.2 (L) 05/18/2021 0501   AST 23 05/18/2021 0501   AST 20 05/10/2021 0933   ALT 26 05/18/2021 0501   ALT 21 05/10/2021 0933   ALKPHOS 44 05/18/2021 0501   BILITOT 1.0 05/18/2021 0501   BILITOT 0.6 05/10/2021 0933   GFRNONAA >60 05/31/2021 0400   GFRNONAA >60 05/10/2021 0933   Lipase  No results found for: LIPASE     Studies/Results: CT FEMUR LEFT W CONTRAST  Result Date: 05/30/2021 CLINICAL DATA:  Left lateral thigh/knee wound. Left knee soft tissue abscess incision and drainage in the ER 2 weeks ago. History of lung cancer. EXAM: CT OF THE LOWER LEFT EXTREMITY WITH CONTRAST TECHNIQUE:  Multidetector CT imaging of the left femur and knee was performed according to the standard protocol following intravenous contrast administration. RADIATION DOSE REDUCTION: This exam was performed according to the departmental dose-optimization program which includes automated exposure control, adjustment of the mA and/or kV according to patient size and/or use of iterative reconstruction technique. CONTRAST:  132mL OMNIPAQUE IOHEXOL 300 MG/ML  SOLN COMPARISON:  Left knee x-rays dated May 17, 2021. CT abdomen pelvis dated April 07, 2021. FINDINGS: Bones/Joint/Cartilage Lytic lesions in the left pubic rami and ischial tuberosity again noted with subacute pathologic fracture of the left superior pubic ramus and parasymphyseal pubic bone, similar to prior. Mild left hip osteoarthritis. No hip or knee joint effusion. Ligaments Ligaments are suboptimally evaluated by CT. Muscles and Tendons Grossly intact. Soft tissue Superficial 2.8 x 3.9 x 3.8 cm rim enhancing fluid collection in the posterolateral left hip with overlying prominent skin thickening (series 6, image 72). Slightly more superiorly and deep along the superficial fascia, there is a 2.1 x 1.1 x 2.1 cm rim enhancing fluid collection (series 6, image 63). Superficial 5.5 x 1.1 x 4.5 cm rim enhancing fluid collection in the lateral knee draining to the skin surface (series 6, image 331). Superficial 1.7 x 1.0 x 2.5 cm rim enhancing  fluid collection in the lateral proximal lower leg (series 6, image 363). Prominent skin thickening overlying both fluid collections. No soft tissue mass. IMPRESSION: 1. Three superficial abscesses in the left hip, lateral knee, and proximal lower leg as described above. 2. Smaller 2.1 cm deep abscess along the superficial fascia overlying the left greater trochanter. 3. Unchanged left hemipelvis osseous metastatic disease with pathologic fracture of the left superior pubic ramus. Electronically Signed   By: Titus Dubin M.D.    On: 05/30/2021 12:30   CT KNEE LEFT W CONTRAST  Result Date: 05/30/2021 CLINICAL DATA:  Left lateral thigh/knee wound. Left knee soft tissue abscess incision and drainage in the ER 2 weeks ago. History of lung cancer. EXAM: CT OF THE LOWER LEFT EXTREMITY WITH CONTRAST TECHNIQUE: Multidetector CT imaging of the left femur and knee was performed according to the standard protocol following intravenous contrast administration. RADIATION DOSE REDUCTION: This exam was performed according to the departmental dose-optimization program which includes automated exposure control, adjustment of the mA and/or kV according to patient size and/or use of iterative reconstruction technique. CONTRAST:  136mL OMNIPAQUE IOHEXOL 300 MG/ML  SOLN COMPARISON:  Left knee x-rays dated May 17, 2021. CT abdomen pelvis dated April 07, 2021. FINDINGS: Bones/Joint/Cartilage Lytic lesions in the left pubic rami and ischial tuberosity again noted with subacute pathologic fracture of the left superior pubic ramus and parasymphyseal pubic bone, similar to prior. Mild left hip osteoarthritis. No hip or knee joint effusion. Ligaments Ligaments are suboptimally evaluated by CT. Muscles and Tendons Grossly intact. Soft tissue Superficial 2.8 x 3.9 x 3.8 cm rim enhancing fluid collection in the posterolateral left hip with overlying prominent skin thickening (series 6, image 72). Slightly more superiorly and deep along the superficial fascia, there is a 2.1 x 1.1 x 2.1 cm rim enhancing fluid collection (series 6, image 63). Superficial 5.5 x 1.1 x 4.5 cm rim enhancing fluid collection in the lateral knee draining to the skin surface (series 6, image 331). Superficial 1.7 x 1.0 x 2.5 cm rim enhancing fluid collection in the lateral proximal lower leg (series 6, image 363). Prominent skin thickening overlying both fluid collections. No soft tissue mass. IMPRESSION: 1. Three superficial abscesses in the left hip, lateral knee, and proximal lower leg as  described above. 2. Smaller 2.1 cm deep abscess along the superficial fascia overlying the left greater trochanter. 3. Unchanged left hemipelvis osseous metastatic disease with pathologic fracture of the left superior pubic ramus. Electronically Signed   By: Titus Dubin M.D.   On: 05/30/2021 12:30    Anti-infectives: Anti-infectives (From admission, onward)    Start     Dose/Rate Route Frequency Ordered Stop   05/29/21 1415  cefadroxil (DURICEF) capsule 1,000 mg        1,000 mg Oral 2 times daily 05/29/21 1322     05/21/21 1400  ceFAZolin (ANCEF) IVPB 2g/100 mL premix  Status:  Discontinued        2 g 200 mL/hr over 30 Minutes Intravenous Every 8 hours 05/21/21 0945 05/29/21 1322   05/19/21 1800  vancomycin (VANCOREADY) IVPB 1500 mg/300 mL  Status:  Discontinued        1,500 mg 150 mL/hr over 120 Minutes Intravenous Every 24 hours 05/19/21 0718 05/21/21 0944   05/18/21 1800  vancomycin (VANCOREADY) IVPB 1250 mg/250 mL  Status:  Discontinued        1,250 mg 166.7 mL/hr over 90 Minutes Intravenous Every 24 hours 05/17/21 1842 05/19/21 0718   05/18/21  1300  cefTRIAXone (ROCEPHIN) 2 g in sodium chloride 0.9 % 100 mL IVPB  Status:  Discontinued        2 g 200 mL/hr over 30 Minutes Intravenous Every 24 hours 05/17/21 1847 05/21/21 0944   05/17/21 1300  vancomycin (VANCOREADY) IVPB 1500 mg/300 mL        1,500 mg 150 mL/hr over 120 Minutes Intravenous  Once 05/17/21 1233 05/17/21 1907   05/17/21 1215  vancomycin (VANCOCIN) IVPB 1000 mg/200 mL premix  Status:  Discontinued        1,000 mg 200 mL/hr over 60 Minutes Intravenous  Once 05/17/21 1213 05/17/21 1233   05/17/21 1215  cefTRIAXone (ROCEPHIN) 2 g in sodium chloride 0.9 % 100 mL IVPB        2 g 200 mL/hr over 30 Minutes Intravenous  Once 05/17/21 1213 05/17/21 1422        Assessment/Plan L lateral thigh lesion/abscess -sister on the phone today.  She actually states he has had this area on his hip for years and she has been asking  him to have it looked at and addressed. -he is agreeable to move forward with surgery today to excise this and send for pathology and culture. -procedure discussed along with risks and complications mainly being delayed wound healing.  He is agreeable to move forward   FEN - NPO p MN VTE - on hold due to pancytopenia ID - per ID   E. Coli bacteremia Multiple nonhealing wounds HTN Lung cancer  I reviewed hospitalist notes, last 24 h vitals and pain scores, last 48 h intake and output, last 24 h labs and trends, and last 24 h imaging results.   LOS: 14 days    Henreitta Cea , [facility] Surgery 05/31/2021, 11:19 AM Please see Amion for pager number during day hours 7:00am-4:30pm or 7:00am -11:30am on weekends

## 2021-05-31 NOTE — Anesthesia Procedure Notes (Signed)
Procedure Name: LMA Insertion Date/Time: 05/31/2021 1:21 PM Performed by: Lind Covert, CRNA Pre-anesthesia Checklist: Patient identified, Emergency Drugs available, Suction available, Patient being monitored and Timeout performed Patient Re-evaluated:Patient Re-evaluated prior to induction Oxygen Delivery Method: Circle system utilized Preoxygenation: Pre-oxygenation with 100% oxygen Induction Type: IV induction LMA: LMA inserted LMA Size: 4.0 Tube type: Oral Number of attempts: 1 Placement Confirmation: positive ETCO2 and breath sounds checked- equal and bilateral Tube secured with: Tape Dental Injury: Teeth and Oropharynx as per pre-operative assessment

## 2021-05-31 NOTE — Assessment & Plan Note (Signed)
Per oncology °

## 2021-05-31 NOTE — Op Note (Signed)
05/31/2021  2:07 PM  PATIENT:  Adam [name]  66 y.o. male  Patient Care Team: [place], Blondell Reveal, MD as PCP - General (Family Medicine) [name], RN (Inactive) as Registered Nurse Curt Bears, MD as Consulting Physician (Oncology)  PRE-OPERATIVE DIAGNOSIS:  Left Hip Mass  POST-OPERATIVE DIAGNOSIS:  Soft tissue mass of Left Hip  PROCEDURE: Excision of mass of subcutaneous tissues of left hip  SURGEON:  Adin Hector, MD  ASSISTANT: OR Staff   ANESTHESIA:   local and general  EBL:  No intake/output data recorded.  Delay start of Pharmacological VTE agent (>24hrs) due to surgical blood loss or risk of bleeding:  no  DRAINS: none   SPECIMEN: Left hip mass  DISPOSITION OF SPECIMEN:   Central necrotic fluid sent for culture. Main mass sent for pathology to rule out metastatic disease, pachydermia gangrenosum, worsening vasculitis, etc.  COUNTS:  YES  PLAN OF CARE: Admit to inpatient   PATIENT DISPOSITION:  PACU - hemodynamically stable.  INDICATION: Patient with history of metastatic lung cancer.  Has had worsening subcutaneous nodules on left arm, right elbow, left knee, and left lateral hip.  Some unroofed and drained.  Growing E. coli.  Infectious ease consultation made.  Persistent mass on left lateral help.  Orthopedics did not feel comfortable managing/general surgery.  I recommended excisional biopsy and probable culture.  The anatomy and physiology of skin abscesses was discussed. Pathophysiology of SQ abscess, possible progression to fasciitis & sepsis, etc discussed . I stressed good hygiene & wound care. Possible redebridement was discussed as well.   Possibility of recurrence was discussed. Risks, benefits, alternatives were discussed. I noted a good likelihood this will help address the problem. Risks of anesthesia and other risks discussed. Questions answered. The patient is does wish to proceed.   OR FINDINGS:   Ellipsoid mass of the left  lateral proximal thigh just distal to the left hip trochanter.  10 x 6 x 6 cm region.  Densely involving skin and dermis with subcutaneous tissues with some focal necrosis.  Complete excision done.   Specimen opened up on back table with light brown creamy center concerning for chronic sebaceous cyst or abscess.  A aerobic/anaerobic culture was done.  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  Wound measures 12cm by 8cm.  It is 4cm deep  CASE DATA:  Type of patient?: LDOW CASE (Surgical Hospitalist [facility] Inpatient)  Status of Case? URGENT Add On  Infection Present At Time Of Surgery (PATOS)?  ABSCESS  DESCRIPTION:   Informed consent was confirmed. The patient received IV antibiotics. The patient underwent general anesthesia without any difficulty. The patient was positioned supine.  SCDs were active during the entire case. The area around the abscess along the left lateral hip and thigh was prepped and draped in a sterile fashion. A surgical timeout confirmed our plan.   I made a vertical biconcave incision around the mass since it was densely adherent to the skin and dermis with central hyperpigmentation.  Did this in the sagittal plane.  Came through the dermis and down through subcutaneous tissues that were soft and less inflamed.  Came down to the deep subcutaneous tissues almost to the level of the quadriceps fascia.  I did meticulous hemostasis.  The fascia was viable.    The wound was packed with half of a 6" Kerlix antibiotic-soaked rolled gauze.  Sterile dressings applied.  Patient is being  extubated go to recovery room.   We plan to continue antibiotics per ID and begin wound care training tomorrow.     I made an attempt to locate & reach the desired patient contact to discuss the patient's overall status and my recommendations.  No one is available at this time.  Patient's contact, Sister [name], did not answer: X2    We will ry and follow-up later my plans & instructions have been written in the chart.   Adin Hector, M.D., F.A.C.S. Gastrointestinal and Minimally Invasive Surgery [facility] [address] [place], [place] [address] [phone] Main / Paging

## 2021-05-31 NOTE — Plan of Care (Signed)
  Problem: Coping: Goal: Level of anxiety will decrease Outcome: Not Progressing   Problem: Safety: Goal: Ability to remain free from injury will improve Outcome: Progressing

## 2021-05-31 NOTE — Interval H&P Note (Signed)
History and Physical Interval Note:  05/31/2021 12:54 PM  Adam [name]  has presented today for surgery, with the diagnosis of Left Hip Mass.  The various methods of treatment have been discussed with the patient and family. After consideration of risks, benefits and other options for treatment, the patient has consented to  Procedure(s): Left Hip Exicision (Left) as a surgical intervention.  The patient's history has been reviewed, patient examined, no change in status, stable for surgery.  I have reviewed the patient's chart and labs.  Questions were answered to the patient's satisfaction.    I have re-reviewed the the patient's records, history, medications, and allergies.  I have re-examined the patient.  I again discussed intraoperative plans and goals of post-operative recovery.  The patient agrees to proceed.  Adam [name]  22-Jan-1955 [id number]  Patient Care Team: [place], Blondell Reveal, MD as PCP - General (Family Medicine) [name], RN (Inactive) as Registered Nurse Curt Bears, MD as Consulting Physician (Oncology)  Patient Active Problem List   Diagnosis Date Noted   Ecthyma gangrenosum    Gram negative septicemia ([place])    Thrombocytopenia ([place]) 05/19/2021   Severe neutropenia ([place]) 05/19/2021   E coli bacteremia 05/18/2021   Cellulitis of left forearm 05/17/2021   Cellulitis of left knee 05/17/2021   Pancytopenia ([place]) 05/17/2021   Port-A-Cath in place 05/09/2021   Goals of care, counseling/discussion 02/22/2021   Metastatic lung cancer (metastasis from lung to other site), right ([place]) 12/08/2020   Malignant 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 ([place]) 12/08/2020   Inguinal hernia 11/15/2020   Lipoma 11/15/2020   Screening for colon cancer 11/15/2020   Encounter for antineoplastic chemotherapy 04/25/2020   Chemotherapy-induced neutropenia ([place]) 03/29/2020   Small cell lung cancer, 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[place]) 02/10/2020   Hemoptysis 02/06/2020   Primary cancer of right  upper lobe of lung ([place]) 02/06/2020   HTN (hypertension) 02/06/2020    Past Medical History:  Diagnosis Date   Hypertension    Lung cancer ([place])    small cell RUL    Past Surgical History:  Procedure Laterality Date   BRONCHIAL BRUSHINGS  02/08/2020   Procedure: BRONCHIAL BRUSHINGS;  Surgeon: Freddi Starr, MD;  Location: [facility] ENDOSCOPY;  Service: Pulmonary;;   BRONCHIAL NEEDLE ASPIRATION BIOPSY  02/08/2020   Procedure: BRONCHIAL NEEDLE ASPIRATION BIOPSIES;  Surgeon: Freddi Starr, MD;  Location: [place];  Service: Pulmonary;;   IR IMAGING GUIDED PORT INSERTION  12/30/2020   IR REMOVAL TUN ACCESS W/ PORT W/O FL MOD SED  05/26/2021   KNEE SURGERY Right    SHOULDER SURGERY Left    VIDEO BRONCHOSCOPY WITH ENDOBRONCHIAL ULTRASOUND N/A 02/08/2020   Procedure: VIDEO BRONCHOSCOPY WITH ENDOBRONCHIAL ULTRASOUND;  Surgeon: Freddi Starr, MD;  Location: [facility] ENDOSCOPY;  Service: Pulmonary;  Laterality: N/A;    Social History   Socioeconomic History   Marital status: Divorced    Spouse name: Adam [name]   Number of children: Not on file   Years of education: Not on file   Highest education level: Not on file  Occupational History   Not on file  Tobacco Use   Smoking status: Former    Packs/day: 1.00    Years: 20.00    Pack years: 20.00    Types: Cigarettes    Quit date: 06/09/2018    Years since quitting: 2.9   Smokeless tobacco: Never  Vaping Use   Vaping Use: Never used  Substance and Sexual Activity   Alcohol use: Not  Currently   Drug use: Never   Sexual activity: Yes  Other Topics Concern   Not on file  Social History Narrative   Not on file   Social Determinants of Health   Financial Resource Strain: Not on file  Food Insecurity: Not on file  Transportation Needs: Not on file  Physical Activity: Not on file  Stress: Not on file  Social Connections: Not on file  Intimate Partner Violence: Not on file    Family History  Problem Relation Age of Onset    Heart disease Mother    Cancer Cousin    Breast cancer Neg Hx    Colon cancer Neg Hx    Pancreatic cancer Neg Hx    Prostate cancer Neg Hx     Medications Prior to Admission  Medication Sig Dispense Refill Last Dose   acetaminophen (TYLENOL) 650 MG CR tablet Take 650 mg by mouth every 8 (eight) hours as needed for pain.   unk   amLODipine (NORVASC) 10 MG tablet Take 10 mg by mouth daily.   05/16/2021   aspirin 81 MG chewable tablet Chew 81 mg by mouth daily.   05/15/2021 at 1000   bisacodyl (DULCOLAX) 5 MG EC tablet Take 5 mg by mouth daily as needed for moderate constipation.   unk   lidocaine-prilocaine (EMLA) cream Apply 1 application topically as needed. (Patient taking differently: Apply 1 application. topically as needed (to access port).) 30 g 2 unk   lisinopril-hydrochlorothiazide (ZESTORETIC) 20-12.5 MG tablet Take 1 tablet by mouth daily. 90 tablet 1 unk   methylPREDNISolone (MEDROL DOSEPAK) 4 MG TBPK tablet Use as instructed (Patient not taking: Reported on 05/17/2021) 21 tablet 0 Not Taking   mirtazapine (REMERON) 15 MG tablet Take 2 tablets (30 mg total) by mouth at bedtime. (Patient not taking: Reported on 05/17/2021) 30 tablet 2 Not Taking   nystatin (MYCOSTATIN) 100000 UNIT/ML suspension Take 5 mLs (500,000 Units total) by mouth 4 (four) times daily. (Patient not taking: Reported on 05/17/2021) 200 mL 0 Not Taking   potassium chloride SA (KLOR-CON M) 20 MEQ tablet Take 1 tablet (20 mEq total) by mouth 2 (two) times daily. (Patient not taking: Reported on 05/17/2021) 14 tablet 0 Not Taking    Current Facility-Administered Medications  Medication Dose Route Frequency Provider Last Rate Last Admin   [MAR Hold] 0.9 %  sodium chloride infusion (Manually program via Guardrails IV Fluids)   Intravenous Once Regalado, Belkys A, MD       [MAR Hold] 0.9 %  sodium chloride infusion (Manually program via Guardrails IV Fluids)   Intravenous Once Maryanna Shape, NP       [MAR Hold]  acetaminophen (TYLENOL) tablet 650 mg  650 mg Oral Q6H PRN Marylyn Ishihara, Tyrone A, DO   650 mg at 05/26/21 1732   [MAR Hold] cefadroxil (DURICEF) capsule 1,000 mg  1,000 mg Oral BID Vu, Trung T, MD   1,000 mg at 05/31/21 1251   ceFAZolin (ANCEF) IVPB 2g/100 mL premix  2 g Intravenous On Call to OR Michael Boston, MD       Doug Sou Hold] Chlorhexidine Gluconate Cloth 2 % PADS 6 each  6 each Topical Daily Regalado, Belkys A, MD   6 each at 05/30/21 1030   [MAR Hold] Chlorhexidine Gluconate Cloth 2 % PADS 6 each  6 each Topical Once Michael Boston, MD       Doug Sou Hold] collagenase (SANTYL) ointment   Topical Daily Reita Chard       [  MAR Hold] feeding supplement (ENSURE ENLIVE / ENSURE PLUS) liquid 237 mL  237 mL Oral TID BM Regalado, Belkys A, MD   237 mL at 05/30/21 2043   [MAR Hold] HYDROcodone-acetaminophen (NORCO/VICODIN) 5-325 MG per tablet 1-2 tablet  1-2 tablet Oral Q4H PRN Marylyn Ishihara, Tyrone A, DO       lidocaine-EPINEPHrine (PF) (XYLOCAINE-EPINEPHrine) 1 %-1:200000 (PF) injection    PRN Markus Daft, MD   10 mL at 05/26/21 1627   [MAR Hold] sodium chloride flush (NS) 0.9 % injection 10-40 mL  10-40 mL Intracatheter PRN Marylyn Ishihara, Tyrone A, DO   10 mL at 05/20/21 0442     No Known Allergies  BP 116/83   Pulse 89   Temp 97.9 F (36.6 C) (Oral)   Resp 20   Ht 5\' 9"  (1.753 m)   Wt 76.1 kg   SpO2 97%   BMI 24.78 kg/m   Labs: Results for orders placed or performed during the hospital encounter of 05/17/21 (from the past 48 hour(s))  CBC with Differential/Platelet     Status: Abnormal   Collection Time: 05/30/21  3:47 AM  Result Value Ref Range   WBC 7.3 4.0 - 10.5 K/uL   RBC 2.94 (L) 4.22 - 5.81 MIL/uL   Hemoglobin 8.5 (L) 13.0 - 17.0 g/dL   HCT 26.5 (L) 39.0 - 52.0 %   MCV 90.1 80.0 - 100.0 fL   MCH 28.9 26.0 - 34.0 pg   MCHC 32.1 30.0 - 36.0 g/dL   RDW 15.6 (H) 11.5 - 15.5 %   Platelets 51 (L) 150 - 400 K/uL    Comment: Immature Platelet Fraction may be clinically indicated,  consider ordering this additional test [id number] CONSISTENT WITH PREVIOUS RESULT REPEATED TO VERIFY    nRBC 1.0 (H) 0.0 - 0.2 %   Neutrophils Relative % 61 %   Neutro Abs 4.5 1.7 - 7.7 K/uL   Lymphocytes Relative 9 %   Lymphs Abs 0.7 0.7 - 4.0 K/uL   Monocytes Relative 23 %   Monocytes Absolute 1.7 (H) 0.1 - 1.0 K/uL   Eosinophils Relative 0 %   Eosinophils Absolute 0.0 0.0 - 0.5 K/uL   Basophils Relative 1 %   Basophils Absolute 0.1 0.0 - 0.1 K/uL   WBC Morphology DOHLE BODIES     Comment: MILD LEFT SHIFT (1-5% METAS, OCC MYELO, OCC BANDS) TOXIC GRANULATION    Immature Granulocytes 6 %   Abs Immature Granulocytes 0.46 (H) 0.00 - 0.07 K/uL   Tear Drop Cells PRESENT    Burr Cells PRESENT    Polychromasia PRESENT     Comment: Performed at [facility], [place] [address], [place], [place] [address]  Basic metabolic panel     Status: Abnormal   Collection Time: 05/30/21  3:47 AM  Result Value Ref Range   Sodium 138 135 - 145 mmol/L   Potassium 4.0 3.5 - 5.1 mmol/L   Chloride 107 98 - 111 mmol/L   CO2 26 22 - 32 mmol/L   Glucose, Bld 102 (H) 70 - 99 mg/dL    Comment: Glucose reference range applies only to samples taken after fasting for at least 8 hours.   BUN 13 8 - 23 mg/dL   Creatinine, Ser 0.72 0.61 - 1.24 mg/dL   Calcium 8.6 (L) 8.9 - 10.3 mg/dL   GFR, Estimated >60 >60 mL/min    Comment: (NOTE) Calculated using the CKD-EPI Creatinine Equation (2021)    Anion gap 5 5 - 15  Comment: Performed at [facility], [place] [address] [place], [place] [address]  CBC with Differential/Platelet     Status: Abnormal   Collection Time: 05/31/21  4:00 AM  Result Value Ref Range   WBC 5.7 4.0 - 10.5 K/uL   RBC 3.14 (L) 4.22 - 5.81 MIL/uL   Hemoglobin 9.0 (L) 13.0 - 17.0 g/dL   HCT 28.3 (L) 39.0 - 52.0 %   MCV 90.1 80.0 - 100.0 fL   MCH 28.7 26.0 - 34.0 pg   MCHC 31.8 30.0 - 36.0 g/dL   RDW 15.9 (H) 11.5 - 15.5 %   Platelets 72 (L) 150 - 400  K/uL    Comment: SPECIMEN CHECKED FOR CLOTS Immature Platelet Fraction may be clinically indicated, consider ordering this additional test [id number] REPEATED TO VERIFY PLATELET COUNT CONFIRMED BY SMEAR    nRBC 0.9 (H) 0.0 - 0.2 %   Neutrophils Relative % 57 %   Neutro Abs 3.3 1.7 - 7.7 K/uL   Lymphocytes Relative 10 %   Lymphs Abs 0.6 (L) 0.7 - 4.0 K/uL   Monocytes Relative 27 %   Monocytes Absolute 1.5 (H) 0.1 - 1.0 K/uL   Eosinophils Relative 0 %   Eosinophils Absolute 0.0 0.0 - 0.5 K/uL   Basophils Relative 1 %   Basophils Absolute 0.0 0.0 - 0.1 K/uL   WBC Morphology DOHLE BODIES     Comment: MILD LEFT SHIFT (1-5% METAS, OCC MYELO, OCC BANDS)   Immature Granulocytes 5 %   Abs Immature Granulocytes 0.30 (H) 0.00 - 0.07 K/uL   Polychromasia PRESENT     Comment: Performed at [facility], [place] [address], [place],  [place] [address]  Basic metabolic panel     Status: Abnormal   Collection Time: 05/31/21  4:00 AM  Result Value Ref Range   Sodium 138 135 - 145 mmol/L   Potassium 3.9 3.5 - 5.1 mmol/L   Chloride 108 98 - 111 mmol/L   CO2 25 22 - 32 mmol/L   Glucose, Bld 102 (H) 70 - 99 mg/dL    Comment: Glucose reference range applies only to samples taken after fasting for at least 8 hours.   BUN 10 8 - 23 mg/dL   Creatinine, Ser 0.69 0.61 - 1.24 mg/dL   Calcium 8.8 (L) 8.9 - 10.3 mg/dL   GFR, Estimated >60 >60 mL/min    Comment: (NOTE) Calculated using the CKD-EPI Creatinine Equation (2021)    Anion gap 5 5 - 15    Comment: Performed at [facility], [place] [address], [place], [place] [address]    Imaging / Studies: DG Chest 2 View  Result Date: 05/17/2021 CLINICAL DATA:  Suspected sepsis.  History of lung cancer. EXAM: CHEST - 2 VIEW COMPARISON:  04/07/2021 CT FINDINGS: Right Port-A-Cath tip at superior caval/atrial junction. Numerous leads and wires project over the chest. Tracheal deviation right. Right hemidiaphragm elevation. Normal  heart size. No pleural fluid. 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 Radiation change within the right suprahilar region and paramediastinal lung. No superimposed lobar consolidation. IMPRESSION: No evidence of pneumonia. 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 Emphysema (ICD10-J43.9). Electronically Signed   By: Abigail Miyamoto M.D.   On: 05/17/2021 12:12   DG Forearm Left  Result Date: 05/17/2021 CLINICAL DATA:  Left forearm wound after fall several weeks ago. EXAM: LEFT FOREARM - 2 VIEW  COMPARISON:  None Available. FINDINGS: There is no evidence of fracture or other focal bone lesions. Large amount of soft tissue gas is seen around proximal forearm. Some high density material is noted within the soft tissues which may represent either surgical packing or other foreign body. IMPRESSION: No fracture or dislocation is noted. Large amount of soft tissue gas seen in proximal forearm consistent with laceration or other wounds. High density material is seen in these areas which may represent surgical packing or other foreign body; clinical correlation is recommended Electronically Signed   By: Marijo Conception M.D.   On: 05/17/2021 20:41   DG Knee 1-2 Views Left  Result Date: 05/17/2021 CLINICAL DATA:  Left knee wound after fall. EXAM: LEFT KNEE - 1-2 VIEW COMPARISON:  None Available. FINDINGS: No evidence of fracture, dislocation, or joint effusion. No evidence of arthropathy. Mild patellar spurring is noted. Vascular calcifications are noted. Soft tissue gas is noted lateral to the joint space consistent with laceration. IMPRESSION: Soft tissue gas noted lateral to the joint space consistent with laceration. No fracture or dislocation is noted. Electronically Signed   By: Marijo Conception M.D.   On: 05/17/2021 20:38   CT FEMUR LEFT W CONTRAST  Result Date: 05/30/2021 CLINICAL DATA:  Left  lateral thigh/knee wound. Left knee soft tissue abscess incision and drainage in the ER 2 weeks ago. History of lung cancer. EXAM: CT OF THE LOWER LEFT EXTREMITY WITH CONTRAST TECHNIQUE: Multidetector CT imaging of the left femur and knee was performed according to the standard protocol following intravenous contrast administration. RADIATION DOSE REDUCTION: This exam was performed according to the departmental dose-optimization program which includes automated exposure control, adjustment of the mA and/or kV according to patient size and/or use of iterative reconstruction technique. CONTRAST:  170mL OMNIPAQUE IOHEXOL 300 MG/ML  SOLN COMPARISON:  Left knee x-rays dated May 17, 2021. CT abdomen pelvis dated April 07, 2021. FINDINGS: Bones/Joint/Cartilage Lytic lesions in the left pubic rami and ischial tuberosity again noted with subacute pathologic fracture of the left superior pubic ramus and parasymphyseal pubic bone, similar to prior. Mild left hip osteoarthritis. No hip or knee joint effusion. Ligaments Ligaments are suboptimally evaluated by CT. Muscles and Tendons Grossly intact. Soft tissue Superficial 2.8 x 3.9 x 3.8 cm rim enhancing fluid collection in the posterolateral left hip with overlying prominent skin thickening (series 6, image 72). Slightly more superiorly and deep along the superficial fascia, there is a 2.1 x 1.1 x 2.1 cm rim enhancing fluid collection (series 6, image 63). Superficial 5.5 x 1.1 x 4.5 cm rim enhancing fluid collection in the lateral knee draining to the skin surface (series 6, image 331). Superficial 1.7 x 1.0 x 2.5 cm rim enhancing fluid collection in the lateral proximal lower leg (series 6, image 363). Prominent skin thickening overlying both fluid collections. No soft tissue mass. IMPRESSION: 1. Three superficial abscesses in the left hip, lateral knee, and proximal lower leg as described above. 2. Smaller 2.1 cm deep abscess along the superficial fascia overlying the left  greater trochanter. 3. Unchanged left hemipelvis osseous metastatic disease with pathologic fracture of the left superior pubic ramus. Electronically Signed   By: Titus Dubin M.D.   On: 05/30/2021 12:30   US RENAL  Result Date: 05/17/2021 CLINICAL DATA:  Acute renal insufficiency EXAM: RENAL / URINARY TRACT ULTRASOUND COMPLETE COMPARISON:  CT 04/07/2021 FINDINGS: Right Kidney: Renal measurements: 10.9 x 4.9 x 4.6 cm = volume: 129 mL. Echogenicity within normal limits.  No mass or hydronephrosis visualized. Left Kidney: Renal measurements: 10.1 x 5.8 x 4.8 cm = volume: 146 mL. Echogenicity within normal limits. No mass or hydronephrosis visualized. 9 mm nonobstructing calculus noted within the interpolar region of the left kidney. Bladder: Appears normal for degree of bladder distention. Bilateral ureteral jets are identified Other: None. IMPRESSION: Stable left nonobstructing nephrolithiasis. Otherwise unremarkable examination. Electronically Signed   By: Fidela Salisbury M.D.   On: 05/17/2021 19:26   CT KNEE LEFT W CONTRAST  Result Date: 05/30/2021 CLINICAL DATA:  Left lateral thigh/knee wound. Left knee soft tissue abscess incision and drainage in the ER 2 weeks ago. History of lung cancer. EXAM: CT OF THE LOWER LEFT EXTREMITY WITH CONTRAST TECHNIQUE: Multidetector CT imaging of the left femur and knee was performed according to the standard protocol following intravenous contrast administration. RADIATION DOSE REDUCTION: This exam was performed according to the departmental dose-optimization program which includes automated exposure control, adjustment of the mA and/or kV according to patient size and/or use of iterative reconstruction technique. CONTRAST:  138mL OMNIPAQUE IOHEXOL 300 MG/ML  SOLN COMPARISON:  Left knee x-rays dated May 17, 2021. CT abdomen pelvis dated April 07, 2021. FINDINGS: Bones/Joint/Cartilage Lytic lesions in the left pubic rami and ischial tuberosity again noted with subacute  pathologic fracture of the left superior pubic ramus and parasymphyseal pubic bone, similar to prior. Mild left hip osteoarthritis. No hip or knee joint effusion. Ligaments Ligaments are suboptimally evaluated by CT. Muscles and Tendons Grossly intact. Soft tissue Superficial 2.8 x 3.9 x 3.8 cm rim enhancing fluid collection in the posterolateral left hip with overlying prominent skin thickening (series 6, image 72). Slightly more superiorly and deep along the superficial fascia, there is a 2.1 x 1.1 x 2.1 cm rim enhancing fluid collection (series 6, image 63). Superficial 5.5 x 1.1 x 4.5 cm rim enhancing fluid collection in the lateral knee draining to the skin surface (series 6, image 331). Superficial 1.7 x 1.0 x 2.5 cm rim enhancing fluid collection in the lateral proximal lower leg (series 6, image 363). Prominent skin thickening overlying both fluid collections. No soft tissue mass. IMPRESSION: 1. Three superficial abscesses in the left hip, lateral knee, and proximal lower leg as described above. 2. Smaller 2.1 cm deep abscess along the superficial fascia overlying the left greater trochanter. 3. Unchanged left hemipelvis osseous metastatic disease with pathologic fracture of the left superior pubic ramus. Electronically Signed   By: Titus Dubin M.D.   On: 05/30/2021 12:30   IR REMOVAL TUN ACCESS W/ PORT W/O FL MOD SED  Result Date: 05/26/2021 INDICATION: 66 year old with lung cancer and bacteremia. Request for Port-A-Cath removal. EXAM: REMOVAL OF RIGHT CHEST PORT-A-CATH MEDICATIONS: % lidocaine ANESTHESIA/SEDATION: None FLUOROSCOPY: None COMPLICATIONS: None immediate. PROCEDURE: Informed written consent was obtained from the patient after a thorough discussion of the procedural risks, benefits and alternatives. All questions were addressed. Maximal Sterile Barrier Technique was utilized including caps, mask, sterile gowns, sterile gloves, sterile drape, hand hygiene and skin antiseptic. A timeout  was performed prior to the initiation of the procedure. The right chest was prepped and draped in a sterile fashion. Lidocaine was utilized for local anesthesia. An incision was made over the previously healed surgical incision. Utilizing blunt dissection, the port catheter and reservoir were removed from the underlying subcutaneous tissue in their entirety. The pocket was irrigated with a copious amount of sterile normal saline. The subcutaneous tissue was closed with 3-0 Vicryl interrupted subcutaneous stitches. A 4-0 Vicryl running  subcuticular stitch was utilized to approximate the skin. Dermabond was applied. FINDINGS: Normal appearance of the skin site. Port pocket looked healthy without signs of infection. IMPRESSION: Successful removal of the right chest Port-A-Cath. Electronically Signed   By: Markus Daft M.D.   On: 05/26/2021 17:30     .Adin Hector, M.D., F.A.C.S. Gastrointestinal and Minimally Invasive Surgery [facility] [address], [place], [place] [address] [phone] Main / Paging  05/31/2021 12:54 PM    Adin Hector

## 2021-05-31 NOTE — Progress Notes (Signed)
PT Cancellation Note  Patient Details Name: Adam [name] MRN: [id number] DOB: 1955/04/27   Cancelled Treatment:    Reason Eval/Treat Not Completed: Patient at procedure or test/unavailable (Patient is off unit for procedure. Pt underwent excision of mass of subcutaneous tissues of left hip. Will follow up at later date/time when pt is back from procedure and as schedule allows.)   [place], [place] Office [phone] Pager [phone]  05/31/21 2:54 PM

## 2021-05-31 NOTE — Assessment & Plan Note (Addendum)
Blood culture with E. coli bacteremia.  Patient with left forearm and left lower extremity cellulitis with abscess.  He was tachycardic with tachypnea and neutropenia although neutropenia could be due to recent chemotherapy.  Sepsis physiology resolved. -S/p I&D and packing in ED on 5/10 -S/p  I&D by orthopedic surgery on 5/18.  Culture with pansensitive E. coli -S/p excisional debridement of left hip tissue mass on 5/24.  Culture with E. Coli.  F/up pathology -ID recommends p.o. cefadroxil for 3 weeks from 5/24.  Outpatient follow-up on 06/14/2021 -VAC applied to left with excisional wound. -Patient's ex-wife, former CNA educated on wound care -Home health PT/OT/RN/RW/3 and 1/shower seat ordered. -Outpatient follow-up with general surgery and orthopedic surgery

## 2021-06-01 ENCOUNTER — Encounter (HOSPITAL_COMMUNITY): Payer: Self-pay | Admitting: Surgery

## 2021-06-01 DIAGNOSIS — R6520 Severe sepsis without septic shock: Secondary | ICD-10-CM | POA: Diagnosis not present

## 2021-06-01 DIAGNOSIS — L03114 Cellulitis of left upper limb: Secondary | ICD-10-CM | POA: Diagnosis not present

## 2021-06-01 DIAGNOSIS — N179 Acute kidney failure, unspecified: Secondary | ICD-10-CM | POA: Diagnosis not present

## 2021-06-01 DIAGNOSIS — L03116 Cellulitis of left lower limb: Secondary | ICD-10-CM | POA: Diagnosis not present

## 2021-06-01 DIAGNOSIS — L080 Pyoderma: Secondary | ICD-10-CM | POA: Diagnosis not present

## 2021-06-01 DIAGNOSIS — A4151 Sepsis due to Escherichia coli [E. coli]: Secondary | ICD-10-CM | POA: Diagnosis not present

## 2021-06-01 DIAGNOSIS — B9620 Unspecified Escherichia coli [E. coli] as the cause of diseases classified elsewhere: Secondary | ICD-10-CM | POA: Diagnosis not present

## 2021-06-01 LAB — CBC WITH DIFFERENTIAL/PLATELET
Abs Immature Granulocytes: 0.16 10*3/uL — ABNORMAL HIGH (ref 0.00–0.07)
Basophils Absolute: 0 10*3/uL (ref 0.0–0.1)
Basophils Relative: 0 %
Eosinophils Absolute: 0 10*3/uL (ref 0.0–0.5)
Eosinophils Relative: 0 %
HCT: 28.8 % — ABNORMAL LOW (ref 39.0–52.0)
Hemoglobin: 9.1 g/dL — ABNORMAL LOW (ref 13.0–17.0)
Immature Granulocytes: 3 %
Lymphocytes Relative: 9 %
Lymphs Abs: 0.5 10*3/uL — ABNORMAL LOW (ref 0.7–4.0)
MCH: 28.7 pg (ref 26.0–34.0)
MCHC: 31.6 g/dL (ref 30.0–36.0)
MCV: 90.9 fL (ref 80.0–100.0)
Monocytes Absolute: 1.3 10*3/uL — ABNORMAL HIGH (ref 0.1–1.0)
Monocytes Relative: 20 %
Neutro Abs: 4.3 10*3/uL (ref 1.7–7.7)
Neutrophils Relative %: 68 %
Platelets: 100 10*3/uL — ABNORMAL LOW (ref 150–400)
RBC: 3.17 MIL/uL — ABNORMAL LOW (ref 4.22–5.81)
RDW: 15.8 % — ABNORMAL HIGH (ref 11.5–15.5)
WBC: 6.3 10*3/uL (ref 4.0–10.5)
nRBC: 0.3 % — ABNORMAL HIGH (ref 0.0–0.2)

## 2021-06-01 NOTE — Assessment & Plan Note (Addendum)
Likely related to chemo.  Leukopenia resolved although he still has some lymphocytopenia.  Anemia stable.  Thrombocytopenia improved. -Check CBC at follow-up

## 2021-06-01 NOTE — Plan of Care (Signed)
  Problem: Education: Goal: Knowledge of General Education information will improve Description: Including pain rating scale, medication(s)/side effects and non-pharmacologic comfort measures Outcome: Progressing   Problem: Activity: Goal: Risk for activity intolerance will decrease Outcome: Progressing   Problem: Nutrition: Goal: Adequate nutrition will be maintained Outcome: Progressing   Problem: Coping: Goal: Level of anxiety will decrease Outcome: Progressing   

## 2021-06-01 NOTE — Progress Notes (Signed)
Physical Therapy Treatment Patient Details Name: Adam [name] MRN: [id number] DOB: [birth year] Today's Date: 06/01/2021   History of Present Illness 66 y.o. male with medical history significant of metastatic small cell lung cancer, HTN. Presenting with multiple falls over the last week. Today presented to his [facility] clinic for regular follow up and with concerns of left knee and left forearm after falls at home. S/P I&D 5/10, repeat I&D 5/18, and port-a-cath removal 5/19.    PT Comments    Pt is progressing well with therapy. He demonstrated significant improvement with 5xSit<>Stand test and completed this in ~22 second compared to ~55 seconds previously. This is likely due to improved cognition, however pt continues to have ongoing cognitive impairments. He continues to require MIN assist and external support of single point cane with gait, transfers, and ADL's. Pt wants to go home, time spent discussing concerns for pt's ability to properly care for his wounds. Pt feels his ex will be able to provide care, unable to confirm with pt's ex. He will continue to benefit from skilled acute physical therapy and follow up HHPT. Pt will need to have his family member trained in wound management and likely [facility] for wound care.    Recommendations for follow up therapy are one component of a multi-disciplinary discharge planning process, led by the attending physician.  Recommendations may be updated based on patient status, additional functional criteria and insurance authorization.  Follow Up Recommendations  Home health PT     Assistance Recommended at Discharge Frequent or constant Supervision/Assistance  Patient can return home with the following A little help with walking and/or transfers;A little help with bathing/dressing/bathroom;Assistance with cooking/housework;Direct supervision/assist for medications management;Direct supervision/assist for financial management;Assist for transportation;Help with  stairs or ramp for entrance   Equipment Recommendations  Rolling walker (2 wheels);BSC/3in1    Recommendations for Other Services       Precautions / Restrictions Precautions Precautions: Fall;Other (comment) Precaution Comments: Wounds-1 in LUE and 2 in LLE Restrictions Weight Bearing Restrictions: No     Mobility  Bed Mobility Overal bed mobility: Needs Assistance Bed Mobility: Supine to Sit     Supine to sit: Min guard, HOB elevated     General bed mobility comments: Pt able to bring legs off edge of bed, pt used bedrail to pull up trunk and bilateral UE to scoot to edge and place feet on floor.    Transfers Overall transfer level: Needs assistance Equipment used: Straight cane Transfers: Sit to/from Stand Sit to Stand: Min guard           General transfer comment: One hand pushed from EOB, other hand stabilizing SPC. Min guard for safety, pt steady once standing.    Ambulation/Gait Ambulation/Gait assistance: Min guard, Min assist Gait Distance (Feet): 200 Feet Assistive device: Straight cane Gait Pattern/deviations: Decreased stride length, Shuffle, Trunk flexed, Decreased dorsiflexion - right, Decreased dorsiflexion - left, Decreased step length - left, Step-through pattern, Decreased step length - right Gait velocity: Decreased     General Gait Details: Pt amb with SPC, no LOB, pt drifting right but able to self correct. Pt steady while doing 180 turns. overall low foot clearance, likely due to hip flexor weakness   Stairs             Wheelchair Mobility    Modified Rankin (Stroke Patients Only)       Balance Overall balance assessment: Needs assistance Sitting-balance support: Feet supported, No upper extremity supported Sitting balance-Leahy Scale: Good Sitting  balance - Comments: Pt donned slippers in sitting with supervision   Standing balance support: Single extremity supported, Reliant on assistive device for balance, During  functional activity Standing balance-Leahy Scale: Fair                              Cognition Arousal/Alertness: Awake/alert Behavior During Therapy: Flat affect Overall Cognitive Status: No family/caregiver present to determine baseline cognitive functioning Area of Impairment: Safety/judgement, Following commands, Attention, Awareness, Problem solving                   Current Attention Level: Selective   Following Commands: Follows one step commands with increased time, Follows multi-step commands inconsistently, Follows multi-step commands with increased time, Follows one step commands consistently Safety/Judgement: Decreased awareness of deficits Awareness: Emergent Problem Solving: Decreased initiation, Requires verbal cues, Requires tactile cues, Slow processing          Exercises      General Comments General comments (skin integrity, edema, etc.): 5x sit to stand: 22 seconds, no UE use for powerup (55 sec on 5/22)      Pertinent Vitals/Pain Pain Assessment Pain Assessment: No/denies pain Pain Intervention(s): Monitored during session    Home Living Family/patient expects to be discharged to:: Private residence Living Arrangements: Spouse/significant other Available Help at Discharge: Friend(s) Type of Home: Apartment           Home Equipment: Kasandra Knudsen - single point      Prior Function            PT Goals (current goals can now be found in the care plan section) Acute Rehab PT Goals PT Goal Formulation: With patient Time For Goal Achievement: 06/08/21 (extended) Potential to Achieve Goals: Good Progress towards PT goals: Progressing toward goals    Frequency    Min 3X/week      PT Plan Current plan remains appropriate    Co-evaluation              AM-PAC PT "6 Clicks" Mobility   Outcome Measure  Help needed turning from your back to your side while in a flat bed without using bedrails?: None Help needed moving from  lying on your back to sitting on the side of a flat bed without using bedrails?: A Little Help needed moving to and from a bed to a chair (including a wheelchair)?: A Little Help needed standing up from a chair using your arms (e.g., wheelchair or bedside chair)?: A Little Help needed to walk in hospital room?: A Little Help needed climbing 3-5 steps with a railing? : A Little 6 Click Score: 19    End of Session Equipment Utilized During Treatment: Gait belt Activity Tolerance: Patient tolerated treatment well Patient left: in chair;with call bell/phone within reach;with chair alarm set Nurse Communication: Mobility status PT Visit Diagnosis: Muscle weakness (generalized) (M62.81);Unsteadiness on feet (R26.81);Repeated falls (R29.6)     Time: 1610-9604 PT Time Calculation (min) (ACUTE ONLY): 22 min  Charges:  $Gait Training: 8-22 mins                     Margie Ege, SPT [place] 06/01/2021, 5:08 PM

## 2021-06-01 NOTE — TOC Progression Note (Addendum)
Transition of Care [facility]) - Progression Note    Patient Details  Name: KAHIL AGNER MRN: [id number] Date of Birth: 04/14/1955  [facility] CM/SW Contact  Leeroy Cha, RN Phone Number: 06/01/2021, 9:00 AM  Clinical Narrative:    Dme ordered sent to [employer] for fulfillment. [facility]hc called into interim wcb [facility]-unable to take insurance. [facility] smith with [facility]. [facility] can take patient for hhc. Tct-[facility]/will gop ahead and pull wound vac for delivery.  Form for wound vac to be filled out and signed sent to [employer] via email. Expected Discharge Plan: [place] Barriers to Discharge: No Barriers Identified  Expected Discharge Plan and Services Expected Discharge Plan: [place] arrangements for the past 2 months: Single Family Home                 DME Arranged: Shower stool, Walker rolling, 3-N-1 DME Agency: [employer] Date DME Agency Contacted: 06/01/21   Representative spoke with at DME Agency: danielle             Social Determinants of Health (SDOH) Interventions    Readmission Risk Interventions     View : No data to display.

## 2021-06-01 NOTE — Progress Notes (Addendum)
1 Day Post-Op  Subjective: No new complaints.  Actually looks well and feels well today.  No issues with left hip since surgery.  ROS: See above, otherwise other systems negative  Objective: Vital signs in last 24 hours: Temp:  [97.5 F (36.4 C)-98 F (36.7 C)] 97.7 F (36.5 C) (05/25 0824) Pulse Rate:  [89-104] 100 (05/25 0824) Resp:  [16-20] 20 (05/25 0824) BP: (101-126)/(67-85) 126/85 (05/25 0824) SpO2:  [94 %-100 %] 100 % (05/25 0824) Weight:  [76.1 kg] 76.1 kg (05/24 1217) Last BM Date : 05/30/21  Intake/Output from previous day: 05/24 0701 - 05/25 0700 In: 237 [P.O.:237] Out: 650 [Urine:650] Intake/Output this shift: No intake/output data recorded.  PE: Skin: left hip wound is clean and packed.  No purulent drainage or necrotic tissue present  Lab Results:  Recent Labs    05/31/21 0400 06/01/21 0415  WBC 5.7 6.3  HGB 9.0* 9.1*  HCT 28.3* 28.8*  PLT 72* 100*   BMET Recent Labs    05/31/21 0400 05/31/21 2119  NA 138 135  K 3.9 4.4  CL 108 105  CO2 25 25  GLUCOSE 102* 159*  BUN 10 12  CREATININE 0.69 0.81  CALCIUM 8.8* 9.1   PT/INR No results for input(s): LABPROT, INR in the last 72 hours. CMP     Component Value Date/Time   NA 135 05/31/2021 2119   K 4.4 05/31/2021 2119   CL 105 05/31/2021 2119   CO2 25 05/31/2021 2119   GLUCOSE 159 (H) 05/31/2021 2119   BUN 12 05/31/2021 2119   CREATININE 0.81 05/31/2021 2119   CREATININE 0.89 05/10/2021 0933   CALCIUM 9.1 05/31/2021 2119   PROT 6.3 (L) 05/18/2021 0501   ALBUMIN 2.4 (L) 05/31/2021 2119   AST 23 05/18/2021 0501   AST 20 05/10/2021 0933   ALT 26 05/18/2021 0501   ALT 21 05/10/2021 0933   ALKPHOS 44 05/18/2021 0501   BILITOT 1.0 05/18/2021 0501   BILITOT 0.6 05/10/2021 0933   GFRNONAA >60 05/31/2021 2119   GFRNONAA >60 05/10/2021 0933   Lipase  No results found for: LIPASE     Studies/Results: CT FEMUR LEFT W CONTRAST  Result Date: 05/30/2021 CLINICAL DATA:  Left lateral  thigh/knee wound. Left knee soft tissue abscess incision and drainage in the ER 2 weeks ago. History of lung cancer. EXAM: CT OF THE LOWER LEFT EXTREMITY WITH CONTRAST TECHNIQUE: Multidetector CT imaging of the left femur and knee was performed according to the standard protocol following intravenous contrast administration. RADIATION DOSE REDUCTION: This exam was performed according to the departmental dose-optimization program which includes automated exposure control, adjustment of the mA and/or kV according to patient size and/or use of iterative reconstruction technique. CONTRAST:  129mL OMNIPAQUE IOHEXOL 300 MG/ML  SOLN COMPARISON:  Left knee x-rays dated May 17, 2021. CT abdomen pelvis dated April 07, 2021. FINDINGS: Bones/Joint/Cartilage Lytic lesions in the left pubic rami and ischial tuberosity again noted with subacute pathologic fracture of the left superior pubic ramus and parasymphyseal pubic bone, similar to prior. Mild left hip osteoarthritis. No hip or knee joint effusion. Ligaments Ligaments are suboptimally evaluated by CT. Muscles and Tendons Grossly intact. Soft tissue Superficial 2.8 x 3.9 x 3.8 cm rim enhancing fluid collection in the posterolateral left hip with overlying prominent skin thickening (series 6, image 72). Slightly more superiorly and deep along the superficial fascia, there is a 2.1 x 1.1 x 2.1 cm rim enhancing fluid collection (series 6, image 63).  Superficial 5.5 x 1.1 x 4.5 cm rim enhancing fluid collection in the lateral knee draining to the skin surface (series 6, image 331). Superficial 1.7 x 1.0 x 2.5 cm rim enhancing fluid collection in the lateral proximal lower leg (series 6, image 363). Prominent skin thickening overlying both fluid collections. No soft tissue mass. IMPRESSION: 1. Three superficial abscesses in the left hip, lateral knee, and proximal lower leg as described above. 2. Smaller 2.1 cm deep abscess along the superficial fascia overlying the left greater  trochanter. 3. Unchanged left hemipelvis osseous metastatic disease with pathologic fracture of the left superior pubic ramus. Electronically Signed   By: Titus Dubin M.D.   On: 05/30/2021 12:30   CT KNEE LEFT W CONTRAST  Result Date: 05/30/2021 CLINICAL DATA:  Left lateral thigh/knee wound. Left knee soft tissue abscess incision and drainage in the ER 2 weeks ago. History of lung cancer. EXAM: CT OF THE LOWER LEFT EXTREMITY WITH CONTRAST TECHNIQUE: Multidetector CT imaging of the left femur and knee was performed according to the standard protocol following intravenous contrast administration. RADIATION DOSE REDUCTION: This exam was performed according to the departmental dose-optimization program which includes automated exposure control, adjustment of the mA and/or kV according to patient size and/or use of iterative reconstruction technique. CONTRAST:  139mL OMNIPAQUE IOHEXOL 300 MG/ML  SOLN COMPARISON:  Left knee x-rays dated May 17, 2021. CT abdomen pelvis dated April 07, 2021. FINDINGS: Bones/Joint/Cartilage Lytic lesions in the left pubic rami and ischial tuberosity again noted with subacute pathologic fracture of the left superior pubic ramus and parasymphyseal pubic bone, similar to prior. Mild left hip osteoarthritis. No hip or knee joint effusion. Ligaments Ligaments are suboptimally evaluated by CT. Muscles and Tendons Grossly intact. Soft tissue Superficial 2.8 x 3.9 x 3.8 cm rim enhancing fluid collection in the posterolateral left hip with overlying prominent skin thickening (series 6, image 72). Slightly more superiorly and deep along the superficial fascia, there is a 2.1 x 1.1 x 2.1 cm rim enhancing fluid collection (series 6, image 63). Superficial 5.5 x 1.1 x 4.5 cm rim enhancing fluid collection in the lateral knee draining to the skin surface (series 6, image 331). Superficial 1.7 x 1.0 x 2.5 cm rim enhancing fluid collection in the lateral proximal lower leg (series 6, image 363).  Prominent skin thickening overlying both fluid collections. No soft tissue mass. IMPRESSION: 1. Three superficial abscesses in the left hip, lateral knee, and proximal lower leg as described above. 2. Smaller 2.1 cm deep abscess along the superficial fascia overlying the left greater trochanter. 3. Unchanged left hemipelvis osseous metastatic disease with pathologic fracture of the left superior pubic ramus. Electronically Signed   By: Titus Dubin M.D.   On: 05/30/2021 12:30    Anti-infectives: Anti-infectives (From admission, onward)    Start     Dose/Rate Route Frequency Ordered Stop   05/31/21 1245  ceFAZolin (ANCEF) IVPB 2g/100 mL premix       Note to Pharmacy: Pharmacy may adjust dosing strength, interval, or rate of medication as needed for optimal therapy for the patient  Send with patient on call to the OR.  Anesthesia to complete antibiotic administration <45min prior to incision per [facility].   2 g 200 mL/hr over 30 Minutes Intravenous On call to O.R. 05/31/21 1240 05/31/21 1336   05/29/21 1415  cefadroxil (DURICEF) capsule 1,000 mg        1,000 mg Oral 2 times daily 05/29/21 1322     05/21/21  1400  ceFAZolin (ANCEF) IVPB 2g/100 mL premix  Status:  Discontinued        2 g 200 mL/hr over 30 Minutes Intravenous Every 8 hours 05/21/21 0945 05/29/21 1322   05/19/21 1800  vancomycin (VANCOREADY) IVPB 1500 mg/300 mL  Status:  Discontinued        1,500 mg 150 mL/hr over 120 Minutes Intravenous Every 24 hours 05/19/21 0718 05/21/21 0944   05/18/21 1800  vancomycin (VANCOREADY) IVPB 1250 mg/250 mL  Status:  Discontinued        1,250 mg 166.7 mL/hr over 90 Minutes Intravenous Every 24 hours 05/17/21 1842 05/19/21 0718   05/18/21 1300  cefTRIAXone (ROCEPHIN) 2 g in sodium chloride 0.9 % 100 mL IVPB  Status:  Discontinued        2 g 200 mL/hr over 30 Minutes Intravenous Every 24 hours 05/17/21 1847 05/21/21 0944   05/17/21 1300  vancomycin (VANCOREADY) IVPB 1500 mg/300 mL         1,500 mg 150 mL/hr over 120 Minutes Intravenous  Once 05/17/21 1233 05/17/21 1907   05/17/21 1215  vancomycin (VANCOCIN) IVPB 1000 mg/200 mL premix  Status:  Discontinued        1,000 mg 200 mL/hr over 60 Minutes Intravenous  Once 05/17/21 1213 05/17/21 1233   05/17/21 1215  cefTRIAXone (ROCEPHIN) 2 g in sodium chloride 0.9 % 100 mL IVPB        2 g 200 mL/hr over 30 Minutes Intravenous  Once 05/17/21 1213 05/17/21 1422        Assessment/Plan POD 1, s/p excision of L lateral thigh lesion, Dr. Johney Maine 5/24 -path pending -cx with E coli, already on abx for this secondary to his bacteremia -wound is nice and clean.  Will plan to have a wound VAC placed today.  HH has agreed to take care of this. -wound measures 10x6x6cm -WOC consult for [facility] placement tomorrow   FEN - HH VTE - on hold due to pancytopenia ID - per ID   E. Coli bacteremia Multiple nonhealing wounds HTN Lung cancer    LOS: 15 days    Henreitta Cea , [facility] 06/01/2021, 10:26 AM Please see Amion for pager number during day hours 7:00am-4:30pm or 7:00am -11:30am on weekends

## 2021-06-01 NOTE — Progress Notes (Signed)
PROGRESS NOTE  Adam [name]:[id number] DOB: [birth year]   PCP: [place], [name], MD  Patient is from: Home.  Lives alone but recently ex-wife helping.  Uses cane at baseline.  DOA: 05/17/2021 LOS: 80  Chief complaints Chief Complaint  Patient presents with   Fall   Tachycardia   Wound Check     Brief Narrative / Interim history: 66 year old M with PMH of metastatic small cell lung cancer and HTN presenting from concentrator due to left knee and left forearm swelling, and fall at home.  He underwent bedside debridement in ER for his left forearm and knee swelling with packing.  He was started on IV antibiotics.  After admission, he was found to have E. coli bacteremia.  Infectious disease and ID consulted.  He was reevaluated by orthopedic surgery on 5/18 and underwent I&D of left proximal forearm wound.  Wound culture grew E. coli.  Port a cath removed on 5/19.  Orthopedic surgery recommended CT of his thigh and knee, that showed 3 superficial abscesses of the left hip, lateral knee and proximal lower leg.  Ortho recommended general surgery and plastic surgery consultation.  Patient underwent excision of left hip subcutaneous tissue mass on 05/31/2021.  Tissue culture with moderate E. coli.  Pathology pending.  ID recommends cefadroxil for 3 weeks from 05/31/2021 with close outpatient follow-up.  Therapy recommended home health and DME.  Plan is discharged home with home health once family member educated on wound care since he requires daily to twice daily dressing.  General surgery also planning to place wound VAC on his left hip.     Subjective: Seen and examined earlier this morning.  No major events overnight of this morning.  No complaints.  He denies pain.  He tells me his ex-wife is a former CNA who could help with wound care at home.   Objective: Vitals:   06/01/21 0528 06/01/21 0824 06/01/21 0824 06/01/21 1141  BP: 124/82 126/85 126/85 103/77  Pulse: 98 100 100 96  Resp:  20 20 20 18   Temp: (!) 97.5 F (36.4 C) 97.7 F (36.5 C) 97.7 F (36.5 C) 98 F (36.7 C)  TempSrc: Oral Oral Oral Oral  SpO2: 98% 100% 100% 99%  Weight:      Height:        Examination:  GENERAL: Appears frail.  No apparent distress. HEENT: MMM.  Vision and hearing grossly intact.  NECK: Supple.  No apparent JVD.  RESP:  No IWOB.  Fair aeration bilaterally. CVS:  RRR. Heart sounds normal.  ABD/GI/GU: BS+. Abd soft, NTND.  MSK/EXT:  Moves extremities. No apparent deformity. No edema.  SKIN: Dressing and Ace wrap over left forearm.  Dressing over left knee and left hip DCI. NEURO: Awake and alert. Oriented appropriately.  No apparent focal neuro deficit. PSYCH: Calm. Normal affect.   Procedures:  5/10-bedside I&D of left forearm and left knee 5/18-I&D of left forearm and left elbow by orthopedic surgery 5/24-excisional debridement of subcutaneous soft tissue left hip mass  Microbiology summarized: 5/10-blood culture with pansensitive E. coli. 5/12-blood cultures NGTD 5/18-abscess culture from left lateral forearm and left elbow with E. coli. 5/24-tissue culture from left thigh/hip pending.  Culture with E. coli.  Assessment and Plan: * Sepsis due to E. coli bacteremia, left forearm and left lower extremity cellulitis/abscess Blood culture with E. coli bacteremia.  Patient with left forearm and left lower extremity cellulitis with abscess.  He was tachycardic with tachypnea and neutropenia although neutropenia  could be due to recent chemotherapy.  Sepsis physiology resolved. -S/p I&D and packing in ED on 5/10 -S/p  I&D by orthopedic surgery on 5/18.  Culture with pansensitive E. coli -S/p excisional debridement of left hip tissue mass on 5/24.  Culture with E. Coli.  Follow-up pathology -ID recommends p.o. cefadroxil for 3 weeks from 5/24 -Wound care per general surgery and orthopedic surgery-requires daily dressing -Patient stated his ex-wife is a CNA who could help.  RN to  call her for education -Home health PT/OT/RN/RW/3 and 1/shower seat ordered.   Pancytopenia ([place]) Likely related to chemo.  Leukopenia resolved although he still has some lymphocytopenia.  Anemia stable.  Thrombocytopenia improved. -Continue monitoring  Severe neutropenia (H[facility]) Felt to be due to chemotherapy.  Received Granix on 5/20.  Resolved.  Primary cancer of right upper lobe of lung [facility]) Per oncology.  Thrombocytopenia ([place]) Likely due to chemotherapy.  Transfused about 3 apheresis so far.  Improving. -Transfuse for platelet count less than<20k per oncology.   AKI (acute kidney injury) [facility]) Resolved   Physical deconditioning PT/OT eval-recommended home meds and DME's.  HTN (hypertension) Normotensive off home amlodipine, lisinopril and HCTZ.    DVT prophylaxis:  SCD's Start: 05/31/21 0743 SCDs Start: 05/17/21 1848  Code Status: Full code Family Communication: Patient and/or RN. Available if any question.  Level of care: Med-Surg Status is: Inpatient Remains inpatient appropriate because: Complex wound care   Final disposition: Home with home health once medically cleared Consultants:  Infectious disease General surgery Plastic surgery Orthopedic surgery  Sch Meds:  Scheduled Meds:  sodium chloride   Intravenous Once   sodium chloride   Intravenous Once   cefadroxil  1,000 mg Oral BID   Chlorhexidine Gluconate Cloth  6 each Topical Daily   collagenase   Topical Daily   feeding supplement  237 mL Oral TID BM   Continuous Infusions: PRN Meds:.acetaminophen **OR** [DISCONTINUED] acetaminophen, sodium chloride flush  Antimicrobials: Anti-infectives (From admission, onward)    Start     Dose/Rate Route Frequency Ordered Stop   05/31/21 1245  ceFAZolin (ANCEF) IVPB 2g/100 mL premix       Note to Pharmacy: Pharmacy may adjust dosing strength, interval, or rate of medication as needed for optimal therapy for the patient  Send with patient on call to  the OR.  Anesthesia to complete antibiotic administration <35min prior to incision per [facility].   2 g 200 mL/hr over 30 Minutes Intravenous On call to O.R. 05/31/21 1240 05/31/21 1336   05/29/21 1415  cefadroxil (DURICEF) capsule 1,000 mg        1,000 mg Oral 2 times daily 05/29/21 1322     05/21/21 1400  ceFAZolin (ANCEF) IVPB 2g/100 mL premix  Status:  Discontinued        2 g 200 mL/hr over 30 Minutes Intravenous Every 8 hours 05/21/21 0945 05/29/21 1322   05/19/21 1800  vancomycin (VANCOREADY) IVPB 1500 mg/300 mL  Status:  Discontinued        1,500 mg 150 mL/hr over 120 Minutes Intravenous Every 24 hours 05/19/21 0718 05/21/21 0944   05/18/21 1800  vancomycin (VANCOREADY) IVPB 1250 mg/250 mL  Status:  Discontinued        1,250 mg 166.7 mL/hr over 90 Minutes Intravenous Every 24 hours 05/17/21 1842 05/19/21 0718   05/18/21 1300  cefTRIAXone (ROCEPHIN) 2 g in sodium chloride 0.9 % 100 mL IVPB  Status:  Discontinued        2 g 200 mL/hr  over 30 Minutes Intravenous Every 24 hours 05/17/21 1847 05/21/21 0944   05/17/21 1300  vancomycin (VANCOREADY) IVPB 1500 mg/300 mL        1,500 mg 150 mL/hr over 120 Minutes Intravenous  Once 05/17/21 1233 05/17/21 1907   05/17/21 1215  vancomycin (VANCOCIN) IVPB 1000 mg/200 mL premix  Status:  Discontinued        1,000 mg 200 mL/hr over 60 Minutes Intravenous  Once 05/17/21 1213 05/17/21 1233   05/17/21 1215  cefTRIAXone (ROCEPHIN) 2 g in sodium chloride 0.9 % 100 mL IVPB        2 g 200 mL/hr over 30 Minutes Intravenous  Once 05/17/21 1213 05/17/21 1422        I have personally reviewed the following labs and images: CBC: Recent Labs  Lab 05/28/21 0325 05/29/21 0357 05/30/21 0347 05/31/21 0400 06/01/21 0415  WBC 4.8 6.9 7.3 5.7 6.3  NEUTROABS 3.2 4.3 4.5 3.3 4.3  HGB 8.6* 8.4* 8.5* 9.0* 9.1*  HCT 26.8* 26.6* 26.5* 28.3* 28.8*  MCV 89.0 89.6 90.1 90.1 90.9  PLT 34* 39* 51* 72* 100*   BMP &GFR Recent Labs  Lab 05/28/21 0325  05/29/21 0356 05/30/21 0347 05/31/21 0400 05/31/21 2119  NA 142 140 138 138 135  K 3.7 4.0 4.0 3.9 4.4  CL 110 110 107 108 105  CO2 27 25 26 25 25   GLUCOSE 104* 91 102* 102* 159*  BUN 14 12 13 10 12   CREATININE 0.73 0.63 0.72 0.69 0.81  CALCIUM 8.6* 8.5* 8.6* 8.8* 9.1  MG 1.8  --   --   --  1.9  PHOS  --   --   --   --  2.5   Estimated Creatinine Clearance: 92.1 mL/min (by C-G formula based on SCr of 0.81 mg/dL). Liver & Pancreas: Recent Labs  Lab 05/31/21 2119  ALBUMIN 2.4*   No results for input(s): LIPASE, AMYLASE in the last 168 hours. No results for input(s): AMMONIA in the last 168 hours. Diabetic: No results for input(s): HGBA1C in the last 72 hours. No results for input(s): GLUCAP in the last 168 hours. Cardiac Enzymes: No results for input(s): CKTOTAL, CKMB, CKMBINDEX, TROPONINI in the last 168 hours. No results for input(s): PROBNP in the last 8760 hours. Coagulation Profile: No results for input(s): INR, PROTIME in the last 168 hours. Thyroid Function Tests: No results for input(s): TSH, T4TOTAL, FREET4, T3FREE, THYROIDAB in the last 72 hours. Lipid Profile: No results for input(s): CHOL, HDL, LDLCALC, TRIG, CHOLHDL, LDLDIRECT in the last 72 hours. Anemia Panel: No results for input(s): VITAMINB12, FOLATE, FERRITIN, TIBC, IRON, RETICCTPCT in the last 72 hours. Urine analysis:    Component Value Date/Time   COLORURINE YELLOW 05/17/2021 1142   APPEARANCEUR HAZY (A) 05/17/2021 1142   LABSPEC 1.012 05/17/2021 1142   PHURINE 7.0 05/17/2021 1142   GLUCOSEU NEGATIVE 05/17/2021 1142   HGBUR NEGATIVE 05/17/2021 1142   BILIRUBINUR NEGATIVE 05/17/2021 1142   [place] 05/17/2021 1142   PROTEINUR 30 (A) 05/17/2021 1142   UROBILINOGEN 1.0 01/20/2020 1330   NITRITE POSITIVE (A) 05/17/2021 1142   LEUKOCYTESUR NEGATIVE 05/17/2021 1142   Sepsis Labs: Invalid input(s): PROCALCITONIN, [place]  Microbiology: Recent Results (from the past 240 hour(s))   Aerobic/Anaerobic Culture w Gram Stain (surgical/deep wound)     Status: None   Collection Time: 05/25/21 12:02 PM   Specimen: Tissue  Result Value Ref Range Status   Specimen Description   Final    TISSUE UNDERNEATH LT  ELBOW Performed at [facility], [place] [address], [place], [place] [address]    Special Requests   Final    NONE Performed at [facility], [place] [address], [place], [place] [address]    Gram Stain NO WBC SEEN NO ORGANISMS SEEN   Final   Culture   Final    MODERATE ESCHERICHIA COLI CRITICAL RESULT CALLED TO, READ BACK BY AND VERIFIED WITH: RN CELIA CALLENDER [phone] FCP NO ANAEROBES ISOLATED Performed at [facility], [place] [address], [place], [place] [address]    Report Status 05/30/2021 FINAL  Final   Organism ID, Bacteria ESCHERICHIA COLI  Final      Susceptibility   Escherichia coli - MIC*    AMPICILLIN <=2 SENSITIVE Sensitive     CEFAZOLIN <=4 SENSITIVE Sensitive     CEFEPIME <=0.12 SENSITIVE Sensitive     CEFTAZIDIME <=1 SENSITIVE Sensitive     CEFTRIAXONE <=0.25 SENSITIVE Sensitive     CIPROFLOXACIN <=0.25 SENSITIVE Sensitive     GENTAMICIN <=1 SENSITIVE Sensitive     IMIPENEM <=0.25 SENSITIVE Sensitive     TRIMETH/SULFA <=20 SENSITIVE Sensitive     AMPICILLIN/SULBACTAM <=2 SENSITIVE Sensitive     PIP/TAZO <=4 SENSITIVE Sensitive     * MODERATE ESCHERICHIA COLI  Aerobic Culture w Gram Stain (superficial specimen)     Status: None   Collection Time: 05/25/21 12:05 PM   Specimen: Abscess  Result Value Ref Range Status   Specimen Description   Final    ABSCESS LT LAT FOREARM Performed at [place] [address], [place], [place] [address]    Special Requests   Final    NONE Performed at [facility], [place] [address], [place], [place] [address]    Gram Stain   Final    NO WBC SEEN RARE GRAM NEGATIVE RODS Performed at [facility], [place] [address],  [place], [place] [address]    Culture RARE ESCHERICHIA COLI  Final   Report Status 05/27/2021 FINAL  Final   Organism ID, Bacteria ESCHERICHIA COLI  Final      Susceptibility   Escherichia coli - MIC*    AMPICILLIN <=2 SENSITIVE Sensitive     CEFAZOLIN <=4 SENSITIVE Sensitive     CEFEPIME <=0.12 SENSITIVE Sensitive     CEFTAZIDIME <=1 SENSITIVE Sensitive     CEFTRIAXONE <=0.25 SENSITIVE Sensitive     CIPROFLOXACIN <=0.25 SENSITIVE Sensitive     GENTAMICIN <=1 SENSITIVE Sensitive     IMIPENEM <=0.25 SENSITIVE Sensitive     TRIMETH/SULFA <=20 SENSITIVE Sensitive     AMPICILLIN/SULBACTAM <=2 SENSITIVE Sensitive     PIP/TAZO <=4 SENSITIVE Sensitive     * RARE ESCHERICHIA COLI  Aerobic/Anaerobic Culture w Gram Stain (surgical/deep wound)     Status: None (Preliminary result)   Collection Time: 05/31/21  2:00 PM   Specimen: PATH Soft tissue  Result Value Ref Range Status   Specimen Description   Final    TISSUE Performed at [place] [address], [place], [place] [address]    Special Requests   Final    NONE LEFT HIP MASS Performed at [place] [address], [place],  [address]    Gram Stain   Final    ABUNDANT WBC PRESENT, PREDOMINANTLY PMN FEW GRAM NEGATIVE RODS    Culture   Final    MODERATE ESCHERICHIA COLI SUSCEPTIBILITIES TO FOLLOW Performed at [facility],  [address] [place], [place] [address]    Report Status PENDING  Incomplete    Radiology Studies: No results found.    Evony Rezek T. [place]  If 7PM-7AM, please contact night-coverage [email or web address] 06/01/2021, 3:24 PM

## 2021-06-01 NOTE — Progress Notes (Signed)
Brief oncology note:  Chart and labs have been reviewed.  CBC from this morning shows a normal white blood cell count.  His hemoglobin is stable at 9.1.  Platelets continue to slowly improve and are up to 100,000 today.  CBC    Component Value Date/Time   WBC 6.3 06/01/2021 0415   RBC 3.17 (L) 06/01/2021 0415   HGB 9.1 (L) 06/01/2021 0415   HGB 7.8 (L) 05/10/2021 0933   HCT 28.8 (L) 06/01/2021 0415   PLT 100 (L) 06/01/2021 0415   PLT 210 05/10/2021 0933   MCV 90.9 06/01/2021 0415   MCH 28.7 06/01/2021 0415   MCHC 31.6 06/01/2021 0415   RDW 15.8 (H) 06/01/2021 0415   LYMPHSABS 0.5 (L) 06/01/2021 0415   MONOABS 1.3 (H) 06/01/2021 0415   EOSABS 0.0 06/01/2021 0415   BASOSABS 0.0 06/01/2021 0415    He was scheduled for chemotherapy in our office yesterday.  Scheduling message was sent to delay appointments by about 2 weeks.  Hematology/oncology will sign off at this time.  Please call for questions.  Mikey Bussing, NP   Future Appointments  Date Time Provider [place]  06/15/2021 10:30 AM [facility] LAB [facility] None  06/15/2021 11:00 AM Heilingoetter, Cassandra L, PA-C [facility] None  06/15/2021 12:00 PM [facility] INFUSION [facility] None  06/15/2021 12:00 PM Neff, Barbara L, RD [facility] None  06/21/2021  1:45 PM [facility] LAB [facility] None  06/28/2021  1:45 PM [facility] LAB [facility] None

## 2021-06-01 NOTE — Progress Notes (Signed)
Adam [name] for Infectious Disease  Date of Admission:  05/17/2021           Reason for visit: Follow up on bacteremia and abscesses  Current antibiotics: 5/22-c cefadroxil  5/14-22 cefazolin 5/11-14 ceftriaxone 5/11-12 vanc    ASSESSMENT:    66 y.o. 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  There are 2 areas of eschars on the left elbow area that is bigger than knee that is receiving enzymatic debridement by plastic surgery  I do not think he has lingering endovascular infection with ecoli bacteremia, but rather all the soft tissue lesions had since declared themselves. He is no longer neutropenic and I do not suspect any further new lesion to finally be declared at this time  Will plan a 3 week course of cefadroxil starting 5/24 in setting of a couple of abscesses that are still draining; follow up in id clinic to see if can stop   RECOMMENDATIONS:    Continue cefadroxil; please plan 3 weeks from 5/24-6/14 Follow up with Dr Juleen China on 6/07 @ 330 pm rcid clinic F/u oncology and other surgical services per their discretion Continue wound care Will follow peripherally and make sure 5/24 cx is still ok with cefadroxil but otherwise plan should be final Discussed with primary team  I spent more than 50 minute reviewing data/chart, and coordinating care and  >50% direct face to face time providing counseling/discussing diagnostics/treatment plan with patient   Principal Problem:   Sepsis due to E. coli bacteremia, left forearm and left lower extremity cellulitis/abscess Active Problems:   Primary cancer of right upper lobe of lung (HCC)   HTN (hypertension)   AKI (acute kidney injury) ([place])   Cellulitis of left forearm   Cellulitis of left knee   E coli bacteremia   Thrombocytopenia (HCC)   Severe neutropenia (HCC)   Ecthyma gangrenosum   Gram negative septicemia ([place])   Physical deconditioning    MEDICATIONS:    Scheduled Meds:  sodium chloride   Intravenous Once   sodium chloride   Intravenous Once   cefadroxil  1,000 mg Oral BID   Chlorhexidine Gluconate Cloth  6 each Topical Daily   collagenase   Topical Daily   feeding supplement  237 mL Oral TID BM   Continuous Infusions:   PRN Meds:.acetaminophen **OR** [DISCONTINUED] acetaminophen, sodium chloride flush  SUBJECTIVE:   24 hour events:  Feels well. Think he continues to improve in terms of energy/appetite Had left thigh abscess I&D 5/24 -- cx ecoli No n/v/diarrhea No pain in the left elbow/knee/thigh area  No rash No new lesions otherwise   Review of Systems  All other systems reviewed and are negative.    OBJECTIVE:   Blood pressure 103/77, pulse 96, temperature 98 F (36.7 C), temperature source Oral, resp. rate 18, height 5\' 9"  (1.753 m), weight 76.1 kg, SpO2 99 %.  Body mass index is 24.78 kg/m.  Physical exam: General/constitutional: no distress, pleasant HEENT: Normocephalic, PER, Conj Clear, EOMI, Oropharynx clear Neck supple CV: rrr no mrg Lungs: clear to auscultation, normal respiratory effort Abd: Soft, Nontender Ext: no edema Skin: left thigh abscess no longer there -- packing serosanguinous stain; left knee small abscess is smaller even and minimal purulence on expression, there is another wound about 1.5 inch with eschar; left elbow  large 3 inch wound with eschar at edge and fibrinous exudate; the medial side abscess is small <1inch with no purulence on attempted expression. Neuro: nonfocal  Central line: none (right chest port removed 5/19 and site is nontender)   Lab Results: Lab Results  Component Value Date   WBC 6.3 06/01/2021   HGB 9.1 (L) 06/01/2021   HCT 28.8 (L) 06/01/2021   MCV 90.9 06/01/2021   PLT 100 (L) 06/01/2021    Lab Results  Component Value Date   NA 135 05/31/2021   K 4.4 05/31/2021   CO2 25 05/31/2021   GLUCOSE 159 (H) 05/31/2021   BUN 12 05/31/2021   CREATININE 0.81 05/31/2021   CALCIUM 9.1 05/31/2021   GFRNONAA >60 05/31/2021    Lab Results  Component Value Date   ALT 26 05/18/2021   AST 23 05/18/2021   ALKPHOS 44 05/18/2021   BILITOT 1.0 05/18/2021    No results found for: CRP  No results found for: ESRSEDRATE   I have reviewed the micro and lab results in Epic.  Imaging: Imaging independently reviewed in Epic/incorporated into medical making decision  5/23 ct left knee/femur 1. Three superficial abscesses in the left hip, lateral knee, and proximal lower leg as described above. 2. Smaller 2.1 cm deep abscess along the superficial fascia overlying the left greater trochanter. 3. Unchanged left hemipelvis osseous metastatic disease with pathologic fracture of the left superior pubic ramus.         Adam [name], [place] for Adam [name] [phone]  pager   [phone] cell 06/01/2021, 1:11 PM

## 2021-06-02 ENCOUNTER — Encounter: Payer: Self-pay | Admitting: Internal Medicine

## 2021-06-02 ENCOUNTER — Other Ambulatory Visit (HOSPITAL_COMMUNITY): Payer: Self-pay

## 2021-06-02 LAB — CBC WITH DIFFERENTIAL/PLATELET
Abs Immature Granulocytes: 0.17 10*3/uL — ABNORMAL HIGH (ref 0.00–0.07)
Basophils Absolute: 0 10*3/uL (ref 0.0–0.1)
Basophils Relative: 1 %
Eosinophils Absolute: 0 10*3/uL (ref 0.0–0.5)
Eosinophils Relative: 0 %
HCT: 28.1 % — ABNORMAL LOW (ref 39.0–52.0)
Hemoglobin: 8.8 g/dL — ABNORMAL LOW (ref 13.0–17.0)
Immature Granulocytes: 4 %
Lymphocytes Relative: 15 %
Lymphs Abs: 0.8 10*3/uL (ref 0.7–4.0)
MCH: 28.7 pg (ref 26.0–34.0)
MCHC: 31.3 g/dL (ref 30.0–36.0)
MCV: 91.5 fL (ref 80.0–100.0)
Monocytes Absolute: 1.5 10*3/uL — ABNORMAL HIGH (ref 0.1–1.0)
Monocytes Relative: 31 %
Neutro Abs: 2.4 10*3/uL (ref 1.7–7.7)
Neutrophils Relative %: 49 %
Platelets: 125 10*3/uL — ABNORMAL LOW (ref 150–400)
RBC: 3.07 MIL/uL — ABNORMAL LOW (ref 4.22–5.81)
RDW: 16.6 % — ABNORMAL HIGH (ref 11.5–15.5)
WBC: 4.9 10*3/uL (ref 4.0–10.5)
nRBC: 0.4 % — ABNORMAL HIGH (ref 0.0–0.2)

## 2021-06-02 MED ORDER — COLLAGENASE 250 UNIT/GM EX OINT: TOPICAL_OINTMENT | Freq: Every day | CUTANEOUS | 1 refills | Status: DC

## 2021-06-02 MED ORDER — CEFADROXIL 500 MG PO CAPS: 1000.0000 mg | ORAL_CAPSULE | Freq: Two times a day (BID) | ORAL | 0 refills | Status: DC

## 2021-06-02 MED ORDER — CEFADROXIL 500 MG PO CAPS
1000.0000 mg | ORAL_CAPSULE | Freq: Two times a day (BID) | ORAL | 0 refills | Status: DC
  Filled 2021-06-02: qty 76, 19d supply, fill #0

## 2021-06-02 MED ORDER — ENSURE ENLIVE PO LIQD: 237.0000 mL | Freq: Three times a day (TID) | ORAL | 0 refills | Status: AC

## 2021-06-02 NOTE — Plan of Care (Signed)
  Problem: Education: Goal: Knowledge of General Education information will improve Description: Including pain rating scale, medication(s)/side effects and non-pharmacologic comfort measures 06/02/2021 1422 by Jerene Pitch, RN Outcome: Adequate for Discharge 06/02/2021 1417 by Jerene Pitch, RN Outcome: Progressing   Problem: Clinical Measurements: Goal: Ability to maintain clinical measurements within normal limits will improve 06/02/2021 1422 by Jerene Pitch, RN Outcome: Adequate for Discharge 06/02/2021 1417 by Jerene Pitch, RN Outcome: Progressing Goal: Will remain free from infection Outcome: Adequate for Discharge Goal: Diagnostic test results will improve Outcome: Adequate for Discharge Goal: Respiratory complications will improve Outcome: Adequate for Discharge Goal: Cardiovascular complication will be avoided Outcome: Adequate for Discharge   Problem: Clinical Measurements: Goal: Will remain free from infection Outcome: Adequate for Discharge   Problem: Clinical Measurements: Goal: Will remain free from infection Outcome: Adequate for Discharge   Problem: Clinical Measurements: Goal: Ability to maintain clinical measurements within normal limits will improve 06/02/2021 1422 by Jerene Pitch, RN Outcome: Adequate for Discharge 06/02/2021 1417 by Jerene Pitch, RN Outcome: Progressing   Problem: Activity: Goal: Risk for activity intolerance will decrease Outcome: Adequate for Discharge   Problem: Nutrition: Goal: Adequate nutrition will be maintained Outcome: Adequate for Discharge   Problem: Coping: Goal: Level of anxiety will decrease Outcome: Adequate for Discharge   Problem: Elimination: Goal: Will not experience complications related to bowel motility Outcome: Adequate for Discharge Goal: Will not experience complications related to urinary retention Outcome: Adequate for Discharge   Problem: Pain Managment: Goal: General experience  of comfort will improve Outcome: Adequate for Discharge   Problem: Safety: Goal: Ability to remain free from injury will improve Outcome: Adequate for Discharge   Problem: Skin Integrity: Goal: Risk for impaired skin integrity will decrease Outcome: Adequate for Discharge

## 2021-06-02 NOTE — Discharge Summary (Signed)
Physician Discharge Summary  Adam [name] IOX:[id number] DOB: May 02, 1955 DOA: 05/17/2021  PCP: de [place], [name], MD  Admit date: 05/17/2021 Discharge date: 06/02/2021 Admitted From: Home Disposition: Home Recommendations for Outpatient Follow-up:  Follow ups as below. Please obtain CBC/BMP/Mag at follow up Please follow up on the following pending results: Pathology from left hip excision  Home Health: PT/OT/RN Equipment/Devices: Rolling walker/3 in 1 commode/shower seat  Discharge Condition: Stable but guarded prognosis CODE STATUS: Full code  Follow-up Information     [facility] Follow up on 07/04/2021.   Specialty: General Surgery Why: 8:30am, arrive by 8:00am for paperwork and check in process.  this is an appointment for a wound check for your hip Contact information: [place] [address] [phone]         de [place], [name], MD. Schedule an appointment as soon as possible for a visit in 1 week(s).   Specialty: Family Medicine Contact information: [place] [address] [phone]         Erle Crocker, MD. Schedule an appointment as soon as possible for a visit in 1 week(s).   Specialty: Orthopedic Surgery Contact information: [place] [address] [place], Paia, DO Follow up on 06/14/2021.   Specialties: Infectious Diseases, Internal Medicine Contact information: [address] [place] [address] [facility] course 66 year old M with PMH of metastatic small cell lung cancer and HTN presenting from concentrator due to left knee and left forearm swelling, and fall at home.  He underwent bedside debridement in ER for his left forearm and knee swelling with packing.  He was started on IV antibiotics.  After admission, he was found to have E. coli bacteremia.  Infectious disease and ID consulted.   He was reevaluated by orthopedic surgery on 5/18 and underwent I&D of left proximal forearm wound.  Wound culture grew E. coli.  Port a cath removed on 5/19.  Orthopedic surgery recommended CT of his thigh and knee, that showed 3 superficial abscesses of the left hip, lateral knee and proximal lower leg.  Ortho recommended general surgery and plastic surgery consultation.  Patient underwent excision of left hip subcutaneous tissue mass on 05/31/2021.  Tissue culture with moderate E. coli.  Pathology pending.  ID recommends cefadroxil for 3 weeks from 05/31/2021 with close outpatient follow-up.  Therapy recommended home health and DME.  Plan is discharged home with home health once family member educated on wound care since he requires daily to twice daily dressing.  General surgery also planning to place wound VAC on his left hip.     See individual problem list below for more on hospital course.  Problems addressed during this hospitalization Problem  Sepsis due to E. coli bacteremia, left forearm and left lower extremity cellulitis/abscess  Pancytopenia (Hcc)  Severe Neutropenia (Hcc)  Primary Cancer of Right Upper Lobe of Lung (Hcc)  Thrombocytopenia (Hcc)  Aki (Acute Kidney Injury) (Hcc)  Physical Deconditioning  Htn (Hypertension)    Assessment and Plan: * Sepsis due to E. coli bacteremia, left forearm and left lower extremity cellulitis/abscess Blood culture with E. coli bacteremia.  Patient with left forearm and left lower extremity cellulitis with abscess.  He was tachycardic with tachypnea and neutropenia although neutropenia could be due to recent chemotherapy.  Sepsis physiology resolved. -S/p I&D and packing in ED on 5/10 -S/p  I&D by orthopedic surgery on 5/18.  Culture with pansensitive E. coli -S/p excisional debridement of left hip tissue mass on 5/24.  Culture with E. Coli.  Follow-up pathology -ID recommends p.o. cefadroxil for 3 weeks from 5/24 -Wound care per general  surgery and orthopedic surgery-requires daily dressing -Patient stated his ex-wife is a CNA who could help.  RN to call her for education -Home health PT/OT/RN/RW/3 and 1/shower seat ordered.   Pancytopenia ([place]) Likely related to chemo.  Leukopenia resolved although he still has some lymphocytopenia.  Anemia stable.  Thrombocytopenia improved. -Continue monitoring  Severe neutropenia (HCC) Felt to be due to chemotherapy.  Received Granix on 5/20.  Resolved.  Primary cancer of right upper lobe of lung [facility]) Per oncology.  Thrombocytopenia ([place]) Likely due to chemotherapy.  Transfused about 3 apheresis so far.  Improving. -Transfuse for platelet count less than<20k per oncology.   AKI (acute kidney injury) [facility]) Resolved   Physical deconditioning PT/OT eval-recommended home meds and DME's.  HTN (hypertension) Normotensive off home amlodipine, lisinopril and HCTZ.   Vital signs Vitals:   06/02/21 0417 06/02/21 1205 06/02/21 1206 06/02/21 1559  BP: 108/79   105/72  Pulse: 93 (!) 101 (!) 101 (!) 103  Temp: 98.4 F (36.9 C) 97.9 F (36.6 C) 98.7 F (37.1 C)   Resp: 18 18    Height:      Weight:      SpO2: 99% 100% 100%   TempSrc: Oral Oral Oral   BMI (Calculated):         Discharge exam  GENERAL: Appears frail.  Nontoxic. HEENT: MMM.  Vision and hearing grossly intact.  NECK: Supple.  No apparent JVD.  RESP:  No IWOB.  Fair aeration bilaterally. CVS:  RRR. Heart sounds normal.  ABD/GI/GU: BS+. Abd soft, NTND.  MSK/EXT:  Moves extremities.  Wound VAC over left hip.  Dressing over left forearm and left knee DCI. SKIN: As above. NEURO: Awake and alert. Oriented appropriately.  No apparent focal neuro deficit. PSYCH: Calm. Normal affect.   Discharge Instructions Discharge Instructions     Call MD for:  extreme fatigue   Complete by: As directed    Call MD for:  redness, tenderness, or signs of infection (pain, swelling, redness, odor or green/yellow discharge  around incision site)   Complete by: As directed    Call MD for:  severe uncontrolled pain   Complete by: As directed    Call MD for:  temperature >100.4   Complete by: As directed    Diet general   Complete by: As directed    Discharge instructions   Complete by: As directed    It has been a pleasure taking care of you!  You were hospitalized due to left arm and left leg infection as well as bloodstream infection for which you have been treated surgically medically with antibiotics.  We are discharging you on more antibiotics to complete treatment course.  It is very important that you take the whole course of antibiotics.  Know that we have made changes to your home medication during this hospitalization.  Please review your new medication list and the directions on your medications before you take them.  Follow-up with your primary care doctor in 1 to 2 weeks.  Follow-up with infectious disease, general surgery, orthopedic surgery and your oncologist per their recommendation.   Take care,   Discharge wound care:  Complete by: As directed    Keep wound vac on for left hip wound.    Daily dressing changes to wound/eschar on left forearm and left knee: -Cleanse wound with sterile saline (THIS PART IS IMPORTANT).  -Apply Santyl to wound/eschar followed by non-adherent pad and 4 x 4 gauze.  -Secure with Kerlix and Ace wrap.  Remove the packing from the left knee wound. Using a cotton tipped applicator, insert a strip of 1/2 inch Iodoform packing Kellie Simmering [phone]). Leave a piece hanging out. Place a size appropriate foam dressing over it. Change daily.   Increase activity slowly   Complete by: As directed       Allergies as of 06/02/2021   No Known Allergies      Medication List     STOP taking these medications    amLODipine 10 MG tablet Commonly known as: NORVASC   aspirin 81 MG chewable tablet   lisinopril-hydrochlorothiazide 20-12.5 MG tablet Commonly known as:  ZESTORETIC   methylPREDNISolone 4 MG Tbpk tablet Commonly known as: MEDROL DOSEPAK   nystatin 100000 UNIT/ML suspension Commonly known as: MYCOSTATIN   potassium chloride SA 20 MEQ tablet Commonly known as: KLOR-CON M       TAKE these medications    acetaminophen 650 MG CR tablet Commonly known as: TYLENOL Take 650 mg by mouth every 8 (eight) hours as needed for pain.   bisacodyl 5 MG EC tablet Commonly known as: DULCOLAX Take 5 mg by mouth daily as needed for moderate constipation.   cefadroxil 500 MG capsule Commonly known as: DURICEF Take 2 capsules (1,000 mg total) by mouth 2 (two) times daily for 19 days.   collagenase 250 UNIT/GM ointment Commonly known as: SANTYL Apply topically daily.   feeding supplement Liqd Take 237 mLs by mouth 3 (three) times daily between meals.   lidocaine-prilocaine cream Commonly known as: EMLA Apply 1 application topically as needed. What changed: reasons to take this   mirtazapine 15 MG tablet Commonly known as: Remeron Take 2 tablets (30 mg total) by mouth at bedtime.               Durable Medical Equipment  (From admission, onward)           Start     Ordered   06/01/21 0754  For home use only DME Walker rolling  Once       Question Answer Comment  Walker: With [place] Wheels   Patient needs a walker to treat with the following condition Generalized weakness      06/01/21 0753   06/01/21 0754  For home use only DME Shower stool  Once        06/01/21 0753   06/01/21 0753  For home use only DME Bedside commode  Once       Question:  Patient needs a bedside commode to treat with the following condition  Answer:  Generalized weakness   06/01/21 0753              Discharge Care Instructions  (From admission, onward)           Start     Ordered   06/02/21 0000  Discharge wound care:       Comments: Keep wound vac on for left hip wound.    Daily dressing changes to wound/eschar on left forearm and  left knee: -Cleanse wound with sterile saline (THIS PART IS IMPORTANT).  -Apply Santyl to wound/eschar followed by non-adherent pad and 4 x  4 gauze.  -Secure with Kerlix and Ace wrap.  Remove the packing from the left knee wound. Using a cotton tipped applicator, insert a strip of 1/2 inch Iodoform packing Kellie Simmering [phone]). Leave a piece hanging out. Place a size appropriate foam dressing over it. Change daily.   06/02/21 1125            Consultations: Orthopedic surgery General surgery Plastic surgery Infectious disease Oncology  Procedures/Studies:   DG Chest 2 View  Result Date: 05/17/2021 CLINICAL DATA:  Suspected sepsis.  History of lung cancer. EXAM: CHEST - 2 VIEW COMPARISON:  04/07/2021 CT FINDINGS: Right Port-A-Cath tip at superior caval/atrial junction. Numerous leads and wires project over the chest. Tracheal deviation right. Right hemidiaphragm elevation. Normal heart size. No pleural fluid. 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 Radiation change within the right suprahilar region and paramediastinal lung. No superimposed lobar consolidation. IMPRESSION: No evidence of pneumonia. 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 Emphysema (ICD10-J43.9). Electronically Signed   By: Abigail Miyamoto M.D.   On: 05/17/2021 12:12   DG Forearm Left  Result Date: 05/17/2021 CLINICAL DATA:  Left forearm wound after fall several weeks ago. EXAM: LEFT FOREARM - 2 VIEW COMPARISON:  None Available. FINDINGS: There is no evidence of fracture or other focal bone lesions. Large amount of soft tissue gas is seen around proximal forearm. Some high density material is noted within the soft tissues which may represent either surgical packing or other foreign body. IMPRESSION: No fracture or dislocation is noted. Large amount of soft tissue gas seen in proximal forearm consistent with  laceration or other wounds. High density material is seen in these areas which may represent surgical packing or other foreign body; clinical correlation is recommended Electronically Signed   By: Marijo Conception M.D.   On: 05/17/2021 20:41   DG Knee 1-2 Views Left  Result Date: 05/17/2021 CLINICAL DATA:  Left knee wound after fall. EXAM: LEFT KNEE - 1-2 VIEW COMPARISON:  None Available. FINDINGS: No evidence of fracture, dislocation, or joint effusion. No evidence of arthropathy. Mild patellar spurring is noted. Vascular calcifications are noted. Soft tissue gas is noted lateral to the joint space consistent with laceration. IMPRESSION: Soft tissue gas noted lateral to the joint space consistent with laceration. No fracture or dislocation is noted. Electronically Signed   By: Marijo Conception M.D.   On: 05/17/2021 20:38   CT FEMUR LEFT W CONTRAST  Result Date: 05/30/2021 CLINICAL DATA:  Left lateral thigh/knee wound. Left knee soft tissue abscess incision and drainage in the ER 2 weeks ago. History of lung cancer. EXAM: CT OF THE LOWER LEFT EXTREMITY WITH CONTRAST TECHNIQUE: Multidetector CT imaging of the left femur and knee was performed according to the standard protocol following intravenous contrast administration. RADIATION DOSE REDUCTION: This exam was performed according to the departmental dose-optimization program which includes automated exposure control, adjustment of the mA and/or kV according to patient size and/or use of iterative reconstruction technique. CONTRAST:  197mL OMNIPAQUE IOHEXOL 300 MG/ML  SOLN COMPARISON:  Left knee x-rays dated May 17, 2021. CT abdomen pelvis dated April 07, 2021. FINDINGS: Bones/Joint/Cartilage Lytic lesions in the left pubic rami and ischial tuberosity again noted with subacute pathologic fracture of the left superior pubic ramus and parasymphyseal pubic bone, similar to prior. Mild left hip osteoarthritis. No hip or knee joint effusion. Ligaments Ligaments are  suboptimally  evaluated by CT. Muscles and Tendons Grossly intact. Soft tissue Superficial 2.8 x 3.9 x 3.8 cm rim enhancing fluid collection in the posterolateral left hip with overlying prominent skin thickening (series 6, image 72). Slightly more superiorly and deep along the superficial fascia, there is a 2.1 x 1.1 x 2.1 cm rim enhancing fluid collection (series 6, image 63). Superficial 5.5 x 1.1 x 4.5 cm rim enhancing fluid collection in the lateral knee draining to the skin surface (series 6, image 331). Superficial 1.7 x 1.0 x 2.5 cm rim enhancing fluid collection in the lateral proximal lower leg (series 6, image 363). Prominent skin thickening overlying both fluid collections. No soft tissue mass. IMPRESSION: 1. Three superficial abscesses in the left hip, lateral knee, and proximal lower leg as described above. 2. Smaller 2.1 cm deep abscess along the superficial fascia overlying the left greater trochanter. 3. Unchanged left hemipelvis osseous metastatic disease with pathologic fracture of the left superior pubic ramus. Electronically Signed   By: Titus Dubin M.D.   On: 05/30/2021 12:30   US RENAL  Result Date: 05/17/2021 CLINICAL DATA:  Acute renal insufficiency EXAM: RENAL / URINARY TRACT ULTRASOUND COMPLETE COMPARISON:  CT 04/07/2021 FINDINGS: Right Kidney: Renal measurements: 10.9 x 4.9 x 4.6 cm = volume: 129 mL. Echogenicity within normal limits. No mass or hydronephrosis visualized. Left Kidney: Renal measurements: 10.1 x 5.8 x 4.8 cm = volume: 146 mL. Echogenicity within normal limits. No mass or hydronephrosis visualized. 9 mm nonobstructing calculus noted within the interpolar region of the left kidney. Bladder: Appears normal for degree of bladder distention. Bilateral ureteral jets are identified Other: None. IMPRESSION: Stable left nonobstructing nephrolithiasis. Otherwise unremarkable examination. Electronically Signed   By: Fidela Salisbury M.D.   On: 05/17/2021 19:26   CT KNEE LEFT W  CONTRAST  Result Date: 05/30/2021 CLINICAL DATA:  Left lateral thigh/knee wound. Left knee soft tissue abscess incision and drainage in the ER 2 weeks ago. History of lung cancer. EXAM: CT OF THE LOWER LEFT EXTREMITY WITH CONTRAST TECHNIQUE: Multidetector CT imaging of the left femur and knee was performed according to the standard protocol following intravenous contrast administration. RADIATION DOSE REDUCTION: This exam was performed according to the departmental dose-optimization program which includes automated exposure control, adjustment of the mA and/or kV according to patient size and/or use of iterative reconstruction technique. CONTRAST:  140mL OMNIPAQUE IOHEXOL 300 MG/ML  SOLN COMPARISON:  Left knee x-rays dated May 17, 2021. CT abdomen pelvis dated April 07, 2021. FINDINGS: Bones/Joint/Cartilage Lytic lesions in the left pubic rami and ischial tuberosity again noted with subacute pathologic fracture of the left superior pubic ramus and parasymphyseal pubic bone, similar to prior. Mild left hip osteoarthritis. No hip or knee joint effusion. Ligaments Ligaments are suboptimally evaluated by CT. Muscles and Tendons Grossly intact. Soft tissue Superficial 2.8 x 3.9 x 3.8 cm rim enhancing fluid collection in the posterolateral left hip with overlying prominent skin thickening (series 6, image 72). Slightly more superiorly and deep along the superficial fascia, there is a 2.1 x 1.1 x 2.1 cm rim enhancing fluid collection (series 6, image 63). Superficial 5.5 x 1.1 x 4.5 cm rim enhancing fluid collection in the lateral knee draining to the skin surface (series 6, image 331). Superficial 1.7 x 1.0 x 2.5 cm rim enhancing fluid collection in the lateral proximal lower leg (series 6, image 363). Prominent skin thickening overlying both fluid collections. No soft tissue mass. IMPRESSION: 1. Three superficial abscesses in the left hip, lateral  knee, and proximal lower leg as described above. 2. Smaller 2.1 cm deep  abscess along the superficial fascia overlying the left greater trochanter. 3. Unchanged left hemipelvis osseous metastatic disease with pathologic fracture of the left superior pubic ramus. Electronically Signed   By: Titus Dubin M.D.   On: 05/30/2021 12:30   IR REMOVAL TUN ACCESS W/ PORT W/O FL MOD SED  Result Date: 05/26/2021 INDICATION: 66 year old with lung cancer and bacteremia. Request for Port-A-Cath removal. EXAM: REMOVAL OF RIGHT CHEST PORT-A-CATH MEDICATIONS: % lidocaine ANESTHESIA/SEDATION: None FLUOROSCOPY: None COMPLICATIONS: None immediate. PROCEDURE: Informed written consent was obtained from the patient after a thorough discussion of the procedural risks, benefits and alternatives. All questions were addressed. Maximal Sterile Barrier Technique was utilized including caps, mask, sterile gowns, sterile gloves, sterile drape, hand hygiene and skin antiseptic. A timeout was performed prior to the initiation of the procedure. The right chest was prepped and draped in a sterile fashion. Lidocaine was utilized for local anesthesia. An incision was made over the previously healed surgical incision. Utilizing blunt dissection, the port catheter and reservoir were removed from the underlying subcutaneous tissue in their entirety. The pocket was irrigated with a copious amount of sterile normal saline. The subcutaneous tissue was closed with 3-0 Vicryl interrupted subcutaneous stitches. A 4-0 Vicryl running subcuticular stitch was utilized to approximate the skin. Dermabond was applied. FINDINGS: Normal appearance of the skin site. Port pocket looked healthy without signs of infection. IMPRESSION: Successful removal of the right chest Port-A-Cath. Electronically Signed   By: Markus Daft M.D.   On: 05/26/2021 17:30       The results of significant diagnostics from this hospitalization (including imaging, microbiology, ancillary and laboratory) are listed below for reference.      Microbiology: Recent Results (from the past 240 hour(s))  Aerobic/Anaerobic Culture w Gram Stain (surgical/deep wound)     Status: None   Collection Time: 05/25/21 12:02 PM   Specimen: Tissue  Result Value Ref Range Status   Specimen Description   Final    TISSUE UNDERNEATH LT ELBOW Performed at [facility] [address], [place], [place] [address]    Special Requests   Final    NONE Performed at [facility], [place] [address], [place], [place] [address]    Gram Stain NO WBC SEEN NO ORGANISMS SEEN   Final   Culture   Final    MODERATE ESCHERICHIA COLI CRITICAL RESULT CALLED TO, READ BACK BY AND VERIFIED WITH: RN CELIA CALLENDER [id number] FCP NO ANAEROBES ISOLATED Performed at [facility], [place] [address] [place], [place] [address]    Report Status 05/30/2021 FINAL  Final   Organism ID, Bacteria ESCHERICHIA COLI  Final      Susceptibility   Escherichia coli - MIC*    AMPICILLIN <=2 SENSITIVE Sensitive     CEFAZOLIN <=4 SENSITIVE Sensitive     CEFEPIME <=0.12 SENSITIVE Sensitive     CEFTAZIDIME <=1 SENSITIVE Sensitive     CEFTRIAXONE <=0.25 SENSITIVE Sensitive     CIPROFLOXACIN <=0.25 SENSITIVE Sensitive     GENTAMICIN <=1 SENSITIVE Sensitive     IMIPENEM <=0.25 SENSITIVE Sensitive     TRIMETH/SULFA <=20 SENSITIVE Sensitive     AMPICILLIN/SULBACTAM <=2 SENSITIVE Sensitive     PIP/TAZO <=4 SENSITIVE Sensitive     * MODERATE ESCHERICHIA COLI  Aerobic Culture w Gram Stain (superficial specimen)     Status: None   Collection Time: 05/25/21 12:05 PM   Specimen:  Abscess  Result Value Ref Range Status   Specimen Description   Final    ABSCESS LT LAT FOREARM Performed at [facility] [address], [place], [place] [address]    Special Requests   Final    NONE Performed at [facility], [place] [address] [place], [place] [address]    Gram Stain   Final    NO WBC SEEN RARE GRAM  NEGATIVE RODS Performed at [facility], [place] [name], [place], [place] [address]    Culture RARE ESCHERICHIA COLI  Final   Report Status 05/27/2021 FINAL  Final   Organism ID, Bacteria ESCHERICHIA COLI  Final      Susceptibility   Escherichia coli - MIC*    AMPICILLIN <=2 SENSITIVE Sensitive     CEFAZOLIN <=4 SENSITIVE Sensitive     CEFEPIME <=0.12 SENSITIVE Sensitive     CEFTAZIDIME <=1 SENSITIVE Sensitive     CEFTRIAXONE <=0.25 SENSITIVE Sensitive     CIPROFLOXACIN <=0.25 SENSITIVE Sensitive     GENTAMICIN <=1 SENSITIVE Sensitive     IMIPENEM <=0.25 SENSITIVE Sensitive     TRIMETH/SULFA <=20 SENSITIVE Sensitive     AMPICILLIN/SULBACTAM <=2 SENSITIVE Sensitive     PIP/TAZO <=4 SENSITIVE Sensitive     * RARE ESCHERICHIA COLI  Aerobic/Anaerobic Culture w Gram Stain (surgical/deep wound)     Status: None (Preliminary result)   Collection Time: 05/31/21  2:00 PM   Specimen: PATH Soft tissue  Result Value Ref Range Status   Specimen Description   Final    TISSUE Performed at [facility] [address], [place], [place] [address]    Special Requests   Final    NONE LEFT HIP MASS Performed at [facility] [address], [place], [place] [address]    Gram Stain   Final    ABUNDANT WBC PRESENT, PREDOMINANTLY PMN FEW GRAM NEGATIVE RODS Performed at [facility], [place] [address], [place],  [address]    Culture   Final    MODERATE ESCHERICHIA COLI NO ANAEROBES ISOLATED; CULTURE IN PROGRESS FOR 5 DAYS    Report Status PENDING  Incomplete   Organism ID, Bacteria ESCHERICHIA COLI  Final      Susceptibility   Escherichia coli - MIC*    AMPICILLIN <=2 SENSITIVE Sensitive     CEFAZOLIN <=4 SENSITIVE Sensitive     CEFEPIME <=0.12 SENSITIVE Sensitive     CEFTAZIDIME <=1 SENSITIVE Sensitive     CEFTRIAXONE <=0.25 SENSITIVE Sensitive     CIPROFLOXACIN <=0.25 SENSITIVE Sensitive     GENTAMICIN <=1 SENSITIVE Sensitive      IMIPENEM <=0.25 SENSITIVE Sensitive     TRIMETH/SULFA <=20 SENSITIVE Sensitive     AMPICILLIN/SULBACTAM <=2 SENSITIVE Sensitive     PIP/TAZO <=4 SENSITIVE Sensitive     * MODERATE ESCHERICHIA COLI     Labs:  CBC: Recent Labs  Lab 05/29/21 0357 05/30/21 0347 05/31/21 0400 06/01/21 0415 06/02/21 0444  WBC 6.9 7.3 5.7 6.3 4.9  NEUTROABS 4.3 4.5 3.3 4.3 2.4  HGB 8.4* 8.5* 9.0* 9.1* 8.8*  HCT 26.6* 26.5* 28.3* 28.8* 28.1*  MCV 89.6 90.1 90.1 90.9 91.5  PLT 39* 51* 72* 100* 125*   BMP &GFR Recent Labs  Lab 05/28/21 0325 05/29/21 0356 05/30/21 0347 05/31/21 0400 05/31/21 2119  NA 142 140 138 138 135  K 3.7 4.0 4.0 3.9 4.4  CL 110 110 107 108 105  CO2 27 25 26  25  25  GLUCOSE 104* 91 102* 102* 159*  BUN 14 12 13 10 12   CREATININE 0.73 0.63 0.72 0.69 0.81  CALCIUM 8.6* 8.5* 8.6* 8.8* 9.1  MG 1.8  --   --   --  1.9  PHOS  --   --   --   --  2.5   Estimated Creatinine Clearance: 92.1 mL/min (by C-G formula based on SCr of 0.81 mg/dL). Liver & Pancreas: Recent Labs  Lab 05/31/21 2119  ALBUMIN 2.4*   No results for input(s): LIPASE, AMYLASE in the last 168 hours. No results for input(s): AMMONIA in the last 168 hours. Diabetic: No results for input(s): HGBA1C in the last 72 hours. No results for input(s): GLUCAP in the last 168 hours. Cardiac Enzymes: No results for input(s): CKTOTAL, CKMB, CKMBINDEX, TROPONINI in the last 168 hours. No results for input(s): PROBNP in the last 8760 hours. Coagulation Profile: No results for input(s): INR, PROTIME in the last 168 hours. Thyroid Function Tests: No results for input(s): TSH, T4TOTAL, FREET4, T3FREE, THYROIDAB in the last 72 hours. Lipid Profile: No results for input(s): CHOL, HDL, LDLCALC, TRIG, CHOLHDL, LDLDIRECT in the last 72 hours. Anemia Panel: No results for input(s): VITAMINB12, FOLATE, FERRITIN, TIBC, IRON, RETICCTPCT in the last 72 hours. Urine analysis:    Component Value Date/Time   COLORURINE YELLOW  05/17/2021 1142   APPEARANCEUR HAZY (A) 05/17/2021 1142   LABSPEC 1.012 05/17/2021 1142   PHURINE 7.0 05/17/2021 1142   GLUCOSEU NEGATIVE 05/17/2021 1142   HGBUR NEGATIVE 05/17/2021 1142   BILIRUBINUR NEGATIVE 05/17/2021 1142   [place]ange 05/17/2021 1142   PROTEINUR 30 (A) 05/17/2021 1142   UROBILINOGEN 1.0 01/20/2020 1330   NITRITE POSITIVE (A) 05/17/2021 1142   LEUKOCYTESUR NEGATIVE 05/17/2021 1142   Sepsis Labs: Invalid input(s): PROCALCITONIN, LACTICIDVEN   Time coordinating discharge: 45 minutes  SIGNED:  Mercy Riding, MD  [facility] 06/02/2021, 10:10 PM

## 2021-06-02 NOTE — Consult Note (Signed)
WOC Nurse Consult Note: Patient receiving care in [place]. Reason for Consult: initiation of VAC therapy to left thigh surgical wound Wound type: surgical excision site. Pressure Injury POA: Yes/No/NA Measurement: 11 cm x 7.5 cm x 0.9 cm Wound bed: 100% clean, pink Drainage (amount, consistency, odor) none Periwound: intact Dressing procedure/placement/frequency: One piece of black foam cut to fit wound opening. Drape applied, immediate seal obtained. Patient tolerated without any discomfort.   Because of the simplicity of this VAC dressing, the bedside nurse should be able to perform without difficulty.  [place] nurse will not follow at this time.  Please re-consult the [place] team if needed.  Val Riles, RN, MSN, CWOCN, CNS-BC, pager [phone]

## 2021-06-02 NOTE — Plan of Care (Signed)

## 2021-06-02 NOTE — Plan of Care (Signed)
Problem: Education: Goal: Knowledge of General Education information will improve Description: Including pain rating scale, medication(s)/side effects and non-pharmacologic comfort measures 06/02/2021 1422 by Jerene Pitch, RN Outcome: Adequate for Discharge 06/02/2021 1422 by Jerene Pitch, RN Outcome: Adequate for Discharge 06/02/2021 1417 by Jerene Pitch, RN Outcome: Progressing   Problem: Health Behavior/Discharge Planning: Goal: Ability to manage health-related needs will improve 06/02/2021 1422 by Jerene Pitch, RN Outcome: Adequate for Discharge 06/02/2021 1422 by Jerene Pitch, RN Outcome: Adequate for Discharge 06/02/2021 1417 by Jerene Pitch, RN Outcome: Progressing   Problem: Clinical Measurements: Goal: Ability to maintain clinical measurements within normal limits will improve 06/02/2021 1422 by Jerene Pitch, RN Outcome: Adequate for Discharge 06/02/2021 1422 by Jerene Pitch, RN Outcome: Adequate for Discharge 06/02/2021 1417 by Jerene Pitch, RN Outcome: Progressing Goal: Will remain free from infection 06/02/2021 1422 by Jerene Pitch, RN Outcome: Adequate for Discharge 06/02/2021 1422 by Jerene Pitch, RN Outcome: Adequate for Discharge Goal: Diagnostic test results will improve 06/02/2021 1422 by Jerene Pitch, RN Outcome: Adequate for Discharge 06/02/2021 1422 by Jerene Pitch, RN Outcome: Adequate for Discharge Goal: Respiratory complications will improve 06/02/2021 1422 by Jerene Pitch, RN Outcome: Adequate for Discharge 06/02/2021 1422 by Jerene Pitch, RN Outcome: Adequate for Discharge Goal: Cardiovascular complication will be avoided 06/02/2021 1422 by Jerene Pitch, RN Outcome: Adequate for Discharge 06/02/2021 1422 by Jerene Pitch, RN Outcome: Adequate for Discharge   Problem: Activity: Goal: Risk for activity intolerance will decrease 06/02/2021 1422 by Jerene Pitch, RN Outcome: Adequate for  Discharge 06/02/2021 1422 by Jerene Pitch, RN Outcome: Adequate for Discharge   Problem: Nutrition: Goal: Adequate nutrition will be maintained 06/02/2021 1422 by Jerene Pitch, RN Outcome: Adequate for Discharge 06/02/2021 1422 by Jerene Pitch, RN Outcome: Adequate for Discharge   Problem: Coping: Goal: Level of anxiety will decrease 06/02/2021 1422 by Jerene Pitch, RN Outcome: Adequate for Discharge 06/02/2021 1422 by Jerene Pitch, RN Outcome: Adequate for Discharge   Problem: Elimination: Goal: Will not experience complications related to bowel motility 06/02/2021 1422 by Jerene Pitch, RN Outcome: Adequate for Discharge 06/02/2021 1422 by Jerene Pitch, RN Outcome: Adequate for Discharge Goal: Will not experience complications related to urinary retention 06/02/2021 1422 by Jerene Pitch, RN Outcome: Adequate for Discharge 06/02/2021 1422 by Jerene Pitch, RN Outcome: Adequate for Discharge   Problem: Pain Managment: Goal: General experience of comfort will improve 06/02/2021 1422 by Jerene Pitch, RN Outcome: Adequate for Discharge 06/02/2021 1422 by Jerene Pitch, RN Outcome: Adequate for Discharge   Problem: Safety: Goal: Ability to remain free from injury will improve 06/02/2021 1422 by Jerene Pitch, RN Outcome: Adequate for Discharge 06/02/2021 1422 by Jerene Pitch, RN Outcome: Adequate for Discharge   Problem: Skin Integrity: Goal: Risk for impaired skin integrity will decrease 06/02/2021 1422 by Jerene Pitch, RN Outcome: Adequate for Discharge 06/02/2021 1422 by Jerene Pitch, RN Outcome: Adequate for Discharge   Problem: Acute Rehab OT Goals (only OT should resolve) Goal: Pt. Will Perform Lower Body Dressing Outcome: Adequate for Discharge Goal: Pt. Will Transfer To Toilet Outcome: Adequate for Discharge Goal: Pt. Will Perform Toileting-Clothing Manipulation Outcome: Adequate for Discharge Goal: OT Additional  ADL Goal #1 Outcome: Adequate for Discharge   Problem: Acute Rehab PT Goals(only PT should resolve) Goal: Patient Will Transfer Sit To/From Stand Outcome: Adequate for Discharge Goal: Pt Will  Ambulate Outcome: Adequate for Discharge Goal: Pt Will Go Up/Down Stairs Outcome: Adequate for Discharge

## 2021-06-02 NOTE — Progress Notes (Signed)
2 Days Post-Op  Subjective: No new complaints.    ROS: See above, otherwise other systems negative  Objective: Vital signs in last 24 hours: Temp:  [97.9 F (36.6 C)-98.4 F (36.9 C)] 98.4 F (36.9 C) (05/26 0417) Pulse Rate:  [92-96] 93 (05/26 0417) Resp:  [18] 18 (05/26 0417) BP: (95-108)/(64-79) 108/79 (05/26 0417) SpO2:  [99 %-100 %] 99 % (05/26 0417) Last BM Date : 05/30/21  Intake/Output from previous day: 05/25 0701 - 05/26 0700 In: 120 [P.O.:120] Out: 500 [Urine:500] Intake/Output this shift: Total I/O In: 220 [P.O.:220] Out: 200 [Urine:200]  PE: Skin: left hip wound is clean and packed.  No purulent drainage or necrotic tissue present  Lab Results:  Recent Labs    06/01/21 0415 06/02/21 0444  WBC 6.3 4.9  HGB 9.1* 8.8*  HCT 28.8* 28.1*  PLT 100* 125*   BMET Recent Labs    05/31/21 0400 05/31/21 2119  NA 138 135  K 3.9 4.4  CL 108 105  CO2 25 25  GLUCOSE 102* 159*  BUN 10 12  CREATININE 0.69 0.81  CALCIUM 8.8* 9.1   PT/INR No results for input(s): LABPROT, INR in the last 72 hours. CMP     Component Value Date/Time   NA 135 05/31/2021 2119   K 4.4 05/31/2021 2119   CL 105 05/31/2021 2119   CO2 25 05/31/2021 2119   GLUCOSE 159 (H) 05/31/2021 2119   BUN 12 05/31/2021 2119   CREATININE 0.81 05/31/2021 2119   CREATININE 0.89 05/10/2021 0933   CALCIUM 9.1 05/31/2021 2119   PROT 6.3 (L) 05/18/2021 0501   ALBUMIN 2.4 (L) 05/31/2021 2119   AST 23 05/18/2021 0501   AST 20 05/10/2021 0933   ALT 26 05/18/2021 0501   ALT 21 05/10/2021 0933   ALKPHOS 44 05/18/2021 0501   BILITOT 1.0 05/18/2021 0501   BILITOT 0.6 05/10/2021 0933   GFRNONAA >60 05/31/2021 2119   GFRNONAA >60 05/10/2021 0933   Lipase  No results found for: LIPASE     Studies/Results: No results found.  Anti-infectives: Anti-infectives (From admission, onward)    Start     Dose/Rate Route Frequency Ordered Stop   05/31/21 1245  ceFAZolin (ANCEF) IVPB 2g/100 mL  premix       Note to Pharmacy: Pharmacy may adjust dosing strength, interval, or rate of medication as needed for optimal therapy for the patient  Send with patient on call to the OR.  Anesthesia to complete antibiotic administration <21min prior to incision per [facility].   2 g 200 mL/hr over 30 Minutes Intravenous On call to O.R. 05/31/21 1240 05/31/21 1336   05/29/21 1415  cefadroxil (DURICEF) capsule 1,000 mg        1,000 mg Oral 2 times daily 05/29/21 1322 06/21/21 2359   05/21/21 1400  ceFAZolin (ANCEF) IVPB 2g/100 mL premix  Status:  Discontinued        2 g 200 mL/hr over 30 Minutes Intravenous Every 8 hours 05/21/21 0945 05/29/21 1322   05/19/21 1800  vancomycin (VANCOREADY) IVPB 1500 mg/300 mL  Status:  Discontinued        1,500 mg 150 mL/hr over 120 Minutes Intravenous Every 24 hours 05/19/21 0718 05/21/21 0944   05/18/21 1800  vancomycin (VANCOREADY) IVPB 1250 mg/250 mL  Status:  Discontinued        1,250 mg 166.7 mL/hr over 90 Minutes Intravenous Every 24 hours 05/17/21 1842 05/19/21 0718   05/18/21 1300  cefTRIAXone (ROCEPHIN) 2 g  in sodium chloride 0.9 % 100 mL IVPB  Status:  Discontinued        2 g 200 mL/hr over 30 Minutes Intravenous Every 24 hours 05/17/21 1847 05/21/21 0944   05/17/21 1300  vancomycin (VANCOREADY) IVPB 1500 mg/300 mL        1,500 mg 150 mL/hr over 120 Minutes Intravenous  Once 05/17/21 1233 05/17/21 1907   05/17/21 1215  vancomycin (VANCOCIN) IVPB 1000 mg/200 mL premix  Status:  Discontinued        1,000 mg 200 mL/hr over 60 Minutes Intravenous  Once 05/17/21 1213 05/17/21 1233   05/17/21 1215  cefTRIAXone (ROCEPHIN) 2 g in sodium chloride 0.9 % 100 mL IVPB        2 g 200 mL/hr over 30 Minutes Intravenous  Once 05/17/21 1213 05/17/21 1422        Assessment/Plan POD 2, s/p excision of L lateral thigh lesion, Dr. Johney Maine 5/24 -path pending -cx with E coli, already on abx for this secondary to his bacteremia -wound is nice and clean.  VAC  placement today -wound measures 10x6x6cm -stable for DC home today from my standpoint after VAC placed   FEN - HH VTE - on hold due to pancytopenia ID - per ID   E. Coli bacteremia Multiple nonhealing wounds HTN Lung cancer    LOS: 16 days    Henreitta Cea , [facility] 06/02/2021, 10:30 AM Please see Amion for pager number during day hours 7:00am-4:30pm or 7:00am -11:30am on weekends

## 2021-06-02 NOTE — Progress Notes (Signed)
Occupational Therapy Treatment Patient Details Name: Adam [name] MRN: [id number] DOB: [birth year] Today's Date: 06/02/2021   History of present illness 66 y.o. male with medical history significant of metastatic small cell lung cancer, HTN. Presenting with multiple falls over the last week. Today presented to his [facility] clinic for regular follow up and with concerns of left knee and left forearm after falls at home. S/P I&D 5/10, repeat I&D 5/18, and port-a-cath removal 5/19.   OT comments  Pt progressing well towards goals.  Patient requires min guard to supervision for mobility and transfers using cane, toileting with supervision and grooming with supervision.  Fatigues easily with sustained standing tasks, completing grooming seated at sink.  Continues to require up to min cueing for safety, sequencing and problem solving. Will follow acutely.    Recommendations for follow up therapy are one component of a multi-disciplinary discharge planning process, led by the attending physician.  Recommendations may be updated based on patient status, additional functional criteria and insurance authorization.    Follow Up Recommendations  Home health OT    Assistance Recommended at Discharge Frequent or constant Supervision/Assistance  Patient can return home with the following  A little help with walking and/or transfers;A little help with bathing/dressing/bathroom;Assistance with cooking/housework;Direct supervision/assist for financial management;Direct supervision/assist for medications management;Assist for transportation;Help with stairs or ramp for entrance   Equipment Recommendations  BSC/3in1    Recommendations for Other Services      Precautions / Restrictions Precautions Precautions: Fall;Other (comment) Precaution Comments: Wounds-1 in LUE and 2 in LLE Restrictions Weight Bearing Restrictions: No       Mobility Bed Mobility               General bed mobility comments: OOB in  recliner    Transfers                         Balance Overall balance assessment: Needs assistance Sitting-balance support: Feet supported, No upper extremity supported Sitting balance-Leahy Scale: Good     Standing balance support: No upper extremity supported, During functional activity Standing balance-Leahy Scale: Fair Standing balance comment: min guard for safety dynamically                           ADL either performed or assessed with clinical judgement   ADL Overall ADL's : Needs assistance/impaired     Grooming: Supervision/safety;Standing;Wash/dry hands Grooming Details (indicate cue type and reason): cueing to wash hands after toileting, cueing for use of soap as well.  stood at sink to shave, finished sitting due to fatigue.  Min cueing to sequence and problem solve (removed plastic cover from razor).                 Toilet Transfer: Min guard;Ambulation;Grab bars;Supervision/safety [employer] forces technical officer Details (indicate cue type and reason): cane Toileting- Clothing Manipulation and Hygiene: Min guard;Sitting/lateral lean;Sit to/from stand;Supervision/safety       Functional mobility during ADLs: Min guard;Supervision/safety;Cane General ADL Comments: min guard to supervision safety using cane, fatigues easily and required cueing for rest breaks    Extremity/Trunk Assessment              Vision       Perception     Praxis      Cognition Arousal/Alertness: Awake/alert Behavior During Therapy: Flat affect Overall Cognitive Status: No family/caregiver present to determine baseline cognitive functioning Area of Impairment: Safety/judgement, Following commands, Attention, Awareness,  Problem solving, Memory                   Current Attention Level: Selective Memory: Decreased short-term memory, Decreased recall of precautions Following Commands: Follows one step commands consistently, Follows one step commands with  increased time, Follows multi-step commands inconsistently Safety/Judgement: Decreased awareness of safety, Decreased awareness of deficits Awareness: Emergent Problem Solving: Slow processing, Requires verbal cues, Difficulty sequencing General Comments: pt following simple commands with increased time, cueing required for problem solving and safety. difficulty with recall, sequencing and problem solving during grooming tasks at sink.        Exercises      Shoulder Instructions       General Comments      Pertinent Vitals/ Pain       Pain Assessment Pain Assessment: No/denies pain Pain Intervention(s): Limited activity within patient's tolerance  Home Living                                          Prior Functioning/Environment              Frequency  Min 2X/week        Progress Toward Goals  OT Goals(current goals can now be found in the care plan section)  Progress towards OT goals: Progressing toward goals  Acute Rehab OT Goals Patient Stated Goal: get stronger OT Goal Formulation: With patient Time For Goal Achievement: 06/16/21 Potential to Achieve Goals: Good  Plan Discharge plan remains appropriate;Frequency remains appropriate    Co-evaluation                 AM-PAC OT "6 Clicks" Daily Activity     Outcome Measure   Help from another person eating meals?: None Help from another person taking care of personal grooming?: A Little Help from another person toileting, which includes using toliet, bedpan, or urinal?: A Little Help from another person bathing (including washing, rinsing, drying)?: A Little Help from another person to put on and taking off regular upper body clothing?: A Little Help from another person to put on and taking off regular lower body clothing?: A Little 6 Click Score: 19    End of Session Equipment Utilized During Treatment: Other (comment) (cane)  OT Visit Diagnosis: Unsteadiness on feet  (R26.81)   Activity Tolerance Patient tolerated treatment well   Patient Left in chair;with nursing/sitter in room (at sink with NT present)   Nurse Communication Mobility status        Time: 3491-7915 OT Time Calculation (min): 30 min  Charges: OT General Charges $OT Visit: 1 Visit OT Treatments $Self Care/Home Management : 23-37 mins  Jolaine Artist, OT Acute Rehabilitation Services Pager [phone] Office [phone]   Adam [name] 06/02/2021, 10:05 AM

## 2021-06-02 NOTE — TOC Transition Note (Signed)
Transition of Care [facility]) - CM/SW Discharge Note   Patient Details  Name: Adam [name] MRN: [id number] Date of Birth: 06/06/1955  [facility] CM/SW Contact:  Leeroy Cha, RN Phone Number: 06/02/2021, 2:08 PM   Clinical Narrative:    Dcc to home with wound vac adoration to change on Monday wedensday and Friday.  Ex wife is going to assist with wound care for the other sites.   Final next level of care: [facility] Barriers to Discharge: No Barriers Identified   Patient Goals and CMS Choice Patient states their goals for this hospitalization and ongoing recovery are:: To get better CMS Medicare.gov Compare Post Acute Care list provided to:: Patient Choice offered to / list presented to : Patient  Discharge Placement                       Discharge Plan and Services   Discharge Planning Services: CM Consult Post Acute Care Choice: Durable Medical Equipment, Home Health          DME Arranged: Negative pressure wound device DME Agency: [employer] Date DME Agency Contacted: 06/02/21 Time DME Agency Contacted: [phone] Representative spoke with at DME Agency: [employer]            Social Determinants of Health ([place]) Interventions     Readmission Risk Interventions     View : No data to display.

## 2021-06-05 LAB — AEROBIC/ANAEROBIC CULTURE W GRAM STAIN (SURGICAL/DEEP WOUND)

## 2021-06-06 LAB — SURGICAL PATHOLOGY

## 2021-06-07 ENCOUNTER — Other Ambulatory Visit: Payer: Managed Care, Other (non HMO)

## 2021-06-07 ENCOUNTER — Inpatient Hospital Stay: Payer: Commercial Managed Care - HMO

## 2021-06-07 ENCOUNTER — Ambulatory Visit: Payer: Managed Care, Other (non HMO) | Admitting: Physician Assistant

## 2021-06-07 ENCOUNTER — Ambulatory Visit: Payer: Managed Care, Other (non HMO)

## 2021-06-11 NOTE — Progress Notes (Signed)
[facility] OFFICE PROGRESS NOTE  de [place], Adam J, MD [address] [place] [address]  DIAGNOSIS: Metastatic small cell lung cancer initially diagnosed as Limited stage (T1c, N2, M0) small cell lung cancer diagnosed in February 2022.  PRIOR THERAPY: 1) Systemic chemotherapy according to phase 3 randomized clinical trial of chemoradiation versus chemoradiation plus atezolizumab status post 3 cycles.  This treatment was discontinued secondary to intolerance. 2) palliative radiotherapy to the left hip area under the care of Dr. Tammi Klippel completed January 03, 2021. 3) Second line systemic chemotherapy with carboplatin for AUC of 5 on day 1, etoposide 100 Mg/M2 on days 1, 2 and 3 as well as Imfinzi 1500 Mg IV on day 1 and Cosela 240 Mg/M2 before chemotherapy on days 1, 2 and 3 every 3 weeks.  First dose December 13, 2020.  Status post 5 cycles.  Starting from cycle #5 the patient will be on maintenance treatment with Imfinzi every 4 weeks.  This was discontinued secondary to disease progression  CURRENT THERAPY: Third line systemic chemotherapy with Zepzelca (lurbinectedin) 3.2 Mg/M2 IV every 3 weeks.  First dose 04/19/2021.  Status post 2 cycles.  Treatment on hold due to pancytopenia and bacteremia.  INTERVAL HISTORY: Adam [name] 66 y.o. male returns to the clinic today for a follow-up visit.  In April 2023, the patient evidence of disease progression.  Therefore, his treatment was changed to chemotherapy with Zepzelca.  After cycle #2, the patient was reporting multiple falls at home.  He was seen in the clinic for follow-up 05/17/2021 and had significant swelling on his knee and forearm.  He was subsequently sent to the emergency room for concern for sepsis due to low blood pressure.  While he was in the emergency room, the patient was severely neutropenic.  The wounds were debrided and packed and he was started on IV antibiotics.  He was found to have E. coli bacteremia.  He  was hospitalized from 05/17/2021-06/02/2021.  Also throughout the course of the hospitalization, infectious disease was consulted.  He had imaging which showed 3 superficial abscesses in the left hip, lateral knee, and proximal lower leg.  The subcutaneous mass on the left hip was excised on 05/27/2021.  He is following outpatient with general surgery and infectious disease.  He saw infectious disease yesterday.  Also throughout the course of his hospitalization, the patient had profound pancytopenia which was slow to recover.  He had 3 units of platelets while admitted. He also required G-CSF support with Granix.  By the time of discharge, his counts had improved.  Since last being seen, the patient states "actually I feel pretty good".  He is performing physical therapy twice a week.  Wound care also comes the house on Mondays, Wednesdays, and Fridays.  The patient has a wound VAC and is expected to have this for several weeks.  The patient estimates he is going to have the wound VAC for another 6 weeks or so.  He is using a cane for ambulation.  He reports his appetite is improving and he is gaining back some of the weight that he lost while in the hospital.  He reports his breathing is "not bad".  He states that shortness of breath with exertion is about the same as his baseline.  Denies any cough, hemoptysis, or chest pain.  He denies any significant weakness.  He denies any fever, chills, or night sweats.  He denies any nausea, vomiting, diarrhea, or constipation.  He  is here today for evaluation and repeat blood work before considering starting cycle #3.    MEDICAL HISTORY: Past Medical History:  Diagnosis Date   Hypertension    Lung cancer ([place])    small cell RUL    ALLERGIES:  has No Known Allergies.  MEDICATIONS:  Current Outpatient Medications  Medication Sig Dispense Refill   acetaminophen (TYLENOL) 650 MG CR tablet Take 650 mg by mouth every 8 (eight) hours as needed for pain.      bisacodyl (DULCOLAX) 5 MG EC tablet Take 5 mg by mouth daily as needed for moderate constipation.     cefadroxil (DURICEF) 500 MG capsule Take 2 capsules (1,000 mg total) by mouth 2 (two) times daily for 7 days. 28 capsule 0   collagenase (SANTYL) 250 UNIT/GM ointment Apply topically daily. 30 g 1   feeding supplement (ENSURE ENLIVE / ENSURE PLUS) LIQD Take 237 mLs by mouth 3 (three) times daily between meals. 21330 mL 0   lidocaine-prilocaine (EMLA) cream Apply 1 application topically as needed. (Patient taking differently: Apply 1 application. topically as needed (to access port).) 30 g 2   mirtazapine (REMERON) 15 MG tablet Take 2 tablets (30 mg total) by mouth at bedtime. (Patient not taking: Reported on 05/17/2021) 30 tablet 2   No current facility-administered medications for this visit.    SURGICAL HISTORY:  Past Surgical History:  Procedure Laterality Date   BRONCHIAL BRUSHINGS  02/08/2020   Procedure: BRONCHIAL BRUSHINGS;  Surgeon: Freddi Starr, MD;  Location: [facility];  Service: Pulmonary;;   BRONCHIAL NEEDLE ASPIRATION BIOPSY  02/08/2020   Procedure: BRONCHIAL NEEDLE ASPIRATION BIOPSIES;  Surgeon: Freddi Starr, MD;  Location: [facility];  Service: Pulmonary;;   INCISION AND DRAINAGE ABSCESS Left 05/31/2021   Procedure: Left Hip Excision;  Surgeon: Michael Boston, MD;  Location: [facility];  Service: General;  Laterality: Left;   IR IMAGING GUIDED PORT INSERTION  12/30/2020   IR REMOVAL TUN ACCESS W/ PORT W/O FL MOD SED  05/26/2021   KNEE SURGERY Right    SHOULDER SURGERY Left    VIDEO BRONCHOSCOPY WITH ENDOBRONCHIAL ULTRASOUND N/A 02/08/2020   Procedure: VIDEO BRONCHOSCOPY WITH ENDOBRONCHIAL ULTRASOUND;  Surgeon: Freddi Starr, MD;  Location: [facility];  Service: Pulmonary;  Laterality: N/A;    REVIEW OF SYSTEMS:   Review of Systems  Constitutional: Positive for weight loss during hospitalization.  Positive for improving appetite.  Positive for continued but  improving fatigue.  Negative for chills and fever. HENT: Negative for mouth sores, nosebleeds, sore throat and trouble swallowing.   Eyes: Negative for eye problems and icterus.  Respiratory: Positive for baseline dyspnea exertion.  Negative for cough, hemoptysis,  and wheezing.   Cardiovascular: Negative for chest pain and leg swelling.  Gastrointestinal: Negative for abdominal pain, constipation, diarrhea, nausea and vomiting.  Genitourinary: Negative for bladder incontinence, difficulty urinating, dysuria, frequency and hematuria.   Musculoskeletal: Negative for back pain, gait problem, neck pain and neck stiffness.  Skin: Positive for wounds on leg, hip, and forearm/elbow.  Neurological: Negative for dizziness, extremity weakness, gait problem, headaches, light-headedness and seizures.  Hematological: Negative for adenopathy. Does not bruise/bleed easily.  Psychiatric/Behavioral: Negative for confusion, depression and sleep disturbance. The patient is not nervous/anxious.     PHYSICAL EXAMINATION:  Blood pressure 136/88, pulse (!) 113, temperature (!) 96.5 F (35.8 C), temperature source Tympanic, resp. rate 17, weight 157 lb 8 oz (71.4 kg), SpO2 98 %.  ECOG PERFORMANCE STATUS: 1-2  Physical Exam  Constitutional: Oriented to person, place, and time and thin appearing male.  HENT:  Head: Normocephalic and atraumatic.  Mouth/Throat: Oropharynx is clear and moist. No oropharyngeal exudate.  Eyes: Conjunctivae are normal. Right eye exhibits no discharge. Left eye exhibits no discharge. No scleral icterus.  Neck: Normal range of motion. Neck supple.  Cardiovascular: Tachycardic, regular rhythm, normal heart sounds and intact distal pulses.   Pulmonary/Chest: Effort normal and breath sounds normal. No respiratory distress. No wheezes. No rales.  Abdominal: Soft. Bowel sounds are normal. Exhibits no distension and no mass. There is no tenderness.  Musculoskeletal: Normal range of motion.  Exhibits no edema.  Lymphadenopathy:    No cervical adenopathy.  Neurological: Alert and oriented to person, place, and time. Exhibits muscle wasting. gait normal. Coordination normal.  Ambulates with a cane.  Skin: Wounds covered with bandages per wound care.  Skin is warm and dry. Not diaphoretic. No pallor.  Psychiatric: Mood, memory and judgment normal.  Vitals reviewed.  LABORATORY DATA: Lab Results  Component Value Date   WBC 3.8 ([name]) 06/15/2021   HGB 10.5 ([name]) 06/15/2021   HCT 33.8 ([name]) 06/15/2021   MCV 92.9 06/15/2021   PLT 232 06/15/2021      Chemistry      Component Value Date/Time   NA 140 06/15/2021 1036   K 3.8 06/15/2021 1036   CL 106 06/15/2021 1036   CO2 28 06/15/2021 1036   BUN 10 06/15/2021 1036   CREATININE 0.84 06/15/2021 1036      Component Value Date/Time   CALCIUM 9.9 06/15/2021 1036   ALKPHOS 80 06/15/2021 1036   AST 17 06/15/2021 1036   ALT 8 06/15/2021 1036   BILITOT 0.4 06/15/2021 1036       RADIOGRAPHIC STUDIES:  CT FEMUR LEFT W CONTRAST  Result Date: 05/30/2021 CLINICAL DATA:  Left lateral thigh/knee wound. Left knee soft tissue abscess incision and drainage in the ER 2 weeks ago. History of lung cancer. EXAM: CT OF THE LOWER LEFT EXTREMITY WITH CONTRAST TECHNIQUE: Multidetector CT imaging of the left femur and knee was performed according to the standard protocol following intravenous contrast administration. RADIATION DOSE REDUCTION: This exam was performed according to the departmental dose-optimization program which includes automated exposure control, adjustment of the mA and/or kV according to patient size and/or use of iterative reconstruction technique. CONTRAST:  179mL OMNIPAQUE IOHEXOL 300 MG/ML  SOLN COMPARISON:  Left knee x-rays dated May 17, 2021. CT abdomen pelvis dated April 07, 2021. FINDINGS: Bones/Joint/Cartilage Lytic lesions in the left pubic rami and ischial tuberosity again noted with subacute pathologic fracture of the left  superior pubic ramus and parasymphyseal pubic bone, similar to prior. Mild left hip osteoarthritis. No hip or knee joint effusion. Ligaments Ligaments are suboptimally evaluated by CT. Muscles and Tendons Grossly intact. Soft tissue Superficial 2.8 x 3.9 x 3.8 cm rim enhancing fluid collection in the posterolateral left hip with overlying prominent skin thickening (series 6, image 72). Slightly more superiorly and deep along the superficial fascia, there is a 2.1 x 1.1 x 2.1 cm rim enhancing fluid collection (series 6, image 63). Superficial 5.5 x 1.1 x 4.5 cm rim enhancing fluid collection in the lateral knee draining to the skin surface (series 6, image 331). Superficial 1.7 x 1.0 x 2.5 cm rim enhancing fluid collection in the lateral proximal lower leg (series 6, image 363). Prominent skin thickening overlying both fluid collections. No soft tissue mass. IMPRESSION: 1. Three superficial abscesses in the left hip, lateral knee, and  proximal lower leg as described above. 2. Smaller 2.1 cm deep abscess along the superficial fascia overlying the left greater trochanter. 3. Unchanged left hemipelvis osseous metastatic disease with pathologic fracture of the left superior pubic ramus. Electronically Signed   By: Titus Dubin M.D.   On: 05/30/2021 12:30   CT KNEE LEFT W CONTRAST  Result Date: 05/30/2021 CLINICAL DATA:  Left lateral thigh/knee wound. Left knee soft tissue abscess incision and drainage in the ER 2 weeks ago. History of lung cancer. EXAM: CT OF THE LOWER LEFT EXTREMITY WITH CONTRAST TECHNIQUE: Multidetector CT imaging of the left femur and knee was performed according to the standard protocol following intravenous contrast administration. RADIATION DOSE REDUCTION: This exam was performed according to the departmental dose-optimization program which includes automated exposure control, adjustment of the mA and/or kV according to patient size and/or use of iterative reconstruction technique. CONTRAST:   169mL OMNIPAQUE IOHEXOL 300 MG/ML  SOLN COMPARISON:  Left knee x-rays dated May 17, 2021. CT abdomen pelvis dated April 07, 2021. FINDINGS: Bones/Joint/Cartilage Lytic lesions in the left pubic rami and ischial tuberosity again noted with subacute pathologic fracture of the left superior pubic ramus and parasymphyseal pubic bone, similar to prior. Mild left hip osteoarthritis. No hip or knee joint effusion. Ligaments Ligaments are suboptimally evaluated by CT. Muscles and Tendons Grossly intact. Soft tissue Superficial 2.8 x 3.9 x 3.8 cm rim enhancing fluid collection in the posterolateral left hip with overlying prominent skin thickening (series 6, image 72). Slightly more superiorly and deep along the superficial fascia, there is a 2.1 x 1.1 x 2.1 cm rim enhancing fluid collection (series 6, image 63). Superficial 5.5 x 1.1 x 4.5 cm rim enhancing fluid collection in the lateral knee draining to the skin surface (series 6, image 331). Superficial 1.7 x 1.0 x 2.5 cm rim enhancing fluid collection in the lateral proximal lower leg (series 6, image 363). Prominent skin thickening overlying both fluid collections. No soft tissue mass. IMPRESSION: 1. Three superficial abscesses in the left hip, lateral knee, and proximal lower leg as described above. 2. Smaller 2.1 cm deep abscess along the superficial fascia overlying the left greater trochanter. 3. Unchanged left hemipelvis osseous metastatic disease with pathologic fracture of the left superior pubic ramus. Electronically Signed   By: Titus Dubin M.D.   On: 05/30/2021 12:30   IR REMOVAL TUN ACCESS W/ PORT W/O FL MOD SED  Result Date: 05/26/2021 INDICATION: 66 year old with lung cancer and bacteremia. Request for Port-A-Cath removal. EXAM: REMOVAL OF RIGHT CHEST PORT-A-CATH MEDICATIONS: % lidocaine ANESTHESIA/SEDATION: None FLUOROSCOPY: None COMPLICATIONS: None immediate. PROCEDURE: Informed written consent was obtained from the patient after a thorough  discussion of the procedural risks, benefits and alternatives. All questions were addressed. Maximal Sterile Barrier Technique was utilized including caps, mask, sterile gowns, sterile gloves, sterile drape, hand hygiene and skin antiseptic. A timeout was performed prior to the initiation of the procedure. The right chest was prepped and draped in a sterile fashion. Lidocaine was utilized for local anesthesia. An incision was made over the previously healed surgical incision. Utilizing blunt dissection, the port catheter and reservoir were removed from the underlying subcutaneous tissue in their entirety. The pocket was irrigated with a copious amount of sterile normal saline. The subcutaneous tissue was closed with 3-0 Vicryl interrupted subcutaneous stitches. A 4-0 Vicryl running subcuticular stitch was utilized to approximate the skin. Dermabond was applied. FINDINGS: Normal appearance of the skin site. Port pocket looked healthy without signs of infection. IMPRESSION:  Successful removal of the right chest Port-A-Cath. Electronically Signed   By: Markus Daft M.D.   On: 05/26/2021 17:30   DG Forearm Left  Result Date: 05/17/2021 CLINICAL DATA:  Left forearm wound after fall several weeks ago. EXAM: LEFT FOREARM - 2 VIEW COMPARISON:  None Available. FINDINGS: There is no evidence of fracture or other focal bone lesions. Large amount of soft tissue gas is seen around proximal forearm. Some high density material is noted within the soft tissues which may represent either surgical packing or other foreign body. IMPRESSION: No fracture or dislocation is noted. Large amount of soft tissue gas seen in proximal forearm consistent with laceration or other wounds. High density material is seen in these areas which may represent surgical packing or other foreign body; clinical correlation is recommended Electronically Signed   By: Marijo Conception M.D.   On: 05/17/2021 20:41   DG Knee 1-2 Views Left  Result Date:  05/17/2021 CLINICAL DATA:  Left knee wound after fall. EXAM: LEFT KNEE - 1-2 VIEW COMPARISON:  None Available. FINDINGS: No evidence of fracture, dislocation, or joint effusion. No evidence of arthropathy. Mild patellar spurring is noted. Vascular calcifications are noted. Soft tissue gas is noted lateral to the joint space consistent with laceration. IMPRESSION: Soft tissue gas noted lateral to the joint space consistent with laceration. No fracture or dislocation is noted. Electronically Signed   By: Marijo Conception M.D.   On: 05/17/2021 20:38   US RENAL  Result Date: 05/17/2021 CLINICAL DATA:  Acute renal insufficiency EXAM: RENAL / URINARY TRACT ULTRASOUND COMPLETE COMPARISON:  CT 04/07/2021 FINDINGS: Right Kidney: Renal measurements: 10.9 x 4.9 x 4.6 cm = volume: 129 mL. Echogenicity within normal limits. No mass or hydronephrosis visualized. Left Kidney: Renal measurements: 10.1 x 5.8 x 4.8 cm = volume: 146 mL. Echogenicity within normal limits. No mass or hydronephrosis visualized. 9 mm nonobstructing calculus noted within the interpolar region of the left kidney. Bladder: Appears normal for degree of bladder distention. Bilateral ureteral jets are identified Other: None. IMPRESSION: Stable left nonobstructing nephrolithiasis. Otherwise unremarkable examination. Electronically Signed   By: Fidela Salisbury M.D.   On: 05/17/2021 19:26   DG Chest 2 View  Result Date: 05/17/2021 CLINICAL DATA:  Suspected sepsis.  History of lung cancer. EXAM: CHEST - 2 VIEW COMPARISON:  04/07/2021 CT FINDINGS: Right Port-A-Cath tip at superior caval/atrial junction. Numerous leads and wires project over the chest. Tracheal deviation right. Right hemidiaphragm elevation. Normal heart size. No pleural fluid. 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 Radiation change within the right suprahilar region and paramediastinal lung. No superimposed lobar  consolidation. IMPRESSION: No evidence of pneumonia. 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 Emphysema (ICD10-J43.9). Electronically Signed   By: Abigail Miyamoto M.D.   On: 05/17/2021 12:12     ASSESSMENT/PLAN:  This is a very pleasant 66 year old African-American male who was initially diagnosed with limited stage small cell lung cancer in February 2022. The patient had evidence of disease recurrence and extensive stage small cell lung cancer and resumed treatment in December 2022   The patient initially underwent treatment with NRG [id number] clinical trial where the patient would be randomized to treatment with concurrent chemoradiation with cisplatin and/or etoposide versus concurrent chemoradiation with the same regimen in addition to atezolizumab. He is status post 3 cycles of the treatment.  His treatment was discontinued secondary to  intolerance and the patient had been in observation from April 2022-December 2022.   Repeat CT scan of the chest, abdomen, pelvis showed evidence of disease progression with a new mass centered in the superior aspect of the right major fissure, widespread nodularity in the adjacent portion of the upper right lung, multiple new pleural-based lesions in the right hemithorax, new middle mediastinal/lymphadenopathy, new hypervascular lesion in the proximal body of the pancreas and a new lytic lesions in the parasymphyseal region of the left to pubic bone.   His insurance declined a staging PET scan.   Therefore, Dr. Julien Nordmann started the patient on palliative systemic chemotherapy with carboplatin for an AUC of 5 on day 1, etoposide 100 mg per metered squared on days 1, 2, and 3 with Cosela for myelosuppression in addition to immunotherapy with Imfinzi 1500 mg.  He status post 5 cycles.  He tolerated it fairly well except he had decreased p.o. intake and weight loss.  Starting from cycle #5, the patient started maintenance  immunotherapy with Imfinzi 1500 mg IV every 4 weeks.  The patient also completed palliative radiation to the left hip under the care of Dr. Tammi Klippel.  This was completed on 01/03/2021  The patient recently had a restaging CT scan performed in April 2023.  The scan showed evidence of disease progression with interval enlargement of a bulky low left hilar or periaortic lymph node or soft tissue mass, as well as smaller lymph nodes within the superior mediastinum. There was also interval enlargement of multiple right-sided masses and slightly increased right pleural effusion  The patient then started on treatment with chemotherapy with subsequent 3.2 milligrams per kilogram IV every 3 weeks.  The patient status post 2 cycles.  Starting from cycle #2, the patient was hospitalized from 05/17/2021 to 06/02/2021.  He was hospitalized for abscesses, bacteremia, falls, and pancytopenia.  The patient is being followed closely by wound care, infectious disease, and general surgery.  The patient was seen with Dr. Julien Nordmann today.  Dr. Julien Nordmann had a lengthy discussion with the patient today about his current condition.  Dr. Julien Nordmann does not feel comfortable proceeding with treatment today while the patient has active infection/wounds.  The patient has a wound VAC which she is expected to have for at least 6 weeks per patient report.  Dr. Julien Nordmann recommends delaying treatment and reevaluating him in 3 weeks for repeat blood work before considering restarting treatment.  The patient is tachycardic today with a pulse of 113.  The patient was offered IV fluids but he declined.  He states he is able to hydrate well p.o. at home.  Follow-up outpatient with general surgery, wound care, infectious disease  The patient was advised to call immediately if she has any concerning symptoms in the interval. The patient voices understanding of current disease status and treatment options and is in agreement with the current  care plan. All questions were answered. The patient knows to call the clinic with any problems, questions or concerns. We can certainly see the patient much sooner if necessary               No orders of the defined types were placed in this encounter.    Adam [name] Orpha Dain, PA-C 06/15/21  ADDENDUM: Hematology/Oncology Attending: I had a face-to-face encounter with the patient today.  I reviewed his records, lab, imaging studies and recommended his care plan.  This is a very pleasant 66 years old African-American male with metastatic small cell lung cancer that  was initially diagnosed as limited stage disease in February 2022 status post systemic chemotherapy with cisplatin and etoposide +/- atezolizumab status post 3 cycles discontinued secondary to intolerance.  This was concurrent with radiotherapy.  The patient had evidence for disease progression and he underwent palliative radiotherapy to the left hip area under the care of Dr. Tammi Klippel completed in December 2022.  He was then treated with second line systemic chemotherapy with carboplatin, etoposide and Imfinzi for 4 cycles followed by 1 cycle of maintenance treatment with Imfinzi but this treatment was discontinued secondary to disease progression.  He is currently undergoing third line systemic therapy with Zepzelca (lurbinectedin) status post 2 cycles. The patient was admitted after cycle #2 to the hospital with significant pancytopenia and bacteremia and he also developed several abscesses in the left hip and thigh area.  He was just recently discharged from the hospital less than 2 weeks ago.  The patient is not feeling well and he continues to have significant fatigue and weakness.  He also has loss of appetite and weight loss.  He also has a wound VAC I recommended for the patient to hold his systemic treatment for now until improvement of his condition.  We will see him back for follow-up visit in around 3 weeks for  evaluation before resuming his treatment at a reduced dose of zepzelca 2.6 mg/M2 starting from the next cycle of his treatment. For the tachycardia and poor p.o. intake, I will arrange for the patient to receive 1 [name] of normal saline in the clinic today. The patient was advised to call immediately if he has any other concerning symptoms in the interval. The total time spent in the appointment was 30 minutes.  Disclaimer: This note was dictated with voice recognition software. Similar sounding words can inadvertently be transcribed and may be missed upon review. Eilleen Kempf, MD

## 2021-06-14 ENCOUNTER — Ambulatory Visit (INDEPENDENT_AMBULATORY_CARE_PROVIDER_SITE_OTHER): Payer: Commercial Managed Care - HMO | Admitting: Internal Medicine

## 2021-06-14 ENCOUNTER — Encounter: Payer: Self-pay | Admitting: Internal Medicine

## 2021-06-14 ENCOUNTER — Other Ambulatory Visit: Payer: Self-pay

## 2021-06-14 ENCOUNTER — Inpatient Hospital Stay: Payer: Commercial Managed Care - HMO

## 2021-06-14 VITALS — BP 127/84 | HR 108 | Temp 97.7°F | Wt 155.0 lb

## 2021-06-14 DIAGNOSIS — B9620 Unspecified Escherichia coli [E. coli] as the cause of diseases classified elsewhere: Secondary | ICD-10-CM

## 2021-06-14 DIAGNOSIS — R7881 Bacteremia: Secondary | ICD-10-CM

## 2021-06-14 DIAGNOSIS — A4151 Sepsis due to Escherichia coli [E. coli]: Secondary | ICD-10-CM | POA: Diagnosis not present

## 2021-06-14 DIAGNOSIS — L03116 Cellulitis of left lower limb: Secondary | ICD-10-CM | POA: Diagnosis not present

## 2021-06-14 DIAGNOSIS — L03114 Cellulitis of left upper limb: Secondary | ICD-10-CM

## 2021-06-14 DIAGNOSIS — N179 Acute kidney failure, unspecified: Secondary | ICD-10-CM

## 2021-06-14 DIAGNOSIS — R6520 Severe sepsis without septic shock: Secondary | ICD-10-CM

## 2021-06-14 MED ORDER — CEFADROXIL 500 MG PO CAPS: 1000.0000 mg | ORAL_CAPSULE | Freq: Two times a day (BID) | ORAL | 0 refills | Status: AC

## 2021-06-14 NOTE — Assessment & Plan Note (Addendum)
Patient is doing well currently on cefadroxil 1gm BID.  Will plan for an additional week of treatment from the date of his last procedure to complete 4 weeks total.  End date 06/28/21 and I sent in an extra week of antibiotics to his pharmacy to have on hand.  He has follow up with orthopedics and general surgery already scheduled as well as lab visits in the next 1-2 weeks.  He will follow up with [place] once more about 1 week after finishing antibiotics for clinical re-evaluation.  Discussed that will need continued wound care after finishing therapy to promote ongoing healing.

## 2021-06-14 NOTE — Progress Notes (Signed)
[facility] for Infectious Disease  CHIEF COMPLAINT:    Follow up for E coli infection  SUBJECTIVE:    Adam [name] is a 66 y.o. male with PMHx as below who presents to the clinic for E coli infection.   Patient is coming off of a prolonged admission at [facility] from 5/10-5/26 when admitted for sepsis due to E coli bacteremia due to left forearm and leg lower extremity cellulitis/abscess.   Patient underwent bedside I&D of left arm in the ED on 5/10 and with orthopedics on 5/18.  Cultures grew pan-sensitive E coli.  Status post additional excisional debridement of left hip tissue mass on 5/24 with general surgery.  Cultures also grew E coli and pathology showed fat necrosis with abscess.  Patient also had his Port A Cath removed 5/19.  He was seen by ID while admitted and recommended to complete cefadroxil 1gm BID x 3 weeks from date of last procedure (5/24-6/14).  His neutropenia resolved while in the hospital and he presents today for follow.  He reports today that he is doing well.  He has been tolerating Cefadroxil 1gm BID without issues.  No fevers, chills.  His wounds are healing well with wound vac on left hip and wound care with other areas.   Please see A&P for the details of today's visit and status of the patient's medical problems.   Patient's Medications  New Prescriptions   No medications on file  Previous Medications   ACETAMINOPHEN (TYLENOL) 650 MG CR TABLET    Take 650 mg by mouth every 8 (eight) hours as needed for pain.   BISACODYL (DULCOLAX) 5 MG EC TABLET    Take 5 mg by mouth daily as needed for moderate constipation.   COLLAGENASE (SANTYL) 250 UNIT/GM OINTMENT    Apply topically daily.   FEEDING SUPPLEMENT (ENSURE ENLIVE / ENSURE PLUS) LIQD    Take 237 mLs by mouth 3 (three) times daily between meals.   LIDOCAINE-PRILOCAINE (EMLA) CREAM    Apply 1 application topically as needed.   MIRTAZAPINE (REMERON) 15 MG TABLET    Take 2 tablets (30 mg total)  by mouth at bedtime.  Modified Medications   Modified Medication Previous Medication   CEFADROXIL (DURICEF) 500 MG CAPSULE cefadroxil (DURICEF) 500 MG capsule      Take 2 capsules (1,000 mg total) by mouth 2 (two) times daily for 7 days.    Take 2 capsules (1,000 mg total) by mouth 2 (two) times daily for 19 days.  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[place])    small cell RUL    Social History   Tobacco Use   Smoking status: Former    Packs/day: 1.00    Years: 20.00    Pack years: 20.00    Types: Cigarettes    Quit date: 06/09/2018    Years since quitting: 3.0   Smokeless tobacco: Never  Vaping Use   Vaping Use: Never used  Substance Use Topics   Alcohol use: Not Currently   Drug use: Never    Family History  Problem Relation Age of Onset   Heart disease Mother    Cancer Cousin    Breast cancer Neg Hx    Colon cancer Neg Hx    Pancreatic cancer Neg Hx    Prostate cancer Neg Hx  No Known Allergies  Review of Systems  All other systems reviewed and are negative.  Except as noted above in HPI.    OBJECTIVE:    Vitals:   06/14/21 1529  BP: 127/84  Pulse: (!) 108  Temp: 97.7 F (36.5 C)  TempSrc: Oral  SpO2: 100%  Weight: 155 lb (70.3 kg)   Body mass index is 22.89 kg/m.  Physical Exam Constitutional:      General: He is not in acute distress.    Appearance: Normal appearance.  HENT:     Head: Normocephalic and atraumatic.  Pulmonary:     Effort: Pulmonary effort is normal. No respiratory distress.  Abdominal:     General: There is no distension.     Palpations: Abdomen is soft.  Musculoskeletal:     Comments: Wound vac is in place.  Images on his phone reviewed and appear to be healing well with good granulation tissue.   Skin:    General: Skin is warm and dry.     Findings: No rash.  Neurological:     General: No focal deficit present.     Mental  Status: He is alert and oriented to person, place, and time.  Psychiatric:        Mood and Affect: Mood normal.        Behavior: Behavior normal.     Labs and Microbiology:    Latest Ref Rng & Units 06/02/2021    4:44 AM 06/01/2021    4:15 AM 05/31/2021    4:00 AM  CBC  WBC 4.0 - 10.5 K/uL 4.9   6.3   5.7    Hemoglobin 13.0 - 17.0 g/dL 8.8   9.1   9.0    Hematocrit 39.0 - 52.0 % 28.1   28.8   28.3    Platelets 150 - 400 K/uL 125   100   72        Latest Ref Rng & Units 05/31/2021    9:19 PM 05/31/2021    4:00 AM 05/30/2021    3:47 AM  CMP  Glucose 70 - 99 mg/dL 159   102   102    BUN 8 - 23 mg/dL 12   10   13     Creatinine 0.61 - 1.24 mg/dL 0.81   0.69   0.72    Sodium 135 - 145 mmol/L 135   138   138    Potassium 3.5 - 5.1 mmol/L 4.4   3.9   4.0    Chloride 98 - 111 mmol/L 105   108   107    CO2 22 - 32 mmol/L 25   25   26     Calcium 8.9 - 10.3 mg/dL 9.1   8.8   8.6         ASSESSMENT & PLAN:    Sepsis due to E. coli bacteremia, left forearm and left lower extremity cellulitis/abscess Patient is doing well currently on cefadroxil 1gm BID.  Will plan for an additional week of treatment from the date of his last procedure to complete 4 weeks total.  End date 06/28/21 and I sent in an extra week of antibiotics to his pharmacy to have on hand.  He has follow up with orthopedics and general surgery already scheduled as well as lab visits in the next 1-2 weeks.  He will follow up with [place] once more about 1 week after finishing antibiotics for clinical re-evaluation.  Discussed that will need continued wound care after  finishing therapy to promote ongoing healing.      Raynelle [facility] for Infectious [facility] 06/14/2021, 3:58 PM   I have personally spent 40 minutes involved in face-to-face and non-face-to-face activities for this patient on the day of the visit. Professional time spent includes the following activities: Preparing to see the  patient (review of tests), Obtaining and/or reviewing separately obtained history (admission/discharge record), Performing a medically appropriate examination and/or evaluation , Ordering medications/tests/procedures, referring and communicating with other health care professionals, Documenting clinical information in the EMR, Independently interpreting results (not separately reported), Communicating results to the patient/family/caregiver, Counseling and educating the patient/family/caregiver and Care coordination (not separately reported).

## 2021-06-14 NOTE — Patient Instructions (Signed)
Thank you for coming to see me today. It was a pleasure seeing you.  To Do: Keep taking cefadroxil 1gm twice daily I gave you an extra week of antibiotics today to your pharmacy Follow up in about 3-4 weeks with me or Dr Gale Journey  If you have any questions or concerns, please do not hesitate to call the office at [phone].  Take Care,   Jule Ser

## 2021-06-15 ENCOUNTER — Other Ambulatory Visit: Payer: Self-pay

## 2021-06-15 ENCOUNTER — Inpatient Hospital Stay: Payer: Commercial Managed Care - HMO

## 2021-06-15 ENCOUNTER — Inpatient Hospital Stay: Payer: Commercial Managed Care - HMO | Admitting: Nutrition

## 2021-06-15 ENCOUNTER — Inpatient Hospital Stay (HOSPITAL_BASED_OUTPATIENT_CLINIC_OR_DEPARTMENT_OTHER): Payer: Commercial Managed Care - HMO | Admitting: Physician Assistant

## 2021-06-15 ENCOUNTER — Inpatient Hospital Stay: Payer: Commercial Managed Care - HMO | Attending: Hematology and Oncology

## 2021-06-15 VITALS — BP 136/88 | HR 113 | Temp 96.5°F | Resp 17 | Wt 157.5 lb

## 2021-06-15 DIAGNOSIS — T451X5A Adverse effect of antineoplastic and immunosuppressive drugs, initial encounter: Secondary | ICD-10-CM | POA: Insufficient documentation

## 2021-06-15 DIAGNOSIS — C3411 Malignant neoplasm of upper lobe, right bronchus or lung: Secondary | ICD-10-CM | POA: Insufficient documentation

## 2021-06-15 DIAGNOSIS — L02416 Cutaneous abscess of left lower limb: Secondary | ICD-10-CM | POA: Insufficient documentation

## 2021-06-15 DIAGNOSIS — D61810 Antineoplastic chemotherapy induced pancytopenia: Secondary | ICD-10-CM | POA: Insufficient documentation

## 2021-06-15 DIAGNOSIS — Z923 Personal history of irradiation: Secondary | ICD-10-CM | POA: Insufficient documentation

## 2021-06-15 DIAGNOSIS — C7951 Secondary malignant neoplasm of bone: Secondary | ICD-10-CM | POA: Insufficient documentation

## 2021-06-15 LAB — CBC WITH DIFFERENTIAL (CANCER CENTER ONLY)
Abs Immature Granulocytes: 0.04 10*3/uL (ref 0.00–0.07)
Basophils Absolute: 0 10*3/uL (ref 0.0–0.1)
Basophils Relative: 1 %
Eosinophils Absolute: 0.1 10*3/uL (ref 0.0–0.5)
Eosinophils Relative: 1 %
HCT: 33.8 % — ABNORMAL LOW (ref 39.0–52.0)
Hemoglobin: 10.5 g/dL — ABNORMAL LOW (ref 13.0–17.0)
Immature Granulocytes: 1 %
Lymphocytes Relative: 17 %
Lymphs Abs: 0.6 10*3/uL — ABNORMAL LOW (ref 0.7–4.0)
MCH: 28.8 pg (ref 26.0–34.0)
MCHC: 31.1 g/dL (ref 30.0–36.0)
MCV: 92.9 fL (ref 80.0–100.0)
Monocytes Absolute: 0.6 10*3/uL (ref 0.1–1.0)
Monocytes Relative: 15 %
Neutro Abs: 2.5 10*3/uL (ref 1.7–7.7)
Neutrophils Relative %: 65 %
Platelet Count: 232 10*3/uL (ref 150–400)
RBC: 3.64 MIL/uL — ABNORMAL LOW (ref 4.22–5.81)
RDW: 19 % — ABNORMAL HIGH (ref 11.5–15.5)
WBC Count: 3.8 10*3/uL — ABNORMAL LOW (ref 4.0–10.5)
nRBC: 0 % (ref 0.0–0.2)

## 2021-06-15 LAB — CMP (CANCER CENTER ONLY)
ALT: 8 U/L (ref 0–44)
AST: 17 U/L (ref 15–41)
Albumin: 3.4 g/dL — ABNORMAL LOW (ref 3.5–5.0)
Alkaline Phosphatase: 80 U/L (ref 38–126)
Anion gap: 6 (ref 5–15)
BUN: 10 mg/dL (ref 8–23)
CO2: 28 mmol/L (ref 22–32)
Calcium: 9.9 mg/dL (ref 8.9–10.3)
Chloride: 106 mmol/L (ref 98–111)
Creatinine: 0.84 mg/dL (ref 0.61–1.24)
GFR, Estimated: >60 mL/min (ref >60)
Glucose, Bld: 110 mg/dL — ABNORMAL HIGH (ref 70–99)
Potassium: 3.8 mmol/L (ref 3.5–5.1)
Sodium: 140 mmol/L (ref 135–145)
Total Bilirubin: 0.4 mg/dL (ref 0.3–1.2)
Total Protein: 7.5 g/dL (ref 6.5–8.1)

## 2021-06-16 ENCOUNTER — Encounter: Payer: Self-pay | Admitting: Internal Medicine

## 2021-06-21 ENCOUNTER — Inpatient Hospital Stay: Payer: Commercial Managed Care - HMO

## 2021-06-21 ENCOUNTER — Other Ambulatory Visit: Payer: Managed Care, Other (non HMO)

## 2021-06-21 ENCOUNTER — Ambulatory Visit: Payer: Managed Care, Other (non HMO)

## 2021-06-21 ENCOUNTER — Telehealth: Payer: Self-pay | Admitting: Internal Medicine

## 2021-06-21 ENCOUNTER — Encounter: Payer: Managed Care, Other (non HMO) | Admitting: Dietician

## 2021-06-21 ENCOUNTER — Ambulatory Visit: Payer: Managed Care, Other (non HMO) | Admitting: Internal Medicine

## 2021-06-21 NOTE — Telephone Encounter (Signed)
Patient received updated calender of upcoming scheduled appointments.

## 2021-06-22 ENCOUNTER — Ambulatory Visit (INDEPENDENT_AMBULATORY_CARE_PROVIDER_SITE_OTHER): Payer: Commercial Managed Care - HMO | Admitting: Family Medicine

## 2021-06-22 ENCOUNTER — Encounter (HOSPITAL_BASED_OUTPATIENT_CLINIC_OR_DEPARTMENT_OTHER): Payer: Self-pay | Admitting: Family Medicine

## 2021-06-22 VITALS — BP 141/92 | HR 95 | Ht 69.0 in | Wt 161.2 lb

## 2021-06-22 DIAGNOSIS — B9620 Unspecified Escherichia coli [E. coli] as the cause of diseases classified elsewhere: Secondary | ICD-10-CM | POA: Diagnosis not present

## 2021-06-22 DIAGNOSIS — R7881 Bacteremia: Secondary | ICD-10-CM | POA: Diagnosis not present

## 2021-06-22 NOTE — Progress Notes (Signed)
    Procedures performed today:    None.  Independent interpretation of notes and tests performed by another provider:   None.  Brief History, Exam, Impression, and Recommendations:    BP (!) 141/92   Pulse 95   Ht 5\' 9"  (1.753 m)   Wt 161 lb 3.2 oz (73.1 kg)   SpO2 100%   BMI 23.81 kg/m   E coli bacteremia Patient presenting for follow-up from recent hospitalization due to soft tissue skin infection which had spread and resulted in bacteremia and eventually sepsis.  He was admitted for about 2 weeks related to this issue.  He did have subsequent procedures related to this as well.  At discharge, he was continued on IV antibiotics and follow-up with infectious disease.  He has also had follow-up with general surgeon due to I&D of abscesses.  He currently also has wound VAC in place, does have home health assisting with management of this.  Patient also continues follow-up with oncologist.  Since discharge he has followed up with ID, general surgery, oncology.  He was also recommended to have labs completed as an outpatient and recently had CBC and CMP completed through oncology.  These recent labs are generally reassuring when reviewed against labs around time of discharge from the hospital. Overall, patient indicates that he is doing well at present.  In the office today, he appears to be in no acute distress, conversing normally, no dyspnea during visit.  Cardiovascular exam with regular rate and rhythm, lungs clear to auscultation bilaterally Feel the patient has been doing well since discharge, he does continue regular follow-up with specialist as scheduled.  Recommend continuing with upcoming appointments with oncology as well as infectious disease. We will plan for follow-up in about 3 months to monitor progress or sooner as needed  Return in about 3 months (around 09/22/2021) for Follow-up.   ___________________________________________ Hiromi Knodel de [place], MD, ABFM, [facility] and [place]

## 2021-06-22 NOTE — Assessment & Plan Note (Signed)
Patient presenting for follow-up from recent hospitalization due to soft tissue skin infection which had spread and resulted in bacteremia and eventually sepsis.  He was admitted for about 2 weeks related to this issue.  He did have subsequent procedures related to this as well.  At discharge, he was continued on IV antibiotics and follow-up with infectious disease.  He has also had follow-up with general surgeon due to I&D of abscesses.  He currently also has wound VAC in place, does have home health assisting with management of this.  Patient also continues follow-up with oncologist.  Since discharge he has followed up with ID, general surgery, oncology.  He was also recommended to have labs completed as an outpatient and recently had CBC and CMP completed through oncology.  These recent labs are generally reassuring when reviewed against labs around time of discharge from the hospital. Overall, patient indicates that he is doing well at present.  In the office today, he appears to be in no acute distress, conversing normally, no dyspnea during visit.  Cardiovascular exam with regular rate and rhythm, lungs clear to auscultation bilaterally Feel the patient has been doing well since discharge, he does continue regular follow-up with specialist as scheduled.  Recommend continuing with upcoming appointments with oncology as well as infectious disease. We will plan for follow-up in about 3 months to monitor progress or sooner as needed

## 2021-06-26 ENCOUNTER — Telehealth: Payer: Self-pay

## 2021-06-26 NOTE — Telephone Encounter (Signed)
Pt's sister called requesting information on current status of treatment and information regarding prognosis. Attempted to call pt back three times but no answer and unable to leave message.

## 2021-06-27 ENCOUNTER — Telehealth: Payer: Self-pay

## 2021-06-27 NOTE — Telephone Encounter (Signed)
Sister called to discuss status of patient and prognosis. Family understands that patient has metastatic cancer and that his recent hospitalization and subsequent time off from chemo related to wound healing may play a factor into potential for further tx. Explained our palliative care team at [facility] and benefit of having a goals of care discussion. Patient scheduled to see Lexine Baton NP on 07/06/21 after his next appointment with Dr. Julien Nordmann. All questions answered.

## 2021-06-28 ENCOUNTER — Telehealth (HOSPITAL_BASED_OUTPATIENT_CLINIC_OR_DEPARTMENT_OTHER): Payer: Self-pay

## 2021-06-28 ENCOUNTER — Inpatient Hospital Stay: Payer: Commercial Managed Care - HMO

## 2021-06-28 ENCOUNTER — Other Ambulatory Visit: Payer: Commercial Managed Care - HMO

## 2021-06-28 NOTE — Telephone Encounter (Signed)
Home health papers were faxed over to [facility]. Fax number [phone]. Faxed paperwork is in the file cabinet as well.

## 2021-06-29 ENCOUNTER — Telehealth: Payer: Self-pay | Admitting: Internal Medicine

## 2021-06-29 NOTE — Telephone Encounter (Signed)
Called patient regarding all upcoming appointments, patient is notified.

## 2021-07-05 MED FILL — Dexamethasone Sodium Phosphate Inj 100 MG/10ML: INTRAMUSCULAR | Qty: 1 | Status: AC

## 2021-07-06 ENCOUNTER — Inpatient Hospital Stay: Payer: Commercial Managed Care - HMO

## 2021-07-06 ENCOUNTER — Other Ambulatory Visit: Payer: Self-pay | Admitting: Medical Oncology

## 2021-07-06 ENCOUNTER — Inpatient Hospital Stay (HOSPITAL_BASED_OUTPATIENT_CLINIC_OR_DEPARTMENT_OTHER): Payer: Commercial Managed Care - HMO | Admitting: Nurse Practitioner

## 2021-07-06 ENCOUNTER — Other Ambulatory Visit: Payer: Self-pay

## 2021-07-06 ENCOUNTER — Inpatient Hospital Stay: Payer: Commercial Managed Care - HMO | Admitting: Nutrition

## 2021-07-06 ENCOUNTER — Inpatient Hospital Stay (HOSPITAL_BASED_OUTPATIENT_CLINIC_OR_DEPARTMENT_OTHER): Payer: Commercial Managed Care - HMO | Admitting: Internal Medicine

## 2021-07-06 ENCOUNTER — Encounter: Payer: Self-pay | Admitting: Nurse Practitioner

## 2021-07-06 VITALS — BP 146/100 | HR 108 | Temp 98.0°F | Resp 16 | Ht 69.0 in | Wt 163.1 lb

## 2021-07-06 DIAGNOSIS — C3411 Malignant neoplasm of upper lobe, right bronchus or lung: Secondary | ICD-10-CM | POA: Diagnosis not present

## 2021-07-06 DIAGNOSIS — C3490 Malignant neoplasm of unspecified part of unspecified bronchus or lung: Secondary | ICD-10-CM | POA: Diagnosis not present

## 2021-07-06 DIAGNOSIS — C3491 Malignant neoplasm of unspecified part of right bronchus or lung: Secondary | ICD-10-CM

## 2021-07-06 DIAGNOSIS — Z7189 Other specified counseling: Secondary | ICD-10-CM

## 2021-07-06 DIAGNOSIS — C7951 Secondary malignant neoplasm of bone: Secondary | ICD-10-CM | POA: Diagnosis not present

## 2021-07-06 DIAGNOSIS — Z515 Encounter for palliative care: Secondary | ICD-10-CM

## 2021-07-06 DIAGNOSIS — R530 Neoplastic (malignant) related fatigue: Secondary | ICD-10-CM | POA: Diagnosis not present

## 2021-07-06 DIAGNOSIS — Z5111 Encounter for antineoplastic chemotherapy: Secondary | ICD-10-CM

## 2021-07-06 DIAGNOSIS — Z87891 Personal history of nicotine dependence: Secondary | ICD-10-CM

## 2021-07-06 DIAGNOSIS — I878 Other specified disorders of veins: Secondary | ICD-10-CM

## 2021-07-06 LAB — CBC WITH DIFFERENTIAL (CANCER CENTER ONLY)
Abs Immature Granulocytes: 0.01 10*3/uL (ref 0.00–0.07)
Basophils Absolute: 0 10*3/uL (ref 0.0–0.1)
Basophils Relative: 1 %
Eosinophils Absolute: 0.2 10*3/uL (ref 0.0–0.5)
Eosinophils Relative: 7 %
HCT: 31.7 % — ABNORMAL LOW (ref 39.0–52.0)
Hemoglobin: 10.1 g/dL — ABNORMAL LOW (ref 13.0–17.0)
Immature Granulocytes: 0 %
Lymphocytes Relative: 21 %
Lymphs Abs: 0.7 10*3/uL (ref 0.7–4.0)
MCH: 29.4 pg (ref 26.0–34.0)
MCHC: 31.9 g/dL (ref 30.0–36.0)
MCV: 92.2 fL (ref 80.0–100.0)
Monocytes Absolute: 0.5 10*3/uL (ref 0.1–1.0)
Monocytes Relative: 14 %
Neutro Abs: 1.9 10*3/uL (ref 1.7–7.7)
Neutrophils Relative %: 57 %
Platelet Count: 158 10*3/uL (ref 150–400)
RBC: 3.44 MIL/uL — ABNORMAL LOW (ref 4.22–5.81)
RDW: 18.6 % — ABNORMAL HIGH (ref 11.5–15.5)
WBC Count: 3.4 10*3/uL — ABNORMAL LOW (ref 4.0–10.5)
nRBC: 0 % (ref 0.0–0.2)

## 2021-07-06 LAB — CMP (CANCER CENTER ONLY)
ALT: 9 U/L (ref 0–44)
AST: 16 U/L (ref 15–41)
Albumin: 3.5 g/dL (ref 3.5–5.0)
Alkaline Phosphatase: 72 U/L (ref 38–126)
Anion gap: 6 (ref 5–15)
BUN: 10 mg/dL (ref 8–23)
CO2: 28 mmol/L (ref 22–32)
Calcium: 9.9 mg/dL (ref 8.9–10.3)
Chloride: 105 mmol/L (ref 98–111)
Creatinine: 0.85 mg/dL (ref 0.61–1.24)
GFR, Estimated: >60 mL/min (ref >60)
Glucose, Bld: 127 mg/dL — ABNORMAL HIGH (ref 70–99)
Potassium: 3.7 mmol/L (ref 3.5–5.1)
Sodium: 139 mmol/L (ref 135–145)
Total Bilirubin: 0.4 mg/dL (ref 0.3–1.2)
Total Protein: 7 g/dL (ref 6.5–8.1)

## 2021-07-06 NOTE — Progress Notes (Signed)
Adam [name]:([phone]) [phone]   Fax:([phone]) [phone]  OFFICE PROGRESS NOTE  de Adam, [name], MD [address] [place] [name] [address]  DIAGNOSIS: Metastatic small cell lung cancer initially diagnosed as Limited stage (T1c, N2, M0) small cell lung cancer diagnosed in February 2022.  PRIOR THERAPY:  1) Systemic chemotherapy according to phase 3 randomized clinical trial of chemoradiation versus chemoradiation plus atezolizumab status post 3 cycles.  This treatment was discontinued secondary to intolerance. 2) palliative radiotherapy to the left hip area under the care of Dr. Tammi Klippel completed January 03, 2021. 3) Second line systemic chemotherapy with carboplatin for AUC of 5 on day 1, etoposide 100 Mg/M2 on days 1, 2 and 3 as well as Imfinzi 1500 Mg IV on day 1 and Cosela 240 Mg/M2 before chemotherapy on days 1, 2 and 3 every 3 weeks.  First dose December 13, 2020.  Status post 5 cycles.  Starting from cycle #5 the patient will be on maintenance treatment with Imfinzi every 4 weeks.  This was discontinued secondary to disease progression  CURRENT THERAPY: Third line systemic chemotherapy with Zepzelca (lurbinectedin) 3.2 Mg/M2 IV every 3 weeks.  First dose 04/19/2021.  Status post 2 cycles.  The dose of Zepzelca (lurbinectedin) was reduced to 2.6 Mg/M2 starting from cycle #3.  INTERVAL HISTORY: Adam [name] 66 y.o. male returns to the clinic today for follow-up visit accompanied by his ex-wife.  The patient is feeling fine today with no concerning complaints except for mild fatigue and shortness of breath with exertion.  He also has mild cough.  The drainage from the left hip had significantly improved and the wound VAC was removed.  He denied having any current chest pain or hemoptysis.  He has no nausea, vomiting, diarrhea or constipation.  He has no headache or visual changes.  His treatment was delayed for cycle #3 for several weeks because of significant  chemotherapy-induced pancytopenia as well as left hip infection and subcutaneous abscess.  The patient is here today for evaluation before starting cycle #3.  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  MEDICATIONS:  Current Outpatient Medications  Medication Sig Dispense Refill   acetaminophen (TYLENOL) 650 MG CR tablet Take 650 mg by mouth every 8 (eight) hours as needed for pain.     bisacodyl (DULCOLAX) 5 MG EC tablet Take 5 mg by mouth daily as needed for moderate constipation.     collagenase (SANTYL) 250 UNIT/GM ointment Apply topically daily. 30 g 1   lidocaine-prilocaine (EMLA) cream Apply 1 application topically as needed. (Patient taking differently: Apply 1 application  topically as needed (to access port).) 30 g 2   mirtazapine (REMERON) 15 MG tablet Take 2 tablets (30 mg total) by mouth at bedtime. 30 tablet 2   No current facility-administered medications for this visit.    SURGICAL HISTORY:  Past Surgical History:  Procedure Laterality Date   BRONCHIAL BRUSHINGS  02/08/2020   Procedure: BRONCHIAL BRUSHINGS;  Surgeon: Freddi Starr, MD;  Location[facility] ENDOSCOPY;  Service: Pulmonary;;   BRONCHIAL NEEDLE ASPIRATION BIOPSY  02/08/2020   Procedure: BRONCHIAL NEEDLE ASPIRATION BIOPSIES;  Surgeon: Freddi Starr, MD;  Location: [place];  Service: Pulmonary;;   INCISION AND DRAINAGE ABSCESS Left 05/31/2021   Procedure: Left Hip Excision;  Surgeon: Michael Boston, MD;  Location: [facility];  Service: General;  Laterality: Left;   IR IMAGING GUIDED PORT INSERTION  12/30/2020   IR REMOVAL TUN ACCESS W/ PORT W/O FL MOD SED  05/26/2021   KNEE SURGERY Right    SHOULDER SURGERY Left    VIDEO BRONCHOSCOPY WITH ENDOBRONCHIAL ULTRASOUND N/A 02/08/2020   Procedure: VIDEO BRONCHOSCOPY WITH ENDOBRONCHIAL ULTRASOUND;  Surgeon: Freddi Starr, MD;  Location: [facility] ENDOSCOPY;   Service: Pulmonary;  Laterality: N/A;    REVIEW OF SYSTEMS:  Constitutional: positive for fatigue Eyes: negative Ears, nose, mouth, throat, and face: negative Respiratory: positive for cough and dyspnea on exertion Cardiovascular: negative Gastrointestinal: negative Genitourinary:negative Integument/breast: negative Hematologic/lymphatic: negative Musculoskeletal:positive for arthralgias Neurological: negative Behavioral/Psych: negative Endocrine: negative Allergic/Immunologic: negative   PHYSICAL EXAMINATION: General appearance: alert, cooperative, fatigued, and no distress Head: Normocephalic, without obvious abnormality, atraumatic Neck: no adenopathy, no JVD, supple, symmetrical, trachea midline, and thyroid not enlarged, symmetric, no tenderness/mass/nodules Lymph nodes: Cervical, supraclavicular, and axillary nodes normal. Resp: clear to auscultation bilaterally Back: symmetric, no curvature. ROM normal. No CVA tenderness. Cardio: regular rate and rhythm, S1, S2 normal, no murmur, click, rub or gallop GI: soft, non-tender; bowel sounds normal; no masses,  no organomegaly Extremities: extremities normal, atraumatic, no cyanosis or edema Neurologic: Alert and oriented X 3, normal strength and tone. Normal symmetric reflexes. Normal coordination and gait  ECOG PERFORMANCE STATUS: 1 - Symptomatic but completely ambulatory  Blood pressure (!) 146/100, pulse (!) 108, temperature 98 F (36.7 C), temperature source Oral, resp. rate 16, height 5\' 9"  (1.753 m), weight 163 lb 1.6 oz (74 kg).  LABORATORY DATA: Lab Results  Component Value Date   WBC 3.4 (L) 07/06/2021   HGB 10.1 (L) 07/06/2021   HCT 31.7 (L) 07/06/2021   MCV 92.2 07/06/2021   PLT 158 07/06/2021      Chemistry      Component Value Date/Time   NA 139 07/06/2021 0848   K 3.7 07/06/2021 0848   CL 105 07/06/2021 0848   CO2 28 07/06/2021 0848   BUN 10 07/06/2021 0848   CREATININE 0.85 07/06/2021 0848       Component Value Date/Time   CALCIUM 9.9 07/06/2021 0848   ALKPHOS 72 07/06/2021 0848   AST 16 07/06/2021 0848   ALT 9 07/06/2021 0848   BILITOT 0.4 07/06/2021 0848       RADIOGRAPHIC STUDIES: No results found.  ASSESSMENT AND PLAN: This is a very pleasant 66 years old African-American male recently diagnosed with limited stage small cell lung cancer in February 2022.  The patient is currently undergoing treatment according to the [place] clinical trial where the patient would be randomized to treatment with concurrent chemoradiation with cisplatin and/or etoposide versus concurrent chemoradiation with the same regimen in addition to atezolizumab. He is status post 3 cycles of the treatment.  His treatment was discontinued secondary to intolerance and the patient has been in observation since April 2022. He had MRI of the brain performed recently that showed no evidence of metastatic disease to the brain. Repeat CT scan of the chest, abdomen and pelvis unfortunately showed evidence for disease progression involving new mass centered in the superior aspect of the right major fissure, widespread nodularity in the adjacent portion of the upper right lung, multiple new pleural-based lesions in the right hemithorax, new middle mediastinal lymphadenopathy, new hypervascular lesion in the proximal body of the pancreas and new lytic lesions in the parasymphyseal region of the left to pubic bone. His insurance declined a PET scan.  The patient is started palliative systemic chemotherapy with carboplatin for AUC of 5 on day 1, etoposide 100 Mg/M2 on days 1, 2 and 3 with Cosela 4 Myeloprotection before chemotherapy and Imfinzi 1500 Mg IV every 3 weeks with the induction phase followed by maintenance Imfinzi every 4 weeks if the patient has no evidence for disease progression.  He is status post 5 cycles.  Starting from cycle #5 he will be on treatment with Imfinzi every 4 weeks.  His treatment was  discontinued after cycle #5 secondary to disease progression. The patient is currently undergoing a third line treatment with Zepzelca (lurbinectedin) 3.2 Mg/M2 every 3 weeks status post 2 cycles.  He was admitted to the hospital after cycle #2 with significant chemotherapy-induced pancytopenia and neutropenic fever as well as left hip subcutaneous abscess.  The patient had drainage of the abscess in the hospital with wound VAC but this was discontinued recently after improvement of his condition. I recommended for him to proceed with cycle #3 today as planned but with reduced dose Zepzelca (lurbinectedin) 2.6 mg/M2. I will see the patient back for follow-up visit in 3 weeks for evaluation with repeat CT scan of the chest, abdomen and pelvis for restaging of his disease. For the chemotherapy-induced anemia, we will continue to monitor his hemoglobin and hematocrit closely and consider The patient for transfusion as needed. The patient was advised to call immediately if he has any other concerning symptoms in the interval. The patient voices understanding of current disease status and treatment options and is in agreement with the current care plan.  All questions were answered. The patient knows to call the clinic with any problems, questions or concerns. We can certainly see the patient much sooner if necessary.  The total time spent in the appointment was 35 minutes.  Disclaimer: This note was dictated with voice recognition software. Similar sounding words can inadvertently be transcribed and may not be corrected upon review.

## 2021-07-06 NOTE — Progress Notes (Signed)
Nutrition follow-up completed with patient during infusion for metastatic lung cancer.    Weight was documented as 163 pounds 1.6 ounces on June 29.   This is increased slightly from from 162 pounds 2 ounces May 3.  Noted labs: Glucose 127.  Patient reports his appetite is good. States he thinks he is eating well. He is consuming between 1 and 2 cartons of Ensure per day. He is disappointed because he thought he had gained more weight. Patient currently denying nutrition impact symptoms.  Nutrition diagnosis: Inadequate intake continues.  Intervention: Enforced importance of smaller more frequent meals and snacks with high-calorie, high-protein foods. Increase Ensure Plus or equivalent to 2-3 bottles daily. Provided 1 complementary case of Ensure Plus high-protein. Provided support and encouragement.  Monitoring, evaluation, goals: Patient will tolerate increased calories and protein to minimize further weight loss.  Next visit: Thursday, July 20 during infusion.  **Disclaimer: This note was dictated with voice recognition software. Similar sounding words can inadvertently be transcribed and this note may contain transcription errors which may not have been corrected upon publication of note.**

## 2021-07-06 NOTE — Progress Notes (Signed)
[place]  [phone] [phone]   Name: LORIE MELICHAR Date: 07/06/2021 MRN: [id number]  DOB: 1955/02/10  Patient Care Team: [place], Blondell Reveal, MD as PCP - General (Family Medicine) [name], RN (Inactive) as Registered Nurse Curt Bears, MD as Consulting Physician (Oncology)    REASON FOR CONSULTATION: PERSHING SKIDMORE is a 66 y.o. 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  Palliative ask to see for symptom management and goals of care.    SOCIAL HISTORY:     reports that he quit smoking about 3 years ago. His smoking use included cigarettes. He has a 20.00 pack-year smoking history. He has never used smokeless tobacco. He reports that he does not currently use alcohol. He reports that he does not use drugs.  ADVANCE DIRECTIVES:  Patient does not have a documented advanced directive.  Is interested in completing.  Education provided, advanced directive packet and clinic dates made available.  CODE STATUS: DNR  PAST MEDICAL HISTORY: Past Medical History:  Diagnosis Date   Hypertension    Lung cancer ([place])    small cell RUL    PAST SURGICAL HISTORY:  Past Surgical History:  Procedure Laterality Date   BRONCHIAL BRUSHINGS  02/08/2020   Procedure: BRONCHIAL BRUSHINGS;  Surgeon: Freddi Starr, MD;  Location: [facility];  Service: Pulmonary;;   BRONCHIAL NEEDLE ASPIRATION BIOPSY  02/08/2020   Procedure: BRONCHIAL NEEDLE ASPIRATION BIOPSIES;  Surgeon: Freddi Starr, MD;  Location: [facility];  Service: Pulmonary;;   INCISION AND DRAINAGE ABSCESS Left 05/31/2021   Procedure: Left Hip Excision;  Surgeon: Michael Boston, MD;  Location: [facility];  Service: General;  Laterality: Left;   IR IMAGING GUIDED PORT INSERTION  12/30/2020   IR REMOVAL TUN ACCESS W/ PORT W/O FL  MOD SED  05/26/2021   KNEE SURGERY Right    SHOULDER SURGERY Left    VIDEO BRONCHOSCOPY WITH ENDOBRONCHIAL ULTRASOUND N/A 02/08/2020   Procedure: VIDEO BRONCHOSCOPY WITH ENDOBRONCHIAL ULTRASOUND;  Surgeon: Freddi Starr, MD;  Location: [facility];  Service: Pulmonary;  Laterality: N/A;    HEMATOLOGY/ONCOLOGY HISTORY:  Oncology History Overview Note  He is currently on the following trial. Please refer to my original note for complete history.  LIMITED STAGE SMALL CELL LUNG CANCER (LS-SCLC): A PHASE III  RANDOMIZED STUDY OF CHEMORADIATION VERSUS CHEMORADIATION PLUS ATEZOLIZUMAB (10-June-2019)  C1 D1 02/16/2020-02/18/2020 He has consented for the trial and is enrolled to the arm without immunotherapy. C2 D1-D3 03/15/2020-03/17/2020 C3D1 completed 04/05/2020 through 04/07/2020 C4D1 delayed by a week because of neutropenia. C4D1 completed 4/26-22-05/05/2020   Primary cancer of right upper lobe of lung ([place])  02/06/2020 Initial Diagnosis   Primary cancer of right upper lobe of lung ([place])   02/10/2020 Cancer Staging   Staging form: Lung, AJCC 8th Edition - Pathologic stage from 02/10/2020: No Stage Recommended (cT1c, cN2, cM0) - Signed by Benay Pike, MD on 02/10/2020 Histopathologic type: Small cell carcinoma, NOS Stage prefix: Initial diagnosis Histologic grade (G): GX Histologic grading system: 4 grade system   Small cell lung cancer, 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[place])  02/10/2020 Initial Diagnosis   Small cell lung cancer in adult [facility])   02/16/2020 - 02/18/2020 Chemotherapy         03/15/2020 - 05/05/2020 Chemotherapy   Patient is on Treatment Plan : [place]  [id number] CISPLATIN D1 / ETOPOSIDE D1-3 ARM 1     12/14/2020 - 03/15/2021 Chemotherapy   Patient is on Treatment Plan : LUNG SMALL CELL EXTENSIVE STAGE Durvalumab + Carboplatin D1 + Etoposide D1-3 q21d x 4 Cycles / Durvalumab q28d     04/19/2021 -  Chemotherapy   Patient is on Treatment Plan : LUNG SMALL CELL  Lurbinectedin q21d       ALLERGIES:  has No Known Allergies.  MEDICATIONS:  Current Outpatient Medications  Medication Sig Dispense Refill   acetaminophen (TYLENOL) 650 MG CR tablet Take 650 mg by mouth every 8 (eight) hours as needed for pain.     bisacodyl (DULCOLAX) 5 MG EC tablet Take 5 mg by mouth daily as needed for moderate constipation.     collagenase (SANTYL) 250 UNIT/GM ointment Apply topically daily. 30 g 1   lidocaine-prilocaine (EMLA) cream Apply 1 application topically as needed. (Patient taking differently: Apply 1 application  topically as needed (to access port).) 30 g 2   mirtazapine (REMERON) 15 MG tablet Take 2 tablets (30 mg total) by mouth at bedtime. 30 tablet 2   No current facility-administered medications for this visit.    VITAL SIGNS: There were no vitals taken for this visit. There were no vitals filed for this visit.  Estimated body mass index is 24.09 kg/m as calculated from the following:   Height as of an earlier encounter on 07/06/21: 5\' 9"  (1.753 m).   Weight as of an earlier encounter on 07/06/21: 163 lb 1.6 oz (74 kg).  LABS: CBC:    Component Value Date/Time   WBC 3.4 (L) 07/06/2021 0848   WBC 4.9 06/02/2021 0444   HGB 10.1 (L) 07/06/2021 0848   HCT 31.7 (L) 07/06/2021 0848   PLT 158 07/06/2021 0848   MCV 92.2 07/06/2021 0848   NEUTROABS 1.9 07/06/2021 0848   LYMPHSABS 0.7 07/06/2021 0848   MONOABS 0.5 07/06/2021 0848   EOSABS 0.2 07/06/2021 0848   BASOSABS 0.0 07/06/2021 0848   Comprehensive Metabolic Panel:    Component Value Date/Time   NA 139 07/06/2021 0848   K 3.7 07/06/2021 0848   CL 105 07/06/2021 0848   CO2 28 07/06/2021 0848   BUN 10 07/06/2021 0848   CREATININE 0.85 07/06/2021 0848   GLUCOSE 127 (H) 07/06/2021 0848   CALCIUM 9.9 07/06/2021 0848   AST 16 07/06/2021 0848   ALT 9 07/06/2021 0848   ALKPHOS 72 07/06/2021 0848   BILITOT 0.4 07/06/2021 0848   PROT 7.0 07/06/2021 0848   ALBUMIN 3.5 07/06/2021 0848     RADIOGRAPHIC STUDIES: No results found.  PERFORMANCE STATUS (ECOG) : 1 - Symptomatic but completely ambulatory  Review of Systems  Respiratory:  Positive for cough.   Musculoskeletal:  Positive for arthralgias.  Unless otherwise noted, a complete review of systems is negative.  Physical Exam General: NAD Cardiovascular: regular rate and rhythm Pulmonary: clear ant fields Abdomen: soft, nontender, + bowel sounds Extremities: no edema, no joint deformities Skin: no rashes Neurological: AAOx3, mood appropriate  IMPRESSION: This is my initial visit with Mr. Easterly.  He is by himself today.  No acute distress noted.  Ambulatory without assistive devices.  Alert and oriented able to engage appropriately in discussions.  I introduced myself, [place] RN, and Palliative's role in collaboration with the oncology team. Concept of Palliative Care was introduced as specialized medical care for people and their families living with serious illness.  It focuses on providing relief from the symptoms and  stress of a serious illness.  The goal is to improve quality of life for both the patient and the family. Values and goals of care important to patient and family were attempted to be elicited.   Mr. Otting previously was living by himself until recent fall.  He now is living with his ex-wife who is a former wound care nurse.  He has a son who lives in [place].  A sister who was involved in his care.  He is a retired Insurance claims handler.  Enjoys being outside.  Christian faith.  Ambulatory without assistive devices.  He is able to perform all ADLs independently.  Does endorse ongoing fatigue and shortness of breath with extraneous activity.  Appetite has greatly improved per patient.  Current weight is 163 pounds which is up from 157 pounds on 06/15/2021.  Denies nausea, vomiting, diarrhea, or constipation.  Does endorse occasional pain/discomfort however feels this is currently  manageable with Tylenol.  No symptom management needs at this time.  Goals of care discussion We discussed his current illness and what it means in the larger context of his on-going co-morbidities. Natural disease trajectory and expectations were discussed.  Mr. karis emig understanding of his current illness and treatment with palliative intent.  He is realistic in his understanding that in the future as his disease further progress or symptoms become more noticeable he would then need to focus on his comfort.  Whitney is clear in his expressed wishes to continue to treat the treatable allow him every opportunity to continue to thrive.  Is taking things 1 day at a time and enjoying his good days.  I empathetically approach discussions regarding CODE STATUS and advanced directive.  He does not have a completed advanced directive.  In our discussions he reports that his sister, Baker Janus would be his primary medical decision-maker and his ex-wife [name] would be secondary.  He does not wish to be a burden on his family at any point and also would not want to suffer.  Education provided on advanced directives.  He verbalized understanding expressing wishes to consider completing.  Packet provided along with clinic dates.  He plans to call and schedule an appointment or contact our office for further assistance with arranging.  Mr. Ouch would not want his life prolonged with life-sustaining measures if there is no chance for meaningful recovery or he is in a suffering state.  In the event this occurs his desire would be for natural death. I discussed the importance of continued conversation with family and their medical providers regarding overall plan of care and treatment options, ensuring decisions are within the context of the patients values and GOCs.  PLAN: Establish therapeutic relationship.  Education provided on palliative's role in collaboration with his oncology medical team  No symptom  management needs at this time  Ongoing goals of care discussions and support  Patient does not have completed advanced directives.  Education provided along with packet and clinic dates.  He plans to schedule an appointment to complete.   Dao is clear in his expressed wishes to continue to treat the treatable allow him every opportunity to continue to thrive.  If his health was to decline, no meaningful recovery, or any suffering state he would then wish to focus on his comfort with a desire for natural death.   I will plan to see patient back in 2-4 weeks in collaboration to other oncology appointments.    Patient expressed understanding and was  in agreement with this plan. He also understands that He can call the clinic at any time with any questions, concerns, or complaints.   Thank you for your referral and allowing Palliative to assist in Mr. Philopater Mucha Bamberg's care.   Number and complexity of problems addressed: 1 HIGH - 1 or more chronic illnesses with SEVERE exacerbation, progression, or side effects of treatment - advanced cancer, pain. Any controlled substances utilized were prescribed in the context of palliative care.  Time Total: 45 min  Visit consisted of counseling and education dealing with the complex and emotionally intense issues of symptom management and palliative care in the setting of serious and potentially life-threatening illness.Greater than 50%  of this time was spent counseling and coordinating care related to the above assessment and plan.  Signed by: Alda Lea, AGPCNP-BC Palliative Medicine Team/[place]

## 2021-07-06 NOTE — Progress Notes (Signed)
Treatment held today, per Dr. Julien Nordmann, until patient gets new port. Abelina Bachelor, RN is setting up placement and patient will be contacted in regards to scheduling port placement and further chemo treatments.

## 2021-07-07 ENCOUNTER — Other Ambulatory Visit: Payer: Self-pay | Admitting: Radiology

## 2021-07-12 ENCOUNTER — Other Ambulatory Visit: Payer: Managed Care, Other (non HMO)

## 2021-07-12 ENCOUNTER — Ambulatory Visit: Payer: Managed Care, Other (non HMO)

## 2021-07-12 ENCOUNTER — Ambulatory Visit: Payer: Managed Care, Other (non HMO) | Admitting: Physician Assistant

## 2021-07-14 ENCOUNTER — Encounter: Payer: Self-pay | Admitting: Internal Medicine

## 2021-07-14 ENCOUNTER — Ambulatory Visit (INDEPENDENT_AMBULATORY_CARE_PROVIDER_SITE_OTHER): Payer: Commercial Managed Care - HMO | Admitting: Internal Medicine

## 2021-07-14 ENCOUNTER — Other Ambulatory Visit: Payer: Self-pay

## 2021-07-14 VITALS — BP 132/88 | HR 104 | Temp 97.9°F | Wt 161.0 lb

## 2021-07-14 DIAGNOSIS — A4151 Sepsis due to Escherichia coli [E. coli]: Secondary | ICD-10-CM | POA: Diagnosis not present

## 2021-07-14 DIAGNOSIS — R6520 Severe sepsis without septic shock: Secondary | ICD-10-CM | POA: Diagnosis not present

## 2021-07-14 DIAGNOSIS — N179 Acute kidney failure, unspecified: Secondary | ICD-10-CM | POA: Diagnosis not present

## 2021-07-14 NOTE — Assessment & Plan Note (Signed)
Patient appears to be doing well today from an infection standpoint.  He completed 4 weeks of antibiotics from the date of his last procedure while admitted at [facility] in May 2023 on 06/28/21 and his wounds are healing very nicely.  Will continue to follow off antibiotics and see him as needed at this time.

## 2021-07-14 NOTE — Progress Notes (Signed)
[facility]  CHIEF COMPLAINT:    Follow up for E coli infection  SUBJECTIVE:    Adam [name] is a 66 y.o. male with PMHx as below who presents to the clinic for E coli infection.   Patient is here today for routine follow up.  He was last seen on 06/14/21.  He had a prolonged admission at [facility] from 5/10-5/26 when admitted for sepsis due to E coli bacteremia due to left forearm and leg lower extremity cellulitis/abscess.    Patient underwent bedside I&D of left arm in the ED on 5/10 and with orthopedics on 5/18.  Cultures grew pan-sensitive E coli.  Status post additional excisional debridement of left hip tissue mass on 5/24 with general surgery.  Cultures also grew E coli and pathology showed fat necrosis with abscess.  Patient also had his Port A Cath removed 5/19.  He was seen by ID while admitted and recommended to complete cefadroxil 1gm BID x 3 weeks from date of last procedure (5/24-6/14).  His neutropenia resolved while in the hospital and he was seen by myself on 06/14/21.  At that time he was doing well.  His cefadroxil 1gm BID was extended an extra week until he completed therapy on 06/28/21 (4 weeks total from date of last procedure).  Since our last visit on 06/14/21, he saw oncology on 06/15/21, primary care on 06/22/21, surgery on 07/04/21.  At the surgery appointment, his wound vac was discontinued and he was started on wet to dry dressings. He was re-evaluated by oncology on 07/06/21 for evaluation before starting cycle #3 of chemotherapy where they recommended proceed with reduced dose Zepzelca pending Port placement next week..  Please see A&P for the details of today's visit and status of the patient's medical problems.   Patient's Medications  New Prescriptions   No medications on file  Previous Medications   ACETAMINOPHEN (TYLENOL) 650 MG CR TABLET    Take 650 mg by mouth every 8 (eight) hours as needed for pain.   BISACODYL (DULCOLAX) 5  MG EC TABLET    Take 5 mg by mouth daily as needed for moderate constipation.   COLLAGENASE (SANTYL) 250 UNIT/GM OINTMENT    Apply topically daily.   LIDOCAINE-PRILOCAINE (EMLA) CREAM    Apply 1 application topically as needed.   MIRTAZAPINE (REMERON) 15 MG TABLET    Take 2 tablets (30 mg total) by mouth at bedtime.  Modified Medications   No medications on file  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[place])    small cell RUL    Social History   Tobacco Use   Smoking status: Former    Packs/day: 1.00    Years: 20.00    Total pack years: 20.00    Types: Cigarettes    Quit date: 06/09/2018    Years since quitting: 3.0   Smokeless tobacco: Never  Vaping Use   Vaping Use: Never used  Substance Use Topics   Alcohol use: Not Currently   Drug use: Never    Family History  Problem Relation Age of Onset   Heart disease Mother    Cancer Cousin    Breast cancer Neg Hx    Colon cancer Neg Hx    Pancreatic cancer Neg Hx    Prostate cancer Neg Hx  No Known Allergies  Review of Systems  Constitutional: Negative.   Respiratory: Negative.    Cardiovascular: Negative.   Skin:        Healing wounds     OBJECTIVE:    Vitals:   07/14/21 1543  BP: 132/88  Pulse: (!) 104  Temp: 97.9 F (36.6 C)  TempSrc: Oral  SpO2: 99%  Weight: 161 lb (73 kg)   Body mass index is 23.78 kg/m.  Physical Exam Constitutional:      General: He is not in acute distress.    Appearance: Normal appearance.  Eyes:     Extraocular Movements: Extraocular movements intact.     Conjunctiva/sclera: Conjunctivae normal.  Pulmonary:     Effort: Pulmonary effort is normal. No respiratory distress.  Abdominal:     General: There is no distension.     Palpations: Abdomen is soft.  Musculoskeletal:     Right lower leg: No edema.     Left lower leg: No edema.  Skin:    General: Skin is warm and dry.      Comments: His left leg and left arm wounds have shown great improvement and healing.  Neurological:     General: No focal deficit present.     Mental Status: He is alert and oriented to person, place, and time.  Psychiatric:        Mood and Affect: Mood normal.        Behavior: Behavior normal.      Labs and Microbiology:    Latest Ref Rng & Units 07/06/2021    8:48 AM 06/15/2021   10:36 AM 06/02/2021    4:44 AM  CBC  WBC 4.0 - 10.5 K/uL 3.4  3.8  4.9   Hemoglobin 13.0 - 17.0 g/dL 10.1  10.5  8.8   Hematocrit 39.0 - 52.0 % 31.7  33.8  28.1   Platelets 150 - 400 K/uL 158  232  125       Latest Ref Rng & Units 07/06/2021    8:48 AM 06/15/2021   10:36 AM 05/31/2021    9:19 PM  CMP  Glucose 70 - 99 mg/dL 127  110  159   BUN 8 - 23 mg/dL 10  10  12    Creatinine 0.61 - 1.24 mg/dL 0.85  0.84  0.81   Sodium 135 - 145 mmol/L 139  140  135   Potassium 3.5 - 5.1 mmol/L 3.7  3.8  4.4   Chloride 98 - 111 mmol/L 105  106  105   CO2 22 - 32 mmol/L 28  28  25    Calcium 8.9 - 10.3 mg/dL 9.9  9.9  9.1   Total Protein 6.5 - 8.1 g/dL 7.0  7.5    Total Bilirubin 0.3 - 1.2 mg/dL 0.4  0.4    Alkaline Phos 38 - 126 U/L 72  80    AST 15 - 41 U/L 16  17    ALT 0 - 44 U/L 9  8        ASSESSMENT & PLAN:    Sepsis due to E. coli bacteremia, left forearm and left lower extremity cellulitis/abscess Patient appears to be doing well today from an infection standpoint.  He completed 4 weeks of antibiotics from the date of his last procedure while admitted at [facility] in May 2023 on 06/28/21 and his wounds are healing very nicely.  Will continue to follow off antibiotics and see him as needed at this time.  Raynelle High[facility] for Infectious Disease [facility] 07/14/2021, 3:56 PM

## 2021-07-18 ENCOUNTER — Other Ambulatory Visit: Payer: Self-pay | Admitting: Radiology

## 2021-07-19 ENCOUNTER — Ambulatory Visit (HOSPITAL_COMMUNITY)
Admission: RE | Admit: 2021-07-19 | Discharge: 2021-07-19 | Disposition: A | Discharge status: Home / Self Care | Payer: Commercial Managed Care - HMO | Source: Ambulatory Visit | Attending: Internal Medicine | Admitting: Internal Medicine

## 2021-07-19 ENCOUNTER — Other Ambulatory Visit: Payer: Self-pay | Admitting: Internal Medicine

## 2021-07-19 ENCOUNTER — Other Ambulatory Visit: Payer: Self-pay

## 2021-07-19 ENCOUNTER — Encounter (HOSPITAL_COMMUNITY): Payer: Self-pay

## 2021-07-19 DIAGNOSIS — I10 Essential (primary) hypertension: Secondary | ICD-10-CM | POA: Insufficient documentation

## 2021-07-19 DIAGNOSIS — Z87891 Personal history of nicotine dependence: Secondary | ICD-10-CM | POA: Diagnosis not present

## 2021-07-19 DIAGNOSIS — C3411 Malignant neoplasm of upper lobe, right bronchus or lung: Secondary | ICD-10-CM | POA: Diagnosis present

## 2021-07-19 DIAGNOSIS — I878 Other specified disorders of veins: Secondary | ICD-10-CM

## 2021-07-19 HISTORY — PX: IR IMAGING GUIDED PORT INSERTION: IMG5740

## 2021-07-19 MED ORDER — LIDOCAINE-EPINEPHRINE 1 %-1:100000 IJ SOLN
INTRAMUSCULAR | Status: AC | PRN
  Administered 2021-07-19: 10 mL via INTRADERMAL

## 2021-07-19 MED ORDER — MIDAZOLAM HCL 2 MG/2ML IJ SOLN
INTRAMUSCULAR | Status: AC | PRN
  Administered 2021-07-19: .5 mg via INTRAVENOUS

## 2021-07-19 MED ORDER — FENTANYL CITRATE (PF) 100 MCG/2ML IJ SOLN
INTRAMUSCULAR | Status: AC | PRN
  Administered 2021-07-19: 50 ug via INTRAVENOUS

## 2021-07-19 MED ORDER — HEPARIN SOD (PORK) LOCK FLUSH 100 UNIT/ML IV SOLN
INTRAVENOUS | Status: AC | PRN
  Administered 2021-07-19: 500 [IU] via INTRAVENOUS

## 2021-07-19 MED ORDER — FENTANYL CITRATE (PF) 100 MCG/2ML IJ SOLN
INTRAMUSCULAR | Status: AC
  Filled 2021-07-19: qty 2

## 2021-07-19 MED ORDER — MIDAZOLAM HCL 2 MG/2ML IJ SOLN
INTRAMUSCULAR | Status: AC | PRN
  Administered 2021-07-19: 1 mg via INTRAVENOUS

## 2021-07-19 MED ORDER — FENTANYL CITRATE (PF) 100 MCG/2ML IJ SOLN
INTRAMUSCULAR | Status: AC | PRN
  Administered 2021-07-19: 25 ug via INTRAVENOUS

## 2021-07-19 MED ORDER — MIDAZOLAM HCL 2 MG/2ML IJ SOLN
INTRAMUSCULAR | Status: AC
  Filled 2021-07-19: qty 2

## 2021-07-19 MED ORDER — HEPARIN SOD (PORK) LOCK FLUSH 100 UNIT/ML IV SOLN
INTRAVENOUS | Status: AC
  Filled 2021-07-19: qty 5

## 2021-07-19 MED ORDER — LIDOCAINE-EPINEPHRINE 1 %-1:100000 IJ SOLN
INTRAMUSCULAR | Status: AC
  Filled 2021-07-19: qty 1

## 2021-07-19 MED ORDER — SODIUM CHLORIDE 0.9 % IV SOLN: INTRAVENOUS | Status: DC

## 2021-07-19 NOTE — Procedures (Signed)
Interventional Radiology Procedure Note  Procedure: RT IJ POWER PORT    Complications: None  Estimated Blood Loss:  MIN  Findings: TIP SVCRA    M. TREVOR Tesslyn Baumert, MD    

## 2021-07-19 NOTE — Discharge Instructions (Signed)
Please call Interventional Radiology clinic [phone] with any questions or concerns.  You may remove your dressing and shower tomorrow.  DO NOT use EMLA cream on your port site for 2 weeks as this cream will remove surgical glue on your incision.  Implanted Port Insertion, Care After This sheet gives you information about how to care for yourself after your procedure. Your health care provider may also give you more specific instructions. If you have problems or questions, contact your health care provider. What can I expect after the procedure? After the procedure, it is common to have: Discomfort at the port insertion site. Bruising on the skin over the port. This should improve over 3-4 days. Follow these instructions at home: [facility] care After your port is placed, you will get a manufacturer's information card. The card has information about your port. Keep this card with you at all times. Take care of the port as told by your health care provider. Ask your health care provider if you or a family member can get training for taking care of the port at home. A home health care nurse may also take care of the port. Make sure to remember what type of port you have. Incision care Follow instructions from your health care provider about how to take care of your port insertion site. Make sure you: Wash your hands with soap and water before and after you change your bandage (dressing). If soap and water are not available, use hand sanitizer. Change your dressing as told by your health care provider. Leave stitches (sutures), skin glue, or adhesive strips in place. These skin closures may need to stay in place for 2 weeks or longer. If adhesive strip edges start to loosen and curl up, you may trim the loose edges. Do not remove adhesive strips completely unless your health care provider tells you to do that. Check your port insertion site every day for signs of infection. Check for: Redness,  swelling, or pain. Fluid or blood. Warmth. Pus or a bad smell.        Activity Return to your normal activities as told by your health care provider. Ask your health care provider what activities are safe for you. Do not lift anything that is heavier than 10 lb (4.5 kg), or the limit that you are told, until your health care provider says that it is safe. General instructions Take over-the-counter and prescription medicines only as told by your health care provider. Do not take baths, swim, or use a hot tub until your health care provider approves. Ask your health care provider if you may take showers. You may only be allowed to take sponge baths. Do not drive for 24 hours if you were given a sedative during your procedure. Wear a medical alert bracelet in case of an emergency. This will tell any health care providers that you have a port. Keep all follow-up visits as told by your health care provider. This is important. Contact a health care provider if: You cannot flush your port with saline as directed, or you cannot draw blood from the port. You have a fever or chills. You have redness, swelling, or pain around your port insertion site. You have fluid or blood coming from your port insertion site. Your port insertion site feels warm to the touch. You have pus or a bad smell coming from the port insertion site. Get help right away if: You have chest pain or shortness of breath. You have bleeding from  your port that you cannot control. Summary Take care of the port as told by your health care provider. Keep the manufacturer's information card with you at all times. Change your dressing as told by your health care provider. Contact a health care provider if you have a fever or chills or if you have redness, swelling, or pain around your port insertion site. Keep all follow-up visits as told by your health care provider. This information is not intended to replace advice given to you  by your health care provider. Make sure you discuss any questions you have with your health care provider. Document Revised: 07/23/2017 Document Reviewed: 07/23/2017 Elsevier Patient Education  2021 [employer].   Moderate Conscious Sedation, Adult, Care After This sheet gives you information about how to care for yourself after your procedure. Your health care provider may also give you more specific instructions. If you have problems or questions, contact your health care provider. What can I expect after the procedure? After the procedure, it is common to have: Sleepiness for several hours. Impaired judgment for several hours. Difficulty with balance. Vomiting if you eat too soon. Follow these instructions at home: For the time period you were told by your health care provider: Rest. Do not participate in activities where you could fall or become injured. Do not drive or use machinery. Do not drink alcohol. Do not take sleeping pills or medicines that cause drowsiness. Do not make important decisions or sign legal documents. Do not take care of children on your own.        Eating and drinking Follow the diet recommended by your health care provider. Drink enough fluid to keep your urine pale yellow. If you vomit: Drink water, juice, or soup when you can drink without vomiting. Make sure you have little or no nausea before eating solid foods.    General instructions Take over-the-counter and prescription medicines only as told by your health care provider. Have a responsible adult stay with you for the time you are told. It is important to have someone help care for you until you are awake and alert. Do not smoke. Keep all follow-up visits as told by your health care provider. This is important. Contact a health care provider if: You are still sleepy or having trouble with balance after 24 hours. You feel light-headed. You keep feeling nauseous or you keep vomiting. You  develop a rash. You have a fever. You have redness or swelling around the IV site. Get help right away if: You have trouble breathing. You have new-onset confusion at home. Summary After the procedure, it is common to feel sleepy, have impaired judgment, or feel nauseous if you eat too soon. Rest after you get home. Know the things you should not do after the procedure. Follow the diet recommended by your health care provider and drink enough fluid to keep your urine pale yellow. Get help right away if you have trouble breathing or new-onset confusion at home. This information is not intended to replace advice given to you by your health care provider. Make sure you discuss any questions you have with your health care provider. Document Revised: 04/24/2019 Document Reviewed: 11/20/2018 Elsevier Patient Education  2021 [employer].

## 2021-07-19 NOTE — Consult Note (Signed)
Chief Complaint: Patient was seen in consultation today for port-a-catheter placement at the request of [facility]  Referring Physician(s): Mohamed,Mohamed  Supervising Physician: Daryll Brod  Patient Status: [facility] - Out-pt  History of Present Illness: Adam [name] is a 66 y.o. male with PMH significant for hypertension and small cell lung cancer being seen today for image-guided port-a-catheter placement. Patient previously had a right-sided port placed on 12/30/20 by Dr Maryelizabeth Kaufmann, and removed on 05/26/21 by Dr Anselm Pancoast. IR was consulted by Dr Julien Nordmann to place a new port for chemotherapy.  Past Medical History:  Diagnosis Date   Hypertension    Lung cancer ([place])    small cell RUL    Past Surgical History:  Procedure Laterality Date   BRONCHIAL BRUSHINGS  02/08/2020   Procedure: BRONCHIAL BRUSHINGS;  Surgeon: Freddi Starr, MD;  Location: [facility] ENDOSCOPY;  Service: Pulmonary;;   BRONCHIAL NEEDLE ASPIRATION BIOPSY  02/08/2020   Procedure: BRONCHIAL NEEDLE ASPIRATION BIOPSIES;  Surgeon: Freddi Starr, MD;  Location: [place];  Service: Pulmonary;;   INCISION AND DRAINAGE ABSCESS Left 05/31/2021   Procedure: Left Hip Excision;  Surgeon: Michael Boston, MD;  Location: [facility] ORS;  Service: General;  Laterality: Left;   IR IMAGING GUIDED PORT INSERTION  12/30/2020   IR REMOVAL TUN ACCESS W/ PORT W/O FL MOD SED  05/26/2021   KNEE SURGERY Right    SHOULDER SURGERY Left    VIDEO BRONCHOSCOPY WITH ENDOBRONCHIAL ULTRASOUND N/A 02/08/2020   Procedure: VIDEO BRONCHOSCOPY WITH ENDOBRONCHIAL ULTRASOUND;  Surgeon: Freddi Starr, MD;  Location: [place];  Service: Pulmonary;  Laterality: N/A;    Allergies: Patient has no known allergies.  Medications: Prior to Admission medications   Medication Sig Start Date End Date Taking? Authorizing Provider  acetaminophen (TYLENOL) 650 MG CR tablet Take 650 mg by mouth every 8 (eight) hours as needed for pain.   Yes [provider]  collagenase (SANTYL) 250 UNIT/GM ointment Apply topically daily. 06/02/21  Yes Mercy Riding, MD  bisacodyl (DULCOLAX) 5 MG EC tablet Take 5 mg by mouth daily as needed for moderate constipation.    [provider]  lidocaine-prilocaine (EMLA) cream Apply 1 application topically as needed. Patient taking differently: Apply 1 application  topically as needed (to access port). 01/11/21   Heilingoetter, Cassandra L, PA-C  mirtazapine (REMERON) 15 MG tablet Take 2 tablets (30 mg total) by mouth at bedtime. 05/10/21   Curt Bears, MD     Family History  Problem Relation Age of Onset   Heart disease Mother    Cancer Cousin    Breast cancer Neg Hx    Colon cancer Neg Hx    Pancreatic cancer Neg Hx    Prostate cancer Neg Hx     Social History   Socioeconomic History   Marital status: Divorced    Spouse name: [name]   Number of children: Not on file   Years of education: Not on file   Highest education level: Not on file  Occupational History   Not on file  Tobacco Use   Smoking status: Former    Packs/day: 1.00    Years: 20.00    Total pack years: 20.00    Types: Cigarettes    Quit date: 06/09/2018    Years since quitting: 3.1   Smokeless tobacco: Never  Vaping Use   Vaping Use: Never used  Substance and Sexual Activity   Alcohol use: Not Currently   Drug use: Never   Sexual activity:  Yes  Other Topics Concern   Not on file  Social History Narrative   Not on file   Social Determinants of Health   Financial Resource Strain: Not on file  Food Insecurity: Not on file  Transportation Needs: Not on file  Physical Activity: Not on file  Stress: Not on file  Social Connections: Not on file      Review of Systems: A 12 point ROS discussed and pertinent positives are indicated in the HPI above.  All other systems are negative.  Review of Systems  Constitutional:  Negative for chills and fever.  Respiratory:  Negative for chest tightness and  shortness of breath.   Cardiovascular:  Negative for chest pain and leg swelling.  Gastrointestinal:  Negative for diarrhea, nausea and vomiting.  Neurological:  Negative for dizziness and headaches.  Psychiatric/Behavioral:  Negative for confusion.     Vital Signs: BP (!) 153/97 (BP Location: Right Arm)   Pulse 99   Temp 98.1 F (36.7 C) (Oral)   Resp 16   Ht 5\' 9"  (1.753 m)   Wt 161 lb (73 kg)   SpO2 100%   BMI 23.78 kg/m     Physical Exam Vitals reviewed.  Constitutional:      General: He is not in acute distress.    Appearance: He is normal weight.  HENT:     Mouth/Throat:     Mouth: Mucous membranes are moist.  Cardiovascular:     Rate and Rhythm: Normal rate and regular rhythm.     Pulses: Normal pulses.     Heart sounds: Normal heart sounds.  Pulmonary:     Effort: Pulmonary effort is normal. No respiratory distress.     Breath sounds: Normal breath sounds.  Abdominal:     General: Bowel sounds are normal.     Palpations: Abdomen is soft.     Tenderness: There is no abdominal tenderness.  Skin:    General: Skin is warm and dry.  Neurological:     Mental Status: He is alert and oriented to person, place, and time.  Psychiatric:        Mood and Affect: Mood normal.        Behavior: Behavior normal.     Imaging: No results found.  Labs:  CBC: Recent Labs    06/01/21 0415 06/02/21 0444 06/15/21 1036 07/06/21 0848  WBC 6.3 4.9 3.8* 3.4*  HGB 9.1* 8.8* 10.5* 10.1*  HCT 28.8* 28.1* 33.8* 31.7*  PLT 100* 125* 232 158    COAGS: Recent Labs    05/17/21 1142 05/18/21 0501  INR 1.2 1.2    BMP: Recent Labs    05/31/21 0400 05/31/21 2119 06/15/21 1036 07/06/21 0848  NA 138 135 140 139  K 3.9 4.4 3.8 3.7  CL 108 105 106 105  CO2 25 25 28 28   GLUCOSE 102* 159* 110* 127*  BUN 10 12 10 10   CALCIUM 8.8* 9.1 9.9 9.9  CREATININE 0.69 0.81 0.84 0.85  GFRNONAA >60 >60 >60 >60    LIVER FUNCTION TESTS: Recent Labs    05/17/21 1142  05/18/21 0501 05/31/21 2119 06/15/21 1036 07/06/21 0848  BILITOT 0.9 1.0  --  0.4 0.4  AST 29 23  --  17 16  ALT 34 26  --  8 9  ALKPHOS 54 44  --  80 72  PROT 7.5 6.3*  --  7.5 7.0  ALBUMIN 2.7* 2.2* 2.4* 3.4* 3.5    TUMOR MARKERS: No results for  input(s): "AFPTM", "CEA", "CA199", "CHROMGRNA" in the last 8760 hours.  Assessment and Plan:  Adam [name] is a 66 yo male with small cell lung cancer of the right upper lobe being seen today for image-guided port-a-catheter placement. Case has been reviewed and patient is approved for port placement on 07/19/21 with Dr Annamaria Boots.  Risks and benefits of image guided port-a-catheter placement was discussed with the patient including, but not limited to bleeding, infection, pneumothorax, or fibrin sheath development and need for additional procedures.  All of the patient's questions were answered, patient is agreeable to proceed. Consent signed and in chart.  Thank you for this interesting consult.  I greatly enjoyed meeting Adam [name] and look forward to participating in their care.  A copy of this report was sent to the requesting provider on this date.  Electronically Signed: Lura Em, PA-C 07/19/2021, 12:00 PM   I spent a total of    15 Minutes in face to face in clinical consultation, greater than 50% of which was counseling/coordinating care for image-guided port-a-catheter placement.

## 2021-07-26 ENCOUNTER — Ambulatory Visit (HOSPITAL_COMMUNITY)
Admission: RE | Admit: 2021-07-26 | Discharge: 2021-07-26 | Disposition: A | Discharge status: Home / Self Care | Payer: Commercial Managed Care - HMO | Source: Ambulatory Visit | Attending: Internal Medicine | Admitting: Internal Medicine

## 2021-07-26 DIAGNOSIS — C3490 Malignant neoplasm of unspecified part of unspecified bronchus or lung: Secondary | ICD-10-CM | POA: Insufficient documentation

## 2021-07-26 MED ORDER — IOHEXOL 300 MG/ML  SOLN
100.0000 mL | Freq: Once | INTRAMUSCULAR | Status: AC | PRN
  Administered 2021-07-26: 100 mL via INTRAVENOUS

## 2021-07-26 MED FILL — Dexamethasone Sodium Phosphate Inj 100 MG/10ML: INTRAMUSCULAR | Qty: 1 | Status: AC

## 2021-07-27 ENCOUNTER — Inpatient Hospital Stay: Payer: Commercial Managed Care - HMO | Attending: Hematology and Oncology | Admitting: Physician Assistant

## 2021-07-27 ENCOUNTER — Inpatient Hospital Stay: Payer: Commercial Managed Care - HMO | Admitting: Nutrition

## 2021-07-27 ENCOUNTER — Inpatient Hospital Stay: Payer: Commercial Managed Care - HMO

## 2021-07-27 ENCOUNTER — Other Ambulatory Visit: Payer: Self-pay

## 2021-07-27 ENCOUNTER — Inpatient Hospital Stay (HOSPITAL_BASED_OUTPATIENT_CLINIC_OR_DEPARTMENT_OTHER): Payer: Commercial Managed Care - HMO

## 2021-07-27 VITALS — BP 154/100 | HR 98 | Temp 97.8°F | Resp 16 | Ht 69.0 in | Wt 168.7 lb

## 2021-07-27 DIAGNOSIS — D61818 Other pancytopenia: Secondary | ICD-10-CM | POA: Insufficient documentation

## 2021-07-27 DIAGNOSIS — C3411 Malignant neoplasm of upper lobe, right bronchus or lung: Secondary | ICD-10-CM | POA: Diagnosis not present

## 2021-07-27 DIAGNOSIS — C7951 Secondary malignant neoplasm of bone: Secondary | ICD-10-CM | POA: Diagnosis present

## 2021-07-27 DIAGNOSIS — I10 Essential (primary) hypertension: Secondary | ICD-10-CM | POA: Diagnosis not present

## 2021-07-27 DIAGNOSIS — Z5111 Encounter for antineoplastic chemotherapy: Secondary | ICD-10-CM | POA: Diagnosis not present

## 2021-07-27 LAB — CBC WITH DIFFERENTIAL (CANCER CENTER ONLY)
Abs Immature Granulocytes: 0 10*3/uL (ref 0.00–0.07)
Basophils Absolute: 0 10*3/uL (ref 0.0–0.1)
Basophils Relative: 0 %
Eosinophils Absolute: 0.1 10*3/uL (ref 0.0–0.5)
Eosinophils Relative: 5 %
HCT: 31 % — ABNORMAL LOW (ref 39.0–52.0)
Hemoglobin: 10.2 g/dL — ABNORMAL LOW (ref 13.0–17.0)
Immature Granulocytes: 0 %
Lymphocytes Relative: 25 %
Lymphs Abs: 0.7 10*3/uL (ref 0.7–4.0)
MCH: 30.4 pg (ref 26.0–34.0)
MCHC: 32.9 g/dL (ref 30.0–36.0)
MCV: 92.3 fL (ref 80.0–100.0)
Monocytes Absolute: 0.4 10*3/uL (ref 0.1–1.0)
Monocytes Relative: 15 %
Neutro Abs: 1.4 10*3/uL — ABNORMAL LOW (ref 1.7–7.7)
Neutrophils Relative %: 55 %
Platelet Count: 129 10*3/uL — ABNORMAL LOW (ref 150–400)
RBC: 3.36 MIL/uL — ABNORMAL LOW (ref 4.22–5.81)
RDW: 15.6 % — ABNORMAL HIGH (ref 11.5–15.5)
WBC Count: 2.6 10*3/uL — ABNORMAL LOW (ref 4.0–10.5)
nRBC: 0 % (ref 0.0–0.2)

## 2021-07-27 LAB — CMP (CANCER CENTER ONLY)
ALT: 8 U/L (ref 0–44)
AST: 15 U/L (ref 15–41)
Albumin: 3.5 g/dL (ref 3.5–5.0)
Alkaline Phosphatase: 64 U/L (ref 38–126)
Anion gap: 3 — ABNORMAL LOW (ref 5–15)
BUN: 11 mg/dL (ref 8–23)
CO2: 30 mmol/L (ref 22–32)
Calcium: 9.8 mg/dL (ref 8.9–10.3)
Chloride: 103 mmol/L (ref 98–111)
Creatinine: 0.78 mg/dL (ref 0.61–1.24)
GFR, Estimated: >60 mL/min (ref >60)
Glucose, Bld: 97 mg/dL (ref 70–99)
Potassium: 3.9 mmol/L (ref 3.5–5.1)
Sodium: 136 mmol/L (ref 135–145)
Total Bilirubin: 0.5 mg/dL (ref 0.3–1.2)
Total Protein: 7 g/dL (ref 6.5–8.1)

## 2021-07-27 MED ORDER — PALONOSETRON HCL INJECTION 0.25 MG/5ML
0.2500 mg | Freq: Once | INTRAVENOUS | Status: AC
  Administered 2021-07-27: 0.25 mg via INTRAVENOUS
  Filled 2021-07-27: qty 5

## 2021-07-27 MED ORDER — HEPARIN SOD (PORK) LOCK FLUSH 100 UNIT/ML IV SOLN
500.0000 [IU] | Freq: Once | INTRAVENOUS | Status: AC | PRN
  Administered 2021-07-27: 500 [IU]

## 2021-07-27 MED ORDER — SODIUM CHLORIDE 0.9% FLUSH
10.0000 mL | INTRAVENOUS | Status: DC | PRN
  Administered 2021-07-27: 10 mL

## 2021-07-27 MED ORDER — SODIUM CHLORIDE 0.9 % IV SOLN
2.6000 mg/m2 | Freq: Once | INTRAVENOUS | Status: AC
  Administered 2021-07-27: 5 mg via INTRAVENOUS
  Filled 2021-07-27: qty 10

## 2021-07-27 MED ORDER — SODIUM CHLORIDE 0.9 % IV SOLN: Freq: Once | INTRAVENOUS | Status: AC

## 2021-07-27 MED ORDER — SODIUM CHLORIDE 0.9 % IV SOLN
10.0000 mg | Freq: Once | INTRAVENOUS | Status: AC
  Administered 2021-07-27: 10 mg via INTRAVENOUS
  Filled 2021-07-27: qty 1
  Filled 2021-07-27: qty 10

## 2021-07-27 NOTE — Progress Notes (Signed)
[facility] OFFICE PROGRESS NOTE  de [place], [name], MD [address] [employer] [address]  DIAGNOSIS: Metastatic small cell lung cancer initially diagnosed as Limited stage (T1c, N2, M0) small cell lung cancer diagnosed in February 2022.  PRIOR THERAPY: 1) Systemic chemotherapy according to phase 3 randomized clinical trial of chemoradiation versus chemoradiation plus atezolizumab status post 3 cycles.  This treatment was discontinued secondary to intolerance. 2) palliative radiotherapy to the left hip area under the care of Dr. Tammi Klippel completed January 03, 2021. 3) Second line systemic chemotherapy with carboplatin for AUC of 5 on day 1, etoposide 100 Mg/M2 on days 1, 2 and 3 as well as Imfinzi 1500 Mg IV on day 1 and Cosela 240 Mg/M2 before chemotherapy on days 1, 2 and 3 every 3 weeks.  First dose December 13, 2020.  Status post 5 cycles.  Starting from cycle #5 the patient will be on maintenance treatment with Imfinzi every 4 weeks.  This was discontinued secondary to disease progression  CURRENT THERAPY:  Third line systemic chemotherapy with Zepzelca (lurbinectedin) 3.2 Mg/M2 IV every 3 weeks.  First dose 04/19/2021.  Status post 2 cycles.  The dose of Zepzelca (lurbinectedin) was reduced to 2.6 Mg/M2 starting from cycle #3  INTERVAL HISTORY: Adam [name] 66 y.o. male returns to clinic today for follow-up visit.  The patient was last seen by Dr. Julien Nordmann on 07/06/2021.  To summarize, the patient had evidence of disease progression in April 2023.  Therefore his treatment was switched to systemic chemotherapy with Zepzelca.  The patient was admitted to the hospital after cycle #2 due to neutropenia, wounds, and bacteremia.  The patient has been following closely with general surgery and infectious disease.  The patient reports he had his wound VAC removed last week.  Starting from today, the patient's dose of Zepzelca is going to be reduced to 2.6 mg per metered squared.   Patient had a Port-A-Cath placed since last being seen.  Since last being seen, the patient denies any changes in his health.  He gained 7 pounds.  He is scheduled to see a member the nutritionist team while in the infusion room today.  He denies any fever, chills, or night sweats.  He denies any significant shortness of breath, hemoptysis, or chest pain.  He reports he has a mild baseline cough but nothing out of the ordinary.  Denies any nausea, vomiting, diarrhea, or constipation.  Denies any headache or visual changes.  The patient recently had a restaging CT scan performed.  He is here today for evaluation to review his scan results before starting cycle #3.    MEDICAL HISTORY: Past Medical History:  Diagnosis Date   Hypertension    Lung cancer ([place])    small cell RUL    ALLERGIES:  has No Known Allergies.  MEDICATIONS:  Current Outpatient Medications  Medication Sig Dispense Refill   acetaminophen (TYLENOL) 650 MG CR tablet Take 650 mg by mouth every 8 (eight) hours as needed for pain.     bisacodyl (DULCOLAX) 5 MG EC tablet Take 5 mg by mouth daily as needed for moderate constipation.     collagenase (SANTYL) 250 UNIT/GM ointment Apply topically daily. 30 g 1   lidocaine-prilocaine (EMLA) cream Apply 1 application topically as needed. (Patient taking differently: Apply 1 application  topically as needed (to access port).) 30 g 2   mirtazapine (REMERON) 15 MG tablet Take 2 tablets (30 mg total) by mouth at bedtime. (Patient  not taking: Reported on 07/27/2021) 30 tablet 2   No current facility-administered medications for this visit.    SURGICAL HISTORY:  Past Surgical History:  Procedure Laterality Date   BRONCHIAL BRUSHINGS  02/08/2020   Procedure: BRONCHIAL BRUSHINGS;  Surgeon: Freddi Starr, MD;  Location: [facility];  Service: Pulmonary;;   BRONCHIAL NEEDLE ASPIRATION BIOPSY  02/08/2020   Procedure: BRONCHIAL NEEDLE ASPIRATION BIOPSIES;  Surgeon: Freddi Starr,  MD;  Location: [facility];  Service: Pulmonary;;   INCISION AND DRAINAGE ABSCESS Left 05/31/2021   Procedure: Left Hip Excision;  Surgeon: Michael Boston, MD;  Location: [facility];  Service: General;  Laterality: Left;   IR IMAGING GUIDED PORT INSERTION  12/30/2020   IR IMAGING GUIDED PORT INSERTION  07/19/2021   IR REMOVAL TUN ACCESS W/ PORT W/O FL MOD SED  05/26/2021   KNEE SURGERY Right    SHOULDER SURGERY Left    VIDEO BRONCHOSCOPY WITH ENDOBRONCHIAL ULTRASOUND N/A 02/08/2020   Procedure: VIDEO BRONCHOSCOPY WITH ENDOBRONCHIAL ULTRASOUND;  Surgeon: Freddi Starr, MD;  Location: [facility];  Service: Pulmonary;  Laterality: N/A;    REVIEW OF SYSTEMS:   Review of Systems  Constitutional: Positive for stable fatigue.  Negative for appetite change, chills, fever and unexpected weight change.  HENT:   Negative for mouth sores, nosebleeds, sore throat and trouble swallowing.   Eyes: Negative for eye problems and icterus.  Respiratory: Negative for cough, hemoptysis, shortness of breath and wheezing.   Cardiovascular: Negative for chest pain and leg swelling.  Gastrointestinal: Negative for abdominal pain, constipation, diarrhea, nausea and vomiting.  Genitourinary: Negative for bladder incontinence, difficulty urinating, dysuria, frequency and hematuria.   Musculoskeletal: Negative for back pain, gait problem, neck pain and neck stiffness.  Skin: Negative for itching and rash.  Neurological: Negative for dizziness, extremity weakness, gait problem, headaches, light-headedness and seizures.  Hematological: Negative for adenopathy. Does not bruise/bleed easily.  Psychiatric/Behavioral: Negative for confusion, depression and sleep disturbance. The patient is not nervous/anxious.     PHYSICAL EXAMINATION:  Blood pressure (!) 154/100, pulse 98, temperature 97.8 F (36.6 C), temperature source Oral, resp. rate 16, height 5\' 9"  (1.753 m), weight 168 lb 11.2 oz (76.5 kg), SpO2 99 %.  ECOG  PERFORMANCE STATUS:1-2  Physical Exam  Constitutional: Oriented to person, place, and time and thin appearing male.  HENT:  Head: Normocephalic and atraumatic.  Mouth/Throat: Oropharynx is clear and moist. No oropharyngeal exudate.  Eyes: Conjunctivae are normal. Right eye exhibits no discharge. Left eye exhibits no discharge. No scleral icterus.  Neck: Normal range of motion. Neck supple.  Cardiovascular: Normal rate, regular rhythm, normal heart sounds and intact distal pulses.   Pulmonary/Chest: Effort normal and breath sounds normal. No respiratory distress. No wheezes. No rales.  Abdominal: Soft. Bowel sounds are normal. Exhibits no distension and no mass. There is no tenderness.  Musculoskeletal: Normal range of motion. Exhibits no edema.  Lymphadenopathy:    No cervical adenopathy.  Neurological: Alert and oriented to person, place, and time. Exhibits normal muscle tone. Gait normal. Coordination normal.  Skin: Skin is warm and dry. No rash noted. Not diaphoretic. No erythema. No pallor.  Psychiatric: Mood, memory and judgment normal.  Vitals reviewed.  LABORATORY DATA: Lab Results  Component Value Date   WBC 2.6 (L) 07/27/2021   HGB 10.2 (L) 07/27/2021   HCT 31.0 (L) 07/27/2021   MCV 92.3 07/27/2021   PLT 129 (L) 07/27/2021      Chemistry      Component  Value Date/Time   NA 136 07/27/2021 1050   K 3.9 07/27/2021 1050   CL 103 07/27/2021 1050   CO2 30 07/27/2021 1050   BUN 11 07/27/2021 1050   CREATININE 0.78 07/27/2021 1050      Component Value Date/Time   CALCIUM 9.8 07/27/2021 1050   ALKPHOS 64 07/27/2021 1050   AST 15 07/27/2021 1050   ALT 8 07/27/2021 1050   BILITOT 0.5 07/27/2021 1050       RADIOGRAPHIC STUDIES:  CT Chest W Contrast  Result Date: 07/27/2021 CLINICAL DATA:  Small cell lung cancer restaging. EXAM: CT CHEST, ABDOMEN, AND PELVIS WITH CONTRAST TECHNIQUE: Multidetector CT imaging of the chest, abdomen and pelvis was performed following the  standard protocol during bolus administration of intravenous contrast. RADIATION DOSE REDUCTION: This exam was performed according to the departmental dose-optimization program which includes automated exposure control, adjustment of the mA and/or kV according to patient size and/or use of iterative reconstruction technique. CONTRAST:  170mL OMNIPAQUE IOHEXOL 300 MG/ML  SOLN COMPARISON:  Multiple prior imaging studies. The most recent comparison is 04/07/2021 FINDINGS: CT CHEST FINDINGS Cardiovascular: The heart is normal in size. No pericardial effusion. The aorta is normal in caliber. Stable tortuosity and mild scattered atherosclerotic calcifications. No dissection. The branch vessels are patent. Age advanced calcifications and significant three-vessel coronary artery calcifications. Mediastinum/Nodes: The mediastinal/paraesophageal mass measures approximately 3.4 x 2.7 cm on image 27/2. When remeasured in the same imaging planes on the prior study this measured 4.0 x 3.3 cm. The right hilar soft tissue mass measures 2.9 x 1.9 cm and on the prior study measured 3.6 x 2.3 cm. No new or enlarging mediastinal or hilar lesions. The esophagus is grossly normal. Lungs/Pleura: Increasing soft tissue density in the right upper lobe medially is likely progressive radiation fibrosis. Irregular nodular pleural thickening anteriorly at the right lung apex appears slightly progressive. The nodular lesion on image 15/2 measures 2.5 x 1.1 cm and previously measured 2.3 x 1.1 cm. Lateral pleural lesion in the right upper lobe on image number 21/2 measures 2.6 by 1.0 cm and previously measured 3.6 x 1.5 cm. There is an enlarging right pleural effusion. I do not see any new pleural nodules. Stable underlying emphysematous changes and pulmonary scarring. 8 mm nodule in the left upper lobe anteriorly on image number 68/6 is unchanged. No new pulmonary nodules. Multiple small nodules along the right major fissure are stable and  likely lymph nodes. Musculoskeletal: No chest wall mass, supraclavicular or axillary adenopathy. Mild stable bilateral gynecomastia. The bony thorax is intact. No lytic or sclerotic bone lesions are identified. CT ABDOMEN PELVIS FINDINGS Hepatobiliary: Stable small low-attenuation lesions in the liver consistent with benign cysts. No worrisome hepatic lesions to suggest metastatic disease. No intrahepatic biliary dilatation. The gallbladder is unremarkable. No common bile duct dilatation. Pancreas: No mass, inflammation or ductal dilatation. Spleen: Stable small low-attenuation splenic lesion inferiorly likely a benign cyst. Small accessory spleen. Adrenals/Urinary Tract: No adrenal gland lesions. No renal lesions or hydronephrosis. Stable left renal calculus. The bladder is unremarkable. Stomach/Bowel: The stomach, duodenum, small bowel and colon are unremarkable. No acute inflammatory process, mass lesions or obstructive findings. Stable descending and sigmoid colon diverticulosis and associated muscular wall thickening. No findings for acute diverticulitis. Vascular/Lymphatic: Stable atherosclerotic calcifications involving the aorta and branch vessels but no aneurysm or dissection. The major venous structures are patent. No mesenteric or retroperitoneal mass or adenopathy. Reproductive: The prostate gland and seminal vesicles are unremarkable. Other: No pelvic mass  or adenopathy. No free pelvic fluid collections. No inguinal mass or adenopathy. Small periumbilical abdominal wall hernia containing fat. Musculoskeletal: Stable appearing metastatic bone lesion involving the left pubic bone with pathologic fracture which shows partial healing. Mild sclerotic changes in the left ischium likely healing metastatic focus. Stable sclerotic lesion involving the right femoral head. No new bone lesions are identified. Evidence of a healing open wound involving the region of the left greater trochanter. IMPRESSION: 1. Slight  interval decrease in size of the left mediastinal/paraesophageal mass and right hilar soft tissue mass. 2. Slight interval decrease in size of the right upper lobe pleural lesions. 3. Progressive soft tissue density in the right upper lobe medially most likely progressive changes of radiation fibrosis but recommend attention on future studies. 4. Enlarging right pleural effusion. 5. Stable 8 mm left upper lobe pulmonary nodule. No new pulmonary nodules. 6. No findings for abdominal/pelvic metastatic disease. 7. Stable metastatic bone lesions.  No new or progressive findings. 8. Healing open wound in the left hip area. * Tracking Code: BO * Aortic Atherosclerosis (ICD10-I70.0) and Emphysema (ICD10-J43.9). Electronically Signed   By: Marijo Sanes M.D.   On: 07/27/2021 09:48   CT Abdomen Pelvis W Contrast  Result Date: 07/27/2021 CLINICAL DATA:  Small cell lung cancer restaging. EXAM: CT CHEST, ABDOMEN, AND PELVIS WITH CONTRAST TECHNIQUE: Multidetector CT imaging of the chest, abdomen and pelvis was performed following the standard protocol during bolus administration of intravenous contrast. RADIATION DOSE REDUCTION: This exam was performed according to the departmental dose-optimization program which includes automated exposure control, adjustment of the mA and/or kV according to patient size and/or use of iterative reconstruction technique. CONTRAST:  163mL OMNIPAQUE IOHEXOL 300 MG/ML  SOLN COMPARISON:  Multiple prior imaging studies. The most recent comparison is 04/07/2021 FINDINGS: CT CHEST FINDINGS Cardiovascular: The heart is normal in size. No pericardial effusion. The aorta is normal in caliber. Stable tortuosity and mild scattered atherosclerotic calcifications. No dissection. The branch vessels are patent. Age advanced calcifications and significant three-vessel coronary artery calcifications. Mediastinum/Nodes: The mediastinal/paraesophageal mass measures approximately 3.4 x 2.7 cm on image 27/2. When  remeasured in the same imaging planes on the prior study this measured 4.0 x 3.3 cm. The right hilar soft tissue mass measures 2.9 x 1.9 cm and on the prior study measured 3.6 x 2.3 cm. No new or enlarging mediastinal or hilar lesions. The esophagus is grossly normal. Lungs/Pleura: Increasing soft tissue density in the right upper lobe medially is likely progressive radiation fibrosis. Irregular nodular pleural thickening anteriorly at the right lung apex appears slightly progressive. The nodular lesion on image 15/2 measures 2.5 x 1.1 cm and previously measured 2.3 x 1.1 cm. Lateral pleural lesion in the right upper lobe on image number 21/2 measures 2.6 by 1.0 cm and previously measured 3.6 x 1.5 cm. There is an enlarging right pleural effusion. I do not see any new pleural nodules. Stable underlying emphysematous changes and pulmonary scarring. 8 mm nodule in the left upper lobe anteriorly on image number 68/6 is unchanged. No new pulmonary nodules. Multiple small nodules along the right major fissure are stable and likely lymph nodes. Musculoskeletal: No chest wall mass, supraclavicular or axillary adenopathy. Mild stable bilateral gynecomastia. The bony thorax is intact. No lytic or sclerotic bone lesions are identified. CT ABDOMEN PELVIS FINDINGS Hepatobiliary: Stable small low-attenuation lesions in the liver consistent with benign cysts. No worrisome hepatic lesions to suggest metastatic disease. No intrahepatic biliary dilatation. The gallbladder is unremarkable. No common  bile duct dilatation. Pancreas: No mass, inflammation or ductal dilatation. Spleen: Stable small low-attenuation splenic lesion inferiorly likely a benign cyst. Small accessory spleen. Adrenals/Urinary Tract: No adrenal gland lesions. No renal lesions or hydronephrosis. Stable left renal calculus. The bladder is unremarkable. Stomach/Bowel: The stomach, duodenum, small bowel and colon are unremarkable. No acute inflammatory process, mass  lesions or obstructive findings. Stable descending and sigmoid colon diverticulosis and associated muscular wall thickening. No findings for acute diverticulitis. Vascular/Lymphatic: Stable atherosclerotic calcifications involving the aorta and branch vessels but no aneurysm or dissection. The major venous structures are patent. No mesenteric or retroperitoneal mass or adenopathy. Reproductive: The prostate gland and seminal vesicles are unremarkable. Other: No pelvic mass or adenopathy. No free pelvic fluid collections. No inguinal mass or adenopathy. Small periumbilical abdominal wall hernia containing fat. Musculoskeletal: Stable appearing metastatic bone lesion involving the left pubic bone with pathologic fracture which shows partial healing. Mild sclerotic changes in the left ischium likely healing metastatic focus. Stable sclerotic lesion involving the right femoral head. No new bone lesions are identified. Evidence of a healing open wound involving the region of the left greater trochanter. IMPRESSION: 1. Slight interval decrease in size of the left mediastinal/paraesophageal mass and right hilar soft tissue mass. 2. Slight interval decrease in size of the right upper lobe pleural lesions. 3. Progressive soft tissue density in the right upper lobe medially most likely progressive changes of radiation fibrosis but recommend attention on future studies. 4. Enlarging right pleural effusion. 5. Stable 8 mm left upper lobe pulmonary nodule. No new pulmonary nodules. 6. No findings for abdominal/pelvic metastatic disease. 7. Stable metastatic bone lesions.  No new or progressive findings. 8. Healing open wound in the left hip area. * Tracking Code: BO * Aortic Atherosclerosis (ICD10-I70.0) and Emphysema (ICD10-J43.9). Electronically Signed   By: Marijo Sanes M.D.   On: 07/27/2021 09:48   IR IMAGING GUIDED PORT INSERTION  Result Date: 07/19/2021 CLINICAL DATA:  LUNG CANCER, ACCESS FOR CHEMOTHERAPY EXAM: RIGHT  INTERNAL JUGULAR SINGLE LUMEN POWER PORT CATHETER INSERTION Date:  07/19/2021 07/19/2021 2:30 pm Radiologist:  M. Daryll Brod, MD Guidance:  Ultrasound fluoroscopic MEDICATIONS: 1% lidocaine with epinephrine for local ANESTHESIA/SEDATION: Versed 2.0 mg IV; Fentanyl 100 mcg IV; Moderate Sedation Time:  24 minutes The patient was continuously monitored during the procedure by the interventional radiology nurse under my direct supervision. FLUOROSCOPY: (4.0 mGy) COMPLICATIONS: None immediate. CONTRAST:  None. PROCEDURE: Informed consent was obtained from the patient following explanation of the procedure, risks, benefits and alternatives. The patient understands, agrees and consents for the procedure. All questions were addressed. A time out was performed. Maximal barrier sterile technique utilized including caps, mask, sterile gowns, sterile gloves, large sterile drape, hand hygiene, and 2% chlorhexidine scrub. Under sterile conditions and local anesthesia, right internal jugular micropuncture venous access was performed. Access was performed with ultrasound. Images were obtained for documentation of the patent right internal jugular vein. A guide wire was inserted followed by a transitional dilator. This allowed insertion of a guide wire and catheter into the IVC. Measurements were obtained from the SVC / RA junction back to the right IJ venotomy site. In the right infraclavicular chest, a subcutaneous pocket was created over the second anterior rib. This was done under sterile conditions and local anesthesia. 1% lidocaine with epinephrine was utilized for this. A 2.5 cm incision was made in the skin. Blunt dissection was performed to create a subcutaneous pocket over the right pectoralis major muscle. The pocket was flushed  with saline vigorously. There was adequate hemostasis. The port catheter was assembled and checked for leakage. The port catheter was secured in the pocket with two retention sutures. The tubing  was tunneled subcutaneously to the right venotomy site and inserted into the SVC/RA junction through a valved peel-away sheath. Position was confirmed with fluoroscopy. Images were obtained for documentation. The patient tolerated the procedure well. No immediate complications. Incisions were closed in a two layer fashion with 4 - 0 Vicryl suture. Dermabond was applied to the skin. The port catheter was accessed, blood was aspirated followed by saline and heparin flushes. Needle was removed. A dry sterile dressing was applied. IMPRESSION: Ultrasound and fluoroscopically guided right internal jugular single lumen power port catheter insertion. Tip in the SVC/RA junction. Catheter ready for use. Electronically Signed   By: Jerilynn Mages.  Shick M.D.   On: 07/19/2021 14:43     ASSESSMENT/PLAN:  This is a very pleasant 66 year old African-American male who was initially diagnosed with limited stage small cell lung cancer in February 2022. The patient had evidence of disease recurrence and extensive stage small cell lung cancer and resumed treatment in December 2022   The patient initially underwent treatment with NRG [id number] clinical trial where the patient would be randomized to treatment with concurrent chemoradiation with cisplatin and/or etoposide versus concurrent chemoradiation with the same regimen in addition to atezolizumab. He is status post 3 cycles of the treatment.  His treatment was discontinued secondary to intolerance and the patient had been in observation from April 2022-December 2022.   Repeat CT scan of the chest, abdomen, pelvis showed evidence of disease progression with a new mass centered in the superior aspect of the right major fissure, widespread nodularity in the adjacent portion of the upper right lung, multiple new pleural-based lesions in the right hemithorax, new middle mediastinal/lymphadenopathy, new hypervascular lesion in the proximal body of the pancreas and a new lytic lesions in the  parasymphyseal region of the left to pubic bone.  His insurance declined a staging PET scan.   Therefore, Dr. Julien Nordmann started the patient on palliative systemic chemotherapy with carboplatin for an AUC of 5 on day 1, etoposide 100 mg per metered squared on days 1, 2, and 3 with Cosela for myelosuppression in addition to immunotherapy with Imfinzi 1500 mg.  He status post 5 cycles.  He tolerated it fairly well except he had decreased p.o. intake and weight loss.  Starting from cycle #5, the patient started maintenance immunotherapy with Imfinzi 1500 mg IV every 4 weeks.  The patient also completed palliative radiation to the left hip under the care of Dr. Tammi Klippel.  This was completed on 01/03/2021   The patient had a restaging CT scan performed in April 2023.  The scan showed evidence of disease progression with interval enlargement of a bulky low left hilar or periaortic lymph node or soft tissue mass, as well as smaller lymph nodes within the superior mediastinum. There was also interval enlargement of multiple right-sided masses and slightly increased right pleural effusion  The patient then started on treatment with chemotherapy with subsequent 3.2 milligrams per kilogram IV every 3 weeks.  The patient status post 2 cycles.  Starting from cycle #2, the patient was hospitalized from 05/17/2021 to 06/02/2021.  He was hospitalized for abscesses, bacteremia, falls, and pancytopenia.  The patient is being followed closely by wound care, infectious disease, and general surgery.  His wound VAC was removed last week.  The patient's treatment continue to be on hold  until improvement in his infection and until he can have another Port-A-Cath placed.  Patient was seen with Dr. Julien Nordmann today.  The patient recently had a restaging CT scan which showed stable to slightly decrease in size of the left mediastinal/paraesophageal mass and right hilar soft tissue mass.  There is also a slight interval decrease in  the right upper lobe pleural lesions.  Starting from today, Dr. Julien Nordmann recommends that we resume his treatment.  However, Dr. Julien Nordmann recommends a dose reduction Zepzelca and the patient will proceed with 2.6 mg per metered squared.  The patient's labs were reviewed which showed mild neutropenia with an ANC of 1.4.  Dr. Julien Nordmann recommends he continue with treatment today at the current dose.  We will need to monitor the patient's labs closely on a weekly basis.  If needed, we will bring the patient in for G-CSF injections if he develops neutropenia.  We will see him back for follow-up visit in 3 weeks for evaluation and repeat blood work before undergoing cycle #4.  The patient was advised to call immediately if she has any concerning symptoms in the interval. The patient voices understanding of current disease status and treatment options and is in agreement with the current care plan. All questions were answered. The patient knows to call the clinic with any problems, questions or concerns. We can certainly see the patient much sooner if necessary    No orders of the defined types were placed in this encounter.     Roldan Laforest L Timberlynn Kizziah, PA-C 07/27/21  ADDENDUM: Hematology/Oncology Attending: I had a face-to-face encounter with the patient today.  I reviewed his record, lab, scan and recommended his care plan.  This is a very pleasant 66 years old African-American male with metastatic small cell lung cancer that was initially diagnosed as limited stage disease in February 2022.  He was treated on a clinical trial with 3 cycles of systemic chemotherapy with cisplatin, etoposide and atezolizumab for 3 cycles discontinued secondary to intolerance followed by disease progression with metastasis to the left hip area status post palliative radiotherapy.  The patient was treated again with second line systemic chemotherapy with carboplatin, etoposide and Imfinzi status post 5 cycles  discontinued secondary to disease progression. He is currently undergoing treatment with third line systemic chemotherapy with Zepzelca (lurbinectedin) currently on a reduced dose of 2.6 Mg/M2 every 3 weeks status post 2 cycles. The patient has initial rough time with this treatment with significant pancytopenia and frequent admission to the hospital with pneumonia and infection. He tolerated the last cycle of his treatment well. He had repeat CT scan of the chest, abdomen and pelvis performed recently. I personally and independently reviewed the scan and discussed the result with the patient today. His scan showed no concerning findings for disease progression and there was some mild improvement. I recommended for him to continue his current treatment with zepzelca and he will proceed with cycle #3 today. He will come back for follow-up visit in 3 weeks for evaluation before the next cycle of his treatment. The patient was advised to call immediately if he has any other concerning symptoms in the interval. The total time spent in the appointment was 30 minutes. Disclaimer: This note was dictated with voice recognition software. Similar sounding words can inadvertently be transcribed and may be missed upon review. Eilleen Kempf, MD

## 2021-07-27 NOTE — Progress Notes (Signed)
Confirmed w/ PA Cassie ok to treat w/ ANC = 1.4 .  Larene Beach, PharmD

## 2021-07-27 NOTE — Progress Notes (Signed)
Nutrition follow up completed with patient during infusion. He reports he feels well. Denies N, V, C, D. He is eating regular meals now and drinking Ensure Plus High Protein, 2 cartons daily. Weight improved to 168 pounds 11.2 oz from 163 pounds June 29. He is agreeable to another case of ensure plus high protein.  Nutrition Diagnosis: Inadequate oral intake improved.  Intervention: Continue meals and snacks daily. Drink 2 Ensure Plus High Protein daily. Provided one complimentary case of Ensure Plus High Protein.  Monitoring, Evaluation, Goals: Patient will continue increased calories and protein to minimize wt loss.  Next Visit: August 10, during infusion.

## 2021-07-27 NOTE — Patient Instructions (Signed)
[facility]  Discharge Instructions: Thank you for choosing [facility] to provide your oncology and hematology care.   If you have a lab appointment with the [facility], please go directly to the [facility] and check in at the registration area.   Wear comfortable clothing and clothing appropriate for easy access to any Portacath or PICC line.   We strive to give you quality time with your provider. You may need to reschedule your appointment if you arrive late (15 or more minutes).  Arriving late affects you and other patients whose appointments are after yours.  Also, if you miss three or more appointments without notifying the office, you may be dismissed from the clinic at the provider's discretion.      For prescription refill requests, have your pharmacy contact our office and allow 72 hours for refills to be completed.    Today you received the following chemotherapy and/or immunotherapy agents: lurbinectedin      To help prevent nausea and vomiting after your treatment, we encourage you to take your nausea medication as directed.  BELOW ARE SYMPTOMS THAT SHOULD BE REPORTED IMMEDIATELY: *FEVER GREATER THAN 100.4 F (38 C) OR HIGHER *CHILLS OR SWEATING *NAUSEA AND VOMITING THAT IS NOT CONTROLLED WITH YOUR NAUSEA MEDICATION *UNUSUAL SHORTNESS OF BREATH *UNUSUAL BRUISING OR BLEEDING *URINARY PROBLEMS (pain or burning when urinating, or frequent urination) *BOWEL PROBLEMS (unusual diarrhea, constipation, pain near the anus) TENDERNESS IN MOUTH AND THROAT WITH OR WITHOUT PRESENCE OF ULCERS (sore throat, sores in mouth, or a toothache) UNUSUAL RASH, SWELLING OR PAIN  UNUSUAL VAGINAL DISCHARGE OR ITCHING   Items with * indicate a potential emergency and should be followed up as soon as possible or go to the Emergency Department if any problems should occur.  Please show the CHEMOTHERAPY ALERT CARD or IMMUNOTHERAPY ALERT CARD at check-in  to the Emergency Department and triage nurse.  Should you have questions after your visit or need to cancel or reschedule your appointment, please contact [facility]  Dept: [phone]  and follow the prompts.  Office hours are 8:00 a.m. to 4:30 p.m. Monday - Friday. Please note that voicemails left after 4:00 p.m. may not be returned until the following business day.  We are closed weekends and major holidays. You have access to a nurse at all times for urgent questions. Please call the main number to the clinic Dept: [phone] and follow the prompts.   For any non-urgent questions, you may also contact your provider using MyChart. We now offer e-Visits for anyone 64 and older to request care online for non-urgent symptoms. For details visit [email or web address].   Also download the MyChart app! Go to the app store, search "MyChart", open the app, select [facility], and log in with your MyChart username and password.  Masks are optional in the cancer centers. If you would like for your care team to wear a mask while they are taking care of you, please let them know. For doctor visits, patients may have with them one support person who is at least 64 years old. At this time, visitors are not allowed in the infusion area.

## 2021-08-06 ENCOUNTER — Ambulatory Visit: Payer: Commercial Managed Care - HMO | Admitting: Nurse Practitioner

## 2021-08-16 MED FILL — Dexamethasone Sodium Phosphate Inj 100 MG/10ML: INTRAMUSCULAR | Qty: 1 | Status: AC

## 2021-08-17 ENCOUNTER — Inpatient Hospital Stay (HOSPITAL_BASED_OUTPATIENT_CLINIC_OR_DEPARTMENT_OTHER): Payer: Commercial Managed Care - HMO | Admitting: Internal Medicine

## 2021-08-17 ENCOUNTER — Inpatient Hospital Stay (HOSPITAL_BASED_OUTPATIENT_CLINIC_OR_DEPARTMENT_OTHER): Payer: Commercial Managed Care - HMO | Admitting: Nurse Practitioner

## 2021-08-17 ENCOUNTER — Encounter: Payer: Self-pay | Admitting: Nurse Practitioner

## 2021-08-17 ENCOUNTER — Inpatient Hospital Stay: Payer: Commercial Managed Care - HMO | Attending: Hematology and Oncology

## 2021-08-17 ENCOUNTER — Inpatient Hospital Stay: Payer: Commercial Managed Care - HMO | Admitting: Nutrition

## 2021-08-17 ENCOUNTER — Encounter: Payer: Self-pay | Admitting: Internal Medicine

## 2021-08-17 ENCOUNTER — Inpatient Hospital Stay: Payer: Commercial Managed Care - HMO | Admitting: Emergency Medicine

## 2021-08-17 ENCOUNTER — Inpatient Hospital Stay: Payer: Commercial Managed Care - HMO

## 2021-08-17 ENCOUNTER — Other Ambulatory Visit: Payer: Commercial Managed Care - HMO

## 2021-08-17 ENCOUNTER — Other Ambulatory Visit: Payer: Self-pay

## 2021-08-17 VITALS — BP 149/104 | HR 79 | Temp 97.4°F | Resp 19 | Wt 169.5 lb

## 2021-08-17 VITALS — BP 143/88 | HR 90 | Resp 18

## 2021-08-17 DIAGNOSIS — N62 Hypertrophy of breast: Secondary | ICD-10-CM | POA: Insufficient documentation

## 2021-08-17 DIAGNOSIS — C3411 Malignant neoplasm of upper lobe, right bronchus or lung: Secondary | ICD-10-CM | POA: Diagnosis present

## 2021-08-17 DIAGNOSIS — C3491 Malignant neoplasm of unspecified part of right bronchus or lung: Secondary | ICD-10-CM

## 2021-08-17 DIAGNOSIS — Z7189 Other specified counseling: Secondary | ICD-10-CM

## 2021-08-17 DIAGNOSIS — K439 Ventral hernia without obstruction or gangrene: Secondary | ICD-10-CM | POA: Diagnosis not present

## 2021-08-17 DIAGNOSIS — I10 Essential (primary) hypertension: Secondary | ICD-10-CM | POA: Diagnosis not present

## 2021-08-17 DIAGNOSIS — J90 Pleural effusion, not elsewhere classified: Secondary | ICD-10-CM | POA: Insufficient documentation

## 2021-08-17 DIAGNOSIS — R530 Neoplastic (malignant) related fatigue: Secondary | ICD-10-CM

## 2021-08-17 DIAGNOSIS — D6481 Anemia due to antineoplastic chemotherapy: Secondary | ICD-10-CM | POA: Diagnosis not present

## 2021-08-17 DIAGNOSIS — T451X5A Adverse effect of antineoplastic and immunosuppressive drugs, initial encounter: Secondary | ICD-10-CM | POA: Insufficient documentation

## 2021-08-17 DIAGNOSIS — Z515 Encounter for palliative care: Secondary | ICD-10-CM | POA: Diagnosis not present

## 2021-08-17 DIAGNOSIS — Z95828 Presence of other vascular implants and grafts: Secondary | ICD-10-CM

## 2021-08-17 DIAGNOSIS — Z5111 Encounter for antineoplastic chemotherapy: Secondary | ICD-10-CM | POA: Diagnosis not present

## 2021-08-17 DIAGNOSIS — C7951 Secondary malignant neoplasm of bone: Secondary | ICD-10-CM | POA: Insufficient documentation

## 2021-08-17 DIAGNOSIS — N200 Calculus of kidney: Secondary | ICD-10-CM | POA: Diagnosis not present

## 2021-08-17 DIAGNOSIS — K5730 Diverticulosis of large intestine without perforation or abscess without bleeding: Secondary | ICD-10-CM | POA: Diagnosis not present

## 2021-08-17 DIAGNOSIS — D7389 Other diseases of spleen: Secondary | ICD-10-CM | POA: Insufficient documentation

## 2021-08-17 DIAGNOSIS — J439 Emphysema, unspecified: Secondary | ICD-10-CM | POA: Insufficient documentation

## 2021-08-17 LAB — CBC WITH DIFFERENTIAL (CANCER CENTER ONLY)
Abs Immature Granulocytes: 0 10*3/uL (ref 0.00–0.07)
Basophils Absolute: 0 10*3/uL (ref 0.0–0.1)
Basophils Relative: 0 %
Eosinophils Absolute: 0 10*3/uL (ref 0.0–0.5)
Eosinophils Relative: 1 %
HCT: 29.2 % — ABNORMAL LOW (ref 39.0–52.0)
Hemoglobin: 9.8 g/dL — ABNORMAL LOW (ref 13.0–17.0)
Immature Granulocytes: 0 %
Lymphocytes Relative: 23 %
Lymphs Abs: 0.6 10*3/uL — ABNORMAL LOW (ref 0.7–4.0)
MCH: 30.4 pg (ref 26.0–34.0)
MCHC: 33.6 g/dL (ref 30.0–36.0)
MCV: 90.7 fL (ref 80.0–100.0)
Monocytes Absolute: 0.6 10*3/uL (ref 0.1–1.0)
Monocytes Relative: 22 %
Neutro Abs: 1.5 10*3/uL — ABNORMAL LOW (ref 1.7–7.7)
Neutrophils Relative %: 54 %
Platelet Count: 111 10*3/uL — ABNORMAL LOW (ref 150–400)
RBC: 3.22 MIL/uL — ABNORMAL LOW (ref 4.22–5.81)
RDW: 14.4 % (ref 11.5–15.5)
WBC Count: 2.7 10*3/uL — ABNORMAL LOW (ref 4.0–10.5)
nRBC: 0 % (ref 0.0–0.2)

## 2021-08-17 LAB — CMP (CANCER CENTER ONLY)
ALT: 7 U/L (ref 0–44)
AST: 14 U/L — ABNORMAL LOW (ref 15–41)
Albumin: 3.8 g/dL (ref 3.5–5.0)
Alkaline Phosphatase: 69 U/L (ref 38–126)
Anion gap: 5 (ref 5–15)
BUN: 11 mg/dL (ref 8–23)
CO2: 29 mmol/L (ref 22–32)
Calcium: 9.4 mg/dL (ref 8.9–10.3)
Chloride: 103 mmol/L (ref 98–111)
Creatinine: 0.79 mg/dL (ref 0.61–1.24)
GFR, Estimated: >60 mL/min (ref >60)
Glucose, Bld: 97 mg/dL (ref 70–99)
Potassium: 3.8 mmol/L (ref 3.5–5.1)
Sodium: 137 mmol/L (ref 135–145)
Total Bilirubin: 0.4 mg/dL (ref 0.3–1.2)
Total Protein: 7.6 g/dL (ref 6.5–8.1)

## 2021-08-17 MED ORDER — SODIUM CHLORIDE 0.9% FLUSH
10.0000 mL | Freq: Once | INTRAVENOUS | Status: AC
  Administered 2021-08-17: 10 mL

## 2021-08-17 MED ORDER — SODIUM CHLORIDE 0.9% FLUSH
10.0000 mL | INTRAVENOUS | Status: DC | PRN
  Administered 2021-08-17: 10 mL

## 2021-08-17 MED ORDER — SODIUM CHLORIDE 0.9 % IV SOLN
10.0000 mg | Freq: Once | INTRAVENOUS | Status: AC
  Administered 2021-08-17: 10 mg via INTRAVENOUS
  Filled 2021-08-17: qty 10

## 2021-08-17 MED ORDER — SODIUM CHLORIDE 0.9 % IV SOLN: Freq: Once | INTRAVENOUS | Status: AC

## 2021-08-17 MED ORDER — HEPARIN SOD (PORK) LOCK FLUSH 100 UNIT/ML IV SOLN
500.0000 [IU] | Freq: Once | INTRAVENOUS | Status: AC | PRN
  Administered 2021-08-17: 500 [IU]

## 2021-08-17 MED ORDER — PALONOSETRON HCL INJECTION 0.25 MG/5ML
0.2500 mg | Freq: Once | INTRAVENOUS | Status: AC
  Administered 2021-08-17: 0.25 mg via INTRAVENOUS

## 2021-08-17 MED ORDER — SODIUM CHLORIDE 0.9 % IV SOLN
2.6000 mg/m2 | Freq: Once | INTRAVENOUS | Status: AC
  Administered 2021-08-17: 5 mg via INTRAVENOUS
  Filled 2021-08-17: qty 10

## 2021-08-17 MED ORDER — PALONOSETRON HCL INJECTION 0.25 MG/5ML
INTRAVENOUS | Status: AC
  Filled 2021-08-17: qty 5

## 2021-08-17 NOTE — Progress Notes (Signed)
[facility] [phone]   [phone]  OFFICE PROGRESS NOTE  [place], [name], MD [address] [place] [address]  DIAGNOSIS: Metastatic small cell lung cancer initially diagnosed as Limited stage (T1c, N2, M0) small cell lung cancer diagnosed in February 2022.  PRIOR THERAPY:  1) Systemic chemotherapy according to phase 3 randomized clinical trial of chemoradiation versus chemoradiation plus atezolizumab status post 3 cycles.  This treatment was discontinued secondary to intolerance. 2) palliative radiotherapy to the left hip area under the care of Dr. Tammi Klippel completed January 03, 2021. 3) Second line systemic chemotherapy with carboplatin for AUC of 5 on day 1, etoposide 100 Mg/M2 on days 1, 2 and 3 as well as Imfinzi 1500 Mg IV on day 1 and Cosela 240 Mg/M2 before chemotherapy on days 1, 2 and 3 every 3 weeks.  First dose December 13, 2020.  Status post 5 cycles.  Starting from cycle #5 the patient will be on maintenance treatment with Imfinzi every 4 weeks.  This was discontinued secondary to disease progression  CURRENT THERAPY: Third line systemic chemotherapy with Zepzelca (lurbinectedin) 3.2 Mg/M2 IV every 3 weeks.  First dose 04/19/2021.  Status post 3 cycles.  The dose of Zepzelca (lurbinectedin) was reduced to 2.6 Mg/M2 starting from cycle #3.  INTERVAL HISTORY: Adam [name] 66 y.o. male returns to the clinic today for follow-up visit.  The patient is feeling fine today with no concerning complaints.  He denied having any current chest pain, shortness of breath, cough or hemoptysis.  He denied having any fever or chills.  He has no nausea, vomiting, diarrhea or constipation.  He has no significant weight loss or night sweats.  He continues to tolerate his treatment with Zepzelca (lurbinectedin) fairly well.  He is here for evaluation before starting cycle #4.  MEDICAL HISTORY: Past Medical History:  Diagnosis Date   Hypertension     Lung cancer ([place])    small cell RUL    ALLERGIES:  has No Known Allergies.  MEDICATIONS:  Current Outpatient Medications  Medication Sig Dispense Refill   acetaminophen (TYLENOL) 650 MG CR tablet Take 650 mg by mouth every 8 (eight) hours as needed for pain.     bisacodyl (DULCOLAX) 5 MG EC tablet Take 5 mg by mouth daily as needed for moderate constipation.     collagenase (SANTYL) 250 UNIT/GM ointment Apply topically daily. 30 g 1   lidocaine-prilocaine (EMLA) cream Apply 1 application topically as needed. (Patient taking differently: Apply 1 application  topically as needed (to access port).) 30 g 2   mirtazapine (REMERON) 15 MG tablet Take 2 tablets (30 mg total) by mouth at bedtime. (Patient not taking: Reported on 07/27/2021) 30 tablet 2   No current facility-administered medications for this visit.    SURGICAL HISTORY:  Past Surgical History:  Procedure Laterality Date   BRONCHIAL BRUSHINGS  02/08/2020   Procedure: BRONCHIAL BRUSHINGS;  Surgeon: Freddi Starr, MD;  Location: [facility];  Service: Pulmonary;;   BRONCHIAL NEEDLE ASPIRATION BIOPSY  02/08/2020   Procedure: BRONCHIAL NEEDLE ASPIRATION BIOPSIES;  Surgeon: Freddi Starr, MD;  Location: [facility];  Service: Pulmonary;;   INCISION AND DRAINAGE ABSCESS Left 05/31/2021   Procedure: Left Hip Excision;  Surgeon: Michael Boston, MD;  Location: [facility];  Service: General;  Laterality: Left;   IR IMAGING GUIDED PORT INSERTION  12/30/2020   IR IMAGING GUIDED PORT INSERTION  07/19/2021   IR REMOVAL TUN ACCESS  W/ PORT W/O FL MOD SED  05/26/2021   KNEE SURGERY Right    SHOULDER SURGERY Left    VIDEO BRONCHOSCOPY WITH ENDOBRONCHIAL ULTRASOUND N/A 02/08/2020   Procedure: VIDEO BRONCHOSCOPY WITH ENDOBRONCHIAL ULTRASOUND;  Surgeon: Freddi Starr, MD;  Location[facility];  Service: Pulmonary;  Laterality: N/A;    REVIEW OF SYSTEMS:  Constitutional: positive for fatigue Eyes: negative Ears, nose, mouth, throat,  and face: negative Respiratory: negative Cardiovascular: negative Gastrointestinal: negative Genitourinary:negative Integument/breast: negative Hematologic/lymphatic: negative Musculoskeletal:positive for muscle weakness Neurological: negative Behavioral/Psych: negative Endocrine: negative Allergic/Immunologic: negative   PHYSICAL EXAMINATION: General appearance: alert, cooperative, fatigued, and no distress Head: Normocephalic, without obvious abnormality, atraumatic Neck: no adenopathy, no JVD, supple, symmetrical, trachea midline, and thyroid not enlarged, symmetric, no tenderness/mass/nodules Lymph nodes: Cervical, supraclavicular, and axillary nodes normal. Resp: clear to auscultation bilaterally Back: symmetric, no curvature. ROM normal. No CVA tenderness. Cardio: regular rate and rhythm, S1, S2 normal, no murmur, click, rub or gallop GI: soft, non-tender; bowel sounds normal; no masses,  no organomegaly Extremities: extremities normal, atraumatic, no cyanosis or edema Neurologic: Alert and oriented X 3, normal strength and tone. Normal symmetric reflexes. Normal coordination and gait  ECOG PERFORMANCE STATUS: 1 - Symptomatic but completely ambulatory  Blood pressure (!) 149/104, pulse 79, temperature (!) 97.4 F (36.3 C), temperature source Tympanic, resp. rate 19, weight 169 lb 8 oz (76.9 kg), SpO2 98 %.  LABORATORY DATA: Lab Results  Component Value Date   WBC 2.7 (L) 08/17/2021   HGB 9.8 (L) 08/17/2021   HCT 29.2 (L) 08/17/2021   MCV 90.7 08/17/2021   PLT 111 (L) 08/17/2021      Chemistry      Component Value Date/Time   NA 136 07/27/2021 1050   K 3.9 07/27/2021 1050   CL 103 07/27/2021 1050   CO2 30 07/27/2021 1050   BUN 11 07/27/2021 1050   CREATININE 0.78 07/27/2021 1050      Component Value Date/Time   CALCIUM 9.8 07/27/2021 1050   ALKPHOS 64 07/27/2021 1050   AST 15 07/27/2021 1050   ALT 8 07/27/2021 1050   BILITOT 0.5 07/27/2021 1050        RADIOGRAPHIC STUDIES: CT Chest W Contrast  Result Date: 07/27/2021 CLINICAL DATA:  Small cell lung cancer restaging. EXAM: CT CHEST, ABDOMEN, AND PELVIS WITH CONTRAST TECHNIQUE: Multidetector CT imaging of the chest, abdomen and pelvis was performed following the standard protocol during bolus administration of intravenous contrast. RADIATION DOSE REDUCTION: This exam was performed according to the departmental dose-optimization program which includes automated exposure control, adjustment of the mA and/or kV according to patient size and/or use of iterative reconstruction technique. CONTRAST:  148mL OMNIPAQUE IOHEXOL 300 MG/ML  SOLN COMPARISON:  Multiple prior imaging studies. The most recent comparison is 04/07/2021 FINDINGS: CT CHEST FINDINGS Cardiovascular: The heart is normal in size. No pericardial effusion. The aorta is normal in caliber. Stable tortuosity and mild scattered atherosclerotic calcifications. No dissection. The branch vessels are patent. Age advanced calcifications and significant three-vessel coronary artery calcifications. Mediastinum/Nodes: The mediastinal/paraesophageal mass measures approximately 3.4 x 2.7 cm on image 27/2. When remeasured in the same imaging planes on the prior study this measured 4.0 x 3.3 cm. The right hilar soft tissue mass measures 2.9 x 1.9 cm and on the prior study measured 3.6 x 2.3 cm. No new or enlarging mediastinal or hilar lesions. The esophagus is grossly normal. Lungs/Pleura: Increasing soft tissue density in the right upper lobe medially is likely progressive radiation  fibrosis. Irregular nodular pleural thickening anteriorly at the right lung apex appears slightly progressive. The nodular lesion on image 15/2 measures 2.5 x 1.1 cm and previously measured 2.3 x 1.1 cm. Lateral pleural lesion in the right upper lobe on image number 21/2 measures 2.6 by 1.0 cm and previously measured 3.6 x 1.5 cm. There is an enlarging right pleural effusion. I do  not see any new pleural nodules. Stable underlying emphysematous changes and pulmonary scarring. 8 mm nodule in the left upper lobe anteriorly on image number 68/6 is unchanged. No new pulmonary nodules. Multiple small nodules along the right major fissure are stable and likely lymph nodes. Musculoskeletal: No chest wall mass, supraclavicular or axillary adenopathy. Mild stable bilateral gynecomastia. The bony thorax is intact. No lytic or sclerotic bone lesions are identified. CT ABDOMEN PELVIS FINDINGS Hepatobiliary: Stable small low-attenuation lesions in the liver consistent with benign cysts. No worrisome hepatic lesions to suggest metastatic disease. No intrahepatic biliary dilatation. The gallbladder is unremarkable. No common bile duct dilatation. Pancreas: No mass, inflammation or ductal dilatation. Spleen: Stable small low-attenuation splenic lesion inferiorly likely a benign cyst. Small accessory spleen. Adrenals/Urinary Tract: No adrenal gland lesions. No renal lesions or hydronephrosis. Stable left renal calculus. The bladder is unremarkable. Stomach/Bowel: The stomach, duodenum, small bowel and colon are unremarkable. No acute inflammatory process, mass lesions or obstructive findings. Stable descending and sigmoid colon diverticulosis and associated muscular wall thickening. No findings for acute diverticulitis. Vascular/Lymphatic: Stable atherosclerotic calcifications involving the aorta and branch vessels but no aneurysm or dissection. The major venous structures are patent. No mesenteric or retroperitoneal mass or adenopathy. Reproductive: The prostate gland and seminal vesicles are unremarkable. Other: No pelvic mass or adenopathy. No free pelvic fluid collections. No inguinal mass or adenopathy. Small periumbilical abdominal wall hernia containing fat. Musculoskeletal: Stable appearing metastatic bone lesion involving the left pubic bone with pathologic fracture which shows partial healing. Mild  sclerotic changes in the left ischium likely healing metastatic focus. Stable sclerotic lesion involving the right femoral head. No new bone lesions are identified. Evidence of a healing open wound involving the region of the left greater trochanter. IMPRESSION: 1. Slight interval decrease in size of the left mediastinal/paraesophageal mass and right hilar soft tissue mass. 2. Slight interval decrease in size of the right upper lobe pleural lesions. 3. Progressive soft tissue density in the right upper lobe medially most likely progressive changes of radiation fibrosis but recommend attention on future studies. 4. Enlarging right pleural effusion. 5. Stable 8 mm left upper lobe pulmonary nodule. No new pulmonary nodules. 6. No findings for abdominal/pelvic metastatic disease. 7. Stable metastatic bone lesions.  No new or progressive findings. 8. Healing open wound in the left hip area. * Tracking Code: BO * Aortic Atherosclerosis (ICD10-I70.0) and Emphysema (ICD10-J43.9). Electronically Signed   By: Marijo Sanes M.D.   On: 07/27/2021 09:48   CT Abdomen Pelvis W Contrast  Result Date: 07/27/2021 CLINICAL DATA:  Small cell lung cancer restaging. EXAM: CT CHEST, ABDOMEN, AND PELVIS WITH CONTRAST TECHNIQUE: Multidetector CT imaging of the chest, abdomen and pelvis was performed following the standard protocol during bolus administration of intravenous contrast. RADIATION DOSE REDUCTION: This exam was performed according to the departmental dose-optimization program which includes automated exposure control, adjustment of the mA and/or kV according to patient size and/or use of iterative reconstruction technique. CONTRAST:  175mL OMNIPAQUE IOHEXOL 300 MG/ML  SOLN COMPARISON:  Multiple prior imaging studies. The most recent comparison is 04/07/2021 FINDINGS: CT CHEST  FINDINGS Cardiovascular: The heart is normal in size. No pericardial effusion. The aorta is normal in caliber. Stable tortuosity and mild scattered  atherosclerotic calcifications. No dissection. The branch vessels are patent. Age advanced calcifications and significant three-vessel coronary artery calcifications. Mediastinum/Nodes: The mediastinal/paraesophageal mass measures approximately 3.4 x 2.7 cm on image 27/2. When remeasured in the same imaging planes on the prior study this measured 4.0 x 3.3 cm. The right hilar soft tissue mass measures 2.9 x 1.9 cm and on the prior study measured 3.6 x 2.3 cm. No new or enlarging mediastinal or hilar lesions. The esophagus is grossly normal. Lungs/Pleura: Increasing soft tissue density in the right upper lobe medially is likely progressive radiation fibrosis. Irregular nodular pleural thickening anteriorly at the right lung apex appears slightly progressive. The nodular lesion on image 15/2 measures 2.5 x 1.1 cm and previously measured 2.3 x 1.1 cm. Lateral pleural lesion in the right upper lobe on image number 21/2 measures 2.6 by 1.0 cm and previously measured 3.6 x 1.5 cm. There is an enlarging right pleural effusion. I do not see any new pleural nodules. Stable underlying emphysematous changes and pulmonary scarring. 8 mm nodule in the left upper lobe anteriorly on image number 68/6 is unchanged. No new pulmonary nodules. Multiple small nodules along the right major fissure are stable and likely lymph nodes. Musculoskeletal: No chest wall mass, supraclavicular or axillary adenopathy. Mild stable bilateral gynecomastia. The bony thorax is intact. No lytic or sclerotic bone lesions are identified. CT ABDOMEN PELVIS FINDINGS Hepatobiliary: Stable small low-attenuation lesions in the liver consistent with benign cysts. No worrisome hepatic lesions to suggest metastatic disease. No intrahepatic biliary dilatation. The gallbladder is unremarkable. No common bile duct dilatation. Pancreas: No mass, inflammation or ductal dilatation. Spleen: Stable small low-attenuation splenic lesion inferiorly likely a benign cyst.  Small accessory spleen. Adrenals/Urinary Tract: No adrenal gland lesions. No renal lesions or hydronephrosis. Stable left renal calculus. The bladder is unremarkable. Stomach/Bowel: The stomach, duodenum, small bowel and colon are unremarkable. No acute inflammatory process, mass lesions or obstructive findings. Stable descending and sigmoid colon diverticulosis and associated muscular wall thickening. No findings for acute diverticulitis. Vascular/Lymphatic: Stable atherosclerotic calcifications involving the aorta and branch vessels but no aneurysm or dissection. The major venous structures are patent. No mesenteric or retroperitoneal mass or adenopathy. Reproductive: The prostate gland and seminal vesicles are unremarkable. Other: No pelvic mass or adenopathy. No free pelvic fluid collections. No inguinal mass or adenopathy. Small periumbilical abdominal wall hernia containing fat. Musculoskeletal: Stable appearing metastatic bone lesion involving the left pubic bone with pathologic fracture which shows partial healing. Mild sclerotic changes in the left ischium likely healing metastatic focus. Stable sclerotic lesion involving the right femoral head. No new bone lesions are identified. Evidence of a healing open wound involving the region of the left greater trochanter. IMPRESSION: 1. Slight interval decrease in size of the left mediastinal/paraesophageal mass and right hilar soft tissue mass. 2. Slight interval decrease in size of the right upper lobe pleural lesions. 3. Progressive soft tissue density in the right upper lobe medially most likely progressive changes of radiation fibrosis but recommend attention on future studies. 4. Enlarging right pleural effusion. 5. Stable 8 mm left upper lobe pulmonary nodule. No new pulmonary nodules. 6. No findings for abdominal/pelvic metastatic disease. 7. Stable metastatic bone lesions.  No new or progressive findings. 8. Healing open wound in the left hip area. *  Tracking Code: BO * Aortic Atherosclerosis (ICD10-I70.0) and Emphysema (ICD10-J43.9). Electronically  Signed   By: Marijo Sanes M.D.   On: 07/27/2021 09:48   IR IMAGING GUIDED PORT INSERTION  Result Date: 07/19/2021 CLINICAL DATA:  LUNG CANCER, ACCESS FOR CHEMOTHERAPY EXAM: RIGHT INTERNAL JUGULAR SINGLE LUMEN POWER PORT CATHETER INSERTION Date:  07/19/2021 07/19/2021 2:30 pm Radiologist:  M. Daryll Brod, MD Guidance:  Ultrasound fluoroscopic MEDICATIONS: 1% lidocaine with epinephrine for local ANESTHESIA/SEDATION: Versed 2.0 mg IV; Fentanyl 100 mcg IV; Moderate Sedation Time:  24 minutes The patient was continuously monitored during the procedure by the interventional radiology nurse under my direct supervision. FLUOROSCOPY: (4.0 mGy) COMPLICATIONS: None immediate. CONTRAST:  None. PROCEDURE: Informed consent was obtained from the patient following explanation of the procedure, risks, benefits and alternatives. The patient understands, agrees and consents for the procedure. All questions were addressed. A time out was performed. Maximal barrier sterile technique utilized including caps, mask, sterile gowns, sterile gloves, large sterile drape, hand hygiene, and 2% chlorhexidine scrub. Under sterile conditions and local anesthesia, right internal jugular micropuncture venous access was performed. Access was performed with ultrasound. Images were obtained for documentation of the patent right internal jugular vein. A guide wire was inserted followed by a transitional dilator. This allowed insertion of a guide wire and catheter into the IVC. Measurements were obtained from the SVC / RA junction back to the right IJ venotomy site. In the right infraclavicular chest, a subcutaneous pocket was created over the second anterior rib. This was done under sterile conditions and local anesthesia. 1% lidocaine with epinephrine was utilized for this. A 2.5 cm incision was made in the skin. Blunt dissection was performed to  create a subcutaneous pocket over the right pectoralis major muscle. The pocket was flushed with saline vigorously. There was adequate hemostasis. The port catheter was assembled and checked for leakage. The port catheter was secured in the pocket with two retention sutures. The tubing was tunneled subcutaneously to the right venotomy site and inserted into the SVC/RA junction through a valved peel-away sheath. Position was confirmed with fluoroscopy. Images were obtained for documentation. The patient tolerated the procedure well. No immediate complications. Incisions were closed in a two layer fashion with 4 - 0 Vicryl suture. Dermabond was applied to the skin. The port catheter was accessed, blood was aspirated followed by saline and heparin flushes. Needle was removed. A dry sterile dressing was applied. IMPRESSION: Ultrasound and fluoroscopically guided right internal jugular single lumen power port catheter insertion. Tip in the SVC/RA junction. Catheter ready for use. Electronically Signed   By: Jerilynn Mages.  Shick M.D.   On: 07/19/2021 14:43    ASSESSMENT AND PLAN: This is a very pleasant 66 years old African-American male recently diagnosed with limited stage small cell lung cancer in February 2022.  The patient is currently undergoing treatment according to the [place] clinical trial where the patient would be randomized to treatment with concurrent chemoradiation with cisplatin and/or etoposide versus concurrent chemoradiation with the same regimen in addition to atezolizumab. He is status post 3 cycles of the treatment.  His treatment was discontinued secondary to intolerance and the patient has been in observation since April 2022. He had MRI of the brain performed recently that showed no evidence of metastatic disease to the brain. Repeat CT scan of the chest, abdomen and pelvis unfortunately showed evidence for disease progression involving new mass centered in the superior aspect of the right major  fissure, widespread nodularity in the adjacent portion of the upper right lung, multiple new pleural-based lesions in the right  hemithorax, new middle mediastinal lymphadenopathy, new hypervascular lesion in the proximal body of the pancreas and new lytic lesions in the parasymphyseal region of the left to pubic bone. His insurance declined a PET scan. The patient is started palliative systemic chemotherapy with carboplatin for AUC of 5 on day 1, etoposide 100 Mg/M2 on days 1, 2 and 3 with Cosela 4 Myeloprotection before chemotherapy and Imfinzi 1500 Mg IV every 3 weeks with the induction phase followed by maintenance Imfinzi every 4 weeks if the patient has no evidence for disease progression.  He is status post 5 cycles.  Starting from cycle #5 he will be on treatment with Imfinzi every 4 weeks.  His treatment was discontinued after cycle #5 secondary to disease progression. The patient is currently undergoing a third line treatment with Zepzelca (lurbinectedin) 3.2 Mg/M2 every 3 weeks status post 3 cycles.  He was admitted to the hospital after cycle #2 with significant chemotherapy-induced pancytopenia and neutropenic fever as well as left hip subcutaneous abscess.  The patient had drainage of the abscess in the hospital with wound VAC but this was discontinued recently after improvement of his condition. He tolerated cycle #3 of his treatment well with no concerning adverse effects except for mild fatigue. I recommended for him to proceed with cycle #4 today as planned. For the chemotherapy-induced anemia, we will continue to monitor his hemoglobin and hematocrit closely and consider him for PRBCs transfusion as needed. The patient will come back for follow-up visit in 3 weeks for evaluation before the next cycle of his treatment. He was advised to call immediately if he has any other concerning symptoms in the interval. The patient voices understanding of current disease status and treatment options  and is in agreement with the current care plan.  All questions were answered. The patient knows to call the clinic with any problems, questions or concerns. We can certainly see the patient much sooner if necessary.  The total time spent in the appointment was 30 minutes.  Disclaimer: This note was dictated with voice recognition software. Similar sounding words can inadvertently be transcribed and may not be corrected upon review.

## 2021-08-17 NOTE — Progress Notes (Signed)
Nutrition follow-up completed with patient during infusion for metastatic lung cancer.  Weight improved and was documented as 169 pounds 8 ounces on August 10.  Labs were reviewed.  Patient reports his appetite has increased. He continues to drink Ensure plus high-protein and reports 2 cartons daily. He denies nutrition impact symptoms. He would appreciate a case of Ensure Plus high-protein today.  Nutrition diagnosis: Inadequate oral intake improving.  Intervention: Continue strategies for increasing calories and protein. Continue Ensure Plus high-protein, 2 cartons daily. Provided 1 complementary case of Ensure Plus high-protein.  Monitoring, evaluation, goals: Patient will tolerate adequate calories and protein to minimize weight loss.  Next visit: Thursday, September 21 during infusion.  **Disclaimer: This note was dictated with voice recognition software. Similar sounding words can inadvertently be transcribed and this note may contain transcription errors which may not have been corrected upon publication of note.**

## 2021-08-17 NOTE — Progress Notes (Signed)
[place]  Telephone:(336) [phone] Fax:(336) [phone]   Name: Adam [name] Date: 08/17/2021 MRN: [id number]  DOB: 1955-02-02  Patient Care Team: [place], Blondell Reveal, MD as PCP - General (Family Medicine) [name], RN (Inactive) as Registered Nurse Curt Bears, MD as Consulting Physician (Oncology)    INTERVAL HISTORY: Adam [name] is a 66 y.o. 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  Palliative ask to see for symptom management and goals of care.   SOCIAL HISTORY:     reports that he quit smoking about 3 years ago. His smoking use included cigarettes. He has a 20.00 pack-year smoking history. He has never used smokeless tobacco. He reports that he does not currently use alcohol. He reports that he does not use drugs.  ADVANCE DIRECTIVES:  Patient does not have a documented advanced directive.  Is interested in completing.  Education provided, advanced directive packet and clinic dates made available.  CODE STATUS: DNR  PAST MEDICAL HISTORY: Past Medical History:  Diagnosis Date   Hypertension    Lung cancer ([place])    small cell RUL    ALLERGIES:  has No Known Allergies.  MEDICATIONS:  Current Outpatient Medications  Medication Sig Dispense Refill   acetaminophen (TYLENOL) 650 MG CR tablet Take 650 mg by mouth every 8 (eight) hours as needed for pain.     bisacodyl (DULCOLAX) 5 MG EC tablet Take 5 mg by mouth daily as needed for moderate constipation.     collagenase (SANTYL) 250 UNIT/GM ointment Apply topically daily. 30 g 1   lidocaine-prilocaine (EMLA) cream Apply 1 application topically as needed. (Patient taking differently: Apply 1 application  topically as needed (to access port).) 30 g 2   mirtazapine (REMERON) 15 MG tablet Take 2 tablets (30 mg total) by mouth at bedtime.  (Patient not taking: Reported on 07/27/2021) 30 tablet 2   No current facility-administered medications for this visit.   Facility-Administered Medications Ordered in Other Visits  Medication Dose Route Frequency Provider Last Rate Last Admin   [COMPLETED] heparin lock flush 100 unit/mL  500 Units Intracatheter Once PRN Curt Bears, MD   500 Units at 08/17/21 1240   palonosetron (ALOXI) 0.25 MG/5ML injection            sodium chloride flush (NS) 0.9 % injection 10 mL  10 mL Intracatheter PRN Curt Bears, MD   10 mL at 08/17/21 1240    VITAL SIGNS: There were no vitals taken for this visit. There were no vitals filed for this visit.  Estimated body mass index is 25.03 kg/m as calculated from the following:   Height as of 07/27/21: 5\' 9"  (1.753 m).   Weight as of an earlier encounter on 08/17/21: 169 lb 8 oz (76.9 kg).   PERFORMANCE STATUS (ECOG) : 0 - Asymptomatic   Physical Exam General: NAD, sitting upright in recliner  Pulmonary: normal breathing pattern  Extremities: no edema, no joint deformities Skin: no rashes Neurological: AAO x3, mood appropriate   IMPRESSION: I saw Mr. Schauer during his infusion.  No acute distress noted.  Sitting upright in recliner tolerating treatment.  States he has been feeling well which she is appreciative of.  Denies any nausea, vomiting, diarrhea, or constipation.  Occasional aches which is manageable with Tylenol or ibuprofen.  No symptom management needs at this time.  Is  appreciative of his current quality of life.  States most days he cannot tell that he is undergoing treatment.  Reports his appetite is well and he is tolerating 1-2 ensures daily.  Weight is maintaining at 169 pounds up from 168 pounds on 7/20.  I am happy he is doing well.  He understands we are available for additional support as needed.  Verbalizes appreciation.  PLAN: Ongoing support.  No symptom management needs at this time. I will plan to see patient back in  8 weeks.  He knows to contact our office sooner if needed.   Patient expressed understanding and was in agreement with this plan. He also understands that He can call the clinic at any time with any questions, concerns, or complaints.     Time Total: 15 min   Visit consisted of counseling and education dealing with the complex and emotionally intense issues of symptom management and palliative care in the setting of serious and potentially life-threatening illness.Greater than 50%  of this time was spent counseling and coordinating care related to the above assessment and plan.  Signed by: Alda Lea, AGPCNP-BC [place]

## 2021-08-17 NOTE — Patient Instructions (Signed)
[facility]  Discharge Instructions: Thank you for choosing [facility] to provide your oncology and hematology care.   If you have a lab appointment with the [facility], please go directly to the [facility] and check in at the registration area.   Wear comfortable clothing and clothing appropriate for easy access to any Portacath or PICC line.   We strive to give you quality time with your provider. You may need to reschedule your appointment if you arrive late (15 or more minutes).  Arriving late affects you and other patients whose appointments are after yours.  Also, if you miss three or more appointments without notifying the office, you may be dismissed from the clinic at the provider's discretion.      For prescription refill requests, have your pharmacy contact our office and allow 72 hours for refills to be completed.    Today you received the following chemotherapy and/or immunotherapy agents: Zepzelca      To help prevent nausea and vomiting after your treatment, we encourage you to take your nausea medication as directed.  BELOW ARE SYMPTOMS THAT SHOULD BE REPORTED IMMEDIATELY: *FEVER GREATER THAN 100.4 F (38 C) OR HIGHER *CHILLS OR SWEATING *NAUSEA AND VOMITING THAT IS NOT CONTROLLED WITH YOUR NAUSEA MEDICATION *UNUSUAL SHORTNESS OF BREATH *UNUSUAL BRUISING OR BLEEDING *URINARY PROBLEMS (pain or burning when urinating, or frequent urination) *BOWEL PROBLEMS (unusual diarrhea, constipation, pain near the anus) TENDERNESS IN MOUTH AND THROAT WITH OR WITHOUT PRESENCE OF ULCERS (sore throat, sores in mouth, or a toothache) UNUSUAL RASH, SWELLING OR PAIN  UNUSUAL VAGINAL DISCHARGE OR ITCHING   Items with * indicate a potential emergency and should be followed up as soon as possible or go to the Emergency Department if any problems should occur.  Please show the CHEMOTHERAPY ALERT CARD or IMMUNOTHERAPY ALERT CARD at check-in to  the Emergency Department and triage nurse.  Should you have questions after your visit or need to cancel or reschedule your appointment, please contact [facility]  Dept: [phone]  and follow the prompts.  Office hours are 8:00 a.m. to 4:30 p.m. Monday - Friday. Please note that voicemails left after 4:00 p.m. may not be returned until the following business day.  We are closed weekends and major holidays. You have access to a nurse at all times for urgent questions. Please call the main number to the clinic Dept: [phone] and follow the prompts.   For any non-urgent questions, you may also contact your provider using MyChart. We now offer e-Visits for anyone 77 and older to request care online for non-urgent symptoms. For details visit [email or web address].   Also download the MyChart app! Go to the app store, search "MyChart", open the app, select [facility], and log in with your MyChart username and password.  Masks are optional in the cancer centers. If you would like for your care team to wear a mask while they are taking care of you, please let them know. You may have one support person who is at least 64 years old accompany you for your appointments.

## 2021-08-17 NOTE — Progress Notes (Signed)
Per Dr. Julien Nordmann , it is ok to treat pt today with Lurbinectedin and WBC-2.7 and ANC =1.5.

## 2021-08-17 NOTE — Research (Signed)
TRIAL: NRG-LU005-Limited Stage Small Cell Lung Cancer (LS-SCLC): A Phase II/III Randomized Study of Chemoradiation Versus Chemoradiation Plus Atezolizumab   15 MONTH POST RT VISIT   Patient arrives today Unaccompanied for the 15 month post RT visit.    PROs: Patient completed study required PROs.     LABS: Used SOC labs drawn today for assessment (CMP, TSH, and CBC).  Patient LEXANDER TREMBLAY tolerated well without complaint.  Results discussed below in AE section.   MEDICATION REVIEW: Patient reviews and verifies the current medication list is correct.  Reported changes were recorded on the medication list and marked as reviewed. All other listed medications remain the same.   VITAL SIGNS: Vital signs are collected per study protocol during MD [facility] appt today. BP 149/104 HR 79 bpm Resp 19/min Temp 97.4 O2 98% on room air Wt 169 lb 8 oz Discussed further below in AE section.   MD/PROVIDER VISIT: Patient sees MD [facility] for today's visit.  See MD note for more details.   ADVERSE EVENTS: USING CTCAE V. 5.0 Patient was admitted to hospital for severe sepsis, dehydration, abscess left leg and left arm, and chemotherapy induced neutropenia on 05/17/21, discharged on 06/02/21.  See 05/18/21 Research note, all AE's found in this admission already ruled as unrelated to study per MD [facility]. Patient has a baseline history of hypertension (Grade 2) previously managed with medication- patient has been off BP medication since he started seeing wound care.  Patient is going to consult with PCP about restarting BP meds. Patient also has a baseline history of anemia, Hgb value today is 9.8 (Grade 2). Tachycardia resolved. Left hip pain/arthralgia resolved about a month ago. Loss of appetite/anorexia improved to Grade 1 per patient. Weight loss resolved. Fatigue and depression ongoing and unchanged. Labs drawn today (08/17/21) showed the following: White blood cell decreased (Grade 2), no  intervention, unrelated to study per MD, no action/reporting required. Platelet count decreased (Grade 1), no intervention, unrelated to study per MD, no action/reporting required. Neutrophil count decreased (Grade 1), no intervention, unrelated to study per MD, no action/reporting required. Hyperglycemia, hypokalemia, and hypoalbuminemia all resolved. All other abnormal labs drawn today (as well as during period between this visit and last Research visit on 05/10/21) have been ruled clinically irrelevant per MD.  No action or reporting required.   SOLICITED ADVERSE EVENTS:             Patient denies any of the protocol required solicited adverse events: pneumonitis, pericarditis, esophagitis, and encephalitis.  No action or reporting at this time required.   ADVERSE EVENT LOGKEILYN NADAL [id number] #[id number]   08/17/2021   Adverse Event Log   Study/Protocol: NRG-LU005 Cycle: 15 month post RT   Event Grade Onset Date Resolved Date Drug Name Attribution Treatment Comments  Hyperglycemia Grade 1 05/10/21 08/17/21 Resolved None Unrelated None    Hypokalemia Grade 1 05/10/21 08/17/21 Resolved None Unrelated Oral and IV K+    Hypoalbuminemia Grade 2 05/10/21 08/17/21 Resolved None Unrelated None    Tachycardia Grade 1 03/15/21 08/17/21 Resolved None Unrelated None    Arthralgia (left hip pain) Grade 1 02/22/21 08/17/21 Resolved None Unrelated None    Anemia Grade 3 05/10/21 08/17/21 Resolved None Unrelated Transfusion    Weight loss Grade 2 03/15/21 08/17/21 Resolved None Unrelated Supplements and nutritionist consult    Anorexia Grade 2 04/26/21 08/17/21 Resolved None Unrelated Remeron and supplemental nutrition    Fatigue Grade 2 04/26/21 Ongoing None Unrelated Remeron and steroids  Depression Grade 2 05/10/21 Ongoing None Unrelated Remeron    Neutrophil count decreased Grade 1 08/17/21 Ongoing None Unrelated None   Platelet count decreased Grade 1 08/17/21 Ongoing None Unrelated None   White blood cell  count decreased Grade 2 08/17/21 Ongoing None Unrelated None   Anorexia Grade 1 08/17/21 Ongoing None Unrelated None       DISPOSITION: Upon completion off all study requirements, patient was escorted to exit with belongings.  Will f/u with patient at next visit in 3 months.   IMAGING & DISEASE STATUS:             Restaging CT Chest/Abd/Pelvis was done on 07/26/21 and discussed with PA Cassie/MD Mohamed on 07/27/21.  The scan showed stable to slightly decreased size of left mediastinal/paraesophageal mass and right hilar soft tissue mass, plus a slight interval decrease in the right upper lobe pleural lesions.  Patient continuing second line chemotherapy with Zepzelca with lowered dose of 2.6 mg/m2 IV every 3 weeks.  Patient not receiving any study intervention at this time (Arm 1), remains on study.   The patient was thanked for their time and continued voluntary participation in this study. Patient KYE HEDDEN has been provided direct contact information and is encouraged to contact this Nurse for any needs or questions.  Wells Guiles 'Learta CoddingNeysa Bonito, RN, BSN Clinical Research Nurse I 08/17/21 10:48 AM

## 2021-09-06 MED FILL — Dexamethasone Sodium Phosphate Inj 100 MG/10ML: INTRAMUSCULAR | Qty: 1 | Status: AC

## 2021-09-07 ENCOUNTER — Other Ambulatory Visit: Payer: Commercial Managed Care - HMO

## 2021-09-07 ENCOUNTER — Inpatient Hospital Stay: Payer: Commercial Managed Care - HMO

## 2021-09-07 ENCOUNTER — Encounter: Payer: Self-pay | Admitting: Internal Medicine

## 2021-09-07 ENCOUNTER — Inpatient Hospital Stay (HOSPITAL_BASED_OUTPATIENT_CLINIC_OR_DEPARTMENT_OTHER): Payer: Commercial Managed Care - HMO | Admitting: Internal Medicine

## 2021-09-07 ENCOUNTER — Ambulatory Visit: Payer: Commercial Managed Care - HMO

## 2021-09-07 ENCOUNTER — Ambulatory Visit: Payer: Commercial Managed Care - HMO | Admitting: Physician Assistant

## 2021-09-07 ENCOUNTER — Other Ambulatory Visit: Payer: Self-pay

## 2021-09-07 VITALS — BP 110/72 | HR 98 | Resp 16

## 2021-09-07 DIAGNOSIS — C3411 Malignant neoplasm of upper lobe, right bronchus or lung: Secondary | ICD-10-CM | POA: Diagnosis not present

## 2021-09-07 DIAGNOSIS — Z95828 Presence of other vascular implants and grafts: Secondary | ICD-10-CM

## 2021-09-07 DIAGNOSIS — C3491 Malignant neoplasm of unspecified part of right bronchus or lung: Secondary | ICD-10-CM

## 2021-09-07 LAB — CMP (CANCER CENTER ONLY)
ALT: 7 U/L (ref 0–44)
AST: 15 U/L (ref 15–41)
Albumin: 3.7 g/dL (ref 3.5–5.0)
Alkaline Phosphatase: 65 U/L (ref 38–126)
Anion gap: 5 (ref 5–15)
BUN: 12 mg/dL (ref 8–23)
CO2: 29 mmol/L (ref 22–32)
Calcium: 10 mg/dL (ref 8.9–10.3)
Chloride: 102 mmol/L (ref 98–111)
Creatinine: 0.81 mg/dL (ref 0.61–1.24)
GFR, Estimated: >60 mL/min (ref >60)
Glucose, Bld: 138 mg/dL — ABNORMAL HIGH (ref 70–99)
Potassium: 3.5 mmol/L (ref 3.5–5.1)
Sodium: 136 mmol/L (ref 135–145)
Total Bilirubin: 0.3 mg/dL (ref 0.3–1.2)
Total Protein: 7.5 g/dL (ref 6.5–8.1)

## 2021-09-07 LAB — CBC WITH DIFFERENTIAL (CANCER CENTER ONLY)
Abs Immature Granulocytes: 0.01 10*3/uL (ref 0.00–0.07)
Basophils Absolute: 0 10*3/uL (ref 0.0–0.1)
Basophils Relative: 1 %
Eosinophils Absolute: 0 10*3/uL (ref 0.0–0.5)
Eosinophils Relative: 1 %
HCT: 29.4 % — ABNORMAL LOW (ref 39.0–52.0)
Hemoglobin: 9.9 g/dL — ABNORMAL LOW (ref 13.0–17.0)
Immature Granulocytes: 0 %
Lymphocytes Relative: 23 %
Lymphs Abs: 0.7 10*3/uL (ref 0.7–4.0)
MCH: 30.6 pg (ref 26.0–34.0)
MCHC: 33.7 g/dL (ref 30.0–36.0)
MCV: 90.7 fL (ref 80.0–100.0)
Monocytes Absolute: 0.7 10*3/uL (ref 0.1–1.0)
Monocytes Relative: 24 %
Neutro Abs: 1.6 10*3/uL — ABNORMAL LOW (ref 1.7–7.7)
Neutrophils Relative %: 51 %
Platelet Count: 187 10*3/uL (ref 150–400)
RBC: 3.24 MIL/uL — ABNORMAL LOW (ref 4.22–5.81)
RDW: 14.9 % (ref 11.5–15.5)
WBC Count: 3.1 10*3/uL — ABNORMAL LOW (ref 4.0–10.5)
nRBC: 0 % (ref 0.0–0.2)

## 2021-09-07 MED ORDER — PALONOSETRON HCL INJECTION 0.25 MG/5ML
0.2500 mg | Freq: Once | INTRAVENOUS | Status: AC
  Administered 2021-09-07: 0.25 mg via INTRAVENOUS
  Filled 2021-09-07: qty 5

## 2021-09-07 MED ORDER — HEPARIN SOD (PORK) LOCK FLUSH 100 UNIT/ML IV SOLN
500.0000 [IU] | Freq: Once | INTRAVENOUS | Status: AC | PRN
  Administered 2021-09-07: 500 [IU]

## 2021-09-07 MED ORDER — SODIUM CHLORIDE 0.9% FLUSH
10.0000 mL | Freq: Once | INTRAVENOUS | Status: AC
  Administered 2021-09-07: 10 mL

## 2021-09-07 MED ORDER — SODIUM CHLORIDE 0.9 % IV SOLN
10.0000 mg | Freq: Once | INTRAVENOUS | Status: AC
  Administered 2021-09-07: 10 mg via INTRAVENOUS
  Filled 2021-09-07: qty 10

## 2021-09-07 MED ORDER — SODIUM CHLORIDE 0.9% FLUSH
10.0000 mL | INTRAVENOUS | Status: DC | PRN
  Administered 2021-09-07: 10 mL

## 2021-09-07 MED ORDER — SODIUM CHLORIDE 0.9 % IV SOLN: Freq: Once | INTRAVENOUS | Status: AC

## 2021-09-07 MED ORDER — SODIUM CHLORIDE 0.9 % IV SOLN
2.6000 mg/m2 | Freq: Once | INTRAVENOUS | Status: AC
  Administered 2021-09-07: 5 mg via INTRAVENOUS
  Filled 2021-09-07: qty 10

## 2021-09-07 NOTE — Progress Notes (Signed)
[facility] [phone]   Fax:(336) [phone]  OFFICE PROGRESS NOTE  de [place], [name], MD [address] [place] [address]  DIAGNOSIS: Metastatic small cell lung cancer initially diagnosed as Limited stage (T1c, N2, M0) small cell lung cancer diagnosed in February 2022.  PRIOR THERAPY:  1) Systemic chemotherapy according to phase 3 randomized clinical trial of chemoradiation versus chemoradiation plus atezolizumab status post 3 cycles.  This treatment was discontinued secondary to intolerance. 2) palliative radiotherapy to the left hip area under the care of Dr. Tammi Klippel completed January 03, 2021. 3) Second line systemic chemotherapy with carboplatin for AUC of 5 on day 1, etoposide 100 Mg/M2 on days 1, 2 and 3 as well as Imfinzi 1500 Mg IV on day 1 and Cosela 240 Mg/M2 before chemotherapy on days 1, 2 and 3 every 3 weeks.  First dose December 13, 2020.  Status post 5 cycles.  Starting from cycle #5 the patient will be on maintenance treatment with Imfinzi every 4 weeks.  This was discontinued secondary to disease progression  CURRENT THERAPY: Third line systemic chemotherapy with Zepzelca (lurbinectedin) 3.2 Mg/M2 IV every 3 weeks.  First dose 04/19/2021.  Status post 4 cycles.  The dose of Zepzelca (lurbinectedin) was reduced to 2.6 Mg/M2 starting from cycle #3.  INTERVAL HISTORY: Adam [name] 66 y.o. male returns to the clinic today for follow-up visit.  The patient is feeling fine today with no concerning complaints except for occasional right shoulder blade and arm pain probably secondary to muscle strain.  He will use over the counter medication and it helped with his pain management.  He has no chest pain, shortness of breath except with exertion with no cough or hemoptysis.  He has no nausea, vomiting, diarrhea or constipation.  He has no headache or visual changes.  He has no recent weight loss or night sweats.  He continues to tolerate his  treatment with Zepzelca (lurbinectedin) fairly well.  The patient had repeat CT scan of the chest, abdomen and pelvis performed recently and he is here for evaluation and discussion of his scan results and treatment options.   MEDICAL HISTORY: Past Medical History:  Diagnosis Date   Hypertension    Lung cancer ([place])    small cell RUL    ALLERGIES:  has No Known Allergies.  MEDICATIONS:  Current Outpatient Medications  Medication Sig Dispense Refill   acetaminophen (TYLENOL) 650 MG CR tablet Take 650 mg by mouth every 8 (eight) hours as needed for pain.     bisacodyl (DULCOLAX) 5 MG EC tablet Take 5 mg by mouth daily as needed for moderate constipation.     collagenase (SANTYL) 250 UNIT/GM ointment Apply topically daily. 30 g 1   lidocaine-prilocaine (EMLA) cream Apply 1 application topically as needed. (Patient taking differently: Apply 1 application  topically as needed (to access port).) 30 g 2   mirtazapine (REMERON) 15 MG tablet Take 2 tablets (30 mg total) by mouth at bedtime. (Patient not taking: Reported on 07/27/2021) 30 tablet 2   No current facility-administered medications for this visit.    SURGICAL HISTORY:  Past Surgical History:  Procedure Laterality Date   BRONCHIAL BRUSHINGS  02/08/2020   Procedure: BRONCHIAL BRUSHINGS;  Surgeon: Freddi Starr, MD;  Location: [facility];  Service: Pulmonary;;   BRONCHIAL NEEDLE ASPIRATION BIOPSY  02/08/2020   Procedure: BRONCHIAL NEEDLE ASPIRATION BIOPSIES;  Surgeon: Freddi Starr, MD;  [facility] ENDOSCOPY;  Service: Pulmonary;;  INCISION AND DRAINAGE ABSCESS Left 05/31/2021   Procedure: Left Hip Excision;  Surgeon: Michael Boston, MD;  Location: [facility] ORS;  Service: General;  Laterality: Left;   IR IMAGING GUIDED PORT INSERTION  12/30/2020   IR IMAGING GUIDED PORT INSERTION  07/19/2021   IR REMOVAL TUN ACCESS W/ PORT W/O FL MOD SED  05/26/2021   KNEE SURGERY Right    SHOULDER SURGERY Left    VIDEO BRONCHOSCOPY WITH  ENDOBRONCHIAL ULTRASOUND N/A 02/08/2020   Procedure: VIDEO BRONCHOSCOPY WITH ENDOBRONCHIAL ULTRASOUND;  Surgeon: Freddi Starr, MD;  Location: [facility];  Service: Pulmonary;  Laterality: N/A;    REVIEW OF SYSTEMS:  Constitutional: positive for fatigue Eyes: negative Ears, nose, mouth, throat, and face: negative Respiratory: negative Cardiovascular: negative Gastrointestinal: negative Genitourinary:negative Integument/breast: negative Hematologic/lymphatic: negative Musculoskeletal:positive for muscle weakness Neurological: negative Behavioral/Psych: negative Endocrine: negative Allergic/Immunologic: negative   PHYSICAL EXAMINATION: General appearance: alert, cooperative, fatigued, and no distress Head: Normocephalic, without obvious abnormality, atraumatic Neck: no adenopathy, no JVD, supple, symmetrical, trachea midline, and thyroid not enlarged, symmetric, no tenderness/mass/nodules Lymph nodes: Cervical, supraclavicular, and axillary nodes normal. Resp: clear to auscultation bilaterally Back: symmetric, no curvature. ROM normal. No CVA tenderness. Cardio: regular rate and rhythm, S1, S2 normal, no murmur, click, rub or gallop GI: soft, non-tender; bowel sounds normal; no masses,  no organomegaly Extremities: extremities normal, atraumatic, no cyanosis or edema Neurologic: Alert and oriented X 3, normal strength and tone. Normal symmetric reflexes. Normal coordination and gait  ECOG PERFORMANCE STATUS: 1 - Symptomatic but completely ambulatory  Blood pressure 110/78, pulse (!) 110, temperature 98 F (36.7 C), temperature source Oral, resp. rate 15, weight 172 lb 1.6 oz (78.1 kg), SpO2 99 %.  LABORATORY DATA: Lab Results  Component Value Date   WBC 3.1 (L) 09/07/2021   HGB 9.9 (L) 09/07/2021   HCT 29.4 (L) 09/07/2021   MCV 90.7 09/07/2021   PLT 187 09/07/2021      Chemistry      Component Value Date/Time   NA 136 09/07/2021 0840   K 3.5 09/07/2021 0840   CL  102 09/07/2021 0840   CO2 29 09/07/2021 0840   BUN 12 09/07/2021 0840   CREATININE 0.81 09/07/2021 0840      Component Value Date/Time   CALCIUM 10.0 09/07/2021 0840   ALKPHOS 65 09/07/2021 0840   AST 15 09/07/2021 0840   ALT 7 09/07/2021 0840   BILITOT 0.3 09/07/2021 0840       RADIOGRAPHIC STUDIES: No results found.  ASSESSMENT AND PLAN: This is a very pleasant 66 years old African-American male recently diagnosed with limited stage small cell lung cancer in February 2022.  The patient is currently undergoing treatment according to the [place] clinical trial where the patient would be randomized to treatment with concurrent chemoradiation with cisplatin and/or etoposide versus concurrent chemoradiation with the same regimen in addition to atezolizumab. He is status post 3 cycles of the treatment.  His treatment was discontinued secondary to intolerance and the patient has been in observation since April 2022. He had MRI of the brain performed recently that showed no evidence of metastatic disease to the brain. Repeat CT scan of the chest, abdomen and pelvis unfortunately showed evidence for disease progression involving new mass centered in the superior aspect of the right major fissure, widespread nodularity in the adjacent portion of the upper right lung, multiple new pleural-based lesions in the right hemithorax, new middle mediastinal lymphadenopathy, new hypervascular lesion in the proximal body of the  pancreas and new lytic lesions in the parasymphyseal region of the left to pubic bone. His insurance declined a PET scan. The patient is started palliative systemic chemotherapy with carboplatin for AUC of 5 on day 1, etoposide 100 Mg/M2 on days 1, 2 and 3 with Cosela 4 Myeloprotection before chemotherapy and Imfinzi 1500 Mg IV every 3 weeks with the induction phase followed by maintenance Imfinzi every 4 weeks if the patient has no evidence for disease progression.  He is status post 5  cycles.  Starting from cycle #5 he will be on treatment with Imfinzi every 4 weeks.  His treatment was discontinued after cycle #5 secondary to disease progression. The patient is currently undergoing a third line treatment with Zepzelca (lurbinectedin) 3.2 Mg/M2 every 3 weeks status post 4 cycles.  He was admitted to the hospital after cycle #2 with significant chemotherapy-induced pancytopenia and neutropenic fever as well as left hip subcutaneous abscess.  He tolerated the last cycle of his treatment fairly well with no concerning adverse effects. The patient had repeat CT scan of the chest, abdomen and pelvis performed recently.  I personally and independently reviewed the scan and discussed the result with the patient today. His scan showed no concerning findings for disease progression and there was some further mild improvement. I recommended for him to continue his current treatment with Zepzelca (lurbinectedin) 2.6 Mg/M2 every 3 weeks and he will proceed with cycle #5 today. For the chemotherapy-induced anemia, we will continue to monitor his hemoglobin and hematocrit closely and consider The patient for transfusion if needed. He will come back for follow-up visit in 3 weeks for evaluation before the next cycle of his treatment. The patient was advised to call immediately if he has any other concerning symptoms in the interval.   The patient voices understanding of current disease status and treatment options and is in agreement with the current care plan.  All questions were answered. The patient knows to call the clinic with any problems, questions or concerns. We can certainly see the patient much sooner if necessary.  The total time spent in the appointment was 30 minutes.  Disclaimer: This note was dictated with voice recognition software. Similar sounding words can inadvertently be transcribed and may not be corrected upon review.

## 2021-09-07 NOTE — Progress Notes (Signed)
Per Dr. Julien Nordmann , it is ok to treat pt today with  Lurbinectedin and heart rate of 110.

## 2021-09-07 NOTE — Patient Instructions (Signed)
[facility]COL[facility]  Discharge Instructions: Thank you for choosing [facility] to provide your oncology and hematology care.   If you have a lab appointment with the [facility], please go directly to the [facility] and check in at the registration area.   Wear comfortable clothing and clothing appropriate for easy access to any Portacath or PICC line.   We strive to give you quality time with your provider. You may need to reschedule your appointment if you arrive late (15 or more minutes).  Arriving late affects you and other patients whose appointments are after yours.  Also, if you miss three or more appointments without notifying the office, you may be dismissed from the clinic at the provider's discretion.      For prescription refill requests, have your pharmacy contact our office and allow 72 hours for refills to be completed.    Today you received the following chemotherapy and/or immunotherapy agents: zepzelca      To help prevent nausea and vomiting after your treatment, we encourage you to take your nausea medication as directed.  BELOW ARE SYMPTOMS THAT SHOULD BE REPORTED IMMEDIATELY: *FEVER GREATER THAN 100.4 F (38 C) OR HIGHER *CHILLS OR SWEATING *NAUSEA AND VOMITING THAT IS NOT CONTROLLED WITH YOUR NAUSEA MEDICATION *UNUSUAL SHORTNESS OF BREATH *UNUSUAL BRUISING OR BLEEDING *URINARY PROBLEMS (pain or burning when urinating, or frequent urination) *BOWEL PROBLEMS (unusual diarrhea, constipation, pain near the anus) TENDERNESS IN MOUTH AND THROAT WITH OR WITHOUT PRESENCE OF ULCERS (sore throat, sores in mouth, or a toothache) UNUSUAL RASH, SWELLING OR PAIN  UNUSUAL VAGINAL DISCHARGE OR ITCHING   Items with * indicate a potential emergency and should be followed up as soon as possible or go to the Emergency Department if any problems should occur.  Please show the CHEMOTHERAPY ALERT CARD or IMMUNOTHERAPY ALERT CARD at check-in to  the Emergency Department and triage nurse.  Should you have questions after your visit or need to cancel or reschedule your appointment, please contact [facility]  Dept: [phone]  and follow the prompts.  Office hours are 8:00 a.m. to 4:30 p.m. Monday - Friday. Please note that voicemails left after 4:00 p.m. may not be returned until the following business day.  We are closed weekends and major holidays. You have access to a nurse at all times for urgent questions. Please call the main number to the clinic Dept: [phone] and follow the prompts.   For any non-urgent questions, you may also contact your provider using MyChart. We now offer e-Visits for anyone 74 and older to request care online for non-urgent symptoms. For details visit [email or web address].   Also download the MyChart app! Go to the app store, search "MyChart", open the app, select , and log in with your MyChart username and password.  Masks are optional in the cancer centers. If you would like for your care team to wear a mask while they are taking care of you, please let them know. You may have one support person who is at least 64 years old accompany you for your appointments.

## 2021-09-22 ENCOUNTER — Encounter (HOSPITAL_BASED_OUTPATIENT_CLINIC_OR_DEPARTMENT_OTHER): Payer: Self-pay | Admitting: Family Medicine

## 2021-09-22 ENCOUNTER — Ambulatory Visit (INDEPENDENT_AMBULATORY_CARE_PROVIDER_SITE_OTHER): Payer: Commercial Managed Care - HMO | Admitting: Family Medicine

## 2021-09-22 VITALS — BP 123/89 | HR 107 | Ht 69.0 in | Wt 173.8 lb

## 2021-09-22 DIAGNOSIS — I10 Essential (primary) hypertension: Secondary | ICD-10-CM

## 2021-09-22 DIAGNOSIS — Z1211 Encounter for screening for malignant neoplasm of colon: Secondary | ICD-10-CM | POA: Diagnosis not present

## 2021-09-22 NOTE — Assessment & Plan Note (Signed)
Blood pressure at goal in office today, diastolic reading is borderline.  He is not currently taking any antihypertensives at this time, lisinopril-hydrochlorothiazide and amlodipine were stopped at time of most recent hospitalization a few months ago due to normal blood pressure readings around that time.  He continues to check his blood pressure intermittently at home, occasionally has had some high readings.  Blood pressure reading at office visits with various providers also reviewed today and these have generally been at goal with occasional slight elevated readings.  He has not had any issues with chest pain or headaches.  He has questions today about whether he should resume antihypertensive medications On exam, heart with regular rhythm, slightly tachycardic rate.  Lungs clear to auscultation bilaterally today Discussed options of both continued monitoring of blood pressure and holding antihypertensive agents versus restarting medications.  Given that blood pressure has generally been at goal, holding off on medications would be reasonable at this time, particularly with mild tachycardia as decrease in blood pressure could trigger more notable tachycardia.  He is not currently having any symptoms suggestive of uncontrolled blood pressure at home.  Recommend that he bring his blood pressure cuff to next office visit so that we may compare his home blood pressure cuff to readings that we are getting in the office with our machine We will plan for close follow-up to monitor in about 4 to 6 weeks If any issues arise before then, he may contact the office to schedule sooner evaluation

## 2021-09-22 NOTE — Patient Instructions (Signed)
  Medication Instructions:  Your physician recommends that you continue on your current medications as directed. Please refer to the Current Medication list given to you today. --If you need a refill on any your medications before your next appointment, please call your pharmacy first. If no refills are authorized on file call the office.-- Lab Work: Your physician has recommended that you have lab work today: No If you have labs (blood work) drawn today and your tests are completely normal, you will receive your results via [facility] a phone call from our staff.  Please ensure you check your voicemail in the event that you authorized detailed messages to be left on a delegated number. If you have any lab test that is abnormal or we need to change your treatment, we will call you to review the results.  Referrals/Procedures/Imaging: No  Follow-Up: Your next appointment:   Your physician recommends that you schedule a follow-up appointment in: 4-6 weeks with Dr. de [place].  You will receive a text message or e-mail with a link to a survey about your care and experience with [place] today! We would greatly appreciate your feedback!   Thanks for letting us be apart of your health journey!!  Primary Care and Sports Medicine   Dr. Arlina Robes [place]   We encourage you to activate your patient portal called "MyChart".  Sign up information is provided on this After Visit Summary.  MyChart is used to connect with patients for Virtual Visits (Telemedicine).  Patients are able to view lab/test results, encounter notes, upcoming appointments, etc.  Non-urgent messages can be sent to your provider as well. To learn more about what you can do with MyChart, please visit --  [email or web address].

## 2021-09-22 NOTE — Progress Notes (Signed)
    Procedures performed today:    None.  Independent interpretation of notes and tests performed by another provider:   None.  Brief History, Exam, Impression, and Recommendations:    BP 123/89   Pulse (!) 107   Ht 5\' 9"  (1.753 m)   Wt 173 lb 12.8 oz (78.8 kg)   SpO2 100%   BMI 25.67 kg/m   HTN (hypertension) Blood pressure at goal in office today, diastolic reading is borderline.  He is not currently taking any antihypertensives at this time, lisinopril-hydrochlorothiazide and amlodipine were stopped at time of most recent hospitalization a few months ago due to normal blood pressure readings around that time.  He continues to check his blood pressure intermittently at home, occasionally has had some high readings.  Blood pressure reading at office visits with various providers also reviewed today and these have generally been at goal with occasional slight elevated readings.  He has not had any issues with chest pain or headaches.  He has questions today about whether he should resume antihypertensive medications On exam, heart with regular rhythm, slightly tachycardic rate.  Lungs clear to auscultation bilaterally today Discussed options of both continued monitoring of blood pressure and holding antihypertensive agents versus restarting medications.  Given that blood pressure has generally been at goal, holding off on medications would be reasonable at this time, particularly with mild tachycardia as decrease in blood pressure could trigger more notable tachycardia.  He is not currently having any symptoms suggestive of uncontrolled blood pressure at home.  Recommend that he bring his blood pressure cuff to next office visit so that we may compare his home blood pressure cuff to readings that we are getting in the office with our machine We will plan for close follow-up to monitor in about 4 to 6 weeks If any issues arise before then, he may contact the office to schedule sooner  evaluation  Return in about 4 weeks (around 10/20/2021) for HTN.   ___________________________________________ Haille Pardi de [place], MD, ABFM, [facility] and [facility]

## 2021-09-27 MED FILL — Dexamethasone Sodium Phosphate Inj 100 MG/10ML: INTRAMUSCULAR | Qty: 1 | Status: AC

## 2021-09-28 ENCOUNTER — Inpatient Hospital Stay: Payer: Commercial Managed Care - HMO

## 2021-09-28 ENCOUNTER — Inpatient Hospital Stay: Payer: Commercial Managed Care - HMO | Admitting: Nurse Practitioner

## 2021-09-28 ENCOUNTER — Inpatient Hospital Stay (HOSPITAL_BASED_OUTPATIENT_CLINIC_OR_DEPARTMENT_OTHER): Payer: Commercial Managed Care - HMO

## 2021-09-28 ENCOUNTER — Inpatient Hospital Stay: Payer: Commercial Managed Care - HMO | Admitting: Nutrition

## 2021-09-28 ENCOUNTER — Inpatient Hospital Stay: Payer: Commercial Managed Care - HMO | Attending: Hematology and Oncology | Admitting: Internal Medicine

## 2021-09-28 ENCOUNTER — Other Ambulatory Visit: Payer: Self-pay

## 2021-09-28 VITALS — BP 112/83 | HR 110 | Temp 97.9°F | Resp 15 | Wt 172.5 lb

## 2021-09-28 VITALS — BP 125/89 | HR 109 | Temp 97.6°F | Resp 15

## 2021-09-28 DIAGNOSIS — C3490 Malignant neoplasm of unspecified part of unspecified bronchus or lung: Secondary | ICD-10-CM | POA: Diagnosis not present

## 2021-09-28 DIAGNOSIS — D6481 Anemia due to antineoplastic chemotherapy: Secondary | ICD-10-CM | POA: Insufficient documentation

## 2021-09-28 DIAGNOSIS — Z5111 Encounter for antineoplastic chemotherapy: Secondary | ICD-10-CM | POA: Diagnosis present

## 2021-09-28 DIAGNOSIS — C3411 Malignant neoplasm of upper lobe, right bronchus or lung: Secondary | ICD-10-CM

## 2021-09-28 DIAGNOSIS — I10 Essential (primary) hypertension: Secondary | ICD-10-CM | POA: Insufficient documentation

## 2021-09-28 LAB — CMP (CANCER CENTER ONLY)
ALT: 6 U/L (ref 0–44)
AST: 14 U/L — ABNORMAL LOW (ref 15–41)
Albumin: 3.7 g/dL (ref 3.5–5.0)
Alkaline Phosphatase: 66 U/L (ref 38–126)
Anion gap: 6 (ref 5–15)
BUN: 8 mg/dL (ref 8–23)
CO2: 28 mmol/L (ref 22–32)
Calcium: 9.4 mg/dL (ref 8.9–10.3)
Chloride: 102 mmol/L (ref 98–111)
Creatinine: 0.85 mg/dL (ref 0.61–1.24)
GFR, Estimated: >60 mL/min (ref >60)
Glucose, Bld: 95 mg/dL (ref 70–99)
Potassium: 3.2 mmol/L — ABNORMAL LOW (ref 3.5–5.1)
Sodium: 136 mmol/L (ref 135–145)
Total Bilirubin: 0.3 mg/dL (ref 0.3–1.2)
Total Protein: 7.3 g/dL (ref 6.5–8.1)

## 2021-09-28 LAB — CBC WITH DIFFERENTIAL (CANCER CENTER ONLY)
Abs Immature Granulocytes: 0.02 10*3/uL (ref 0.00–0.07)
Basophils Absolute: 0 10*3/uL (ref 0.0–0.1)
Basophils Relative: 0 %
Eosinophils Absolute: 0 10*3/uL (ref 0.0–0.5)
Eosinophils Relative: 1 %
HCT: 28.2 % — ABNORMAL LOW (ref 39.0–52.0)
Hemoglobin: 9.5 g/dL — ABNORMAL LOW (ref 13.0–17.0)
Immature Granulocytes: 1 %
Lymphocytes Relative: 14 %
Lymphs Abs: 0.6 10*3/uL — ABNORMAL LOW (ref 0.7–4.0)
MCH: 29.8 pg (ref 26.0–34.0)
MCHC: 33.7 g/dL (ref 30.0–36.0)
MCV: 88.4 fL (ref 80.0–100.0)
Monocytes Absolute: 0.6 10*3/uL (ref 0.1–1.0)
Monocytes Relative: 16 %
Neutro Abs: 2.7 10*3/uL (ref 1.7–7.7)
Neutrophils Relative %: 68 %
Platelet Count: 136 10*3/uL — ABNORMAL LOW (ref 150–400)
RBC: 3.19 MIL/uL — ABNORMAL LOW (ref 4.22–5.81)
RDW: 15.9 % — ABNORMAL HIGH (ref 11.5–15.5)
WBC Count: 3.9 10*3/uL — ABNORMAL LOW (ref 4.0–10.5)
nRBC: 0 % (ref 0.0–0.2)

## 2021-09-28 MED ORDER — SODIUM CHLORIDE 0.9 % IV SOLN
10.0000 mg | Freq: Once | INTRAVENOUS | Status: AC
  Administered 2021-09-28: 10 mg via INTRAVENOUS
  Filled 2021-09-28: qty 10

## 2021-09-28 MED ORDER — PALONOSETRON HCL INJECTION 0.25 MG/5ML
0.2500 mg | Freq: Once | INTRAVENOUS | Status: AC
  Administered 2021-09-28: 0.25 mg via INTRAVENOUS
  Filled 2021-09-28: qty 5

## 2021-09-28 MED ORDER — SODIUM CHLORIDE 0.9% FLUSH
10.0000 mL | INTRAVENOUS | Status: DC | PRN
  Administered 2021-09-28: 10 mL

## 2021-09-28 MED ORDER — SODIUM CHLORIDE 0.9 % IV SOLN
2.6000 mg/m2 | Freq: Once | INTRAVENOUS | Status: AC
  Administered 2021-09-28: 5 mg via INTRAVENOUS
  Filled 2021-09-28: qty 10

## 2021-09-28 MED ORDER — SODIUM CHLORIDE 0.9 % IV SOLN: Freq: Once | INTRAVENOUS | Status: AC

## 2021-09-28 MED ORDER — HEPARIN SOD (PORK) LOCK FLUSH 100 UNIT/ML IV SOLN
500.0000 [IU] | Freq: Once | INTRAVENOUS | Status: AC | PRN
  Administered 2021-09-28: 500 [IU]

## 2021-09-28 NOTE — Patient Instructions (Signed)
[facility] ONCOLOG[facility]  Discharge Instructions: Thank you for choosing [facility] to provide your oncology and hematology care.   If you have a lab appointment with the [facility], please go directly to the [facility] and check in at the registration area.   Wear comfortable clothing and clothing appropriate for easy access to any Portacath or PICC line.   We strive to give you quality time with your provider. You may need to reschedule your appointment if you arrive late (15 or more minutes).  Arriving late affects you and other patients whose appointments are after yours.  Also, if you miss three or more appointments without notifying the office, you may be dismissed from the clinic at the provider's discretion.      For prescription refill requests, have your pharmacy contact our office and allow 72 hours for refills to be completed.    Today you received the following chemotherapy and/or immunotherapy agents: Zepzelca      To help prevent nausea and vomiting after your treatment, we encourage you to take your nausea medication as directed.  BELOW ARE SYMPTOMS THAT SHOULD BE REPORTED IMMEDIATELY: *FEVER GREATER THAN 100.4 F (38 C) OR HIGHER *CHILLS OR SWEATING *NAUSEA AND VOMITING THAT IS NOT CONTROLLED WITH YOUR NAUSEA MEDICATION *UNUSUAL SHORTNESS OF BREATH *UNUSUAL BRUISING OR BLEEDING *URINARY PROBLEMS (pain or burning when urinating, or frequent urination) *BOWEL PROBLEMS (unusual diarrhea, constipation, pain near the anus) TENDERNESS IN MOUTH AND THROAT WITH OR WITHOUT PRESENCE OF ULCERS (sore throat, sores in mouth, or a toothache) UNUSUAL RASH, SWELLING OR PAIN  UNUSUAL VAGINAL DISCHARGE OR ITCHING   Items with * indicate a potential emergency and should be followed up as soon as possible or go to the Emergency Department if any problems should occur.  Please show the CHEMOTHERAPY ALERT CARD or IMMUNOTHERAPY ALERT CARD at check-in to  the Emergency Department and triage nurse.  Should you have questions after your visit or need to cancel or reschedule your appointment, please contact [facility]  Dept: [phone]  and follow the prompts.  Office hours are 8:00 a.m. to 4:30 p.m. Monday - Friday. Please note that voicemails left after 4:00 p.m. may not be returned until the following business day.  We are closed weekends and major holidays. You have access to a nurse at all times for urgent questions. Please call the main number to the clinic Dept: [phone] and follow the prompts.   For any non-urgent questions, you may also contact your provider using MyChart. We now offer e-Visits for anyone 55 and older to request care online for non-urgent symptoms. For details visit [email or web address].   Also download the MyChart app! Go to the app store, search "MyChart", open the app, select [facility], and log in with your MyChart username and password.  Masks are optional in the cancer centers. If you would like for your care team to wear a mask while they are taking care of you, please let them know. You may have one support person who is at least 64 years old accompany you for your appointments.

## 2021-09-28 NOTE — Progress Notes (Signed)
Patient's heart rate manually was 110, Per Dr. Julien Nordmann it is OK to treat with Lurbinectedin today

## 2021-09-28 NOTE — Progress Notes (Signed)
Nutrition follow-up completed with patient during infusion for metastatic lung cancer.  Weight improved and documented as 172 pounds 8 ounces September 21 increased from 169 pounds 8 ounces August 10.  Labs noted: Potassium 3.2.  Patient reports he thinks he is eating better since the last time we talked.  Reports he ate some pizza which "messed up his stomach " but he is now recovered.  He does complain of fatigue.  He has ongoing constipation but has Dulcolax at home which he plans on taking.  He is requesting an additional case of Ensure Plus high-protein.  Nutrition diagnosis: Inadequate oral intake, improving.  Intervention: Provided support and encouragement.  Recommended patient continue strategies for high-calorie, high-protein foods 3 times daily along with Ensure Plus high-protein twice daily. Provided 1 complementary case of Ensure Plus high-protein.  Monitoring, evaluation, goals: Patient will continue to increase calories and protein to minimize weight loss.  Next visit: Thursday, November 2 during infusion.  **Disclaimer: This note was dictated with voice recognition software. Similar sounding words can inadvertently be transcribed and this note may contain transcription errors which may not have been corrected upon publication of note.**

## 2021-09-28 NOTE — Progress Notes (Signed)
[facility] [phone]   [phone]  OFFICE PROGRESS NOTE  de [place], [name], MD [address] [place] [address]  DIAGNOSIS: Metastatic small cell lung cancer initially diagnosed as Limited stage (T1c, N2, M0) small cell lung cancer diagnosed in February 2022.  PRIOR THERAPY:  1) Systemic chemotherapy according to phase 3 randomized clinical trial of chemoradiation versus chemoradiation plus atezolizumab status post 3 cycles.  This treatment was discontinued secondary to intolerance. 2) palliative radiotherapy to the left hip area under the care of Dr. Tammi Klippel completed January 03, 2021. 3) Second line systemic chemotherapy with carboplatin for AUC of 5 on day 1, etoposide 100 Mg/M2 on days 1, 2 and 3 as well as Imfinzi 1500 Mg IV on day 1 and Cosela 240 Mg/M2 before chemotherapy on days 1, 2 and 3 every 3 weeks.  First dose December 13, 2020.  Status post 5 cycles.  Starting from cycle #5 the patient will be on maintenance treatment with Imfinzi every 4 weeks.  This was discontinued secondary to disease progression  CURRENT THERAPY: Third line systemic chemotherapy with Zepzelca (lurbinectedin) 3.2 Mg/M2 IV every 3 weeks.  First dose 04/19/2021.  Status post 5 cycles.  The dose of Zepzelca (lurbinectedin) was reduced to 2.6 Mg/M2 starting from cycle #3.  INTERVAL HISTORY: Adam [name] 66 y.o. male returns to the clinic today for follow-up visit.  The patient is feeling fine today with no concerning complaints except for the baseline fatigue.  He denied having any chest pain, shortness of breath, cough or hemoptysis.  He has no nausea, vomiting, diarrhea but has constipation.  He used Dulcolax yesterday but no bowel movement yet.  He denied having any fever or chills.  He lost 1 pound since his last visit.  He continues to tolerate his treatment with Zepzelca (lurbinectedin) fairly well.  He is here today for evaluation before starting cycle  #6.  MEDICAL HISTORY: Past Medical History:  Diagnosis Date   Hypertension    Lung cancer ([place])    small cell RUL    ALLERGIES:  has No Known Allergies.  MEDICATIONS:  Current Outpatient Medications  Medication Sig Dispense Refill   acetaminophen (TYLENOL) 650 MG CR tablet Take 650 mg by mouth every 8 (eight) hours as needed for pain.     bisacodyl (DULCOLAX) 5 MG EC tablet Take 5 mg by mouth daily as needed for moderate constipation.     collagenase (SANTYL) 250 UNIT/GM ointment Apply topically daily. 30 g 1   lidocaine-prilocaine (EMLA) cream Apply 1 application topically as needed. (Patient taking differently: Apply 1 application  topically as needed (to access port).) 30 g 2   mirtazapine (REMERON) 15 MG tablet Take 2 tablets (30 mg total) by mouth at bedtime. (Patient not taking: Reported on 09/22/2021) 30 tablet 2   No current facility-administered medications for this visit.    SURGICAL HISTORY:  Past Surgical History:  Procedure Laterality Date   BRONCHIAL BRUSHINGS  02/08/2020   Procedure: BRONCHIAL BRUSHINGS;  Surgeon: Freddi Starr, MD;  Location: [facility] ENDOSCOPY;  Service: Pulmonary;;   BRONCHIAL NEEDLE ASPIRATION BIOPSY  02/08/2020   Procedure: BRONCHIAL NEEDLE ASPIRATION BIOPSIES;  Surgeon: Freddi Starr, MD;  Location: [facility] ENDOSCOPY;  Service: Pulmonary;;   INCISION AND DRAINAGE ABSCESS Left 05/31/2021   Procedure: Left Hip Excision;  Surgeon: Michael Boston, MD;  Location: [facility] ORS;  Service: General;  Laterality: Left;   IR IMAGING GUIDED PORT INSERTION  12/30/2020  IR IMAGING GUIDED PORT INSERTION  07/19/2021   IR REMOVAL TUN ACCESS W/ PORT W/O FL MOD SED  05/26/2021   KNEE SURGERY Right    SHOULDER SURGERY Left    VIDEO BRONCHOSCOPY WITH ENDOBRONCHIAL ULTRASOUND N/A 02/08/2020   Procedure: VIDEO BRONCHOSCOPY WITH ENDOBRONCHIAL ULTRASOUND;  Surgeon: Freddi Starr, MD;  Location[facility] ENDOSCOPY;  Service: Pulmonary;  Laterality: N/A;    REVIEW OF SYSTEMS:  A  comprehensive review of systems was negative except for: Constitutional: positive for fatigue Gastrointestinal: positive for constipation   PHYSICAL EXAMINATION: General appearance: alert, cooperative, fatigued, and no distress Head: Normocephalic, without obvious abnormality, atraumatic Neck: no adenopathy, no JVD, supple, symmetrical, trachea midline, and thyroid not enlarged, symmetric, no tenderness/mass/nodules Lymph nodes: Cervical, supraclavicular, and axillary nodes normal. Resp: clear to auscultation bilaterally Back: symmetric, no curvature. ROM normal. No CVA tenderness. Cardio: regular rate and rhythm, S1, S2 normal, no murmur, click, rub or gallop GI: soft, non-tender; bowel sounds normal; no masses,  no organomegaly Extremities: extremities normal, atraumatic, no cyanosis or edema  ECOG PERFORMANCE STATUS: 1 - Symptomatic but completely ambulatory  Blood pressure 112/83, pulse (!) 110, temperature 97.9 F (36.6 C), temperature source Oral, resp. rate 15, weight 172 lb 8 oz (78.2 kg), SpO2 98 %.  LABORATORY DATA: Lab Results  Component Value Date   WBC 3.1 (L) 09/07/2021   HGB 9.9 (L) 09/07/2021   HCT 29.4 (L) 09/07/2021   MCV 90.7 09/07/2021   PLT 187 09/07/2021      Chemistry      Component Value Date/Time   NA 136 09/07/2021 0840   K 3.5 09/07/2021 0840   CL 102 09/07/2021 0840   CO2 29 09/07/2021 0840   BUN 12 09/07/2021 0840   CREATININE 0.81 09/07/2021 0840      Component Value Date/Time   CALCIUM 10.0 09/07/2021 0840   ALKPHOS 65 09/07/2021 0840   AST 15 09/07/2021 0840   ALT 7 09/07/2021 0840   BILITOT 0.3 09/07/2021 0840       RADIOGRAPHIC STUDIES: No results found.  ASSESSMENT AND PLAN: This is a very pleasant 66 years old African-American male recently diagnosed with limited stage small cell lung cancer in February 2022.  The patient is currently undergoing treatment according to the [place] clinical trial where the patient would be  randomized to treatment with concurrent chemoradiation with cisplatin and/or etoposide versus concurrent chemoradiation with the same regimen in addition to atezolizumab. He is status post 3 cycles of the treatment.  His treatment was discontinued secondary to intolerance and the patient has been in observation since April 2022. He had MRI of the brain performed recently that showed no evidence of metastatic disease to the brain. Repeat CT scan of the chest, abdomen and pelvis unfortunately showed evidence for disease progression involving new mass centered in the superior aspect of the right major fissure, widespread nodularity in the adjacent portion of the upper right lung, multiple new pleural-based lesions in the right hemithorax, new middle mediastinal lymphadenopathy, new hypervascular lesion in the proximal body of the pancreas and new lytic lesions in the parasymphyseal region of the left to pubic bone. His insurance declined a PET scan. The patient is started palliative systemic chemotherapy with carboplatin for AUC of 5 on day 1, etoposide 100 Mg/M2 on days 1, 2 and 3 with Cosela 4 Myeloprotection before chemotherapy and Imfinzi 1500 Mg IV every 3 weeks with the induction phase followed by maintenance Imfinzi every 4 weeks if the patient  has no evidence for disease progression.  He is status post 5 cycles.  Starting from cycle #5 he will be on treatment with Imfinzi every 4 weeks.  His treatment was discontinued after cycle #5 secondary to disease progression. The patient is currently undergoing a third line treatment with Zepzelca (lurbinectedin) 3.2 Mg/M2 every 3 weeks status post 5 cycles.  He was admitted to the hospital after cycle #2 with significant chemotherapy-induced pancytopenia and neutropenic fever as well as left hip subcutaneous abscess.  The patient has been tolerating this treatment much better recently with no concerning complaints except for mild fatigue secondary to anemia. I  recommended for him to proceed with cycle #6 today as planned. I will see him back for follow-up visit in 3 weeks for evaluation with repeat CT scan of the chest, abdomen and pelvis for restaging of his disease. For the chemotherapy-induced anemia, his hemoglobin and hematocrit are stable we will continue to monitor closely and consider him for transfusion if needed. The patient was advised to call immediately if he has any other concerning symptoms in the interval.  The patient voices understanding of current disease status and treatment options and is in agreement with the current care plan.  All questions were answered. The patient knows to call the clinic with any problems, questions or concerns. We can certainly see the patient much sooner if necessary.  The total time spent in the appointment was 20 minutes.  Disclaimer: This note was dictated with voice recognition software. Similar sounding words can inadvertently be transcribed and may not be corrected upon review.

## 2021-09-29 ENCOUNTER — Other Ambulatory Visit: Payer: Self-pay | Admitting: Physician Assistant

## 2021-10-04 ENCOUNTER — Other Ambulatory Visit: Payer: Self-pay | Admitting: Medical Oncology

## 2021-10-04 ENCOUNTER — Telehealth: Payer: Self-pay | Admitting: Medical Oncology

## 2021-10-04 DIAGNOSIS — C3411 Malignant neoplasm of upper lobe, right bronchus or lung: Secondary | ICD-10-CM

## 2021-10-04 NOTE — Progress Notes (Signed)
CT scans ordered for [facility].

## 2021-10-04 NOTE — Telephone Encounter (Signed)
CT scan orders faxed via Epic to [facility].

## 2021-10-04 NOTE — Telephone Encounter (Signed)
Send order for his scans to [facility]. New orders entered.

## 2021-10-05 ENCOUNTER — Telehealth: Payer: Self-pay | Admitting: Medical Oncology

## 2021-10-05 NOTE — Telephone Encounter (Signed)
CT orders faxed to [facility].w/ receipt confirmation.

## 2021-10-18 ENCOUNTER — Ambulatory Visit
Admission: RE | Admit: 2021-10-18 | Discharge: 2021-10-18 | Disposition: A | Discharge status: Home / Self Care | Payer: Commercial Managed Care - HMO | Source: Ambulatory Visit | Attending: Internal Medicine | Admitting: Internal Medicine

## 2021-10-18 ENCOUNTER — Other Ambulatory Visit: Payer: Self-pay | Admitting: Internal Medicine

## 2021-10-18 DIAGNOSIS — C3411 Malignant neoplasm of upper lobe, right bronchus or lung: Secondary | ICD-10-CM

## 2021-10-18 MED ORDER — HEPARIN SOD (PORK) LOCK FLUSH 100 UNIT/ML IV SOLN
500.0000 [IU] | Freq: Once | INTRAVENOUS | Status: AC
  Administered 2021-10-18: 500 [IU] via INTRAVENOUS

## 2021-10-18 MED ORDER — IOPAMIDOL (ISOVUE-300) INJECTION 61%
100.0000 mL | Freq: Once | INTRAVENOUS | Status: AC | PRN
  Administered 2021-10-18: 100 mL via INTRAVENOUS

## 2021-10-18 MED ORDER — SODIUM CHLORIDE 0.9% FLUSH
10.0000 mL | INTRAVENOUS | Status: DC | PRN
  Administered 2021-10-18: 10 mL via INTRAVENOUS

## 2021-10-18 MED FILL — Dexamethasone Sodium Phosphate Inj 100 MG/10ML: INTRAMUSCULAR | Qty: 1 | Status: AC

## 2021-10-19 ENCOUNTER — Ambulatory Visit (HOSPITAL_COMMUNITY)
Admission: RE | Admit: 2021-10-19 | Discharge: 2021-10-19 | Disposition: A | Discharge status: Home / Self Care | Payer: Commercial Managed Care - HMO | Source: Ambulatory Visit | Attending: Internal Medicine | Admitting: Internal Medicine

## 2021-10-19 ENCOUNTER — Other Ambulatory Visit: Payer: Commercial Managed Care - HMO

## 2021-10-19 ENCOUNTER — Inpatient Hospital Stay: Payer: Commercial Managed Care - HMO

## 2021-10-19 ENCOUNTER — Other Ambulatory Visit: Payer: Self-pay

## 2021-10-19 ENCOUNTER — Ambulatory Visit (HOSPITAL_COMMUNITY)
Admission: RE | Admit: 2021-10-19 | Discharge: 2021-10-19 | Disposition: A | Discharge status: Home / Self Care | Payer: Commercial Managed Care - HMO | Source: Ambulatory Visit | Attending: Student | Admitting: Student

## 2021-10-19 ENCOUNTER — Inpatient Hospital Stay (HOSPITAL_BASED_OUTPATIENT_CLINIC_OR_DEPARTMENT_OTHER): Payer: Commercial Managed Care - HMO | Admitting: Nurse Practitioner

## 2021-10-19 ENCOUNTER — Inpatient Hospital Stay: Payer: Commercial Managed Care - HMO | Attending: Hematology and Oncology | Admitting: Internal Medicine

## 2021-10-19 VITALS — BP 92/67

## 2021-10-19 DIAGNOSIS — Z515 Encounter for palliative care: Secondary | ICD-10-CM | POA: Diagnosis not present

## 2021-10-19 DIAGNOSIS — R531 Weakness: Secondary | ICD-10-CM

## 2021-10-19 DIAGNOSIS — N200 Calculus of kidney: Secondary | ICD-10-CM | POA: Insufficient documentation

## 2021-10-19 DIAGNOSIS — R530 Neoplastic (malignant) related fatigue: Secondary | ICD-10-CM

## 2021-10-19 DIAGNOSIS — C3491 Malignant neoplasm of unspecified part of right bronchus or lung: Secondary | ICD-10-CM

## 2021-10-19 DIAGNOSIS — Z87891 Personal history of nicotine dependence: Secondary | ICD-10-CM | POA: Insufficient documentation

## 2021-10-19 DIAGNOSIS — I700 Atherosclerosis of aorta: Secondary | ICD-10-CM | POA: Insufficient documentation

## 2021-10-19 DIAGNOSIS — J90 Pleural effusion, not elsewhere classified: Secondary | ICD-10-CM | POA: Insufficient documentation

## 2021-10-19 DIAGNOSIS — C3411 Malignant neoplasm of upper lobe, right bronchus or lung: Secondary | ICD-10-CM

## 2021-10-19 DIAGNOSIS — K4090 Unilateral inguinal hernia, without obstruction or gangrene, not specified as recurrent: Secondary | ICD-10-CM | POA: Insufficient documentation

## 2021-10-19 DIAGNOSIS — Z79899 Other long term (current) drug therapy: Secondary | ICD-10-CM | POA: Insufficient documentation

## 2021-10-19 DIAGNOSIS — Z5111 Encounter for antineoplastic chemotherapy: Secondary | ICD-10-CM | POA: Insufficient documentation

## 2021-10-19 DIAGNOSIS — Z7189 Other specified counseling: Secondary | ICD-10-CM

## 2021-10-19 DIAGNOSIS — I2510 Atherosclerotic heart disease of native coronary artery without angina pectoris: Secondary | ICD-10-CM | POA: Insufficient documentation

## 2021-10-19 DIAGNOSIS — Z95828 Presence of other vascular implants and grafts: Secondary | ICD-10-CM

## 2021-10-19 DIAGNOSIS — Z923 Personal history of irradiation: Secondary | ICD-10-CM | POA: Insufficient documentation

## 2021-10-19 DIAGNOSIS — I10 Essential (primary) hypertension: Secondary | ICD-10-CM | POA: Insufficient documentation

## 2021-10-19 DIAGNOSIS — C7951 Secondary malignant neoplasm of bone: Secondary | ICD-10-CM | POA: Insufficient documentation

## 2021-10-19 LAB — CMP (CANCER CENTER ONLY)
ALT: 15 U/L (ref 0–44)
AST: 19 U/L (ref 15–41)
Albumin: 3.5 g/dL (ref 3.5–5.0)
Alkaline Phosphatase: 62 U/L (ref 38–126)
Anion gap: 9 (ref 5–15)
BUN: 14 mg/dL (ref 8–23)
CO2: 28 mmol/L (ref 22–32)
Calcium: 9.5 mg/dL (ref 8.9–10.3)
Chloride: 102 mmol/L (ref 98–111)
Creatinine: 0.9 mg/dL (ref 0.61–1.24)
GFR, Estimated: >60 mL/min (ref >60)
Glucose, Bld: 139 mg/dL — ABNORMAL HIGH (ref 70–99)
Potassium: 3.2 mmol/L — ABNORMAL LOW (ref 3.5–5.1)
Sodium: 139 mmol/L (ref 135–145)
Total Bilirubin: 0.5 mg/dL (ref 0.3–1.2)
Total Protein: 7.2 g/dL (ref 6.5–8.1)

## 2021-10-19 LAB — CBC WITH DIFFERENTIAL (CANCER CENTER ONLY)
Abs Immature Granulocytes: 0.02 10*3/uL (ref 0.00–0.07)
Basophils Absolute: 0 10*3/uL (ref 0.0–0.1)
Basophils Relative: 0 %
Eosinophils Absolute: 0 10*3/uL (ref 0.0–0.5)
Eosinophils Relative: 0 %
HCT: 26.3 % — ABNORMAL LOW (ref 39.0–52.0)
Hemoglobin: 8.6 g/dL — ABNORMAL LOW (ref 13.0–17.0)
Immature Granulocytes: 0 %
Lymphocytes Relative: 12 %
Lymphs Abs: 0.6 10*3/uL — ABNORMAL LOW (ref 0.7–4.0)
MCH: 29.9 pg (ref 26.0–34.0)
MCHC: 32.7 g/dL (ref 30.0–36.0)
MCV: 91.3 fL (ref 80.0–100.0)
Monocytes Absolute: 0.7 10*3/uL (ref 0.1–1.0)
Monocytes Relative: 16 %
Neutro Abs: 3.2 10*3/uL (ref 1.7–7.7)
Neutrophils Relative %: 72 %
Platelet Count: 201 10*3/uL (ref 150–400)
RBC: 2.88 MIL/uL — ABNORMAL LOW (ref 4.22–5.81)
RDW: 18.9 % — ABNORMAL HIGH (ref 11.5–15.5)
WBC Count: 4.6 10*3/uL (ref 4.0–10.5)
nRBC: 0.9 % — ABNORMAL HIGH (ref 0.0–0.2)

## 2021-10-19 MED ORDER — SODIUM CHLORIDE 0.9% FLUSH
10.0000 mL | Freq: Once | INTRAVENOUS | Status: AC
  Administered 2021-10-19: 10 mL

## 2021-10-19 MED ORDER — LIDOCAINE HCL 1 % IJ SOLN
INTRAMUSCULAR | Status: AC
  Administered 2021-10-19: 15 mL
  Filled 2021-10-19: qty 20

## 2021-10-19 MED ORDER — POTASSIUM CHLORIDE CRYS ER 20 MEQ PO TBCR: 20.0000 meq | EXTENDED_RELEASE_TABLET | Freq: Every day | ORAL | 0 refills | Status: DC

## 2021-10-19 NOTE — Procedures (Signed)
PROCEDURE SUMMARY:  Successful US guided right thoracentesis. Yielded 1 L of blood-tinged fluid. Pt tolerated procedure well. No immediate complications.  Specimen not sent for labs. CXR ordered; no post-procedure pneumothorax  EBL < 2 mL  Theresa Duty, NP 10/19/2021 3:23 PM

## 2021-10-19 NOTE — Progress Notes (Signed)
[facility] Telephone:(336) [phone]   Fax:(336) [phone]  OFFICE PROGRESS NOTE  de [place], [name], MD [address] [place] [address]  DIAGNOSIS: Metastatic small cell lung cancer initially diagnosed as Limited stage (T1c, N2, M0) small cell lung cancer diagnosed in February 2022.  PRIOR THERAPY:  1) Systemic chemotherapy according to phase 3 randomized clinical trial of chemoradiation versus chemoradiation plus atezolizumab status post 3 cycles.  This treatment was discontinued secondary to intolerance. 2) palliative radiotherapy to the left hip area under the care of Dr. Tammi Klippel completed January 03, 2021. 3) Second line systemic chemotherapy with carboplatin for AUC of 5 on day 1, etoposide 100 Mg/M2 on days 1, 2 and 3 as well as Imfinzi 1500 Mg IV on day 1 and Cosela 240 Mg/M2 before chemotherapy on days 1, 2 and 3 every 3 weeks.  First dose December 13, 2020.  Status post 5 cycles.  Starting from cycle #5 the patient will be on maintenance treatment with Imfinzi every 4 weeks.  This was discontinued secondary to disease progression  CURRENT THERAPY: Third line systemic chemotherapy with Zepzelca (lurbinectedin) 3.2 Mg/M2 IV every 3 weeks.  First dose 04/19/2021.  Status post 6 cycles.  The dose of Zepzelca (lurbinectedin) was reduced to 2.6 Mg/M2 starting from cycle #3.  INTERVAL HISTORY: Adam [name] 66 y.o. male returns to the clinic today for follow-up visit.  The patient is complaining of increasing fatigue and weakness as well as shortness of breath with minimal exertion.  He also has some right neck soreness and he think it may be related to the position he sleeps he denied having any current chest pain but has mild cough with no hemoptysis.  He has no nausea, vomiting, diarrhea or constipation.  He has no headache or visual changes.  He has no recent weight loss or night sweats.  He has been tolerating his treatment with Zepzelca (lurbinectedin) fairly  well.  The patient is here today for evaluation and repeat CT scan of the chest, abdomen and pelvis for restaging of his disease.  MEDICAL HISTORY: Past Medical History:  Diagnosis Date   Hypertension    Lung cancer ([place])    small cell RUL    ALLERGIES:  has No Known Allergies.  MEDICATIONS:  Current Outpatient Medications  Medication Sig Dispense Refill   acetaminophen (TYLENOL) 650 MG CR tablet Take 650 mg by mouth every 8 (eight) hours as needed for pain.     bisacodyl (DULCOLAX) 5 MG EC tablet Take 5 mg by mouth daily as needed for moderate constipation.     lidocaine-prilocaine (EMLA) cream Apply 1 application topically as needed. (Patient taking differently: Apply 1 application  topically as needed (to access port).) 30 g 2   mirtazapine (REMERON) 15 MG tablet Take 2 tablets (30 mg total) by mouth at bedtime. 30 tablet 2   No current facility-administered medications for this visit.    SURGICAL HISTORY:  Past Surgical History:  Procedure Laterality Date   BRONCHIAL BRUSHINGS  02/08/2020   Procedure: BRONCHIAL BRUSHINGS;  Surgeon: Freddi Starr, MD;  Location: [facility] ENDOSCOPY;  Service: Pulmonary;;   BRONCHIAL NEEDLE ASPIRATION BIOPSY  02/08/2020   Procedure: BRONCHIAL NEEDLE ASPIRATION BIOPSIES;  Surgeon: Freddi Starr, MD;  Location: [facility] ENDOSCOPY;  Service: Pulmonary;;   INCISION AND DRAINAGE ABSCESS Left 05/31/2021   Procedure: Left Hip Excision;  Surgeon: Michael Boston, MD;  Location: [facility] ORS;  Service: General;  Laterality: Left;  IR IMAGING GUIDED PORT INSERTION  12/30/2020   IR IMAGING GUIDED PORT INSERTION  07/19/2021   IR REMOVAL TUN ACCESS W/ PORT W/O FL MOD SED  05/26/2021   KNEE SURGERY Right    SHOULDER SURGERY Left    VIDEO BRONCHOSCOPY WITH ENDOBRONCHIAL ULTRASOUND N/A 02/08/2020   Procedure: VIDEO BRONCHOSCOPY WITH ENDOBRONCHIAL ULTRASOUND;  Surgeon: Freddi Starr, MD;  Location: [facility];  Service: Pulmonary;  Laterality: N/A;    REVIEW OF  SYSTEMS:  Constitutional: positive for fatigue Eyes: negative Ears, nose, mouth, throat, and face: negative Respiratory: positive for cough and dyspnea on exertion Cardiovascular: negative Gastrointestinal: negative Genitourinary:negative Integument/breast: negative Hematologic/lymphatic: negative Musculoskeletal:positive for neck pain Neurological: negative Behavioral/Psych: negative Endocrine: negative Allergic/Immunologic: negative   PHYSICAL EXAMINATION: General appearance: alert, cooperative, fatigued, and no distress Head: Normocephalic, without obvious abnormality, atraumatic Neck: no adenopathy, no JVD, supple, symmetrical, trachea midline, and thyroid not enlarged, symmetric, no tenderness/mass/nodules Lymph nodes: Cervical, supraclavicular, and axillary nodes normal. Resp: diminished breath sounds RLL, RML, and RUL and dullness to percussion RLL, RML, and RUL Back: symmetric, no curvature. ROM normal. No CVA tenderness. Cardio: regular rate and rhythm, S1, S2 normal, no murmur, click, rub or gallop GI: soft, non-tender; bowel sounds normal; no masses,  no organomegaly Extremities: extremities normal, atraumatic, no cyanosis or edema Neurologic: Alert and oriented X 3, normal strength and tone. Normal symmetric reflexes. Normal coordination and gait  ECOG PERFORMANCE STATUS: 1 - Symptomatic but completely ambulatory  Blood pressure 110/75, pulse (!) 113, temperature 97.7 F (36.5 C), temperature source Oral, resp. rate 16, weight 173 lb 9 oz (78.7 kg), SpO2 93 %.  LABORATORY DATA: Lab Results  Component Value Date   WBC 4.6 10/19/2021   HGB 8.6 (L) 10/19/2021   HCT 26.3 (L) 10/19/2021   MCV 91.3 10/19/2021   PLT 201 10/19/2021      Chemistry      Component Value Date/Time   NA 136 09/28/2021 0939   K 3.2 (L) 09/28/2021 0939   CL 102 09/28/2021 0939   CO2 28 09/28/2021 0939   BUN 8 09/28/2021 0939   CREATININE 0.85 09/28/2021 0939      Component Value  Date/Time   CALCIUM 9.4 09/28/2021 0939   ALKPHOS 66 09/28/2021 0939   AST 14 (L) 09/28/2021 0939   ALT 6 09/28/2021 0939   BILITOT 0.3 09/28/2021 0939       RADIOGRAPHIC STUDIES: No results found.  ASSESSMENT AND PLAN: This is a very pleasant 66 years old African-American male recently diagnosed with limited stage small cell lung cancer in February 2022.  The patient is currently undergoing treatment according to the [place] clinical trial where the patient would be randomized to treatment with concurrent chemoradiation with cisplatin and/or etoposide versus concurrent chemoradiation with the same regimen in addition to atezolizumab. He is status post 3 cycles of the treatment.  His treatment was discontinued secondary to intolerance and the patient has been in observation since April 2022. He had MRI of the brain performed recently that showed no evidence of metastatic disease to the brain. Repeat CT scan of the chest, abdomen and pelvis unfortunately showed evidence for disease progression involving new mass centered in the superior aspect of the right major fissure, widespread nodularity in the adjacent portion of the upper right lung, multiple new pleural-based lesions in the right hemithorax, new middle mediastinal lymphadenopathy, new hypervascular lesion in the proximal body of the pancreas and new lytic lesions in the parasymphyseal region of the  left to pubic bone. His insurance declined a PET scan. The patient is started palliative systemic chemotherapy with carboplatin for AUC of 5 on day 1, etoposide 100 Mg/M2 on days 1, 2 and 3 with Cosela 4 Myeloprotection before chemotherapy and Imfinzi 1500 Mg IV every 3 weeks with the induction phase followed by maintenance Imfinzi every 4 weeks if the patient has no evidence for disease progression.  He is status post 5 cycles.  Starting from cycle #5 he will be on treatment with Imfinzi every 4 weeks.  His treatment was discontinued after  cycle #5 secondary to disease progression. The patient is currently undergoing a third line treatment with Zepzelca (lurbinectedin) 3.2 Mg/M2 every 3 weeks status post 6 cycles.  He was admitted to the hospital after cycle #2 with significant chemotherapy-induced pancytopenia and neutropenic fever as well as left hip subcutaneous abscess.  The patient has been tolerating his treatment with Zepzelca (lurbinectedin) fairly well with no concerning adverse effect except for fatigue. He had repeat CT scan of the chest, abdomen and pelvis performed yesterday.  Unfortunately the report of the scan is still pending but I personally and independently reviewed the scan images and discussed it with the patient today.  His scan showed large right pleural effusion. I recommended for the patient to proceed with ultrasound-guided right thoracentesis for drainage of the large pleural effusion hopefully tomorrow. I will see him back for follow-up visit in 1 week for discussion of the scan results and proceeding with cycle #7 if he has no evidence for disease progression on the scan. For the chemotherapy-induced anemia, we will continue to monitor his hemoglobin and hematocrit closely and consider The patient for transfusion if his hemoglobin is less than 8.0G/DL. The patient was advised to call immediately if he has any other concerning symptoms in the interval. The patient voices understanding of current disease status and treatment options and is in agreement with the current care plan.  All questions were answered. The patient knows to call the clinic with any problems, questions or concerns. We can certainly see the patient much sooner if necessary.  The total time spent in the appointment was 30 minutes.  Disclaimer: This note was dictated with voice recognition software. Similar sounding words can inadvertently be transcribed and may not be corrected upon review.

## 2021-10-20 ENCOUNTER — Encounter: Payer: Self-pay | Admitting: Nurse Practitioner

## 2021-10-20 ENCOUNTER — Encounter: Payer: Self-pay | Admitting: Internal Medicine

## 2021-10-20 NOTE — Progress Notes (Signed)
[facility]  Telephone:([phone] Fax:(336) [phone]   Name: Adam [name] Date: 10/20/2021 MRN: [id number]  DOB: [birth year]  Patient Care Team: de [place], Blondell Reveal, MD as PCP - General (Family Medicine) [name], RN (Inactive) as Registered Nurse Curt Bears, MD as Consulting Physician (Oncology)    INTERVAL HISTORY: Adam [name] is a 66 y.o. 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  Palliative ask to see for symptom management and goals of care.   SOCIAL HISTORY:     reports that he quit smoking about 3 years ago. His smoking use included cigarettes. He has a 20.00 pack-year smoking history. He has never used smokeless tobacco. He reports that he does not currently use alcohol. He reports that he does not use drugs.  ADVANCE DIRECTIVES:  Patient does not have a documented advanced directive.  Is interested in completing.  Education provided, advanced directive packet and clinic dates made available.  CODE STATUS: DNR  PAST MEDICAL HISTORY: Past Medical History:  Diagnosis Date   Hypertension    Lung cancer ([place])    small cell RUL    ALLERGIES:  has No Known Allergies.  MEDICATIONS:  Current Outpatient Medications  Medication Sig Dispense Refill   acetaminophen (TYLENOL) 650 MG CR tablet Take 650 mg by mouth every 8 (eight) hours as needed for pain.     bisacodyl (DULCOLAX) 5 MG EC tablet Take 5 mg by mouth daily as needed for moderate constipation.     lidocaine-prilocaine (EMLA) cream Apply 1 application topically as needed. (Patient taking differently: Apply 1 application  topically as needed (to access port).) 30 g 2   mirtazapine (REMERON) 15 MG tablet Take 2 tablets (30 mg total) by mouth at bedtime. 30 tablet 2   potassium chloride SA (KLOR-CON M) 20 MEQ tablet Take 1 tablet (20  mEq total) by mouth daily. 7 tablet 0   No current facility-administered medications for this visit.    VITAL SIGNS: There were no vitals taken for this visit. There were no vitals filed for this visit.  Estimated body mass index is 25.63 kg/m as calculated from the following:   Height as of 09/22/21: 5\' 9"  (1.753 m).   Weight as of an earlier encounter on 10/19/21: 173 lb 9 oz (78.7 kg).   PERFORMANCE STATUS (ECOG) : 0 - Asymptomatic   Physical Exam General: NAD, ambulatory with cane  Pulmonary: normal breathing pattern  Extremities: no edema, no joint deformities Skin: no rashes Neurological: AAO x3, mood appropriate   IMPRESSION: Adam [name] presented to the clinic today for ongoing follow-up. He is complain of increased weakness and fatigue.  Does endorse occasional shortness of breath with exertion.  Denies nausea, vomiting, diarrhea or constipation.  Is rubbing the back of his neck which he thinks he may have slept wrong.  Denies any other pain or discomfort.  His treatment is being held today after send Dr. Earlie Server.  He is going to be scheduled for thoracentesis due to recent CT scan showing large right pleural effusion.  No symptom management needs at this time.  Appetite continues to be well.  Is trying to drink 1-2 ensures daily.  Weight is stable at 173.9 pounds.  He understands we are available for additional support as needed.  Verbalizes appreciation. Port-A-Cath de accessed prior to discharging home.   PLAN: Ongoing  support.  No symptom management needs at this time. I will plan to see patient back in 4-6 weeks.  He knows to contact our office sooner if needed.   Patient expressed understanding and was in agreement with this plan. He also understands that He can call the clinic at any time with any questions, concerns, or complaints.    Any controlled substances utilized were prescribed in the context of palliative care. PDMP has been reviewed.     Time Total:  35 min   Visit consisted of counseling and education dealing with the complex and emotionally intense issues of symptom management and palliative care in the setting of serious and potentially life-threatening illness.Greater than 50%  of this time was spent counseling and coordinating care related to the above assessment and plan.  Alda Lea, AGPCNP-BC  Palliative Medicine Team/[facility]

## 2021-10-24 ENCOUNTER — Other Ambulatory Visit: Payer: Self-pay | Admitting: Physician Assistant

## 2021-10-24 ENCOUNTER — Ambulatory Visit: Payer: Commercial Managed Care - HMO | Admitting: Physician Assistant

## 2021-10-24 ENCOUNTER — Ambulatory Visit (HOSPITAL_COMMUNITY)
Admission: RE | Admit: 2021-10-24 | Discharge: 2021-10-24 | Disposition: A | Discharge status: Home / Self Care | Payer: Commercial Managed Care - HMO | Source: Ambulatory Visit | Attending: Physician Assistant | Admitting: Physician Assistant

## 2021-10-24 ENCOUNTER — Telehealth: Payer: Self-pay | Admitting: Medical Oncology

## 2021-10-24 VITALS — BP 84/52

## 2021-10-24 DIAGNOSIS — Z9889 Other specified postprocedural states: Secondary | ICD-10-CM

## 2021-10-24 DIAGNOSIS — C3490 Malignant neoplasm of unspecified part of unspecified bronchus or lung: Secondary | ICD-10-CM | POA: Diagnosis not present

## 2021-10-24 DIAGNOSIS — J90 Pleural effusion, not elsewhere classified: Secondary | ICD-10-CM

## 2021-10-24 MED ORDER — LIDOCAINE HCL 1 % IJ SOLN
INTRAMUSCULAR | Status: AC
  Administered 2021-10-24: 10 mL
  Filled 2021-10-24: qty 20

## 2021-10-24 NOTE — Procedures (Signed)
PROCEDURE SUMMARY:  Successful US guided right thoracentesis. Yielded 2.2 L of thin red fluid. Patient tolerated procedure well. No immediate complications. EBL = trace  Post procedure chest X-ray reveals possible small ex-vacuo type pneumothorax but patient is asymptomatic.  He may need thoracentesis twice weekly, or consider a tunneled Pleural catheter.  Judie Grieve Shelsey Rieth PA-C 10/24/2021 3:39 PM

## 2021-10-24 NOTE — Telephone Encounter (Signed)
Adam [name] notified of pt SOB.Recommended [facility].

## 2021-10-24 NOTE — Telephone Encounter (Signed)
SOB . Had R thoracentesis last week. He was told after the procedure that his cxray demonstrates a persistent  amount of fluid remaining.   CXR ordered then [facility] appt. Pt confirmed appts.

## 2021-10-25 ENCOUNTER — Telehealth: Payer: Self-pay | Admitting: Internal Medicine

## 2021-10-25 MED FILL — Dexamethasone Sodium Phosphate Inj 100 MG/10ML: INTRAMUSCULAR | Qty: 1 | Status: AC

## 2021-10-25 NOTE — Progress Notes (Unsigned)
[facility] OFFICE PROGRESS NOTE  de [place], [name], MD [address] [place] [address]  DIAGNOSIS: Metastatic small cell lung cancer initially diagnosed as Limited stage (T1c, N2, M0) small cell lung cancer diagnosed in February 2022.  PRIOR THERAPY: 1) Systemic chemotherapy according to phase 3 randomized clinical trial of chemoradiation versus chemoradiation plus atezolizumab status post 3 cycles.  This treatment was discontinued secondary to intolerance. 2) palliative radiotherapy to the left hip area under the care of Dr. Tammi Klippel completed January 03, 2021. 3) Second line systemic chemotherapy with carboplatin for AUC of 5 on day 1, etoposide 100 Mg/M2 on days 1, 2 and 3 as well as Imfinzi 1500 Mg IV on day 1 and Cosela 240 Mg/M2 before chemotherapy on days 1, 2 and 3 every 3 weeks.  First dose December 13, 2020.  Status post 5 cycles.  Starting from cycle #5 the patient will be on maintenance treatment with Imfinzi every 4 weeks.  This was discontinued secondary to disease progression 4)Third line systemic chemotherapy with Zepzelca (lurbinectedin) 3.2 Mg/M2 IV every 3 weeks.  First dose 04/19/2021.  Status post 6 cycles.  The dose of Zepzelca (lurbinectedin) was reduced to 2.6 Mg/M2 starting from cycle #3  CURRENT THERAPY: Palliative systemic chemotherapy with Irinotecan on days 1 and 8 IV every 3 weeks.  First dose expected on 11/01/2021.  INTERVAL HISTORY: Adam [name] 66 y.o. male returns to the clinic today for a follow-up visit.  The patient was last seen by Dr. Julien Nordmann on 10/19/21.  The patient had a restaging CT scan at that time and the results were pending.  However, the patient had increased fatigue, weakness, and shortness of breath with minimal exertion.  He also was found to have a very large right pleural effusion.  The patient underwent thoracentesis.  Given his postthoracentesis chest x-rays continued to show a large effusion, he underwent a second  thoracentesis on 10/24/2021 which had yielded 2.2 L of fluid.  Since undergoing his thoracentesis, his breathing is continued to be "winded". He noticed some improvement after his procedures, but notes it has gradually worsened. However, he does not feel he needs/wants another thoracentesis at this time. His oxygen is 95% on room air.   Since last being seen last week the patient reports "feeling pretty good."  The patient denies any fever, chills, or night sweats.  He reports noticing he is losing some weight. He reports he does drink shakes at night to aid with weight but has been out for a few days. He reports his appetite is not as good recently. He denies any cough or hemoptysis. He denies chest pain.  He denies any nausea, vomiting, diarrhea, or constipation.  He reports a pain in his right flank/abdomen a few days ago that coincided with dark stool, but not black stool. He reports theses subsided a few days ago and are no issue currently.   The patient is here to review the final results of his CT scan and for more detailed discussion about his current condition and recommended treatment options.    MEDICAL HISTORY: Past Medical History:  Diagnosis Date   Hypertension    Lung cancer ([place])    small cell RUL    ALLERGIES:  has No Known Allergies.  MEDICATIONS:  Current Outpatient Medications  Medication Sig Dispense Refill   acetaminophen (TYLENOL) 650 MG CR tablet Take 650 mg by mouth every 8 (eight) hours as needed for pain.     bisacodyl (  DULCOLAX) 5 MG EC tablet Take 5 mg by mouth daily as needed for moderate constipation.     lidocaine-prilocaine (EMLA) cream Apply 1 application topically as needed. (Patient taking differently: Apply 1 application  topically as needed (to access port).) 30 g 2   mirtazapine (REMERON) 15 MG tablet Take 2 tablets (30 mg total) by mouth at bedtime. 30 tablet 2   potassium chloride SA (KLOR-CON M) 20 MEQ tablet Take 1 tablet (20 mEq total) by mouth  daily. 7 tablet 0   No current facility-administered medications for this visit.    SURGICAL HISTORY:  Past Surgical History:  Procedure Laterality Date   BRONCHIAL BRUSHINGS  02/08/2020   Procedure: BRONCHIAL BRUSHINGS;  Surgeon: Freddi Starr, MD;  Location: [facility];  Service: Pulmonary;;   BRONCHIAL NEEDLE ASPIRATION BIOPSY  02/08/2020   Procedure: BRONCHIAL NEEDLE ASPIRATION BIOPSIES;  Surgeon: Freddi Starr, MD;  Location: [facility];  Service: Pulmonary;;   INCISION AND DRAINAGE ABSCESS Left 05/31/2021   Procedure: Left Hip Excision;  Surgeon: Michael Boston, MD;  Location: [facility];  Service: General;  Laterality: Left;   IR IMAGING GUIDED PORT INSERTION  12/30/2020   IR IMAGING GUIDED PORT INSERTION  07/19/2021   IR REMOVAL TUN ACCESS W/ PORT W/O FL MOD SED  05/26/2021   KNEE SURGERY Right    SHOULDER SURGERY Left    VIDEO BRONCHOSCOPY WITH ENDOBRONCHIAL ULTRASOUND N/A 02/08/2020   Procedure: VIDEO BRONCHOSCOPY WITH ENDOBRONCHIAL ULTRASOUND;  Surgeon: Freddi Starr, MD;  Location: [name];  Service: Pulmonary;  Laterality: N/A;    REVIEW OF SYSTEMS:   Review of Systems  Constitutional: Positive for fatigue and weight loss. Negative for appetite change, chills, and fever.  HENT: Negative for mouth sores, nosebleeds, sore throat and trouble swallowing.   Eyes: Negative for eye problems and icterus.  Respiratory: Positive for dyspnea. Negative for cough, hemoptysis, and wheezing.   Cardiovascular: Negative for chest pain and leg swelling.  Gastrointestinal: One episode of right flank pain/reported dark stools. Negative for abdominal pain, constipation, diarrhea, nausea and vomiting.  Genitourinary: Negative for bladder incontinence, difficulty urinating, dysuria, frequency and hematuria.   Musculoskeletal: Negative for back pain, gait problem, neck pain and neck stiffness.  Skin: Negative for itching and rash.  Neurological: Negative for dizziness, extremity  weakness, gait problem, headaches, light-headedness and seizures.  Hematological: Negative for adenopathy. Does not bruise/bleed easily.  Psychiatric/Behavioral: Negative for confusion, depression and sleep disturbance. The patient is not nervous/anxious.     PHYSICAL EXAMINATION:  There were no vitals taken for this visit.  ECOG PERFORMANCE STATUS: 2-3  Physical Exam  Constitutional: Oriented to person, place, and time and chronically ill appearing male/cachetic appearing, and in no distress.  HENT:  Head: Normocephalic and atraumatic.  Mouth/Throat: Oropharynx is clear and moist. No oropharyngeal exudate.  Eyes: Conjunctivae are normal. Right eye exhibits no discharge. Left eye exhibits no discharge. No scleral icterus.  Neck: Normal range of motion. Neck supple.  Cardiovascular: Increased rate, regular rhythm, normal heart sounds and intact distal pulses.   Pulmonary/Chest: Right sided rales with decreased breath sounds in the RLL. No respiratory distress. No wheezes.  Abdominal: Soft. Exhibits no distension. There is no tenderness.  Musculoskeletal: In wheelchair. Exhibits no edema.  Lymphadenopathy:    No cervical adenopathy.  Neurological: Alert and oriented to person, place, and time. Exhibits normal muscle tone. In wheelchair.  Skin: Skin is warm and dry. No rash noted. Not diaphoretic. No erythema. No pallor.  Psychiatric: Mood,  memory and judgment normal.  Vitals reviewed.  LABORATORY DATA: Lab Results  Component Value Date   WBC 4.6 10/19/2021   HGB 8.6 (L) 10/19/2021   HCT 26.3 (L) 10/19/2021   MCV 91.3 10/19/2021   PLT 201 10/19/2021      Chemistry      Component Value Date/Time   NA 139 10/19/2021 1020   K 3.2 (L) 10/19/2021 1020   CL 102 10/19/2021 1020   CO2 28 10/19/2021 1020   BUN 14 10/19/2021 1020   CREATININE 0.90 10/19/2021 1020      Component Value Date/Time   CALCIUM 9.5 10/19/2021 1020   ALKPHOS 62 10/19/2021 1020   AST 19 10/19/2021 1020    ALT 15 10/19/2021 1020   BILITOT 0.5 10/19/2021 1020       RADIOGRAPHIC STUDIES:  US Thoracentesis Asp Pleural space w/IMG guide  Result Date: 10/24/2021 INDICATION: Lung cancer with large right pleural effusion. Request therapeutic thoracentesis EXAM: ULTRASOUND GUIDED RIGHT THORACENTESIS MEDICATIONS: 1% lidocaine 10 mL COMPLICATIONS: None immediate. PROCEDURE: An ultrasound guided thoracentesis was thoroughly discussed with the patient and questions answered. The benefits, risks, alternatives and complications were also discussed. The patient understands and wishes to proceed with the procedure. Written consent was obtained. Ultrasound was performed to localize and mark an adequate pocket of fluid in the right chest. The area was then prepped and draped in the normal sterile fashion. 1% Lidocaine was used for local anesthesia. Under ultrasound guidance a 6 Fr Safe-T-Centesis catheter was introduced. Thoracentesis was performed. The catheter was removed and a dressing applied. FINDINGS: A total of approximately 2.2 L of thin red fluid was removed. IMPRESSION: Successful ultrasound guided right thoracentesis yielding 2.2 L of pleural fluid. Possible small ex vacuo type pneumothorax seen on post-procedure chest x-ray. Patient is asymptomatic. Procedure performed by: Gareth Eagle, PA-C Electronically Signed   By: Aletta Edouard M.D.   On: 10/24/2021 16:16   DG Chest 1 View  Result Date: 10/24/2021 CLINICAL DATA:  Post thoracentesis with 2.2 L removed from the RIGHT chest. EXAM: CHEST  1 VIEW COMPARISON:  October 18, 2021 FINDINGS: RIGHT-sided Port-A-Cath terminates in the mid SVC. Cardiomediastinal contours and hilar structures grossly stable and largely obscured in the RIGHT chest due to persistent large RIGHT-sided pleural effusion which appears to have diminished following thoracentesis. Potential small pneumothorax over the RIGHT upper chest mixed with pleural fluid and pleural disease. LEFT chest  is clear. On limited assessment no acute skeletal process. IMPRESSION: 1. Persistent large RIGHT-sided pleural effusion which appears to have diminished following thoracentesis. Potential small pneumothorax over the RIGHT upper chest mixed with pleural fluid and pleural disease. Could consider further evaluation with expiratory view as warranted. If present this likely represents a small ex vacuo type pneumothorax. 2. LEFT chest is clear. These results were called by telephone at the time of interpretation on 10/24/2021 at 3:28 pm to provider [facility] , who verbally acknowledged these results. Electronically Signed   By: Zetta Bills M.D.   On: 10/24/2021 15:28   US Thoracentesis Asp Pleural space w/IMG guide  Result Date: 10/19/2021 INDICATION: Patient with a history of metastatic small cell lung cancer presents today with large right pleural effusion. Interventional radiology asked to perform a therapeutic thoracentesis. EXAM: ULTRASOUND GUIDED THORACENTESIS MEDICATIONS: 1% lidocaine 10 mL COMPLICATIONS: None immediate. PROCEDURE: An ultrasound guided thoracentesis was thoroughly discussed with the patient and questions answered. The benefits, risks, alternatives and complications were also discussed. The patient understands and wishes to proceed  with the procedure. Written consent was obtained. Ultrasound was performed to localize and mark an adequate pocket of fluid in the right chest. The area was then prepped and draped in the normal sterile fashion. 1% Lidocaine was used for local anesthesia. Under ultrasound guidance a 6 Fr Safe-T-Centesis catheter was introduced. Thoracentesis was performed. The catheter was removed and a dressing applied. FINDINGS: A total of approximately 1 L of blood-tinged fluid was removed. IMPRESSION: Successful ultrasound guided right thoracentesis yielding 1 L of pleural fluid. Read by: Soyla Dryer, NP Electronically Signed   By: Corrie Mckusick D.O.   On: 10/19/2021  15:38   DG Chest 1 View  Result Date: 10/19/2021 CLINICAL DATA:  Status post thoracentesis. EXAM: CHEST  1 VIEW COMPARISON:  Chest radiograph 05/17/2021, CT chest 1 day prior. FINDINGS: The right chest wall port is stable. The cardiomediastinal silhouette is not well assessed. There is near-complete opacification of the right hemithorax with a small amount of aerated lung in the right apex likely reflecting a persistent large right pleural effusion. There is apparent persistent leftward displacement of the heart. There is no appreciable pneumothorax The left hemithorax is clear. There is no left pleural effusion or pneumothorax There is no acute osseous abnormality. IMPRESSION: Persistent large right pleural effusion with near-complete opacification of the right hemithorax and a small amount of aerated lung in the right apex, and persistent leftward displacement of the heart. No appreciable pneumothorax. Electronically Signed   By: Valetta Mole M.D.   On: 10/19/2021 15:18   CT CHEST ABDOMEN PELVIS W CONTRAST  Result Date: 10/19/2021 CLINICAL DATA:  Small-cell lung cancer, assess treatment response * Tracking Code: BO * EXAM: CT CHEST, ABDOMEN, AND PELVIS WITH CONTRAST TECHNIQUE: Multidetector CT imaging of the chest, abdomen and pelvis was performed following the standard protocol during bolus administration of intravenous contrast. RADIATION DOSE REDUCTION: This exam was performed according to the departmental dose-optimization program which includes automated exposure control, adjustment of the mA and/or kV according to patient size and/or use of iterative reconstruction technique. CONTRAST:  11mL ISOVUE-300 IOPAMIDOL (ISOVUE-300) INJECTION 61% additional oral enteric contrast COMPARISON:  07/26/2021 FINDINGS: CT CHEST FINDINGS Cardiovascular: Right chest port catheter. Aortic atherosclerosis. Normal heart size. Three-vessel coronary artery calcifications. No pericardial effusion. Relatively expansile  appearance of the right brachial and axillary veins, with soft tissue stranding in the right axilla (series 2, image 8). Mediastinum/Nodes: Significant interval increase in lower cervical, superior mediastinal and periaortic lymphadenopathy paraesophageal lymph node or conglomerate mass measures 4.3 x 4.3 cm, previously 3.4 x 2.7 cm (series 2, image 28). Newly enlarged index left superior mediastinal/lower cervical lymph node measures 2.3 x 1.9 cm (series 2, image 7). Thyroid gland, trachea, and esophagus demonstrate no significant findings. Lungs/Pleura: Significant interval increase in a large right pleural effusion, with near total atelectasis of the right lung on today's examination. Treated mass of the posterior and apical right upper lobe is almost completely obscured by atelectasis although not obviously changed (series 2, image 15). Extensive, new and enlarged right-sided pleural thickening and nodularity with numerous new pleural nodules about the right lung base. A large nodule of the peripheral right upper lobe is significantly increased in size, measuring 3.9 x 3.0 cm, previously 2.5 x 1.1 cm (series 2, image 18). Index new nodule of the anterior inferior right lung base measures 3.3 x 1.4 cm (series 2, image 47). Volume loss of the left hemithorax secondary to compression and mediastinal shift. Moderate, predominantly paraseptal emphysema of the left lung  with mild fibrotic change of the left lung characterized by varicoid bronchiectasis, irregular interstitial opacity, ground-glass, unchanged (series 6, image 122). Musculoskeletal: No chest wall abnormality. No acute osseous findings. CT ABDOMEN PELVIS FINDINGS Hepatobiliary: No solid liver abnormality is seen. Stable, benign small cysts or hemangiomata of the liver for which no further follow-up or characterization is required. No gallstones, gallbladder wall thickening, or biliary dilatation. Pancreas: Unremarkable. No pancreatic ductal dilatation or  surrounding inflammatory changes. Spleen: Normal in size without significant abnormality. Unchanged, benign cyst of the inferior spleen, for which no further follow-up or characterization is required (series 2, image 66). Adrenals/Urinary Tract: Adrenal glands are unremarkable. Nonobstructive left renal calculus. No right-sided calculi, ureteral calculi, or hydronephrosis. Bladder is unremarkable. Stomach/Bowel: Stomach is within normal limits. Appendix appears normal. No evidence of bowel wall thickening, distention, or inflammatory changes. Vascular/Lymphatic: Aortic atherosclerosis. Newly enlarged right crural lymph node measuring up to 1.7 x 1.3 cm (series 2, image 66). Reproductive: No mass or other abnormality. Other: Small, fat containing left inguinal hernia.  No ascites. Musculoskeletal: No acute osseous findings. IMPRESSION: 1. Significant interval increase in a large right pleural effusion, with near total atelectasis of the right lung on today's examination. Extensive, new and enlarged pleural thickening and nodularity throughout the right hemithorax. 2. Treated mass of the posterior and apical right upper lobe is almost completely obscured by atelectasis although not obviously changed. 3. Significant interval increase in lower cervical, superior mediastinal, and periaortic lymphadenopathy as well as a newly enlarged right crural lymph node in the included upper abdomen. 4. Constellation of findings is consistent with worsened pleural and nodal metastatic disease in the setting of primary lung malignancy. 5. For the purposes of RECIST reporting, initially identified target lesion of the right upper lobe is not clearly appreciated due to consolidation and atelectasis and initially identified target lymph node in the pretracheal nodal station is not noted. 6. Relatively expansile appearance of the right brachial and axillary veins, with soft tissue stranding in the right axilla, suspicious for deep venous  thrombosis. Consider right upper extremity DVT ultrasound if indicated by clinical concern. 7. Coronary artery disease. 8. Nonobstructive left nephrolithiasis. Aortic Atherosclerosis (ICD10-I70.0) and Emphysema (ICD10-J43.9). Electronically Signed   By: Delanna Ahmadi M.D.   On: 10/19/2021 11:16     ASSESSMENT/PLAN:  This is a very pleasant 67 year old African-American male who was initially diagnosed with limited stage small cell lung cancer in February 2022. The patient had evidence of disease recurrence and extensive stage small cell lung cancer and resumed treatment in December 2022   The patient initially underwent treatment with NRG [id number] clinical trial where the patient would be randomized to treatment with concurrent chemoradiation with cisplatin and/or etoposide versus concurrent chemoradiation with the same regimen in addition to atezolizumab. He is status post 3 cycles of the treatment.  His treatment was discontinued secondary to intolerance and the patient had been in observation from April 2022-December 2022.  Repeat CT scan of the chest, abdomen, pelvis showed evidence of disease progression with a new mass centered in the superior aspect of the right major fissure, widespread nodularity in the adjacent portion of the upper right lung, multiple new pleural-based lesions in the right hemithorax, new middle mediastinal/lymphadenopathy, new hypervascular lesion in the proximal body of the pancreas and a new lytic lesions in the parasymphyseal region of the left to pubic bone.   His insurance declined a staging PET scan.   Therefore, Dr. Julien Nordmann started the patient on palliative systemic chemotherapy  with carboplatin for an AUC of 5 on day 1, etoposide 100 mg per metered squared on days 1, 2, and 3 with Cosela for myelosuppression in addition to immunotherapy with Imfinzi 1500 mg.  He status post 5 cycles.  He tolerated it fairly well except he had decreased p.o. intake and weight loss.   Starting from cycle #5, the patient started maintenance immunotherapy with Imfinzi 1500 mg IV every 4 weeks.This was discontinued due to disease progression.    The patient also completed palliative radiation to the left hip under the care of Dr. Tammi Klippel.  This was completed on 01/03/2021   The patient had a restaging CT scan performed in April 2023.  The scan showed evidence of disease progression with interval enlargement of a bulky low left hilar or periaortic lymph node or soft tissue mass, as well as smaller lymph nodes within the superior mediastinum. There was also interval enlargement of multiple right-sided masses and slightly increased right pleural effusion  The patient is currently undergoing a third line treatment with Zepzelca (lurbinectedin) 3.2 Mg/M2 every 3 weeks status post 6 cycles.  He was admitted to the hospital after cycle #2 with significant chemotherapy-induced pancytopenia and neutropenic fever as well as left hip subcutaneous abscess.  The patient has been tolerating his treatment with Zepzelca (lurbinectedin) fairly well with no concerning adverse effect except for fatigue.  The patient was seen with Dr. Julien Nordmann today.  Dr. Julien Nordmann personally independently reviewed the patient's restaging CT scan from last week.  Unfortunately, the scan showed evidence of disease progression.   Dr. Jaymes Graff lengthy discussion with the patient today about his current condition.  Dr. Julien Nordmann the patient the option between palliative care and hospice versus further palliative systemic chemotherapy.  The patient stated that he is not ready to "give up".  Therefore, Dr. Julien Nordmann given the option of Irinotecan days 1 and 8 IV every 3 weeks.  The patient is interested in this option and he is expected to start his first dose of treatment next week.  The patient was given a handout on his AVS about irinotecan. Adverse side effects were discussed including but not limited to fatigue, nausea, vomiting,  diarrhea, kidney, and liver dysfunction.  Regarding the patient's recurrent pleural effusion, the patient was previously seen by Dr. Erin Fulling at pulmonology.  I have placed a referral to pulmonary medicine for consideration of a Pleurx.  Patient was advised to call the clinic if he feels like he needs a repeat thoracentesis in the interval.  Encouraged him to call before he feels like he has significant/emergent shortness of breath as sometimes it may take a few days to schedule outpatient thoracentesis.  I have added sample blood bank to the patient's lab orders in the event that he requires blood transfusion in the future.  Hemoglobin is 9.6 today.  No need for blood transfusion at this time.  The patient was advised to call immediately if she has any concerning symptoms in the interval. The patient voices understanding of current disease status and treatment options and is in agreement with the current care plan. All questions were answered. The patient knows to call the clinic with any problems, questions or concerns. We can certainly see the patient much sooner if necessary  No orders of the defined types were placed in this encounter.     Leldon Steege L Hilary Milks, PA-C 10/25/21  ADDENDUM: Hematology/Oncology Attending: I had a face-to-face encounter with the patient today.  I reviewed his record, lab, scan and  recommended his care plan.  This is a very pleasant 66 years old African-American male with metastatic small cell lung cancer that was initially diagnosed as limited stage disease in February 2022 and he has evidence for disease progression in December 2022.  The patient status post several chemotherapy regimens including systemic chemotherapy with cisplatin on the etoposide concurrent with radiation on a clinical trial but he had evidence for disease progression and started treatment again with carboplatin, etoposide and Imfinzi for 5 cycles discontinued secondary to disease  progression.  The patient then started treatment with second line chemotherapy with Zepzelca (lurbinectedin) status post 6 cycles but unfortunately his most recent imaging studies showed evidence for disease progression. He underwent ultrasound-guided right thoracentesis twice recently with drainage of around 3.5 L last week. His last imaging studies showed evidence for disease progression. I had a lengthy discussion with the patient today about his current condition and treatment options. I strongly encouraged the patient to consider palliative care and hospice but he is not willing to go with this option and would like to try some other form of systemic therapy if available. I will arrange for the patient to start treatment with irinotecan 65 Mg/M2 on days 1 and 8 every 3 weeks.  I discussed with the patient the adverse effect of this treatment and we have provided him with a handout about irinotecan. Regarding the recurrent right pleural effusion, we will arrange for the patient to see his pulmonologist for close monitoring and consideration of Pleurx catheter placement if needed. The patient is expected to start the first cycle of his treatment next week. He will come back for follow-up visit with day 8 of the treatment. He was advised to call immediately if he has any concerning symptoms in the interval. The total time spent in the appointment was 30 minutes. Disclaimer: This note was dictated with voice recognition software. Similar sounding words can inadvertently be transcribed and may be missed upon review. Eilleen Kempf, MD

## 2021-10-25 NOTE — Telephone Encounter (Signed)
Called patient regarding upcoming October and November appointments, patient is notified.  

## 2021-10-26 ENCOUNTER — Inpatient Hospital Stay: Payer: Commercial Managed Care - HMO

## 2021-10-26 ENCOUNTER — Other Ambulatory Visit: Payer: Self-pay

## 2021-10-26 ENCOUNTER — Inpatient Hospital Stay (HOSPITAL_BASED_OUTPATIENT_CLINIC_OR_DEPARTMENT_OTHER): Payer: Commercial Managed Care - HMO | Admitting: Physician Assistant

## 2021-10-26 VITALS — BP 106/70 | HR 120 | Temp 98.1°F | Resp 16 | Ht 69.0 in | Wt 165.2 lb

## 2021-10-26 DIAGNOSIS — J90 Pleural effusion, not elsewhere classified: Secondary | ICD-10-CM | POA: Diagnosis not present

## 2021-10-26 DIAGNOSIS — I700 Atherosclerosis of aorta: Secondary | ICD-10-CM | POA: Diagnosis not present

## 2021-10-26 DIAGNOSIS — C3411 Malignant neoplasm of upper lobe, right bronchus or lung: Secondary | ICD-10-CM

## 2021-10-26 DIAGNOSIS — Z87891 Personal history of nicotine dependence: Secondary | ICD-10-CM | POA: Diagnosis not present

## 2021-10-26 DIAGNOSIS — Z7189 Other specified counseling: Secondary | ICD-10-CM

## 2021-10-26 DIAGNOSIS — Z5111 Encounter for antineoplastic chemotherapy: Secondary | ICD-10-CM | POA: Diagnosis not present

## 2021-10-26 DIAGNOSIS — Z923 Personal history of irradiation: Secondary | ICD-10-CM | POA: Diagnosis not present

## 2021-10-26 DIAGNOSIS — K4090 Unilateral inguinal hernia, without obstruction or gangrene, not specified as recurrent: Secondary | ICD-10-CM | POA: Diagnosis not present

## 2021-10-26 DIAGNOSIS — I2510 Atherosclerotic heart disease of native coronary artery without angina pectoris: Secondary | ICD-10-CM | POA: Diagnosis not present

## 2021-10-26 DIAGNOSIS — I10 Essential (primary) hypertension: Secondary | ICD-10-CM | POA: Diagnosis not present

## 2021-10-26 DIAGNOSIS — Z79899 Other long term (current) drug therapy: Secondary | ICD-10-CM | POA: Diagnosis not present

## 2021-10-26 DIAGNOSIS — C7951 Secondary malignant neoplasm of bone: Secondary | ICD-10-CM | POA: Diagnosis present

## 2021-10-26 DIAGNOSIS — N200 Calculus of kidney: Secondary | ICD-10-CM | POA: Diagnosis not present

## 2021-10-26 LAB — CBC WITH DIFFERENTIAL (CANCER CENTER ONLY)
Abs Immature Granulocytes: 0.02 10*3/uL (ref 0.00–0.07)
Basophils Absolute: 0 10*3/uL (ref 0.0–0.1)
Basophils Relative: 0 %
Eosinophils Absolute: 0 10*3/uL (ref 0.0–0.5)
Eosinophils Relative: 0 %
HCT: 29.5 % — ABNORMAL LOW (ref 39.0–52.0)
Hemoglobin: 9.2 g/dL — ABNORMAL LOW (ref 13.0–17.0)
Immature Granulocytes: 0 %
Lymphocytes Relative: 6 %
Lymphs Abs: 0.5 10*3/uL — ABNORMAL LOW (ref 0.7–4.0)
MCH: 28.9 pg (ref 26.0–34.0)
MCHC: 31.2 g/dL (ref 30.0–36.0)
MCV: 92.8 fL (ref 80.0–100.0)
Monocytes Absolute: 1.2 10*3/uL — ABNORMAL HIGH (ref 0.1–1.0)
Monocytes Relative: 13 %
Neutro Abs: 7.4 10*3/uL (ref 1.7–7.7)
Neutrophils Relative %: 81 %
Platelet Count: 257 10*3/uL (ref 150–400)
RBC: 3.18 MIL/uL — ABNORMAL LOW (ref 4.22–5.81)
RDW: 19.6 % — ABNORMAL HIGH (ref 11.5–15.5)
WBC Count: 9.1 10*3/uL (ref 4.0–10.5)
nRBC: 0 % (ref 0.0–0.2)

## 2021-10-26 LAB — CMP (CANCER CENTER ONLY)
ALT: 42 U/L (ref 0–44)
AST: 41 U/L (ref 15–41)
Albumin: 3.4 g/dL — ABNORMAL LOW (ref 3.5–5.0)
Alkaline Phosphatase: 65 U/L (ref 38–126)
Anion gap: 10 (ref 5–15)
BUN: 23 mg/dL (ref 8–23)
CO2: 27 mmol/L (ref 22–32)
Calcium: 10.2 mg/dL (ref 8.9–10.3)
Chloride: 101 mmol/L (ref 98–111)
Creatinine: 0.83 mg/dL (ref 0.61–1.24)
GFR, Estimated: >60 mL/min (ref >60)
Glucose, Bld: 103 mg/dL — ABNORMAL HIGH (ref 70–99)
Potassium: 4.1 mmol/L (ref 3.5–5.1)
Sodium: 138 mmol/L (ref 135–145)
Total Bilirubin: 0.5 mg/dL (ref 0.3–1.2)
Total Protein: 7.8 g/dL (ref 6.5–8.1)

## 2021-10-26 NOTE — Progress Notes (Signed)
DISCONTINUE OFF PATHWAY REGIMEN - Small Cell Lung   [id number]:Lurbinectedin 3.2 mg/m2 IV D1 q21 Days:   A cycle is every 21 days:     Lurbinectedin   **Always confirm dose/schedule in your pharmacy ordering system**  REASON: Disease Progression PRIOR TREATMENT: Off Pathway: Lurbinectedin 3.2 mg/m2 IV D1 q21 Days TREATMENT RESPONSE: Stable Disease (SD)    Patient Characteristics: Relapsed or Progressive Disease, Third Line and Beyond Therapeutic Status: Relapsed or Progressive Disease Line of Therapy: Third [employer] and [employer]

## 2021-10-27 ENCOUNTER — Telehealth: Payer: Self-pay | Admitting: Physician Assistant

## 2021-10-27 NOTE — Telephone Encounter (Signed)
Scheduled per 10/19 work-queue and los, patient has been called and notified of upcoming appointments.

## 2021-10-31 MED FILL — Dexamethasone Sodium Phosphate Inj 100 MG/10ML: INTRAMUSCULAR | Qty: 1 | Status: AC

## 2021-11-01 ENCOUNTER — Encounter (HOSPITAL_BASED_OUTPATIENT_CLINIC_OR_DEPARTMENT_OTHER): Payer: Self-pay | Admitting: Family Medicine

## 2021-11-01 ENCOUNTER — Inpatient Hospital Stay: Payer: Commercial Managed Care - HMO

## 2021-11-01 ENCOUNTER — Other Ambulatory Visit: Payer: Self-pay | Admitting: Medical Oncology

## 2021-11-01 ENCOUNTER — Other Ambulatory Visit: Payer: Self-pay

## 2021-11-01 ENCOUNTER — Ambulatory Visit (INDEPENDENT_AMBULATORY_CARE_PROVIDER_SITE_OTHER): Payer: Commercial Managed Care - HMO | Admitting: Family Medicine

## 2021-11-01 VITALS — BP 101/65 | HR 110 | Resp 16

## 2021-11-01 VITALS — BP 99/69 | HR 111 | Temp 97.8°F | Ht 69.0 in | Wt 163.0 lb

## 2021-11-01 DIAGNOSIS — C7951 Secondary malignant neoplasm of bone: Secondary | ICD-10-CM | POA: Diagnosis not present

## 2021-11-01 DIAGNOSIS — I10 Essential (primary) hypertension: Secondary | ICD-10-CM

## 2021-11-01 DIAGNOSIS — D649 Anemia, unspecified: Secondary | ICD-10-CM

## 2021-11-01 DIAGNOSIS — C3411 Malignant neoplasm of upper lobe, right bronchus or lung: Secondary | ICD-10-CM

## 2021-11-01 DIAGNOSIS — C3491 Malignant neoplasm of unspecified part of right bronchus or lung: Secondary | ICD-10-CM

## 2021-11-01 DIAGNOSIS — Z95828 Presence of other vascular implants and grafts: Secondary | ICD-10-CM

## 2021-11-01 LAB — SAMPLE TO BLOOD BANK

## 2021-11-01 LAB — CMP (CANCER CENTER ONLY)
ALT: 39 U/L (ref 0–44)
AST: 36 U/L (ref 15–41)
Albumin: 3.1 g/dL — ABNORMAL LOW (ref 3.5–5.0)
Alkaline Phosphatase: 67 U/L (ref 38–126)
Anion gap: 10 (ref 5–15)
BUN: 36 mg/dL — ABNORMAL HIGH (ref 8–23)
CO2: 26 mmol/L (ref 22–32)
Calcium: 9.7 mg/dL (ref 8.9–10.3)
Chloride: 103 mmol/L (ref 98–111)
Creatinine: 1.01 mg/dL (ref 0.61–1.24)
GFR, Estimated: >60 mL/min (ref >60)
Glucose, Bld: 162 mg/dL — ABNORMAL HIGH (ref 70–99)
Potassium: 3.8 mmol/L (ref 3.5–5.1)
Sodium: 139 mmol/L (ref 135–145)
Total Bilirubin: 0.4 mg/dL (ref 0.3–1.2)
Total Protein: 7.4 g/dL (ref 6.5–8.1)

## 2021-11-01 LAB — CBC WITH DIFFERENTIAL (CANCER CENTER ONLY)
Abs Immature Granulocytes: 0.05 10*3/uL (ref 0.00–0.07)
Basophils Absolute: 0 10*3/uL (ref 0.0–0.1)
Basophils Relative: 0 %
Eosinophils Absolute: 0.1 10*3/uL (ref 0.0–0.5)
Eosinophils Relative: 1 %
HCT: 24.6 % — ABNORMAL LOW (ref 39.0–52.0)
Hemoglobin: 7.7 g/dL — ABNORMAL LOW (ref 13.0–17.0)
Immature Granulocytes: 1 %
Lymphocytes Relative: 7 %
Lymphs Abs: 0.6 10*3/uL — ABNORMAL LOW (ref 0.7–4.0)
MCH: 29.1 pg (ref 26.0–34.0)
MCHC: 31.3 g/dL (ref 30.0–36.0)
MCV: 92.8 fL (ref 80.0–100.0)
Monocytes Absolute: 0.7 10*3/uL (ref 0.1–1.0)
Monocytes Relative: 8 %
Neutro Abs: 7.3 10*3/uL (ref 1.7–7.7)
Neutrophils Relative %: 83 %
Platelet Count: 285 10*3/uL (ref 150–400)
RBC: 2.65 MIL/uL — ABNORMAL LOW (ref 4.22–5.81)
RDW: 20.2 % — ABNORMAL HIGH (ref 11.5–15.5)
WBC Count: 8.8 10*3/uL (ref 4.0–10.5)
nRBC: 0 % (ref 0.0–0.2)

## 2021-11-01 MED ORDER — ATROPINE SULFATE 1 MG/ML IV SOLN
0.5000 mg | Freq: Once | INTRAVENOUS | Status: AC | PRN
  Administered 2021-11-01: 0.5 mg via INTRAVENOUS
  Filled 2021-11-01: qty 1

## 2021-11-01 MED ORDER — SODIUM CHLORIDE 0.9 % IV SOLN
65.0000 mg/m2 | Freq: Once | INTRAVENOUS | Status: AC
  Administered 2021-11-01: 120 mg via INTRAVENOUS
  Filled 2021-11-01: qty 6

## 2021-11-01 MED ORDER — HEPARIN SOD (PORK) LOCK FLUSH 100 UNIT/ML IV SOLN
500.0000 [IU] | Freq: Once | INTRAVENOUS | Status: AC | PRN
  Administered 2021-11-01: 500 [IU]

## 2021-11-01 MED ORDER — PALONOSETRON HCL INJECTION 0.25 MG/5ML
0.2500 mg | Freq: Once | INTRAVENOUS | Status: AC
  Administered 2021-11-01: 0.25 mg via INTRAVENOUS
  Filled 2021-11-01: qty 5

## 2021-11-01 MED ORDER — SODIUM CHLORIDE 0.9% FLUSH
10.0000 mL | INTRAVENOUS | Status: DC | PRN
  Administered 2021-11-01: 10 mL

## 2021-11-01 MED ORDER — SODIUM CHLORIDE 0.9% FLUSH
10.0000 mL | Freq: Once | INTRAVENOUS | Status: AC
  Administered 2021-11-01: 10 mL

## 2021-11-01 MED ORDER — SODIUM CHLORIDE 0.9 % IV SOLN
10.0000 mg | Freq: Once | INTRAVENOUS | Status: AC
  Administered 2021-11-01: 10 mg via INTRAVENOUS
  Filled 2021-11-01: qty 10

## 2021-11-01 MED ORDER — SODIUM CHLORIDE 0.9 % IV SOLN: Freq: Once | INTRAVENOUS | Status: AC

## 2021-11-01 NOTE — Progress Notes (Signed)
Per Dr. Julien Nordmann- ok to treat today with a hemoglobin of 7.7 and heart rate of 118. Discussed patient condition with Dr. Julien Nordmann- mildly SOB, low BP, exhausted- see flowsheet. Patient to receive 2 units of blood tomorrow- Dr. Julien Nordmann stated to continue with treatment.

## 2021-11-01 NOTE — Patient Instructions (Signed)
  Medication Instructions:  Your physician recommends that you continue on your current medications as directed. Please refer to the Current Medication list given to you today. --If you need a refill on any your medications before your next appointment, please call your pharmacy first. If no refills are authorized on file call the office.-- Lab Work: Your physician has recommended that you have lab work today: No If you have labs (blood work) drawn today and your tests are completely normal, you will receive your results via [facility] a phone call from our staff.  Please ensure you check your voicemail in the event that you authorized detailed messages to be left on a delegated number. If you have any lab test that is abnormal or we need to change your treatment, we will call you to review the results.  Referrals/Procedures/Imaging: No  Follow-Up: Your next appointment:   Your physician recommends that you schedule a follow-up appointment in: 2 months HTN with Dr. de [place].  You will receive a text message or e-mail with a link to a survey about your care and experience with [place] today! We would greatly appreciate your feedback!   Thanks for letting us be apart of your health journey!!  Primary Care and Sports Medicine   Dr. Arlina Robes [place]   We encourage you to activate your patient portal called "MyChart".  Sign up information is provided on this After Visit Summary.  MyChart is used to connect with patients for Virtual Visits (Telemedicine).  Patients are able to view lab/test results, encounter notes, upcoming appointments, etc.  Non-urgent messages can be sent to your provider as well. To learn more about what you can do with MyChart, please visit --  [email or web address].

## 2021-11-01 NOTE — Assessment & Plan Note (Signed)
Blood pressure is slightly low in office today.  He continues to be off of all antihypertensive medications which were stopped at time of last hospitalization.  He does occasionally have lightheadedness or dizziness which he notices with positional changes, only tends to occur when he stands up.  When this lightheadedness or dizziness does occur, it is typically brief and then resolves.  He has not had any recent falls as a result of this.  Otherwise, he denies any current issues with shortness of breath or cough. On exam, patient is in no acute distress, vital signs as above.  Blood pressure is a bit lower than last office visit and pulse rate continues to be slightly tachycardic, similar to last office visit.  Cardiovascular exam with regular rhythm, slightly tachycardic rate.  Lungs are clear to auscultation bilaterally today. Discussed potential causes for intermittent lightheadedness and dizziness, suspect this is more related to orthostatic blood pressure changes.  Recommend continuing to monitor symptoms, recommend waiting to begin walking after standing until symptoms have resolved in order to lower risk of any falls. We will plan for follow-up in about 6 to 8 weeks to continue with monitoring or sooner as needed.

## 2021-11-01 NOTE — Progress Notes (Signed)
    Procedures performed today:    None.  Independent interpretation of notes and tests performed by another provider:   None.  Brief History, Exam, Impression, and Recommendations:    BP 99/69   Pulse (!) 111   Temp 97.8 F (36.6 C) (Oral)   Ht 5\' 9"  (1.753 m)   Wt 163 lb (73.9 kg)   SpO2 97%   BMI 24.07 kg/m   HTN (hypertension) Blood pressure is slightly low in office today.  He continues to be off of all antihypertensive medications which were stopped at time of last hospitalization.  He does occasionally have lightheadedness or dizziness which he notices with positional changes, only tends to occur when he stands up.  When this lightheadedness or dizziness does occur, it is typically brief and then resolves.  He has not had any recent falls as a result of this.  Otherwise, he denies any current issues with shortness of breath or cough. On exam, patient is in no acute distress, vital signs as above.  Blood pressure is a bit lower than last office visit and pulse rate continues to be slightly tachycardic, similar to last office visit.  Cardiovascular exam with regular rhythm, slightly tachycardic rate.  Lungs are clear to auscultation bilaterally today. Discussed potential causes for intermittent lightheadedness and dizziness, suspect this is more related to orthostatic blood pressure changes.  Recommend continuing to monitor symptoms, recommend waiting to begin walking after standing until symptoms have resolved in order to lower risk of any falls. We will plan for follow-up in about 6 to 8 weeks to continue with monitoring or sooner as needed.  Return in about 2 months (around 01/01/2022) for HTN.   ___________________________________________ Charisa Twitty de [place], MD, ABFM, [facility] and [place]

## 2021-11-01 NOTE — Patient Instructions (Signed)
[facility]  Discharge Instructions: Thank you for choosing [facility] to provide your oncology and hematology care.   If you have a lab appointment with the [facility], please go directly to the [facility] and check in at the registration area.   Wear comfortable clothing and clothing appropriate for easy access to any Portacath or PICC line.   We strive to give you quality time with your provider. You may need to reschedule your appointment if you arrive late (15 or more minutes).  Arriving late affects you and other patients whose appointments are after yours.  Also, if you miss three or more appointments without notifying the office, you may be dismissed from the clinic at the provider's discretion.      For prescription refill requests, have your pharmacy contact our office and allow 72 hours for refills to be completed.    Today you received the following chemotherapy and/or immunotherapy agents: Irinotecan (Camptosar)      To help prevent nausea and vomiting after your treatment, we encourage you to take your nausea medication as directed.  BELOW ARE SYMPTOMS THAT SHOULD BE REPORTED IMMEDIATELY: *FEVER GREATER THAN 100.4 F (38 C) OR HIGHER *CHILLS OR SWEATING *NAUSEA AND VOMITING THAT IS NOT CONTROLLED WITH YOUR NAUSEA MEDICATION *UNUSUAL SHORTNESS OF BREATH *UNUSUAL BRUISING OR BLEEDING *URINARY PROBLEMS (pain or burning when urinating, or frequent urination) *BOWEL PROBLEMS (unusual diarrhea, constipation, pain near the anus) TENDERNESS IN MOUTH AND THROAT WITH OR WITHOUT PRESENCE OF ULCERS (sore throat, sores in mouth, or a toothache) UNUSUAL RASH, SWELLING OR PAIN  UNUSUAL VAGINAL DISCHARGE OR ITCHING   Items with * indicate a potential emergency and should be followed up as soon as possible or go to the Emergency Department if any problems should occur.  Please show the CHEMOTHERAPY ALERT CARD or IMMUNOTHERAPY ALERT CARD at  check-in to the Emergency Department and triage nurse.  Should you have questions after your visit or need to cancel or reschedule your appointment, please contact [facility]  Dept: [phone]  and follow the prompts.  Office hours are 8:00 a.m. to 4:30 p.m. Monday - Friday. Please note that voicemails left after 4:00 p.m. may not be returned until the following business day.  We are closed weekends and major holidays. You have access to a nurse at all times for urgent questions. Please call the main number to the clinic Dept: [phone] and follow the prompts.   For any non-urgent questions, you may also contact your provider using MyChart. We now offer e-Visits for anyone 64 and older to request care online for non-urgent symptoms. For details visit [email or web address].   Also download the MyChart app! Go to the app store, search "MyChart", open the app, select [facility], and log in with your MyChart username and password.  Masks are optional in the cancer centers. If you would like for your care team to wear a mask while they are taking care of you, please let them know. You may have one support person who is at least 64 years old accompany you for your appointments. Irinotecan Injection What is this medication? IRINOTECAN (ir in oh TEE kan) treats some types of cancer. It works by slowing down the growth of cancer cells. This medicine may be used for other purposes; ask your health care provider or pharmacist if you have questions. COMMON BRAND NAME(S): Camptosar What should I tell my care team before I take this  medication? They need to know if you have any of these conditions: Dehydration Diarrhea Infection, especially a viral infection, such as chickenpox, cold sores, herpes Liver disease Low blood cell levels (white cells, red cells, and platelets) Low levels of electrolytes, such as calcium, magnesium, or potassium in your blood Recent or  ongoing radiation An unusual or allergic reaction to irinotecan, other medications, foods, dyes, or preservatives If you or your partner are pregnant or trying to get pregnant Breast-feeding How should I use this medication? This medication is injected into a vein. It is given by your care team in a hospital or clinic setting. Talk to your care team about the use of this medication in children. Special care may be needed. Overdosage: If you think you have taken too much of this medicine contact a poison control center or emergency room at once. NOTE: This medicine is only for you. Do not share this medicine with others. What if I miss a dose? Keep appointments for follow-up doses. It is important not to miss your dose. Call your care team if you are unable to keep an appointment. What may interact with this medication? Do not take this medication with any of the following: Cobicistat Itraconazole This medication may also interact with the following: Certain antibiotics, such as clarithromycin, rifampin, rifabutin Certain antivirals for HIV or AIDS Certain medications for fungal infections, such as ketoconazole, posaconazole, voriconazole Certain medications for seizures, such as carbamazepine, phenobarbital, phenytoin Gemfibrozil Nefazodone St. John's wort This list may not describe all possible interactions. Give your health care provider a list of all the medicines, herbs, non-prescription drugs, or dietary supplements you use. Also tell them if you smoke, drink alcohol, or use illegal drugs. Some items may interact with your medicine. What should I watch for while using this medication? Your condition will be monitored carefully while you are receiving this medication. You may need blood work while taking this medication. This medication may make you feel generally unwell. This is not uncommon as chemotherapy can affect healthy cells as well as cancer cells. Report any side effects.  Continue your course of treatment even though you feel ill unless your care team tells you to stop. This medication can cause serious side effects. To reduce the risk, your care team may give you other medications to take before receiving this one. Be sure to follow the directions from your care team. This medication may affect your coordination, reaction time, or judgement. Do not drive or operate machinery until you know how this medication affects you. Sit up or stand slowly to reduce the risk of dizzy or fainting spells. Drinking alcohol with this medication can increase the risk of these side effects. This medication may increase your risk of getting an infection. Call your care team for advice if you get a fever, chills, sore throat, or other symptoms of a cold or flu. Do not treat yourself. Try to avoid being around people who are sick. Avoid taking medications that contain aspirin, acetaminophen, ibuprofen, naproxen, or ketoprofen unless instructed by your care team. These medications may hide a fever. This medication may increase your risk to bruise or bleed. Call your care team if you notice any unusual bleeding. Be careful brushing or flossing your teeth or using a toothpick because you may get an infection or bleed more easily. If you have any dental work done, tell your dentist you are receiving this medication. Talk to your care team if you or your partner are pregnant or  think either of you might be pregnant. This medication can cause serious birth defects if taken during pregnancy and for 6 months after the last dose. You will need a negative pregnancy test before starting this medication. Contraception is recommended while taking this medication and for 6 months after the last dose. Your care team can help you find the option that works for you. Do not father a child while taking this medication and for 3 months after the last dose. Use a condom for contraception during this time period. Do  not breastfeed while taking this medication and for 7 days after the last dose. This medication may cause infertility. Talk to your care team if you are concerned about your fertility. What side effects may I notice from receiving this medication? Side effects that you should report to your care team as soon as possible: Allergic reactions--skin rash, itching, hives, swelling of the face, lips, tongue, or throat Dry cough, shortness of breath or trouble breathing Increased saliva or tears, increased sweating, stomach cramping, diarrhea, small pupils, unusual weakness or fatigue, slow heartbeat Infection--fever, chills, cough, sore throat, wounds that don't heal, pain or trouble when passing urine, general feeling of discomfort or being unwell Kidney injury--decrease in the amount of urine, swelling of the ankles, hands, or feet Low red blood cell level--unusual weakness or fatigue, dizziness, headache, trouble breathing Severe or prolonged diarrhea Unusual bruising or bleeding Side effects that usually do not require medical attention (report to your care team if they continue or are bothersome): Constipation Diarrhea Hair loss Loss of appetite Nausea Stomach pain This list may not describe all possible side effects. Call your doctor for medical advice about side effects. You may report side effects to FDA at 1-800-FDA-1088. Where should I keep my medication? This medication is given in a hospital or clinic. It will not be stored at home. NOTE: This sheet is a summary. It may not cover all possible information. If you have questions about this medicine, talk to your doctor, pharmacist, or health care provider.  2023 Elsevier/Gold Standard (2021-05-04 00:00:00)

## 2021-11-01 NOTE — Progress Notes (Signed)
Transfusion orders sent.

## 2021-11-02 ENCOUNTER — Encounter: Payer: Self-pay | Admitting: Emergency Medicine

## 2021-11-02 ENCOUNTER — Ambulatory Visit (INDEPENDENT_AMBULATORY_CARE_PROVIDER_SITE_OTHER): Payer: Commercial Managed Care - HMO | Admitting: Emergency Medicine

## 2021-11-02 ENCOUNTER — Inpatient Hospital Stay (HOSPITAL_BASED_OUTPATIENT_CLINIC_OR_DEPARTMENT_OTHER): Payer: Commercial Managed Care - HMO

## 2021-11-02 VITALS — BP 120/72 | HR 108 | Temp 97.8°F | Ht 69.0 in | Wt 167.8 lb

## 2021-11-02 DIAGNOSIS — J90 Pleural effusion, not elsewhere classified: Secondary | ICD-10-CM | POA: Diagnosis not present

## 2021-11-02 DIAGNOSIS — D649 Anemia, unspecified: Secondary | ICD-10-CM

## 2021-11-02 DIAGNOSIS — J910 Malignant pleural effusion: Secondary | ICD-10-CM | POA: Diagnosis not present

## 2021-11-02 DIAGNOSIS — C7951 Secondary malignant neoplasm of bone: Secondary | ICD-10-CM | POA: Diagnosis not present

## 2021-11-02 DIAGNOSIS — C3411 Malignant neoplasm of upper lobe, right bronchus or lung: Secondary | ICD-10-CM

## 2021-11-02 MED ORDER — SODIUM CHLORIDE 0.9% IV SOLUTION
250.0000 mL | Freq: Once | INTRAVENOUS | Status: AC
  Administered 2021-11-02: 250 mL via INTRAVENOUS

## 2021-11-02 MED ORDER — HEPARIN SOD (PORK) LOCK FLUSH 100 UNIT/ML IV SOLN
500.0000 [IU] | Freq: Every day | INTRAVENOUS | Status: AC | PRN
  Administered 2021-11-02: 500 [IU]

## 2021-11-02 MED ORDER — ACETAMINOPHEN 325 MG PO TABS
650.0000 mg | ORAL_TABLET | Freq: Once | ORAL | Status: AC
  Administered 2021-11-02: 650 mg via ORAL
  Filled 2021-11-02: qty 2

## 2021-11-02 MED ORDER — DIPHENHYDRAMINE HCL 25 MG PO CAPS
25.0000 mg | ORAL_CAPSULE | Freq: Once | ORAL | Status: AC
  Administered 2021-11-02: 25 mg via ORAL
  Filled 2021-11-02: qty 1

## 2021-11-02 MED ORDER — SODIUM CHLORIDE 0.9% FLUSH
10.0000 mL | INTRAVENOUS | Status: AC | PRN
  Administered 2021-11-02: 10 mL

## 2021-11-02 NOTE — Assessment & Plan Note (Signed)
Large right pleural effusion with associated dyspnea.  He is failing chemotherapy (third line regimen).  I think it is reasonable to offer him Pleurx catheter for dyspnea control, palliation.  He understands and agrees.  Discussed with Dr. Valeta Harms to can likely do his procedure next Tuesday 10/31.  I will place a request today.  We will arrange for Pleurx catheter placement by Dr. Valeta Harms.  This will be done as an outpatient at [facility]oscopy on 11/07/2021.  You will need a designated driver.  You will be called with details regarding when to show up, which medications to take, etc. Follow with APP in 1 to 2 weeks Follow with Dr. Valeta Harms in 1 month or next available

## 2021-11-02 NOTE — H&P (View-Only) (Signed)
Subjective:    Patient ID: Adam [name], male    DOB: 03/12/1955, 66 y.o.   MRN: [id number]  HPI 66 year old man, former smoker (20 pack years) with a history of hypertension and small cell lung cancer diagnosed 02/2020 who has been treated with chemoradiation.  Most recent course complicated by large right pleural effusion requiring serial thoracenteses, last on 10/24/2021.  He has recurrent/progressive disease, has progressed even on third line therapy. His breathing did improve some after the thora, but he is starting to build up progressive exertional SOB again.   CT chest abdomen and pelvis 10/18/2021 reviewed by me shows an interval increase and lower cervical, superior mediastinal and periaortic lymphadenopathy with paraesophageal nodes increasing in size.  Significant interval increase in right-sided pleural thickening and nodularity with numerous nodules in the right lung base.  Other nodules are increased in size.   Review of Systems As per HPI  Past Medical History:  Diagnosis Date   Hypertension    Lung cancer ([place])    small cell RUL     Family History  Problem Relation Age of Onset   Heart disease Mother    Cancer Cousin    Breast cancer Neg Hx    Colon cancer Neg Hx    Pancreatic cancer Neg Hx    Prostate cancer Neg Hx      Social History   Socioeconomic History   Marital status: Divorced    Spouse name: Development worker, community   Number of children: Not on file   Years of education: Not on file   Highest education level: Not on file  Occupational History   Not on file  Tobacco Use   Smoking status: Former    Packs/day: 1.00    Years: 20.00    Total pack years: 20.00    Types: Cigarettes    Quit date: 06/09/2018    Years since quitting: 3.4   Smokeless tobacco: Never  Vaping Use   Vaping Use: Never used  Substance and Sexual Activity   Alcohol use: Not Currently   Drug use: Never   Sexual activity: Yes  Other Topics Concern   Not on file  Social History Narrative    Not on file   Social Determinants of Health   Financial Resource Strain: Not on file  Food Insecurity: Not on file  Transportation Needs: Not on file  Physical Activity: Not on file  Stress: Not on file  Social Connections: Not on file  Intimate Partner Violence: Not on file     No Known Allergies   Outpatient Medications Prior to Visit  Medication Sig Dispense Refill   acetaminophen (TYLENOL) 650 MG CR tablet Take 650 mg by mouth every 8 (eight) hours as needed for pain.     bisacodyl (DULCOLAX) 5 MG EC tablet Take 5 mg by mouth daily as needed for moderate constipation.     lidocaine-prilocaine (EMLA) cream Apply 1 application topically as needed. (Patient taking differently: Apply 1 application  topically as needed (to access port).) 30 g 2   potassium chloride SA (KLOR-CON M) 20 MEQ tablet Take 1 tablet (20 mEq total) by mouth daily. 7 tablet 0   mirtazapine (REMERON) 15 MG tablet Take 2 tablets (30 mg total) by mouth at bedtime. (Patient not taking: Reported on 11/02/2021) 30 tablet 2   No facility-administered medications prior to visit.        Objective:   Physical Exam  Vitals:   11/02/21 1450  BP: 120/72  Pulse: Marland Kitchen)  108  Temp: 97.8 F (36.6 C)  SpO2: 93%  Weight: 167 lb 12.8 oz (76.1 kg)  Height: 5\' 9"  (1.753 m)   Gen: Pleasant, thin, in no distress,  normal affect  ENT: No lesions,  mouth clear,  oropharynx clear, no postnasal drip  Neck: No JVD, no stridor  Lungs: No use of accessory muscles, normal breath sounds on the left, bronchial breath sounds and decreased air movement on the right  Cardiovascular: RRR, heart sounds normal, no murmur or gallops, no peripheral edema  Musculoskeletal: No deformities, no cyanosis or clubbing  Neuro: alert, awake, non focal  Skin: Warm, no lesions or rash     Assessment & Plan:  Malignant pleural effusion Large right pleural effusion with associated dyspnea.  He is failing chemotherapy (third line regimen).   I think it is reasonable to offer him Pleurx catheter for dyspnea control, palliation.  He understands and agrees.  Discussed with Dr. Valeta Harms to can likely do his procedure next Tuesday 10/31.  I will place a request today.  We will arrange for Pleurx catheter placement by Dr. Valeta Harms.  This will be done as an outpatient at [facility] endoscopy on 11/07/2021.  You will need a designated driver.  You will be called with details regarding when to show up, which medications to take, etc. Follow with APP in 1 to 2 weeks Follow with Dr. Valeta Harms in 1 month or next available   Baltazar Apo, MD, PhD 11/02/2021, 3:34 PM [facility] [phone] or if no answer before 7:00PM call [phone] For any issues after 7:00PM please call eLink [phone]

## 2021-11-02 NOTE — Patient Instructions (Signed)

## 2021-11-02 NOTE — Progress Notes (Signed)
Subjective:    Patient ID: Adam [name], male    DOB: [birth year], 66 y.o.   MRN: [id number]  HPI 66 year old man, former smoker (20 pack years) with a history of hypertension and small cell lung cancer diagnosed 02/2020 who has been treated with chemoradiation.  Most recent course complicated by large right pleural effusion requiring serial thoracenteses, last on 10/24/2021.  He has recurrent/progressive disease, has progressed even on third line therapy. His breathing did improve some after the thora, but he is starting to build up progressive exertional SOB again.   CT chest abdomen and pelvis 10/18/2021 reviewed by me shows an interval increase and lower cervical, superior mediastinal and periaortic lymphadenopathy with paraesophageal nodes increasing in size.  Significant interval increase in right-sided pleural thickening and nodularity with numerous nodules in the right lung base.  Other nodules are increased in size.   Review of Systems As per HPI  Past Medical History:  Diagnosis Date   Hypertension    Lung cancer ([place])    small cell RUL     Family History  Problem Relation Age of Onset   Heart disease Mother    Cancer Cousin    Breast cancer Neg Hx    Colon cancer Neg Hx    Pancreatic cancer Neg Hx    Prostate cancer Neg Hx      Social History   Socioeconomic History   Marital status: Divorced    Spouse name: Development worker, community   Number of children: Not on file   Years of education: Not on file   Highest education level: Not on file  Occupational History   Not on file  Tobacco Use   Smoking status: Former    Packs/day: 1.00    Years: 20.00    Total pack years: 20.00    Types: Cigarettes    Quit date: 06/09/2018    Years since quitting: 3.4   Smokeless tobacco: Never  Vaping Use   Vaping Use: Never used  Substance and Sexual Activity   Alcohol use: Not Currently   Drug use: Never   Sexual activity: Yes  Other Topics Concern   Not on file  Social History Narrative    Not on file   Social Determinants of Health   Financial Resource Strain: Not on file  Food Insecurity: Not on file  Transportation Needs: Not on file  Physical Activity: Not on file  Stress: Not on file  Social Connections: Not on file  Intimate Partner Violence: Not on file     No Known Allergies   Outpatient Medications Prior to Visit  Medication Sig Dispense Refill   acetaminophen (TYLENOL) 650 MG CR tablet Take 650 mg by mouth every 8 (eight) hours as needed for pain.     bisacodyl (DULCOLAX) 5 MG EC tablet Take 5 mg by mouth daily as needed for moderate constipation.     lidocaine-prilocaine (EMLA) cream Apply 1 application topically as needed. (Patient taking differently: Apply 1 application  topically as needed (to access port).) 30 g 2   potassium chloride SA (KLOR-CON M) 20 MEQ tablet Take 1 tablet (20 mEq total) by mouth daily. 7 tablet 0   mirtazapine (REMERON) 15 MG tablet Take 2 tablets (30 mg total) by mouth at bedtime. (Patient not taking: Reported on 11/02/2021) 30 tablet 2   No facility-administered medications prior to visit.        Objective:   Physical Exam  Vitals:   11/02/21 1450  BP: 120/72  Pulse: Marland Kitchen)  108  Temp: 97.8 F (36.6 C)  SpO2: 93%  Weight: 167 lb 12.8 oz (76.1 kg)  Height: 5\' 9"  (1.753 m)   Gen: Pleasant, thin, in no distress,  normal affect  ENT: No lesions,  mouth clear,  oropharynx clear, no postnasal drip  Neck: No JVD, no stridor  Lungs: No use of accessory muscles, normal breath sounds on the left, bronchial breath sounds and decreased air movement on the right  Cardiovascular: RRR, heart sounds normal, no murmur or gallops, no peripheral edema  Musculoskeletal: No deformities, no cyanosis or clubbing  Neuro: alert, awake, non focal  Skin: Warm, no lesions or rash     Assessment & Plan:  Malignant pleural effusion Large right pleural effusion with associated dyspnea.  He is failing chemotherapy (third line regimen).   I think it is reasonable to offer him Pleurx catheter for dyspnea control, palliation.  He understands and agrees.  Discussed with Dr. Valeta Harms to can likely do his procedure next Tuesday 10/31.  I will place a request today.  We will arrange for Pleurx catheter placement by Dr. Valeta Harms.  This will be done as an outpatient at [facility] endoscopy on 11/07/2021.  You will need a designated driver.  You will be called with details regarding when to show up, which medications to take, etc. Follow with APP in 1 to 2 weeks Follow with Dr. Valeta Harms in 1 month or next available   Baltazar Apo, MD, PhD 11/02/2021, 3:34 PM [facility] [phone] or if no answer before 7:00PM call [phone] For any issues after 7:00PM please call eLink [phone]

## 2021-11-02 NOTE — Patient Instructions (Signed)
We will arrange for Pleurx catheter placement by Dr. Valeta Harms.  This will be done as an outpatient at [facility] endoscopy on 11/07/2021.  You will need a designated driver.  You will be called with details regarding when to show up, which medications to take, etc. Follow with APP in 1 to 2 weeks Follow with Dr. Valeta Harms in 1 month or next available

## 2021-11-03 ENCOUNTER — Telehealth: Payer: Self-pay | Admitting: Pulmonary Disease

## 2021-11-03 LAB — TYPE AND SCREEN
ABO/RH(D): O POS
Antibody Screen: NEGATIVE
Unit division: 0
Unit division: 0

## 2021-11-03 LAB — BPAM RBC
Blood Product Expiration Date: 202311222359
Blood Product Expiration Date: 202311222359
ISSUE DATE / TIME: 202310260942
ISSUE DATE / TIME: 202310260942
Unit Type and Rh: 5100
Unit Type and Rh: 5100

## 2021-11-03 NOTE — Telephone Encounter (Signed)
I believe we had hoped to get this arranged for 10/31 > That is Dr Juline Patch procedure day. We can only reschedule (if needed) at a time when Dr Valeta Harms is available.

## 2021-11-03 NOTE — Telephone Encounter (Signed)
Patient wants to try to go to [facility] early on Monday for procedure with Icard, was unable to come in for COVID test in our office.

## 2021-11-06 ENCOUNTER — Ambulatory Visit (HOSPITAL_COMMUNITY)
Admission: RE | Admit: 2021-11-06 | Discharge: 2021-11-06 | Disposition: A | Discharge status: Home / Self Care | Payer: Commercial Managed Care - HMO | Attending: Pulmonary Disease | Admitting: Pulmonary Disease

## 2021-11-06 ENCOUNTER — Ambulatory Visit (HOSPITAL_COMMUNITY): Payer: Commercial Managed Care - HMO

## 2021-11-06 ENCOUNTER — Encounter (HOSPITAL_COMMUNITY)
Admission: RE | Disposition: A | Discharge status: Home / Self Care | Payer: Self-pay | Source: Home / Self Care | Attending: Pulmonary Disease

## 2021-11-06 ENCOUNTER — Encounter (HOSPITAL_COMMUNITY): Payer: Self-pay | Admitting: Pulmonary Disease

## 2021-11-06 DIAGNOSIS — R590 Localized enlarged lymph nodes: Secondary | ICD-10-CM | POA: Diagnosis not present

## 2021-11-06 DIAGNOSIS — J918 Pleural effusion in other conditions classified elsewhere: Secondary | ICD-10-CM

## 2021-11-06 DIAGNOSIS — J910 Malignant pleural effusion: Secondary | ICD-10-CM | POA: Diagnosis not present

## 2021-11-06 DIAGNOSIS — J90 Pleural effusion, not elsewhere classified: Secondary | ICD-10-CM | POA: Diagnosis present

## 2021-11-06 DIAGNOSIS — Z9221 Personal history of antineoplastic chemotherapy: Secondary | ICD-10-CM | POA: Diagnosis not present

## 2021-11-06 DIAGNOSIS — C3411 Malignant neoplasm of upper lobe, right bronchus or lung: Secondary | ICD-10-CM

## 2021-11-06 DIAGNOSIS — Z85118 Personal history of other malignant neoplasm of bronchus and lung: Secondary | ICD-10-CM | POA: Diagnosis not present

## 2021-11-06 DIAGNOSIS — Z87891 Personal history of nicotine dependence: Secondary | ICD-10-CM | POA: Insufficient documentation

## 2021-11-06 DIAGNOSIS — Z923 Personal history of irradiation: Secondary | ICD-10-CM | POA: Insufficient documentation

## 2021-11-06 HISTORY — PX: CHEST TUBE INSERTION: SHX231

## 2021-11-06 SURGERY — INSERTION, PLEURAL DRAINAGE CATHETER
Anesthesia: LOCAL

## 2021-11-06 NOTE — Interval H&P Note (Signed)
History and Physical Interval Note:  11/06/2021 12:02 PM  Adam [name]  has presented today for surgery, with the diagnosis of PLEURAL EFFUSION.  The various methods of treatment have been discussed with the patient and family. After consideration of risks, benefits and other options for treatment, the patient has consented to  Procedure(s): INSERTION PLEURAL DRAINAGE CATHETER (N/A) as a surgical intervention.  The patient's history has been reviewed, patient examined, no change in status, stable for surgery.  I have reviewed the patient's chart and labs.  Questions were answered to the patient's satisfaction.     [place]

## 2021-11-06 NOTE — Progress Notes (Signed)
Following CXR, another 1059mL dark red fluid drained from pleurx per Dr. Valeta Harms.  Pt discharged home with family member.  Vista Lawman, RN

## 2021-11-06 NOTE — Op Note (Signed)
Right Sided PleurX Insertion Procedure Note  IGNAZIO KINCAID  [id number]  May 10, 1955  Date:11/06/21  Time:12:03 PM   Provider Performing:Lavonte Palos L Merita Hawks  Procedure: PleurX Tunneled Pleural Catheter Placement (74259)  Indication(s) Relief of dyspnea from recurrent effusion  Consent Risks of the procedure as well as the alternatives and risks of each were explained to the patient and/or caregiver.  Consent for the procedure was obtained.  Anesthesia Topical only with 1% lidocaine   Time Out Verified patient identification, verified procedure, site/side was marked, verified correct patient position, special equipment/implants available, medications/allergies/relevant history reviewed, required imaging and test results available.  Sterile Technique Maximal sterile technique including sterile barrier drape, hand hygiene, sterile gown, sterile gloves, mask, hair covering.  Procedure Description Ultrasound used to identify appropriate pleural anatomy for placement and overlying skin marked.  Area of drainage cleaned and draped in sterile fashion.   Lidocaine was used to anesthetize the skin and subcutaneous tissue.   1.5 cm incision made overlying fluid and another about 5 cm anterior to this along chest wall.  PleurX catheter inserted in usual sterile fashion using modified seldinger technique.  Interrupted silk sutures placed at catheter insertion and tunneling points which will be removed at later date.  PleurX catheter then hooked to suction.  After fluid aspirated, pleurX capped and sterile dressing applied.  Complications/Tolerance None; patient tolerated the procedure well. Chest X-ray is ordered to confirm no post-procedural complication.   EBL Minimal   Specimen(s) none  Garner Nash, DO [facility] 11/06/2021 12:05 PM

## 2021-11-08 ENCOUNTER — Encounter (HOSPITAL_COMMUNITY): Payer: Self-pay | Admitting: Pulmonary Disease

## 2021-11-08 MED FILL — Dexamethasone Sodium Phosphate Inj 100 MG/10ML: INTRAMUSCULAR | Qty: 1 | Status: AC

## 2021-11-08 NOTE — Progress Notes (Unsigned)
[place] OFFICE PROGRESS NOTE  de [place], [name], MD [address] [place] [address]  DIAGNOSIS:  Metastatic small cell lung cancer initially diagnosed as Limited stage (T1c, N2, M0) small cell lung cancer diagnosed in February 2022.  PRIOR THERAPY: 1) Systemic chemotherapy according to phase 3 randomized clinical trial of chemoradiation versus chemoradiation plus atezolizumab status post 3 cycles.  This treatment was discontinued secondary to intolerance. 2) palliative radiotherapy to the left hip area under the care of Dr. Tammi Klippel completed January 03, 2021. 3) Second line systemic chemotherapy with carboplatin for AUC of 5 on day 1, etoposide 100 Mg/M2 on days 1, 2 and 3 as well as Imfinzi 1500 Mg IV on day 1 and Cosela 240 Mg/M2 before chemotherapy on days 1, 2 and 3 every 3 weeks.  First dose December 13, 2020.  Status post 5 cycles.  Starting from cycle #5 the patient will be on maintenance treatment with Imfinzi every 4 weeks.  This was discontinued secondary to disease progression 4)Third line systemic chemotherapy with Zepzelca (lurbinectedin) 3.2 Mg/M2 IV every 3 weeks.  First dose 04/19/2021.  Status post 6 cycles.  The dose of Zepzelca (lurbinectedin) was reduced to 2.6 Mg/M2 starting from cycle #3  CURRENT THERAPY: Palliative systemic chemotherapy with Irinotecan on days 1 and 8 IV every 3 weeks.  First dose expected on 11/01/2021. Status post 1 cycle day 1.   INTERVAL HISTORY: Adam [name] 66 y.o. male returns to the clinic today for a follow-up visit. Unfortunately, he recently had evidence of disease progression. Therefore, he started chemotherapy with irinotecan. He tolerated it fairly well.  Since last being seen, the patient received 2 units of blood in the clinic for anemia.  He reports he has felt better since then. He also had a Pleurx catheter placed palliatively for his recurrent pleural effusions.  They reports 900 mLs of drainage on the 31st, 850  yesterday, and 700 this am.  He reports he is breathing better after the Pleurx placement. However, he does report dyspnea if moving too much. He denies coughing frequently but notes an occasional cough but "not much". Denies hemoptysis and chest pain. Reports pain in his back where the Pleurex is located. Denies nausea, vomiting, diarrhea, abdominal pain. He notes his appetite is improved but notes noticing he is losing some weight. He is interested in refilling the Remeron. The patient denies any fever, chills, or night sweats.  Denies headaches or vision changes. Denies rashes.      MEDICAL HISTORY: Past Medical History:  Diagnosis Date   Hypertension    Lung cancer ([place])    small cell RUL    ALLERGIES:  has No Known Allergies.  MEDICATIONS:  Current Outpatient Medications  Medication Sig Dispense Refill   acetaminophen (TYLENOL) 650 MG CR tablet Take 650 mg by mouth every 8 (eight) hours as needed for pain.     bisacodyl (DULCOLAX) 5 MG EC tablet Take 5 mg by mouth daily as needed for moderate constipation.     lidocaine-prilocaine (EMLA) cream Apply 1 application topically as needed. (Patient taking differently: Apply 1 application  topically as needed (to access port).) 30 g 2   mirtazapine (REMERON) 15 MG tablet Take 2 tablets (30 mg total) by mouth at bedtime. (Patient not taking: Reported on 11/02/2021) 30 tablet 2   potassium chloride SA (KLOR-CON M) 20 MEQ tablet Take 1 tablet (20 mEq total) by mouth daily. 7 tablet 0   No current facility-administered medications  for this visit.    SURGICAL HISTORY:  Past Surgical History:  Procedure Laterality Date   BRONCHIAL BRUSHINGS  02/08/2020   Procedure: BRONCHIAL BRUSHINGS;  Surgeon: Freddi Starr, MD;  Location: [facility] ENDOSCOPY;  Service: Pulmonary;;   BRONCHIAL NEEDLE ASPIRATION BIOPSY  02/08/2020   Procedure: BRONCHIAL NEEDLE ASPIRATION BIOPSIES;  Surgeon: Freddi Starr, MD;  Location: [place] ENDOSCOPY;  Service:  Pulmonary;;   CHEST TUBE INSERTION N/A 11/06/2021   Procedure: INSERTION PLEURAL DRAINAGE CATHETER;  Surgeon: Garner Nash, DO;  Location: [facility];  Service: Pulmonary;  Laterality: N/A;   INCISION AND DRAINAGE ABSCESS Left 05/31/2021   Procedure: Left Hip Excision;  Surgeon: Michael Boston, MD;  Location: [facility] ORS;  Service: General;  Laterality: Left;   IR IMAGING GUIDED PORT INSERTION  12/30/2020   IR IMAGING GUIDED PORT INSERTION  07/19/2021   IR REMOVAL TUN ACCESS W/ PORT W/O FL MOD SED  05/26/2021   KNEE SURGERY Right    SHOULDER SURGERY Left    VIDEO BRONCHOSCOPY WITH ENDOBRONCHIAL ULTRASOUND N/A 02/08/2020   Procedure: VIDEO BRONCHOSCOPY WITH ENDOBRONCHIAL ULTRASOUND;  Surgeon: Freddi Starr, MD;  Location: [place];  Service: Pulmonary;  Laterality: N/A;    REVIEW OF SYSTEMS:   Review of Systems  Constitutional: Positive for fatigue and weight loss. Negative for chills, and fever.  HENT: Negative for mouth sores, nosebleeds, sore throat and trouble swallowing.   Eyes: Negative for eye problems and icterus.  Respiratory: Positive for dyspnea. Negative for cough, hemoptysis, and wheezing.   Cardiovascular: Negative for chest pain and leg swelling.  Gastrointestinal: Negative for abdominal pain, constipation, diarrhea, nausea and vomiting.  Genitourinary: Negative for bladder incontinence, difficulty urinating, dysuria, frequency and hematuria.   Musculoskeletal: Negative for back pain, gait problem, neck pain and neck stiffness.  Skin: Negative for itching and rash.  Neurological: Negative for dizziness, extremity weakness, gait problem, headaches, light-headedness and seizures.  Hematological: Negative for adenopathy. Does not bruise/bleed easily.  Psychiatric/Behavioral: Negative for confusion, depression and sleep disturbance. The patient is not nervous/anxious.        PHYSICAL EXAMINATION:  There were no vitals taken for this visit.  ECOG PERFORMANCE STATUS:  2-3  Physical Exam  Constitutional: Oriented to person, place, and time and chronically ill appearing male/cachetic appearing, and in no distress.  HENT:  Head: Normocephalic and atraumatic.  Mouth/Throat: Oropharynx is clear and moist. No oropharyngeal exudate.  Eyes: Conjunctivae are normal. Right eye exhibits no discharge. Left eye exhibits no discharge. No scleral icterus.  Neck: Normal range of motion. Neck supple.  Cardiovascular: Increased rate, regular rhythm, normal heart sounds and intact distal pulses.   Pulmonary/Chest: Right sided rales with decreased breath sounds in the RLL. No respiratory distress. No wheezes.  Abdominal: Soft. Exhibits no distension. There is no tenderness.  Musculoskeletal: In wheelchair. Exhibits no edema.  Lymphadenopathy:    No cervical adenopathy.  Neurological: Alert and oriented to person, place, and time. Exhibits normal muscle tone. In wheelchair.  Skin: Skin is warm and dry. No rash noted. Not diaphoretic. No erythema. No pallor.  Psychiatric: Mood, memory and judgment normal.  Vitals reviewed.  LABORATORY DATA: Lab Results  Component Value Date   WBC 8.8 11/01/2021   HGB 7.7 (L) 11/01/2021   HCT 24.6 (L) 11/01/2021   MCV 92.8 11/01/2021   PLT 285 11/01/2021      Chemistry      Component Value Date/Time   NA 139 11/01/2021 1309   K 3.8 11/01/2021 1309  CL 103 11/01/2021 1309   CO2 26 11/01/2021 1309   BUN 36 (H) 11/01/2021 1309   CREATININE 1.01 11/01/2021 1309      Component Value Date/Time   CALCIUM 9.7 11/01/2021 1309   ALKPHOS 67 11/01/2021 1309   AST 36 11/01/2021 1309   ALT 39 11/01/2021 1309   BILITOT 0.4 11/01/2021 1309       RADIOGRAPHIC STUDIES:  DG Chest Port 1 View  Result Date: 11/06/2021 CLINICAL DATA:  Congestive heart failure. EXAM: PORTABLE CHEST 1 VIEW COMPARISON:  October 24, 2021. FINDINGS: There is now noted complete opacification of the right hemithorax most likely due to pleural effusion and  some degree of atelectasis. Pleural drainage catheter is noted in the right lung base. Right internal jugular Port-A-Cath is unchanged in position. Mediastinal shift to the left is noted. Left lung is clear. IMPRESSION: Complete opacification of the right hemithorax is now noted due to pleural effusion and some degree of atelectasis. Pleural drainage catheter is noted in right lung base. Electronically Signed   By: Marijo Conception M.D.   On: 11/06/2021 13:43   CT CHEST ABDOMEN PELVIS W CONTRAST  Addendum Date: 11/02/2021   ADDENDUM REPORT: 11/02/2021 14:55 ADDENDUM: Addendum for RECIST measurements per provided worksheet: Lung, right apex, spiculated mass: 0 mm Lung, mediastinum, right hilar mass: 0 mm Mediastinal pretracheal lymph nodes: 0 mm Electronically Signed   By: Delanna Ahmadi M.D.   On: 11/02/2021 14:55   Result Date: 11/02/2021 CLINICAL DATA:  Small-cell lung cancer, assess treatment response * Tracking Code: BO * EXAM: CT CHEST, ABDOMEN, AND PELVIS WITH CONTRAST TECHNIQUE: Multidetector CT imaging of the chest, abdomen and pelvis was performed following the standard protocol during bolus administration of intravenous contrast. RADIATION DOSE REDUCTION: This exam was performed according to the departmental dose-optimization program which includes automated exposure control, adjustment of the mA and/or kV according to patient size and/or use of iterative reconstruction technique. CONTRAST:  1102mL ISOVUE-300 IOPAMIDOL (ISOVUE-300) INJECTION 61% additional oral enteric contrast COMPARISON:  07/26/2021 FINDINGS: CT CHEST FINDINGS Cardiovascular: Right chest port catheter. Aortic atherosclerosis. Normal heart size. Three-vessel coronary artery calcifications. No pericardial effusion. Relatively expansile appearance of the right brachial and axillary veins, with soft tissue stranding in the right axilla (series 2, image 8). Mediastinum/Nodes: Significant interval increase in lower cervical, superior  mediastinal and periaortic lymphadenopathy paraesophageal lymph node or conglomerate mass measures 4.3 x 4.3 cm, previously 3.4 x 2.7 cm (series 2, image 28). Newly enlarged index left superior mediastinal/lower cervical lymph node measures 2.3 x 1.9 cm (series 2, image 7). Thyroid gland, trachea, and esophagus demonstrate no significant findings. Lungs/Pleura: Significant interval increase in a large right pleural effusion, with near total atelectasis of the right lung on today's examination. Treated mass of the posterior and apical right upper lobe is almost completely obscured by atelectasis although not obviously changed (series 2, image 15). Extensive, new and enlarged right-sided pleural thickening and nodularity with numerous new pleural nodules about the right lung base. A large nodule of the peripheral right upper lobe is significantly increased in size, measuring 3.9 x 3.0 cm, previously 2.5 x 1.1 cm (series 2, image 18). Index new nodule of the anterior inferior right lung base measures 3.3 x 1.4 cm (series 2, image 47). Volume loss of the left hemithorax secondary to compression and mediastinal shift. Moderate, predominantly paraseptal emphysema of the left lung with mild fibrotic change of the left lung characterized by varicoid bronchiectasis, irregular interstitial opacity, ground-glass, unchanged (series  6, image 122). Musculoskeletal: No chest wall abnormality. No acute osseous findings. CT ABDOMEN PELVIS FINDINGS Hepatobiliary: No solid liver abnormality is seen. Stable, benign small cysts or hemangiomata of the liver for which no further follow-up or characterization is required. No gallstones, gallbladder wall thickening, or biliary dilatation. Pancreas: Unremarkable. No pancreatic ductal dilatation or surrounding inflammatory changes. Spleen: Normal in size without significant abnormality. Unchanged, benign cyst of the inferior spleen, for which no further follow-up or characterization is  required (series 2, image 66). Adrenals/Urinary Tract: Adrenal glands are unremarkable. Nonobstructive left renal calculus. No right-sided calculi, ureteral calculi, or hydronephrosis. Bladder is unremarkable. Stomach/Bowel: Stomach is within normal limits. Appendix appears normal. No evidence of bowel wall thickening, distention, or inflammatory changes. Vascular/Lymphatic: Aortic atherosclerosis. Newly enlarged right crural lymph node measuring up to 1.7 x 1.3 cm (series 2, image 66). Reproductive: No mass or other abnormality. Other: Small, fat containing left inguinal hernia.  No ascites. Musculoskeletal: No acute osseous findings. IMPRESSION: 1. Significant interval increase in a large right pleural effusion, with near total atelectasis of the right lung on today's examination. Extensive, new and enlarged pleural thickening and nodularity throughout the right hemithorax. 2. Treated mass of the posterior and apical right upper lobe is almost completely obscured by atelectasis although not obviously changed. 3. Significant interval increase in lower cervical, superior mediastinal, and periaortic lymphadenopathy as well as a newly enlarged right crural lymph node in the included upper abdomen. 4. Constellation of findings is consistent with worsened pleural and nodal metastatic disease in the setting of primary lung malignancy. 5. For the purposes of RECIST reporting, initially identified target lesion of the right upper lobe is not clearly appreciated due to consolidation and atelectasis and initially identified target lymph node in the pretracheal nodal station is not noted. 6. Relatively expansile appearance of the right brachial and axillary veins, with soft tissue stranding in the right axilla, suspicious for deep venous thrombosis. Consider right upper extremity DVT ultrasound if indicated by clinical concern. 7. Coronary artery disease. 8. Nonobstructive left nephrolithiasis. Aortic Atherosclerosis  (ICD10-I70.0) and Emphysema (ICD10-J43.9). Electronically Signed: By: Delanna Ahmadi M.D. On: 10/19/2021 11:16   US Thoracentesis Asp Pleural space w/IMG guide  Result Date: 10/24/2021 INDICATION: Lung cancer with large right pleural effusion. Request therapeutic thoracentesis EXAM: ULTRASOUND GUIDED RIGHT THORACENTESIS MEDICATIONS: 1% lidocaine 10 mL COMPLICATIONS: None immediate. PROCEDURE: An ultrasound guided thoracentesis was thoroughly discussed with the patient and questions answered. The benefits, risks, alternatives and complications were also discussed. The patient understands and wishes to proceed with the procedure. Written consent was obtained. Ultrasound was performed to localize and mark an adequate pocket of fluid in the right chest. The area was then prepped and draped in the normal sterile fashion. 1% Lidocaine was used for local anesthesia. Under ultrasound guidance a 6 Fr Safe-T-Centesis catheter was introduced. Thoracentesis was performed. The catheter was removed and a dressing applied. FINDINGS: A total of approximately 2.2 L of thin red fluid was removed. IMPRESSION: Successful ultrasound guided right thoracentesis yielding 2.2 L of pleural fluid. Possible small ex vacuo type pneumothorax seen on post-procedure chest x-ray. Patient is asymptomatic. Procedure performed by: Gareth Eagle, PA-C Electronically Signed   By: Aletta Edouard M.D.   On: 10/24/2021 16:16   DG Chest 1 View  Result Date: 10/24/2021 CLINICAL DATA:  Post thoracentesis with 2.2 L removed from the RIGHT chest. EXAM: CHEST  1 VIEW COMPARISON:  October 18, 2021 FINDINGS: RIGHT-sided Port-A-Cath terminates in the mid SVC. Cardiomediastinal contours  and hilar structures grossly stable and largely obscured in the RIGHT chest due to persistent large RIGHT-sided pleural effusion which appears to have diminished following thoracentesis. Potential small pneumothorax over the RIGHT upper chest mixed with pleural fluid and  pleural disease. LEFT chest is clear. On limited assessment no acute skeletal process. IMPRESSION: 1. Persistent large RIGHT-sided pleural effusion which appears to have diminished following thoracentesis. Potential small pneumothorax over the RIGHT upper chest mixed with pleural fluid and pleural disease. Could consider further evaluation with expiratory view as warranted. If present this likely represents a small ex vacuo type pneumothorax. 2. LEFT chest is clear. These results were called by telephone at the time of interpretation on 10/24/2021 at 3:28 pm to provider [facility] , who verbally acknowledged these results. Electronically Signed   By: Zetta Bills M.D.   On: 10/24/2021 15:28   US Thoracentesis Asp Pleural space w/IMG guide  Result Date: 10/19/2021 INDICATION: Patient with a history of metastatic small cell lung cancer presents today with large right pleural effusion. Interventional radiology asked to perform a therapeutic thoracentesis. EXAM: ULTRASOUND GUIDED THORACENTESIS MEDICATIONS: 1% lidocaine 10 mL COMPLICATIONS: None immediate. PROCEDURE: An ultrasound guided thoracentesis was thoroughly discussed with the patient and questions answered. The benefits, risks, alternatives and complications were also discussed. The patient understands and wishes to proceed with the procedure. Written consent was obtained. Ultrasound was performed to localize and mark an adequate pocket of fluid in the right chest. The area was then prepped and draped in the normal sterile fashion. 1% Lidocaine was used for local anesthesia. Under ultrasound guidance a 6 Fr Safe-T-Centesis catheter was introduced. Thoracentesis was performed. The catheter was removed and a dressing applied. FINDINGS: A total of approximately 1 L of blood-tinged fluid was removed. IMPRESSION: Successful ultrasound guided right thoracentesis yielding 1 L of pleural fluid. Read by: Soyla Dryer, NP Electronically Signed   By: Corrie Mckusick D.O.   On: 10/19/2021 15:38   DG Chest 1 View  Result Date: 10/19/2021 CLINICAL DATA:  Status post thoracentesis. EXAM: CHEST  1 VIEW COMPARISON:  Chest radiograph 05/17/2021, CT chest 1 day prior. FINDINGS: The right chest wall port is stable. The cardiomediastinal silhouette is not well assessed. There is near-complete opacification of the right hemithorax with a small amount of aerated lung in the right apex likely reflecting a persistent large right pleural effusion. There is apparent persistent leftward displacement of the heart. There is no appreciable pneumothorax The left hemithorax is clear. There is no left pleural effusion or pneumothorax There is no acute osseous abnormality. IMPRESSION: Persistent large right pleural effusion with near-complete opacification of the right hemithorax and a small amount of aerated lung in the right apex, and persistent leftward displacement of the heart. No appreciable pneumothorax. Electronically Signed   By: Valetta Mole M.D.   On: 10/19/2021 15:18     ASSESSMENT/PLAN:  This is a very pleasant 66 year old African-American male who was initially diagnosed with limited stage small cell lung cancer in February 2022. The patient had evidence of disease recurrence and extensive stage small cell lung cancer and resumed treatment in December 2022   The patient initially underwent treatment with NRG [id number] clinical trial where the patient would be randomized to treatment with concurrent chemoradiation with cisplatin and/or etoposide versus concurrent chemoradiation with the same regimen in addition to atezolizumab. He is status post 3 cycles of the treatment.  His treatment was discontinued secondary to intolerance and the patient had been in observation from  April 2022-December 2022.   Repeat CT scan of the chest, abdomen, pelvis showed evidence of disease progression with a new mass centered in the superior aspect of the right major fissure, widespread  nodularity in the adjacent portion of the upper right lung, multiple new pleural-based lesions in the right hemithorax, new middle mediastinal/lymphadenopathy, new hypervascular lesion in the proximal body of the pancreas and a new lytic lesions in the parasymphyseal region of the left to pubic bone.   His insurance declined a staging PET scan.   Therefore, Dr. Julien Nordmann started the patient on palliative systemic chemotherapy with carboplatin for an AUC of 5 on day 1, etoposide 100 mg per metered squared on days 1, 2, and 3 with Cosela for myelosuppression in addition to immunotherapy with Imfinzi 1500 mg.  He status post 5 cycles.  He tolerated it fairly well except he had decreased p.o. intake and weight loss.  Starting from cycle #5, the patient started maintenance immunotherapy with Imfinzi 1500 mg IV every 4 weeks.This was discontinued due to disease progression.    The patient also completed palliative radiation to the left hip under the care of Dr. Tammi Klippel.  This was completed on 01/03/2021   The patient had a restaging CT scan performed in April 2023.  The scan showed evidence of disease progression with interval enlargement of a bulky low left hilar or periaortic lymph node or soft tissue mass, as well as smaller lymph nodes within the superior mediastinum. There was also interval enlargement of multiple right-sided masses and slightly increased right pleural effusion  The patient is currently undergoing a third line treatment with Zepzelca (lurbinectedin) 3.2 Mg/M2 every 3 weeks status post 6 cycles.  He was admitted to the hospital after cycle #2 with significant chemotherapy-induced pancytopenia and neutropenic fever as well as left hip subcutaneous abscess.  The patient had been tolerating his treatment with Zepzelca (lurbinectedin) fairly well with no concerning adverse effect except for fatigue.   He then had a restaging CT scan in October 2023.  Unfortunately, the scan showed evidence  of disease progression.   Therefore, he is being started Irinotecan days 1 and 8 IV every 3 weeks on 11/01/21. He tolerated it well.    Labs were reviewed. Recommend that he continue with cycle #1 day 8 as scheduled.   I have refilled his remeron for his poor appetite.   He will continue to drain his pleurex catheter at home.   We will see him in 2 weeks for evaluation and repeat blood work before starting day 1 cycle #2.   The patient was advised to call immediately if she has any concerning symptoms in the interval. The patient voices understanding of current disease status and treatment options and is in agreement with the current care plan. All questions were answered. The patient knows to call the clinic with any problems, questions or concerns. We can certainly see the patient much sooner if necessary       No orders of the defined types were placed in this encounter.    The total time spent in the appointment was 20-29 minutes.   Ceclia Koker L [place]am [place], PA-C 11/08/21

## 2021-11-09 ENCOUNTER — Inpatient Hospital Stay: Payer: Commercial Managed Care - HMO | Attending: Hematology and Oncology

## 2021-11-09 ENCOUNTER — Inpatient Hospital Stay: Payer: Commercial Managed Care - HMO

## 2021-11-09 ENCOUNTER — Inpatient Hospital Stay: Payer: Commercial Managed Care - HMO | Admitting: Nutrition

## 2021-11-09 ENCOUNTER — Inpatient Hospital Stay: Payer: Commercial Managed Care - HMO | Admitting: Emergency Medicine

## 2021-11-09 ENCOUNTER — Other Ambulatory Visit: Payer: Self-pay

## 2021-11-09 ENCOUNTER — Inpatient Hospital Stay: Payer: Commercial Managed Care - HMO | Admitting: Internal Medicine

## 2021-11-09 ENCOUNTER — Encounter: Payer: Self-pay | Admitting: Nurse Practitioner

## 2021-11-09 ENCOUNTER — Inpatient Hospital Stay: Payer: Commercial Managed Care - HMO | Admitting: Physician Assistant

## 2021-11-09 ENCOUNTER — Inpatient Hospital Stay (HOSPITAL_BASED_OUTPATIENT_CLINIC_OR_DEPARTMENT_OTHER): Payer: Commercial Managed Care - HMO | Admitting: Nurse Practitioner

## 2021-11-09 VITALS — BP 100/66 | HR 114 | Temp 97.9°F | Resp 18 | Wt 158.1 lb

## 2021-11-09 VITALS — HR 98 | Resp 18

## 2021-11-09 DIAGNOSIS — Z79899 Other long term (current) drug therapy: Secondary | ICD-10-CM | POA: Insufficient documentation

## 2021-11-09 DIAGNOSIS — J90 Pleural effusion, not elsewhere classified: Secondary | ICD-10-CM | POA: Diagnosis not present

## 2021-11-09 DIAGNOSIS — Z5111 Encounter for antineoplastic chemotherapy: Secondary | ICD-10-CM

## 2021-11-09 DIAGNOSIS — Z7189 Other specified counseling: Secondary | ICD-10-CM

## 2021-11-09 DIAGNOSIS — C3491 Malignant neoplasm of unspecified part of right bronchus or lung: Secondary | ICD-10-CM

## 2021-11-09 DIAGNOSIS — K4090 Unilateral inguinal hernia, without obstruction or gangrene, not specified as recurrent: Secondary | ICD-10-CM | POA: Diagnosis not present

## 2021-11-09 DIAGNOSIS — R530 Neoplastic (malignant) related fatigue: Secondary | ICD-10-CM

## 2021-11-09 DIAGNOSIS — Z515 Encounter for palliative care: Secondary | ICD-10-CM

## 2021-11-09 DIAGNOSIS — R000 Tachycardia, unspecified: Secondary | ICD-10-CM | POA: Insufficient documentation

## 2021-11-09 DIAGNOSIS — R630 Anorexia: Secondary | ICD-10-CM

## 2021-11-09 DIAGNOSIS — E860 Dehydration: Secondary | ICD-10-CM | POA: Diagnosis not present

## 2021-11-09 DIAGNOSIS — N200 Calculus of kidney: Secondary | ICD-10-CM | POA: Diagnosis not present

## 2021-11-09 DIAGNOSIS — C3411 Malignant neoplasm of upper lobe, right bronchus or lung: Secondary | ICD-10-CM | POA: Insufficient documentation

## 2021-11-09 DIAGNOSIS — Z923 Personal history of irradiation: Secondary | ICD-10-CM | POA: Insufficient documentation

## 2021-11-09 DIAGNOSIS — R634 Abnormal weight loss: Secondary | ICD-10-CM | POA: Diagnosis not present

## 2021-11-09 DIAGNOSIS — I2510 Atherosclerotic heart disease of native coronary artery without angina pectoris: Secondary | ICD-10-CM | POA: Insufficient documentation

## 2021-11-09 DIAGNOSIS — I700 Atherosclerosis of aorta: Secondary | ICD-10-CM | POA: Diagnosis not present

## 2021-11-09 DIAGNOSIS — I10 Essential (primary) hypertension: Secondary | ICD-10-CM | POA: Diagnosis not present

## 2021-11-09 DIAGNOSIS — C7951 Secondary malignant neoplasm of bone: Secondary | ICD-10-CM | POA: Insufficient documentation

## 2021-11-09 DIAGNOSIS — Z95828 Presence of other vascular implants and grafts: Secondary | ICD-10-CM

## 2021-11-09 LAB — CMP (CANCER CENTER ONLY)
ALT: 22 U/L (ref 0–44)
AST: 18 U/L (ref 15–41)
Albumin: 2.9 g/dL — ABNORMAL LOW (ref 3.5–5.0)
Alkaline Phosphatase: 55 U/L (ref 38–126)
Anion gap: 7 (ref 5–15)
BUN: 13 mg/dL (ref 8–23)
CO2: 29 mmol/L (ref 22–32)
Calcium: 9.5 mg/dL (ref 8.9–10.3)
Chloride: 103 mmol/L (ref 98–111)
Creatinine: 0.69 mg/dL (ref 0.61–1.24)
GFR, Estimated: >60 mL/min (ref >60)
Glucose, Bld: 94 mg/dL (ref 70–99)
Potassium: 4.3 mmol/L (ref 3.5–5.1)
Sodium: 139 mmol/L (ref 135–145)
Total Bilirubin: 0.4 mg/dL (ref 0.3–1.2)
Total Protein: 6.7 g/dL (ref 6.5–8.1)

## 2021-11-09 LAB — SAMPLE TO BLOOD BANK

## 2021-11-09 LAB — CBC WITH DIFFERENTIAL (CANCER CENTER ONLY)
Abs Immature Granulocytes: 0.04 10*3/uL (ref 0.00–0.07)
Basophils Absolute: 0 10*3/uL (ref 0.0–0.1)
Basophils Relative: 0 %
Eosinophils Absolute: 0.2 10*3/uL (ref 0.0–0.5)
Eosinophils Relative: 4 %
HCT: 30.6 % — ABNORMAL LOW (ref 39.0–52.0)
Hemoglobin: 9.9 g/dL — ABNORMAL LOW (ref 13.0–17.0)
Immature Granulocytes: 1 %
Lymphocytes Relative: 11 %
Lymphs Abs: 0.5 10*3/uL — ABNORMAL LOW (ref 0.7–4.0)
MCH: 30.2 pg (ref 26.0–34.0)
MCHC: 32.4 g/dL (ref 30.0–36.0)
MCV: 93.3 fL (ref 80.0–100.0)
Monocytes Absolute: 0.5 10*3/uL (ref 0.1–1.0)
Monocytes Relative: 10 %
Neutro Abs: 3.5 10*3/uL (ref 1.7–7.7)
Neutrophils Relative %: 74 %
Platelet Count: 176 10*3/uL (ref 150–400)
RBC: 3.28 MIL/uL — ABNORMAL LOW (ref 4.22–5.81)
RDW: 18.1 % — ABNORMAL HIGH (ref 11.5–15.5)
WBC Count: 4.7 10*3/uL (ref 4.0–10.5)
nRBC: 0 % (ref 0.0–0.2)

## 2021-11-09 MED ORDER — SODIUM CHLORIDE 0.9% FLUSH
10.0000 mL | Freq: Once | INTRAVENOUS | Status: AC
  Administered 2021-11-09: 10 mL

## 2021-11-09 MED ORDER — ATROPINE SULFATE 1 MG/ML IV SOLN
0.5000 mg | Freq: Once | INTRAVENOUS | Status: AC | PRN
  Administered 2021-11-09: 0.5 mg via INTRAVENOUS
  Filled 2021-11-09: qty 1

## 2021-11-09 MED ORDER — PALONOSETRON HCL INJECTION 0.25 MG/5ML
0.2500 mg | Freq: Once | INTRAVENOUS | Status: AC
  Administered 2021-11-09: 0.25 mg via INTRAVENOUS
  Filled 2021-11-09: qty 5

## 2021-11-09 MED ORDER — SODIUM CHLORIDE 0.9% FLUSH
10.0000 mL | INTRAVENOUS | Status: DC | PRN
  Administered 2021-11-09: 10 mL

## 2021-11-09 MED ORDER — SODIUM CHLORIDE 0.9 % IV SOLN
65.0000 mg/m2 | Freq: Once | INTRAVENOUS | Status: AC
  Administered 2021-11-09: 120 mg via INTRAVENOUS
  Filled 2021-11-09: qty 6

## 2021-11-09 MED ORDER — SODIUM CHLORIDE 0.9 % IV SOLN
10.0000 mg | Freq: Once | INTRAVENOUS | Status: AC
  Administered 2021-11-09: 10 mg via INTRAVENOUS
  Filled 2021-11-09: qty 10

## 2021-11-09 MED ORDER — SODIUM CHLORIDE 0.9 % IV SOLN: Freq: Once | INTRAVENOUS | Status: AC

## 2021-11-09 MED ORDER — HEPARIN SOD (PORK) LOCK FLUSH 100 UNIT/ML IV SOLN
500.0000 [IU] | Freq: Once | INTRAVENOUS | Status: AC | PRN
  Administered 2021-11-09: 500 [IU]

## 2021-11-09 MED ORDER — MIRTAZAPINE 15 MG PO TABS: 30.0000 mg | ORAL_TABLET | Freq: Every day | ORAL | 2 refills | Status: AC

## 2021-11-09 NOTE — Progress Notes (Signed)
[facility]  [phone] [phone]) [phone]   Name: Adam [name] Date: 11/09/2021 MRN: [id number]  DOB: Sep 05, 1955  Patient Care Team: [place], Blondell Reveal, MD as PCP - General (Family Medicine) [name], RN (Inactive) as Registered Nurse Curt Bears, MD as Consulting Physician (Oncology)    INTERVAL HISTORY: Adam [name] is a 66 y.o. 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  Palliative ask to see for symptom management and goals of care.   SOCIAL HISTORY:     reports that he quit smoking about 3 years ago. His smoking use included cigarettes. He has a 20.00 pack-year smoking history. He has never used smokeless tobacco. He reports that he does not currently use alcohol. He reports that he does not use drugs.  ADVANCE DIRECTIVES:  Patient does not have a documented advanced directive.  Is interested in completing.  Education provided, advanced directive packet and clinic dates made available.  CODE STATUS: DNR  PAST MEDICAL HISTORY: Past Medical History:  Diagnosis Date   Hypertension    Lung cancer ([facility])    small cell RUL    ALLERGIES:  has No Known Allergies.  MEDICATIONS:  Current Outpatient Medications  Medication Sig Dispense Refill   acetaminophen (TYLENOL) 650 MG CR tablet Take 650 mg by mouth every 8 (eight) hours as needed for pain.     bisacodyl (DULCOLAX) 5 MG EC tablet Take 5 mg by mouth daily as needed for moderate constipation.     lidocaine-prilocaine (EMLA) cream Apply 1 application topically as needed. (Patient taking differently: Apply 1 application  topically as needed (to access port).) 30 g 2   mirtazapine (REMERON) 15 MG tablet Take 2 tablets (30 mg total) by mouth at bedtime. 30 tablet 2   potassium chloride SA (KLOR-CON M) 20 MEQ tablet Take 1 tablet (20  mEq total) by mouth daily. 7 tablet 0   No current facility-administered medications for this visit.   Facility-Administered Medications Ordered in Other Visits  Medication Dose Route Frequency Provider Last Rate Last Admin   sodium chloride flush (NS) 0.9 % injection 10 mL  10 mL Intracatheter PRN Curt Bears, MD   10 mL at 11/09/21 1504    VITAL SIGNS: There were no vitals taken for this visit. There were no vitals filed for this visit.  Estimated body mass index is 23.35 kg/m as calculated from the following:   Height as of 11/06/21: 5\' 9"  (1.753 m).   Weight as of an earlier encounter on 11/09/21: 158 lb 1.6 oz (71.7 kg).   PERFORMANCE STATUS (ECOG) : 0 - Asymptomatic   Physical Exam General: NAD, ambulatory with cane  Pulmonary: normal breathing pattern  Extremities: no edema, no joint deformities Skin: no rashes Neurological: AAO x3, mood appropriate   IMPRESSION: I saw Adam [name] during his infusion today for ongoing follow-up. His ex-wife, is present with him. No acute distress noted. States he is feeling much better since having Pleurx catheter placed. Adam [name] states she is staying with him and assisting. She is draining Pleurx daily. 10/31 (900 ml) 11/1 (820ml) and today 765ml. He denies shortness of breath but is aware when fluid is building back up. He is trying to remain as active as possible. Some increased fatigue.   States he has some discomfort around the catheter insertion site but no other pain. Denies  nausea, vomiting, constipation, or diarrhea.   No symptom management needs at this time.  Appetite continues to be well.  Is trying to drink 1-2 ensures daily.  Weight is down to 158 lbs from 170lbs on 10/30. We will continue to closely monitor.   Adam [name] completed his advanced directives. He presented a copy to me. We have reviewed in detail and a copy has been scanned to his chart. He identified Adam [name] as his primary medical decision-maker and his Sister  Adam [name] is secondary.  He does not wish for his life to be prolonged.  Is open to temporary feeding tube options.  States his healthcare power of attorney must follow his specified living will.  PLAN: Ongoing support.  No symptom management needs at this time. Advanced directive has been completed.  Copy has been scanned to patient's chart.  Original given back to patient and his family. I will plan to see patient back in 4-6 weeks.  He knows to contact our office sooner if needed.   Patient expressed understanding and was in agreement with this plan. He also understands that He can call the clinic at any time with any questions, concerns, or complaints.       Time Total: 35 min   Visit consisted of counseling and education dealing with the complex and emotionally intense issues of symptom management and palliative care in the setting of serious and potentially life-threatening illness.Greater than 50%  of this time was spent counseling and coordinating care related to the above assessment and plan.  Alda Lea, AGPCNP-BC  Palliative Medicine Team/[facility]

## 2021-11-09 NOTE — Progress Notes (Signed)
Nutrition follow-up completed with patient during infusion for metastatic lung cancer. Weight decreased and documented as 158 pounds 1.6 ounces November 2.  This is down from 172 pounds 8 ounces September 21.  Labs include albumin 2.9.  Patient denies nausea vomiting, constipation, and diarrhea.  He reports his appetite is picking up.  He was drinking Ensure plus high-protein 1-2 cartons daily.  States he has almost run out.  Nutrition diagnosis: Inadequate oral intake, ongoing.  Intervention: Encourage patient to increase calories and protein at meals and include 3 snacks daily. Increase oral nutrition supplements of Ensure Plus high-protein or equivalent to 3 cartons daily. Provided 1 complementary case of Ensure Plus high-protein. Provided coupons.  Monitoring, evaluation, goals: Patient will work to increase calories and protein to minimize further weight loss.  Next visit: To be scheduled as needed with upcoming treatment.  **Disclaimer: This note was dictated with voice recognition software. Similar sounding words can inadvertently be transcribed and this note may contain transcription errors which may not have been corrected upon publication of note.**

## 2021-11-09 NOTE — Patient Instructions (Signed)
[name]  Discharge Instructions: Thank you for choosing [facility] to provide your oncology and hematology care.   If you have a lab appointment with the [facility], please go directly to the [facility] and check in at the registration area.   Wear comfortable clothing and clothing appropriate for easy access to any Portacath or PICC line.   We strive to give you quality time with your provider. You may need to reschedule your appointment if you arrive late (15 or more minutes).  Arriving late affects you and other patients whose appointments are after yours.  Also, if you miss three or more appointments without notifying the office, you may be dismissed from the clinic at the provider's discretion.      For prescription refill requests, have your pharmacy contact our office and allow 72 hours for refills to be completed.    Today you received the following chemotherapy and/or immunotherapy agents: Irinotecan      To help prevent nausea and vomiting after your treatment, we encourage you to take your nausea medication as directed.  BELOW ARE SYMPTOMS THAT SHOULD BE REPORTED IMMEDIATELY: *FEVER GREATER THAN 100.4 F (38 C) OR HIGHER *CHILLS OR SWEATING *NAUSEA AND VOMITING THAT IS NOT CONTROLLED WITH YOUR NAUSEA MEDICATION *UNUSUAL SHORTNESS OF BREATH *UNUSUAL BRUISING OR BLEEDING *URINARY PROBLEMS (pain or burning when urinating, or frequent urination) *BOWEL PROBLEMS (unusual diarrhea, constipation, pain near the anus) TENDERNESS IN MOUTH AND THROAT WITH OR WITHOUT PRESENCE OF ULCERS (sore throat, sores in mouth, or a toothache) UNUSUAL RASH, SWELLING OR PAIN  UNUSUAL VAGINAL DISCHARGE OR ITCHING   Items with * indicate a potential emergency and should be followed up as soon as possible or go to the Emergency Department if any problems should occur.  Please show the CHEMOTHERAPY ALERT CARD or IMMUNOTHERAPY ALERT CARD at check-in to  the Emergency Department and triage nurse.  Should you have questions after your visit or need to cancel or reschedule your appointment, please contact [facility]  Dept: [phone]  and follow the prompts.  Office hours are 8:00 a.m. to 4:30 p.m. Monday - Friday. Please note that voicemails left after 4:00 p.m. may not be returned until the following business day.  We are closed weekends and major holidays. You have access to a nurse at all times for urgent questions. Please call the main number to the clinic Dept: [phone] and follow the prompts.   For any non-urgent questions, you may also contact your provider using MyChart. We now offer e-Visits for anyone 67 and older to request care online for non-urgent symptoms. For details visit [email or web address].   Also download the MyChart app! Go to the app store, search "MyChart", open the app, select [facility], and log in with your MyChart username and password.  Masks are optional in the cancer centers. If you would like for your care team to wear a mask while they are taking care of you, please let them know. You may have one support person who is at least 64 years old accompany you for your appointments.

## 2021-11-13 ENCOUNTER — Inpatient Hospital Stay: Payer: Commercial Managed Care - HMO

## 2021-11-13 ENCOUNTER — Inpatient Hospital Stay: Payer: Commercial Managed Care - HMO | Admitting: Emergency Medicine

## 2021-11-13 ENCOUNTER — Inpatient Hospital Stay: Payer: Commercial Managed Care - HMO | Admitting: Nutrition

## 2021-11-13 ENCOUNTER — Encounter: Payer: Commercial Managed Care - HMO | Admitting: Nutrition

## 2021-11-13 ENCOUNTER — Inpatient Hospital Stay: Payer: Commercial Managed Care - HMO | Admitting: Internal Medicine

## 2021-11-13 ENCOUNTER — Telehealth: Payer: Self-pay | Admitting: Internal Medicine

## 2021-11-13 NOTE — Telephone Encounter (Signed)
Called patient regarding upcoming November and December appointments, patient is notified. Patient will receive updated calendar next visit.

## 2021-11-16 ENCOUNTER — Encounter: Payer: Self-pay | Admitting: Nurse Practitioner

## 2021-11-16 ENCOUNTER — Ambulatory Visit (INDEPENDENT_AMBULATORY_CARE_PROVIDER_SITE_OTHER): Payer: Commercial Managed Care - HMO | Admitting: Nurse Practitioner

## 2021-11-16 ENCOUNTER — Telehealth: Payer: Self-pay | Admitting: *Deleted

## 2021-11-16 VITALS — BP 102/68 | HR 116 | Temp 98.0°F | Ht 69.0 in | Wt 154.6 lb

## 2021-11-16 DIAGNOSIS — J910 Malignant pleural effusion: Secondary | ICD-10-CM

## 2021-11-16 DIAGNOSIS — C3491 Malignant neoplasm of unspecified part of right bronchus or lung: Secondary | ICD-10-CM | POA: Diagnosis not present

## 2021-11-16 NOTE — Progress Notes (Signed)
_0  ID: Adam [name], male    DOB: 1955/12/19, 66 y.o.   MRN: [id number]  Chief Complaint  Patient presents with   Follow-up    Catheter placed on 11/07/2021. Occass. Dry cough, denies sob.    Referring provider: [place], Blondell Reveal, MD  HPI: 66 year old male, former smoker followed for malignant pleural effusion.  He has a history of small cell lung cancer diagnosed 02/2020, treated with chemoradiation.  He has had recurrent/progressive disease, despite even third line therapy.  Most recent course was complicated by large right pleural effusion requiring serial thoracenteses.  He is a patient of Dr. Agustina Caroli and last seen in office 11/02/2021.  Past medical history significant for hypertension, chemo induced neutropenia, inguinal hernia.  TEST/EVENTS:  03/09/2020 PFT: FVC 91, FEV1 91, ratio 78, TLC 87, DLCOcor 60. No BD  11/02/2021: OV with Dr. Lamonte Sakai.  Large right pleural effusion with associated dyspnea.  He is failing chemotherapy (third line regimen).  Reasonable to offer Pleurx drainage catheter for dyspnea control, palliation.  Agreed with procedure; set up 10/30 with Dr. Valeta Harms.  11/16/2021: Today-follow-up Patient presents today for follow-up after having Pleurx drain placed by Dr. Valeta Harms on 10/30. He has recovered well since. Breathing feels better; still gets short winded with longer. Minimal dry cough, which is improved. Denies fevers, chills, increased chest congestion. His ex-wife has been assisting him at home and is his caregiver. She has excellent knowledge surrounding proper care of device and drainage. She has been draining it daily, getting between 250-850 mL's out. Last time they drained it was this morning; 500 mL. They did use the last drainage kit this morning. Otherwise, no concerns or complaints today.   No Known Allergies   There is no immunization history on file for this patient.  Past Medical History:  Diagnosis Date   Hypertension    Lung cancer ([place])     small cell RUL    Tobacco History: Social History   Tobacco Use  Smoking Status Former   Packs/day: 1.00   Years: 20.00   Total pack years: 20.00   Types: Cigarettes   Quit date: 06/09/2018   Years since quitting: 3.4  Smokeless Tobacco Never   Counseling given: Not Answered   Outpatient Medications Prior to Visit  Medication Sig Dispense Refill   acetaminophen (TYLENOL) 650 MG CR tablet Take 650 mg by mouth every 8 (eight) hours as needed for pain.     bisacodyl (DULCOLAX) 5 MG EC tablet Take 5 mg by mouth daily as needed for moderate constipation.     lidocaine-prilocaine (EMLA) cream Apply 1 application topically as needed. (Patient taking differently: Apply 1 application  topically as needed (to access port).) 30 g 2   mirtazapine (REMERON) 15 MG tablet Take 2 tablets (30 mg total) by mouth at bedtime. 30 tablet 2   potassium chloride SA (KLOR-CON M) 20 MEQ tablet Take 1 tablet (20 mEq total) by mouth daily. 7 tablet 0   No facility-administered medications prior to visit.     Review of Systems:   Constitutional: No night sweats, fevers, chills. +weight loss, fatigue, lassitude (baseline). HEENT: No headaches, difficulty swallowing, tooth/dental problems, or sore throat. No sneezing, itching, ear ache, nasal congestion, or post nasal drip CV:  No chest pain, orthopnea, PND, swelling in lower extremities, anasarca, dizziness, palpitations, syncope Resp: + shortness of breath with exertion (improved); minimal dry cough. No excess mucus or change in color of mucus. No hemoptysis. No wheezing.  No chest  wall deformity GI:  No heartburn, indigestion, abdominal pain, nausea, vomiting, diarrhea Skin: No rash, lesions, ulcerations MSK:  No joint pain or swelling.   Neuro: No dizziness or lightheadedness.  Psych: No depression or anxiety. Mood stable.     Physical Exam:  BP 102/68 (BP Location: Right Arm, Cuff Size: Normal)   Pulse (!) 116   Temp 98 F (36.7 C) (Temporal)    Ht _0  (1.753 m)   Wt 154 lb 9.6 oz (70.1 kg)   SpO2 94%   BMI 22.83 kg/m   GEN: Pleasant, interactive, well-kempt; in no acute distress HEENT:  Normocephalic and atraumatic. PERRLA. Sclera white. Nasal turbinates pink, moist and patent bilaterally. No rhinorrhea present. Oropharynx pink and moist, without exudate or edema. No lesions, ulcerations, or postnasal drip.  NECK:  Supple w/ fair ROM. No JVD present. Normal carotid impulses w/o bruits. Thyroid symmetrical with no goiter or nodules palpated. No lymphadenopathy.   CV: RRR, no m/r/g, no peripheral edema. Pulses intact, +2 bilaterally. No cyanosis, pallor or clubbing. PULMONARY:  Unlabored, regular breathing. Diminished bases bilaterally A&P w/o wheezes/rales/rhonchi; improved aeration on the right. No accessory muscle use. Right sided PleurX in place; sutures x 2 removed; skin clear, dry and without erythema/edema GI: BS present and normoactive. Soft, non-tender to palpation. No organomegaly or masses detected.  MSK: No erythema, warmth or tenderness. Cap refil <2 sec all extrem. No deformities or joint swelling noted.  Neuro: A/Ox3. No focal deficits noted.   Skin: Warm, no lesions or rashe Psych: Normal affect and behavior. Judgement and thought content appropriate.     Lab Results:  CBC    Component Value Date/Time   WBC 4.7 11/09/2021 1134   WBC 4.9 06/02/2021 0444   RBC 3.28 (L) 11/09/2021 1134   HGB 9.9 (L) 11/09/2021 1134   HCT 30.6 (L) 11/09/2021 1134   PLT 176 11/09/2021 1134   MCV 93.3 11/09/2021 1134   MCH 30.2 11/09/2021 1134   MCHC 32.4 11/09/2021 1134   RDW 18.1 (H) 11/09/2021 1134   LYMPHSABS 0.5 (L) 11/09/2021 1134   MONOABS 0.5 11/09/2021 1134   EOSABS 0.2 11/09/2021 1134   BASOSABS 0.0 11/09/2021 1134    BMET    Component Value Date/Time   NA 139 11/09/2021 1134   K 4.3 11/09/2021 1134   CL 103 11/09/2021 1134   CO2 29 11/09/2021 1134   GLUCOSE 94 11/09/2021 1134   BUN 13 11/09/2021 1134    CREATININE 0.69 11/09/2021 1134   CALCIUM 9.5 11/09/2021 1134   GFRNONAA >60 11/09/2021 1134    BNP No results found for: "BNP"   Imaging:  DG Chest Port 1 View  Result Date: 11/06/2021 CLINICAL DATA:  Congestive heart failure. EXAM: PORTABLE CHEST 1 VIEW COMPARISON:  October 24, 2021. FINDINGS: There is now noted complete opacification of the right hemithorax most likely due to pleural effusion and some degree of atelectasis. Pleural drainage catheter is noted in the right lung base. Right internal jugular Port-A-Cath is unchanged in position. Mediastinal shift to the left is noted. Left lung is clear. IMPRESSION: Complete opacification of the right hemithorax is now noted due to pleural effusion and some degree of atelectasis. Pleural drainage catheter is noted in right lung base. Electronically Signed   By: Marijo Conception M.D.   On: 11/06/2021 13:43   US Thoracentesis Asp Pleural space w/IMG guide  Result Date: 10/24/2021 INDICATION: Lung cancer with large right pleural effusion. Request therapeutic thoracentesis EXAM: ULTRASOUND GUIDED RIGHT  THORACENTESIS MEDICATIONS: 1% lidocaine 10 mL COMPLICATIONS: None immediate. PROCEDURE: An ultrasound guided thoracentesis was thoroughly discussed with the patient and questions answered. The benefits, risks, alternatives and complications were also discussed. The patient understands and wishes to proceed with the procedure. Written consent was obtained. Ultrasound was performed to localize and mark an adequate pocket of fluid in the right chest. The area was then prepped and draped in the normal sterile fashion. 1% Lidocaine was used for local anesthesia. Under ultrasound guidance a 6 Fr Safe-T-Centesis catheter was introduced. Thoracentesis was performed. The catheter was removed and a dressing applied. FINDINGS: A total of approximately 2.2 L of thin red fluid was removed. IMPRESSION: Successful ultrasound guided right thoracentesis yielding 2.2 L of  pleural fluid. Possible small ex vacuo type pneumothorax seen on post-procedure chest x-ray. Patient is asymptomatic. Procedure performed by: Gareth Eagle, PA-C Electronically Signed   By: Aletta Edouard M.D.   On: 10/24/2021 16:16   DG Chest 1 View  Result Date: 10/24/2021 CLINICAL DATA:  Post thoracentesis with 2.2 L removed from the RIGHT chest. EXAM: CHEST  1 VIEW COMPARISON:  October 18, 2021 FINDINGS: RIGHT-sided Port-A-Cath terminates in the mid SVC. Cardiomediastinal contours and hilar structures grossly stable and largely obscured in the RIGHT chest due to persistent large RIGHT-sided pleural effusion which appears to have diminished following thoracentesis. Potential small pneumothorax over the RIGHT upper chest mixed with pleural fluid and pleural disease. LEFT chest is clear. On limited assessment no acute skeletal process. IMPRESSION: 1. Persistent large RIGHT-sided pleural effusion which appears to have diminished following thoracentesis. Potential small pneumothorax over the RIGHT upper chest mixed with pleural fluid and pleural disease. Could consider further evaluation with expiratory view as warranted. If present this likely represents a small ex vacuo type pneumothorax. 2. LEFT chest is clear. These results were called by telephone at the time of interpretation on 10/24/2021 at 3:28 pm to provider [facility] , who verbally acknowledged these results. Electronically Signed   By: Zetta Bills M.D.   On: 10/24/2021 15:28    acetaminophen (TYLENOL) tablet 650 mg     Date Action Dose Route User   Discharged on 11/06/2021   Admitted on 11/06/2021   11/02/2021 7253 Given 650 mg Oral Derrick Ravel, RN      atropine injection 0.5 mg     Date Action Dose Route User   Discharged on 11/06/2021   Admitted on 11/06/2021   11/01/2021 1635 Given 0.5 mg Intravenous Alvera Singh, RN      atropine injection 0.5 mg     Date Action Dose Route User   11/09/2021 1323 Given 0.5  mg Intravenous Willis Modena, RN      dexamethasone (DECADRON) 10 mg in sodium chloride 0.9 % 50 mL IVPB     Date Action Dose Route User   Discharged on 11/06/2021   Admitted on 11/06/2021   09/28/2021 1046 Rate/Dose Change (none) Intravenous Alvera Singh, RN   09/28/2021 1046 New Bag/Given 10 mg Intravenous Alvera Singh, RN      dexamethasone (DECADRON) 10 mg in sodium chloride 0.9 % 50 mL IVPB     Date Action Dose Route User   Discharged on 11/06/2021   Admitted on 11/06/2021   11/01/2021 1552 Rate/Dose Change (none) Intravenous Jesse Fall, RN   11/01/2021 1552 Infusion Verify (none) Intravenous Jesse Fall, RN   11/01/2021 1552 New Bag/Given 10 mg Intravenous Alvera Singh, RN      dexamethasone (DECADRON)  10 mg in sodium chloride 0.9 % 50 mL IVPB     Date Action Dose Route User   11/09/2021 1234 Rate/Dose Change (none) Intravenous Willis Modena, RN   11/09/2021 1233 New Bag/Given 10 mg Intravenous Rolene Course, RN      diphenhydrAMINE (BENADRYL) capsule 25 mg     Date Action Dose Route User   Discharged on 11/06/2021   Admitted on 11/06/2021   11/02/2021 4540 Given 25 mg Oral Derrick Ravel, RN      heparin lock flush 100 unit/mL     Date Action Dose Route User   Discharged on 11/06/2021   Admitted on 11/06/2021   09/28/2021 1259 Given 500 Units Intracatheter Alvera Singh, RN      heparin lock flush 100 unit/mL     Date Action Dose Route User   Discharged on 11/06/2021   Admitted on 11/06/2021   11/01/2021 1838 Given 500 Units Intracatheter Jesse Fall, RN      heparin lock flush 100 unit/mL     Date Action Dose Route User   Discharged on 11/06/2021   Admitted on 11/06/2021   11/02/2021 1354 Given 500 Units Intracatheter Derrick Ravel, RN      heparin lock flush 100 unit/mL     Date Action Dose Route User   11/09/2021 1504 Given 500 Units Intracatheter Willis Modena, RN       irinotecan (CAMPTOSAR) 120 mg in sodium chloride 0.9 % 500 mL chemo infusion     Date Action Dose Route User   Discharged on 11/06/2021   Admitted on 11/06/2021   11/01/2021 1642 Infusion Verify (none) Intravenous Jesse Fall, RN   11/01/2021 1642 New Bag/Given 120 mg Intravenous Alvera Singh, RN      irinotecan (CAMPTOSAR) 120 mg in sodium chloride 0.9 % 500 mL chemo infusion     Date Action Dose Route User   11/09/2021 1328 Infusion Verify (none) Intravenous Willis Modena, RN   11/09/2021 1328 Rate/Dose Change (none) Intravenous Willis Modena, RN   11/09/2021 1328 New Bag/Given 120 mg Intravenous Willis Modena, RN      lurbinectedin [facility]) 5 mg in sodium chloride 0.9 % 250 mL chemo infusion     Date Action Dose Route User   Discharged on 11/06/2021   Admitted on 11/06/2021   09/28/2021 1140 Rate/Dose Change (none) Intravenous Alvera Singh, RN   09/28/2021 1140 Infusion Verify (none) Intravenous Alvera Singh, RN   09/28/2021 1140 Rate/Dose Change (none) Intravenous Alvera Singh, RN   09/28/2021 1140 New Bag/Given 5 mg Intravenous Alvera Singh, RN      palonosetron (ALOXI) injection 0.25 mg     Date Action Dose Route User   Discharged on 11/06/2021   Admitted on 11/06/2021   09/28/2021 1041 Given 0.25 mg Intravenous Alvera Singh, RN      palonosetron (ALOXI) injection 0.25 mg     Date Action Dose Route User   Discharged on 11/06/2021   Admitted on 11/06/2021   11/01/2021 1550 Given 0.25 mg Intravenous Alvera Singh, RN      palonosetron (ALOXI) injection 0.25 mg     Date Action Dose Route User   11/09/2021 1228 Given 0.25 mg Intravenous Rolene Course, RN      0.9 %  sodium chloride infusion     Date Action Dose Route User   Discharged on 11/06/2021   Admitted on 11/06/2021   09/28/2021 1256 Rate/Dose Change (none)  Intravenous Alvera Singh, RN   09/28/2021 1248 Rate/Dose  Change (none) Intravenous Alvera Singh, RN   09/28/2021 1134 Infusion Verify (none) Intravenous Alvera Singh, RN   09/28/2021 1107 Restarted (none) Intravenous Alvera Singh, RN   09/28/2021 1102 Infusion Verify (none) Intravenous Alvera Singh, RN      0.9 %  sodium chloride infusion     Date Action Dose Route User   Discharged on 11/06/2021   Admitted on 11/06/2021   11/01/2021 1832 Rate/Dose Change (none) Intravenous Jesse Fall, RN   11/01/2021 1826 Rate/Dose Change (none) Intravenous Jesse Fall, RN   11/01/2021 1801 Infusion Verify (none) Intravenous Jesse Fall, RN   11/01/2021 1801 Infusion Verify (none) Intravenous Jesse Fall, RN   11/01/2021 1646 Infusion Verify (none) Intravenous Jesse Fall, RN      0.9 %  sodium chloride infusion     Date Action Dose Route User   11/09/2021 1504 Rate/Dose Change (none) Intravenous Willis Modena, RN   11/09/2021 1328 Rate/Dose Change (none) Intravenous Willis Modena, RN   11/09/2021 1324 Rate/Dose Change (none) Intravenous Willis Modena, RN   11/09/2021 1323 Infusion Verify (none) Intravenous Willis Modena, RN   11/09/2021 1256 Infusion Verify (none) Intravenous Willis Modena, RN      sodium chloride flush (NS) 0.9 % injection 10 mL     Date Action Dose Route User   Discharged on 11/06/2021   Admitted on 11/06/2021   09/28/2021 1259 Given 10 mL Intracatheter Anhaiser, Guadelupe Sabin, RN      sodium chloride flush (NS) 0.9 % injection 10 mL     Date Action Dose Route User   Discharged on 11/06/2021   Admitted on 11/06/2021   10/19/2021 1023 Given 10 mL Intracatheter Marlowe Aschoff B, RN      sodium chloride flush (NS) 0.9 % injection 10 mL     Date Action Dose Route User   Discharged on 11/06/2021   Admitted on 11/06/2021   11/01/2021 1308 Given 10 mL Intracatheter Marlowe Aschoff B, RN      sodium chloride flush (NS) 0.9 % injection 10 mL     Date Action Dose  Route User   Discharged on 11/06/2021   Admitted on 11/06/2021   11/01/2021 1834 Given 10 mL Intracatheter Jesse Fall, RN      sodium chloride flush (NS) 0.9 % injection 10 mL     Date Action Dose Route User   Discharged on 11/06/2021   Admitted on 11/06/2021   11/02/2021 1354 Given 10 mL Intracatheter Derrick Ravel, RN      sodium chloride flush (NS) 0.9 % injection 10 mL     Date Action Dose Route User   11/09/2021 1126 Given 10 mL Intracatheter Gobble, Jasmine A, LPN      sodium chloride flush (NS) 0.9 % injection 10 mL     Date Action Dose Route User   11/09/2021 1504 Given 10 mL Intracatheter Willis Modena, RN      0.9 %  sodium chloride infusion (Manually program via Guardrails IV Fluids)     Date Action Dose Route User   Discharged on 11/06/2021   Admitted on 11/06/2021   11/02/2021 0901 New Bag/Given 250 mL Intravenous Derrick Ravel, RN          Latest Ref Rng & Units 03/09/2020    9:06 AM  PFT Results  FVC-Pre L 3.47   FVC-Predicted Pre % 91   FVC-Post L 3.40  FVC-Predicted Post % 89   Pre FEV1/FVC % % 78   Post FEV1/FCV % % 78   FEV1-Pre L 2.69   FEV1-Predicted Pre % 91   FEV1-Post L 2.65   DLCO uncorrected ml/min/mmHg 14.98   DLCO UNC% % 56   DLCO corrected ml/min/mmHg 15.96   DLCO COR %Predicted % 60   DLVA Predicted % 84   TLC L 5.98   TLC % Predicted % 87   RV % Predicted % 109     No results found for: "NITRICOXIDE"      Assessment & Plan:   Malignant pleural effusion Recurrent malignant right pleural effusion. He had PleurX placed on 10/30 with Dr. Valeta Harms. Clinically improved from respiratory standpoint. Recovering well post procedure. They are comfortable with draining and understand proper care of device. Sutures removed today. Unfortunately, are almost out of supplies and have not heard from home health or medical supply. Provided with a few kits from our office today. Orders faxed to [facility] today - advised them  to contact them today to schedule delivery. I have also placed orders for home health, should they need any assistance at home.  Educated on return precautions. Verbalized understanding.  Patient Instructions  The sutures from your PleurX were removed today. The site looks great.  Continue to drain daily if you are still getting volumes >150-300. If your drainage output begins to decrease less than 150-300, you can move to every other day.   Monitor your symptoms. If you noticed that you are having more trouble with your breathing, increased cough, pressure on the right side, pain, or other concerning symptoms, please call us to come in for imaging and a visit.   I have sent orders to the medical supply company to get your supplies to you ASAP. Please call them later this afternoon if you have not heard anything. [phone]  Follow up in 6 weeks with Dr. Lamonte Sakai. If symptoms worsen, please contact office for sooner follow up or seek emergency care.    I spent 32 minutes of dedicated to the care of this patient on the date of this encounter to include pre-visit review of records, face-to-face time with the patient discussing conditions above, post visit ordering of testing, clinical documentation with the electronic health record, making appropriate referrals as documented, and communicating necessary findings to members of the patients care team.  Clayton Bibles, NP 11/20/2021  Pt aware and understands NP's role.

## 2021-11-16 NOTE — Telephone Encounter (Signed)
Spoke with Adam [name] about leaving 2 Pleurix drain kits up front that need to be picked up for him to use over the weekend after [place] about pt needing supplies and has none for the weekend. Abran Richard stated it takes 24 hrs. After the form is faxed (sent today at 10:21am) and 5-7 business days before supplies are normally sent. Pt. Expressed understanding and stated he would send someone to pick up these supplies.

## 2021-11-16 NOTE — Patient Instructions (Addendum)
The sutures from your PleurX were removed today. The site looks great.  Continue to drain daily if you are still getting volumes >150-300. If your drainage output begins to decrease less than 150-300, you can move to every other day.   Monitor your symptoms. If you noticed that you are having more trouble with your breathing, increased cough, pressure on the right side, pain, or other concerning symptoms, please call us to come in for imaging and a visit.   I have sent orders to the medical supply company to get your supplies to you ASAP. Please call them later this afternoon if you have not heard anything. [phone]  Follow up in 6 weeks with Adam [name]. If symptoms worsen, please contact office for sooner follow up or seek emergency care.

## 2021-11-16 NOTE — Assessment & Plan Note (Addendum)
Recurrent malignant right pleural effusion. He had PleurX placed on 10/30 with Dr. Valeta Harms. Clinically improved from respiratory standpoint. Recovering well post procedure. They are comfortable with draining and understand proper care of device. Sutures removed today. Unfortunately, are almost out of supplies and have not heard from home health or medical supply. Provided with a few kits from our office today. Orders faxed to [facility] today - advised them to contact them today to schedule delivery. I have also placed orders for home health, should they need any assistance at home.  Educated on return precautions. Verbalized understanding.  Patient Instructions  The sutures from your PleurX were removed today. The site looks great.  Continue to drain daily if you are still getting volumes >150-300. If your drainage output begins to decrease less than 150-300, you can move to every other day.   Monitor your symptoms. If you noticed that you are having more trouble with your breathing, increased cough, pressure on the right side, pain, or other concerning symptoms, please call us to come in for imaging and a visit.   I have sent orders to the medical supply company to get your supplies to you ASAP. Please call them later this afternoon if you have not heard anything. [phone]  Follow up in 6 weeks with Dr. Lamonte Sakai. If symptoms worsen, please contact office for sooner follow up or seek emergency care.

## 2021-11-21 ENCOUNTER — Inpatient Hospital Stay: Payer: Commercial Managed Care - HMO

## 2021-11-21 ENCOUNTER — Telehealth: Payer: Self-pay | Admitting: Physician Assistant

## 2021-11-21 ENCOUNTER — Inpatient Hospital Stay: Payer: Commercial Managed Care - HMO | Admitting: Physician Assistant

## 2021-11-21 MED FILL — Dexamethasone Sodium Phosphate Inj 100 MG/10ML: INTRAMUSCULAR | Qty: 1 | Status: AC

## 2021-11-21 NOTE — Telephone Encounter (Signed)
I had been in communication with Delsa Sale, Therapist, sports from [facility]. The patient recently had pleurx catheter placed. Due to his current chemotherapy, they are not able to accept him as a patient, although they were try to brainstorm options, which we appreciate. Due to his insurance, home health companies are not accepting his insurance. I will reach out to pulmonary medicine to see if they can drain his pleurx in office, or reach out to him to train a friend or family member.

## 2021-11-22 ENCOUNTER — Inpatient Hospital Stay: Payer: Commercial Managed Care - HMO | Admitting: Emergency Medicine

## 2021-11-22 ENCOUNTER — Other Ambulatory Visit: Payer: Self-pay

## 2021-11-22 ENCOUNTER — Inpatient Hospital Stay: Payer: Commercial Managed Care - HMO | Admitting: Internal Medicine

## 2021-11-22 ENCOUNTER — Encounter: Payer: Self-pay | Admitting: Internal Medicine

## 2021-11-22 ENCOUNTER — Telehealth: Payer: Self-pay | Admitting: Nurse Practitioner

## 2021-11-22 ENCOUNTER — Inpatient Hospital Stay: Payer: Commercial Managed Care - HMO

## 2021-11-22 VITALS — BP 98/67 | HR 126 | Temp 97.8°F | Resp 15 | Wt 153.0 lb

## 2021-11-22 DIAGNOSIS — C7951 Secondary malignant neoplasm of bone: Secondary | ICD-10-CM | POA: Diagnosis not present

## 2021-11-22 DIAGNOSIS — C3491 Malignant neoplasm of unspecified part of right bronchus or lung: Secondary | ICD-10-CM

## 2021-11-22 DIAGNOSIS — C3411 Malignant neoplasm of upper lobe, right bronchus or lung: Secondary | ICD-10-CM

## 2021-11-22 DIAGNOSIS — Z95828 Presence of other vascular implants and grafts: Secondary | ICD-10-CM

## 2021-11-22 LAB — CMP (CANCER CENTER ONLY)
ALT: 15 U/L (ref 0–44)
AST: 34 U/L (ref 15–41)
Albumin: 3.3 g/dL — ABNORMAL LOW (ref 3.5–5.0)
Alkaline Phosphatase: 62 U/L (ref 38–126)
Anion gap: 10 (ref 5–15)
BUN: 15 mg/dL (ref 8–23)
CO2: 28 mmol/L (ref 22–32)
Calcium: 10.4 mg/dL — ABNORMAL HIGH (ref 8.9–10.3)
Chloride: 100 mmol/L (ref 98–111)
Creatinine: 0.81 mg/dL (ref 0.61–1.24)
GFR, Estimated: >60 mL/min (ref >60)
Glucose, Bld: 103 mg/dL — ABNORMAL HIGH (ref 70–99)
Potassium: 4.5 mmol/L (ref 3.5–5.1)
Sodium: 138 mmol/L (ref 135–145)
Total Bilirubin: 0.8 mg/dL (ref 0.3–1.2)
Total Protein: 7.2 g/dL (ref 6.5–8.1)

## 2021-11-22 LAB — CBC WITH DIFFERENTIAL (CANCER CENTER ONLY)
Abs Immature Granulocytes: 0.01 10*3/uL (ref 0.00–0.07)
Basophils Absolute: 0 10*3/uL (ref 0.0–0.1)
Basophils Relative: 0 %
Eosinophils Absolute: 0 10*3/uL (ref 0.0–0.5)
Eosinophils Relative: 0 %
HCT: 31 % — ABNORMAL LOW (ref 39.0–52.0)
Hemoglobin: 9.9 g/dL — ABNORMAL LOW (ref 13.0–17.0)
Immature Granulocytes: 0 %
Lymphocytes Relative: 9 %
Lymphs Abs: 0.5 10*3/uL — ABNORMAL LOW (ref 0.7–4.0)
MCH: 30.1 pg (ref 26.0–34.0)
MCHC: 31.9 g/dL (ref 30.0–36.0)
MCV: 94.2 fL (ref 80.0–100.0)
Monocytes Absolute: 0.7 10*3/uL (ref 0.1–1.0)
Monocytes Relative: 13 %
Neutro Abs: 4.3 10*3/uL (ref 1.7–7.7)
Neutrophils Relative %: 78 %
Platelet Count: 169 10*3/uL (ref 150–400)
RBC: 3.29 MIL/uL — ABNORMAL LOW (ref 4.22–5.81)
RDW: 19.2 % — ABNORMAL HIGH (ref 11.5–15.5)
WBC Count: 5.5 10*3/uL (ref 4.0–10.5)
nRBC: 0 % (ref 0.0–0.2)

## 2021-11-22 LAB — SAMPLE TO BLOOD BANK

## 2021-11-22 MED ORDER — SODIUM CHLORIDE 0.9 % IV SOLN
10.0000 mg | Freq: Once | INTRAVENOUS | Status: AC
  Administered 2021-11-22: 10 mg via INTRAVENOUS
  Filled 2021-11-22: qty 10

## 2021-11-22 MED ORDER — SODIUM CHLORIDE 0.9 % IV SOLN: INTRAVENOUS | Status: AC

## 2021-11-22 MED ORDER — PALONOSETRON HCL INJECTION 0.25 MG/5ML
0.2500 mg | Freq: Once | INTRAVENOUS | Status: AC
  Administered 2021-11-22: 0.25 mg via INTRAVENOUS
  Filled 2021-11-22: qty 5

## 2021-11-22 MED ORDER — METHYLPREDNISOLONE 4 MG PO TBPK: ORAL_TABLET | ORAL | 0 refills | Status: AC

## 2021-11-22 MED ORDER — SODIUM CHLORIDE 0.9% FLUSH
10.0000 mL | Freq: Once | INTRAVENOUS | Status: AC
  Administered 2021-11-22: 10 mL

## 2021-11-22 MED ORDER — SODIUM CHLORIDE 0.9 % IV SOLN
65.0000 mg/m2 | Freq: Once | INTRAVENOUS | Status: AC
  Administered 2021-11-22: 120 mg via INTRAVENOUS
  Filled 2021-11-22: qty 6

## 2021-11-22 MED ORDER — ATROPINE SULFATE 1 MG/ML IV SOLN
0.5000 mg | Freq: Once | INTRAVENOUS | Status: AC | PRN
  Administered 2021-11-22: 0.5 mg via INTRAVENOUS
  Filled 2021-11-22: qty 1

## 2021-11-22 NOTE — Research (Signed)
TRIAL: [id number]-Limited Stage Small Cell Lung Cancer (LS-SCLC): A Phase II/III Randomized Study of Chemoradiation Versus Chemoradiation Plus Atezolizumab  ADDENDUM  PROs (Quality of Life and PRO-CTCAE assessments) were completed by patient but are not required at this time point.  Wells Guiles 'Learta CoddingNeysa Bonito, RN, BSN Clinical Research Nurse I 11/22/21 3:14 PM

## 2021-11-22 NOTE — Progress Notes (Signed)
Per Dr. Julien Nordmann , it is okay to run IVF concurrently with Camptosar.

## 2021-11-22 NOTE — Telephone Encounter (Signed)
Please ensure pt has received PleurX supplies and is not having any difficulties with drainage. We'll need to just have close follow up with him since there is no accepting home health agency. He should contact us and I will see him if he has any issues.

## 2021-11-22 NOTE — Progress Notes (Addendum)
Per Dr. Julien Nordmann pt may receive camptosar with pulse of 126. Pt will receive normal saline liter today.

## 2021-11-22 NOTE — Research (Signed)
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    PROs: Patient completed study required PROs before any other assessments and completion was verified.   LABS: Used SOC labs drawn today for assessment (CMP and CBC).  Patient [name] tolerated well without complaint.   MEDICATION REVIEW: Patient reviews and verifies the current medication list is correct.  Reported changes were recorded on the medication list and marked as reviewed. All other listed medications remain the same.  MD Mohamed added medrol dosepak today.   VITAL SIGNS: Vital signs are collected per study protocol during MD [facility] appt today. BP 98/67 HR 126 bpm Resp 15/min Temp 97.8 O2 97% on room air Wt 153 lb   MD/PROVIDER VISIT: Patient sees MD [facility] for today's visit.  See MD note for more details.   ADVERSE EVENTS: USING CTCAE V. 5.0 Patient was admitted for pleural effusion management/pleurex drain placement on 11/06/21.  All AE's found in this admission already ruled as unrelated to study per MD [facility]. All AE's, abnormal vitals, and abnormal labs that have occurred since previous Research visit are unrelated to study per MD [facility], no action or reporting required.   SOLICITED ADVERSE EVENTS:             Patient denies any of the protocol required solicited adverse events: pneumonitis, pericarditis, esophagitis, and encephalitis.  No action or reporting at this time required.     IMAGING & DISEASE STATUS:             Restaging CT Chest/Abd/Pelvis was done on 10/18/21, patient showed progression of disease.  Patient stopped Zepzelca d/t disease progression (last dose 09/28/21).  Patient started on irinotecan on 11/01/21, continuing on this treatment today.  Patient not receiving any study  intervention at this time (Arm 1), remains on study.  DISPOSITION: Upon completion off all study requirements, patient was escorted to infusion appt with his ex wife and his belongings.  Will f/u with patient at next visit in 3 months.  The patient was thanked for their time and continued voluntary participation in this study. Patient Adam [name] has been provided direct contact information and is encouraged to contact this Nurse for any needs or questions.   Wells Guiles 'Learta CoddingNeysa Bonito, RN, BSN Clinical Research Nurse I 11/22/21 10:03 AM

## 2021-11-22 NOTE — Telephone Encounter (Signed)
Adam [name] has placed order for home health on 11/9.  I have contacted 6 different companies and can't find anyone who can take him due to not in-network with insurance or no availability.  Message created to make Adam [name] aware and to see how to proceed.

## 2021-11-22 NOTE — Telephone Encounter (Signed)
Routing to [employer]. Please advise on Sherry's message.

## 2021-11-22 NOTE — Patient Instructions (Signed)
[facility]  Discharge Instructions: Thank you for choosing [facility] to provide your oncology and hematology care.   If you have a lab appointment with the [facility], please go directly to the [facility] and check in at the registration area.   Wear comfortable clothing and clothing appropriate for easy access to any Portacath or PICC line.   We strive to give you quality time with your provider. You may need to reschedule your appointment if you arrive late (15 or more minutes).  Arriving late affects you and other patients whose appointments are after yours.  Also, if you miss three or more appointments without notifying the office, you may be dismissed from the clinic at the provider's discretion.      For prescription refill requests, have your pharmacy contact our office and allow 72 hours for refills to be completed.    Today you received the following chemotherapy and/or immunotherapy agents: Irinotecan (Camptosar)      To help prevent nausea and vomiting after your treatment, we encourage you to take your nausea medication as directed.  BELOW ARE SYMPTOMS THAT SHOULD BE REPORTED IMMEDIATELY: *FEVER GREATER THAN 100.4 F (38 C) OR HIGHER *CHILLS OR SWEATING *NAUSEA AND VOMITING THAT IS NOT CONTROLLED WITH YOUR NAUSEA MEDICATION *UNUSUAL SHORTNESS OF BREATH *UNUSUAL BRUISING OR BLEEDING *URINARY PROBLEMS (pain or burning when urinating, or frequent urination) *BOWEL PROBLEMS (unusual diarrhea, constipation, pain near the anus) TENDERNESS IN MOUTH AND THROAT WITH OR WITHOUT PRESENCE OF ULCERS (sore throat, sores in mouth, or a toothache) UNUSUAL RASH, SWELLING OR PAIN  UNUSUAL VAGINAL DISCHARGE OR ITCHING   Items with * indicate a potential emergency and should be followed up as soon as possible or go to the Emergency Department if any problems should occur.  Please show the CHEMOTHERAPY ALERT CARD or IMMUNOTHERAPY ALERT CARD at  check-in to the Emergency Department and triage nurse.  Should you have questions after your visit or need to cancel or reschedule your appointment, please contact [facility]  Dept: [phone]  and follow the prompts.  Office hours are 8:00 a.m. to 4:30 p.m. Monday - Friday. Please note that voicemails left after 4:00 p.m. may not be returned until the following business day.  We are closed weekends and major holidays. You have access to a nurse at all times for urgent questions. Please call the main number to the clinic Dept: [phone] and follow the prompts.   For any non-urgent questions, you may also contact your provider using MyChart. We now offer e-Visits for anyone 35 and older to request care online for non-urgent symptoms. For details visit [email or web address].   Also download the MyChart app! Go to the app store, search "MyChart", open the app, select [facility], and log in with your MyChart username and password.  Masks are optional in the cancer centers. If you would like for your care team to wear a mask while they are taking care of you, please let them know. You may have one support person who is at least 64 years old accompany you for your appointments. Irinotecan Injection What is this medication? IRINOTECAN (ir in oh TEE kan) treats some types of cancer. It works by slowing down the growth of cancer cells. This medicine may be used for other purposes; ask your health care provider or pharmacist if you have questions. COMMON BRAND NAME(S): Camptosar What should I tell my care team before I take this  medication? They need to know if you have any of these conditions: Dehydration Diarrhea Infection, especially a viral infection, such as chickenpox, cold sores, herpes Liver disease Low blood cell levels (white cells, red cells, and platelets) Low levels of electrolytes, such as calcium, magnesium, or potassium in your blood Recent or  ongoing radiation An unusual or allergic reaction to irinotecan, other medications, foods, dyes, or preservatives If you or your partner are pregnant or trying to get pregnant Breast-feeding How should I use this medication? This medication is injected into a vein. It is given by your care team in a hospital or clinic setting. Talk to your care team about the use of this medication in children. Special care may be needed. Overdosage: If you think you have taken too much of this medicine contact a poison control center or emergency room at once. NOTE: This medicine is only for you. Do not share this medicine with others. What if I miss a dose? Keep appointments for follow-up doses. It is important not to miss your dose. Call your care team if you are unable to keep an appointment. What may interact with this medication? Do not take this medication with any of the following: Cobicistat Itraconazole This medication may also interact with the following: Certain antibiotics, such as clarithromycin, rifampin, rifabutin Certain antivirals for HIV or AIDS Certain medications for fungal infections, such as ketoconazole, posaconazole, voriconazole Certain medications for seizures, such as carbamazepine, phenobarbital, phenytoin Gemfibrozil Nefazodone St. John's wort This list may not describe all possible interactions. Give your health care provider a list of all the medicines, herbs, non-prescription drugs, or dietary supplements you use. Also tell them if you smoke, drink alcohol, or use illegal drugs. Some items may interact with your medicine. What should I watch for while using this medication? Your condition will be monitored carefully while you are receiving this medication. You may need blood work while taking this medication. This medication may make you feel generally unwell. This is not uncommon as chemotherapy can affect healthy cells as well as cancer cells. Report any side effects.  Continue your course of treatment even though you feel ill unless your care team tells you to stop. This medication can cause serious side effects. To reduce the risk, your care team may give you other medications to take before receiving this one. Be sure to follow the directions from your care team. This medication may affect your coordination, reaction time, or judgement. Do not drive or operate machinery until you know how this medication affects you. Sit up or stand slowly to reduce the risk of dizzy or fainting spells. Drinking alcohol with this medication can increase the risk of these side effects. This medication may increase your risk of getting an infection. Call your care team for advice if you get a fever, chills, sore throat, or other symptoms of a cold or flu. Do not treat yourself. Try to avoid being around people who are sick. Avoid taking medications that contain aspirin, acetaminophen, ibuprofen, naproxen, or ketoprofen unless instructed by your care team. These medications may hide a fever. This medication may increase your risk to bruise or bleed. Call your care team if you notice any unusual bleeding. Be careful brushing or flossing your teeth or using a toothpick because you may get an infection or bleed more easily. If you have any dental work done, tell your dentist you are receiving this medication. Talk to your care team if you or your partner are pregnant or  think either of you might be pregnant. This medication can cause serious birth defects if taken during pregnancy and for 6 months after the last dose. You will need a negative pregnancy test before starting this medication. Contraception is recommended while taking this medication and for 6 months after the last dose. Your care team can help you find the option that works for you. Do not father a child while taking this medication and for 3 months after the last dose. Use a condom for contraception during this time period. Do  not breastfeed while taking this medication and for 7 days after the last dose. This medication may cause infertility. Talk to your care team if you are concerned about your fertility. What side effects may I notice from receiving this medication? Side effects that you should report to your care team as soon as possible: Allergic reactions--skin rash, itching, hives, swelling of the face, lips, tongue, or throat Dry cough, shortness of breath or trouble breathing Increased saliva or tears, increased sweating, stomach cramping, diarrhea, small pupils, unusual weakness or fatigue, slow heartbeat Infection--fever, chills, cough, sore throat, wounds that don't heal, pain or trouble when passing urine, general feeling of discomfort or being unwell Kidney injury--decrease in the amount of urine, swelling of the ankles, hands, or feet Low red blood cell level--unusual weakness or fatigue, dizziness, headache, trouble breathing Severe or prolonged diarrhea Unusual bruising or bleeding Side effects that usually do not require medical attention (report to your care team if they continue or are bothersome): Constipation Diarrhea Hair loss Loss of appetite Nausea Stomach pain This list may not describe all possible side effects. Call your doctor for medical advice about side effects. You may report side effects to FDA at 1-800-FDA-1088. Where should I keep my medication? This medication is given in a hospital or clinic. It will not be stored at home. NOTE: This sheet is a summary. It may not cover all possible information. If you have questions about this medicine, talk to your doctor, pharmacist, or health care provider.  2023 Elsevier/Gold Standard (2021-05-04 00:00:00)

## 2021-11-22 NOTE — Progress Notes (Signed)
Adam [name] [phone]   [phone]  OFFICE PROGRESS NOTE  de Adam [name], Adam J, MD [address] [place] [address]  DIAGNOSIS: Metastatic small cell lung cancer initially diagnosed as Limited stage (T1c, N2, M0) small cell lung cancer diagnosed in February 2022.  PRIOR THERAPY:  1) Systemic chemotherapy according to phase 3 randomized clinical trial of chemoradiation versus chemoradiation plus atezolizumab status post 3 cycles.  This treatment was discontinued secondary to intolerance. 2) palliative radiotherapy to the left hip area under the care of Dr. Tammi Klippel completed January 03, 2021. 3) Second line systemic chemotherapy with carboplatin for AUC of 5 on day 1, etoposide 100 Mg/M2 on days 1, 2 and 3 as well as Imfinzi 1500 Mg IV on day 1 and Cosela 240 Mg/M2 before chemotherapy on days 1, 2 and 3 every 3 weeks.  First dose December 13, 2020.  Status post 5 cycles.  Starting from cycle #5 the patient will be on maintenance treatment with Imfinzi every 4 weeks.  This was discontinued secondary to disease progression 4)Third line systemic chemotherapy with Zepzelca (lurbinectedin) 3.2 Mg/M2 IV every 3 weeks.  First dose 04/19/2021.  Status post 6 cycles.  The dose of Zepzelca (lurbinectedin) was reduced to 2.6 Mg/M2 starting from cycle #3   CURRENT THERAPY: Palliative systemic chemotherapy with Irinotecan on days 1 and 8 IV every 3 weeks.  First dose expected on 11/01/2021. Status post 1 cycle.   INTERVAL HISTORY: Adam [name] 66 y.o. male returns to the clinic today for follow-up visit accompanied by his wife.  The patient is feeling fine today with no concerning complaints except for the generalized fatigue and weakness.  He also lost around 5 pounds since his last visit.  He has no current chest pain, shortness of breath, cough or hemoptysis.  He has no nausea, vomiting, diarrhea or constipation.  He has no headache or visual changes.  He has  no weight loss or night sweats.  He tolerated the first cycle of his treatment with irinotecan fairly well.  He is here today for evaluation before starting cycle #2.   MEDICAL HISTORY: Past Medical History:  Diagnosis Date   Hypertension    Lung cancer ([place])    small cell RUL    ALLERGIES:  has No Known Allergies.  MEDICATIONS:  Current Outpatient Medications  Medication Sig Dispense Refill   acetaminophen (TYLENOL) 650 MG CR tablet Take 650 mg by mouth every 8 (eight) hours as needed for pain.     bisacodyl (DULCOLAX) 5 MG EC tablet Take 5 mg by mouth daily as needed for moderate constipation.     lidocaine-prilocaine (EMLA) cream Apply 1 application topically as needed. (Patient taking differently: Apply 1 application  topically as needed (to access port).) 30 g 2   mirtazapine (REMERON) 15 MG tablet Take 2 tablets (30 mg total) by mouth at bedtime. 30 tablet 2   potassium chloride SA (KLOR-CON M) 20 MEQ tablet Take 1 tablet (20 mEq total) by mouth daily. 7 tablet 0   No current facility-administered medications for this visit.    SURGICAL HISTORY:  Past Surgical History:  Procedure Laterality Date   BRONCHIAL BRUSHINGS  02/08/2020   Procedure: BRONCHIAL BRUSHINGS;  Surgeon: Freddi Starr, MD;  Location: [facility];  Service: Pulmonary;;   BRONCHIAL NEEDLE ASPIRATION BIOPSY  02/08/2020   Procedure: BRONCHIAL NEEDLE ASPIRATION BIOPSIES;  Surgeon: Freddi Starr, MD;  Location: [facility] ENDOSCOPY;  Service: Pulmonary;;  CHEST TUBE INSERTION N/A 11/06/2021   Procedure: INSERTION PLEURAL DRAINAGE CATHETER;  Surgeon: Garner Nash, DO;  Location: [facility];  Service: Pulmonary;  Laterality: N/A;   INCISION AND DRAINAGE ABSCESS Left 05/31/2021   Procedure: Left Hip Excision;  Surgeon: Michael Boston, MD;  Location: [facility];  Service: General;  Laterality: Left;   IR IMAGING GUIDED PORT INSERTION  12/30/2020   IR IMAGING GUIDED PORT INSERTION  07/19/2021   IR REMOVAL TUN  ACCESS W/ PORT W/O FL MOD SED  05/26/2021   KNEE SURGERY Right    SHOULDER SURGERY Left    VIDEO BRONCHOSCOPY WITH ENDOBRONCHIAL ULTRASOUND N/A 02/08/2020   Procedure: VIDEO BRONCHOSCOPY WITH ENDOBRONCHIAL ULTRASOUND;  Surgeon: Freddi Starr, MD;  Location: [facility];  Service: Pulmonary;  Laterality: N/A;    REVIEW OF SYSTEMS:  Constitutional: positive for anorexia, fatigue, and weight loss Eyes: negative Ears, nose, mouth, throat, and face: negative Respiratory: negative Cardiovascular: negative Gastrointestinal: negative Genitourinary:negative Integument/breast: negative Hematologic/lymphatic: negative Musculoskeletal:positive for arthralgias and muscle weakness Neurological: negative Behavioral/Psych: negative Endocrine: negative Allergic/Immunologic: negative   PHYSICAL EXAMINATION: General appearance: alert, cooperative, fatigued, and no distress Head: Normocephalic, without obvious abnormality, atraumatic Neck: no adenopathy, no JVD, supple, symmetrical, trachea midline, and thyroid not enlarged, symmetric, no tenderness/mass/nodules Lymph nodes: Cervical, supraclavicular, and axillary nodes normal. Resp: clear to auscultation bilaterally Back: symmetric, no curvature. ROM normal. No CVA tenderness. Cardio: regular rate and rhythm, S1, S2 normal, no murmur, click, rub or gallop GI: soft, non-tender; bowel sounds normal; no masses,  no organomegaly Extremities: extremities normal, atraumatic, no cyanosis or edema Neurologic: Alert and oriented X 3, normal strength and tone. Normal symmetric reflexes. Normal coordination and gait  ECOG PERFORMANCE STATUS: 2 - Symptomatic, <50% confined to bed  Blood pressure 98/67, pulse (!) 126, temperature 97.8 F (36.6 C), resp. rate 15, weight 153 lb (69.4 kg), SpO2 97 %.  LABORATORY DATA: Lab Results  Component Value Date   WBC 4.7 11/09/2021   HGB 9.9 (L) 11/09/2021   HCT 30.6 (L) 11/09/2021   MCV 93.3 11/09/2021   PLT  176 11/09/2021      Chemistry      Component Value Date/Time   NA 139 11/09/2021 1134   K 4.3 11/09/2021 1134   CL 103 11/09/2021 1134   CO2 29 11/09/2021 1134   BUN 13 11/09/2021 1134   CREATININE 0.69 11/09/2021 1134      Component Value Date/Time   CALCIUM 9.5 11/09/2021 1134   ALKPHOS 55 11/09/2021 1134   AST 18 11/09/2021 1134   ALT 22 11/09/2021 1134   BILITOT 0.4 11/09/2021 1134       RADIOGRAPHIC STUDIES: DG Chest Port 1 View  Result Date: 11/06/2021 CLINICAL DATA:  Congestive heart failure. EXAM: PORTABLE CHEST 1 VIEW COMPARISON:  October 24, 2021. FINDINGS: There is now noted complete opacification of the right hemithorax most likely due to pleural effusion and some degree of atelectasis. Pleural drainage catheter is noted in the right lung base. Right internal jugular Port-A-Cath is unchanged in position. Mediastinal shift to the left is noted. Left lung is clear. IMPRESSION: Complete opacification of the right hemithorax is now noted due to pleural effusion and some degree of atelectasis. Pleural drainage catheter is noted in right lung base. Electronically Signed   By: Marijo Conception M.D.   On: 11/06/2021 13:43   US Thoracentesis Asp Pleural space w/IMG guide  Result Date: 10/24/2021 INDICATION: Lung cancer with large right pleural effusion. Request therapeutic thoracentesis  EXAM: ULTRASOUND GUIDED RIGHT THORACENTESIS MEDICATIONS: 1% lidocaine 10 mL COMPLICATIONS: None immediate. PROCEDURE: An ultrasound guided thoracentesis was thoroughly discussed with the patient and questions answered. The benefits, risks, alternatives and complications were also discussed. The patient understands and wishes to proceed with the procedure. Written consent was obtained. Ultrasound was performed to localize and mark an adequate pocket of fluid in the right chest. The area was then prepped and draped in the normal sterile fashion. 1% Lidocaine was used for local anesthesia. Under  ultrasound guidance a 6 Fr Safe-T-Centesis catheter was introduced. Thoracentesis was performed. The catheter was removed and a dressing applied. FINDINGS: A total of approximately 2.2 L of thin red fluid was removed. IMPRESSION: Successful ultrasound guided right thoracentesis yielding 2.2 L of pleural fluid. Possible small ex vacuo type pneumothorax seen on post-procedure chest x-ray. Patient is asymptomatic. Procedure performed by: Gareth Eagle, PA-C Electronically Signed   By: Aletta Edouard M.D.   On: 10/24/2021 16:16   DG Chest 1 View  Result Date: 10/24/2021 CLINICAL DATA:  Post thoracentesis with 2.2 L removed from the RIGHT chest. EXAM: CHEST  1 VIEW COMPARISON:  October 18, 2021 FINDINGS: RIGHT-sided Port-A-Cath terminates in the mid SVC. Cardiomediastinal contours and hilar structures grossly stable and largely obscured in the RIGHT chest due to persistent large RIGHT-sided pleural effusion which appears to have diminished following thoracentesis. Potential small pneumothorax over the RIGHT upper chest mixed with pleural fluid and pleural disease. LEFT chest is clear. On limited assessment no acute skeletal process. IMPRESSION: 1. Persistent large RIGHT-sided pleural effusion which appears to have diminished following thoracentesis. Potential small pneumothorax over the RIGHT upper chest mixed with pleural fluid and pleural disease. Could consider further evaluation with expiratory view as warranted. If present this likely represents a small ex vacuo type pneumothorax. 2. LEFT chest is clear. These results were called by telephone at the time of interpretation on 10/24/2021 at 3:28 pm to provider [facility] , who verbally acknowledged these results. Electronically Signed   By: Zetta Bills M.D.   On: 10/24/2021 15:28    ASSESSMENT AND PLAN: This is a very pleasant 66 years old African-American male recently diagnosed with limited stage small cell lung cancer in February 2022.  The patient is  currently undergoing treatment according to the [place] clinical trial where the patient would be randomized to treatment with concurrent chemoradiation with cisplatin and/or etoposide versus concurrent chemoradiation with the same regimen in addition to atezolizumab. He is status post 3 cycles of the treatment.  His treatment was discontinued secondary to intolerance and the patient has been in observation since April 2022. He had MRI of the brain performed recently that showed no evidence of metastatic disease to the brain. Repeat CT scan of the chest, abdomen and pelvis unfortunately showed evidence for disease progression involving new mass centered in the superior aspect of the right major fissure, widespread nodularity in the adjacent portion of the upper right lung, multiple new pleural-based lesions in the right hemithorax, new middle mediastinal lymphadenopathy, new hypervascular lesion in the proximal body of the pancreas and new lytic lesions in the parasymphyseal region of the left to pubic bone. His insurance declined a PET scan. The patient is started palliative systemic chemotherapy with carboplatin for AUC of 5 on day 1, etoposide 100 Mg/M2 on days 1, 2 and 3 with Cosela 4 Myeloprotection before chemotherapy and Imfinzi 1500 Mg IV every 3 weeks with the induction phase followed by maintenance Imfinzi every 4 weeks  if the patient has no evidence for disease progression.  He is status post 5 cycles.  Starting from cycle #5 he will be on treatment with Imfinzi every 4 weeks.  His treatment was discontinued after cycle #5 secondary to disease progression. The patient is currently undergoing a third line treatment with Zepzelca (lurbinectedin) 3.2 Mg/M2 every 3 weeks status post 6 cycles.  He was admitted to the hospital after cycle #2 with significant chemotherapy-induced pancytopenia and neutropenic fever as well as left hip subcutaneous abscess.  The patient tolerated his previous treatment  with Zepzelca (lurbinectedin) fairly well but this was discontinued secondary to disease progression. He started third line treatment with irinotecan 65 Mg/M2 on days 1 and 8 every 3 weeks status post 1 cycle.  He tolerated the first cycle of this treatment well except for the baseline fatigue and weakness as well as weight loss secondary to lack of appetite. I recommended for the patient to proceed with cycle #2 today as planned. For the lack of appetite, I will start him on Medrol Dosepak. For the tachycardia secondary to dehydration, I will arrange for the patient to receive 1 L of normal saline in the clinic today. He will come back for follow-up visit in 3 weeks for evaluation before starting day 1 of cycle #3. For the chemotherapy-induced anemia, we will continue to monitor his hemoglobin and hematocrit closely and consider The patient for transfusion if his hemoglobin is less than 8.0G/DL. The patient was advised to call immediately if he has any other concerning symptoms in the interval. The patient voices understanding of current disease status and treatment options and is in agreement with the current care plan.  All questions were answered. The patient knows to call the clinic with any problems, questions or concerns. We can certainly see the patient much sooner if necessary.  The total time spent in the appointment was 30 minutes.  Disclaimer: This note was dictated with voice recognition software. Similar sounding words can inadvertently be transcribed and may not be corrected upon review.

## 2021-11-23 NOTE — Telephone Encounter (Signed)
Spoke with pt who verified that he has all the supplies he needs and isn't having any difficulties at this time. Pt was updated about home health situation and told to contact office if he needs any assistance. Nothing further needed at this time.   Routing to [employer] as [employer]

## 2021-11-24 ENCOUNTER — Encounter: Payer: Self-pay | Admitting: Nurse Practitioner

## 2021-11-27 ENCOUNTER — Telehealth: Payer: Self-pay | Admitting: *Deleted

## 2021-11-27 ENCOUNTER — Other Ambulatory Visit: Payer: Self-pay

## 2021-11-27 ENCOUNTER — Ambulatory Visit: Payer: Commercial Managed Care - HMO

## 2021-11-27 DIAGNOSIS — J910 Malignant pleural effusion: Secondary | ICD-10-CM

## 2021-11-27 NOTE — Telephone Encounter (Signed)
Please double book me tomorrow and have pt come in for further evaluation with repeat CXR beforehand. ED precautions should his breathing worsen or if he starts coughing up large amounts of blood/blood tinged sputum. Thanks!

## 2021-11-27 NOTE — Telephone Encounter (Signed)
Pt scheduled w/ [facility] tomorrow at 75 will arrive early for CXR.

## 2021-11-27 NOTE — Telephone Encounter (Signed)
Spoke with pt and Bobbi who states that when draining pt yesterday she got what appeared to be a bright red blood clot. Bobbi said clot then passed through line and pt appeared to be breathing easier and 750 ml was empty.  Bobbi got 350 ml this morning but pt was coughing this morning. Pt coughed up light pink mucus this morning. Pt states we do have permission to talk to [facility].

## 2021-11-27 NOTE — Telephone Encounter (Signed)
Ernestene Kiel for [name] [phone] would like to discuss with changes in his tube drainage.  States he does not have a fever and no pain but has been coughing a lot more and has had some pink mucus and would like some recommendations on next steps.  Please call Tammi Klippel [phone] with advice.

## 2021-11-28 ENCOUNTER — Emergency Department (HOSPITAL_COMMUNITY): Payer: Commercial Managed Care - HMO

## 2021-11-28 ENCOUNTER — Other Ambulatory Visit: Payer: Self-pay

## 2021-11-28 ENCOUNTER — Emergency Department (HOSPITAL_COMMUNITY)
Admission: EM | Admit: 2021-11-28 | Discharge: 2021-11-28 | Disposition: A | Discharge status: Home / Self Care | Payer: Commercial Managed Care - HMO | Attending: Emergency Medicine | Admitting: Emergency Medicine

## 2021-11-28 ENCOUNTER — Ambulatory Visit (INDEPENDENT_AMBULATORY_CARE_PROVIDER_SITE_OTHER): Payer: Commercial Managed Care - HMO

## 2021-11-28 ENCOUNTER — Ambulatory Visit (INDEPENDENT_AMBULATORY_CARE_PROVIDER_SITE_OTHER): Payer: Commercial Managed Care - HMO | Admitting: Nurse Practitioner

## 2021-11-28 ENCOUNTER — Encounter (HOSPITAL_COMMUNITY): Payer: Self-pay

## 2021-11-28 ENCOUNTER — Encounter: Payer: Self-pay | Admitting: Nurse Practitioner

## 2021-11-28 VITALS — BP 84/62 | HR 120 | Ht 69.0 in | Wt 153.0 lb

## 2021-11-28 DIAGNOSIS — J90 Pleural effusion, not elsewhere classified: Secondary | ICD-10-CM | POA: Diagnosis not present

## 2021-11-28 DIAGNOSIS — J910 Malignant pleural effusion: Secondary | ICD-10-CM | POA: Diagnosis not present

## 2021-11-28 DIAGNOSIS — C3411 Malignant neoplasm of upper lobe, right bronchus or lung: Secondary | ICD-10-CM

## 2021-11-28 DIAGNOSIS — C3491 Malignant neoplasm of unspecified part of right bronchus or lung: Secondary | ICD-10-CM | POA: Diagnosis not present

## 2021-11-28 DIAGNOSIS — Z87891 Personal history of nicotine dependence: Secondary | ICD-10-CM

## 2021-11-28 DIAGNOSIS — Z95828 Presence of other vascular implants and grafts: Secondary | ICD-10-CM | POA: Diagnosis not present

## 2021-11-28 DIAGNOSIS — R000 Tachycardia, unspecified: Secondary | ICD-10-CM | POA: Diagnosis present

## 2021-11-28 LAB — BASIC METABOLIC PANEL
Anion gap: 12 (ref 5–15)
BUN: 12 mg/dL (ref 8–23)
CO2: 27 mmol/L (ref 22–32)
Calcium: 9.9 mg/dL (ref 8.9–10.3)
Chloride: 100 mmol/L (ref 98–111)
Creatinine, Ser: 0.77 mg/dL (ref 0.61–1.24)
GFR, Estimated: >60 mL/min (ref >60)
Glucose, Bld: 100 mg/dL — ABNORMAL HIGH (ref 70–99)
Potassium: 4.7 mmol/L (ref 3.5–5.1)
Sodium: 139 mmol/L (ref 135–145)

## 2021-11-28 LAB — CBC WITH DIFFERENTIAL/PLATELET
Abs Immature Granulocytes: 0.05 10*3/uL (ref 0.00–0.07)
Basophils Absolute: 0 10*3/uL (ref 0.0–0.1)
Basophils Relative: 1 %
Eosinophils Absolute: 0 10*3/uL (ref 0.0–0.5)
Eosinophils Relative: 1 %
HCT: 27.7 % — ABNORMAL LOW (ref 39.0–52.0)
Hemoglobin: 8.6 g/dL — ABNORMAL LOW (ref 13.0–17.0)
Immature Granulocytes: 2 %
Lymphocytes Relative: 12 %
Lymphs Abs: 0.4 10*3/uL — ABNORMAL LOW (ref 0.7–4.0)
MCH: 30.1 pg (ref 26.0–34.0)
MCHC: 31 g/dL (ref 30.0–36.0)
MCV: 96.9 fL (ref 80.0–100.0)
Monocytes Absolute: 0.5 10*3/uL (ref 0.1–1.0)
Monocytes Relative: 14 %
Neutro Abs: 2.4 10*3/uL (ref 1.7–7.7)
Neutrophils Relative %: 70 %
Platelets: 137 10*3/uL — ABNORMAL LOW (ref 150–400)
RBC: 2.86 MIL/uL — ABNORMAL LOW (ref 4.22–5.81)
RDW: 18.9 % — ABNORMAL HIGH (ref 11.5–15.5)
WBC: 3.4 10*3/uL — ABNORMAL LOW (ref 4.0–10.5)
nRBC: 0.9 % — ABNORMAL HIGH (ref 0.0–0.2)

## 2021-11-28 LAB — I-STAT CHEM 8, ED
BUN: 14 mg/dL (ref 8–23)
Calcium, Ion: 1.29 mmol/L (ref 1.15–1.40)
Chloride: 100 mmol/L (ref 98–111)
Creatinine, Ser: 0.6 mg/dL — ABNORMAL LOW (ref 0.61–1.24)
Glucose, Bld: 95 mg/dL (ref 70–99)
HCT: 26 % — ABNORMAL LOW (ref 39.0–52.0)
Hemoglobin: 8.8 g/dL — ABNORMAL LOW (ref 13.0–17.0)
Potassium: 4.7 mmol/L (ref 3.5–5.1)
Sodium: 137 mmol/L (ref 135–145)
TCO2: 27 mmol/L (ref 22–32)

## 2021-11-28 LAB — BRAIN NATRIURETIC PEPTIDE: B Natriuretic Peptide: 217.8 pg/mL — ABNORMAL HIGH (ref 0.0–100.0)

## 2021-11-28 MED ORDER — IOHEXOL 350 MG/ML SOLN
75.0000 mL | Freq: Once | INTRAVENOUS | Status: AC | PRN
  Administered 2021-11-28: 75 mL via INTRAVENOUS

## 2021-11-28 MED ORDER — SODIUM CHLORIDE 0.9 % IV BOLUS
1000.0000 mL | Freq: Once | INTRAVENOUS | Status: AC
  Administered 2021-11-28: 1000 mL via INTRAVENOUS

## 2021-11-28 MED FILL — Dexamethasone Sodium Phosphate Inj 100 MG/10ML: INTRAMUSCULAR | Qty: 1 | Status: AC

## 2021-11-28 NOTE — Assessment & Plan Note (Signed)
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  He was clinically improving with good output at previous follow-up.  Unfortunately over the last few days, he has developed a increased productive cough with pink frothy sputum, worsening dyspnea and fatigue.  He is hypotensive and tachycardic in office today.  CXR did show some improvement in aeration of the right lung (compared to 10/30) although still with a very large right pleural effusion.  Concerned that there could be another potential underlying cause including infection, decompensated heart failure or pulmonary embolism.  Given his decline in current status, he is unsafe for further outpatient treatment.  Recommended transport via EMS to the hospital for admission.  He was agreeable to this plan.  [facility] aware of impending admission.

## 2021-11-28 NOTE — Progress Notes (Signed)
_0  ID: Adam [name], male    DOB: 02-14-1955, 66 y.o.   MRN: [id number]  Chief Complaint  Patient presents with   Follow-up    Pt f/u he is having less output, partner stated that he had what looked like a clot yesterday.     Referring provider: [place], Blondell Reveal, MD  HPI: 66 year old male, former smoker followed for malignant pleural effusion.  He has a history of small cell lung cancer diagnosed 02/2020, treated with chemoradiation.  He has had recurrent/progressive disease, despite even third line therapy.  Most recent course was complicated by large right pleural effusion requiring serial thoracenteses.  He is a patient of Dr. Agustina Caroli and last seen in office 11/16/2021 by Belenda Cruise NP.  Past medical history significant for hypertension, chemo induced neutropenia, inguinal hernia.  TEST/EVENTS:  03/09/2020 PFT: FVC 91, FEV1 91, ratio 78, TLC 87, DLCOcor 60. No BD  11/02/2021: OV with Dr. Lamonte Sakai.  Large right pleural effusion with associated dyspnea.  He is failing chemotherapy (third line regimen).  Reasonable to offer Pleurx drainage catheter for dyspnea control, palliation.  Agreed with procedure; set up 10/30 with Dr. Valeta Harms.  11/16/2021: OV with Kamar Callender NP for follow-up after having Pleurx drain placed by Dr. Valeta Harms on 10/30. He has recovered well since. Breathing feels better; still gets short winded with longer. Minimal dry cough, which is improved. Denies fevers, chills, increased chest congestion. His ex-wife has been assisting him at home and is his caregiver. She has excellent knowledge surrounding proper care of device and drainage. She has been draining it daily, getting between 250-850 mL's out. Last time they drained it was this morning; 500 mL. They did use the last drainage kit this morning. Otherwise, no concerns or complaints today.   11/28/2021: Today - acute Patient presents today for acute visit.  He is with his ex-spouse who is his caregiver.  She called into the office on  11/20 with reports of increased cough with pink frothy sputum.  He was drained on 11/19 with a very large clot followed by 750 mL of output.  Felt much better after this.  Yesterday, 11/20, he had only gotten 350 out and no further clots.  Noticed that his cough was worse.  He was advised to come in for further evaluation.  Today he reports that he has been feeling very fatigued and weak.  His breathing is worse compared to his baseline.  He gets dyspneic even with minimal activity such as going to the bathroom.  Feels okay if he is sitting without doing anything.  Cough gets worse at night.  Coughing up pink frothy sputum.  Notices some occasional wheezing.  Denies any fevers, chills, lower extremity swelling, PND, palpitations.  No lightheadedness or dizziness.  No Known Allergies   There is no immunization history on file for this patient.  Past Medical History:  Diagnosis Date   Hypertension    Lung cancer ([place])    small cell RUL    Tobacco History: Social History   Tobacco Use  Smoking Status Former   Packs/day: 1.00   Years: 20.00   Total pack years: 20.00   Types: Cigarettes   Quit date: 06/09/2018   Years since quitting: 3.4  Smokeless Tobacco Never   Counseling given: Not Answered   Outpatient Medications Prior to Visit  Medication Sig Dispense Refill   acetaminophen (TYLENOL) 650 MG CR tablet Take 650 mg by mouth every 8 (eight) hours as needed for pain.  bisacodyl (DULCOLAX) 5 MG EC tablet Take 5 mg by mouth daily as needed for moderate constipation.     lidocaine-prilocaine (EMLA) cream Apply 1 application topically as needed. (Patient taking differently: Apply 1 application  topically as needed (to access port).) 30 g 2   methylPREDNISolone (MEDROL DOSEPAK) 4 MG TBPK tablet Use as instructed. 21 tablet 0   mirtazapine (REMERON) 15 MG tablet Take 2 tablets (30 mg total) by mouth at bedtime. 30 tablet 2   No facility-administered medications prior to visit.      Review of Systems:   Constitutional: No night sweats, fevers, chills. +weight loss, fatigue, lassitude (increased) HEENT: No headaches, difficulty swallowing, tooth/dental problems, or sore throat. No sneezing, itching, ear ache, nasal congestion, or post nasal drip CV:  + Orthopnea.  No chest pain, PND, swelling in lower extremities, anasarca, dizziness, palpitations, syncope Resp: + shortness of breath with exertion; productive cough with frothy pink sputum; minimal wheezing. No excess mucus or change in color of mucus.   No chest wall deformity GI:  No heartburn, indigestion, abdominal pain, nausea, vomiting, diarrhea Skin: No rash, lesions, ulcerations MSK:  No joint pain or swelling.   Neuro: No dizziness or lightheadedness.  Psych: No depression or anxiety. Mood stable.     Physical Exam:  BP (!) 84/62   Pulse (!) 120   Ht _0  (1.753 m)   Wt 153 lb (69.4 kg)   SpO2 92%   BMI 22.59 kg/m   GEN: Pleasant, interactive, well-kempt; in no acute distress HEENT:  Normocephalic and atraumatic. PERRLA. Sclera white. Nasal turbinates pink, moist and patent bilaterally. No rhinorrhea present. Oropharynx pink and moist, without exudate or edema. No lesions, ulcerations, or postnasal drip.  NECK:  Supple w/ fair ROM. No JVD present. Normal carotid impulses w/o bruits. Thyroid symmetrical with no goiter or nodules palpated. No lymphadenopathy.   CV: RRR, no m/r/g, no peripheral edema. Pulses intact, +2 bilaterally. No cyanosis, pallor or clubbing. PULMONARY:  Unlabored, regular breathing.  Diminished right breath sounds. No accessory muscle use. Right sided PleurX in place; skin clean and dry without redness or exudate. GI: BS present and normoactive. Soft, non-tender to palpation. No organomegaly or masses detected.  MSK: No erythema, warmth or tenderness. Cap refil <2 sec all extrem. No deformities or joint swelling noted.  Neuro: A/Ox3. No focal deficits noted.   Skin: Warm, no  lesions or rashe Psych: Normal affect and behavior. Judgement and thought content appropriate.     Lab Results:  CBC    Component Value Date/Time   WBC 5.5 11/22/2021 0845   WBC 4.9 06/02/2021 0444   RBC 3.29 (L) 11/22/2021 0845   HGB 9.9 (L) 11/22/2021 0845   HCT 31.0 (L) 11/22/2021 0845   PLT 169 11/22/2021 0845   MCV 94.2 11/22/2021 0845   MCH 30.1 11/22/2021 0845   MCHC 31.9 11/22/2021 0845   RDW 19.2 (H) 11/22/2021 0845   LYMPHSABS 0.5 (L) 11/22/2021 0845   MONOABS 0.7 11/22/2021 0845   EOSABS 0.0 11/22/2021 0845   BASOSABS 0.0 11/22/2021 0845    BMET    Component Value Date/Time   NA 138 11/22/2021 0845   K 4.5 11/22/2021 0845   CL 100 11/22/2021 0845   CO2 28 11/22/2021 0845   GLUCOSE 103 (H) 11/22/2021 0845   BUN 15 11/22/2021 0845   CREATININE 0.81 11/22/2021 0845   CALCIUM 10.4 (H) 11/22/2021 0845   GFRNONAA >60 11/22/2021 0845    BNP No results found  for: "BNP"   Imaging:  DG Chest 2 View  Result Date: 11/28/2021 CLINICAL DATA:  Malignant pleural effusion. EXAM: CHEST - 2 VIEW COMPARISON:  11/06/2021 FINDINGS: Slight decrease in right pleural effusion with evidence of aerated right lung on the current study. There is a persistent large pleural effusion with right pleural drain still in place. Right Port-A-Cath again noted. Left lung remains clear. The cardiopericardial silhouette is within normal limits for size. The visualized bony structures of the thorax are unremarkable. IMPRESSION: Slight decrease in right pleural effusion with evidence of some aerated right lung on the current study. Electronically Signed   By: Misty Stanley M.D.   On: 11/28/2021 11:49   DG Chest Port 1 View  Result Date: 11/06/2021 CLINICAL DATA:  Congestive heart failure. EXAM: PORTABLE CHEST 1 VIEW COMPARISON:  October 24, 2021. FINDINGS: There is now noted complete opacification of the right hemithorax most likely due to pleural effusion and some degree of atelectasis.  Pleural drainage catheter is noted in the right lung base. Right internal jugular Port-A-Cath is unchanged in position. Mediastinal shift to the left is noted. Left lung is clear. IMPRESSION: Complete opacification of the right hemithorax is now noted due to pleural effusion and some degree of atelectasis. Pleural drainage catheter is noted in right lung base. Electronically Signed   By: Marijo Conception M.D.   On: 11/06/2021 13:43    acetaminophen (TYLENOL) tablet 650 mg     Date Action Dose Route User   Discharged on 11/06/2021   Admitted on 11/06/2021   11/02/2021 5176 Given 650 mg Oral Derrick Ravel, RN      atropine injection 0.5 mg     Date Action Dose Route User   Discharged on 11/06/2021   Admitted on 11/06/2021   11/01/2021 1635 Given 0.5 mg Intravenous Alvera Singh, RN      atropine injection 0.5 mg     Date Action Dose Route User   11/09/2021 1323 Given 0.5 mg Intravenous Willis Modena, RN      atropine injection 0.5 mg     Date Action Dose Route User   11/22/2021 1052 Given 0.5 mg Intravenous Kasandra Knudsen A, RN      dexamethasone (DECADRON) 10 mg in sodium chloride 0.9 % 50 mL IVPB     Date Action Dose Route User   Discharged on 11/06/2021   Admitted on 11/06/2021   11/01/2021 1552 Rate/Dose Change (none) Intravenous Jesse Fall, RN   11/01/2021 1552 Infusion Verify (none) Intravenous Jesse Fall, RN   11/01/2021 1552 New Bag/Given 10 mg Intravenous Alvera Singh, RN      dexamethasone (DECADRON) 10 mg in sodium chloride 0.9 % 50 mL IVPB     Date Action Dose Route User   11/09/2021 1234 Rate/Dose Change (none) Intravenous Willis Modena, RN   11/09/2021 1233 New Bag/Given 10 mg Intravenous Rolene Course, RN      dexamethasone (DECADRON) 10 mg in sodium chloride 0.9 % 50 mL IVPB     Date Action Dose Route User   11/22/2021 1004 Rate/Dose Change (none) Intravenous Tildon Husky, RN   11/22/2021 1004 New Bag/Given 10  mg Intravenous Tildon Husky, RN      diphenhydrAMINE (BENADRYL) capsule 25 mg     Date Action Dose Route User   Discharged on 11/06/2021   Admitted on 11/06/2021   11/02/2021 1607 Given 25 mg Oral Derrick Ravel, RN      heparin lock  flush 100 unit/mL     Date Action Dose Route User   Discharged on 11/06/2021   Admitted on 11/06/2021   11/01/2021 1838 Given 500 Units Intracatheter Jesse Fall, RN      heparin lock flush 100 unit/mL     Date Action Dose Route User   Discharged on 11/06/2021   Admitted on 11/06/2021   11/02/2021 1354 Given 500 Units Intracatheter Derrick Ravel, RN      heparin lock flush 100 unit/mL     Date Action Dose Route User   11/09/2021 1504 Given 500 Units Intracatheter Willis Modena, RN      irinotecan (CAMPTOSAR) 120 mg in sodium chloride 0.9 % 500 mL chemo infusion     Date Action Dose Route User   Discharged on 11/06/2021   Admitted on 11/06/2021   11/01/2021 1642 Infusion Verify (none) Intravenous Jesse Fall, RN   11/01/2021 1642 New Bag/Given 120 mg Intravenous Alvera Singh, RN      irinotecan (CAMPTOSAR) 120 mg in sodium chloride 0.9 % 500 mL chemo infusion     Date Action Dose Route User   11/09/2021 1328 Infusion Verify (none) Intravenous Willis Modena, RN   11/09/2021 1328 Rate/Dose Change (none) Intravenous Willis Modena, RN   11/09/2021 1328 New Bag/Given 120 mg Intravenous Willis Modena, RN      irinotecan (CAMPTOSAR) 120 mg in sodium chloride 0.9 % 500 mL chemo infusion     Date Action Dose Route User   11/22/2021 1228 Rate/Dose Change (none) Intravenous Tildon Husky, RN   11/22/2021 1227 Rate/Dose Change (none) Intravenous Tildon Husky, RN   11/22/2021 1057 Rate/Dose Change (none) Intravenous Tildon Husky, RN   11/22/2021 1057 New Bag/Given 120 mg Intravenous Tildon Husky, RN      palonosetron (ALOXI) injection 0.25 mg     Date Action Dose Route User    Discharged on 11/06/2021   Admitted on 11/06/2021   11/01/2021 1550 Given 0.25 mg Intravenous Alvera Singh, RN      palonosetron (ALOXI) injection 0.25 mg     Date Action Dose Route User   11/09/2021 1228 Given 0.25 mg Intravenous Rolene Course, RN      palonosetron (ALOXI) injection 0.25 mg     Date Action Dose Route User   11/22/2021 0959 Given 0.25 mg Intravenous Tildon Husky, RN      0.9 %  sodium chloride infusion     Date Action Dose Route User   Discharged on 11/06/2021   Admitted on 11/06/2021   11/01/2021 1832 Rate/Dose Change (none) Intravenous Jesse Fall, RN   11/01/2021 1826 Rate/Dose Change (none) Intravenous Jesse Fall, RN   11/01/2021 1801 Infusion Verify (none) Intravenous Jesse Fall, RN   11/01/2021 1801 Infusion Verify (none) Intravenous Jesse Fall, RN   11/01/2021 1646 Infusion Verify (none) Intravenous Jesse Fall, RN      0.9 %  sodium chloride infusion     Date Action Dose Route User   11/09/2021 1504 Rate/Dose Change (none) Intravenous Willis Modena, RN   11/09/2021 1328 Rate/Dose Change (none) Intravenous Willis Modena, RN   11/09/2021 1324 Rate/Dose Change (none) Intravenous Willis Modena, RN   11/09/2021 1323 Infusion Verify (none) Intravenous Willis Modena, RN   11/09/2021 1256 Infusion Verify (none) Intravenous Willis Modena, RN      0.9 %  sodium chloride infusion     Date Action Dose Route User   11/22/2021 1233 Rate/Dose  Change (none) Intravenous Tildon Husky, RN   11/22/2021 1144 Rate/Dose Change (none) Intravenous Tildon Husky, RN   11/22/2021 1140 Rate/Dose Change (none) Intravenous Tildon Husky, RN   11/22/2021 1140 Rate/Dose Change (none) Intravenous Tildon Husky, RN   11/22/2021 0940 New Bag/Given (none) Intravenous Kasandra Knudsen A, RN      sodium chloride flush (NS) 0.9 % injection 10 mL     Date Action Dose Route User   Discharged on 11/06/2021   Admitted on  11/06/2021   10/19/2021 1023 Given 10 mL Intracatheter Anastasia Pall, RN      sodium chloride flush (NS) 0.9 % injection 10 mL     Date Action Dose Route User   Discharged on 11/06/2021   Admitted on 11/06/2021   11/01/2021 1308 Given 10 mL Intracatheter Anastasia Pall, RN      sodium chloride flush (NS) 0.9 % injection 10 mL     Date Action Dose Route User   Discharged on 11/06/2021   Admitted on 11/06/2021   11/01/2021 1834 Given 10 mL Intracatheter Jesse Fall, RN      sodium chloride flush (NS) 0.9 % injection 10 mL     Date Action Dose Route User   Discharged on 11/06/2021   Admitted on 11/06/2021   11/02/2021 1354 Given 10 mL Intracatheter Derrick Ravel, RN      sodium chloride flush (NS) 0.9 % injection 10 mL     Date Action Dose Route User   11/09/2021 1126 Given 10 mL Intracatheter Gobble, Jasmine A, LPN      sodium chloride flush (NS) 0.9 % injection 10 mL     Date Action Dose Route User   11/09/2021 1504 Given 10 mL Intracatheter Willis Modena, RN      sodium chloride flush (NS) 0.9 % injection 10 mL     Date Action Dose Route User   11/22/2021 0844 Given 10 mL Intracatheter Anastasia Pall, RN      0.9 %  sodium chloride infusion (Manually program via Guardrails IV Fluids)     Date Action Dose Route User   Discharged on 11/06/2021   Admitted on 11/06/2021   11/02/2021 0901 New Bag/Given 250 mL Intravenous Derrick Ravel, RN          Latest Ref Rng & Units 03/09/2020    9:06 AM  PFT Results  FVC-Pre L 3.47   FVC-Predicted Pre % 91   FVC-Post L 3.40   FVC-Predicted Post % 89   Pre FEV1/FVC % % 78   Post FEV1/FCV % % 78   FEV1-Pre L 2.69   FEV1-Predicted Pre % 91   FEV1-Post L 2.65   DLCO uncorrected ml/min/mmHg 14.98   DLCO UNC% % 56   DLCO corrected ml/min/mmHg 15.96   DLCO COR %Predicted % 60   DLVA Predicted % 84   TLC L 5.98   TLC % Predicted % 87   RV % Predicted % 109     No results found for:  "NITRICOXIDE"      Assessment & Plan:   Malignant pleural effusion Malignant pleural effusion status post Pleurx placement on 10/30.  He was clinically improving with good output at previous follow-up.  Unfortunately over the last few days, he has developed a increased productive cough with pink frothy sputum, worsening dyspnea and fatigue.  He is hypotensive and tachycardic in office today.  CXR did show some improvement in aeration of the right lung (compared  to 10/30) although still with a very large right pleural effusion.  Concerned that there could be another potential underlying cause including infection, decompensated heart failure or pulmonary embolism.  Given his decline in current status, he is unsafe for further outpatient treatment.  Recommended transport via EMS to the hospital for admission.  He was agreeable to this plan.  Jauca aware of impending admission.   I spent 45 minutes of dedicated to the care of this patient on the date of this encounter to include pre-visit review of records, face-to-face time with the patient discussing conditions above, post visit ordering of testing, clinical documentation with the electronic health record, making appropriate referrals as documented, and communicating necessary findings to members of the patients care team.  Clayton Bibles, NP 11/28/2021  Pt aware and understands NP's role.

## 2021-11-28 NOTE — Discharge Instructions (Signed)
Get help right away if: You are short of breath. You develop chest pain. You develop a new cough. These symptoms may be an emergency. Get help right away. Call 911. Do not wait to see if the symptoms will go away. Do not drive yourself to the hospital.

## 2021-11-28 NOTE — ED Triage Notes (Signed)
Coming from pulmonary MD for weakness. Tachycardia, and hypotension in the MD office ES denies any for him..   being treated for stage 4 lung cancer.  Has a drainage tube to right lateral side for drainage.  Usually puts out 250 ml daily with exception of yesterday.and pulled some clots out.  Decreased right sided lung sounds denies CP

## 2021-11-28 NOTE — ED Provider Notes (Signed)
[facility]ENC[facility] DEPARTMENT Provider Note   CSN: [id number] Arrival date & time: 11/28/21  1251     History  Chief Complaint  Patient presents with   Weakness   Irregular Heart Beat   Hypotension    Adam [name] is a 66 y.o. male, former smoker with recurrent small cell lung carcinoma with progressive disease.  He has a known large right pleural effusion and a Pleurx catheter in place.  Patient was sent to the emergency department from his pulmonology office.  He was at the pulmonology office today because yesterday his caretaker/ex-wife removed what she thought was a clot from his Pleurx catheter drain.  She states that he is also had decreased output from the catheter site he was noted to be mildly tachycardic and mildly hypotensive in the office.  Due to these findings he was sent to the emergency department to rule out underlying cause such as pulmonary embolus, CHF, Monia.  Patient states that he feels like he is at his baseline which is not great.  He denies any increase shortness of breath fevers or chills   Weakness      Home Medications Prior to Admission medications   Medication Sig Start Date End Date Taking? Authorizing Provider  acetaminophen (TYLENOL) 650 MG CR tablet Take 650 mg by mouth every 8 (eight) hours as needed for pain.    [provider]  bisacodyl (DULCOLAX) 5 MG EC tablet Take 5 mg by mouth daily as needed for moderate constipation.    [provider]  lidocaine-prilocaine (EMLA) cream Apply 1 application topically as needed. Patient taking differently: Apply 1 application  topically as needed (to access port). 01/11/21   Heilingoetter, Cassandra L, PA-C  methylPREDNISolone (MEDROL DOSEPAK) 4 MG TBPK tablet Use as instructed. 11/22/21   Curt Bears, MD  mirtazapine (REMERON) 15 MG tablet Take 2 tablets (30 mg total) by mouth at bedtime. 11/09/21   Heilingoetter, Cassandra L, PA-C      Allergies    Patient has  no known allergies.    Review of Systems   Review of Systems  Neurological:  Positive for weakness.    Physical Exam Updated Vital Signs BP 104/70 (BP Location: Right Arm)   Pulse (!) 115   Temp 98.1 F (36.7 C) (Oral)   Resp 16   Ht 5\' 9"  (1.753 m)   Wt 69.4 kg   SpO2 96%   BMI 22.59 kg/m  Physical Exam Vitals and nursing note reviewed.  Constitutional:      General: He is not in acute distress.    Appearance: He is well-developed. He is not diaphoretic.  HENT:     Head: Normocephalic and atraumatic.  Eyes:     General: No scleral icterus.    Conjunctiva/sclera: Conjunctivae normal.  Cardiovascular:     Rate and Rhythm: Normal rate and regular rhythm.     Heart sounds: Normal heart sounds.  Pulmonary:     Effort: Tachypnea present. No respiratory distress.     Breath sounds: Decreased air movement present. Examination of the right-upper field reveals decreased breath sounds. Examination of the right-middle field reveals decreased breath sounds. Examination of the right-lower field reveals decreased breath sounds. Decreased breath sounds present.  Abdominal:     Palpations: Abdomen is soft.     Tenderness: There is no abdominal tenderness.  Musculoskeletal:     Cervical back: Normal range of motion and neck supple.     Right lower leg: No edema.  Left lower leg: No edema.  Skin:    General: Skin is warm and dry.  Neurological:     Mental Status: He is alert.  Psychiatric:        Behavior: Behavior normal.     ED Results / Procedures / Treatments   Labs (all labs ordered are listed, but only abnormal results are displayed) Labs Reviewed  BASIC METABOLIC PANEL  CBC WITH DIFFERENTIAL/PLATELET  BRAIN NATRIURETIC PEPTIDE  I-STAT CHEM 8, ED    EKG None  Radiology DG Chest 2 View  Result Date: 11/28/2021 CLINICAL DATA:  Malignant pleural effusion. EXAM: CHEST - 2 VIEW COMPARISON:  11/06/2021 FINDINGS: Slight decrease in right pleural effusion with  evidence of aerated right lung on the current study. There is a persistent large pleural effusion with right pleural drain still in place. Right Port-A-Cath again noted. Left lung remains clear. The cardiopericardial silhouette is within normal limits for size. The visualized bony structures of the thorax are unremarkable. IMPRESSION: Slight decrease in right pleural effusion with evidence of some aerated right lung on the current study. Electronically Signed   By: Misty Stanley M.D.   On: 11/28/2021 11:49    Procedures Procedures    Medications Ordered in ED Medications - No data to display  ED Course/ Medical Decision Making/ A&P                           Medical Decision Making Patient is a 67 emergency department for poor output from his Pleurx catheter.  He has known small cell lung carcinoma and is currently still receiving chemotherapy.  He denies any worsening in his shortness of breath. I reviewed the patient's lab.  He has a low hemoglobin but it is about baseline.  BMP shows mildly elevated blood glucose of insignificant value.  BNP is only mildly elevated and does not represent any significant value. I ordered visualized and interpreted CT angiogram of the chest which showed a fairly massive cancer burden on the right side.  His pleural effusion is significantly improved and this is very likely the cause of his decreased output.  No evidence of pulmonary embolus.  I discussed all findings with the patient and his caregiver at bedside.  He is grateful to be able to go home today and has no complaints.  Amount and/or Complexity of Data Reviewed Labs: ordered. Radiology: ordered.  Risk Prescription drug management.           Final Clinical Impression(s) / ED Diagnoses Final diagnoses:  None    Rx / DC Orders ED Discharge Orders     None         Margarita Mail, PA-C 11/28/21 1632    Isla Pence, MD 11/29/21 [phone]

## 2021-11-29 ENCOUNTER — Other Ambulatory Visit: Payer: Self-pay | Admitting: Physician Assistant

## 2021-11-29 ENCOUNTER — Telehealth: Payer: Self-pay | Admitting: Medical Oncology

## 2021-11-29 ENCOUNTER — Inpatient Hospital Stay: Payer: Commercial Managed Care - HMO

## 2021-11-29 ENCOUNTER — Telehealth: Payer: Self-pay

## 2021-11-29 ENCOUNTER — Inpatient Hospital Stay (HOSPITAL_BASED_OUTPATIENT_CLINIC_OR_DEPARTMENT_OTHER): Payer: Commercial Managed Care - HMO | Admitting: Nurse Practitioner

## 2021-11-29 ENCOUNTER — Inpatient Hospital Stay (HOSPITAL_BASED_OUTPATIENT_CLINIC_OR_DEPARTMENT_OTHER): Payer: Commercial Managed Care - HMO | Admitting: Internal Medicine

## 2021-11-29 ENCOUNTER — Encounter: Payer: Self-pay | Admitting: Nurse Practitioner

## 2021-11-29 VITALS — BP 107/67 | HR 116 | Temp 98.6°F | Resp 20 | Ht 69.0 in | Wt 154.2 lb

## 2021-11-29 DIAGNOSIS — R630 Anorexia: Secondary | ICD-10-CM

## 2021-11-29 DIAGNOSIS — Z515 Encounter for palliative care: Secondary | ICD-10-CM

## 2021-11-29 DIAGNOSIS — C3411 Malignant neoplasm of upper lobe, right bronchus or lung: Secondary | ICD-10-CM

## 2021-11-29 DIAGNOSIS — R530 Neoplastic (malignant) related fatigue: Secondary | ICD-10-CM

## 2021-11-29 DIAGNOSIS — D649 Anemia, unspecified: Secondary | ICD-10-CM

## 2021-11-29 DIAGNOSIS — R531 Weakness: Secondary | ICD-10-CM

## 2021-11-29 DIAGNOSIS — Z7189 Other specified counseling: Secondary | ICD-10-CM | POA: Diagnosis not present

## 2021-11-29 DIAGNOSIS — C7951 Secondary malignant neoplasm of bone: Secondary | ICD-10-CM | POA: Diagnosis not present

## 2021-11-29 DIAGNOSIS — R634 Abnormal weight loss: Secondary | ICD-10-CM | POA: Diagnosis not present

## 2021-11-29 DIAGNOSIS — Z5111 Encounter for antineoplastic chemotherapy: Secondary | ICD-10-CM | POA: Diagnosis not present

## 2021-11-29 LAB — CBC WITH DIFFERENTIAL (CANCER CENTER ONLY)
Abs Immature Granulocytes: 0.02 10*3/uL (ref 0.00–0.07)
Basophils Absolute: 0 10*3/uL (ref 0.0–0.1)
Basophils Relative: 0 %
Eosinophils Absolute: 0 10*3/uL (ref 0.0–0.5)
Eosinophils Relative: 0 %
HCT: 25.6 % — ABNORMAL LOW (ref 39.0–52.0)
Hemoglobin: 8 g/dL — ABNORMAL LOW (ref 13.0–17.0)
Immature Granulocytes: 1 %
Lymphocytes Relative: 17 %
Lymphs Abs: 0.5 10*3/uL — ABNORMAL LOW (ref 0.7–4.0)
MCH: 30 pg (ref 26.0–34.0)
MCHC: 31.3 g/dL (ref 30.0–36.0)
MCV: 95.9 fL (ref 80.0–100.0)
Monocytes Absolute: 0.6 10*3/uL (ref 0.1–1.0)
Monocytes Relative: 19 %
Neutro Abs: 2 10*3/uL (ref 1.7–7.7)
Neutrophils Relative %: 63 %
Platelet Count: 127 10*3/uL — ABNORMAL LOW (ref 150–400)
RBC: 2.67 MIL/uL — ABNORMAL LOW (ref 4.22–5.81)
RDW: 19.2 % — ABNORMAL HIGH (ref 11.5–15.5)
WBC Count: 3.1 10*3/uL — ABNORMAL LOW (ref 4.0–10.5)
nRBC: 1.6 % — ABNORMAL HIGH (ref 0.0–0.2)

## 2021-11-29 LAB — CMP (CANCER CENTER ONLY)
ALT: 30 U/L (ref 0–44)
AST: 33 U/L (ref 15–41)
Albumin: 2.4 g/dL — ABNORMAL LOW (ref 3.5–5.0)
Alkaline Phosphatase: 76 U/L (ref 38–126)
Anion gap: 9 (ref 5–15)
BUN: 16 mg/dL (ref 8–23)
CO2: 26 mmol/L (ref 22–32)
Calcium: 9.7 mg/dL (ref 8.9–10.3)
Chloride: 103 mmol/L (ref 98–111)
Creatinine: 0.7 mg/dL (ref 0.61–1.24)
GFR, Estimated: >60 mL/min (ref >60)
Glucose, Bld: 101 mg/dL — ABNORMAL HIGH (ref 70–99)
Potassium: 4.4 mmol/L (ref 3.5–5.1)
Sodium: 138 mmol/L (ref 135–145)
Total Bilirubin: 0.5 mg/dL (ref 0.3–1.2)
Total Protein: 6.3 g/dL — ABNORMAL LOW (ref 6.5–8.1)

## 2021-11-29 LAB — PREPARE RBC (CROSSMATCH)

## 2021-11-29 LAB — SAMPLE TO BLOOD BANK

## 2021-11-29 NOTE — Telephone Encounter (Signed)
Pt referred to [facility].

## 2021-11-29 NOTE — Progress Notes (Signed)
[place] [phone]   [phone]  OFFICE PROGRESS NOTE  de [place], [name], MD [address] [place] [address]  DIAGNOSIS: Metastatic small cell lung cancer initially diagnosed as Limited stage (T1c, N2, M0) small cell lung cancer diagnosed in February 2022.  PRIOR THERAPY:  1) Systemic chemotherapy according to phase 3 randomized clinical trial of chemoradiation versus chemoradiation plus atezolizumab status post 3 cycles.  This treatment was discontinued secondary to intolerance. 2) palliative radiotherapy to the left hip area under the care of Dr. Tammi Klippel completed January 03, 2021. 3) Second line systemic chemotherapy with carboplatin for AUC of 5 on day 1, etoposide 100 Mg/M2 on days 1, 2 and 3 as well as Imfinzi 1500 Mg IV on day 1 and Cosela 240 Mg/M2 before chemotherapy on days 1, 2 and 3 every 3 weeks.  First dose December 13, 2020.  Status post 5 cycles.  Starting from cycle #5 the patient will be on maintenance treatment with Imfinzi every 4 weeks.  This was discontinued secondary to disease progression 4)Third line systemic chemotherapy with Zepzelca (lurbinectedin) 3.2 Mg/M2 IV every 3 weeks.  First dose 04/19/2021.  Status post 6 cycles.  The dose of Zepzelca (lurbinectedin) was reduced to 2.6 Mg/M2 starting from cycle #3. 5) Palliative systemic chemotherapy with Irinotecan on days 1 and 8 IV every 3 weeks.  First dose expected on 11/01/2021. Status post 1 cycle and day 1 of cycle #2.  This was discontinued secondary to disease progression   CURRENT THERAPY: Hospice and palliative care.  INTERVAL HISTORY: Adam [name] 66 y.o. male returns to the clinic today for follow-up visit accompanied by his wife.  The patient is complaining of increasing fatigue and weakness as well as shortness of breath.  He was seen at the emergency department yesterday because of his shortness of breath and repeat CT angiogram of the chest showed  further disease progression.  He denied having any current nausea, vomiting, diarrhea or constipation.  He has no hemoptysis.  He has no fever or chills but continues to have weight loss.  He has been tolerating his treatment with irinotecan fairly well.  He is here today for evaluation before starting day 8 of cycle #2.   MEDICAL HISTORY: Past Medical History:  Diagnosis Date   Hypertension    Lung cancer ([place])    small cell RUL    ALLERGIES:  has No Known Allergies.  MEDICATIONS:  Current Outpatient Medications  Medication Sig Dispense Refill   acetaminophen (TYLENOL) 650 MG CR tablet Take 650 mg by mouth every 8 (eight) hours as needed for pain.     bisacodyl (DULCOLAX) 5 MG EC tablet Take 5 mg by mouth daily as needed for moderate constipation.     lidocaine-prilocaine (EMLA) cream Apply 1 application topically as needed. (Patient taking differently: Apply 1 application  topically as needed (to access port).) 30 g 2   mirtazapine (REMERON) 15 MG tablet Take 2 tablets (30 mg total) by mouth at bedtime. 30 tablet 2   potassium chloride SA (KLOR-CON M) 20 MEQ tablet Take 1 tablet (20 mEq total) by mouth daily. 7 tablet 0   No current facility-administered medications for this visit.    SURGICAL HISTORY:  Past Surgical History:  Procedure Laterality Date   BRONCHIAL BRUSHINGS  02/08/2020   Procedure: BRONCHIAL BRUSHINGS;  Surgeon: Freddi Starr, MD;  Location: [facility] ENDOSCOPY;  Service: Pulmonary;;   BRONCHIAL NEEDLE ASPIRATION BIOPSY  02/08/2020   Procedure: BRONCHIAL NEEDLE ASPIRATION BIOPSIES;  Surgeon: Freddi Starr, MD;  Location: [facility] ENDOSCOPY;  Service: Pulmonary;;   CHEST TUBE INSERTION N/A 11/06/2021   Procedure: INSERTION PLEURAL DRAINAGE CATHETER;  Surgeon: Garner Nash, DO;  Location: [place];  Service: Pulmonary;  Laterality: N/A;   INCISION AND DRAINAGE ABSCESS Left 05/31/2021   Procedure: Left Hip Excision;  Surgeon: Michael Boston, MD;  Location: [facility] ORS;   Service: General;  Laterality: Left;   IR IMAGING GUIDED PORT INSERTION  12/30/2020   IR IMAGING GUIDED PORT INSERTION  07/19/2021   IR REMOVAL TUN ACCESS W/ PORT W/O FL MOD SED  05/26/2021   KNEE SURGERY Right    SHOULDER SURGERY Left    VIDEO BRONCHOSCOPY WITH ENDOBRONCHIAL ULTRASOUND N/A 02/08/2020   Procedure: VIDEO BRONCHOSCOPY WITH ENDOBRONCHIAL ULTRASOUND;  Surgeon: Freddi Starr, MD;  Location: [place];  Service: Pulmonary;  Laterality: N/A;    REVIEW OF SYSTEMS:  Constitutional: positive for anorexia, fatigue, and weight loss Eyes: negative Ears, nose, mouth, throat, and face: negative Respiratory: positive for cough and dyspnea on exertion Cardiovascular: negative Gastrointestinal: negative Genitourinary:negative Integument/breast: negative Hematologic/lymphatic: negative Musculoskeletal:positive for arthralgias and muscle weakness Neurological: negative Behavioral/Psych: negative Endocrine: negative Allergic/Immunologic: negative   PHYSICAL EXAMINATION: General appearance: alert, cooperative, fatigued, and no distress Head: Normocephalic, without obvious abnormality, atraumatic Neck: no adenopathy, no JVD, supple, symmetrical, trachea midline, and thyroid not enlarged, symmetric, no tenderness/mass/nodules Lymph nodes: Cervical, supraclavicular, and axillary nodes normal. Resp: diminished breath sounds RLL, RML, and RUL and dullness to percussion RLL, RML, and RUL Back: symmetric, no curvature. ROM normal. No CVA tenderness. Cardio: regular rate and rhythm, S1, S2 normal, no murmur, click, rub or gallop GI: soft, non-tender; bowel sounds normal; no masses,  no organomegaly Extremities: extremities normal, atraumatic, no cyanosis or edema Neurologic: Alert and oriented X 3, normal strength and tone. Normal symmetric reflexes. Normal coordination and gait  ECOG PERFORMANCE STATUS: 2 - Symptomatic, <50% confined to bed  Blood pressure 98/67, pulse (!) 126,  temperature 97.8 F (36.6 C), resp. rate 15, weight 153 lb (69.4 kg), SpO2 97 %.  LABORATORY DATA: Lab Results  Component Value Date   WBC 4.7 11/09/2021   HGB 9.9 (L) 11/09/2021   HCT 30.6 (L) 11/09/2021   MCV 93.3 11/09/2021   PLT 176 11/09/2021      Chemistry      Component Value Date/Time   NA 139 11/09/2021 1134   K 4.3 11/09/2021 1134   CL 103 11/09/2021 1134   CO2 29 11/09/2021 1134   BUN 13 11/09/2021 1134   CREATININE 0.69 11/09/2021 1134      Component Value Date/Time   CALCIUM 9.5 11/09/2021 1134   ALKPHOS 55 11/09/2021 1134   AST 18 11/09/2021 1134   ALT 22 11/09/2021 1134   BILITOT 0.4 11/09/2021 1134       RADIOGRAPHIC STUDIES: DG Chest Port 1 View  Result Date: 11/06/2021 CLINICAL DATA:  Congestive heart failure. EXAM: PORTABLE CHEST 1 VIEW COMPARISON:  October 24, 2021. FINDINGS: There is now noted complete opacification of the right hemithorax most likely due to pleural effusion and some degree of atelectasis. Pleural drainage catheter is noted in the right lung base. Right internal jugular Port-A-Cath is unchanged in position. Mediastinal shift to the left is noted. Left lung is clear. IMPRESSION: Complete opacification of the right hemithorax is now noted due to pleural effusion and some degree of atelectasis. Pleural drainage catheter is noted in right lung  base. Electronically Signed   By: Marijo Conception M.D.   On: 11/06/2021 13:43   US Thoracentesis Asp Pleural space w/IMG guide  Result Date: 10/24/2021 INDICATION: Lung cancer with large right pleural effusion. Request therapeutic thoracentesis EXAM: ULTRASOUND GUIDED RIGHT THORACENTESIS MEDICATIONS: 1% lidocaine 10 mL COMPLICATIONS: None immediate. PROCEDURE: An ultrasound guided thoracentesis was thoroughly discussed with the patient and questions answered. The benefits, risks, alternatives and complications were also discussed. The patient understands and wishes to proceed with the procedure.  Written consent was obtained. Ultrasound was performed to localize and mark an adequate pocket of fluid in the right chest. The area was then prepped and draped in the normal sterile fashion. 1% Lidocaine was used for local anesthesia. Under ultrasound guidance a 6 Fr Safe-T-Centesis catheter was introduced. Thoracentesis was performed. The catheter was removed and a dressing applied. FINDINGS: A total of approximately 2.2 L of thin red fluid was removed. IMPRESSION: Successful ultrasound guided right thoracentesis yielding 2.2 L of pleural fluid. Possible small ex vacuo type pneumothorax seen on post-procedure chest x-ray. Patient is asymptomatic. Procedure performed by: Gareth Eagle, PA-C Electronically Signed   By: Aletta Edouard M.D.   On: 10/24/2021 16:16   DG Chest 1 View  Result Date: 10/24/2021 CLINICAL DATA:  Post thoracentesis with 2.2 L removed from the RIGHT chest. EXAM: CHEST  1 VIEW COMPARISON:  October 18, 2021 FINDINGS: RIGHT-sided Port-A-Cath terminates in the mid SVC. Cardiomediastinal contours and hilar structures grossly stable and largely obscured in the RIGHT chest due to persistent large RIGHT-sided pleural effusion which appears to have diminished following thoracentesis. Potential small pneumothorax over the RIGHT upper chest mixed with pleural fluid and pleural disease. LEFT chest is clear. On limited assessment no acute skeletal process. IMPRESSION: 1. Persistent large RIGHT-sided pleural effusion which appears to have diminished following thoracentesis. Potential small pneumothorax over the RIGHT upper chest mixed with pleural fluid and pleural disease. Could consider further evaluation with expiratory view as warranted. If present this likely represents a small ex vacuo type pneumothorax. 2. LEFT chest is clear. These results were called by telephone at the time of interpretation on 10/24/2021 at 3:28 pm to provider [facility] , who verbally acknowledged these results.  Electronically Signed   By: Zetta Bills M.D.   On: 10/24/2021 15:28    ASSESSMENT AND PLAN: This is a very pleasant 66 years old African-American male recently diagnosed with limited stage small cell lung cancer in February 2022.  The patient is currently undergoing treatment according to the [place] clinical trial where the patient would be randomized to treatment with concurrent chemoradiation with cisplatin and/or etoposide versus concurrent chemoradiation with the same regimen in addition to atezolizumab. He is status post 3 cycles of the treatment.  His treatment was discontinued secondary to intolerance and the patient has been in observation since April 2022. He had MRI of the brain performed recently that showed no evidence of metastatic disease to the brain. Repeat CT scan of the chest, abdomen and pelvis unfortunately showed evidence for disease progression involving new mass centered in the superior aspect of the right major fissure, widespread nodularity in the adjacent portion of the upper right lung, multiple new pleural-based lesions in the right hemithorax, new middle mediastinal lymphadenopathy, new hypervascular lesion in the proximal body of the pancreas and new lytic lesions in the parasymphyseal region of the left to pubic bone. His insurance declined a PET scan. The patient is started palliative systemic chemotherapy with carboplatin for  AUC of 5 on day 1, etoposide 100 Mg/M2 on days 1, 2 and 3 with Cosela 4 Myeloprotection before chemotherapy and Imfinzi 1500 Mg IV every 3 weeks with the induction phase followed by maintenance Imfinzi every 4 weeks if the patient has no evidence for disease progression.  He is status post 5 cycles.  Starting from cycle #5 he will be on treatment with Imfinzi every 4 weeks.  His treatment was discontinued after cycle #5 secondary to disease progression. The patient is currently undergoing a third line treatment with Zepzelca (lurbinectedin) 3.2  Mg/M2 every 3 weeks status post 6 cycles.  He was admitted to the hospital after cycle #2 with significant chemotherapy-induced pancytopenia and neutropenic fever as well as left hip subcutaneous abscess.  The patient tolerated his previous treatment with Zepzelca (lurbinectedin) fairly well but this was discontinued secondary to disease progression. He started third line treatment with irinotecan 65 Mg/M2 on days 1 and 8 every 3 weeks status post 1 cycle and day 1 of cycle #2.   The patient has been complaining of worsening dyspnea as well as significant fatigue and weakness.  He was seen at the emergency department yesterday and repeat CT angiogram of the chest showed significant disease progression involving the right lung. I had a lengthy discussion with the patient and his wife today about his current condition and treatment options.  I do not think his current treatment is working and I strongly recommend for the patient to consider palliative care and hospice at this point.  I do not have any good treatment options for him at this point and his condition has been deteriorating significantly. The patient and his wife are in agreement with the current plan. For the chemotherapy-induced anemia, we will arrange for the patient to receive 2 units of PRBCs transfusion the next few days. The patient will continue with his current pain medication. I will see him on as-needed basis at this point. The patient was advised to call if he has any concerning issues in the interval. The patient voices understanding of current disease status and treatment options and is in agreement with the current care plan.  All questions were answered. The patient knows to call the clinic with any problems, questions or concerns. We can certainly see the patient much sooner if necessary.  The total time spent in the appointment was 30 minutes.  Disclaimer: This note was dictated with voice recognition software. Similar  sounding words can inadvertently be transcribed and may not be corrected upon review.

## 2021-11-29 NOTE — Telephone Encounter (Signed)
This nurse received a call from [facility] with [facility], who stated that they were able to schedule an appointment for intake on 11/23 for hospice.  Family would like this provider to the attending. No further questions or concerns.

## 2021-11-29 NOTE — Progress Notes (Signed)
[place]  Telephone:([phone] Fax:(336) [phone]   Name: Adam [name] Date: 11/29/2021 MRN: [id number]  DOB: 18-May-1955  Patient Care Team: de [place], Blondell Reveal, MD as PCP - General (Family Medicine) [name], RN (Inactive) as Registered Nurse Curt Bears, MD as Consulting Physician (Oncology)    INTERVAL HISTORY: Adam [name] is a 66 y.o. 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  Palliative ask to see for symptom management and goals of care.   SOCIAL HISTORY:     reports that he quit smoking about 3 years ago. His smoking use included cigarettes. He has a 20.00 pack-year smoking history. He has never used smokeless tobacco. He reports that he does not currently use alcohol. He reports that he does not use drugs.  ADVANCE DIRECTIVES:  Patient does not have a documented advanced directive.  Is interested in completing.  Education provided, advanced directive packet and clinic dates made available.  CODE STATUS: DNR  PAST MEDICAL HISTORY: Past Medical History:  Diagnosis Date   Hypertension    Lung cancer ([place])    small cell RUL    ALLERGIES:  has No Known Allergies.  MEDICATIONS:  Current Outpatient Medications  Medication Sig Dispense Refill   acetaminophen (TYLENOL) 650 MG CR tablet Take 650 mg by mouth every 8 (eight) hours as needed for pain.     bisacodyl (DULCOLAX) 5 MG EC tablet Take 5 mg by mouth daily as needed for moderate constipation.     lidocaine-prilocaine (EMLA) cream Apply 1 application topically as needed. (Patient taking differently: Apply 1 application  topically as needed (to access port).) 30 g 2   methylPREDNISolone (MEDROL DOSEPAK) 4 MG TBPK tablet Use as instructed. 21 tablet 0   mirtazapine (REMERON) 15 MG tablet Take 2 tablets (30 mg total) by mouth at  bedtime. 30 tablet 2   No current facility-administered medications for this visit.    VITAL SIGNS: BP 107/67 (BP Location: Left Arm, Patient Position: Sitting)   Pulse (!) 116   Temp 98.6 F (37 C)   Resp 20   Ht 5\' 9"  (1.753 m)   Wt 154 lb 3.2 oz (69.9 kg)   SpO2 92%   BMI 22.77 kg/m  Filed Weights   11/29/21 1251  Weight: 154 lb 3.2 oz (69.9 kg)    Estimated body mass index is 22.77 kg/m as calculated from the following:   Height as of this encounter: 5\' 9"  (1.753 m).   Weight as of this encounter: 154 lb 3.2 oz (69.9 kg).   PERFORMANCE STATUS (ECOG) : 0 - Asymptomatic   Physical Exam General: Weak appearing, ill-appearing, in wheelchair  Pulmonary: diminished bilaterally  Extremities: no edema, no joint deformities Skin: no rashes Neurological: AAO x3, mood appropriate   IMPRESSION: [name] presented to clinic for follow-up. His ex-wife, is present with him. He is having increased weakness. In the wheelchair. States he is tired and weak. Was evaluated yesterday in the ER with no evidence of PE. CT scan showed disease progression. Did not wish to be admitted.   Barrie shares that he does not have much of an appetite, sleeps more than he is awake, and over the past week seemed increasingly more fatigued. [name] shares concerns over patient's decline and poor quality of life. He is scheduled for treatment today however given changes in lab,  new CT findings, and overall functional decline I expressed concerns for ability to pursue. This was discussed with his Oncology team. Dr. Julien Nordmann is going to evaluate and understandably consider hospice support.   Mr. Pasqual would not wish for his life to be prolonged.   I spoke with [name] separate from patient. She is tearful expressing her concern for ongoing weakness. She states she is watching him decline. Is appreciative of support and acknowledging that his quality of life has greatly diminished. Emotional support provided. I  empathetically approached discussions regarding focusing on [name]'s comfort allowing him to be comfortable for what time he has left. She expressed understanding and appreciation. [name] expressed her acceptance and not wanting patient to suffer.   Encouraged her to discuss further with Dr. Julien Nordmann and his team for final decisions as planned. She knows we are available for additional support if needed.   PLAN: Infusion discontinued due to disease progression on recent CT. Will see Dr. Julien Nordmann today DNR/DNI Clear in expressed wishes to focus on quality of life. Consider outpatient hospice support pending ongoing discussions.    Patient expressed understanding and was in agreement with this plan. He also understands that He can call the clinic at any time with any questions, concerns, or complaints.     Time Total: 50 min   Visit consisted of counseling and education dealing with the complex and emotionally intense issues of symptom management and palliative care in the setting of serious and potentially life-threatening illness.Greater than 50%  of this time was spent counseling and coordinating care related to the above assessment and plan.  Alda Lea, AGPCNP-BC  Palliative Medicine Team/[facility]

## 2021-12-02 ENCOUNTER — Inpatient Hospital Stay: Payer: Commercial Managed Care - HMO

## 2021-12-02 ENCOUNTER — Other Ambulatory Visit: Payer: Self-pay

## 2021-12-02 DIAGNOSIS — C7951 Secondary malignant neoplasm of bone: Secondary | ICD-10-CM | POA: Diagnosis not present

## 2021-12-02 DIAGNOSIS — C3411 Malignant neoplasm of upper lobe, right bronchus or lung: Secondary | ICD-10-CM

## 2021-12-02 DIAGNOSIS — D649 Anemia, unspecified: Secondary | ICD-10-CM

## 2021-12-02 MED ORDER — HEPARIN SOD (PORK) LOCK FLUSH 100 UNIT/ML IV SOLN
500.0000 [IU] | Freq: Every day | INTRAVENOUS | Status: AC | PRN
  Administered 2021-12-02: 500 [IU]

## 2021-12-02 MED ORDER — HEPARIN SOD (PORK) LOCK FLUSH 100 UNIT/ML IV SOLN: 250.0000 [IU] | INTRAVENOUS | Status: DC | PRN

## 2021-12-02 MED ORDER — SODIUM CHLORIDE 0.9% IV SOLUTION
250.0000 mL | Freq: Once | INTRAVENOUS | Status: AC
  Administered 2021-12-02: 250 mL via INTRAVENOUS

## 2021-12-02 MED ORDER — DIPHENHYDRAMINE HCL 25 MG PO CAPS
25.0000 mg | ORAL_CAPSULE | Freq: Once | ORAL | Status: AC
  Administered 2021-12-02: 25 mg via ORAL
  Filled 2021-12-02 (×2): qty 1

## 2021-12-02 MED ORDER — SODIUM CHLORIDE 0.9% FLUSH
10.0000 mL | INTRAVENOUS | Status: AC | PRN
  Administered 2021-12-02: 10 mL

## 2021-12-02 MED ORDER — ACETAMINOPHEN 325 MG PO TABS
650.0000 mg | ORAL_TABLET | Freq: Once | ORAL | Status: AC
  Administered 2021-12-02: 650 mg via ORAL
  Filled 2021-12-02 (×2): qty 2

## 2021-12-02 NOTE — Patient Instructions (Signed)
Blood Transfusion, Adult A blood transfusion is a procedure in which you receive blood through an IV tube. You may need this procedure because of: A bleeding disorder. An illness. An injury. A surgery. The blood may come from someone else (a donor). You may also be able to donate blood for yourself before a surgery. The blood given in a transfusion may be made up of different types of cells. You may get: Red blood cells. These carry oxygen to the cells in the body. Platelets. These help your blood to clot. Plasma. This is the liquid part of your blood. It carries proteins and other substances through the body. White blood cells. These help you fight infections. If you have a clotting disorder, you may also get other types of blood products. Depending on the type of blood product, this procedure may take 1-4 hours to complete. Tell your doctor about: Any bleeding problems you have. Any reactions you have had during a blood transfusion in the past. Any allergies you have. All medicines you are taking, including vitamins, herbs, eye drops, creams, and over-the-counter medicines. Any surgeries you have had. Any medical conditions you have. Whether you are pregnant or may be pregnant. What are the risks? Talk with your health care provider about risks. The most common problems include: A mild allergic reaction. This includes red, swollen areas of skin (hives) and itching. Fever or chills. This may be the body's response to new blood cells received. This may happen during or up to 4 hours after the transfusion. More serious problems may include: A serious allergic reaction. This includes breathing trouble or swelling around the face and lips. Too much fluid in the lungs. This may cause breathing problems. Lung injury. This causes breathing trouble and low oxygen in the blood. This can happen within hours of the transfusion or days later. Too much iron. This can happen after getting many blood  transfusions over a period of time. An infection or virus passed through the blood. This is rare. Donated blood is carefully tested before it is given. Your body's defense system (immune system) trying to attack the new blood cells. This is rare. Symptoms may include fever, chills, nausea, low blood pressure, and low back or chest pain. Donated cells attacking healthy tissues. This is rare. What happens before the procedure? You will have a blood test to find out your blood type. The test also finds out what type of blood your body will accept and matches it to the donor type. If you are going to have a planned surgery, you may be able to donate your own blood. This may be done in case you need a transfusion. You will have your temperature, blood pressure, and pulse checked. You may receive medicine to help prevent an allergic reaction. This may be done if you have had a reaction to a transfusion before. This medicine may be given to you by mouth or through an IV tube. What happens during the procedure?  An IV tube will be put into one of your veins. The bag of blood will be attached to your IV tube. Then, the blood will enter through your vein. Your temperature, blood pressure, and pulse will be checked often. This is done to find early signs of a transfusion reaction. Tell your nurse right away if you have any of these symptoms: Shortness of breath or trouble breathing. Chest or back pain. Fever or chills. Red, swollen areas of skin or itching. If you have any signs  or symptoms of a reaction, your transfusion will be stopped. You may also be given medicine. When the transfusion is finished, your IV tube will be taken out. Pressure may be put on the IV site for a few minutes. A bandage (dressing) will be put on the IV site. The procedure may vary among doctors and hospitals. What happens after the procedure? You will be monitored until you leave the hospital or clinic. This includes  checking your temperature, blood pressure, pulse, breathing rate, and blood oxygen level. Your blood may be tested to see how you have responded to the transfusion. You may be warmed with fluids or blankets. This is done to keep the temperature of your body normal. If you have your procedure in an outpatient setting, you will be told whom to contact to report any reactions. Where to find more information Visit the [employer]: redcross.org Summary A blood transfusion is a procedure in which you receive blood through an IV tube. The blood you are given may be made up of different blood cells. You may receive red blood cells, platelets, plasma, or white blood cells. Your temperature, blood pressure, and pulse will be checked often. After the procedure, your blood may be tested to see how you have responded. This information is not intended to replace advice given to you by your health care provider. Make sure you discuss any questions you have with your health care provider. Document Revised: 03/24/2021 Document Reviewed: 03/24/2021 Elsevier Patient Education  [place].

## 2021-12-04 ENCOUNTER — Encounter: Payer: Self-pay | Admitting: Emergency Medicine

## 2021-12-04 DIAGNOSIS — C3411 Malignant neoplasm of upper lobe, right bronchus or lung: Secondary | ICD-10-CM

## 2021-12-04 LAB — TYPE AND SCREEN
ABO/RH(D): O POS
Antibody Screen: NEGATIVE
Unit division: 0
Unit division: 0

## 2021-12-04 LAB — BPAM RBC
Blood Product Expiration Date: 202312202359
Blood Product Expiration Date: 202312202359
ISSUE DATE / TIME: 202311250843
ISSUE DATE / TIME: 202311250843
Unit Type and Rh: 5100
Unit Type and Rh: 5100

## 2021-12-04 NOTE — Research (Signed)
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  All AE's that occurred during this visit are expected and unrelated to study per MD [facility], no action or reporting required at this time.  Patient is on follow up portion of non-intervention arm of study.  Wells Guiles 'Learta CoddingNeysa Bonito, RN, BSN Clinical Research Nurse I 12/04/21 9:56 AM

## 2021-12-05 ENCOUNTER — Telehealth: Payer: Self-pay | Admitting: Medical Oncology

## 2021-12-07 SURGERY — Surgical Case
Anesthesia: *Unknown

## 2021-12-08 NOTE — Telephone Encounter (Signed)
Adam [name] died this morning at [phone].

## 2021-12-08 DEATH — deceased

## 2021-12-11 ENCOUNTER — Ambulatory Visit: Payer: Commercial Managed Care - HMO | Admitting: Pulmonary Disease

## 2021-12-13 ENCOUNTER — Ambulatory Visit: Payer: Commercial Managed Care - HMO

## 2021-12-13 ENCOUNTER — Inpatient Hospital Stay: Payer: Commercial Managed Care - HMO

## 2021-12-13 ENCOUNTER — Other Ambulatory Visit: Payer: Commercial Managed Care - HMO

## 2021-12-13 ENCOUNTER — Inpatient Hospital Stay: Payer: Commercial Managed Care - HMO | Admitting: Internal Medicine

## 2021-12-20 ENCOUNTER — Ambulatory Visit: Payer: Commercial Managed Care - HMO

## 2021-12-20 ENCOUNTER — Inpatient Hospital Stay: Payer: Commercial Managed Care - HMO

## 2021-12-20 ENCOUNTER — Ambulatory Visit: Payer: Commercial Managed Care - HMO | Admitting: Physician Assistant

## 2021-12-20 ENCOUNTER — Other Ambulatory Visit: Payer: Commercial Managed Care - HMO

## 2021-12-28 ENCOUNTER — Ambulatory Visit: Payer: Commercial Managed Care - HMO | Admitting: Emergency Medicine

## 2022-01-03 ENCOUNTER — Ambulatory Visit (HOSPITAL_BASED_OUTPATIENT_CLINIC_OR_DEPARTMENT_OTHER): Payer: Commercial Managed Care - HMO | Admitting: Family Medicine

## 2022-01-04 ENCOUNTER — Other Ambulatory Visit: Payer: Commercial Managed Care - HMO

## 2022-01-04 ENCOUNTER — Ambulatory Visit: Payer: Commercial Managed Care - HMO

## 2022-01-04 ENCOUNTER — Ambulatory Visit: Payer: Commercial Managed Care - HMO | Admitting: Physician Assistant

## 2022-01-11 ENCOUNTER — Ambulatory Visit: Payer: Commercial Managed Care - HMO

## 2022-01-11 ENCOUNTER — Other Ambulatory Visit: Payer: Commercial Managed Care - HMO

## 2022-01-11 ENCOUNTER — Ambulatory Visit: Payer: Commercial Managed Care - HMO | Admitting: Physician Assistant

## 2022-04-09 NOTE — Progress Notes (Signed)
Patient is deceased.  Closing encounter as it was never properly closed. Gardiner Rhyme, RN

## 2022-11-28 NOTE — Telephone Encounter (Signed)
Telephone call  

## 2023-01-08 IMAGING — CT CT ABD-PELV W/ CM
2 of 6 series · 7 of 46 positions shown, 8 images · IV contrast (agent unspecified)
Comparison: 02/09/2021
COMPARISON: 02/09/2021
COMPARISON: 02/09/2021

Addendum:
CLINICAL DATA: Metastatic small-cell lung cancer restaging, assess
treatment response * Tracking Code: BO *

EXAM:
CT CHEST, ABDOMEN, AND PELVIS WITH CONTRAST
TECHNIQUE: Multidetector CT imaging of the chest, abdomen and pelvis was
performed following the standard protocol during bolus
administration of intravenous contrast.

[Series 6: cap with 2.00 br40 s3 cor · coronal · 0.82mm/px · 3 of 145 slices shown]
[im 49/145  soft-tissue]
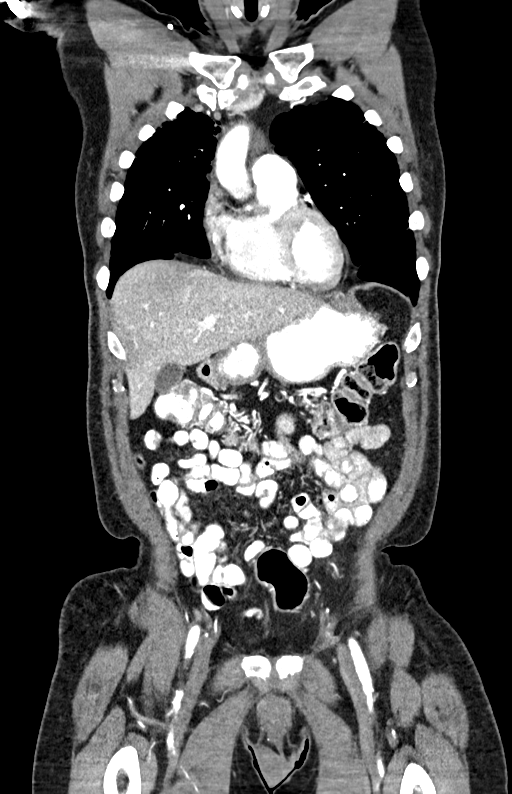
[im 65/145  soft-tissue]
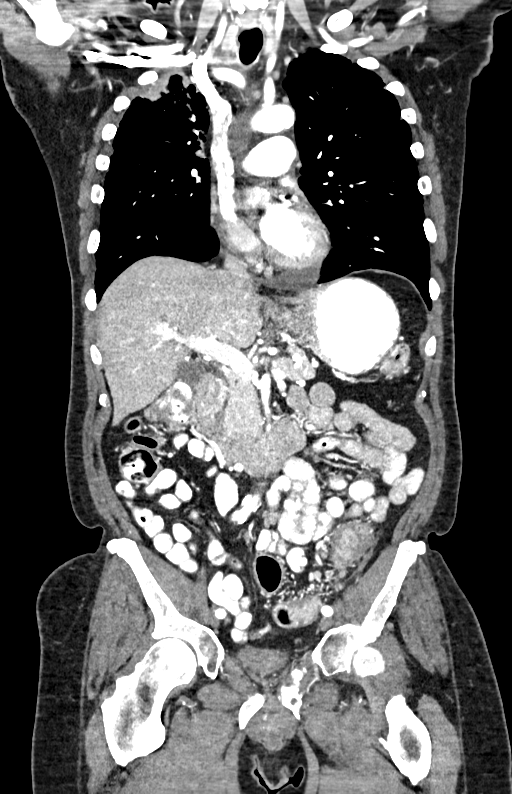
[im 81/145  soft-tissue]
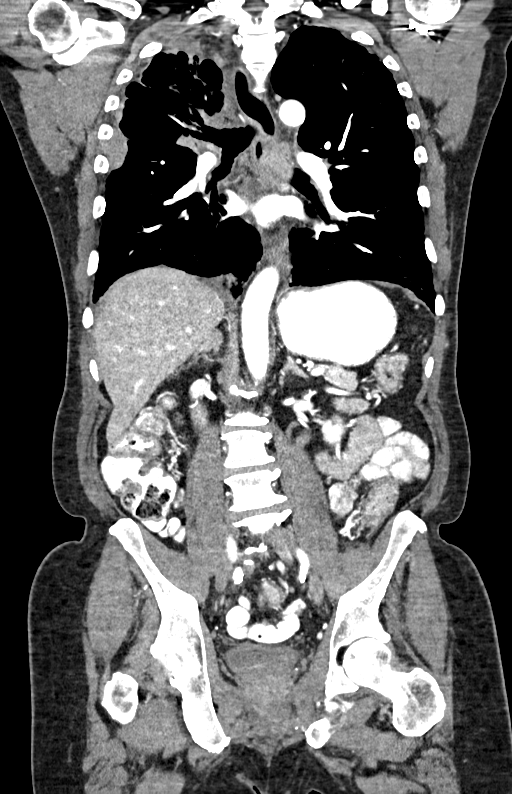

[Series 10: cap with 1.00 br40 s3 super d · axial · 0.75mm/px · z∈[+1222,+1692]mm · 4 of 809 slices shown, 5 images]
[im 111/809  soft-tissue]
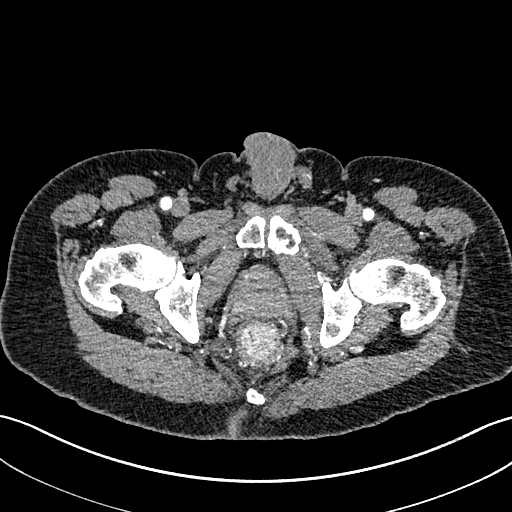
[im 111/809  bone]
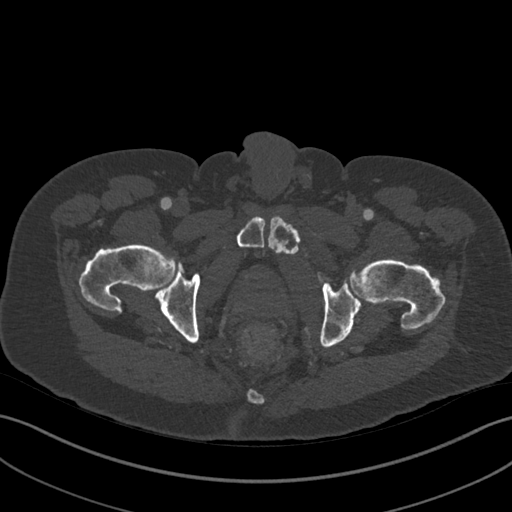
[im 294/809  soft-tissue]
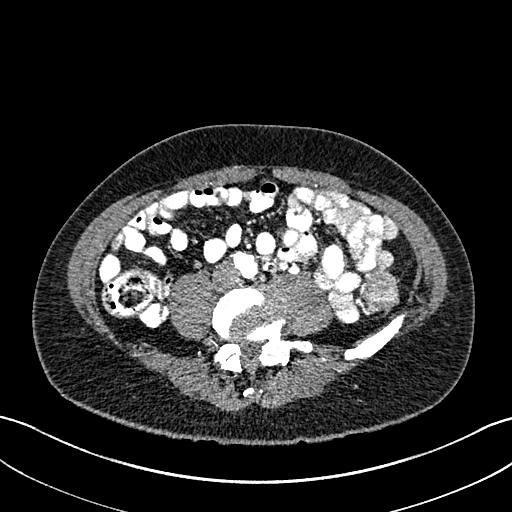
[im 515/809  soft-tissue]
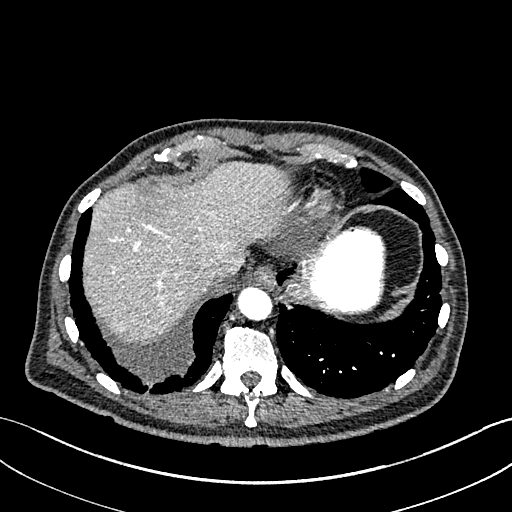
[im 698/809  soft-tissue]
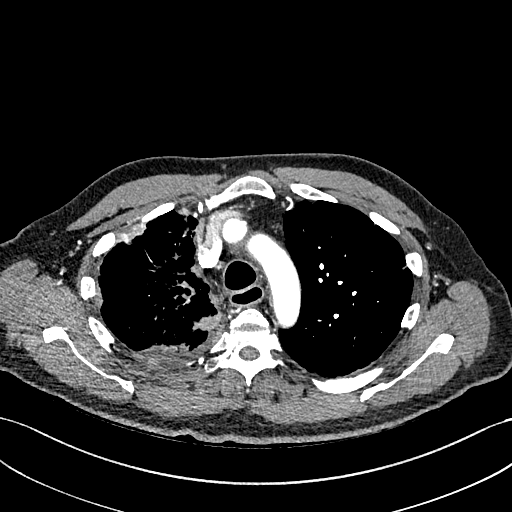

[7 of 46 positions shown; findings below may reference images not displayed]

RADIATION DOSE REDUCTION: This exam was performed according to the
departmental dose-optimization program which includes automated
exposure control, adjustment of the mA and/or kV according to
patient size and/or use of iterative reconstruction technique.

CONTRAST:  100mL EBBAQC-YQQ IOPAMIDOL (EBBAQC-YQQ) INJECTION 61%,
additional oral enteric contrast
FINDINGS: CT CHEST FINDINGS

Cardiovascular: Right chest port catheter. Aortic atherosclerosis.
Normal heart size. Three-vessel coronary artery calcifications. No
pericardial effusion.

Mediastinum/Nodes: Unchanged post treatment appearance of
ill-defined soft tissue anterior to the trachea, measuring
approximately 4.2 x 1.9 cm (series 2, image 22). Interval
enlargement of a low left hilar or periaortic bulky lymph node or
soft tissue mass, lying posterior to the left mainstem bronchus and
overlying the aorta, measuring 5.0 x 3.5 cm, previously 3.8 x 2.6 cm
(series 2, image 29). Slight interval enlargement of left superior
mediastinal/supraclavicular lymph nodes, measuring up to 1.5 x
cm, previously 1.2 x 0.8 cm (series 2, image 11). Interval
enlargement of necrotic appearing right superior mediastinal nodes
measuring up to 2.0 x 1.8 cm, previously subcentimeter (series 2,
image 9). Thyroid gland, trachea, and esophagus demonstrate no
significant findings.

Lungs/Pleura: Small right pleural effusion, slightly increased
compared to prior examination. Interval enlargement of multiple
right-sided pleural soft tissue masses, index mass overlying the
right lateral fifth rib, measuring 3.8 x 1.9 cm, previously 2.9 x
1.4 cm (series 2, image 24). Additional mass adjacent to the right
hilum, appearing to arise from the right major fissure adjacent to
the superior segment right lower lobe is likewise enlarged,
measuring 3.8 x 2.5 cm, previously 3.1 x 1.4 cm (series 2, image
24). Underlying post radiation fibrosis of the perihilar right lung
is unchanged. Moderate underlying centrilobular and paraseptal
emphysema.

Musculoskeletal: No chest wall mass.

CT ABDOMEN PELVIS FINDINGS

Hepatobiliary: No solid liver abnormality is seen. No gallstones,
gallbladder wall thickening, or biliary dilatation.

Pancreas: Unremarkable. No pancreatic ductal dilatation or
surrounding inflammatory changes.

Spleen: Normal in size without significant abnormality.

Adrenals/Urinary Tract: Adrenal glands are unremarkable.
Nonobstructive calculus of the lateral midportion of the left
kidney. No right-sided calculi, ureteral calculi, or hydronephrosis.
Bladder is unremarkable.

Stomach/Bowel: Stomach is within normal limits. Appendix appears
normal. No evidence of bowel wall thickening, distention, or
inflammatory changes.

Vascular/Lymphatic: Aortic atherosclerosis. No enlarged abdominal or
pelvic lymph nodes.

Reproductive: No mass or other abnormality.

Other: No abdominal wall hernia or abnormality. No ascites.

Musculoskeletal: No acute osseous findings. Unchanged lytic
metastases of the left superior pubic ramus abutting the pubic
symphysis, once again demonstrating a nonacute, nondisplaced
pathologic fracture (series 6, image 61), as well as the left
ischial ramus (series 6, image 89).
IMPRESSION: 1. Interval enlargement of a bulky low left hilar or periaortic
lymph node or soft tissue mass, as well as smaller lymph nodes
within the superior mediastinum.
2. Interval enlargement of multiple right-sided masses and slightly
increased right pleural effusion.
3. Findings are consistent with progression of metastatic disease.
4. Unchanged lytic metastases of the left hemipelvis. No new osseous
lesions.
5. Emphysema.
6. Coronary artery disease.

Aortic Atherosclerosis (QIVGN-W1E.E).

ADDENDUM:
Addendum is requested for RECIST criteria interpretation, which was
not indicated at the time of initial interpretation.

Target lesions:

1. Right apical nodule measuring 1.5 cm, previously 1.5 cm (series
4, image 33)

2. Posterior right hilar soft tissue mass measuring 5.4 cm,
previously 4.1 cm (series 2, image 23).

3. Left hilar or periaortic bulky lymph node or soft tissue mass
measuring 5.0 cm, previously 3.8 cm (series 2, image 28).

4. Subpleural nodule of the peripheral right upper lobe measuring
3.6 cm, previously 3.0 cm (series 2, image 24).

SLD = 15.5 cm, previously 12.4 cm.

Progression of disease (PD).

ADDENDUM:
Target lesion addendum

Lesion 2. Ill-defined paratracheal tissue measures 2.9 x 0.8 cm
compared to 4.1 x 1.7 cm (image 23/series 2).

Lesion 3.  Described as lesion 2 in prior addendum

*** End of Addendum ***
Addendum:
RADIATION DOSE REDUCTION: This exam was performed according to the
departmental dose-optimization program which includes automated
exposure control, adjustment of the mA and/or kV according to
patient size and/or use of iterative reconstruction technique.

CONTRAST:  100mL EBBAQC-YQQ IOPAMIDOL (EBBAQC-YQQ) INJECTION 61%,
additional oral enteric contrast
FINDINGS: CT CHEST FINDINGS

Cardiovascular: Right chest port catheter. Aortic atherosclerosis.
Normal heart size. Three-vessel coronary artery calcifications. No
pericardial effusion.

Mediastinum/Nodes: Unchanged post treatment appearance of
ill-defined soft tissue anterior to the trachea, measuring
approximately 4.2 x 1.9 cm (series 2, image 22). Interval
enlargement of a low left hilar or periaortic bulky lymph node or
soft tissue mass, lying posterior to the left mainstem bronchus and
overlying the aorta, measuring 5.0 x 3.5 cm, previously 3.8 x 2.6 cm
(series 2, image 29). Slight interval enlargement of left superior
mediastinal/supraclavicular lymph nodes, measuring up to 1.5 x
cm, previously 1.2 x 0.8 cm (series 2, image 11). Interval
enlargement of necrotic appearing right superior mediastinal nodes
measuring up to 2.0 x 1.8 cm, previously subcentimeter (series 2,
image 9). Thyroid gland, trachea, and esophagus demonstrate no
significant findings.

Lungs/Pleura: Small right pleural effusion, slightly increased
compared to prior examination. Interval enlargement of multiple
right-sided pleural soft tissue masses, index mass overlying the
right lateral fifth rib, measuring 3.8 x 1.9 cm, previously 2.9 x
1.4 cm (series 2, image 24). Additional mass adjacent to the right
hilum, appearing to arise from the right major fissure adjacent to
the superior segment right lower lobe is likewise enlarged,
measuring 3.8 x 2.5 cm, previously 3.1 x 1.4 cm (series 2, image
24). Underlying post radiation fibrosis of the perihilar right lung
is unchanged. Moderate underlying centrilobular and paraseptal
emphysema.

Musculoskeletal: No chest wall mass.

CT ABDOMEN PELVIS FINDINGS

Hepatobiliary: No solid liver abnormality is seen. No gallstones,
gallbladder wall thickening, or biliary dilatation.

Pancreas: Unremarkable. No pancreatic ductal dilatation or
surrounding inflammatory changes.

Spleen: Normal in size without significant abnormality.

Adrenals/Urinary Tract: Adrenal glands are unremarkable.
Nonobstructive calculus of the lateral midportion of the left
kidney. No right-sided calculi, ureteral calculi, or hydronephrosis.
Bladder is unremarkable.

Stomach/Bowel: Stomach is within normal limits. Appendix appears
normal. No evidence of bowel wall thickening, distention, or
inflammatory changes.

Vascular/Lymphatic: Aortic atherosclerosis. No enlarged abdominal or
pelvic lymph nodes.

Reproductive: No mass or other abnormality.

Other: No abdominal wall hernia or abnormality. No ascites.

Musculoskeletal: No acute osseous findings. Unchanged lytic
metastases of the left superior pubic ramus abutting the pubic
symphysis, once again demonstrating a nonacute, nondisplaced
pathologic fracture (series 6, image 61), as well as the left
ischial ramus (series 6, image 89).
IMPRESSION: 1. Interval enlargement of a bulky low left hilar or periaortic
lymph node or soft tissue mass, as well as smaller lymph nodes
within the superior mediastinum.
2. Interval enlargement of multiple right-sided masses and slightly
increased right pleural effusion.
3. Findings are consistent with progression of metastatic disease.
4. Unchanged lytic metastases of the left hemipelvis. No new osseous
lesions.
5. Emphysema.
6. Coronary artery disease.

Aortic Atherosclerosis (QIVGN-W1E.E).

ADDENDUM:
Addendum is requested for RECIST criteria interpretation, which was
not indicated at the time of initial interpretation.

Target lesions:

1. Right apical nodule measuring 1.5 cm, previously 1.5 cm (series
4, image 33)

2. Posterior right hilar soft tissue mass measuring 5.4 cm,
previously 4.1 cm (series 2, image 23).

3. Left hilar or periaortic bulky lymph node or soft tissue mass
measuring 5.0 cm, previously 3.8 cm (series 2, image 28).

4. Subpleural nodule of the peripheral right upper lobe measuring
3.6 cm, previously 3.0 cm (series 2, image 24).

SLD = 15.5 cm, previously 12.4 cm.

Progression of disease (PD).

*** End of Addendum ***
RADIATION DOSE REDUCTION: This exam was performed according to the
departmental dose-optimization program which includes automated
exposure control, adjustment of the mA and/or kV according to
patient size and/or use of iterative reconstruction technique.

CONTRAST:  100mL EBBAQC-YQQ IOPAMIDOL (EBBAQC-YQQ) INJECTION 61%,
additional oral enteric contrast
FINDINGS: CT CHEST FINDINGS

Cardiovascular: Right chest port catheter. Aortic atherosclerosis.
Normal heart size. Three-vessel coronary artery calcifications. No
pericardial effusion.

Mediastinum/Nodes: Unchanged post treatment appearance of
ill-defined soft tissue anterior to the trachea, measuring
approximately 4.2 x 1.9 cm (series 2, image 22). Interval
enlargement of a low left hilar or periaortic bulky lymph node or
soft tissue mass, lying posterior to the left mainstem bronchus and
overlying the aorta, measuring 5.0 x 3.5 cm, previously 3.8 x 2.6 cm
(series 2, image 29). Slight interval enlargement of left superior
mediastinal/supraclavicular lymph nodes, measuring up to 1.5 x
cm, previously 1.2 x 0.8 cm (series 2, image 11). Interval
enlargement of necrotic appearing right superior mediastinal nodes
measuring up to 2.0 x 1.8 cm, previously subcentimeter (series 2,
image 9). Thyroid gland, trachea, and esophagus demonstrate no
significant findings.

Lungs/Pleura: Small right pleural effusion, slightly increased
compared to prior examination. Interval enlargement of multiple
right-sided pleural soft tissue masses, index mass overlying the
right lateral fifth rib, measuring 3.8 x 1.9 cm, previously 2.9 x
1.4 cm (series 2, image 24). Additional mass adjacent to the right
hilum, appearing to arise from the right major fissure adjacent to
the superior segment right lower lobe is likewise enlarged,
measuring 3.8 x 2.5 cm, previously 3.1 x 1.4 cm (series 2, image
24). Underlying post radiation fibrosis of the perihilar right lung
is unchanged. Moderate underlying centrilobular and paraseptal
emphysema.

Musculoskeletal: No chest wall mass.

CT ABDOMEN PELVIS FINDINGS

Hepatobiliary: No solid liver abnormality is seen. No gallstones,
gallbladder wall thickening, or biliary dilatation.

Pancreas: Unremarkable. No pancreatic ductal dilatation or
surrounding inflammatory changes.

Spleen: Normal in size without significant abnormality.

Adrenals/Urinary Tract: Adrenal glands are unremarkable.
Nonobstructive calculus of the lateral midportion of the left
kidney. No right-sided calculi, ureteral calculi, or hydronephrosis.
Bladder is unremarkable.

Stomach/Bowel: Stomach is within normal limits. Appendix appears
normal. No evidence of bowel wall thickening, distention, or
inflammatory changes.

Vascular/Lymphatic: Aortic atherosclerosis. No enlarged abdominal or
pelvic lymph nodes.

Reproductive: No mass or other abnormality.

Other: No abdominal wall hernia or abnormality. No ascites.

Musculoskeletal: No acute osseous findings. Unchanged lytic
metastases of the left superior pubic ramus abutting the pubic
symphysis, once again demonstrating a nonacute, nondisplaced
pathologic fracture (series 6, image 61), as well as the left
ischial ramus (series 6, image 89).
IMPRESSION: 1. Interval enlargement of a bulky low left hilar or periaortic
lymph node or soft tissue mass, as well as smaller lymph nodes
within the superior mediastinum.
2. Interval enlargement of multiple right-sided masses and slightly
increased right pleural effusion.
3. Findings are consistent with progression of metastatic disease.
4. Unchanged lytic metastases of the left hemipelvis. No new osseous
lesions.
5. Emphysema.
6. Coronary artery disease.

Aortic Atherosclerosis (QIVGN-W1E.E).

## 2023-02-17 IMAGING — DX DG FOREARM 2V*L*
2 series · 2 of 2 positions shown · non-contrast
Comparison: None Available.

CLINICAL DATA: Left forearm wound after fall several weeks ago.

EXAM:
LEFT FOREARM - 2 VIEW

[forearm ap]
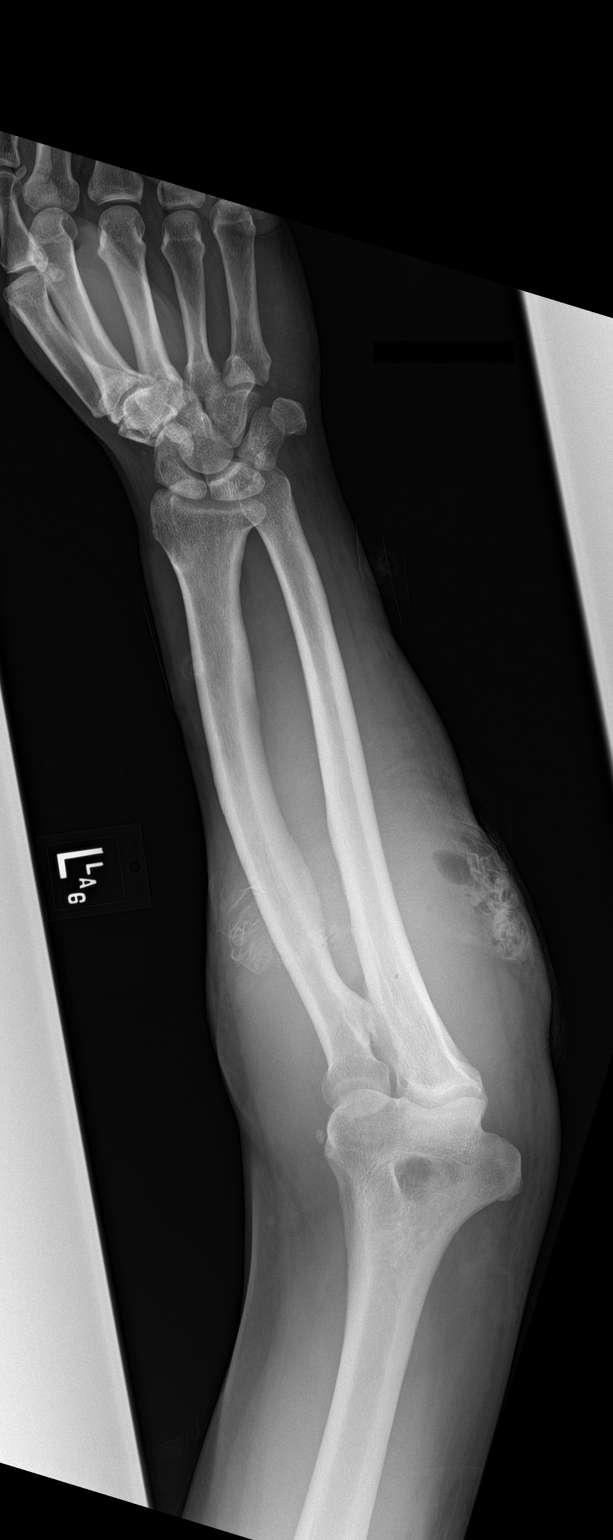

[forearm lat]
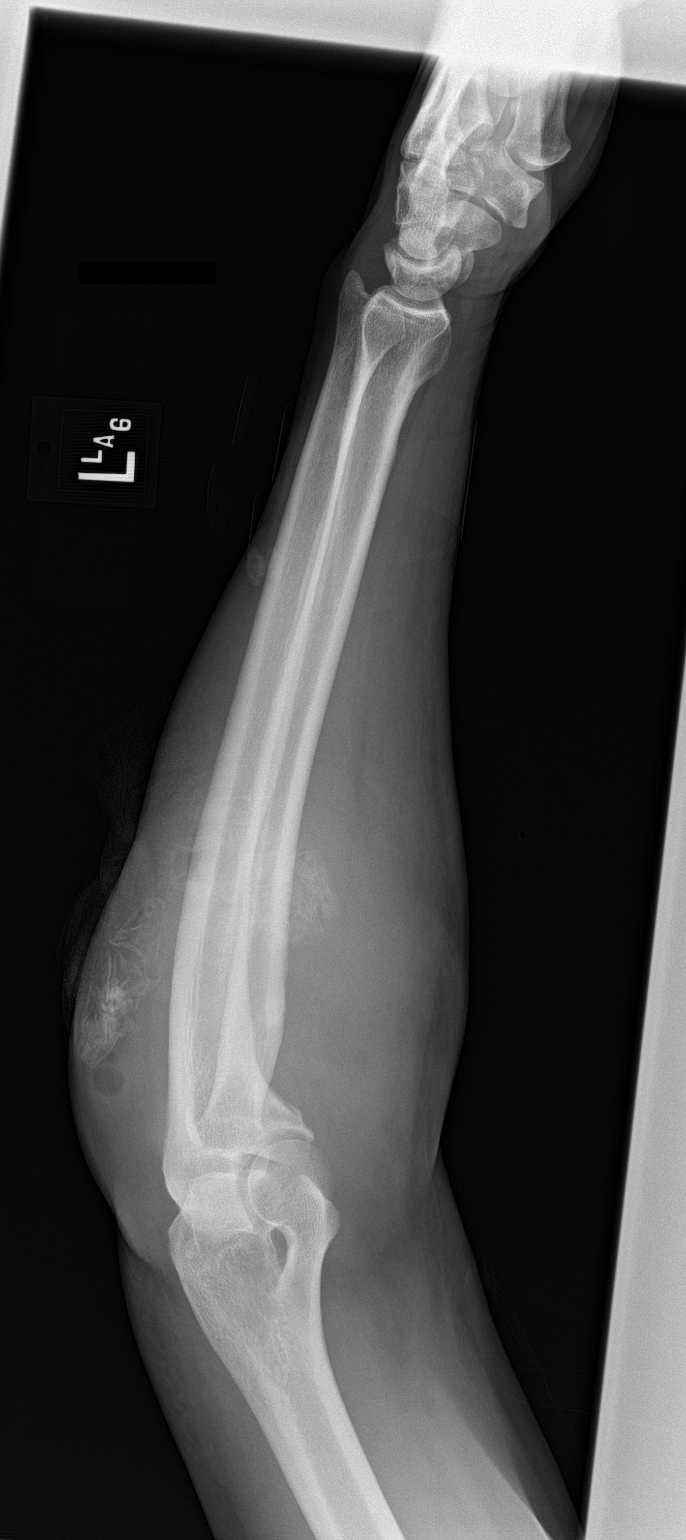

[2 of 2 positions shown; findings below may reference images not displayed]

FINDINGS: There is no evidence of fracture or other focal bone lesions. Large
amount of soft tissue gas is seen around proximal forearm. Some high
density material is noted within the soft tissues which may
represent either surgical packing or other foreign body.
IMPRESSION: No fracture or dislocation is noted. Large amount of soft tissue gas
seen in proximal forearm consistent with laceration or other wounds.
High density material is seen in these areas which may represent
surgical packing or other foreign body; clinical correlation is
recommended
